# Patient Record
Sex: Female | Born: 1950 | Race: White | Hispanic: No | Smoking: Never smoker
Health system: Southern US, Community
[De-identification: ages and names within clinical notes are randomized; demographics above are authoritative.]

## PROBLEM LIST (undated history)

## (undated) DIAGNOSIS — R296 Repeated falls: Secondary | ICD-10-CM

## (undated) DIAGNOSIS — R413 Other amnesia: Secondary | ICD-10-CM

## (undated) DIAGNOSIS — F32A Depression, unspecified: Secondary | ICD-10-CM

## (undated) DIAGNOSIS — F329 Major depressive disorder, single episode, unspecified: Secondary | ICD-10-CM

## (undated) DIAGNOSIS — G4700 Insomnia, unspecified: Secondary | ICD-10-CM

## (undated) DIAGNOSIS — R404 Transient alteration of awareness: Secondary | ICD-10-CM

## (undated) DIAGNOSIS — K219 Gastro-esophageal reflux disease without esophagitis: Secondary | ICD-10-CM

## (undated) DIAGNOSIS — F419 Anxiety disorder, unspecified: Secondary | ICD-10-CM

## (undated) DIAGNOSIS — E7800 Pure hypercholesterolemia, unspecified: Secondary | ICD-10-CM

## (undated) DIAGNOSIS — M1990 Unspecified osteoarthritis, unspecified site: Secondary | ICD-10-CM

## (undated) HISTORY — DX: Major depressive disorder, single episode, unspecified: F32.9

## (undated) HISTORY — PX: COSMETIC SURGERY: SHX468

## (undated) HISTORY — DX: Other amnesia: R41.3

## (undated) HISTORY — DX: Unspecified osteoarthritis, unspecified site: M19.90

## (undated) HISTORY — DX: Transient alteration of awareness: R40.4

## (undated) HISTORY — PX: OTHER SURGICAL HISTORY: SHX169

## (undated) HISTORY — DX: Anxiety disorder, unspecified: F41.9

## (undated) HISTORY — DX: Gastro-esophageal reflux disease without esophagitis: K21.9

## (undated) HISTORY — DX: Repeated falls: R29.6

---

## 2001-02-11 ENCOUNTER — Other Ambulatory Visit: Admission: RE | Admit: 2001-02-11 | Discharge: 2001-02-11 | Payer: Self-pay | Admitting: Family Medicine

## 2001-08-01 ENCOUNTER — Encounter: Admission: RE | Admit: 2001-08-01 | Discharge: 2001-08-15 | Payer: Self-pay | Admitting: Orthopedic Surgery

## 2002-05-21 ENCOUNTER — Other Ambulatory Visit: Admission: RE | Admit: 2002-05-21 | Discharge: 2002-05-21 | Payer: Self-pay | Admitting: Family Medicine

## 2003-07-05 ENCOUNTER — Ambulatory Visit (HOSPITAL_BASED_OUTPATIENT_CLINIC_OR_DEPARTMENT_OTHER): Admission: RE | Admit: 2003-07-05 | Discharge: 2003-07-05 | Payer: Self-pay | Admitting: Family Medicine

## 2003-07-07 ENCOUNTER — Other Ambulatory Visit: Admission: RE | Admit: 2003-07-07 | Discharge: 2003-07-07 | Payer: Self-pay | Admitting: Family Medicine

## 2004-04-11 ENCOUNTER — Inpatient Hospital Stay (HOSPITAL_COMMUNITY): Admission: EM | Admit: 2004-04-11 | Discharge: 2004-04-14 | Payer: Self-pay | Admitting: Emergency Medicine

## 2006-12-13 ENCOUNTER — Inpatient Hospital Stay (HOSPITAL_COMMUNITY): Admission: EM | Admit: 2006-12-13 | Discharge: 2006-12-18 | Payer: Self-pay | Admitting: Emergency Medicine

## 2007-09-01 ENCOUNTER — Emergency Department (HOSPITAL_COMMUNITY): Admission: EM | Admit: 2007-09-01 | Discharge: 2007-09-01 | Payer: Self-pay | Admitting: Emergency Medicine

## 2007-11-25 ENCOUNTER — Emergency Department (HOSPITAL_COMMUNITY): Admission: EM | Admit: 2007-11-25 | Discharge: 2007-11-25 | Payer: Self-pay | Admitting: Emergency Medicine

## 2008-02-11 ENCOUNTER — Ambulatory Visit (HOSPITAL_COMMUNITY): Admission: RE | Admit: 2008-02-11 | Discharge: 2008-02-11 | Payer: Self-pay | Admitting: Family Medicine

## 2009-01-30 ENCOUNTER — Emergency Department (HOSPITAL_COMMUNITY): Admission: EM | Admit: 2009-01-30 | Discharge: 2009-01-30 | Payer: Self-pay | Admitting: Emergency Medicine

## 2010-01-12 ENCOUNTER — Encounter: Payer: Self-pay | Admitting: Internal Medicine

## 2010-01-18 ENCOUNTER — Encounter: Payer: Self-pay | Admitting: Internal Medicine

## 2010-02-08 ENCOUNTER — Emergency Department (HOSPITAL_COMMUNITY): Admission: EM | Admit: 2010-02-08 | Discharge: 2010-02-09 | Payer: Self-pay | Admitting: Emergency Medicine

## 2010-03-02 ENCOUNTER — Emergency Department (HOSPITAL_COMMUNITY): Admission: EM | Admit: 2010-03-02 | Discharge: 2010-03-02 | Payer: Self-pay | Admitting: Emergency Medicine

## 2010-03-13 DIAGNOSIS — R269 Unspecified abnormalities of gait and mobility: Secondary | ICD-10-CM

## 2010-03-13 DIAGNOSIS — G4733 Obstructive sleep apnea (adult) (pediatric): Secondary | ICD-10-CM

## 2010-03-13 DIAGNOSIS — IMO0001 Reserved for inherently not codable concepts without codable children: Secondary | ICD-10-CM

## 2010-03-13 DIAGNOSIS — E785 Hyperlipidemia, unspecified: Secondary | ICD-10-CM

## 2010-03-13 DIAGNOSIS — E7800 Pure hypercholesterolemia, unspecified: Secondary | ICD-10-CM

## 2010-03-13 DIAGNOSIS — Z8659 Personal history of other mental and behavioral disorders: Secondary | ICD-10-CM

## 2010-03-13 DIAGNOSIS — K219 Gastro-esophageal reflux disease without esophagitis: Secondary | ICD-10-CM

## 2010-03-13 DIAGNOSIS — F32A Depression, unspecified: Secondary | ICD-10-CM | POA: Insufficient documentation

## 2010-03-13 DIAGNOSIS — I959 Hypotension, unspecified: Secondary | ICD-10-CM | POA: Insufficient documentation

## 2010-03-13 DIAGNOSIS — R0789 Other chest pain: Secondary | ICD-10-CM | POA: Insufficient documentation

## 2010-03-13 DIAGNOSIS — R55 Syncope and collapse: Secondary | ICD-10-CM

## 2010-03-13 DIAGNOSIS — B1910 Unspecified viral hepatitis B without hepatic coma: Secondary | ICD-10-CM | POA: Insufficient documentation

## 2010-04-05 ENCOUNTER — Ambulatory Visit: Payer: Self-pay | Admitting: Internal Medicine

## 2010-05-25 ENCOUNTER — Encounter: Payer: Self-pay | Admitting: Internal Medicine

## 2010-06-25 ENCOUNTER — Emergency Department (HOSPITAL_COMMUNITY): Admission: EM | Admit: 2010-06-25 | Discharge: 2010-06-25 | Payer: Self-pay | Admitting: Emergency Medicine

## 2010-08-11 ENCOUNTER — Ambulatory Visit: Payer: Self-pay | Admitting: Psychology

## 2011-01-18 NOTE — Letter (Signed)
Summary: [facility] Office Note  [facility] Office Note   Imported By: Roderic Ovens 04/18/2010 15:12:14  _____________________________________________________________________  External Attachment:    Type:   Image     Comment:   External Document

## 2011-01-18 NOTE — Letter (Signed)
Summary: [facility] Office Note  [facility] Office Note   Imported By: Roderic Ovens 04/24/2010 12:55:11  _____________________________________________________________________  External Attachment:    Type:   Image     Comment:   External Document

## 2011-01-18 NOTE — Assessment & Plan Note (Signed)
Summary: nep/POTS disease/jml   Visit Type:  Initial Consult Primary Provider:  Kristopher Oppenheim, MD   History of Present Illness: Kendra Simmons is seen at the request of Dr. love because of recurrent falls.  She has a past medical history notable for panic attacks and depression. About 3 years ago she started having falls. Difficulty associated with injury. They're associated with a recognizable albeit very brief prodrome on most but not all occasions. She characterizes this as a wobble. Once she has fallen she is apparently if she has not injured herself able to quickly get herself up. Occasionally she has had difficulty standing after a fall. She denies at the phenomenon of diaphoresis nausea or flushing. She does have early a.m. orthostatic intolerance. She has dizziness after taking her shower which he does on the seat. She does not notice any change in her orthostatic tolerance or propensity to fall based on ambient temperature.  She has had no cardiac evaluation to date. She has seen Dr. love on multiple occasions; MRI reports are negative  Current Medications (verified): 1)  Fluoxetine Hcl 40 Mg Caps (Fluoxetine Hcl) .... Take One Tablet By Mouth Once Daily. 2)  Lorazepam 1 Mg Tabs (Lorazepam) .[name].. 1 Tab Three Times A Day 3)  Imipramine Hcl 25 Mg Tabs (Imipramine Hcl) .... 4 Tablets Once Daily 4)  Lipitor 80 Mg Tabs (Atorvastatin Calcium) .... Take One Tablet By Mouth Daily. 5)  Omeprazole 20 Mg Cpdr (Omeprazole) .[name].. 1 Tablet Two Times A Day 6)  Abilify 5 Mg Tabs (Aripiprazole) .[name].. 1 Tab At Bedtime  Allergies (verified): No Known Drug Allergies  Past History:  Past Medical History: Last updated: 03/13/2010 Current Problems:  CHEST DISCOMFORT (ICD-786.59) DYSLIPIDEMIA (ICD-272.4) HYPOTENSION (ICD-458.9) HEPATITIS B (ICD-070.30) GERD (ICD-530.81) SLEEP APNEA, OBSTRUCTIVE (ICD-327.23) HYPERCHOLESTEROLEMIA (ICD-272.0) GAIT DISTURBANCE (ICD-781.2) SYNCOPE  (ICD-780.2) DEPRESSION, HX OF (ICD-V11.8) MYALGIA (ICD-729.1)  Vital Signs:  Patient profile:   60 year old female Height:      64 inches Weight:      171 pounds BMI:     29.46 Pulse rate:   91 / minute Pulse (ortho):   100 / minute BP standing:   87 / 61  Vitals Entered By: Laurance Flatten CMA (April 05, 2010 2:57 PM)  Serial Vital Signs/Assessments:  Time      Position  BP       Pulse  Resp  Temp     By           Lying RA  96/71    86                    Jewel Hardy CMA           Sitting   104/73   96                    Jewel Hardy CMA           Standing  87/61    100                   Jewel Hardy CMA           Standing  100/76   108                   Jewel Hardy CMA           Standing  84/74    100  Jewel Hardy CMA  Comments: dizziness with reading #3 By: Laurance Flatten CMA    Physical Exam  General:  Well developed, well nourished, in no acute distress. Head:  normal HEENT Neck:  supple without thyromegaly carotid sinus massage was negative. Carotids were brisk and without bruits Chest Wall:  no deformities or breast masses noted Lungs:  clear Heart:  regular rate and rhythm without murmurs or gallops Abdomen:  soft nontender without hepatomegaly or midline pulsation Msk:  Back normal, normal gait. Muscle strength and tone normal. she is wearing a ankle splint on her right side and a bandage on her left knee Pulses:  distal pulses are intact Extremities:  there is no clubbing cyanosis or edema Neurologic:   alert and oriented x3 grossly norma   n o nystagmus Skin:  skin warm and dry with excoriations as noted Psych:  depressed affect, anxious, easily distracted, and poor concentration.     EKG  Procedure date:  04/05/2010  Findings:      Sinus rhtyhm  .15/10/48 ow normal  Impression & Recommendations:  Problem # 1:  SYNCOPE (ICD-780.2) The patient has recurrent falls. They occur with little warning. They're associated with some residual  orthostatic intolerance that is not predictable. The possibilities whether these are cardiovascular needs to be excluded. Her lipid cardiogram is normal making ventricular arrhythmias exceedingly unlikely. Supraventricular arrhythmias might be triggering; heart block is also unlikely based on normal intervals. An event recorder we'll also clarify this.  Hypotension maybe another trigger. He gets a dull Crowell uprights orthostasis is certainly a consideration. There is some evidence today of orthostatic intolerance based on her heart rate increases great 2 standing and her blood pressure falling about 10-15 mm. However, this is not clear enough to make a diagnosis. Tilt table testing may be useful. We will make sure however that she does not have structural disease prior to this as that would demand a different approach. Will obtain a 2-D ultrasound to look at that and then he was tilt table testing. Carotid sinus massage would be repeated at that time; I will note that was normal today lying flat. Orders: EKG w/ Interpretation (93000) Echocardiogram (Echo) Event (Event)  Problem # 2:  DEPRESSION, HX OF (ICD-V11.8) for depression history is important. There is clear data is just that there is interaction between falls there neurally mediated and depression/anxiety. Aggressive attention to this is necessary. I suggest that she follow up with Dr. Betti Cruz for further help in this.  Problem # 3:  CHEST DISCOMFORT (ICD-786.59) She has one episode of chest discomfort. We'll hold off at this point on further evaluation of coronary perfusion in the absence of objective data from her ultrasound.  Patient Instructions: 1)  Your physician has requested that you have an echocardiogram.  Echocardiography is a painless test that uses sound waves to create images of your heart. It provides your doctor with information about the size and shape of your heart and how well your heart's chambers and valves are working.   This procedure takes approximately one hour. There are no restrictions for this procedure. 2)  Your physician has recommended that you wear an event monitor.  Event monitors are medical devices that record the heart's electrical activity. Doctors most often use these monitors to diagnose arrhythmias. Arrhythmias are problems with the speed or rhythm of the heartbeat. The monitor is a small, portable device. You can wear one while you do your normal daily activities. This is usually used to diagnose  what is causing palpitations/syncope (passing out). 3)  Your physician recommends that you schedule a follow-up appointment in: 6 weeks with Dr Graciela Husbands  Appended Document: [facility]     Primary Provider:  Kristopher Oppenheim, MD   History of Present Illness: Dr Alena Bills accompanying records were reviewed  Allergies: No Known Drug Allergies  Past History:  Past Medical History: Current Problems:  recurrent fall/syncope DYSLIPIDEMIA (ICD-272.4)  HEPATITIS B (ICD-070.30) GERD (ICD-530.81) SLEEP APNEA, OBSTRUCTIVE (ICD-327.23)  GAIT DISTURBANCE (ICD-781.2) DEPRESSION, HX OF (ICD-V11.8) MYALGIA (ICD-729.1) B12 deficiency

## 2011-01-18 NOTE — Letter (Signed)
Summary: [facility]  [facility] Note   Imported By: Roderic Ovens 04/24/2010 12:56:40  _____________________________________________________________________  External Attachment:    Type:   Image     Comment:   External Document

## 2011-01-18 NOTE — Letter (Signed)
Summary: [facility] Office Visit Note  [facility] Office Visit Note   Imported By: Roderic Ovens 06/10/2010 11:05:50  _____________________________________________________________________  External Attachment:    Type:   Image     Comment:   External Document

## 2011-03-07 LAB — URINALYSIS, ROUTINE W REFLEX MICROSCOPIC
Bilirubin Urine: NEGATIVE
Ketones, ur: NEGATIVE mg/dL
Nitrite: NEGATIVE
Protein, ur: NEGATIVE mg/dL
Specific Gravity, Urine: 1.017 (ref 1.005–1.030)
Urobilinogen, UA: 0.2 mg/dL (ref 0.0–1.0)

## 2011-03-07 LAB — RAPID URINE DRUG SCREEN, HOSP PERFORMED: Barbiturates: NOT DETECTED

## 2011-03-07 LAB — DIFFERENTIAL
Basophils Absolute: 0 10*3/uL (ref 0.0–0.1)
Basophils Relative: 0 % (ref 0–1)
Eosinophils Absolute: 0 10*3/uL (ref 0.0–0.7)
Eosinophils Relative: 0 % (ref 0–5)
Neutrophils Relative %: 78 % — ABNORMAL HIGH (ref 43–77)

## 2011-03-07 LAB — URINE MICROSCOPIC-ADD ON

## 2011-03-07 LAB — COMPREHENSIVE METABOLIC PANEL
AST: 25 U/L (ref 0–37)
Albumin: 4.1 g/dL (ref 3.5–5.2)
BUN: 13 mg/dL (ref 6–23)
Calcium: 9.8 mg/dL (ref 8.4–10.5)
Creatinine, Ser: 0.83 mg/dL (ref 0.4–1.2)
GFR calc Af Amer: >60 mL/min (ref >60)
Glucose, Bld: 120 mg/dL — ABNORMAL HIGH (ref 70–99)
Potassium: 3.7 mEq/L (ref 3.5–5.1)
Sodium: 140 mEq/L (ref 135–145)

## 2011-03-07 LAB — CBC
Platelets: 261 10*3/uL (ref 150–400)
RDW: 13.7 % (ref 11.5–15.5)

## 2011-04-03 LAB — DIFFERENTIAL
Basophils Absolute: 0 10*3/uL (ref 0.0–0.1)
Eosinophils Absolute: 0.1 10*3/uL (ref 0.0–0.7)
Eosinophils Relative: 2 % (ref 0–5)
Lymphocytes Relative: 36 % (ref 12–46)
Lymphs Abs: 2.1 10*3/uL (ref 0.7–4.0)
Monocytes Absolute: 0.5 10*3/uL (ref 0.1–1.0)

## 2011-04-03 LAB — URINE MICROSCOPIC-ADD ON

## 2011-04-03 LAB — CBC
HCT: 39.8 % (ref 36.0–46.0)
Hemoglobin: 13.5 g/dL (ref 12.0–15.0)
MCV: 94.2 fL (ref 78.0–100.0)
Platelets: 162 10*3/uL (ref 150–400)
RDW: 13.7 % (ref 11.5–15.5)

## 2011-04-03 LAB — URINALYSIS, ROUTINE W REFLEX MICROSCOPIC
Ketones, ur: NEGATIVE mg/dL
Protein, ur: NEGATIVE mg/dL
Urobilinogen, UA: 0.2 mg/dL (ref 0.0–1.0)

## 2011-04-03 LAB — RAPID STREP SCREEN (MED CTR MEBANE ONLY): Streptococcus, Group A Screen (Direct): NEGATIVE

## 2011-04-03 LAB — POCT CARDIAC MARKERS
CKMB, poc: <1 ng/mL — ABNORMAL LOW (ref 1.0–8.0)
Troponin i, poc: <0.05 ng/mL (ref 0.00–0.09)

## 2011-04-03 LAB — MONONUCLEOSIS SCREEN: Mono Screen: NEGATIVE

## 2011-05-04 NOTE — H&P (Signed)
NAME:  Kendra Simmons, Kendra Simmons                      ACCOUNT NO.:  [id number]   MEDICAL RECORD NO.:  [id number]                   PATIENT TYPE:  EMS   LOCATION:  [facility]                                 FACILITY:  [facility]   PHYSICIAN:  Hettie Holstein, D.O.                 DATE OF BIRTH:  [birth year]   DATE OF ADMISSION:  04/11/2004  DATE OF DISCHARGE:                                HISTORY & PHYSICAL   PRIMARY CARE PHYSICIAN:  Dr. Renaye Rakers.   CHIEF COMPLAINT:  Chest pressure.   HISTORY OF PRESENT ILLNESS:  This is a 60 year old female without known  coronary artery disease who has hypercholesterolemia, is sedentary and  obese, who has a history of chronic dyspnea on exertion for quite some time,  who around 1 a.m. today felt some intermittent pressure in her chest, vague  in description.  She stated she had some pain in her neck as well as a  headache, accompanied by shortness of breath.  She denied any diaphoresis.  She was at rest when this occurred.  She stated that she was trying to take  a nap.  Around 5 p.m., she developed a sensation of someone sitting on her  chest.  This lasted approximately 5 minutes, again accompanied by shortness  of breath without diaphoresis or radiation to the back, neck or arm.  She  dispatched EMS and was given a baby aspirin.  He symptoms resolved, and she  presented to the emergency department where she was found to be  hemodynamically stable and chest pain free.  Her EKG did reveal some  nonspecific findings.  There were some small inferior Q waves and some  inferior T wave flattening.  Point of care's and chest x-ray are not  available for review as they have not been done at this time.   PAST MEDICAL HISTORY:  1. Hypercholesterolemia.  She has been on medications for several years and     reports having some difficulty with adequate control.  She has a history     of obstructive sleep apnea and has been on CPAP at home.  She also has a     history of  cervical disk herniation due to a horse accident in the     distant past.  She has a history of cesarean section and early menopause.     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  No specific therapies for this     were noted.  2. She has a history of depression and anxiety treated by psychiatrist, Dr.     Betti Cruz for the past three years.   MEDICATIONS:  Note that the medications are obtained from the patient.  She  states she feels the doses are correct; however, she uses [facility] on [address] alternatively.  They can be contacted as well as Dr.  Renaye Rakers to  confirm her dosages and frequencies.  1. She states that she takes Ativan p.r.n.  2. Seroquel 100 mg in the morning and 200 mg a night.  3. Celexa 30 mg daily.  4. Ambien 20 mg q.h.s.  5. Lipitor 40 mg p.o. daily.  6. Aspirin 81 mg daily.  7. Prevacid 30 mg daily.  8. Omeprazole 125 mg p.o. q.h.s.   ALLERGIES:  She denies any drug allergies.   SOCIAL HISTORY:  She is an Astronomer. on disability secondary to depression.  She  is a lifetime nonsmoker.  She only reports occasional alcohol.  She is  divorced and lives alone with her two cats.   FAMILY HISTORY:  Significant for father who died at age 20 of TB and  emphysema.  Mother had been diagnosed with breast cancer and cervical cancer  in the past; however, she is 63 and alive and well. As far as preventative  care, the patient states that she has not has a colonoscopy in the past.  She is up to date with regards to her mammograms.  She states that she has  these every two years.  She is not up to date with regard to her flu vaccine  or Pneumovax.   REVIEW OF SYSTEMS:  The patient denies nausea, vomiting, diarrhea, denies  hematemesis, hematochezia, melena, or coffee-ground emesis or dark, tarry  stool.  No anorexia.  She does complain of headache that is similar to the  one that she presents with today that  originates in her neck.  She denies  any abdominal pain, no dysuria.  She has no swelling or calf tenderness that  she reports.   PHYSICAL EXAMINATION:  VITAL SIGNS:  The patient's blood pressure is 137/85,  heart rate 90.  She was afebrile with a temperature of 97.7, heart rate  90's.  O2 saturation 95% on room air.  GENERAL:  Alert, mild-to-moderately obese 60 year old female, in no acute  distress.  NECK:  Supple, nontender, no palpable mass or thyromegaly.  CHEST:  Auscultation of her chest revealed her lungs to be clear  bilaterally.  CARDIOVASCULAR:  Exam revealed a normal S1 and S2.  No pronounced JVD.  No  S3 or S4.  ABDOMEN:  Soft, nontender, no palpable hepatosplenomegaly.  EXTREMITIES:  Revealed trace ankle edema with symmetrical peripheral pulses  bilaterally.  There is no calf tenderness.  NEUROLOGIC:  Exam noted her to be euthymic and affect was stable.   LABORATORY DATA:  Sodium 139, potassium 3.8, chloride 106, cO2 29, BUN 12,  creatinine 0.6.  This was obtained from the emergency room ISTAT.  Her pH  was 7.39, pCO2 was 37.   EKG revealed normal sinus rhythm with inferior small Q's with inferior T-  wave flattening.  Other laboratory data is pending at the time, including  chest x-ray and other lab studies.   IMPRESSION:  1. Chest pain, atypical.  We are going to rule out acute myocardial     infarction.  She does have some risk factors including age, sex,     sedentary lifestyle, and hyperlipidemia.  We are going to admit her, rule     out acute ischemic event.  Consider cardiac catheterization or stress for     further risk evaluation.  2. Hypercholesterolemia.  We are going to check a fasting lipid study and     continue her cholesterol medications as she was at home.  3. Obstructive sleep apnea.  We will attempt  to initiate her q.h.s. CPAP as     she was at home.  4. Obesity.  Lifestyle modifications. 5. Intrinsic platelet disorder.  Uncertain the details  regarding this     diagnosis.  However, we will check routine PT and PT-INR studies.  6. Early menopause.  7. Diagnosis of depression and anxiety.   PLAN:  1. We are going to admit [facility] to a telemetry floor as above and rule out     acute ischemic event.  2. She is acquainted with Dr. Antoine Poche and [facility], and has     requested consultation with Dr. Antoine Poche     for cardiology evaluation.  At the time, we will check routine labs and     plan to contact Dr. Antoine Poche and [facility] in the morning unless     she develops some acute symptomatology overnight, or any EKG changes.                                                Hettie Holstein, D.O.    ESS/MEDQ  D:  04/11/2004  T:  04/12/2004  Job:  [id number]   cc:   Renaye Rakers, M.D.  [phone] [address]  [place]  [place] [address]  Fax: [phone]

## 2011-05-04 NOTE — Consult Note (Signed)
NAME:  Kendra Simmons, Kendra Simmons                      ACCOUNT NO.:  [id number]   MEDICAL RECORD NO.:  [id number]                   PATIENT TYPE:  INP   LOCATION:  6529                                 FACILITY:  [facility]   PHYSICIAN:  Rollene Rotunda, M.D.                DATE OF BIRTH:  January 06, 1951   DATE OF CONSULTATION:  DATE OF DISCHARGE:                                   CONSULTATION   CONSULTING PHYSICIAN:  Rollene Rotunda, M.D.   PRIMARY CARE PHYSICIAN:  Renaye Rakers, M.D.   REFERRING PHYSICIAN:  Elliot Cousin, M.D.   REASON FOR CONSULTATION:  Evaluate patient with chest pain.   HISTORY OF PRESENT ILLNESS:  The patient is a pleasant 60 year old white  female with no prior cardiac history.  She was admitted with chest  discomfort.  This started yesterday.  It was proceeded by a sense of gloom  and anxiety.  It was left sided, 5/10 in intensity.  It was unlike her  previous reflux.  It would last for a few minutes and go away but would  return.  She had some mild nausea.  She had a little discomfort in her neck  and shoulders.  She could not bring this on with exertion, though she is  relatively sedentary.  She did not have nausea, vomiting, or diaphoresis.  She does not recall having symptoms exactly like this before.  She  intermittently has chest pain that she relates to anxiety or reflux.  She  has chronic shortness of breath climbing a flight of stairs.  She has  chronically decreased exercise tolerance.  She did have a sore throat  recently and a headache yesterday.  In the emergency room, she was not noted  to have acute EKG changes.  Enzymes have been negative.   PAST MEDICAL HISTORY:  1. Hyperlipidemia x 16 years.  2. Obstructive sleep apnea.  3. Unclear platelet disorder.  4. Depression/anxiety.  5. Hepatitis B.  6. Endometriosis.   PAST SURGICAL HISTORY:  1. C-section.  2. Status post rhinoplasty.  3. Removal of a pilonidal cyst.   ALLERGIES:  None.   MEDICATIONS:  1. Ativan.  2. Seroquel 100 mg q.a.m., 200 mg q.p.m.  3. Celexa 30 mg every day.  4. Ambien 20 mg q.h.s.  5. Lipitor 40 mg q.h.s.  6. Aspirin 81 mg every day.  7. Prevacid 30 mg every day.  8. __________  125 mg q.h.s.   SOCIAL HISTORY:  The patient is an Charity fundraiser.  She is on disability secondary to  depression.  She had three children.   FAMILY HISTORY:  Noncontributory for early coronary artery disease.   REVIEW OF SYSTEMS:  As stated in the HPI, and positive for nocturia,  lightheadedness, weight gain slowly recently.   PHYSICAL EXAMINATION:  GENERAL:  The patient is in no distress.  VITAL SIGNS:  Blood pressure 92/54, heart rate 75 and regular.  HEENT:  Eyes unremarkable.  Pupils equal, round, and reactive to light.  Fundi not visualized.  Oral mucosa unremarkable.  NECK:  No jugular venous distention.  Wave form within normal limits.  Carotid upstroke brisk and symmetric.  No bruits, no thyromegaly.  LYMPHATICS:  No cervical, axillary, inguinal adenopathy.  LUNGS:  Clear to auscultation bilaterally.  BACK:  No costovertebral angle tenderness.  CHEST:  Unremarkable.  HEART:  PMI not displaced or sustained.  S1 and S2 within normal limits.  No  S3, no S4, no murmurs.  ABDOMEN:  Obese.  Positive bowel sounds, normal in frequency and pitch.  No  bruits, no rebound, no guarding.  No midline pulsatile mass.  No  hepatomegaly, no splenomegaly.  SKIN:  No rashes, __________  .  EXTREMITIES:  Pulses 2+ throughout.  No edema.  No cyanosis.  No clubbing.  NEUROLOGIC:  Oriented to person, place and time.  Cranial nerves II-XII  grossly intact.  Motor grossly intact.   EKG:  Sinus rhythm, inferior Q waves of questionable significance, axis  within normal limits, intervals within normal limits, no acute ST-T wave  changes.   ASSESSMENT/PLAN:  1. Chest discomfort.  The patient has chest discomfort that does have some     features suggestive of unstable angina.  She has ruled out for  myocardial     infarction.  She does have cardiovascular risk factors.  I think her pre-     test probability for obstructive coronary disease is somewhat moderate.     I think she should be observed in the hospital until a stress test is     performed.  She will have an adenosine Cardiolite.  2. Risk reduction.  She did get a lipid profile which demonstrates excellent     response to statins, and she will continue this.   FOLLOW UP:  Will be based on the results of the stress test.                                               Rollene Rotunda, M.D.   JH/MEDQ  D:  04/12/2004  T:  04/13/2004  Job:  [id number]   cc:   Renaye Rakers, M.D.  [phone] [address]  [place]  [place] [address]  Fax: [phone]

## 2011-05-04 NOTE — H&P (Signed)
Kendra Simmons, Kendra Simmons NO.:  [id number]   MEDICAL RECORD NO.:  [id number]          PATIENT TYPE:  EMS   LOCATION:  ED                           FACILITY:  [facility]   PHYSICIAN:  Isidor Holts, M.D.  DATE OF BIRTH:  07/05/1951   DATE OF ADMISSION:  12/13/2006  DATE OF DISCHARGE:                              HISTORY & PHYSICAL   PRIMARY MEDICAL DOCTOR:  Dr. Wilford Sports, [place].   CHIEF COMPLAINT:  Recurrent falls, left elbow fracture.   HISTORY OF PRESENT ILLNESS:  This is a 60 year old female.  For past  medical history, see below.  According to the patient, she has over the  past 1 year had recurrent falls, which she states total about 10 in  number and over the period from December 10, 2006 has fallen at least  twice.  She states that she never blacked out during these episodes, and  they were not preceded either by chest pain, shortness of breath,  palpitations or dizziness.  Her family strongly feel that her  medications may be contributory and seemed quite concerned about this.  The patient was accompanied by two of her sisters to the emergency  department.  As she states, her daughter was visiting during the holiday  season and was staying with her mother who lives next door.  She had  gone over also, to stay with her mother, and had gone to bed quite late  on December 12, 2006.  She was still in bed at about 12:30 p.m. on  December 13, 2006 when she noticed that there was a pillow on the floor.  As she reached down to try to get hold of the pillow, she slid from the  bed and fell, banging her left arm on the night stand.  She had severe  pain, yelled out, her mother came by, saw her and called her sisters who  then called the EMS.  On arrival at [facility], she  had an x-ray which demonstrated a left distal humeral shaft fracture.  Apparently, ED MD discussed the patient's injury with Dr. Allie Bossier, orthopedic surgeon,  who recommended support sling and  subsequent followup in his office.   PAST MEDICAL HISTORY:  1. Dyslipidemia.  2. Anxiety/depression.  The patient has psychiatric follow-up with Dr.      Betti Cruz.  3. Obstructive sleep apnea syndrome, on nocturnal CPAP.  4. History of cervical disk herniation, secondary to fall from horse      in the past.  Since then, has had chronic neck/shoulder pains and      headaches.  5. Prior history of endometriosis, status post laparoscopies.  6. History of cesarean section.  7. Status post rhinoplasty.  8. Status post removal of pilonidal cyst.  9. History of intrinsic platelet disorder, evaluated by hematologist      at [facility], but is on no specific treatment.  10.GERD.  11.History of hepatitis B.   MEDICATION HISTORY:  1. Ativan 0.5 mg p.o. t.i.d. (the patient takes 1 pill daily).  2. Celexa 40 mg p.o. daily.  3. Darvocet-N 100 one  p.o. p.r.n. q.8h.  4. Imipramine (50 mg) 4 pills p.o. q.h.s.  5. Omeprazole 40 mg p.o. b.i.d.  6. Seroquel 200 mg p.o. q.a.m. and 400 mg p.o. q.p.m.  7. Ambien 10 mg p.o. q.h.s.   ALLERGIES:  No known drug allergies.   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  Nonsmoker, nondrinker.  Has no history  of drug abuse, is divorced.  She has three children, including one son  and two daughters, one of her daughters is retarded.   FAMILY HISTORY:  Significant for father who died at age 45 years of  tuberculosis/emphysema.  Mother is alive and well, but has a history of  previous breast cancer and cervical cancer.  Family history is otherwise  noncontributory.   PHYSICAL EXAMINATION:  VITALS:  Temperature 97.9, pulse 89 per minute  regular, respiratory rate 18, BP 115/62 mmHg, pulse oximeter 97% on room  air.  The patient does not appear to be in obvious acute discomfort  alert, communicative, not short of breath at rest, left arm is encased  in a sling.  HEENT:   No clinical pallor, no jaundice, no conjunctival injection.  Throat is clear.  Neck is supple.  JVP not seen.  No palpable  lymphadenopathy.  No palpable goiter.  CHEST:  Clear clinically to auscultation.  No wheezes or crackles.  HEART:  Sounds 1 and 2 heard, normal regular, no murmurs.  ABDOMEN:  Full, soft and nontender.  There is no palpable organomegaly.  No palpable masses.  Normal bowel sounds.  EXTREMITIES:  Lower extremity examination:  No pitting edema.  Palpable  peripheral pulses.  Fading bruises are noted on both shins anteriorly.  Upper extremity examination:  Right upper extremity is unremarkable.  Left upper extremity:  Extensive bruising is noted left upper arm which  also looks somewhat swollen.  Cast has been applied and the patient's  arm is in a triangular sling.  CENTRAL NERVOUS SYSTEM:  No focal neurologic deficit on gross  examination.  MUSCULOSKELETAL SYSTEM:  As above, otherwise not formally examined.   INVESTIGATIONS:  CBC:  WBC 6.2, hemoglobin 11.0, hematocrit 32.6,  platelets 169.  Electrolytes:  Sodium 137, potassium 3.9, chloride 109,  CO2 24, BUN 12, creatinine 0.7, glucose 116.  X-ray left are December 13, 2006 shows distal femoral shaft fracture with  displacement/overriding.  Head CT scan dated December 13, 2006 shows no  acute findings.  There was an increase degree of atrophy since April 14, 2004 also bilateral frontal subdural hygromas and atrophy without mass  effect.   ASSESSMENT AND PLAN:  1. Recurrent falls, unclear etiology:  The patient denies syncope,      dizziness or palpitations.  We shall admit the patient, check      orthostatics and commence intravenous fluids.  For completeness, we      will check serum cortisol levels.  It is likely that this may be      medication related, as some of patient's psychotropic medications,     i.e. Seroquel and Imipramine, are known to cause orthostatic      hypotension.  We will therefore decrease  Seroquel to 100 mg p.o.      q.a.m. and 200 mg p.o. q.p.m. and also decrease imipramine to 100      mg p.o. q.h.s.   1. Left arm fracture:  The patient already has a cast and an arm  sling.  We shall continue analgesics and reconsult Dr. Magnus Ivan,      orthopedic surgeon.   1. Obstructive sleep apnea on home CPAP which she uses intermittently:      Appears clinically stable at present.   1. Mood:  This appears stable.   1. History of hepatitis B:  We shall check liver function tests.   1. History of gastroesophageal reflux disease:  We shall continue      proton pump inhibitor.   Further management will depend on clinical course.      Isidor Holts, M.D.  Electronically Signed     CO/MEDQ  D:  12/13/2006  T:  12/14/2006  Job:  [id number]

## 2011-05-04 NOTE — H&P (Signed)
NAME:  Kendra Simmons, Kendra Simmons                      ACCOUNT NO.:  [id number]   MEDICAL RECORD NO.:  [id number]                   PATIENT TYPE:  EMS   LOCATION:  MAJO                                 FACILITY:  [facility]   PHYSICIAN:  Hettie Holstein, D.O.                 DATE OF BIRTH:  1951/07/18   DATE OF ADMISSION:  04/11/2004  DATE OF DISCHARGE:                                HISTORY & PHYSICAL   No dictation with this job.                                                Hettie Holstein, D.O.    ESS/MEDQ  D:  04/11/2004  T:  04/12/2004  Job:  [id number]

## 2011-05-04 NOTE — Discharge Summary (Signed)
NAME:  Kendra Simmons, Kendra Simmons                      ACCOUNT NO.:  [id number]   MEDICAL RECORD NO.:  [id number]                   PATIENT TYPE:  INP   LOCATION:  6529                                 FACILITY:  [facility]   PHYSICIAN:  Elliot Cousin, M.D.                 DATE OF BIRTH:  [birth year]   DATE OF ADMISSION:  04/11/2004  DATE OF DISCHARGE:  04/14/2004                                 DISCHARGE SUMMARY   INCOMPLETE:   DISCHARGE DIAGNOSES:  1. Chest pain/pressure.     a. Cardiac enzymes negative x3 sets.     b. Cardiolite stress test revealed a normal study.  Ejection fraction        77%.  2. Acute-on-chronic headache.  3. Hyperlipidemia.   SECONDARY DISCHARGE DIAGNOSES:  1. Obstructive sleep apnea on CPAP at home.  2. 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  No     specific therapies were administered.  3. History of depression and anxiety treated by a psychiatrist, Dr. Betti Cruz,     for the past 3 years.  4. History of cervical disk herniation secondary to a horse accident in the     distant past.  5. Chronic insomnia.  6. Obesity.  7. History of hepatitis B.  8. History of endometriosis.  9. Status post cesarean section in the past.  10.      Status post rhinoplasty in the past.  11.      Status post removal of a pilonidal cyst in the past.   DISCHARGE MEDICATIONS:  1. Aspirin 81 mg daily.  2. Prevacid 30 mg daily.  3. Seroquel 100 mg in the morning, and 200 mg at night.  4. Celexa 30 mg daily.  5. Lipitor 40 mg daily.  6. Ambien 20 mg q.h.s.  7. Ativan 1 mg p.r.n.   DISCHARGE DISPOSITION:  The patient was discharged to home on April 14, 2004  in improved and stable condition.  She has a follow up appointment scheduled  with her primary care physician, Dr. Parke Simmers, in approximately 2 weeks.   CONSULTATIONS:  Rollene Rotunda, M.D., cardiologist.   PROCEDURES PERFORMED:  1. CT scan of the head without contrast.  No acute abnormalities.  2.  Cardiolite stress test on April 13, 2004.  The results as above.   HISTORY OF PRESENT ILLNESS:  Ms. Apodaca is a 60 year old lady with no  known history of coronary artery disease, who presented to the emergency  department on April 11, 2004 with intermittent chest pressure, vague in  description.  The patient also stated that she had some pain in her neck, as  well as a headache, accompanied by shortness of breath.  She denied any  associated diaphoresis or nausea.  She called EMS subsequently.  She was  given a baby aspirin by the EMT.  Her symptoms resolved almost completely  when she presented to the ED.  The patient, however,  was admitted for  further evaluation and management, given her history of obesity and  hyperlipidemia.   HOSPITAL COURSE:  #1.  CHEST PAIN/CHEST PRESSURE.  The   Dictation ended the this point.                                                Elliot Cousin, M.D.    DF/MEDQ  D:  04/14/2004  T:  04/15/2004  Job:  [id number]   cc:   Renaye Rakers, M.D.  [phone] [address]  [place]  [place] [address]  Fax: [phone]

## 2011-05-04 NOTE — Discharge Summary (Signed)
NAME:  Kendra Simmons, Kendra Simmons                      ACCOUNT NO.:  [id number]   MEDICAL RECORD NO.:  [id number]                   PATIENT TYPE:  INP   LOCATION:  6529                                 FACILITY:  [facility]   PHYSICIAN:  Elliot Cousin, M.D.                 DATE OF BIRTH:  06/06/1951   DATE OF ADMISSION:  04/11/2004  DATE OF DISCHARGE:  04/14/2004                                 DISCHARGE SUMMARY   CONTINUATION:   HOSPITAL COURSE:  #1.  CHEST PAIN/CHEST PRESSURE.  The patient's EKG on  admission revealed nonspecific findings with some inferior T wave flattening  and some questionable Q waves in the inferior and septal leads.  The  patient's chest x-ray revealed no acute disease.  She was evaluated further  with cardiac enzyme markers every 8 hours x3.  The cardiac markers were  completely within normal limits.  She was started on treatment empirically  with Lopressor 25 mg b.i.d., and a baby aspirin per day.  Lipitor at 40 mg  q.h.s. was continued.  A fasting lipid panel was ordered, as well, during  the hospital course.  The fasting lipid panel revealed a total cholesterol  of 188, triglycerides of 191, HDL cholesterol of 63, and an LDL cholesterol  of 87.  A homocystine level was also assessed, and found to be within normal  limits at 12.94.  Her thyroid function was assessed with a TSH, which was  within normal limits at 3.255.  She was treated as needed with sublingual  nitroglycerin, and as-needed morphine and oxycodone.  For further  evaluation, a cardiology consult was requested by the cardiologist, Dr.  Antoine Poche.  He recommended that the patient undergo a Cardiolite stress test.  The Cardiolite stress test was performed on April 13, 2004.  The stress test  was complete within normal limits.  The ejection fraction was estimated at  77%.  The patient's chest discomfort eventually subsided.  The etiology of  the chest pain is uncertain; however, it appears to have been  noncardiac in  origin.  She also was continued on treatment with Protonix 40 mg daily;  however, it was increased briefly to 40 mg b.i.d.  The patient takes  Prevacid 30 mg daily in the outpatient setting.  The chest pain could be  secondary to gastroesophageal reflux disease, musculoskeletal pain, as well  as cervical disk herniation, which tends to radiate to the chest wall.  The  patient was advised to take as-needed Tylenol, and as-needed Ultram at 1  tablet q.6h. for pain.  The Lopressor was eventually discontinued.  The  patient has no history of hypertension.  #2.  HEADACHE.  The patient complained of a headache that radiated from the  posterior portion of her head to the frontal area.  As stated before, she  has a history of a herniated disk in her cervical spine.  The headache was  managed with as-needed  oxycodone and morphine.  The headache could have also  been secondary to the Lopressor therapy and as-needed nitroglycerin.  Her  headache did subside during the hospital course.  She was neurologically  intact during the entire hospital course.   DISCHARGE LABORATORIES:  Troponin I less than 0.01.  CK 64, CK-MB 1.5.  WBC  6.7, hemoglobin 13.0, hematocrit 38.2, platelets 182.  Sodium 139, potassium  3.7, chloride 106, CO2 of 26, glucose 117, BUN 11, creatinine 0.9.  Total  bilirubin 0.5.  Alkaline phosphatase 85, SGOT 23, SGPT 17, total protein  6.8.  Albumin 3.9, calcium 9.2.                                                Elliot Cousin, M.D.    DF/MEDQ  D:  04/14/2004  T:  04/15/2004  Job:  [id number]   cc:   Renaye Rakers, M.D.  [phone] [address], [address]  [place]  [place] [address]  Fax: [phone]

## 2011-05-04 NOTE — Discharge Summary (Signed)
NAMEHAIZEL, GATCHELL            ACCOUNT NO.:  [id number]   MEDICAL RECORD NO.:  [id number]          PATIENT TYPE:  INP   LOCATION:  1322                         [facility]ACILITY:  [facility]   PHYSICIAN:  Lonia Blood, M.D.      DATE OF BIRTH:  June 06, 1951   DATE OF ADMISSION:  12/13/2006  DATE OF DISCHARGE:  12/18/2006                               DISCHARGE SUMMARY   PRIMARY CARE PHYSICIAN:  Dr. Clarene Duke at [facility], [place].   DISCHARGE DIAGNOSES:  1. Left humeral fracture.  2. Recurrent falls of unknown etiology.  3. Depression/anxiety with some panic disorders.  4. Obstructive sleep apnea.  5. History of hepatitis B.  6. Recurrent spasms.  7. Urinary frequency transiently.  8. Chronic insomnia.   DISCHARGE MEDICATIONS:  Seroquel 100 mg q.a.m. and 200 mg q.h.s., Celexa  40 mg daily, Tofranil 200 mg q.h.s., omeprazole 40 mg b.i.d., Ambien 5  meters q.h.s., Ativan 0.5 mg t.i.d. p.r.n.,Robaxin 500 mg q.6h p.r.n.   DISPOSITION:  The patient is going home with Home Health PT/OT.  She has  had issues of falls, which appears to be more gait-related, probably  from her medications.  She has been advised to use a cane as he works  around so she can break a fall if she is falling. She is also to have a  followup with Dr. Magnus Ivan, orthopedic surgeon in about a week.   PROCEDURES PERFORMED THIS ADMISSION:  Head CT without contrast on  December28 that showed no acute intracranial abnormality, some bifrontal  subdural hygromas but no underlying mass effect.  Left elbow x-ray  showed comminuted displaced fracture of the distal left humerus shaft.   CONSULTATIONS:  Dr. Magnus Ivan, orthopedic surgery.   BRIEF HISTORY AND PHYSICAL:  In short, this is a 60 year old female that  presented with history of falls on December 13, 2006.  The patient fell  on her left elbow and came in with pain.  In the ED she was found to  have comminuted fracture of the distal radius and was subsequently  admitted.   She is on polypharmacy, a lot of sedatives as indicated  above.  Family believes it has something to do with her falls.  She has  had apparently about 10 falls within the past 1 year.  She was  subsequently admitted for further workup of her falls.   HOSPITAL COURSE:  1. Comminuted fracture.  The patient was admitted and orthopedic      surgery was consulted.  She was seen by Dr. Magnus Ivan.  She had a      sling placed with immobilization.  Further follow-up with Dr.      Magnus Ivan has been set up in about a week after discharge.      Initially, the patient was have some rehab in the hospital, due to      her worry of home setting.  The patient and family, however,      decided to go to have the patient at home with Home Health.  She      lives with her mother and her other family member live  next to her.      Pain control is the main issue at this point.  For pain management,      will continue to give her some Percocet that she gets currently in      the hospital.  2. Recurrent falls.  Again this is felt to be due to polypharmacy.      All her medications have been halved since admission, however, she      has had some elements of panic attack, so we are resuming  her      Tofranil in dosing, but the rest of the medications will continue      where they are, at a lower dose.  The patient has not fallen in the      hospital, but will get Home Health PT/OT. Hopefully, that will      help.  3. Gastroesophageal reflux disease .  Again the patient continued with      her omeprazole while in the hospital.  4. Depression/anxiety. The patient is anxious but also was having some      panic attacks apparently.  Will continue with her medications and,      hopefully, she will have outpatient follow-up, and if further      adjustments are required, she will get it at that point.  Otherwise      the rest of her medical problems have been stable during this      hospitalization.      Lonia Blood, M.D.  Electronically Signed     LG/MEDQ  D:  12/18/2006  T:  12/18/2006  Job:  [id number]

## 2011-07-20 ENCOUNTER — Other Ambulatory Visit: Payer: Self-pay | Admitting: Neurology

## 2011-07-20 DIAGNOSIS — W19XXXA Unspecified fall, initial encounter: Secondary | ICD-10-CM

## 2011-07-20 DIAGNOSIS — R443 Hallucinations, unspecified: Secondary | ICD-10-CM

## 2011-07-20 DIAGNOSIS — H8110 Benign paroxysmal vertigo, unspecified ear: Secondary | ICD-10-CM

## 2011-07-20 DIAGNOSIS — F3289 Other specified depressive episodes: Secondary | ICD-10-CM

## 2011-07-20 DIAGNOSIS — F068 Other specified mental disorders due to known physiological condition: Secondary | ICD-10-CM

## 2011-07-20 DIAGNOSIS — F329 Major depressive disorder, single episode, unspecified: Secondary | ICD-10-CM

## 2011-07-26 ENCOUNTER — Ambulatory Visit (HOSPITAL_COMMUNITY): Payer: Medicare Other | Admitting: Physician Assistant

## 2011-07-26 DIAGNOSIS — F339 Major depressive disorder, recurrent, unspecified: Secondary | ICD-10-CM

## 2011-07-27 ENCOUNTER — Other Ambulatory Visit: Payer: Self-pay

## 2011-07-28 ENCOUNTER — Other Ambulatory Visit: Payer: Self-pay

## 2011-08-15 ENCOUNTER — Ambulatory Visit
Admission: RE | Admit: 2011-08-15 | Discharge: 2011-08-15 | Disposition: A | Discharge status: Home / Self Care | Payer: Medicare Other | Source: Ambulatory Visit | Attending: Neurology | Admitting: Neurology

## 2011-08-15 DIAGNOSIS — W19XXXA Unspecified fall, initial encounter: Secondary | ICD-10-CM

## 2011-08-15 DIAGNOSIS — F329 Major depressive disorder, single episode, unspecified: Secondary | ICD-10-CM

## 2011-08-15 DIAGNOSIS — R443 Hallucinations, unspecified: Secondary | ICD-10-CM

## 2011-08-15 DIAGNOSIS — F068 Other specified mental disorders due to known physiological condition: Secondary | ICD-10-CM

## 2011-08-15 DIAGNOSIS — F3289 Other specified depressive episodes: Secondary | ICD-10-CM

## 2011-08-15 DIAGNOSIS — H8110 Benign paroxysmal vertigo, unspecified ear: Secondary | ICD-10-CM

## 2011-09-06 ENCOUNTER — Encounter (HOSPITAL_COMMUNITY): Payer: Medicare Other | Admitting: Physician Assistant

## 2011-09-20 ENCOUNTER — Encounter (INDEPENDENT_AMBULATORY_CARE_PROVIDER_SITE_OTHER): Payer: Medicare Other | Admitting: Physician Assistant

## 2011-09-20 DIAGNOSIS — F068 Other specified mental disorders due to known physiological condition: Secondary | ICD-10-CM

## 2011-09-27 LAB — CBC
HCT: 34.3 — ABNORMAL LOW
Hemoglobin: 12
MCV: 93.2
WBC: 5.2

## 2011-09-27 LAB — DIFFERENTIAL
Eosinophils Absolute: 0.1
Eosinophils Relative: 1
Lymphocytes Relative: 29
Lymphs Abs: 1.5
Monocytes Absolute: 0.3
Monocytes Relative: 6

## 2011-09-27 LAB — BASIC METABOLIC PANEL
Chloride: 103
GFR calc non Af Amer: >60
Potassium: 3.9
Sodium: 135

## 2011-10-18 ENCOUNTER — Encounter (HOSPITAL_COMMUNITY): Payer: Self-pay | Admitting: Physician Assistant

## 2011-10-18 ENCOUNTER — Ambulatory Visit (INDEPENDENT_AMBULATORY_CARE_PROVIDER_SITE_OTHER): Payer: Medicare Other | Admitting: Physician Assistant

## 2011-10-18 VITALS — BP 137/92 | HR 94 | Temp 96.9°F | Wt 205.4 lb

## 2011-10-18 DIAGNOSIS — F0390 Unspecified dementia without behavioral disturbance: Secondary | ICD-10-CM

## 2011-10-18 NOTE — Progress Notes (Signed)
The patient is in with her sister to follow up on her use of Risperdal for her symptoms of dementia.  Patient notes that she did better on Abilify, but could not afford it.  She had started the Patient Assistance program but felt that she made too much money.  However since speaking with her it is discovered that she has been paying rent each month for her daughter who is a full time college student, and she has a daughter who is in a group home as well.  These were not included in her taxes when she filed.  Upon refiguring her income for a 2 person family she does meet eligibility for patient assistance for Abilify.  Pt. Is alert and oriented x 3, casually dressed.  She is cooperative and pleasant. Mood is calm, affect is congruent. Denies SI/HI, but does report hearing music, and knows that it was not possible it was real.  This has only happened a couple of times. Thought process is normal and linear.  Insight is fair, judgement is good. No visual hallucinations.  Says her memory is pretty good.

## 2011-10-18 NOTE — Patient Instructions (Addendum)
Pt. And sister will complete the PA forms for her to restart the Abilify.  They will send them in again and forward all questions from the company to me.  She will continue all medication as written until we can get a response from the [employer]. Follow up in 6-8 weeks. Pt. Did not need any refills at this time.

## 2011-11-13 ENCOUNTER — Telehealth (HOSPITAL_COMMUNITY): Payer: Self-pay | Admitting: *Deleted

## 2011-11-13 DIAGNOSIS — F329 Major depressive disorder, single episode, unspecified: Secondary | ICD-10-CM

## 2011-11-13 MED ORDER — RISPERIDONE 1 MG PO TABS: ORAL_TABLET | ORAL | Status: DC

## 2011-11-13 NOTE — Telephone Encounter (Signed)
Called to state that she wakes during the night between 3 and 4 am, since  Risperdal  increased to 4mg  by PCP to help with sleep. Wakes in pain with raised and and painful red splotches on her legs, flushed and achy all over. Wonders if Risperdal is doing it? N. Mashburn PA reviewed pt concerns, chart and meds. Requested patient be called and told to stop Risperdal 4mg , and decrease to 3 mg at night, beginning tonight. Patient will need Risperdal 1mg  sent to pharmacy to create 3mg  dose. Instructed patient to call in one week to update progress. Call PRN for further problems.

## 2011-11-15 ENCOUNTER — Telehealth (HOSPITAL_COMMUNITY): Payer: Self-pay | Admitting: *Deleted

## 2011-11-15 NOTE — Telephone Encounter (Signed)
See note in Telephone Call comments regarding side effects experienced with Risperdal.

## 2011-11-19 ENCOUNTER — Telehealth (HOSPITAL_COMMUNITY): Payer: Self-pay | Admitting: *Deleted

## 2011-11-19 NOTE — Telephone Encounter (Signed)
See Telephone Call notes.

## 2011-12-03 ENCOUNTER — Ambulatory Visit (HOSPITAL_COMMUNITY): Payer: Medicare Other | Admitting: Physician Assistant

## 2011-12-06 ENCOUNTER — Encounter (HOSPITAL_COMMUNITY): Payer: Medicare Other | Admitting: Physician Assistant

## 2011-12-20 DIAGNOSIS — R5383 Other fatigue: Secondary | ICD-10-CM | POA: Diagnosis not present

## 2011-12-20 DIAGNOSIS — R5381 Other malaise: Secondary | ICD-10-CM | POA: Diagnosis not present

## 2011-12-20 DIAGNOSIS — F070 Personality change due to known physiological condition: Secondary | ICD-10-CM | POA: Diagnosis not present

## 2011-12-20 DIAGNOSIS — B0229 Other postherpetic nervous system involvement: Secondary | ICD-10-CM | POA: Diagnosis not present

## 2011-12-20 DIAGNOSIS — R55 Syncope and collapse: Secondary | ICD-10-CM | POA: Diagnosis not present

## 2012-01-14 ENCOUNTER — Ambulatory Visit (HOSPITAL_COMMUNITY): Payer: Medicare Other | Admitting: Physician Assistant

## 2012-01-22 ENCOUNTER — Ambulatory Visit (HOSPITAL_COMMUNITY): Payer: Medicare Other | Admitting: Physician Assistant

## 2012-02-11 ENCOUNTER — Ambulatory Visit (INDEPENDENT_AMBULATORY_CARE_PROVIDER_SITE_OTHER): Payer: Medicare Other | Admitting: Physician Assistant

## 2012-02-11 DIAGNOSIS — F0390 Unspecified dementia without behavioral disturbance: Secondary | ICD-10-CM

## 2012-02-11 DIAGNOSIS — F0630 Mood disorder due to known physiological condition, unspecified: Secondary | ICD-10-CM | POA: Diagnosis not present

## 2012-02-11 MED ORDER — ARIPIPRAZOLE 5 MG PO TABS: 5.0000 mg | ORAL_TABLET | Freq: Every day | ORAL | Status: DC

## 2012-02-11 NOTE — Progress Notes (Signed)
    Health Follow-up Outpatient Visit  ASHANNA HEINSOHN [birth year]  Date: 02/11/12   Subjective: Kendra Simmons presents today to followup on her medications prescribed for her mood disturbance associated with dementia. She reports that she was able to receive medications through the pharmaceutical company, but she needs to reapply in this new calendar year. Her neurologist, Dr. Sandria Manly, has her taking Namenda, which seems to be helping her memory significantly. She also reports that her primary care physician is prescribing Xanax, Prozac, and amitriptyline. She complains that since being on the Abilify and Namenda she has gained about 30 pounds. She endorses that her sleep is good. She denies any suicidal or homicidal ideation. She denies any auditory or visual hallucinations.  There were no vitals filed for this visit.  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 She is encouraged to begin an exercise routine with the goal of working up to 30 minutes daily 5 days per week. She is also encouraged to decrease her calorie intake, which can be addressed by stopping the [employer]'s, of which he drinks 220 ounce bottles daily, for a total of 500 calories. We will followup in one month.  Parth Mccormac, PA

## 2012-02-20 ENCOUNTER — Other Ambulatory Visit (HOSPITAL_COMMUNITY): Payer: Self-pay | Admitting: *Deleted

## 2012-02-20 DIAGNOSIS — F0630 Mood disorder due to known physiological condition, unspecified: Secondary | ICD-10-CM

## 2012-02-20 MED ORDER — ARIPIPRAZOLE 10 MG PO TABS: 5.0000 mg | ORAL_TABLET | Freq: Every day | ORAL | Status: DC

## 2012-03-10 ENCOUNTER — Ambulatory Visit (HOSPITAL_COMMUNITY): Payer: Medicare Other | Admitting: Physician Assistant

## 2012-03-20 DIAGNOSIS — F068 Other specified mental disorders due to known physiological condition: Secondary | ICD-10-CM | POA: Diagnosis not present

## 2012-03-25 ENCOUNTER — Telehealth (HOSPITAL_COMMUNITY): Payer: Self-pay | Admitting: *Deleted

## 2012-03-25 ENCOUNTER — Encounter (HOSPITAL_COMMUNITY): Payer: Self-pay | Admitting: *Deleted

## 2012-03-25 NOTE — Telephone Encounter (Signed)
Massie Maroon- requests calls regarding medication administration be directed to her as she assists patient  Home [phone], [place] [phone]

## 2012-03-25 NOTE — Progress Notes (Signed)
02/04/12 Patient applied for [employer] Patient Assistance Program for Abilify. 2/5 and 2/14 Contacted [employer] Patient Assistance Program  for update on application process on behalf of patient. Representative Lamonica informed this Clinical research associate that before medications can be dispensed, patient will need to spend               3% of annual income on prescriptions.She recommended patient get copy of prescription refill record from pharmacy to determine how much has been spent.  Left msg for patient to call the office to discuss. 03/18/12 Contacted patient and left message. Patient returned call, and explained process to patient.Patient asked that we talk to her sister Carney Bern, as she handles her medications.Patient states she has enough Abilify samples to last until the first of next week,  4 week supply of Abilify 5mg  requested from [facility]. 03/20/12 Contacted by patient's sister, Jean,(Has POA- left message on VM) to discuss PAP requirements. 03/24/12 Tilford Pillar to discuss details of program requirements. Carney Bern asked that all future calls regarding patient's medications be directed to her as she manages patient meds.  Contact information for sister verified in EPIC

## 2012-03-29 DIAGNOSIS — B029 Zoster without complications: Secondary | ICD-10-CM | POA: Diagnosis not present

## 2012-04-01 ENCOUNTER — Emergency Department (HOSPITAL_COMMUNITY): Payer: Medicare Other

## 2012-04-01 ENCOUNTER — Encounter (HOSPITAL_COMMUNITY): Payer: Self-pay

## 2012-04-01 ENCOUNTER — Emergency Department (HOSPITAL_COMMUNITY)
Admission: EM | Admit: 2012-04-01 | Discharge: 2012-04-02 | Disposition: A | Discharge status: Home / Self Care | Payer: Medicare Other | Attending: Emergency Medicine | Admitting: Emergency Medicine

## 2012-04-01 DIAGNOSIS — M7989 Other specified soft tissue disorders: Secondary | ICD-10-CM | POA: Diagnosis not present

## 2012-04-01 DIAGNOSIS — S6990XA Unspecified injury of unspecified wrist, hand and finger(s), initial encounter: Secondary | ICD-10-CM | POA: Insufficient documentation

## 2012-04-01 DIAGNOSIS — W19XXXA Unspecified fall, initial encounter: Secondary | ICD-10-CM | POA: Insufficient documentation

## 2012-04-01 DIAGNOSIS — M25539 Pain in unspecified wrist: Secondary | ICD-10-CM | POA: Insufficient documentation

## 2012-04-01 DIAGNOSIS — F3130 Bipolar disorder, current episode depressed, mild or moderate severity, unspecified: Secondary | ICD-10-CM | POA: Diagnosis not present

## 2012-04-01 DIAGNOSIS — S52599A Other fractures of lower end of unspecified radius, initial encounter for closed fracture: Secondary | ICD-10-CM | POA: Diagnosis not present

## 2012-04-01 DIAGNOSIS — E7800 Pure hypercholesterolemia, unspecified: Secondary | ICD-10-CM | POA: Diagnosis not present

## 2012-04-01 DIAGNOSIS — M25529 Pain in unspecified elbow: Secondary | ICD-10-CM | POA: Diagnosis not present

## 2012-04-01 DIAGNOSIS — S62109A Fracture of unspecified carpal bone, unspecified wrist, initial encounter for closed fracture: Secondary | ICD-10-CM | POA: Diagnosis not present

## 2012-04-01 DIAGNOSIS — S52209A Unspecified fracture of shaft of unspecified ulna, initial encounter for closed fracture: Secondary | ICD-10-CM | POA: Diagnosis not present

## 2012-04-01 DIAGNOSIS — Z79899 Other long term (current) drug therapy: Secondary | ICD-10-CM | POA: Diagnosis not present

## 2012-04-01 DIAGNOSIS — S5000XA Contusion of unspecified elbow, initial encounter: Secondary | ICD-10-CM | POA: Diagnosis not present

## 2012-04-01 DIAGNOSIS — S59909A Unspecified injury of unspecified elbow, initial encounter: Secondary | ICD-10-CM | POA: Diagnosis not present

## 2012-04-01 HISTORY — DX: Major depressive disorder, single episode, unspecified: F32.9

## 2012-04-01 HISTORY — DX: Depression, unspecified: F32.A

## 2012-04-01 HISTORY — DX: Pure hypercholesterolemia, unspecified: E78.00

## 2012-04-01 NOTE — ED Notes (Signed)
Pt has shingles on her face she is currently being treated for

## 2012-04-01 NOTE — ED Notes (Signed)
Pt fell and landed on her left side, she complains of left wrist pain that radiates to her elbow

## 2012-04-02 MED ORDER — OXYCODONE-ACETAMINOPHEN 5-325 MG PO TABS: 2.0000 | ORAL_TABLET | ORAL | Status: AC | PRN

## 2012-04-02 MED ORDER — OXYCODONE-ACETAMINOPHEN 5-325 MG PO TABS
1.0000 | ORAL_TABLET | Freq: Once | ORAL | Status: AC
  Administered 2012-04-02: 1 via ORAL
  Filled 2012-04-02: qty 1

## 2012-04-02 MED ORDER — IBUPROFEN 200 MG PO TABS
600.0000 mg | ORAL_TABLET | Freq: Once | ORAL | Status: AC
  Administered 2012-04-02: 600 mg via ORAL
  Filled 2012-04-02: qty 3

## 2012-04-02 NOTE — ED Provider Notes (Signed)
History     CSN: [id number]  Arrival date & time 04/01/12  2122   First MD Initiated Contact with Patient 04/02/12 0029      Chief Complaint  Patient presents with  . Wrist Injury  . Arm Injury    (Consider location/radiation/quality/duration/timing/severity/associated sxs/prior treatment) Patient is a 61 y.o. female presenting with wrist injury and arm injury. The history is provided by the patient. No language interpreter was used.  Wrist Injury  The incident occurred 1 to 2 hours ago. The pain is at a severity of 8/10. The pain is moderate. The pain has been constant since the incident. Pertinent negatives include no fever. She reports no foreign bodies present. The symptoms are aggravated by movement. She has tried ice for the symptoms. The treatment provided mild relief.  Arm Injury     Past Medical History  Diagnosis Date  . High cholesterol   . Depression   . Bipolar 1 disorder     History reviewed. No pertinent past surgical history.  History reviewed. No pertinent family history.  History  Substance Use Topics  . Smoking status: Never Smoker   . Smokeless tobacco: Not on file  . Alcohol Use: No    OB History    Grav Para Term Preterm Abortions TAB SAB Ect Mult Living                  Review of Systems  Constitutional: Negative.  Negative for fever.  HENT: Negative.   Eyes: Negative.   Respiratory: Negative.   Cardiovascular: Negative.   Gastrointestinal: Negative.   Musculoskeletal:       R wrist and elbow pain  Neurological: Negative.   Psychiatric/Behavioral: Negative.   All other systems reviewed and are negative.    Allergies  Review of patient's allergies indicates no known allergies.  Home Medications   Current Outpatient Rx  Name Route Sig Dispense Refill  . ALPRAZOLAM 1 MG PO TABS Oral Take 1 mg by mouth at bedtime as needed.    [name] AMITRIPTYLINE HCL 100 MG PO TABS Oral Take 100 mg by mouth at bedtime.    . ARIPIPRAZOLE 10 MG PO  TABS Oral Take 0.5 tablets (5 mg total) by mouth daily. 21 tablet 0  . CYANOCOBALAMIN 500 MCG/0.1ML NA SOLN Intramuscular Inject 3 mLs into the muscle once a week.      [name] FLUOXETINE HCL 40 MG PO CAPS Oral Take 40 mg by mouth daily.      . IMIPRAMINE HCL 25 MG PO TABS Oral Take 25 mg by mouth at bedtime. Pt. Takes 4 (25mg ) tablets at HS     . MEMANTINE HCL 10 MG PO TABS Oral Take 10 mg by mouth 2 (two) times daily.      [name] NIACIN ER 500 MG PO TBCR Oral Take 500 mg by mouth.      . OMEPRAZOLE 20 MG PO CPDR Oral Take 20 mg by mouth daily.      . OXYCODONE-ACETAMINOPHEN 5-325 MG PO TABS Oral Take 2 tablets by mouth every 4 (four) hours as needed for pain. 15 tablet 0  . PRAVASTATIN SODIUM 80 MG PO TABS Oral Take 80 mg by mouth daily.      [name] VITAMIN D (ERGOCALCIFEROL) 50000 UNITS PO CAPS Oral Take 50,000 Units by mouth every 7 (seven) days.        BP 127/86  Pulse 98  Temp(Src) 97.5 F (36.4 C) (Oral)  Resp 16  SpO2 97%  Physical Exam  Nursing note and vitals reviewed. Constitutional: She is oriented to person, place, and time. She appears well-developed and well-nourished.  HENT:  Head: Normocephalic and atraumatic.  Eyes: Conjunctivae and EOM are normal. Pupils are equal, round, and reactive to light.  Neck: Normal range of motion. Neck supple.  Cardiovascular: Normal rate, regular rhythm, normal heart sounds and intact distal pulses.  Exam reveals no gallop and no friction rub.   No murmur heard. Pulmonary/Chest: Effort normal and breath sounds normal.  Abdominal: Soft. Bowel sounds are normal.  Musculoskeletal: Normal range of motion. She exhibits edema and tenderness.       R wrist and elbow tenderness.  Good radial pulse edema with no deformity.  Moving fingers well.  Neurological: She is alert and oriented to person, place, and time. She has normal reflexes.  Skin: Skin is warm and dry.  Psychiatric: She has a normal mood and affect.    ED Course  Procedures (including critical  care time)  Labs Reviewed - No data to display Dg Elbow Complete Left  04/02/2012  *RADIOLOGY REPORT*  Clinical Data: Status post fall down steps; bruising at the radial aspect of the elbow.  LEFT ELBOW - COMPLETE 3+ VIEW  Comparison: Left elbow radiographs performed 12/13/2006  Findings: No new fractures are identified.  The radial head and olecranon appear grossly intact.  There is chronic deformity involving the distal humerus, reflecting remote injury.  No significant elbow joint effusion is identified.  No significant soft tissue abnormalities are characterized on radiograph.  IMPRESSION: No new fractures seen at the left elbow joint.  Original Report Authenticated By: Tonia Ghent, M.D.   Dg Wrist Complete Left  04/02/2012  *RADIOLOGY REPORT*  Clinical Data: Status post fall down steps; radial left wrist pain.  LEFT WRIST - COMPLETE 3+ VIEW  Comparison: None.  Findings: There is a mildly comminuted and mildly displaced fracture involving the ulnar and dorsal aspects of the distal radial metaphysis, with less prominent involvement of the radial aspect of the metaphysis.  There is extension to the radiocarpal joint; the majority of the distal radius demonstrates grossly normal alignment, though there appears to be some degree of step- off.  No additional fractures are seen.  Visualized joint spaces are otherwise preserved.  Mild soft tissue swelling is noted at the fracture site.  IMPRESSION: Mildly comminuted and mildly displaced fracture involving the ulnar and dorsal aspects of the distal radial metaphysis, with less prominent involvement of the radial aspect of the metaphysis.  The majority of the distal radius demonstrates grossly normal alignment, though there is some degree of step-off at the radiocarpal joint.  Original Report Authenticated By: Tonia Ghent, M.D.     1. Fracture of wrist       MDM  R wrist fx with sugar tong splint.  Call for appointment with Dr. Amanda Pea in am.  Mildly  displaced. Ice and elevate tonight.  Percocet for pain.         Remi Haggard, NP 04/02/12 1414

## 2012-04-02 NOTE — Discharge Instructions (Signed)
Ms Ortez to have a fractured left wrist tonight. We applied a splint in the ER for comfort. Use the sling as tolerated. He can follow Dr. Amanda Pea who is on-call for our hand surgeon. Or you can use the orthopedic of your choice. Take Percocet for pain do not drive with this medication. Can also take ibuprofen.  Percocet Tylenol in it so I would not take a bunch of Tylenol with Percocet. The ER for severe pain discolored fingers or any concerns.

## 2012-04-03 NOTE — ED Provider Notes (Signed)
Medical screening examination/treatment/procedure(s) were performed by non-physician practitioner and as supervising physician I was immediately available for consultation/collaboration.   Lyanne Co, MD 04/03/12 [phone]

## 2012-04-04 DIAGNOSIS — S52599A Other fractures of lower end of unspecified radius, initial encounter for closed fracture: Secondary | ICD-10-CM | POA: Diagnosis not present

## 2012-04-10 ENCOUNTER — Ambulatory Visit (HOSPITAL_COMMUNITY): Payer: Medicare Other | Admitting: Physician Assistant

## 2012-04-18 DIAGNOSIS — S52599A Other fractures of lower end of unspecified radius, initial encounter for closed fracture: Secondary | ICD-10-CM | POA: Diagnosis not present

## 2012-04-30 ENCOUNTER — Telehealth (HOSPITAL_COMMUNITY): Payer: Self-pay | Admitting: *Deleted

## 2012-04-30 DIAGNOSIS — F0630 Mood disorder due to known physiological condition, unspecified: Secondary | ICD-10-CM

## 2012-04-30 MED ORDER — ARIPIPRAZOLE 5 MG PO TABS: 5.0000 mg | ORAL_TABLET | Freq: Every day | ORAL | Status: DC

## 2012-04-30 MED ORDER — ARIPIPRAZOLE 10 MG PO TABS: 5.0000 mg | ORAL_TABLET | Freq: Every day | ORAL | Status: DC

## 2012-04-30 NOTE — Telephone Encounter (Signed)
Samples until meets required 3% spent for [facility] med program

## 2012-05-02 ENCOUNTER — Telehealth (HOSPITAL_COMMUNITY): Payer: Self-pay

## 2012-05-02 NOTE — Telephone Encounter (Signed)
11:24am 05/02/12 pt came by to pick up samples of abilify./sh

## 2012-05-07 DIAGNOSIS — S52599A Other fractures of lower end of unspecified radius, initial encounter for closed fracture: Secondary | ICD-10-CM | POA: Diagnosis not present

## 2012-05-22 ENCOUNTER — Ambulatory Visit (INDEPENDENT_AMBULATORY_CARE_PROVIDER_SITE_OTHER): Payer: Medicare Other | Admitting: Physician Assistant

## 2012-05-22 DIAGNOSIS — F0630 Mood disorder due to known physiological condition, unspecified: Secondary | ICD-10-CM | POA: Diagnosis not present

## 2012-05-22 MED ORDER — ARIPIPRAZOLE 10 MG PO TABS: 10.0000 mg | ORAL_TABLET | Freq: Every day | ORAL | Status: DC

## 2012-05-22 NOTE — Progress Notes (Signed)
   [facility] Follow-up Outpatient Visit  Kendra Simmons August 03, 1951  Date: 05/22/2012   Subjective: Kendra Simmons presents today to followup on medications prescribed for depression secondary to Kendra dementia. She has been taking the Abilify 5 mg daily and reports that she has some breakthrough depression. She has been dealing with a significant amount of stress recently do to Kendra Simmons illness, and a recent fractured wrist, as well as financial concerns. She is currently prescribed Elavil Prozac and Xanax by Kendra primary care provider, Kendra Simmons. She was recently approved for pharmaceutical assistance for Kendra Kendra Simmons which will help Kendra financial situation. She states that Kendra sleep is poor, although she takes 2 mg of Xanax at bedtime. She also states that Kendra appetite is good, and she expresses concerns about weight gain. She states that she is walking between Kendra Simmons's homes 2 times a week, which she describes as about a 20 minute walk one way. She has decreased the amount of Coca-Cola she is drinking, and is trying to drink less caffeine in an attempt to help Kendra sleep better. She reports that she has increased Kendra water intake. She denies any suicidal or homicidal ideation. She denies any auditory or visual hallucinations.  There were no vitals filed for this visit.  Mental Status Examination  Appearance: Well groomed and casually dressed Alert: Yes Attention: good  Cooperative: Yes Eye Contact: Good Speech: Clear, even, and coherent Psychomotor Activity: Normal Memory/Concentration: Intact Oriented: person, place, time/date and situation Mood: Euthymic Affect: Flat Thought Processes and Associations: Circumstantial Fund of Knowledge: Fair Thought Content: Normal Insight: Fair Judgement: Good  Diagnosis: Mood disorder associated with Kendra dementia  Treatment Plan: We'll increase Kendra Abilify to 10 mg daily. She will call for samples if needed. She will return for  followup in 6 weeks.  Kendra Roam, PA-C

## 2012-05-27 ENCOUNTER — Telehealth (HOSPITAL_COMMUNITY): Payer: Self-pay

## 2012-05-27 DIAGNOSIS — R55 Syncope and collapse: Secondary | ICD-10-CM | POA: Diagnosis not present

## 2012-05-27 DIAGNOSIS — R5381 Other malaise: Secondary | ICD-10-CM | POA: Diagnosis not present

## 2012-05-27 DIAGNOSIS — R5383 Other fatigue: Secondary | ICD-10-CM | POA: Diagnosis not present

## 2012-05-27 DIAGNOSIS — F17200 Nicotine dependence, unspecified, uncomplicated: Secondary | ICD-10-CM | POA: Diagnosis not present

## 2012-05-27 DIAGNOSIS — E782 Mixed hyperlipidemia: Secondary | ICD-10-CM | POA: Diagnosis not present

## 2012-06-05 DIAGNOSIS — S52599A Other fractures of lower end of unspecified radius, initial encounter for closed fracture: Secondary | ICD-10-CM | POA: Diagnosis not present

## 2012-06-05 DIAGNOSIS — M6530 Trigger finger, unspecified finger: Secondary | ICD-10-CM | POA: Diagnosis not present

## 2012-06-11 ENCOUNTER — Telehealth (HOSPITAL_COMMUNITY): Payer: Self-pay

## 2012-06-12 ENCOUNTER — Telehealth (HOSPITAL_COMMUNITY): Payer: Self-pay

## 2012-06-12 NOTE — Telephone Encounter (Signed)
11:02AM 06/12/12 PATIENT CAME AND PICK-UP MEDICATION.Kendra Simmons

## 2012-06-20 ENCOUNTER — Other Ambulatory Visit (HOSPITAL_COMMUNITY): Payer: Self-pay | Admitting: Psychology

## 2012-06-20 DIAGNOSIS — F0630 Mood disorder due to known physiological condition, unspecified: Secondary | ICD-10-CM

## 2012-06-20 MED ORDER — ARIPIPRAZOLE 10 MG PO TABS: 10.0000 mg | ORAL_TABLET | Freq: Every day | ORAL | Status: DC

## 2012-07-09 ENCOUNTER — Ambulatory Visit (INDEPENDENT_AMBULATORY_CARE_PROVIDER_SITE_OTHER): Payer: Medicare Other | Admitting: Physician Assistant

## 2012-07-09 DIAGNOSIS — F331 Major depressive disorder, recurrent, moderate: Secondary | ICD-10-CM | POA: Diagnosis not present

## 2012-07-09 NOTE — Progress Notes (Signed)
   [facility] Follow-up Outpatient Visit  Kendra Simmons Jan 15, 1951  Date: 07/09/2012   Subjective: Kendra Simmons presents today to followup on her treatment for anxiety and depression. She actually reports that she has stopped drinking Coca-Cola for approximately 2 weeks now. She had been drinking 3 or 4 Cokes daily. She reports that she is still eating too much. She feels that her mood isn't "not as good as she would like it to be." She reports that she has been depressed, but she feels that it is situational. Her mother has been in the ICU on 2 occasions recently, and Winnifred Friar fears that she may not live much longer. She feels that the increased dose of Abilify has helped some, but she is having difficulty getting the medication due to its cost. We have followed through with getting her pharmaceutical assistance but that has not yet been approved. She reports that her neurologist is pleased with her progress, and he may change her diagnosis from dementia to a lesser memory disorder. She continues to get some exercise, but tends to eat unhealthy snacks. She denies any suicidal or homicidal ideation. She denies any auditory or visual hallucinations.  There were no vitals filed for this visit.  Mental Status Examination  Appearance: Well groomed and casually dressed Alert: Yes Attention: good  Cooperative: Yes Eye Contact: Good Speech: Clear and coherent Psychomotor Activity: Normal Memory/Concentration: Intact Oriented: person, place, time/date and situation Mood: Euthymic Affect: Appropriate Thought Processes and Associations: Linear and Logical Fund of Knowledge: Good Thought Content: Normal Insight: Good Judgement: Good  Diagnosis: Maj. depressive disorder, recurrent, moderate  Treatment Plan: We will continue her Abilify 10 mg daily, and continue with our request for pharmaceutical assistance. She is encouraged to continue to increase her exercise routine, and eat  healthier snacks. She will return for followup in 6 weeks.  Randol Zumstein, PA-C

## 2012-07-21 ENCOUNTER — Other Ambulatory Visit (HOSPITAL_COMMUNITY): Payer: Self-pay | Admitting: Family Medicine

## 2012-07-21 DIAGNOSIS — Z1231 Encounter for screening mammogram for malignant neoplasm of breast: Secondary | ICD-10-CM

## 2012-08-07 ENCOUNTER — Ambulatory Visit (HOSPITAL_COMMUNITY): Payer: Medicare Other | Attending: Family Medicine

## 2012-08-21 DIAGNOSIS — R5381 Other malaise: Secondary | ICD-10-CM | POA: Diagnosis not present

## 2012-08-21 DIAGNOSIS — F070 Personality change due to known physiological condition: Secondary | ICD-10-CM | POA: Diagnosis not present

## 2012-08-21 DIAGNOSIS — R5383 Other fatigue: Secondary | ICD-10-CM | POA: Diagnosis not present

## 2012-08-21 DIAGNOSIS — E782 Mixed hyperlipidemia: Secondary | ICD-10-CM | POA: Diagnosis not present

## 2012-08-25 ENCOUNTER — Ambulatory Visit (HOSPITAL_COMMUNITY): Admission: RE | Admit: 2012-08-25 | Payer: Medicare Other | Source: Ambulatory Visit

## 2012-08-31 ENCOUNTER — Ambulatory Visit: Payer: Medicare Other

## 2012-08-31 ENCOUNTER — Ambulatory Visit (INDEPENDENT_AMBULATORY_CARE_PROVIDER_SITE_OTHER): Payer: Medicare Other | Admitting: Family Medicine

## 2012-08-31 VITALS — BP 118/82 | HR 72 | Temp 97.4°F | Resp 18 | Ht 63.5 in | Wt 208.0 lb

## 2012-08-31 DIAGNOSIS — M545 Low back pain, unspecified: Secondary | ICD-10-CM | POA: Diagnosis not present

## 2012-08-31 DIAGNOSIS — S335XXA Sprain of ligaments of lumbar spine, initial encounter: Secondary | ICD-10-CM

## 2012-08-31 DIAGNOSIS — S39012A Strain of muscle, fascia and tendon of lower back, initial encounter: Secondary | ICD-10-CM

## 2012-08-31 MED ORDER — IBUPROFEN 600 MG PO TABS: 600.0000 mg | ORAL_TABLET | Freq: Three times a day (TID) | ORAL | Status: AC | PRN

## 2012-08-31 NOTE — Patient Instructions (Addendum)
1. Low back pain  DG Lumbar Spine 2-3 Views  2. Lumbar strain       Low Back Strain with Rehab A strain is an injury in which a tendon or muscle is torn. The muscles and tendons of the lower back are vulnerable to strains. However, these muscles and tendons are very strong and require a great force to be injured. Strains are classified into three categories. Grade 1 strains cause pain, but the tendon is not lengthened. Grade 2 strains include a lengthened ligament, due to the ligament being stretched or partially ruptured. With grade 2 strains there is still function, although the function may be decreased. Grade 3 strains involve a complete tear of the tendon or muscle, and function is usually impaired. SYMPTOMS   Pain in the lower back.   Pain that affects one side more than the other.   Pain that gets worse with movement and may be felt in the hip, buttocks, or back of the thigh.   Muscle spasms of the muscles in the back.   Swelling along the muscles of the back.   Loss of strength of the back muscles.   Crackling sound (crepitation) when the muscles are touched.  CAUSES  Lower back strains occur when a force is placed on the muscles or tendons that is greater than they can handle. Common causes of injury include:  Prolonged overuse of the muscle-tendon units in the lower back, usually from incorrect posture.   A single violent injury or force applied to the back.  RISK INCREASES WITH:  Sports that involve twisting forces on the spine or a lot of bending at the waist (football, rugby, weightlifting, bowling, golf, tennis, speed skating, racquetball, swimming, running, gymnastics, diving).   Poor strength and flexibility.   Failure to warm up properly before activity.   Family history of lower back pain or disk disorders.   Previous back injury or surgery (especially fusion).   Poor posture with lifting, especially heavy objects.   Prolonged sitting, especially with poor  posture.  PREVENTION   Learn and use proper posture when sitting or lifting (maintain proper posture when sitting, lift using the knees and legs, not at the waist).   Warm up and stretch properly before activity.   Allow for adequate recovery between workouts.   Maintain physical fitness:   Strength, flexibility, and endurance.   Cardiovascular fitness.  PROGNOSIS  If treated properly, lower back strains usually heal within 6 weeks. RELATED COMPLICATIONS   Recurring symptoms, resulting in a chronic problem.   Chronic inflammation, scarring, and partial muscle-tendon tear.   Delayed healing or resolution of symptoms.   Prolonged disability.  TREATMENT  Treatment first involves the use of ice and medicine, to reduce pain and inflammation. The use of strengthening and stretching exercises may help reduce pain with activity. These exercises may be performed at home or with a therapist. Severe injuries may require referral to a therapist for further evaluation and treatment, such as ultrasound. Your caregiver may advise that you wear a back brace or corset, to help reduce pain and discomfort. Often, prolonged bed rest results in greater harm then benefit. Corticosteroid injections may be recommended. However, these should be reserved for the most serious cases. It is important to avoid using your back when lifting objects. At night, sleep on your back on a firm mattress with a pillow placed under your knees. If non-surgical treatment is unsuccessful, surgery may be needed.  MEDICATION   If pain  medicine is needed, nonsteroidal anti-inflammatory medicines (aspirin and ibuprofen), or other minor pain relievers (acetaminophen), are often advised.   Do not take pain medicine for 7 days before surgery.   Prescription pain relievers may be given, if your caregiver thinks they are needed. Use only as directed and only as much as you need.   Ointments applied to the skin may be helpful.    Corticosteroid injections may be given by your caregiver. These injections should be reserved for the most serious cases, because they may only be given a certain number of times.  HEAT AND COLD  Cold treatment (icing) should be applied for 10 to 15 minutes every 2 to 3 hours for inflammation and pain, and immediately after activity that aggravates your symptoms. Use ice packs or an ice massage.   Heat treatment may be used before performing stretching and strengthening activities prescribed by your caregiver, physical therapist, or athletic trainer. Use a heat pack or a warm water soak.  SEEK MEDICAL CARE IF:   Symptoms get worse or do not improve in 2 to 4 weeks, despite treatment.   You develop numbness, weakness, or loss of bowel or bladder function.   New, unexplained symptoms develop. (Drugs used in treatment may produce side effects.)  EXERCISES  RANGE OF MOTION (ROM) AND STRETCHING EXERCISES - Low Back Strain Most people with lower back pain will find that their symptoms get worse with excessive bending forward (flexion) or arching at the lower back (extension). The exercises which will help resolve your symptoms will focus on the opposite motion.  Your physician, physical therapist or athletic trainer will help you determine which exercises will be most helpful to resolve your lower back pain. Do not complete any exercises without first consulting with your caregiver. Discontinue any exercises which make your symptoms worse until you speak to your caregiver.  If you have pain, numbness or tingling which travels down into your buttocks, leg or foot, the goal of the therapy is for these symptoms to move closer to your back and eventually resolve. Sometimes, these leg symptoms will get better, but your lower back pain may worsen. This is typically an indication of progress in your rehabilitation. Be very alert to any changes in your symptoms and the activities in which you participated in  the 24 hours prior to the change. Sharing this information with your caregiver will allow him/her to most efficiently treat your condition.  These exercises may help you when beginning to rehabilitate your injury. Your symptoms may resolve with or without further involvement from your physician, physical therapist or athletic trainer. While completing these exercises, remember:   Restoring tissue flexibility helps normal motion to return to the joints. This allows healthier, less painful movement and activity.   An effective stretch should be held for at least 30 seconds.   A stretch should never be painful. You should only feel a gentle lengthening or release in the stretched tissue.  FLEXION RANGE OF MOTION AND STRETCHING EXERCISES: STRETCH - Flexion, Single Knee to Chest   Lie on a firm bed or floor with both legs extended in front of you.   Keeping one leg in contact with the floor, bring your opposite knee to your chest. Hold your leg in place by either grabbing behind your thigh or at your knee.   Pull until you feel a gentle stretch in your lower back. Hold __________ seconds.   Slowly release your grasp and repeat the exercise with the opposite  side.  Repeat __________ times. Complete this exercise __________ times per day.  STRETCH - Flexion, Double Knee to Chest   Lie on a firm bed or floor with both legs extended in front of you.   Keeping one leg in contact with the floor, bring your opposite knee to your chest.   Tense your stomach muscles to support your back and then lift your other knee to your chest. Hold your legs in place by either grabbing behind your thighs or at your knees.   Pull both knees toward your chest until you feel a gentle stretch in your lower back. Hold __________ seconds.   Tense your stomach muscles and slowly return one leg at a time to the floor.  Repeat __________ times. Complete this exercise __________ times per day.  STRETCH - Low Trunk  Rotation  Lie on a firm bed or floor. Keeping your legs in front of you, bend your knees so they are both pointed toward the ceiling and your feet are flat on the floor.   Extend your arms out to the side. This will stabilize your upper body by keeping your shoulders in contact with the floor.   Gently and slowly drop both knees together to one side until you feel a gentle stretch in your lower back. Hold for __________ seconds.   Tense your stomach muscles to support your lower back as you bring your knees back to the starting position. Repeat the exercise to the other side.  Repeat __________ times. Complete this exercise __________ times per day  EXTENSION RANGE OF MOTION AND FLEXIBILITY EXERCISES: STRETCH - Extension, Prone on Elbows   Lie on your stomach on the floor, a bed will be too soft. Place your palms about shoulder width apart and at the height of your head.   Place your elbows under your shoulders. If this is too painful, stack pillows under your chest.   Allow your body to relax so that your hips drop lower and make contact more completely with the floor.   Hold this position for __________ seconds.   Slowly return to lying flat on the floor.  Repeat __________ times. Complete this exercise __________ times per day.  RANGE OF MOTION - Extension, Prone Press Ups  Lie on your stomach on the floor, a bed will be too soft. Place your palms about shoulder width apart and at the height of your head.   Keeping your back as relaxed as possible, slowly straighten your elbows while keeping your hips on the floor. You may adjust the placement of your hands to maximize your comfort. As you gain motion, your hands will come more underneath your shoulders.   Hold this position __________ seconds.   Slowly return to lying flat on the floor.  Repeat __________ times. Complete this exercise __________ times per day.  RANGE OF MOTION- Quadruped, Neutral Spine   Assume a hands and  knees position on a firm surface. Keep your hands under your shoulders and your knees under your hips. You may place padding under your knees for comfort.   Drop your head and point your tail bone toward the ground below you. This will round out your lower back like an angry cat. Hold this position for __________ seconds.   Slowly lift your head and release your tail bone so that your back sags into a large arch, like an old horse.   Hold this position for __________ seconds.   Repeat this until you feel limber  in your lower back.   Now, find your "sweet spot." This will be the most comfortable position somewhere between the two previous positions. This is your neutral spine. Once you have found this position, tense your stomach muscles to support your lower back.   Hold this position for __________ seconds.  Repeat __________ times. Complete this exercise __________ times per day.  STRENGTHENING EXERCISES - Low Back Strain These exercises may help you when beginning to rehabilitate your injury. These exercises should be done near your "sweet spot." This is the neutral, low-back arch, somewhere between fully rounded and fully arched, that is your least painful position. When performed in this safe range of motion, these exercises can be used for people who have either a flexion or extension based injury. These exercises may resolve your symptoms with or without further involvement from your physician, physical therapist or athletic trainer. While completing these exercises, remember:   Muscles can gain both the endurance and the strength needed for everyday activities through controlled exercises.   Complete these exercises as instructed by your physician, physical therapist or athletic trainer. Increase the resistance and repetitions only as guided.   You may experience muscle soreness or fatigue, but the pain or discomfort you are trying to eliminate should never worsen during these exercises.  If this pain does worsen, stop and make certain you are following the directions exactly. If the pain is still present after adjustments, discontinue the exercise until you can discuss the trouble with your caregiver.  STRENGTHENING - Deep Abdominals, Pelvic Tilt  Lie on a firm bed or floor. Keeping your legs in front of you, bend your knees so they are both pointed toward the ceiling and your feet are flat on the floor.   Tense your lower abdominal muscles to press your lower back into the floor. This motion will rotate your pelvis so that your tail bone is scooping upwards rather than pointing at your feet or into the floor.   With a gentle tension and even breathing, hold this position for __________ seconds.  Repeat __________ times. Complete this exercise __________ times per day.  STRENGTHENING - Abdominals, Crunches   Lie on a firm bed or floor. Keeping your legs in front of you, bend your knees so they are both pointed toward the ceiling and your feet are flat on the floor. Cross your arms over your chest.   Slightly tip your chin down without bending your neck.   Tense your abdominals and slowly lift your trunk high enough to just clear your shoulder blades. Lifting higher can put excessive stress on the lower back and does not further strengthen your abdominal muscles.   Control your return to the starting position.  Repeat __________ times. Complete this exercise __________ times per day.  STRENGTHENING - Quadruped, Opposite UE/LE Lift   Assume a hands and knees position on a firm surface. Keep your hands under your shoulders and your knees under your hips. You may place padding under your knees for comfort.   Find your neutral spine and gently tense your abdominal muscles so that you can maintain this position. Your shoulders and hips should form a rectangle that is parallel with the floor and is not twisted.   Keeping your trunk steady, lift your right hand no higher than your  shoulder and then your left leg no higher than your hip. Make sure you are not holding your breath. Hold this position __________ seconds.   Continuing to keep your  abdominal muscles tense and your back steady, slowly return to your starting position. Repeat with the opposite arm and leg.  Repeat __________ times. Complete this exercise __________ times per day.  STRENGTHENING - Lower Abdominals, Double Knee Lift  Lie on a firm bed or floor. Keeping your legs in front of you, bend your knees so they are both pointed toward the ceiling and your feet are flat on the floor.   Tense your abdominal muscles to brace your lower back and slowly lift both of your knees until they come over your hips. Be certain not to hold your breath.   Hold __________ seconds. Using your abdominal muscles, return to the starting position in a slow and controlled manner.  Repeat __________ times. Complete this exercise __________ times per day.  POSTURE AND BODY MECHANICS CONSIDERATIONS - Low Back Strain Keeping correct posture when sitting, standing or completing your activities will reduce the stress put on different body tissues, allowing injured tissues a chance to heal and limiting painful experiences. The following are general guidelines for improved posture. Your physician or physical therapist will provide you with any instructions specific to your needs. While reading these guidelines, remember:  The exercises prescribed by your provider will help you have the flexibility and strength to maintain correct postures.   The correct posture provides the best environment for your joints to work. All of your joints have less wear and tear when properly supported by a spine with good posture. This means you will experience a healthier, less painful body.   Correct posture must be practiced with all of your activities, especially prolonged sitting and standing. Correct posture is as important when doing repetitive  low-stress activities (typing) as it is when doing a single heavy-load activity (lifting).  RESTING POSITIONS Consider which positions are most painful for you when choosing a resting position. If you have pain with flexion-based activities (sitting, bending, stooping, squatting), choose a position that allows you to rest in a less flexed posture. You would want to avoid curling into a fetal position on your side. If your pain worsens with extension-based activities (prolonged standing, working overhead), avoid resting in an extended position such as sleeping on your stomach. Most people will find more comfort when they rest with their spine in a more neutral position, neither too rounded nor too arched. Lying on a non-sagging bed on your side with a pillow between your knees, or on your back with a pillow under your knees will often provide some relief. Keep in mind, being in any one position for a prolonged period of time, no matter how correct your posture, can still lead to stiffness. PROPER SITTING POSTURE In order to minimize stress and discomfort on your spine, you must sit with correct posture. Sitting with good posture should be effortless for a healthy body. Returning to good posture is a gradual process. Many people can work toward this most comfortably by using various supports until they have the flexibility and strength to maintain this posture on their own. When sitting with proper posture, your ears will fall over your shoulders and your shoulders will fall over your hips. You should use the back of the chair to support your upper back. Your lower back will be in a neutral position, just slightly arched. You may place a small pillow or folded towel at the base of your lower back for support.  When working at a desk, create an environment that supports good, upright posture. Without extra support,  muscles tire, which leads to excessive strain on joints and other tissues. Keep these  recommendations in mind: CHAIR:  A chair should be able to slide under your desk when your back makes contact with the back of the chair. This allows you to work closely.   The chair's height should allow your eyes to be level with the upper part of your monitor and your hands to be slightly lower than your elbows.  BODY POSITION  Your feet should make contact with the floor. If this is not possible, use a foot rest.   Keep your ears over your shoulders. This will reduce stress on your neck and lower back.  INCORRECT SITTING POSTURES  If you are feeling tired and unable to assume a healthy sitting posture, do not slouch or slump. This puts excessive strain on your back tissues, causing more damage and pain. Healthier options include:  Using more support, like a lumbar pillow.   Switching tasks to something that requires you to be upright or walking.   Talking a brief walk.   Lying down to rest in a neutral-spine position.  PROLONGED STANDING WHILE SLIGHTLY LEANING FORWARD  When completing a task that requires you to lean forward while standing in one place for a long time, place either foot up on a stationary 2-4 inch high object to help maintain the best posture. When both feet are on the ground, the lower back tends to lose its slight inward curve. If this curve flattens (or becomes too large), then the back and your other joints will experience too much stress, tire more quickly, and can cause pain. CORRECT STANDING POSTURES Proper standing posture should be assumed with all daily activities, even if they only take a few moments, like when brushing your teeth. As in sitting, your ears should fall over your shoulders and your shoulders should fall over your hips. You should keep a slight tension in your abdominal muscles to brace your spine. Your tailbone should point down to the ground, not behind your body, resulting in an over-extended swayback posture.  INCORRECT STANDING POSTURES    Common incorrect standing postures include a forward head, locked knees and/or an excessive swayback. WALKING Walk with an upright posture. Your ears, shoulders and hips should all line-up. PROLONGED ACTIVITY IN A FLEXED POSITION When completing a task that requires you to bend forward at your waist or lean over a low surface, try to find a way to stabilize 3 out of 4 of your limbs. You can place a hand or elbow on your thigh or rest a knee on the surface you are reaching across. This will provide you more stability so that your muscles do not fatigue as quickly. By keeping your knees relaxed, or slightly bent, you will also reduce stress across your lower back. CORRECT LIFTING TECHNIQUES DO :   Assume a wide stance. This will provide you more stability and the opportunity to get as close as possible to the object which you are lifting.   Tense your abdominals to brace your spine. Bend at the knees and hips. Keeping your back locked in a neutral-spine position, lift using your leg muscles. Lift with your legs, keeping your back straight.   Test the weight of unknown objects before attempting to lift them.   Try to keep your elbows locked down at your sides in order get the best strength from your shoulders when carrying an object.   Always ask for help when lifting heavy or  awkward objects.  INCORRECT LIFTING TECHNIQUES DO NOT:   Lock your knees when lifting, even if it is a small object.   Bend and twist. Pivot at your feet or move your feet when needing to change directions.   Assume that you can safely pick up even a paper clip without proper posture.  Document Released: 12/03/2005 Document Revised: 11/22/2011 Document Reviewed: 03/17/2009 [facility] Patient Information 2012 [place], [place].

## 2012-08-31 NOTE — Progress Notes (Signed)
Subjective:    Patient ID: Kendra Simmons, female    DOB: [birth year], 61 y.o.   MRN: [id number]  HPIThis 61 y.o. female presents for evaluation of low back pain.  Onset last night at 8:30.  B pain with pain worse on R>L now.  Radiating into L leg once since onset; duration of L leg radiation a few minutes.  N/t/w.  Normal b/b function.  No saddle paresthesias.  History of lower back issues but mild; no major DDD lumbar.  Taken Tylenol two tablets last night and during the night.  Severity 5/10.  Slept moderately well.  Mother is elderly and stays with her last night; unable to help her last night due to pain.  No injury or overuse.  Went shopping yesterday with friend.  No urinary symptoms; no dysuria, urgency, frequency, hematuria.   Suffers with dementia and has suffered with frequent falls in past several months; followed by [facility] for falls and dementia.  History of multiple fractures; PCP has recommended bone density scan.   No history of PUD or renal insufficiency.   PCP:  [facility]   Review of Systems  Constitutional: Negative for fever, chills and diaphoresis.  Musculoskeletal: Positive for myalgias, back pain and arthralgias.  Neurological: Negative for weakness and numbness.        Past Medical History  Diagnosis Date  . High cholesterol   . Depression   . Bipolar 1 disorder     No past surgical history on file.  Prior to Admission medications   Medication Sig Start Date End Date Taking? Authorizing Provider  ALPRAZolam Prudy Feeler) 1 MG tablet Take 1 mg by mouth at bedtime as needed.   Yes Historical Provider, MD  amitriptyline (ELAVIL) 100 MG tablet Take 100 mg by mouth at bedtime.   Yes Historical Provider, MD  ARIPiprazole (ABILIFY) 10 MG tablet Take 10 mg by mouth daily.   Yes Historical Provider, MD  FLUoxetine (PROZAC) 40 MG capsule Take 20 mg by mouth 2 (two) times daily.    Yes Historical Provider, MD  memantine (NAMENDA) 10 MG tablet Take 10 mg by mouth 2 (two)  times daily.     Yes Historical Provider, MD  omeprazole (PRILOSEC) 20 MG capsule Take 20 mg by mouth daily.     Yes Historical Provider, MD  pravastatin (PRAVACHOL) 80 MG tablet Take 20 mg by mouth daily.    Yes Historical Provider, MD  Vitamin D, Ergocalciferol, (DRISDOL) 50000 UNITS CAPS Take 50,000 Units by mouth every 7 (seven) days.     Yes Historical Provider, MD  Cyanocobalamin 500 MCG/0.1ML SOLN Inject 3 mLs into the muscle once a week.      Historical Provider, MD  imipramine (TOFRANIL) 25 MG tablet Take 25 mg by mouth at bedtime. Pt. Takes 4 (25mg ) tablets at HS     Historical Provider, MD  niacin (SLO-NIACIN) 500 MG tablet Take 500 mg by mouth.      Historical Provider, MD    No Known Allergies  History   Social History  . Marital Status: Divorced    Spouse Name: N/A    Number of Children: N/A  . Years of Education: N/A   Occupational History  . Not on file.   Social History Main Topics  . Smoking status: Never Smoker   . Smokeless tobacco: Not on file  . Alcohol Use: No  . Drug Use: No  . Sexually Active: Not on file   Other Topics Concern  . Not on file  Social History Narrative  . No narrative on file    No family history on file.  Objective:   Physical Exam  Nursing note and vitals reviewed. Constitutional: She is oriented to person, place, and time. She appears well-developed and well-nourished. No distress.  HENT:  Head: Normocephalic and atraumatic.  Eyes: Conjunctivae normal are normal. Pupils are equal, round, and reactive to light.  Musculoskeletal: She exhibits tenderness.       Lumbar back: She exhibits decreased range of motion, tenderness and pain. She exhibits no bony tenderness, no swelling and no edema.       LUMBAR SPINE: DECREASED ROM WITH FLEXION, ROTATION SIDE TO SIDE; NORMAL EXTENSION AND LATERAL SIDE BENDING; MARCHING INTACT; TOE AND HEEL WALKING INTACT.  Neurological: She is alert and oriented to person, place, and time. No cranial  nerve deficit. She exhibits normal muscle tone. Coordination normal.  Skin: Skin is warm and dry. She is not diaphoretic.  Psychiatric: She has a normal mood and affect. Her behavior is normal. Judgment and thought content normal.   [facility] reading (PRIMARY) by  Dr. Katrinka Blazing.  LS Spine: NAD.      Assessment & Plan:   1. Low back pain  DG Lumbar Spine 2-3 Views  2. Lumbar strain    3.  Dementia 4.  Multiple Fall Hx 5. Multiple Fracture Hx   1.  Low back pain/Lumbar Strain:  New.  Xray negative for compression fracture.  Rx for Ibuprofen 600 mg every 6 hours PRN.  Recommend heat daily for next week.  Home exercises provided to perform daily. Will not prescribe muscle relaxer or narcotic due to history of frequent falls, dementia, multiple fracture hx.  Patient expressed understanding; if symptoms persist, may warrant PT formal.

## 2012-09-02 NOTE — Progress Notes (Signed)
Reviewed and agree.

## 2012-09-09 ENCOUNTER — Ambulatory Visit (HOSPITAL_COMMUNITY): Payer: Self-pay | Admitting: Physician Assistant

## 2012-09-10 ENCOUNTER — Ambulatory Visit (INDEPENDENT_AMBULATORY_CARE_PROVIDER_SITE_OTHER): Payer: Medicare Other | Admitting: Physician Assistant

## 2012-09-10 DIAGNOSIS — F331 Major depressive disorder, recurrent, moderate: Secondary | ICD-10-CM | POA: Diagnosis not present

## 2012-09-10 MED ORDER — BUPROPION HCL ER (SR) 150 MG PO TB12: 150.0000 mg | ORAL_TABLET | Freq: Every day | ORAL | Status: DC

## 2012-09-10 NOTE — Progress Notes (Signed)
   [facility] Follow-up Outpatient Visit  Kendra Simmons 28-Aug-1951  Date: 09/10/2012   Subjective: Kendra Simmons presents today to followup on her treatment for depression. She states "I'm not happy with my weight problems." She reports that she is no longer drinking any Cokes, only water. She walks some but has not increased her exercise level. She reports prior to taking Abilify she weighed 170 pounds, and she now weighs approximately 207. Her Abilify dose was increased from 5 mg to 10 mg because she was having breakthrough depression. She reports that she sleeps okay if she takes both of her Xanax at bedtime. She denies any suicidal or homicidal ideation. She denies any auditory or visual hallucinations. She denies that she has any anxiety.  There were no vitals filed for this visit.  Mental Status Examination  Appearance: Casual Alert: Yes Attention: good  Cooperative: Yes Eye Contact: Good Speech: Clear and coherent Psychomotor Activity: Normal Memory/Concentration: Intact Oriented: person, place, time/date and situation Mood: Anxious and Dysphoric Affect: Congruent Thought Processes and Associations: Linear Fund of Knowledge: Fair Thought Content: Normal Insight: Fair Judgement: Good  Diagnosis: Maj. depressive disorder, recurrent, moderate  Treatment Plan: We will start her on Wellbutrin SR 150 mg to be taken once in the morning, and after about one week she is to reduce her Abilify to 5 mg daily.  Cattleya Dobratz, PA-C

## 2012-10-11 DIAGNOSIS — Z23 Encounter for immunization: Secondary | ICD-10-CM | POA: Diagnosis not present

## 2012-10-13 DIAGNOSIS — M25569 Pain in unspecified knee: Secondary | ICD-10-CM | POA: Diagnosis not present

## 2012-10-20 ENCOUNTER — Emergency Department (HOSPITAL_COMMUNITY)
Admission: EM | Admit: 2012-10-20 | Discharge: 2012-10-20 | Disposition: A | Discharge status: Home / Self Care | Payer: Medicare Other | Attending: Emergency Medicine | Admitting: Emergency Medicine

## 2012-10-20 ENCOUNTER — Encounter (HOSPITAL_COMMUNITY): Payer: Self-pay

## 2012-10-20 ENCOUNTER — Emergency Department (HOSPITAL_COMMUNITY): Payer: Medicare Other

## 2012-10-20 DIAGNOSIS — F329 Major depressive disorder, single episode, unspecified: Secondary | ICD-10-CM | POA: Diagnosis not present

## 2012-10-20 DIAGNOSIS — J209 Acute bronchitis, unspecified: Secondary | ICD-10-CM | POA: Insufficient documentation

## 2012-10-20 DIAGNOSIS — E7800 Pure hypercholesterolemia, unspecified: Secondary | ICD-10-CM | POA: Insufficient documentation

## 2012-10-20 DIAGNOSIS — R51 Headache: Secondary | ICD-10-CM | POA: Insufficient documentation

## 2012-10-20 DIAGNOSIS — Z79899 Other long term (current) drug therapy: Secondary | ICD-10-CM | POA: Diagnosis not present

## 2012-10-20 DIAGNOSIS — R059 Cough, unspecified: Secondary | ICD-10-CM | POA: Diagnosis not present

## 2012-10-20 DIAGNOSIS — J069 Acute upper respiratory infection, unspecified: Secondary | ICD-10-CM | POA: Insufficient documentation

## 2012-10-20 DIAGNOSIS — F3289 Other specified depressive episodes: Secondary | ICD-10-CM | POA: Diagnosis not present

## 2012-10-20 DIAGNOSIS — F319 Bipolar disorder, unspecified: Secondary | ICD-10-CM | POA: Insufficient documentation

## 2012-10-20 DIAGNOSIS — R05 Cough: Secondary | ICD-10-CM | POA: Diagnosis not present

## 2012-10-20 MED ORDER — ALBUTEROL SULFATE HFA 108 (90 BASE) MCG/ACT IN AERS: 2.0000 | INHALATION_SPRAY | RESPIRATORY_TRACT | Status: DC | PRN

## 2012-10-20 MED ORDER — PREDNISONE 20 MG PO TABS: ORAL_TABLET | ORAL | Status: DC

## 2012-10-20 MED ORDER — ALBUTEROL SULFATE (5 MG/ML) 0.5% IN NEBU
2.5000 mg | INHALATION_SOLUTION | Freq: Once | RESPIRATORY_TRACT | Status: AC
  Administered 2012-10-20: 2.5 mg via RESPIRATORY_TRACT
  Filled 2012-10-20: qty 0.5

## 2012-10-20 NOTE — ED Provider Notes (Signed)
History     CSN: [id number]  Arrival date & time 10/20/12  0300   First MD Initiated Contact with Patient 10/20/12 [phone]      Chief Complaint  Patient presents with  . URI  . Cough  . Headache    (Consider location/radiation/quality/duration/timing/severity/associated sxs/prior treatment) HPI This 61 year old female has about a week and a half of a cough with some wheezing and sputum production with some mild headache at times but no severe headache or confusion, she has no treatment prior to arrival other than a nebulizer here in the ED which improved her breathing, she is not short of breath but has been wheezing, she is no chest pain abdominal pain vomiting diarrhea confusion or fever. Past Medical History  Diagnosis Date  . High cholesterol   . Depression   . Bipolar 1 disorder     History reviewed. No pertinent past surgical history.  No family history on file.  History  Substance Use Topics  . Smoking status: Never Smoker   . Smokeless tobacco: Not on file  . Alcohol Use: No    OB History    Grav Para Term Preterm Abortions TAB SAB Ect Mult Living                  Review of Systems 10 Systems reviewed and are negative for acute change except as noted in the HPI. Allergies  Review of patient's allergies indicates no known allergies.  Home Medications   Current Outpatient Rx  Name  Route  Sig  Dispense  Refill  . ALPRAZOLAM 1 MG PO TABS   Oral   Take 1 mg by mouth 3 (three) times daily as needed. anxiety         . AMITRIPTYLINE HCL 100 MG PO TABS   Oral   Take 100 mg by mouth at bedtime.         . ARIPIPRAZOLE 10 MG PO TABS   Oral   Take 10 mg by mouth daily.         . BUPROPION HCL ER (SR) 150 MG PO TB12   Oral   Take 1 tablet (150 mg total) by mouth daily.   30 tablet   1   . CYANOCOBALAMIN 500 MCG/0.1ML NA SOLN   Intramuscular   Inject 3 mLs into the muscle once a week.           [name] FLUOXETINE HCL 40 MG PO CAPS   Oral   Take 20  mg by mouth 2 (two) times daily.          . IMIPRAMINE HCL 25 MG PO TABS   Oral   Take 25 mg by mouth at bedtime. Pt. Takes 4 (25mg ) tablets at HS          . MEMANTINE HCL 10 MG PO TABS   Oral   Take 10 mg by mouth 2 (two) times daily.           [name] OMEPRAZOLE 20 MG PO CPDR   Oral   Take 20 mg by mouth daily.          [name] PRAVASTATIN SODIUM 80 MG PO TABS   Oral   Take 40 mg by mouth daily.          [name] VITAMIN D (ERGOCALCIFEROL) 50000 UNITS PO CAPS   Oral   Take 50,000 Units by mouth every 7 (seven) days.           . ALBUTEROL SULFATE HFA  108 (90 BASE) MCG/ACT IN AERS   Inhalation   Inhale 2 puffs into the lungs every 2 (two) hours as needed for wheezing or shortness of breath (cough).   1 Inhaler   0   . PREDNISONE 20 MG PO TABS      3 tabs po day one, then 2 po daily x 4 days   11 tablet   0     BP 125/81  Pulse 85  Temp 97.7 F (36.5 C) (Oral)  Resp 16  Ht 5\' 4"  (1.626 m)  Wt 200 lb (90.719 kg)  BMI 34.33 kg/m2  SpO2 97%  Physical Exam  Nursing note and vitals reviewed. Constitutional:       Awake, alert, nontoxic appearance.  HENT:  Head: Atraumatic.  Eyes: Right eye exhibits no discharge. Left eye exhibits no discharge.  Neck: Neck supple.  Cardiovascular: Normal rate and regular rhythm.   No murmur heard. Pulmonary/Chest: Effort normal. No respiratory distress. She has wheezes. She has no rales. She exhibits no tenderness.       Diffuse scattered rhonchi and wheezes with no retractions no accessory muscle usage and the patient is able to speak with full sentences with normal pulse oximetry in room air 97%.  Abdominal: Soft. There is no tenderness. There is no rebound.  Musculoskeletal: She exhibits no edema and no tenderness.       Baseline ROM, no obvious new focal weakness.  Neurological:       Mental status and motor strength appears baseline for patient and situation.  Skin: No rash noted.  Psychiatric: She has a normal mood and affect.      ED Course  Procedures (including critical care time)  Labs Reviewed - No data to display Dg Chest 2 View  10/20/2012  *RADIOLOGY REPORT*  Clinical Data: Cough and congestion for 7-10 days.  Headache.  CHEST - 2 VIEW  Comparison: Chest radiograph performed 11/25/2007  Findings: The lungs are well-aerated and clear.  There is no evidence of focal opacification, pleural effusion or pneumothorax.  The heart is normal in size; the mediastinal contour is within normal limits.  No acute osseous abnormalities are seen.  IMPRESSION: No acute cardiopulmonary process seen.   Original Report Authenticated By: Tonia Ghent, M.D.      1. Bronchitis, acute, with bronchospasm       MDM  Pt stable in ED with no significant deterioration in condition.  Patient / Family / Caregiver informed of clinical course, understand medical decision-making process, and agree with plan.  I doubt any other EMC precluding discharge at this time including, but not necessarily limited to the following:SBI.        Hurman Horn, MD 10/20/12 [phone]

## 2012-10-20 NOTE — ED Notes (Signed)
Pt presents with NAD- c/o of URI with greenish yellow productive cough.

## 2012-10-21 ENCOUNTER — Ambulatory Visit: Payer: Self-pay | Admitting: *Deleted

## 2012-10-23 ENCOUNTER — Ambulatory Visit (INDEPENDENT_AMBULATORY_CARE_PROVIDER_SITE_OTHER): Payer: Medicare Other | Admitting: Physician Assistant

## 2012-10-23 DIAGNOSIS — F411 Generalized anxiety disorder: Secondary | ICD-10-CM | POA: Diagnosis not present

## 2012-10-23 DIAGNOSIS — F331 Major depressive disorder, recurrent, moderate: Secondary | ICD-10-CM

## 2012-10-23 MED ORDER — BUPROPION HCL ER (XL) 150 MG PO TB24: 150.0000 mg | ORAL_TABLET | Freq: Every day | ORAL | Status: DC

## 2012-10-23 NOTE — Progress Notes (Signed)
   [facility] Follow-up Outpatient Visit  VALARI TAYLOR 07/06/1951  Date: 10/23/2012   Subjective: [place] presents today to followup on her treatment for depression and anxiety. She has decreased her Abilify to 5 mg do to the weight gain associated with a higher dose. She feels that she cannot tell a difference on the Wellbutrin SR 150 mg, but she denies any side effects either. She continues to report that she has breakthrough depression, with trouble sleeping. She also states that she is having breakthrough anxiety. She has been sick with bronchitis recently, and has been put on steroid medication and an albuterol inhaler after having to go to the hospital for breathing treatments. She denies any suicidal or homicidal ideation. She denies any auditory or visual hallucinations. She declines a referral for therapy.  There were no vitals filed for this visit.  Mental Status Examination  Appearance: Fairly groomed and casually dressed Alert: Yes Attention: good  Cooperative: Yes Eye Contact: Good Speech: Clear and coherent Psychomotor Activity: Normal Memory/Concentration: Intact Oriented: person, place, time/date and situation Mood: Dysphoric Affect: Blunt Thought Processes and Associations: Linear Fund of Knowledge: Good Thought Content: Normal Insight: Fair Judgement: Good  Diagnosis: Maj. depressive disorder, recurrent, moderate; generalized anxiety disorder  Treatment Plan: We will change her Wellbutrin to XL 150 mg daily, and continue the Abilify at 5 mg daily. She is also prescribed Prozac 20 mg twice daily, Xanax 1 mg 3 times a day, Elavil 100 mg at bedtime and Namenda 10 mg daily.  Roswell Ndiaye, PA-C

## 2012-10-27 ENCOUNTER — Ambulatory Visit (INDEPENDENT_AMBULATORY_CARE_PROVIDER_SITE_OTHER): Payer: Medicare Other | Admitting: Family Medicine

## 2012-10-27 VITALS — BP 128/83 | HR 97 | Temp 98.0°F | Resp 16 | Ht 64.0 in | Wt 207.2 lb

## 2012-10-27 DIAGNOSIS — R0989 Other specified symptoms and signs involving the circulatory and respiratory systems: Secondary | ICD-10-CM | POA: Diagnosis not present

## 2012-10-27 DIAGNOSIS — H9209 Otalgia, unspecified ear: Secondary | ICD-10-CM

## 2012-10-27 DIAGNOSIS — J029 Acute pharyngitis, unspecified: Secondary | ICD-10-CM

## 2012-10-27 LAB — POCT RAPID STREP A (OFFICE): Rapid Strep A Screen: NEGATIVE

## 2012-10-27 MED ORDER — CEFDINIR 300 MG PO CAPS: 300.0000 mg | ORAL_CAPSULE | Freq: Two times a day (BID) | ORAL | Status: DC

## 2012-10-27 NOTE — Patient Instructions (Addendum)
Please let me know if you are not better soon!  Sooner if you get worse.

## 2012-10-27 NOTE — Progress Notes (Signed)
[facility] [address], [place] [address] [phone]  Date:  10/27/2012   Name:  Kendra Simmons   DOB:  1951-08-03   MRN:  [id number]  PCP:  No primary provider on file.    Chief Complaint: Sore Throat and Cough   History of Present Illness:  Kendra Simmons is a 62 y.o. very pleasant female patient who presents with the following:  Here today with illness.  She was at the ED on 10/20/12 with complaint of URI, cough and HA.  She was given prednisone and albuterol. She was diagnosed with bronchospasm. (She also does have Bipolar I disorder and is treated with several medications.) However, she continues to have cough and wheezing, and her right ear/ right sided glands and the right side of her throat are quite sore.   She is coughing up some mucus.  This will wax and wane. Talking for a some time causes more coughing and loss of voice No fever.  She has had some occasional diarrhea but no vomiting.   She has been eating ok.    Patient Active Problem List  Diagnosis  . HEPATITIS B  . HYPERCHOLESTEROLEMIA  . DYSLIPIDEMIA  . SLEEP APNEA, OBSTRUCTIVE  . HYPOTENSION  . GERD  . MYALGIA  . SYNCOPE  . GAIT DISTURBANCE  . CHEST DISCOMFORT  . DEPRESSION, HX OF  . Mood disorder in conditions classified elsewhere  . Major depressive disorder, recurrent episode, moderate  . Generalized anxiety disorder    Past Medical History  Diagnosis Date  . High cholesterol   . Depression   . Bipolar 1 disorder   . Arthritis   . Anxiety     Past Surgical History  Procedure Date  . Cosmetic surgery     History  Substance Use Topics  . Smoking status: Never Smoker   . Smokeless tobacco: Not on file  . Alcohol Use: No    No family history on file.  No Known Allergies  Medication list has been reviewed and updated.  Current Outpatient Prescriptions on File Prior to Visit  Medication Sig Dispense Refill  . albuterol (PROVENTIL HFA;VENTOLIN  HFA) 108 (90 BASE) MCG/ACT inhaler Inhale 2 puffs into the lungs every 2 (two) hours as needed for wheezing or shortness of breath (cough).  1 Inhaler  0  . ALPRAZolam (XANAX) 1 MG tablet Take 1 mg by mouth 3 (three) times daily as needed. anxiety      . amitriptyline (ELAVIL) 100 MG tablet Take 100 mg by mouth at bedtime.      . ARIPiprazole (ABILIFY) 10 MG tablet Take 10 mg by mouth daily.      [name] buPROPion (WELLBUTRIN XL) 150 MG 24 hr tablet Take 1 tablet (150 mg total) by mouth daily.  30 tablet  1  . Cyanocobalamin 500 MCG/0.1ML SOLN Inject 3 mLs into the muscle once a week.        [name] FLUoxetine (PROZAC) 40 MG capsule Take 20 mg by mouth 2 (two) times daily.       [name] imipramine (TOFRANIL) 25 MG tablet Take 25 mg by mouth at bedtime. Pt. Takes 4 (25mg ) tablets at HS       . memantine (NAMENDA) 10 MG tablet Take 10 mg by mouth 2 (two) times daily.        [name] omeprazole (PRILOSEC) 20 MG capsule Take 20 mg by mouth daily.       . pravastatin (PRAVACHOL) 80 MG tablet  Take 40 mg by mouth daily.       . Vitamin D, Ergocalciferol, (DRISDOL) 50000 UNITS CAPS Take 50,000 Units by mouth every 7 (seven) days.        . predniSONE (DELTASONE) 20 MG tablet 3 tabs po day one, then 2 po daily x 4 days  11 tablet  0    Review of Systems:  As per HPI- otherwise negative.   Physical Examination: Filed Vitals:   10/27/12 0941  BP: 128/83  Pulse: 97  Temp: 98 F (36.7 C)  Resp: 16   Filed Vitals:   10/27/12 0941  Height: 5\' 4"  (1.626 m)  Weight: 207 lb 3.2 oz (93.985 kg)   Body mass index is 35.57 kg/(m^2). Ideal Body Weight: Weight in (lb) to have BMI = 25: 145.3   GEN: WDWN, NAD, Non-toxic, A & O x 3, overweight HEENT: Atraumatic, Normocephalic. Neck supple. No masses, No LAD but she does have some tenderness over her submandibular nodes.  Bilateral TM wnl, oropharynx normal.  PEERL,EOMI.   Ears and Nose: No external deformity. CV: RRR, No M/G/R. No JVD. No thrill. No extra heart sounds. PULM:  CTA B, no wheezes, crackles, rhonchi. No retractions. No resp. distress. No accessory muscle use. ABD: S, NT, ND, +BS. No rebound. No HSM. EXTR: No c/c/e NEURO Normal gait.  PSYCH: Normally interactive. Conversant. Not depressed or anxious appearing.  Calm demeanor.   Results for orders placed in visit on 10/27/12  POCT RAPID STREP A (OFFICE)      Component Value Range   Rapid Strep A Screen Negative  Negative    Assessment and Plan: 1. Sore throat  POCT rapid strep A, cefdinir (OMNICEF) 300 MG capsule  2. Chest congestion  cefdinir (OMNICEF) 300 MG capsule  3. Ear pain     Persistent URI symptoms that are getting better.  Advised her that she does not appear to have strep throat or AOM.  She may start the omnicef now or wait a coupe of days to see if she gets better.    Abbe Amsterdam, MD

## 2012-11-25 ENCOUNTER — Other Ambulatory Visit (HOSPITAL_COMMUNITY): Payer: Self-pay | Admitting: Physician Assistant

## 2012-11-25 DIAGNOSIS — F0390 Unspecified dementia without behavioral disturbance: Secondary | ICD-10-CM | POA: Diagnosis not present

## 2012-11-25 DIAGNOSIS — E782 Mixed hyperlipidemia: Secondary | ICD-10-CM | POA: Diagnosis not present

## 2012-12-14 ENCOUNTER — Emergency Department (HOSPITAL_COMMUNITY): Payer: Medicare Other

## 2012-12-14 ENCOUNTER — Encounter (HOSPITAL_COMMUNITY): Payer: Self-pay | Admitting: Emergency Medicine

## 2012-12-14 ENCOUNTER — Emergency Department (HOSPITAL_COMMUNITY)
Admission: EM | Admit: 2012-12-14 | Discharge: 2012-12-14 | Disposition: A | Discharge status: Home / Self Care | Payer: Medicare Other | Source: Home / Self Care | Attending: Emergency Medicine | Admitting: Emergency Medicine

## 2012-12-14 DIAGNOSIS — S0993XA Unspecified injury of face, initial encounter: Secondary | ICD-10-CM | POA: Insufficient documentation

## 2012-12-14 DIAGNOSIS — J189 Pneumonia, unspecified organism: Secondary | ICD-10-CM | POA: Diagnosis not present

## 2012-12-14 DIAGNOSIS — S0990XA Unspecified injury of head, initial encounter: Secondary | ICD-10-CM | POA: Diagnosis not present

## 2012-12-14 DIAGNOSIS — R7309 Other abnormal glucose: Secondary | ICD-10-CM | POA: Diagnosis not present

## 2012-12-14 DIAGNOSIS — R51 Headache: Secondary | ICD-10-CM | POA: Insufficient documentation

## 2012-12-14 DIAGNOSIS — D696 Thrombocytopenia, unspecified: Secondary | ICD-10-CM | POA: Diagnosis present

## 2012-12-14 DIAGNOSIS — R630 Anorexia: Secondary | ICD-10-CM | POA: Insufficient documentation

## 2012-12-14 DIAGNOSIS — F331 Major depressive disorder, recurrent, moderate: Secondary | ICD-10-CM | POA: Diagnosis not present

## 2012-12-14 DIAGNOSIS — S199XXA Unspecified injury of neck, initial encounter: Secondary | ICD-10-CM | POA: Insufficient documentation

## 2012-12-14 DIAGNOSIS — M129 Arthropathy, unspecified: Secondary | ICD-10-CM | POA: Insufficient documentation

## 2012-12-14 DIAGNOSIS — Z6837 Body mass index (BMI) 37.0-37.9, adult: Secondary | ICD-10-CM | POA: Diagnosis not present

## 2012-12-14 DIAGNOSIS — IMO0002 Reserved for concepts with insufficient information to code with codable children: Secondary | ICD-10-CM | POA: Insufficient documentation

## 2012-12-14 DIAGNOSIS — E119 Type 2 diabetes mellitus without complications: Secondary | ICD-10-CM | POA: Diagnosis present

## 2012-12-14 DIAGNOSIS — F3130 Bipolar disorder, current episode depressed, mild or moderate severity, unspecified: Secondary | ICD-10-CM | POA: Diagnosis not present

## 2012-12-14 DIAGNOSIS — Z9119 Patient's noncompliance with other medical treatment and regimen: Secondary | ICD-10-CM | POA: Diagnosis not present

## 2012-12-14 DIAGNOSIS — J3489 Other specified disorders of nose and nasal sinuses: Secondary | ICD-10-CM | POA: Insufficient documentation

## 2012-12-14 DIAGNOSIS — G9340 Encephalopathy, unspecified: Secondary | ICD-10-CM | POA: Diagnosis not present

## 2012-12-14 DIAGNOSIS — Y9389 Activity, other specified: Secondary | ICD-10-CM | POA: Insufficient documentation

## 2012-12-14 DIAGNOSIS — Z8659 Personal history of other mental and behavioral disorders: Secondary | ICD-10-CM | POA: Diagnosis not present

## 2012-12-14 DIAGNOSIS — F3289 Other specified depressive episodes: Secondary | ICD-10-CM | POA: Insufficient documentation

## 2012-12-14 DIAGNOSIS — E860 Dehydration: Secondary | ICD-10-CM | POA: Diagnosis not present

## 2012-12-14 DIAGNOSIS — E785 Hyperlipidemia, unspecified: Secondary | ICD-10-CM | POA: Diagnosis present

## 2012-12-14 DIAGNOSIS — M542 Cervicalgia: Secondary | ICD-10-CM | POA: Diagnosis not present

## 2012-12-14 DIAGNOSIS — Z91199 Patient's noncompliance with other medical treatment and regimen due to unspecified reason: Secondary | ICD-10-CM | POA: Diagnosis not present

## 2012-12-14 DIAGNOSIS — E7800 Pure hypercholesterolemia, unspecified: Secondary | ICD-10-CM | POA: Insufficient documentation

## 2012-12-14 DIAGNOSIS — E669 Obesity, unspecified: Secondary | ICD-10-CM | POA: Diagnosis present

## 2012-12-14 DIAGNOSIS — J13 Pneumonia due to Streptococcus pneumoniae: Secondary | ICD-10-CM | POA: Diagnosis not present

## 2012-12-14 DIAGNOSIS — K219 Gastro-esophageal reflux disease without esophagitis: Secondary | ICD-10-CM | POA: Diagnosis not present

## 2012-12-14 DIAGNOSIS — R0902 Hypoxemia: Secondary | ICD-10-CM | POA: Diagnosis not present

## 2012-12-14 DIAGNOSIS — F411 Generalized anxiety disorder: Secondary | ICD-10-CM | POA: Insufficient documentation

## 2012-12-14 DIAGNOSIS — Z79899 Other long term (current) drug therapy: Secondary | ICD-10-CM | POA: Insufficient documentation

## 2012-12-14 DIAGNOSIS — F060 Psychotic disorder with hallucinations due to known physiological condition: Secondary | ICD-10-CM | POA: Diagnosis not present

## 2012-12-14 DIAGNOSIS — F329 Major depressive disorder, single episode, unspecified: Secondary | ICD-10-CM | POA: Diagnosis not present

## 2012-12-14 DIAGNOSIS — F319 Bipolar disorder, unspecified: Secondary | ICD-10-CM | POA: Insufficient documentation

## 2012-12-14 DIAGNOSIS — S298XXA Other specified injuries of thorax, initial encounter: Secondary | ICD-10-CM | POA: Diagnosis not present

## 2012-12-14 DIAGNOSIS — G9341 Metabolic encephalopathy: Secondary | ICD-10-CM | POA: Diagnosis not present

## 2012-12-14 LAB — CBC WITH DIFFERENTIAL/PLATELET
Basophils Relative: 0 % (ref 0–1)
Eosinophils Relative: 0 % (ref 0–5)
HCT: 39.4 % (ref 36.0–46.0)
Lymphs Abs: 0.6 10*3/uL — ABNORMAL LOW (ref 0.7–4.0)
MCH: 31.2 pg (ref 26.0–34.0)
MCV: 94.5 fL (ref 78.0–100.0)
Monocytes Absolute: 1 10*3/uL (ref 0.1–1.0)
Monocytes Relative: 8 % (ref 3–12)
Neutro Abs: 11.3 10*3/uL — ABNORMAL HIGH (ref 1.7–7.7)
RBC: 4.17 MIL/uL (ref 3.87–5.11)
WBC Morphology: INCREASED
WBC: 12.9 10*3/uL — ABNORMAL HIGH (ref 4.0–10.5)

## 2012-12-14 LAB — POCT I-STAT, CHEM 8
BUN: 10 mg/dL (ref 6–23)
Chloride: 105 mEq/L (ref 96–112)
Creatinine, Ser: 0.8 mg/dL (ref 0.50–1.10)
Sodium: 140 mEq/L (ref 135–145)
TCO2: 26 mmol/L (ref 0–100)

## 2012-12-14 MED ORDER — MORPHINE SULFATE 4 MG/ML IJ SOLN
4.0000 mg | Freq: Once | INTRAMUSCULAR | Status: AC
  Administered 2012-12-14: 4 mg via INTRAVENOUS
  Filled 2012-12-14: qty 1

## 2012-12-14 MED ORDER — OXYCODONE HCL 5 MG PO TABS
5.0000 mg | ORAL_TABLET | Freq: Four times a day (QID) | ORAL | Status: DC | PRN
  Administered 2012-12-14: 5 mg via ORAL
  Filled 2012-12-14: qty 1

## 2012-12-14 MED ORDER — LEVOFLOXACIN 500 MG PO TABS: 500.0000 mg | ORAL_TABLET | Freq: Every day | ORAL | Status: DC

## 2012-12-14 MED ORDER — ACETAMINOPHEN 325 MG PO TABS
650.0000 mg | ORAL_TABLET | Freq: Once | ORAL | Status: AC
  Administered 2012-12-14: 650 mg via ORAL
  Filled 2012-12-14: qty 2

## 2012-12-14 MED ORDER — SODIUM CHLORIDE 0.9 % IV BOLUS (SEPSIS)
1000.0000 mL | Freq: Once | INTRAVENOUS | Status: AC
  Administered 2012-12-14: 1000 mL via INTRAVENOUS

## 2012-12-14 MED ORDER — OXYCODONE-ACETAMINOPHEN 5-325 MG PO TABS
1.0000 | ORAL_TABLET | Freq: Four times a day (QID) | ORAL | Status: DC | PRN
Start: 2012-12-14 — End: 2012-12-17

## 2012-12-14 MED ORDER — ONDANSETRON HCL 4 MG/2ML IJ SOLN
4.0000 mg | Freq: Once | INTRAMUSCULAR | Status: AC
  Administered 2012-12-14: 4 mg via INTRAVENOUS
  Filled 2012-12-14: qty 2

## 2012-12-14 MED ORDER — IOHEXOL 300 MG/ML  SOLN
100.0000 mL | Freq: Once | INTRAMUSCULAR | Status: AC | PRN
  Administered 2012-12-14: 100 mL via INTRAVENOUS

## 2012-12-14 MED ORDER — LEVOFLOXACIN IN D5W 750 MG/150ML IV SOLN
750.0000 mg | Freq: Once | INTRAVENOUS | Status: AC
  Administered 2012-12-14: 750 mg via INTRAVENOUS
  Filled 2012-12-14: qty 150

## 2012-12-14 NOTE — ED Notes (Signed)
Pt ambulated to restroom with O2 sats of 94% on room air. Pt's sats during ambulation fluctuated from 94% to 96% on room air.

## 2012-12-14 NOTE — ED Notes (Signed)
Pt desat to 85% on RA. Placed pt on 2L . Pt sats back up to 94%
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
## 2012-12-14 NOTE — ED Notes (Addendum)
Pt ambulated in hallway with pulse oximeter. O2 saturation began at 96% on room air during ambulation O2 sats dropped to 91% on room air. With rest after ambulation O2 saturation came up to 97% on room air.

## 2012-12-14 NOTE — ED Notes (Signed)
Pt ambulated on RA to BR. O2 sats 91%. At rest on RA, pt 98%

## 2012-12-15 ENCOUNTER — Encounter (HOSPITAL_COMMUNITY): Payer: Self-pay | Admitting: *Deleted

## 2012-12-15 ENCOUNTER — Inpatient Hospital Stay (HOSPITAL_COMMUNITY)
Admission: EM | Admit: 2012-12-15 | Discharge: 2012-12-19 | DRG: 193 | Disposition: A | Discharge status: Home / Self Care | Payer: Medicare Other | Attending: Internal Medicine | Admitting: Internal Medicine

## 2012-12-15 DIAGNOSIS — F331 Major depressive disorder, recurrent, moderate: Secondary | ICD-10-CM

## 2012-12-15 DIAGNOSIS — F060 Psychotic disorder with hallucinations due to known physiological condition: Secondary | ICD-10-CM | POA: Diagnosis not present

## 2012-12-15 DIAGNOSIS — J189 Pneumonia, unspecified organism: Principal | ICD-10-CM | POA: Diagnosis present

## 2012-12-15 DIAGNOSIS — J13 Pneumonia due to Streptococcus pneumoniae: Secondary | ICD-10-CM | POA: Diagnosis not present

## 2012-12-15 DIAGNOSIS — Z8659 Personal history of other mental and behavioral disorders: Secondary | ICD-10-CM

## 2012-12-15 DIAGNOSIS — E785 Hyperlipidemia, unspecified: Secondary | ICD-10-CM | POA: Diagnosis present

## 2012-12-15 DIAGNOSIS — G92 Toxic encephalopathy: Secondary | ICD-10-CM | POA: Diagnosis present

## 2012-12-15 DIAGNOSIS — E860 Dehydration: Secondary | ICD-10-CM | POA: Diagnosis not present

## 2012-12-15 DIAGNOSIS — R0902 Hypoxemia: Secondary | ICD-10-CM

## 2012-12-15 DIAGNOSIS — Z91199 Patient's noncompliance with other medical treatment and regimen due to unspecified reason: Secondary | ICD-10-CM

## 2012-12-15 DIAGNOSIS — D696 Thrombocytopenia, unspecified: Secondary | ICD-10-CM | POA: Diagnosis present

## 2012-12-15 DIAGNOSIS — K219 Gastro-esophageal reflux disease without esophagitis: Secondary | ICD-10-CM

## 2012-12-15 DIAGNOSIS — F32A Depression, unspecified: Secondary | ICD-10-CM

## 2012-12-15 DIAGNOSIS — R739 Hyperglycemia, unspecified: Secondary | ICD-10-CM | POA: Diagnosis present

## 2012-12-15 DIAGNOSIS — Z9119 Patient's noncompliance with other medical treatment and regimen: Secondary | ICD-10-CM

## 2012-12-15 DIAGNOSIS — F319 Bipolar disorder, unspecified: Secondary | ICD-10-CM | POA: Diagnosis present

## 2012-12-15 DIAGNOSIS — E119 Type 2 diabetes mellitus without complications: Secondary | ICD-10-CM | POA: Diagnosis present

## 2012-12-15 DIAGNOSIS — Z79899 Other long term (current) drug therapy: Secondary | ICD-10-CM

## 2012-12-15 DIAGNOSIS — G9341 Metabolic encephalopathy: Secondary | ICD-10-CM | POA: Diagnosis present

## 2012-12-15 DIAGNOSIS — Z6837 Body mass index (BMI) 37.0-37.9, adult: Secondary | ICD-10-CM

## 2012-12-15 DIAGNOSIS — F411 Generalized anxiety disorder: Secondary | ICD-10-CM | POA: Diagnosis present

## 2012-12-15 DIAGNOSIS — E7800 Pure hypercholesterolemia, unspecified: Secondary | ICD-10-CM | POA: Diagnosis present

## 2012-12-15 DIAGNOSIS — E669 Obesity, unspecified: Secondary | ICD-10-CM | POA: Diagnosis present

## 2012-12-15 LAB — BLOOD GAS, ARTERIAL
Bicarbonate: 21.4 mEq/L (ref 20.0–24.0)
Patient temperature: 98.6
TCO2: 19.6 mmol/L (ref 0–100)
pH, Arterial: 7.381 (ref 7.350–7.450)

## 2012-12-15 NOTE — ED Notes (Signed)
Pt treated and diagnosed yesterday in the ER with pneumonia; given iv fluids and antibiotics; was supposed to have home oxygen setup today but the company did not get the order; tonight family states pt had increased confusion; concerned that patient could not go all night without oxygen; pt sat 94% in triage; oriented without confusion

## 2012-12-16 ENCOUNTER — Observation Stay (HOSPITAL_COMMUNITY): Payer: Medicare Other

## 2012-12-16 ENCOUNTER — Encounter (HOSPITAL_COMMUNITY): Payer: Self-pay | Admitting: Internal Medicine

## 2012-12-16 DIAGNOSIS — R0902 Hypoxemia: Secondary | ICD-10-CM

## 2012-12-16 DIAGNOSIS — R7309 Other abnormal glucose: Secondary | ICD-10-CM

## 2012-12-16 DIAGNOSIS — S0990XA Unspecified injury of head, initial encounter: Secondary | ICD-10-CM | POA: Diagnosis not present

## 2012-12-16 DIAGNOSIS — R739 Hyperglycemia, unspecified: Secondary | ICD-10-CM | POA: Diagnosis present

## 2012-12-16 DIAGNOSIS — J189 Pneumonia, unspecified organism: Secondary | ICD-10-CM | POA: Diagnosis present

## 2012-12-16 DIAGNOSIS — F331 Major depressive disorder, recurrent, moderate: Secondary | ICD-10-CM

## 2012-12-16 DIAGNOSIS — G92 Toxic encephalopathy: Secondary | ICD-10-CM | POA: Diagnosis present

## 2012-12-16 DIAGNOSIS — G9341 Metabolic encephalopathy: Secondary | ICD-10-CM

## 2012-12-16 LAB — COMPREHENSIVE METABOLIC PANEL
ALT: 14 U/L (ref 0–35)
CO2: 24 mEq/L (ref 19–32)
Calcium: 8.7 mg/dL (ref 8.4–10.5)
Creatinine, Ser: 0.76 mg/dL (ref 0.50–1.10)
GFR calc Af Amer: >90 mL/min (ref >90)
GFR calc non Af Amer: 89 mL/min — ABNORMAL LOW (ref >90)
Glucose, Bld: 158 mg/dL — ABNORMAL HIGH (ref 70–99)
Sodium: 137 mEq/L (ref 135–145)
Total Protein: 6.3 g/dL (ref 6.0–8.3)

## 2012-12-16 LAB — BASIC METABOLIC PANEL
BUN: 16 mg/dL (ref 6–23)
Chloride: 102 mEq/L (ref 96–112)
GFR calc Af Amer: >90 mL/min (ref >90)
Glucose, Bld: 232 mg/dL — ABNORMAL HIGH (ref 70–99)
Potassium: 5.1 mEq/L (ref 3.5–5.1)
Sodium: 134 mEq/L — ABNORMAL LOW (ref 135–145)

## 2012-12-16 LAB — CBC WITH DIFFERENTIAL/PLATELET
HCT: 30.7 % — ABNORMAL LOW (ref 36.0–46.0)
Hemoglobin: 10.6 g/dL — ABNORMAL LOW (ref 12.0–15.0)
Hemoglobin: 10.1 g/dL — ABNORMAL LOW (ref 12.0–15.0)
Lymphocytes Relative: 5 % — ABNORMAL LOW (ref 12–46)
Lymphocytes Relative: 7 % — ABNORMAL LOW (ref 12–46)
Lymphs Abs: 0.5 10*3/uL — ABNORMAL LOW (ref 0.7–4.0)
Lymphs Abs: 0.8 10*3/uL (ref 0.7–4.0)
MCHC: 32.9 g/dL (ref 30.0–36.0)
Monocytes Absolute: 0.4 10*3/uL (ref 0.1–1.0)
Monocytes Relative: 3 % (ref 3–12)
Monocytes Relative: 4 % (ref 3–12)
Neutro Abs: 10 10*3/uL — ABNORMAL HIGH (ref 1.7–7.7)
Neutro Abs: 9.4 10*3/uL — ABNORMAL HIGH (ref 1.7–7.7)
Neutrophils Relative %: 92 % — ABNORMAL HIGH (ref 43–77)
Neutrophils Relative %: 89 % — ABNORMAL HIGH (ref 43–77)
RBC: 3.36 MIL/uL — ABNORMAL LOW (ref 3.87–5.11)
RBC: 3.25 MIL/uL — ABNORMAL LOW (ref 3.87–5.11)
WBC: 10.9 10*3/uL — ABNORMAL HIGH (ref 4.0–10.5)

## 2012-12-16 LAB — URINE MICROSCOPIC-ADD ON

## 2012-12-16 LAB — URINALYSIS, ROUTINE W REFLEX MICROSCOPIC
Bilirubin Urine: NEGATIVE
Leukocytes, UA: NEGATIVE
Nitrite: NEGATIVE
Specific Gravity, Urine: 1.028 (ref 1.005–1.030)
pH: 7 (ref 5.0–8.0)

## 2012-12-16 LAB — LEGIONELLA ANTIGEN, URINE: Legionella Antigen, Urine: NEGATIVE

## 2012-12-16 LAB — GLUCOSE, CAPILLARY
Glucose-Capillary: 142 mg/dL — ABNORMAL HIGH (ref 70–99)
Glucose-Capillary: 216 mg/dL — ABNORMAL HIGH (ref 70–99)
Glucose-Capillary: 153 mg/dL — ABNORMAL HIGH (ref 70–99)
Glucose-Capillary: 170 mg/dL — ABNORMAL HIGH (ref 70–99)

## 2012-12-16 LAB — HEMOGLOBIN A1C: Mean Plasma Glucose: 140 mg/dL — ABNORMAL HIGH (ref <117)

## 2012-12-16 LAB — STREP PNEUMONIAE URINARY ANTIGEN: Strep Pneumo Urinary Antigen: NEGATIVE

## 2012-12-16 MED ORDER — SODIUM CHLORIDE 0.9 % IV SOLN
Freq: Once | INTRAVENOUS | Status: AC
  Administered 2012-12-16: 20 mL/h via INTRAVENOUS

## 2012-12-16 MED ORDER — OXYCODONE-ACETAMINOPHEN 5-325 MG PO TABS
1.0000 | ORAL_TABLET | Freq: Four times a day (QID) | ORAL | Status: DC | PRN
  Administered 2012-12-16 – 2012-12-17 (×3): 1 via ORAL
  Administered 2012-12-18: 2 via ORAL
  Administered 2012-12-18 – 2012-12-19 (×2): 1 via ORAL
  Filled 2012-12-16 (×4): qty 1
  Filled 2012-12-16: qty 2
  Filled 2012-12-16 (×2): qty 1

## 2012-12-16 MED ORDER — LIVING WELL WITH DIABETES BOOK
Freq: Once | Status: DC
  Filled 2012-12-16: qty 1

## 2012-12-16 MED ORDER — SODIUM CHLORIDE 0.9 % IV SOLN
INTRAVENOUS | Status: DC
  Administered 2012-12-16 – 2012-12-18 (×5): via INTRAVENOUS

## 2012-12-16 MED ORDER — PANTOPRAZOLE SODIUM 40 MG PO TBEC
40.0000 mg | DELAYED_RELEASE_TABLET | Freq: Every day | ORAL | Status: DC
  Administered 2012-12-16 – 2012-12-19 (×4): 40 mg via ORAL
  Filled 2012-12-16 (×4): qty 1

## 2012-12-16 MED ORDER — SIMVASTATIN 40 MG PO TABS
40.0000 mg | ORAL_TABLET | Freq: Every day | ORAL | Status: DC
  Administered 2012-12-16 – 2012-12-18 (×3): 40 mg via ORAL
  Filled 2012-12-16 (×4): qty 1

## 2012-12-16 MED ORDER — FLUOXETINE HCL 20 MG PO CAPS
40.0000 mg | ORAL_CAPSULE | Freq: Every morning | ORAL | Status: DC
  Administered 2012-12-16 – 2012-12-19 (×4): 40 mg via ORAL
  Filled 2012-12-16 (×4): qty 2

## 2012-12-16 MED ORDER — LEVOFLOXACIN 750 MG PO TABS
750.0000 mg | ORAL_TABLET | Freq: Every day | ORAL | Status: DC
  Administered 2012-12-16 – 2012-12-18 (×3): 750 mg via ORAL
  Filled 2012-12-16 (×4): qty 1

## 2012-12-16 MED ORDER — IMIPRAMINE HCL 50 MG PO TABS
50.0000 mg | ORAL_TABLET | Freq: Every day | ORAL | Status: DC
  Administered 2012-12-16 – 2012-12-17 (×2): 50 mg via ORAL
  Filled 2012-12-16 (×3): qty 1

## 2012-12-16 MED ORDER — MEMANTINE HCL 10 MG PO TABS
10.0000 mg | ORAL_TABLET | Freq: Two times a day (BID) | ORAL | Status: DC
  Administered 2012-12-16 – 2012-12-17 (×3): 10 mg via ORAL
  Filled 2012-12-16 (×4): qty 1

## 2012-12-16 MED ORDER — PNEUMOCOCCAL VAC POLYVALENT 25 MCG/0.5ML IJ INJ
0.5000 mL | INJECTION | INTRAMUSCULAR | Status: AC
  Administered 2012-12-17: 0.5 mL via INTRAMUSCULAR
  Filled 2012-12-16 (×2): qty 0.5

## 2012-12-16 MED ORDER — ENOXAPARIN SODIUM 40 MG/0.4ML ~~LOC~~ SOLN
40.0000 mg | SUBCUTANEOUS | Status: DC
  Filled 2012-12-16: qty 0.4

## 2012-12-16 MED ORDER — ALBUTEROL SULFATE HFA 108 (90 BASE) MCG/ACT IN AERS
2.0000 | INHALATION_SPRAY | RESPIRATORY_TRACT | Status: DC | PRN
  Filled 2012-12-16: qty 6.7

## 2012-12-16 MED ORDER — INSULIN ASPART 100 UNIT/ML ~~LOC~~ SOLN: 0.0000 [IU] | Freq: Every day | SUBCUTANEOUS | Status: DC

## 2012-12-16 MED ORDER — AMITRIPTYLINE HCL 100 MG PO TABS
100.0000 mg | ORAL_TABLET | Freq: Every day | ORAL | Status: DC
  Administered 2012-12-16 – 2012-12-18 (×3): 100 mg via ORAL
  Filled 2012-12-16 (×4): qty 1

## 2012-12-16 MED ORDER — BUPROPION HCL ER (XL) 150 MG PO TB24
150.0000 mg | ORAL_TABLET | Freq: Every day | ORAL | Status: DC
  Administered 2012-12-16 – 2012-12-19 (×4): 150 mg via ORAL
  Filled 2012-12-16 (×4): qty 1

## 2012-12-16 MED ORDER — ALPRAZOLAM 1 MG PO TABS
1.0000 mg | ORAL_TABLET | Freq: Three times a day (TID) | ORAL | Status: DC | PRN
  Administered 2012-12-16 – 2012-12-18 (×5): 1 mg via ORAL
  Filled 2012-12-16 (×5): qty 1

## 2012-12-16 MED ORDER — LEVOFLOXACIN IN D5W 750 MG/150ML IV SOLN
750.0000 mg | Freq: Once | INTRAVENOUS | Status: AC
  Administered 2012-12-16: 750 mg via INTRAVENOUS
  Filled 2012-12-16: qty 150

## 2012-12-16 MED ORDER — ACETAMINOPHEN 325 MG PO TABS
650.0000 mg | ORAL_TABLET | Freq: Four times a day (QID) | ORAL | Status: DC | PRN
  Administered 2012-12-16: 650 mg via ORAL
  Filled 2012-12-16: qty 2

## 2012-12-16 MED ORDER — INSULIN ASPART 100 UNIT/ML ~~LOC~~ SOLN
0.0000 [IU] | Freq: Three times a day (TID) | SUBCUTANEOUS | Status: DC
  Administered 2012-12-16: 1 [IU] via SUBCUTANEOUS
  Administered 2012-12-16: 3 [IU] via SUBCUTANEOUS
  Administered 2012-12-16 – 2012-12-17 (×2): 2 [IU] via SUBCUTANEOUS
  Administered 2012-12-17 – 2012-12-18 (×2): 1 [IU] via SUBCUTANEOUS

## 2012-12-16 NOTE — Progress Notes (Signed)
ANTIBIOTIC CONSULT NOTE - INITIAL  Pharmacy Consult for antibiotic renal monitoring Indication: pneumonia  No Known Allergies  Patient Measurements: Height: 5' 4.5" (163.8 cm) Weight: 220 lb 10.9 oz (100.1 kg) IBW/kg (Calculated) : 55.85  Adjusted Body Weight:   Vital Signs: Temp: 97.7 F (36.5 C) (12/31 0235) Temp src: Oral (12/31 0235) BP: 133/67 mmHg (12/31 0235) Pulse Rate: 96  (12/31 0235) Intake/Output from previous day:   Intake/Output from this shift:    Labs:  Basename 12/16/12 0103 12/14/12 0818 12/14/12 0800  WBC 10.9* -- 12.9*  HGB 10.6* 13.6 13.0  PLT 163 -- 115*  LABCREA -- -- --  CREATININE 0.76 0.80 --   Estimated Creatinine Clearance: 85.8 ml/min (by C-G formula based on Cr of 0.76). No results found for this basename: VANCOTROUGH:2,VANCOPEAK:2,VANCORANDOM:2,GENTTROUGH:2,GENTPEAK:2,GENTRANDOM:2,TOBRATROUGH:2,TOBRAPEAK:2,TOBRARND:2,AMIKACINPEAK:2,AMIKACINTROU:2,AMIKACIN:2, in the last 72 hours   Microbiology: No results found for this or any previous visit (from the past 720 hour(s)).  Medical History: Past Medical History  Diagnosis Date  . High cholesterol   . Depression   . Bipolar 1 disorder   . Arthritis   . Anxiety     Medications:  Anti-infectives     Start     Dose/Rate Route Frequency Ordered Stop   12/16/12 2200   levofloxacin (LEVAQUIN) tablet 750 mg        750 mg Oral Daily at bedtime 12/16/12 0243     12/16/12 0015   levofloxacin (LEVAQUIN) IVPB 750 mg        750 mg 100 mL/hr over 90 Minutes Intravenous  Once 12/16/12 0008 12/16/12 0238         Assessment: Patient with PNA.  Levofloxacin ordered.  Patient has good renal function.  Goal of Therapy:  Levofloxacin dosed based on patient weight and renal function   Plan:  No changes to current dose at this time.  Darlina Guys, Jacquenette Shone Crowford 12/16/2012,3:16 AM
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
## 2012-12-16 NOTE — Progress Notes (Signed)
Patient ID: Kendra Simmons, female   DOB: 1951-09-24, 61 y.o.   MRN: [id number] Patient was admitted for confusion. Work up in ED revealed pneumonia and questionable findings of lung nodule, LUL. Please refer to admission done by Dr. Adela Glimpse today.  Assessment and Plan:  Confusion: unclear etiology; follow up CT head Pneumonia: continue Levaquin  Manson Passey [facility] [phone]
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
## 2012-12-16 NOTE — Care Management Note (Unsigned)
    Page 1 of 1   12/16/2012     1:52:53 PM   CARE MANAGEMENT NOTE 12/16/2012  Patient:  Kendra Simmons, Kendra Simmons   Account Number:  [id number]  Date Initiated:  12/16/2012  Documentation initiated by:  Lanier Clam  Subjective/Objective Assessment:   ADMITTED W/SOB.COMM ACQUIRED PNA.     Action/Plan:   FROM HOME   Anticipated DC Date:  12/17/2012   Anticipated DC Plan:  HOME/SELF CARE      DC Planning Services  CM consult      Choice offered to / List presented to:             Status of service:  In process, will continue to follow Medicare Important Message given?  NA - LOS <3 / Initial given by admissions (If response is "NO", the following Medicare IM given date fields will be blank) Date Medicare IM given:   Date Additional Medicare IM given:    Discharge Disposition:    Per UR Regulation:  Reviewed for med. necessity/level of care/duration of stay  If discussed at Long Length of Stay Meetings, dates discussed:    Comments:  12/16/12 [facility] RN,BSN NCM [phone]

## 2012-12-17 DIAGNOSIS — D696 Thrombocytopenia, unspecified: Secondary | ICD-10-CM | POA: Diagnosis present

## 2012-12-17 DIAGNOSIS — K219 Gastro-esophageal reflux disease without esophagitis: Secondary | ICD-10-CM

## 2012-12-17 DIAGNOSIS — Z8659 Personal history of other mental and behavioral disorders: Secondary | ICD-10-CM

## 2012-12-17 LAB — BASIC METABOLIC PANEL
CO2: 24 mEq/L (ref 19–32)
Chloride: 106 mEq/L (ref 96–112)
Creatinine, Ser: 0.76 mg/dL (ref 0.50–1.10)
GFR calc Af Amer: >90 mL/min (ref >90)
Potassium: 4 mEq/L (ref 3.5–5.1)
Sodium: 137 mEq/L (ref 135–145)

## 2012-12-17 LAB — CBC
MCV: 96.7 fL (ref 78.0–100.0)
Platelets: 139 10*3/uL — ABNORMAL LOW (ref 150–400)
RBC: 3.04 MIL/uL — ABNORMAL LOW (ref 3.87–5.11)
RDW: 14 % (ref 11.5–15.5)
WBC: 8.4 10*3/uL (ref 4.0–10.5)

## 2012-12-17 LAB — GLUCOSE, CAPILLARY
Glucose-Capillary: 178 mg/dL — ABNORMAL HIGH (ref 70–99)
Glucose-Capillary: 136 mg/dL — ABNORMAL HIGH (ref 70–99)
Glucose-Capillary: 140 mg/dL — ABNORMAL HIGH (ref 70–99)

## 2012-12-17 MED ORDER — LEVOFLOXACIN 500 MG PO TABS: 500.0000 mg | ORAL_TABLET | Freq: Every day | ORAL | Status: DC

## 2012-12-17 MED ORDER — OXYCODONE-ACETAMINOPHEN 5-325 MG PO TABS: 1.0000 | ORAL_TABLET | Freq: Four times a day (QID) | ORAL | Status: DC | PRN

## 2012-12-17 MED ORDER — IMIPRAMINE HCL 25 MG PO TABS: 50.0000 mg | ORAL_TABLET | Freq: Every day | ORAL | Status: DC

## 2012-12-17 MED ORDER — ALPRAZOLAM 1 MG PO TABS: 1.0000 mg | ORAL_TABLET | Freq: Three times a day (TID) | ORAL | Status: DC | PRN

## 2012-12-17 MED ORDER — ARIPIPRAZOLE 5 MG PO TABS: 5.0000 mg | ORAL_TABLET | Freq: Every morning | ORAL | Status: DC

## 2012-12-17 MED ORDER — FLUOXETINE HCL 20 MG PO CAPS: 40.0000 mg | ORAL_CAPSULE | Freq: Every morning | ORAL | Status: DC

## 2012-12-17 MED ORDER — AMITRIPTYLINE HCL 100 MG PO TABS: 100.0000 mg | ORAL_TABLET | Freq: Every day | ORAL | Status: DC

## 2012-12-17 MED ORDER — METFORMIN HCL 500 MG PO TABS
500.0000 mg | ORAL_TABLET | Freq: Two times a day (BID) | ORAL | Status: DC
  Administered 2012-12-18 – 2012-12-19 (×3): 500 mg via ORAL
  Filled 2012-12-17 (×5): qty 1

## 2012-12-17 NOTE — Progress Notes (Signed)
Inpatient Diabetes Program Recommendations  AACE/ADA: New Consensus Statement on Inpatient Glycemic Control (2013)  Target Ranges:  Prepandial:   less than 140 mg/dL      Peak postprandial:   less than 180 mg/dL (1-2 hours)      Critically ill patients:  140 - 180 mg/dL   Reason for Visit: [id number] of 6.5% (early diagnosis of DM) No known history of diabetes.  Most probably steroid induced. Pt will need some basic education on carb counting, meal planning. Pt would benefit from addition of Metformin 500 mg bid Will order basic education per RN via video network, exit care notes, etc. Will order RD consult as well.    Note: Thank you, Lenor Coffin, RN, CNS, Diabetes Coordinator [phone])

## 2012-12-17 NOTE — Progress Notes (Signed)
[facility] PROGRESS NOTE  Kendra Simmons:[id number] DOB: March 06, 1951 DOA: 12/15/2012 PCP: No primary provider on file.  Brief narrative: 62 year old female with past medical history of anxiety and depression, dyslipidemia and GERD who presented to ED with altered mental status. CT head was done and it showed no acute intracranial findings. In addition, patient was found to have pneumonia and was started on Levaquin. I have requested psychiatric consult for evaluation of her multiple psych medications. Patient reported not taking medications consistently which may be contributing to her altered mental status.  Assessment/Plan:  Principal Problem:  *Encephalopathy, metabolic  Perhaps due to multiple psych medications and/or non compliance  Now back at her baseline, oriented to time, place and person  Will need PT evaluation - evaluation pending  Active Problems: Diabetes  A1c 6.5  Per diabetic coordinator we started metformin 500 mg BID  DYSLIPIDEMIA  Continue simvastatin  GERD  Continue protonix Anxiety and depression  Now on amitriptyline, imipramine, fluoxetine and wellbutrin  CAP (community acquired pneumonia)  Continue Levaquin Thrombocytopenia  Unclear etiology  No signs of bleed  Mild, platelets 139  Code Status: full code Family Communication: no family at bedside Disposition Plan: home when stable  Manson Passey, MD  [facility] Pager [phone]  If 7PM-7AM, please contact night-coverage [email or web address] Password [email or web address] 12/17/2012, 6:09 PM   LOS: 2 days   Consultants:  Psychiatry  Other consultants:  Diabetic coordinator  Procedures:  None   Antibiotics:  Levaquin 12/15/2012 -->  HPI/Subjective: No acute overnight events.  Objective: Filed Vitals:   12/17/12 0520 12/17/12 1019 12/17/12 1352 12/17/12 1802  BP: 122/63 115/57 115/67 118/68  Pulse: 76 102 94 92  Temp: 97.8 F (36.6 C) 97.4 F (36.3 C) 97.6 F (36.4 C) 98.1 F (36.7  C)  TempSrc: Oral Oral Oral Oral  Resp: 16 20 20 20   Height:      Weight:      SpO2: 100% 100% 99% 98%    Intake/Output Summary (Last 24 hours) at 12/17/12 1809 Last data filed at 12/17/12 1600  Gross per 24 hour  Intake 1411.25 ml  Output   1450 ml  Net -38.75 ml    Exam:   General:  Pt is alert, follows commands appropriately, not in acute distress  Cardiovascular: Regular rate and rhythm, S1/S2, no murmurs, no rubs, no gallops  Respiratory: Clear to auscultation bilaterally, no wheezing, no crackles, no rhonchi  Abdomen: Soft, non tender, non distended, bowel sounds present, no guarding  Extremities: No edema, pulses DP and PT palpable bilaterally  Neuro: Grossly nonfocal  Data Reviewed: Basic Metabolic Panel:  Lab 12/17/12 3244 12/16/12 0630 12/16/12 0103 12/14/12 0818  NA 137 137 134* 140  K 4.0 4.1 5.1 4.0  CL 106 104 102 105  CO2 24 24 23  --  GLUCOSE 105* 158* 232* 184*  BUN 14 14 16 10   CREATININE 0.76 0.76 0.76 0.80  CALCIUM 8.6 8.7 8.7 --  MG -- -- -- --  PHOS -- -- -- --   Liver Function Tests:  Lab 12/16/12 0630  AST 14  ALT 14  ALKPHOS 85  BILITOT 0.2*  PROT 6.3  ALBUMIN 2.7*   No results found for this basename: LIPASE:5,AMYLASE:5 in the last 168 hours No results found for this basename: AMMONIA:5 in the last 168 hours CBC:  Lab 12/17/12 0402 12/16/12 0630 12/16/12 0103 12/14/12 0818 12/14/12 0800  WBC 8.4 10.6* 10.9* -- 12.9*  NEUTROABS -- 9.4* 10.0* -- 11.3*  HGB  9.3* 10.1* 10.6* 13.6 13.0  HCT 29.4* 30.7* 31.2* 40.0 39.4  MCV 96.7 94.5 92.9 -- 94.5  PLT 139* 131* 163 -- 115*   CBG:  Lab 12/17/12 1632 12/17/12 1136 12/17/12 0730 12/16/12 2240 12/16/12 1737  GLUCAP 136* 178* 96 170* 153*    No results found for this or any previous visit (from the past 240 hour(s)).   Studies: Ct Head Wo Contrast 12/16/2012   IMPRESSION: Stable exam.  Mild atrophy.  No post traumatic features.   Original Report Authenticated By: Davonna Belling, M.D.     Scheduled Meds:   . amitriptyline  100 mg Oral QHS  . buPROPion  150 mg Oral Daily  . FLUoxetine  40 mg Oral q morning - 10a  . imipramine  50 mg Oral QHS  . insulin aspart  0-5 Units Subcutaneous QHS  . insulin aspart  0-9 Units Subcutaneous TID WC  . levofloxacin  750 mg Oral QHS  . pantoprazole  40 mg Oral Daily  . simvastatin  40 mg Oral q1800   Continuous Infusions:   . sodium chloride 75 mL/hr at 12/17/12 0537

## 2012-12-18 DIAGNOSIS — F329 Major depressive disorder, single episode, unspecified: Secondary | ICD-10-CM

## 2012-12-18 DIAGNOSIS — F3130 Bipolar disorder, current episode depressed, mild or moderate severity, unspecified: Secondary | ICD-10-CM

## 2012-12-18 LAB — INFLUENZA PANEL BY PCR (TYPE A & B): H1N1 flu by pcr: NOT DETECTED

## 2012-12-18 LAB — GLUCOSE, CAPILLARY
Glucose-Capillary: 144 mg/dL — ABNORMAL HIGH (ref 70–99)
Glucose-Capillary: 117 mg/dL — ABNORMAL HIGH (ref 70–99)

## 2012-12-18 MED ORDER — IMIPRAMINE HCL 25 MG PO TABS
25.0000 mg | ORAL_TABLET | Freq: Every day | ORAL | Status: DC
  Administered 2012-12-18: 25 mg via ORAL
  Filled 2012-12-18 (×2): qty 1

## 2012-12-18 MED ORDER — LIVING WELL WITH DIABETES BOOK
Freq: Once | Status: AC
  Administered 2012-12-18: 17:00:00
  Filled 2012-12-18: qty 1

## 2012-12-18 NOTE — Progress Notes (Signed)
12/18/12  Spoke with patient about the diagnosis of diabetes.  HgbA1c is 6.5% which is a standard diagnosis of diabetes by the American Diabetes Association.  Suggest that patient see her PCP, Dr. Aida Puffer, as soon as discharged for follow up on diabetes diagnosis.  Dietician has seen patient and will follow up in hospital.  DM outpatient education ordered. Prescription for a home blood glucose meter, strips, lancets, etc. Is on shadow chart.  Staff nurse aware of prescription to be signed by physician. Staff RNs to have patient watch DM videos#[id number], give patient DM [place] notes, teach patient how to check own CBG.  Will continue to follow while in hospital.   Smith Mince RN BSN CDE

## 2012-12-18 NOTE — Progress Notes (Signed)
Patient educated about diabetes, provided with information about high and low blood sugars, importance of checking blood sugars, diabetic diet, and foot care. Informational hand outs provided to patient.  Patient also viewed diabetes educational videos, and practiced checking blood sugar with RN present and observing.  Patient encouraged to look over all information provided and ask questions as they arise.  Currently patient expresses concern about the the possibility of a diabetes diagnosis and wishes to know if the diabetes will go away after she gets better and leaves the hospital. RN explained that diabetes can be controlled with diet and medication regimen as dictated by the physician. Patient agrees to continue to practice checking blood sugars, and agrees to do her own blood sugar check this evening with observation.

## 2012-12-18 NOTE — Evaluation (Signed)
Physical Therapy Evaluation Patient Details Name: Kendra Simmons MRN: [id number] DOB: [birth year] Today's Date: 12/18/2012 Time: 4098-1191 PT Time Calculation (min): 16 min  PT Assessment / Plan / Recommendation Clinical Impression  Pt. was admitted 12/30 with confusion and hypoxia, pneumonia. for r/o flu. Pt was independent and plans to return home. has family support. Continue PT whie in acute care.    PT Assessment  Patient needs continued PT services    Follow Up Recommendations  Home health PT (may not be necessary)    Does the patient have the potential to tolerate intense rehabilitation      Barriers to Discharge Decreased caregiver support      Equipment Recommendations  None recommended by PT    Recommendations for Other Services     Frequency Min 3X/week    Precautions / Restrictions Precautions Precaution Comments: droplet   Pertinent Vitals/Pain sats after ambulation 98% ra     Mobility  Bed Mobility Bed Mobility: Supine to Sit;Sit to Supine Supine to Sit: 7: Independent Sit to Supine: 7: Independent Transfers Transfers: Sit to Stand;Stand to Sit Sit to Stand: 5: Supervision Stand to Sit: 7: Independent Ambulation/Gait Ambulation/Gait Assistance: 4: Min guard Ambulation Distance (Feet): 150 Feet Assistive device: 1 person hand held assist Ambulation/Gait Assistance Details: gait slow due to dyspnea, unsteady initially without UE support. Gait Pattern: Step-through pattern;Decreased stride length Gait velocity: decreased    Shoulder Instructions     Exercises     PT Diagnosis: Difficulty walking;Generalized weakness  PT Problem List: Decreased strength;Decreased activity tolerance;Decreased mobility;Decreased balance;Cardiopulmonary status limiting activity;Decreased safety awareness PT Treatment Interventions: DME instruction;Gait training;Functional mobility training;Therapeutic activities;Patient/family education;Balance training   PT  Goals Acute Rehab PT Goals PT Goal Formulation: With patient Time For Goal Achievement: 01/01/13 Potential to Achieve Goals: Good Pt will Ambulate: >150 feet;with modified independence PT Goal: Ambulate - Progress: Goal set today  Visit Information  Last PT Received On: 12/18/12 Assistance Needed: +1    Subjective Data  Subjective: I hope i don't have the flu Patient Stated Goal: to go home   Prior Functioning  Home Living Lives With: Alone Available Help at Discharge: Family;Available PRN/intermittently Type of Home: House Home Access: Level entry Home Layout: One level Bathroom Toilet: Standard Home Adaptive Equipment: None Prior Function Level of Independence: Independent Driving: Yes Vocation: Part time employment    Cognition  Overall Cognitive Status: Appears within functional limits for tasks assessed/performed Arousal/Alertness: Awake/alert Orientation Level: Appears intact for tasks assessed Behavior During Session: [facility] for tasks performed    Extremity/Trunk Assessment Right Upper Extremity Assessment RUE ROM/Strength/Tone: [facility] for tasks assessed Left Upper Extremity Assessment LUE ROM/Strength/Tone: WFL for tasks assessed Right Lower Extremity Assessment RLE ROM/Strength/Tone: [facility] for tasks assessed Left Lower Extremity Assessment LLE ROM/Strength/Tone: WFL for tasks assessed Trunk Assessment Trunk Assessment: Normal   Balance Dynamic Sitting Balance Dynamic Sitting - Level of Assistance: 7: Independent  End of Session PT - End of Session Activity Tolerance: Patient limited by fatigue (dyspnea.) Patient left: in bed;with call bell/phone within reach Nurse Communication: Mobility status  GP     Rada Hay 12/18/2012, 3:57 PM  [phone]

## 2012-12-18 NOTE — Plan of Care (Signed)
Problem: Food- and Nutrition-Related Knowledge Deficit (NB-1.1) Goal: Nutrition education Formal process to instruct or train a patient/client in a skill or to impart knowledge to help patients/clients voluntarily manage or modify food choices and eating behavior to maintain or improve health.  Outcome: Completed/Met Date Met:  12/18/12 Met with pt to discuss prediabetic diet per MD consult. Pt lives alone. Pt reports eating small portions of carbohydrate containing foods at home and eats 3 meals/day, primarily frozen meals. Discussed amount of sodium in frozen meals and the relationship between sodium and water weight retention. Pt reports 17 pound unintended weight gain in the past 3 weeks from being on Abilify. Pt reports she has been drinking more soda recently. Pt reports she does not exercise. Discussed sources of carbohydrates in the diet with recommended portion sizes. Teach back method used. Provided handouts of prediabetic diet. Pt interested in seeing an RD in an outpatient setting - will order outpatient DM consult. Pt expressed understanding. Expect good compliance.

## 2012-12-18 NOTE — ED Provider Notes (Signed)
Medical screening examination/treatment/procedure(s) were performed by non-physician practitioner and as supervising physician I was immediately available for consultation/collaboration.   Hanley Seamen, MD 12/18/12 2240
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
## 2012-12-19 LAB — GLUCOSE, CAPILLARY
Glucose-Capillary: 90 mg/dL (ref 70–99)
Glucose-Capillary: 91 mg/dL (ref 70–99)

## 2012-12-19 MED ORDER — METFORMIN HCL 500 MG PO TABS: 500.0000 mg | ORAL_TABLET | Freq: Two times a day (BID) | ORAL | Status: DC
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
## 2012-12-22 NOTE — Progress Notes (Signed)
Discharge summary sent to payer through [facility]  

## 2012-12-30 ENCOUNTER — Ambulatory Visit (INDEPENDENT_AMBULATORY_CARE_PROVIDER_SITE_OTHER): Payer: Medicare Other | Admitting: Physician Assistant

## 2012-12-30 DIAGNOSIS — F411 Generalized anxiety disorder: Secondary | ICD-10-CM

## 2012-12-30 DIAGNOSIS — M1710 Unilateral primary osteoarthritis, unspecified knee: Secondary | ICD-10-CM | POA: Diagnosis not present

## 2012-12-30 DIAGNOSIS — M25569 Pain in unspecified knee: Secondary | ICD-10-CM | POA: Diagnosis not present

## 2012-12-30 DIAGNOSIS — F331 Major depressive disorder, recurrent, moderate: Secondary | ICD-10-CM

## 2012-12-30 NOTE — Progress Notes (Signed)
Patient ID: Kendra Simmons, female   DOB: [birth year], 62 y.o.   MRN: [id number] Patient did not attend appointment.  Erroneous encounter.

## 2013-01-01 DIAGNOSIS — J13 Pneumonia due to Streptococcus pneumoniae: Secondary | ICD-10-CM | POA: Diagnosis not present

## 2013-01-01 DIAGNOSIS — R5383 Other fatigue: Secondary | ICD-10-CM | POA: Diagnosis not present

## 2013-01-01 DIAGNOSIS — F060 Psychotic disorder with hallucinations due to known physiological condition: Secondary | ICD-10-CM | POA: Diagnosis not present

## 2013-01-01 DIAGNOSIS — R5381 Other malaise: Secondary | ICD-10-CM | POA: Diagnosis not present

## 2013-01-05 ENCOUNTER — Telehealth (HOSPITAL_COMMUNITY): Payer: Self-pay

## 2013-01-05 NOTE — Telephone Encounter (Signed)
Called patient as she had left 5 messages over the weekend. Patient's speech was very slurred, and when asked about this she stated she had just woken up. She explains she had missed her appointment last week do to being hospitalized for pneumonia. Stated she had stopped taking the Abilify. Asked if it was okay if she had her primary care physician continue her other medications. Patient has no followup appointment with this provider, and was told to followup with her primary care provider. If patient needs prescriptions until able to get into see her primary care provider, this provider will provide 30 days.

## 2013-01-06 ENCOUNTER — Telehealth (HOSPITAL_COMMUNITY): Payer: Self-pay | Admitting: *Deleted

## 2013-01-06 NOTE — Telephone Encounter (Signed)
Patient left VM 01/05/13 to notify office that she would be seeing PCP for medication management and to request provider in this office contact her PCP with her current Wellbutrin dosage. Contacted pt and requested she have PCP request records. Gave fax number for this office.

## 2013-01-13 ENCOUNTER — Other Ambulatory Visit: Payer: Self-pay | Admitting: Family Medicine

## 2013-01-13 DIAGNOSIS — M25562 Pain in left knee: Secondary | ICD-10-CM

## 2013-01-14 ENCOUNTER — Other Ambulatory Visit (HOSPITAL_COMMUNITY): Payer: Self-pay | Admitting: Physician Assistant

## 2013-01-20 ENCOUNTER — Other Ambulatory Visit: Payer: Self-pay

## 2013-01-29 ENCOUNTER — Ambulatory Visit: Payer: Self-pay | Admitting: *Deleted

## 2013-02-06 ENCOUNTER — Ambulatory Visit
Admission: RE | Admit: 2013-02-06 | Discharge: 2013-02-06 | Disposition: A | Discharge status: Home / Self Care | Payer: Medicare Other | Source: Ambulatory Visit | Attending: Family Medicine | Admitting: Family Medicine

## 2013-02-06 DIAGNOSIS — M1710 Unilateral primary osteoarthritis, unspecified knee: Secondary | ICD-10-CM | POA: Diagnosis not present

## 2013-02-06 DIAGNOSIS — IMO0002 Reserved for concepts with insufficient information to code with codable children: Secondary | ICD-10-CM | POA: Diagnosis not present

## 2013-02-06 DIAGNOSIS — M25562 Pain in left knee: Secondary | ICD-10-CM

## 2013-02-12 ENCOUNTER — Encounter: Payer: Self-pay | Admitting: Dietician

## 2013-02-12 ENCOUNTER — Encounter: Payer: Medicare Other | Attending: Family Medicine | Admitting: Dietician

## 2013-02-12 VITALS — Ht 64.5 in | Wt 206.4 lb

## 2013-02-12 DIAGNOSIS — Z713 Dietary counseling and surveillance: Secondary | ICD-10-CM | POA: Diagnosis not present

## 2013-02-12 DIAGNOSIS — E119 Type 2 diabetes mellitus without complications: Secondary | ICD-10-CM | POA: Insufficient documentation

## 2013-02-12 NOTE — Patient Instructions (Addendum)
   With your knees, exercise in the water will be less stressful on your knees.  Consider going to a water aerobics class.  [employer] [address] is suppose to have warmer water. Think about applying for a scholarship.  Call and inquire about a scholarship and have them send you an application.  MD prescription???  Can consider walking in the water.   Consider getting an appointment with Dr. Clarene Duke to have him follow your diabetes.  Check fasting glucose levels and record. Aim for 80-120 mg levels. Take to your MD appointments.  Aim for a bedtime glucose at 100-140.  If less than 100 have a snack.  Add sometimes checking after breakfast.  (After meals, check 2 hours after the first bite of the meal.  Breakfast starts at 9:00, then check at 11:00 AM.0 Check after a large meal.)  Try to eat no longer than 5 hours between meals.  Read your food labels.  Sim for fiber in foods.  Bread= 2 gm fiber per slice  Cereals/grains- 3+ gm per serving.  Sugar: try to keep the level at (0-9 gm) per serving.  Try the diet juices.  Compare the sugar content of the [place] Lite with the Diet V8 Splash and the Diet Ocean Spray juices. You want to keep the sugar at less than 9 gms and the total carb at 3 gm or less.  If you want to have a sweet (pie, cake, candy) you need to monitor the portion size, then calculate it in with your meal carb and do it at meal time, not at a separate time,  Plan to practice measuring the starches, fruits, and dairy.  Aim for 30-45 gm of carb at breakfast and dinner.  Aim for 30 gm at lunch.  Plan to have 15 gm of carb at between meal snacks.  Plan to have a protein food at all meals and snacks.  Plan to follow-up with me in 4-6 weeks.

## 2013-02-12 NOTE — Progress Notes (Signed)
Medical Nutrition Therapy:  Appt start time: 1600 end time:  1730.   Assessment:  Primary concerns today: Comes today to learn more about taking care of her diabetes and to get some help with losing some weight.. Review of records is confusing.  She was to be seen at [facility] on 10/21/2012 and was a no show.  At that time her A1C was reported at 12.6%.  Today, having been in the hospital with pneumonia, her A1C is reported at 6.5%.  Reports that Dr. Lesly Rubenstein at [facility] is her primary physician.  She has lost about 13-14 lb since her hospitalization.  She lives alone, but her sister "lives on the property and helps her with making sure she gets the right medications each day."  Currently is taking a number of medications to assist with stabilizing her mood and one to help with facilitating her memory.  Comes  Today with a history of anxiety, depression, GERD, dyslipidemia.  BLOOD GLUCOSE MONITORING:  Checking 2 times per day.  Does not have meter or glucose records with her today.  Reports the following.  After breakfast: 100-135 mg (about 2 hours after breakfast)  Bedtime: About 135 mg  PRN:  200 mg following having a slice of lemon pie  HYPOGLYCEMIA:  Reports some incidents of feeling shaky and dizzy.  HYPERGLYCEMIA: Has had a UTI and with that, increased thirst, increased urination, dry skin, hungry, sleepy and double vision.  FOOT SELF-EXAM: Completing daily.  DILATED EYE-EXAM:  Last exam was 1.5 years ago.     MEDICATIONS: Completed med review.  Currently on Metformin.  Prescribed 500 in the AM and 500 in the PM.  Currently taking 500 mg once daily.   DIETARY INTAKE:  Usual eating pattern includes 1-2 meals and 2-3 snacks per day.  24-hr recall:  B ( AM): 8:00 bowl of raisin bran (1.5 cups) with milk (2%, 4 oz), Tropicana (40% less sugar) 8 oz.  Snk ( AM): Rare, sugar wafer cookies, 2.  L ( PM): Sometimes skips, Eats mid-day about 4 out of 7 days.  Salad (green with chicken and cheese,  tomato, and low fat diet ranch), Or [place] Gum Nat Lowenthal be used.   Water to drink.  Snk ( PM): mid Steffani Dionisio have an apple, Oria Klimas have the sugar free cookies. D ( PM): 6:00 Often have soft tortilla with cheese, salad mix and tomatoes roll.  (1-2) water OR Smart Ones dinner with the noodles and spaghetti in it.  TV dinners with the the noodles and sauce. Snk ( PM): Jeramie Scogin have 1-2 packs of packs of the Nab crackers with water and maybe an apple. Beverages: water, lite juice.   Usual physical activity: Currently no planned exercise.  Estimated energy needs:  HT: 64.5 in  WT: 206.4 lb  BMI: 35 kg/m2  Adj WT: 149 lb (68 kg) 1300-1400 calories 150-155 g carbohydrates 100-105 g protein 36-38 g fat  Progress Towards Goal(s):  In progress.   Nutritional Diagnosis:  [place]-2.1 Inpaired nutrition utilization As related to glucose.  As evidenced by diagnosis of type 2 diabetes, A1c at 6.5%, and medications for glucose control..    Interventions:  Nutrition/Diabetes Education:  With your knees, exercise in the water will be less stressful on your knees.  Consider going to a water aerobics class.  [employer] [facility] [address] is suppose to have warmer water. Think about applying for a scholarship.  Call and inquire about a scholarship and have them send you an application.  MD prescription???  Can consider walking in the water.   Consider getting an appointment with Dr. Clarene Duke to have him follow your diabetes.  Check fasting glucose levels and record. Aim for 80-120 mg levels. Take to your MD appointments.  Aim for a bedtime glucose at 100-140.  If less than 100 have a snack.  Add sometimes checking after breakfast.  (After meals, check 2 hours after the first bite of the meal.  Breakfast starts at 9:00, then check at 11:00 AM.0 Check after a large meal.)  Try to eat no longer than 5 hours between meals.  Read your food labels.  Sim for fiber in foods.  Bread= 2 gm fiber per slice  Cereals/grains- 3+ gm per serving.  Sugar: try to  keep the level at (0-9 gm) per serving.  Try the diet juices.  Compare the sugar content of the Tropicana Lite with the Diet V8 Splash and the Diet Ocean Spray juices. You want to keep the sugar at less than 9 gms and the total carb at 3 gm or less.  If you want to have a sweet (pie, cake, candy) you need to monitor the portion size, then calculate it in with your meal carb and do it at meal time, not at a separate time,  Plan to practice measuring the starches, fruits, and dairy.  Aim for 30-45 gm of carb at breakfast and dinner.  Aim for 30 gm at lunch.  Plan to have 15 gm of carb at between meal snacks.  Plan to have a protein food at all meals and snacks.  Plan to follow-up with me in 4-6 weeks.   Handouts given during visit include:  Living Well with Diabetes  Carb Counting Guide  Controlling Blood Glucose  Yellow Card with Diet prescription and exchange list.  Menu examples  Snack list   Monitoring/Evaluation:  Dietary intake, exercise, blood glucose levels, and body weight in 4-6 weeks.

## 2013-02-18 DIAGNOSIS — M1710 Unilateral primary osteoarthritis, unspecified knee: Secondary | ICD-10-CM | POA: Diagnosis not present

## 2013-02-18 DIAGNOSIS — M25579 Pain in unspecified ankle and joints of unspecified foot: Secondary | ICD-10-CM | POA: Diagnosis not present

## 2013-03-20 ENCOUNTER — Telehealth: Payer: Self-pay | Admitting: Neurology

## 2013-03-20 DIAGNOSIS — I714 Abdominal aortic aneurysm, without rupture: Secondary | ICD-10-CM

## 2013-03-20 DIAGNOSIS — F068 Other specified mental disorders due to known physiological condition: Secondary | ICD-10-CM

## 2013-03-20 MED ORDER — MEMANTINE HCL 10 MG PO TABS: 10.0000 mg | ORAL_TABLET | Freq: Two times a day (BID) | ORAL | Status: DC

## 2013-03-20 NOTE — Telephone Encounter (Signed)
Medication Namenda refill E scribed to [facility] [address]. Pt to be called by Brett Canales, CMA for appt.

## 2013-03-30 ENCOUNTER — Encounter: Payer: Medicare Other | Admitting: Neurology

## 2013-04-29 DIAGNOSIS — E119 Type 2 diabetes mellitus without complications: Secondary | ICD-10-CM | POA: Diagnosis not present

## 2013-04-29 DIAGNOSIS — F411 Generalized anxiety disorder: Secondary | ICD-10-CM | POA: Diagnosis not present

## 2013-04-29 DIAGNOSIS — E782 Mixed hyperlipidemia: Secondary | ICD-10-CM | POA: Diagnosis not present

## 2013-05-01 DIAGNOSIS — E119 Type 2 diabetes mellitus without complications: Secondary | ICD-10-CM | POA: Diagnosis not present

## 2013-05-01 DIAGNOSIS — F0390 Unspecified dementia without behavioral disturbance: Secondary | ICD-10-CM | POA: Diagnosis not present

## 2013-05-12 ENCOUNTER — Telehealth: Payer: Self-pay | Admitting: Neurology

## 2013-05-26 ENCOUNTER — Ambulatory Visit: Payer: Self-pay | Admitting: Neurology

## 2013-05-27 DIAGNOSIS — R55 Syncope and collapse: Secondary | ICD-10-CM | POA: Diagnosis not present

## 2013-05-27 DIAGNOSIS — E119 Type 2 diabetes mellitus without complications: Secondary | ICD-10-CM | POA: Diagnosis not present

## 2013-05-27 DIAGNOSIS — E782 Mixed hyperlipidemia: Secondary | ICD-10-CM | POA: Diagnosis not present

## 2013-06-11 DIAGNOSIS — M25579 Pain in unspecified ankle and joints of unspecified foot: Secondary | ICD-10-CM | POA: Diagnosis not present

## 2013-06-15 ENCOUNTER — Ambulatory Visit: Payer: Medicare Other | Admitting: Neurology

## 2013-06-17 ENCOUNTER — Other Ambulatory Visit: Payer: Self-pay

## 2013-06-17 DIAGNOSIS — F068 Other specified mental disorders due to known physiological condition: Secondary | ICD-10-CM

## 2013-06-17 MED ORDER — MEMANTINE HCL 10 MG PO TABS: 10.0000 mg | ORAL_TABLET | Freq: Two times a day (BID) | ORAL | Status: DC

## 2013-07-14 ENCOUNTER — Ambulatory Visit (INDEPENDENT_AMBULATORY_CARE_PROVIDER_SITE_OTHER): Payer: Medicare Other | Admitting: Radiology

## 2013-07-14 ENCOUNTER — Ambulatory Visit (INDEPENDENT_AMBULATORY_CARE_PROVIDER_SITE_OTHER): Payer: Medicare Other | Admitting: Neurology

## 2013-07-14 DIAGNOSIS — R200 Anesthesia of skin: Secondary | ICD-10-CM

## 2013-07-14 DIAGNOSIS — R269 Unspecified abnormalities of gait and mobility: Secondary | ICD-10-CM | POA: Diagnosis not present

## 2013-07-14 DIAGNOSIS — Z0289 Encounter for other administrative examinations: Secondary | ICD-10-CM

## 2013-07-14 DIAGNOSIS — R209 Unspecified disturbances of skin sensation: Secondary | ICD-10-CM

## 2013-07-14 DIAGNOSIS — M79609 Pain in unspecified limb: Secondary | ICD-10-CM

## 2013-07-14 NOTE — Procedures (Signed)
    [facility]  NCS (NERVE CONDUCTION STUDY) WITH EMG (ELECTROMYOGRAPHY) REPORT   STUDY DATE: 07/14/2013 PATIENT NAME: Kendra Simmons DOB: 1951-06-30 MRN: [id number]    TECHNOLOGIST: Kaylyn Lim ELECTROMYOGRAPHER: Levert Feinstein M.D.  CLINICAL INFORMATION:   62 years old Caucasian female, with bilateral plantar feet paresthesiafor 2 months, she has history of diabetes   On examination: Bilateral lower extremity motor strength was normal, including bilateral total extension, flexion  FINDINGS: NERVE CONDUCTION STUDY: Bilateral medial, and and lateral plantar sensory responses were absent.  Bilateral peroneal to EDB, tibial motor responses were normal. Bilateral sural sensory response was normal.  Left median, ulnar sensory and motor responses were normal.  NEEDLE ELECTROMYOGRAPHY: Selected needle examination was performed at left lower extremity muscles, and left lumbosacral paraspinal muscles.  Needle examination of left tibialis anterior, tibialis posterior, medial gastrocnemius, vastus lateralis, biceps femoris long head was normal.  There was no spontaneous activity at left lumbosacral paraspinal muscles, left L4, L5, S1.  IMPRESSION:  This is a slight abnormal study, absent bilateral plantar sensory responses indicate a possibility of length dependent small fiber neuropathy, there is no evidence of large fiber neuropathy, no evidence of left lumbosacral radiculopathy.   INTERPRETING PHYSICIAN:   Levert Feinstein M.D. Ph.D. [facility] [address], [address] [place], [place] [address] [phone]

## 2013-07-21 ENCOUNTER — Telehealth: Payer: Self-pay | Admitting: Neurology

## 2013-07-21 NOTE — Telephone Encounter (Signed)
Transfer of care Dr. Love needs to be reassigned per Dr. Athar °

## 2013-07-22 NOTE — Telephone Encounter (Signed)
Assigned to Dr. Yan. 

## 2013-07-23 ENCOUNTER — Telehealth: Payer: Self-pay | Admitting: Neurology

## 2013-07-29 DIAGNOSIS — M25569 Pain in unspecified knee: Secondary | ICD-10-CM | POA: Diagnosis not present

## 2013-07-29 DIAGNOSIS — Z79899 Other long term (current) drug therapy: Secondary | ICD-10-CM | POA: Diagnosis not present

## 2013-07-29 DIAGNOSIS — G609 Hereditary and idiopathic neuropathy, unspecified: Secondary | ICD-10-CM | POA: Diagnosis not present

## 2013-07-29 DIAGNOSIS — E119 Type 2 diabetes mellitus without complications: Secondary | ICD-10-CM | POA: Diagnosis not present

## 2013-07-29 DIAGNOSIS — G608 Other hereditary and idiopathic neuropathies: Secondary | ICD-10-CM | POA: Diagnosis not present

## 2013-07-29 DIAGNOSIS — M25579 Pain in unspecified ankle and joints of unspecified foot: Secondary | ICD-10-CM | POA: Diagnosis not present

## 2013-07-29 DIAGNOSIS — M1710 Unilateral primary osteoarthritis, unspecified knee: Secondary | ICD-10-CM | POA: Diagnosis not present

## 2013-08-27 ENCOUNTER — Other Ambulatory Visit (HOSPITAL_COMMUNITY): Payer: Self-pay | Admitting: Family Medicine

## 2013-08-27 ENCOUNTER — Ambulatory Visit: Payer: Medicare Other | Admitting: Neurology

## 2013-08-27 ENCOUNTER — Ambulatory Visit (HOSPITAL_COMMUNITY)
Admission: RE | Admit: 2013-08-27 | Discharge: 2013-08-27 | Disposition: A | Discharge status: Home / Self Care | Payer: Medicare Other | Source: Ambulatory Visit | Attending: Family Medicine | Admitting: Family Medicine

## 2013-08-27 DIAGNOSIS — Z0000 Encounter for general adult medical examination without abnormal findings: Secondary | ICD-10-CM

## 2013-08-27 DIAGNOSIS — Z1231 Encounter for screening mammogram for malignant neoplasm of breast: Secondary | ICD-10-CM

## 2013-08-31 DIAGNOSIS — F070 Personality change due to known physiological condition: Secondary | ICD-10-CM | POA: Diagnosis not present

## 2013-08-31 DIAGNOSIS — E119 Type 2 diabetes mellitus without complications: Secondary | ICD-10-CM | POA: Diagnosis not present

## 2013-08-31 DIAGNOSIS — F060 Psychotic disorder with hallucinations due to known physiological condition: Secondary | ICD-10-CM | POA: Diagnosis not present

## 2013-08-31 DIAGNOSIS — G909 Disorder of the autonomic nervous system, unspecified: Secondary | ICD-10-CM | POA: Diagnosis not present

## 2013-09-15 DIAGNOSIS — R5383 Other fatigue: Secondary | ICD-10-CM | POA: Diagnosis not present

## 2013-09-15 DIAGNOSIS — F060 Psychotic disorder with hallucinations due to known physiological condition: Secondary | ICD-10-CM | POA: Diagnosis not present

## 2013-09-15 DIAGNOSIS — F0390 Unspecified dementia without behavioral disturbance: Secondary | ICD-10-CM | POA: Diagnosis not present

## 2013-09-15 DIAGNOSIS — R5381 Other malaise: Secondary | ICD-10-CM | POA: Diagnosis not present

## 2013-10-07 DIAGNOSIS — F060 Psychotic disorder with hallucinations due to known physiological condition: Secondary | ICD-10-CM | POA: Diagnosis not present

## 2013-10-07 DIAGNOSIS — E782 Mixed hyperlipidemia: Secondary | ICD-10-CM | POA: Diagnosis not present

## 2013-10-07 DIAGNOSIS — D518 Other vitamin B12 deficiency anemias: Secondary | ICD-10-CM | POA: Diagnosis not present

## 2013-10-07 DIAGNOSIS — F0390 Unspecified dementia without behavioral disturbance: Secondary | ICD-10-CM | POA: Diagnosis not present

## 2013-10-07 DIAGNOSIS — R5381 Other malaise: Secondary | ICD-10-CM | POA: Diagnosis not present

## 2013-10-07 DIAGNOSIS — R5383 Other fatigue: Secondary | ICD-10-CM | POA: Diagnosis not present

## 2013-10-14 DIAGNOSIS — R5381 Other malaise: Secondary | ICD-10-CM | POA: Diagnosis not present

## 2013-10-14 DIAGNOSIS — F060 Psychotic disorder with hallucinations due to known physiological condition: Secondary | ICD-10-CM | POA: Diagnosis not present

## 2013-10-14 DIAGNOSIS — E782 Mixed hyperlipidemia: Secondary | ICD-10-CM | POA: Diagnosis not present

## 2013-10-14 DIAGNOSIS — R5383 Other fatigue: Secondary | ICD-10-CM | POA: Diagnosis not present

## 2013-11-09 ENCOUNTER — Encounter: Payer: Self-pay | Admitting: Neurology

## 2013-11-10 ENCOUNTER — Ambulatory Visit: Payer: Medicare Other | Admitting: Neurology

## 2013-11-16 ENCOUNTER — Telehealth: Payer: Self-pay | Admitting: Neurology

## 2013-11-18 DIAGNOSIS — E782 Mixed hyperlipidemia: Secondary | ICD-10-CM | POA: Diagnosis not present

## 2013-11-18 DIAGNOSIS — F411 Generalized anxiety disorder: Secondary | ICD-10-CM | POA: Diagnosis not present

## 2013-11-18 DIAGNOSIS — R5381 Other malaise: Secondary | ICD-10-CM | POA: Diagnosis not present

## 2013-11-18 DIAGNOSIS — Z23 Encounter for immunization: Secondary | ICD-10-CM | POA: Diagnosis not present

## 2013-11-18 DIAGNOSIS — M722 Plantar fascial fibromatosis: Secondary | ICD-10-CM | POA: Diagnosis not present

## 2013-11-18 DIAGNOSIS — R5383 Other fatigue: Secondary | ICD-10-CM | POA: Diagnosis not present

## 2013-11-20 ENCOUNTER — Other Ambulatory Visit: Payer: Self-pay | Admitting: Neurology

## 2013-11-20 NOTE — Telephone Encounter (Signed)
Former Love patient who was assigned to Dr Frances Furbish, and per notes was reassigned to Dr Terrace Arabia.  Last phone note says patient would like to be reassigned to another MD.  Sending refill under Dr Terrace Arabia (current assigned provider) until patient gets reassigned again.  Patient will need an appt once a new MD is assigned.

## 2013-11-23 ENCOUNTER — Encounter: Payer: Self-pay | Admitting: Diagnostic Neuroimaging

## 2013-12-02 ENCOUNTER — Encounter (INDEPENDENT_AMBULATORY_CARE_PROVIDER_SITE_OTHER): Payer: Self-pay

## 2013-12-02 ENCOUNTER — Encounter: Payer: Self-pay | Admitting: Diagnostic Neuroimaging

## 2013-12-02 ENCOUNTER — Encounter: Payer: Medicare Other | Admitting: Diagnostic Neuroimaging

## 2013-12-08 DIAGNOSIS — F070 Personality change due to known physiological condition: Secondary | ICD-10-CM | POA: Diagnosis not present

## 2013-12-08 DIAGNOSIS — E782 Mixed hyperlipidemia: Secondary | ICD-10-CM | POA: Diagnosis not present

## 2013-12-08 DIAGNOSIS — R55 Syncope and collapse: Secondary | ICD-10-CM | POA: Diagnosis not present

## 2013-12-08 DIAGNOSIS — F060 Psychotic disorder with hallucinations due to known physiological condition: Secondary | ICD-10-CM | POA: Diagnosis not present

## 2013-12-08 DIAGNOSIS — R5381 Other malaise: Secondary | ICD-10-CM | POA: Diagnosis not present

## 2013-12-08 DIAGNOSIS — R5383 Other fatigue: Secondary | ICD-10-CM | POA: Diagnosis not present

## 2013-12-21 NOTE — Progress Notes (Signed)
This encounter was created in error - please disregard.

## 2013-12-23 DIAGNOSIS — E782 Mixed hyperlipidemia: Secondary | ICD-10-CM | POA: Diagnosis not present

## 2013-12-23 DIAGNOSIS — E669 Obesity, unspecified: Secondary | ICD-10-CM | POA: Diagnosis not present

## 2013-12-23 DIAGNOSIS — Z6833 Body mass index (BMI) 33.0-33.9, adult: Secondary | ICD-10-CM | POA: Diagnosis not present

## 2013-12-23 DIAGNOSIS — F070 Personality change due to known physiological condition: Secondary | ICD-10-CM | POA: Diagnosis not present

## 2013-12-23 DIAGNOSIS — R443 Hallucinations, unspecified: Secondary | ICD-10-CM | POA: Diagnosis not present

## 2013-12-23 DIAGNOSIS — R5383 Other fatigue: Secondary | ICD-10-CM | POA: Diagnosis not present

## 2013-12-23 DIAGNOSIS — R5381 Other malaise: Secondary | ICD-10-CM | POA: Diagnosis not present

## 2013-12-23 DIAGNOSIS — F060 Psychotic disorder with hallucinations due to known physiological condition: Secondary | ICD-10-CM | POA: Diagnosis not present

## 2013-12-30 IMAGING — CR DG ELBOW COMPLETE 3+V*L*
4 series · 4 of 4 positions shown · non-contrast
Comparison: Left elbow radiographs performed 12/13/2006

CLINICAL DATA: Status post fall down steps; bruising at the radial
aspect of the elbow.

LEFT ELBOW - COMPLETE 3+ VIEW

[x elbow ap left]
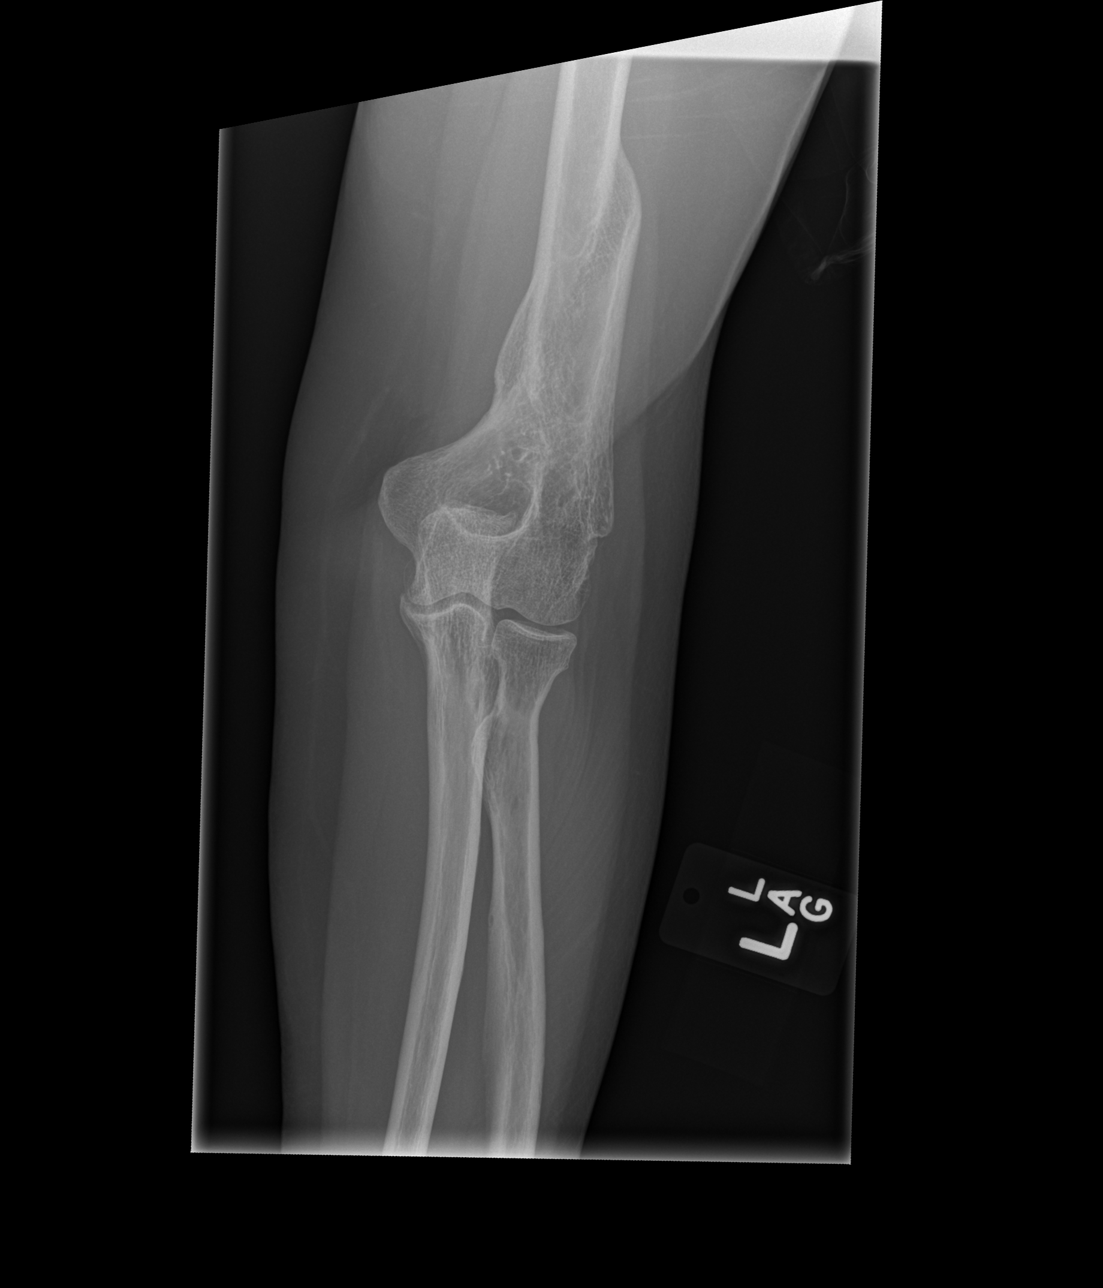

[x elbow obl left (1 of 2)]
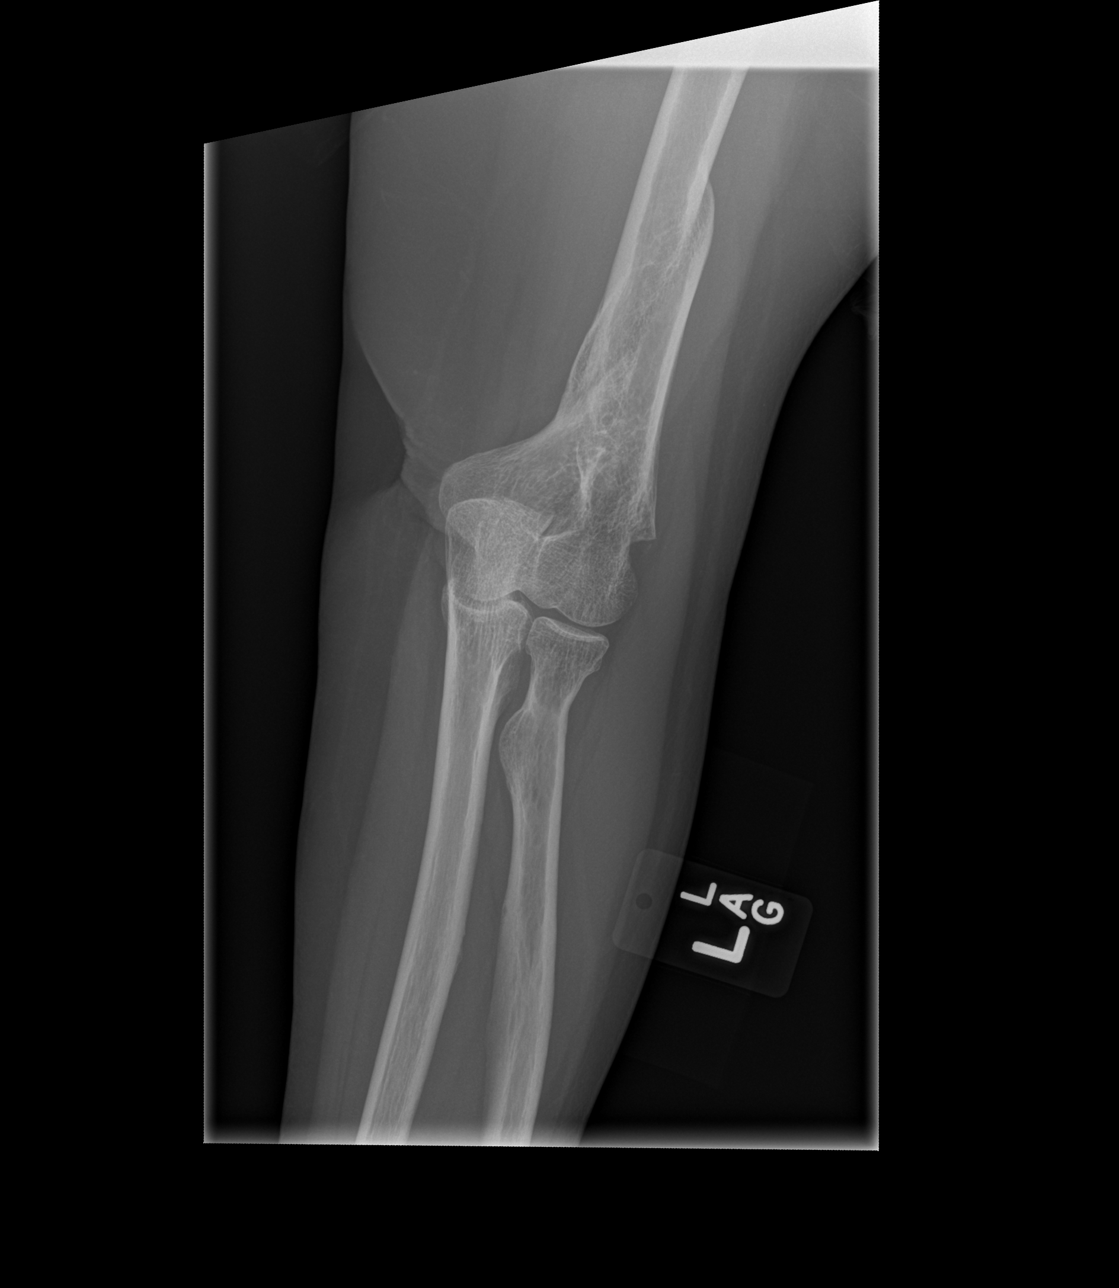

[x elbow obl left (2 of 2)]
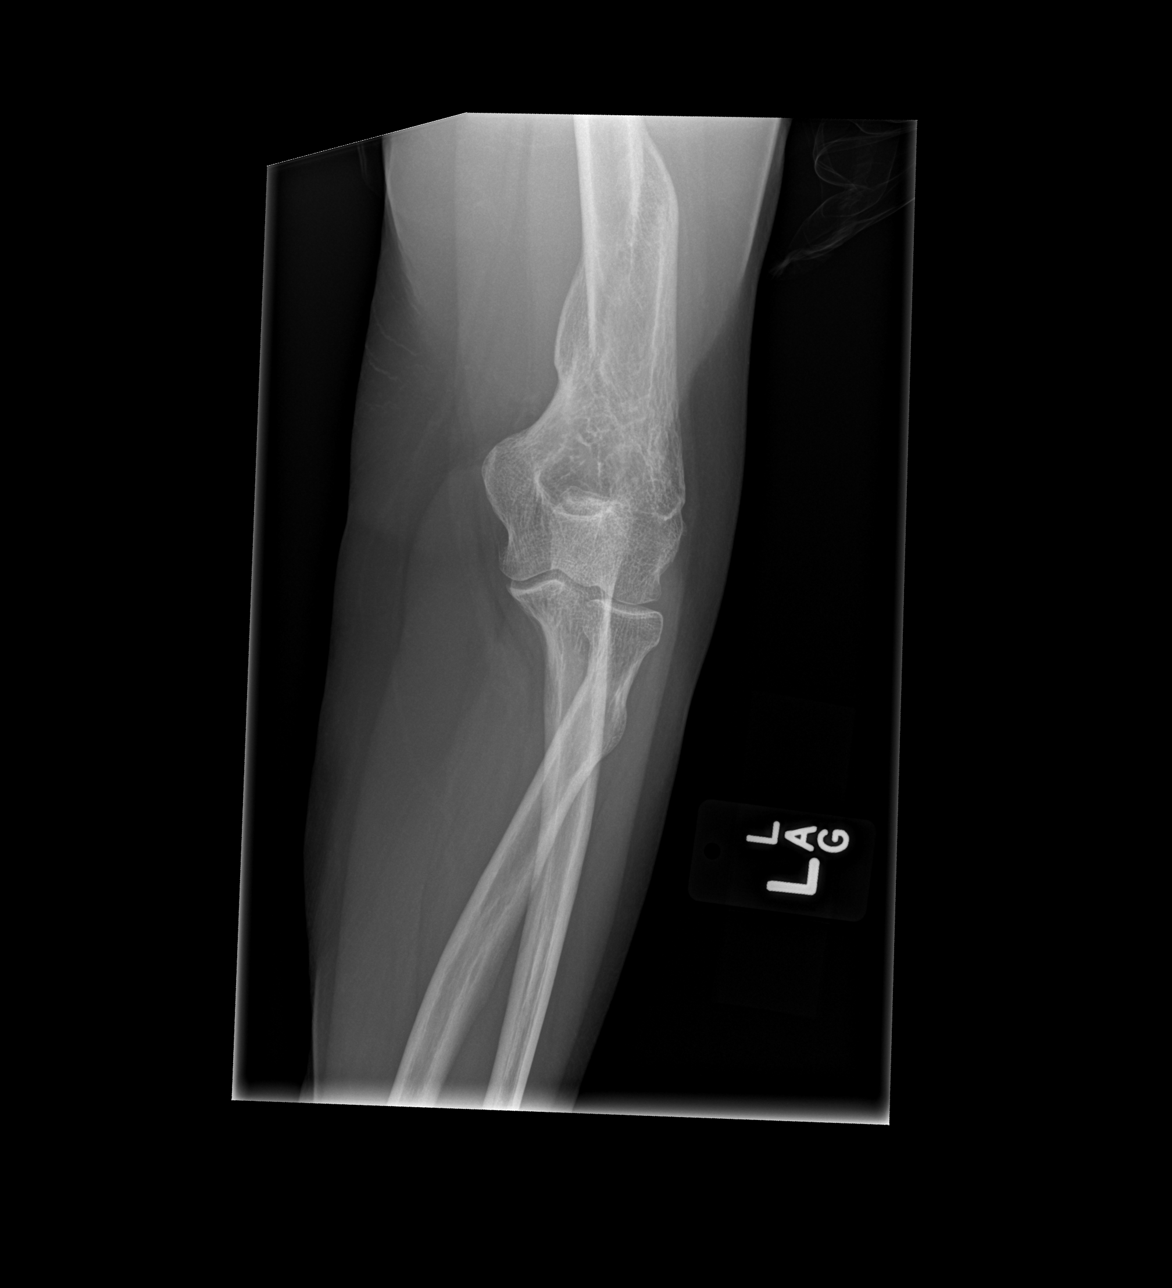

[x elbow lat left]
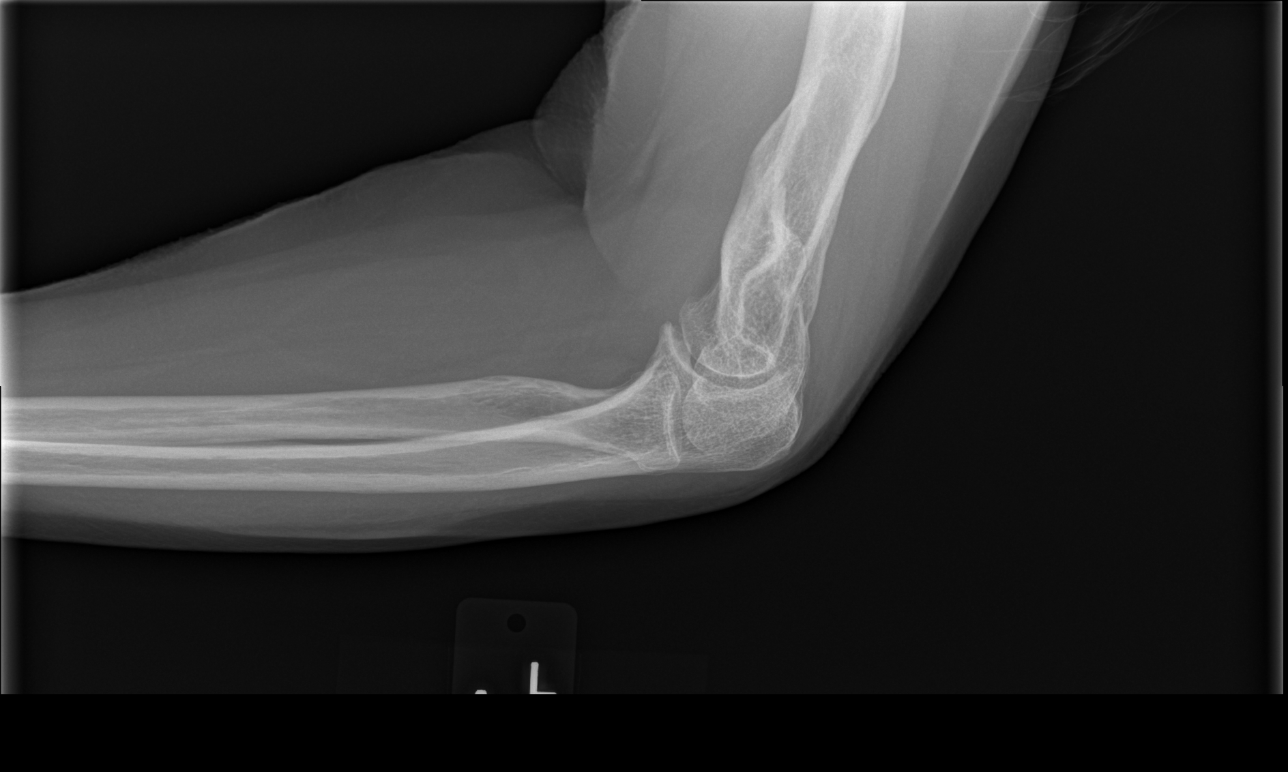

[4 of 4 positions shown; findings below may reference images not displayed]

FINDINGS: No new fractures are identified.  The radial head and
olecranon appear grossly intact.  There is chronic deformity
involving the distal humerus, reflecting remote injury.  No
significant elbow joint effusion is identified.

No significant soft tissue abnormalities are characterized on
radiograph.
IMPRESSION: No new fractures seen at the left elbow joint.

## 2014-01-04 ENCOUNTER — Encounter: Payer: Self-pay | Admitting: Diagnostic Neuroimaging

## 2014-01-04 ENCOUNTER — Ambulatory Visit (INDEPENDENT_AMBULATORY_CARE_PROVIDER_SITE_OTHER): Payer: Medicare Other | Admitting: Diagnostic Neuroimaging

## 2014-01-04 VITALS — BP 112/74 | HR 95 | Temp 97.7°F | Ht 64.0 in | Wt 200.0 lb

## 2014-01-04 DIAGNOSIS — R413 Other amnesia: Secondary | ICD-10-CM

## 2014-01-04 NOTE — Progress Notes (Signed)
Kendra Simmons  PATIENT: Kendra Simmons DOB: [birth year]  REFERRING CLINICIAN:  HISTORY FROM: patient  REASON FOR VISIT: follow up (transfer from Dr. Erling Simmons)   HISTORICAL  CHIEF COMPLAINT:  Chief Complaint  Patient presents with  . Gait Problem    HISTORY OF PRESENT ILLNESS:   UPDATE 01/04/14: Doing well. No new issues. Memory and mood issues are stable. She is concerned about cost of namenda (> $300 per month) and she cannot afford it. Also, she tells me that her dementia diagnosis is not clear, and that she was told that her memory issues are related to mood and pain issues.   PRIOR HPI (03/20/12, Dr. Erling Simmons): 63 year old right-handed white single female with a 4-1/2 year history of falls causing left elbow fracture 12/13/06. She is being followed by Dr. Caryl Simmons and has been evaluated with a tilt table. She fell 05/23/10. She does not have syncope with falls. There is no warning. At times she feels sluggish before falling. She denies bowel or bladder incontinence. Workup in the hospital for falls has included CT scans of the brain 12/13/06 and 09/01/07 showing bifrontal atrophy. MRI study of the brain 01/12/10 shows increased hyperintensity on T2 study of the  bilateral petrous apices representing pneumatized bone. The MRI also shows focal atrophy. She has vitamin B12 deficiency with methylmalonic acid of 207 homocystine 14.7 on 01/18/10. Other studies have shown vitamin D deficiency. RPR nonreactive, B12 254, cortisol 12.2, TSH 1.93, Cholesterol 267,HDL 51, LDL 90, CBC normal, and CMP normal. She denies a family history of dementia. She has a history of depression previously followed by Dr. Reece Simmons but is now going to the  [facility] .She is sleeping much better, feels much better, and has increased appetite. She is not having hallucinations. She is not having headaches, visual loss, double vision, or swallowing problems. She is able to live by herself and  drives a car which  her sister says has been safe. The patient states "my head is clear" though I still have problems sleeping at night.08/29/2012= MMSE 25/30. Clock drawing task 4/4. Animal  fluency task 13. Ron Parker Index of independence in activities of daily living 6.  Instrumental  activities of daily living scale 7.  Neuropsychiatric inventory one for hallucinations, 5 for agitation, 6 for depression, 6 for anxiety, 5 for disinhibition,  5 for irritability, 8 for night time behaviors, and 6 for appetite change. Geriatric depression scale 7/15.  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 CBC, CMP, TSH  were normal except for slightly elevated liver function tests and  TSH 8.27 on 07/17/2011. She is doing well on Namenda. Her only complaint is lower jaw pain from a dry socket at tooth #18. She is concerned about weight gain on Abilify.  REVIEW OF SYSTEMS: Full 14 system review of systems performed and notable only for fatigue anemia memory loss depression anxiety insomnia.  ALLERGIES: No Known Allergies  HOME MEDICATIONS: Outpatient Prescriptions Prior to Visit  Medication Sig Dispense Refill  . acetaminophen-codeine (TYLENOL #3) 300-30 MG per tablet Take 1 tablet by mouth as needed.      . ALPRAZolam (XANAX) 1 MG tablet Take 1 tablet (1 mg total) by mouth 3 (three) times daily as needed for sleep or anxiety. anxiety  30 tablet  0  . amitriptyline (ELAVIL) 100 MG tablet Take 1 tablet (100 mg total) by mouth at bedtime.  30 tablet  0  . amoxicillin (  AMOXIL) 250 MG capsule Take 250 mg by mouth 2 (two) times daily.      [name] buPROPion (WELLBUTRIN SR) 100 MG 12 hr tablet Take 100 mg by mouth daily.      . Cyanocobalamin 500 MCG/0.1ML SOLN Inject 3 mLs into the muscle once a week. Wednesdays.      . metFORMIN (GLUCOPHAGE) 500 MG tablet Take 500 mg by mouth 2 (two) times daily with a meal. Currently taking 1 tablet per day.      [name] omeprazole (PRILOSEC) 20 MG capsule  Take 20 mg by mouth daily.       . pravastatin (PRAVACHOL) 20 MG tablet Take 20 mg by mouth daily.      . QUEtiapine (SEROQUEL) 100 MG tablet Take 1 tablet by mouth 2 (two) times daily.      . Vitamin D, Ergocalciferol, (DRISDOL) 50000 UNITS CAPS Take 50,000 Units by mouth every 7 (seven) days. On Wednesday      . NAMENDA 10 MG tablet TAKE 1 TABLET TWICE A DAY  60 tablet  1   No facility-administered medications prior to visit.    PAST MEDICAL HISTORY: Past Medical History  Diagnosis Date  . High cholesterol   . Depression   . Bipolar 1 disorder   . Arthritis   . Anxiety   . GERD (gastroesophageal reflux disease)     PAST SURGICAL HISTORY: Past Surgical History  Procedure Laterality Date  . Cosmetic surgery    . Cesarean section    . Laproscopy      FAMILY HISTORY: Family History  Problem Relation Age of Onset  . Sleep apnea Father   . Diabetes Mother   . Diabetes Sister   . Diabetes Maternal Uncle   . Diabetes Cousin     SOCIAL HISTORY:  History   Social History  . Marital Status: Divorced    Spouse Name: N/A    Number of Children: 3  . Years of Education: 14   Occupational History  . Retired    Social History Main Topics  . Smoking status: Never Smoker   . Smokeless tobacco: Never Used  . Alcohol Use: No  . Drug Use: No  . Sexual Activity: Not on file   Other Topics Concern  . Not on file   Social History Narrative   Patient lives at home alone.   Caffeine Use: none     PHYSICAL EXAM  Filed Vitals:   01/04/14 1010  BP: 112/74  Pulse: 95  Temp: 97.7 F (36.5 C)  TempSrc: Oral  Height: 5\' 4"  (1.626 m)  Weight: 200 lb (90.719 kg)    Not recorded    Body mass index is 34.31 kg/(m^2).  GENERAL EXAM: Patient is in no distress; well developed, nourished and groomed; neck is supple  CARDIOVASCULAR: Regular rate and rhythm, no murmurs, no carotid bruits  NEUROLOGIC: MENTAL STATUS: awake, alert, oriented to person, place and time,  recent and remote memory intact, normal attention and concentration, language fluent, comprehension intact, naming intact, fund of knowledge appropriate; MMSE 24/30. NO FRONTAL RELEASE SIGNS. CRANIAL NERVE: no papilledema on fundoscopic exam, pupils equal and reactive to light, visual fields full to confrontation, extraocular muscles intact, no nystagmus, facial sensation and strength symmetric, hearing intact, palate elevates symmetrically, uvula midline, shoulder shrug symmetric, tongue midline. MOTOR: MILD POSTURAL TREMOR IN BUE. Normal bulk and tone, full strength in the BUE, BLE SENSORY: normal and symmetric to light touch, temperature, vibration COORDINATION: finger-nose-finger, fine finger movements, heel-shin normal  REFLEXES: deep tendon reflexes TRACE and symmetric GAIT/STATION: narrow based gait   DIAGNOSTIC DATA (LABS, IMAGING, TESTING) - I reviewed patient records, labs, notes, testing and imaging myself where available.  Lab Results  Component Value Date   WBC 8.4 12/17/2012   HGB 9.3* 12/17/2012   HCT 29.4* 12/17/2012   MCV 96.7 12/17/2012   PLT 139* 12/17/2012      Component Value Date/Time   NA 137 12/17/2012 0402   K 4.0 12/17/2012 0402   CL 106 12/17/2012 0402   CO2 24 12/17/2012 0402   GLUCOSE 105* 12/17/2012 0402   BUN 14 12/17/2012 0402   CREATININE 0.76 12/17/2012 0402   CALCIUM 8.6 12/17/2012 0402   PROT 6.3 12/16/2012 0630   ALBUMIN 2.7* 12/16/2012 0630   AST 14 12/16/2012 0630   ALT 14 12/16/2012 0630   ALKPHOS 85 12/16/2012 0630   BILITOT 0.2* 12/16/2012 0630   GFRNONAA 89* 12/17/2012 0402   GFRAA >90 12/17/2012 0402   No results found for this basename: CHOL, HDL, LDLCALC, LDLDIRECT, TRIG, CHOLHDL   Lab Results  Component Value Date   HGBA1C 6.5* 12/16/2012   No results found for this basename: VITAMINB12   No results found for this basename: TSH      ASSESSMENT AND PLAN  63 y.o. year old female here with memory loss in setting of depression, insomnia, chronic  pain.  Dx: pseudo dementia of depression  PLAN: - taper namenda off (patient requests this due to high cost of medication, and unclear dementia diagnosis)  Return if symptoms worsen or fail to improve, for return to PCP.    Penni Bombard, [place] [id number], [id number] AM Certified in Neurology, Neurophysiology and Neuroimaging  [facility] [address], [place], [place] [address] [phone]

## 2014-01-14 DIAGNOSIS — F070 Personality change due to known physiological condition: Secondary | ICD-10-CM | POA: Diagnosis not present

## 2014-01-14 DIAGNOSIS — Z5181 Encounter for therapeutic drug level monitoring: Secondary | ICD-10-CM | POA: Diagnosis not present

## 2014-01-14 DIAGNOSIS — M67919 Unspecified disorder of synovium and tendon, unspecified shoulder: Secondary | ICD-10-CM | POA: Diagnosis not present

## 2014-01-14 DIAGNOSIS — D518 Other vitamin B12 deficiency anemias: Secondary | ICD-10-CM | POA: Diagnosis not present

## 2014-01-14 DIAGNOSIS — R5381 Other malaise: Secondary | ICD-10-CM | POA: Diagnosis not present

## 2014-01-14 DIAGNOSIS — R5383 Other fatigue: Secondary | ICD-10-CM | POA: Diagnosis not present

## 2014-01-14 DIAGNOSIS — Z79899 Other long term (current) drug therapy: Secondary | ICD-10-CM | POA: Diagnosis not present

## 2014-01-14 DIAGNOSIS — M719 Bursopathy, unspecified: Secondary | ICD-10-CM | POA: Diagnosis not present

## 2014-01-19 DIAGNOSIS — M67919 Unspecified disorder of synovium and tendon, unspecified shoulder: Secondary | ICD-10-CM | POA: Diagnosis not present

## 2014-01-19 DIAGNOSIS — D518 Other vitamin B12 deficiency anemias: Secondary | ICD-10-CM | POA: Diagnosis not present

## 2014-01-19 DIAGNOSIS — F070 Personality change due to known physiological condition: Secondary | ICD-10-CM | POA: Diagnosis not present

## 2014-01-19 DIAGNOSIS — R5381 Other malaise: Secondary | ICD-10-CM | POA: Diagnosis not present

## 2014-01-19 DIAGNOSIS — R5383 Other fatigue: Secondary | ICD-10-CM | POA: Diagnosis not present

## 2014-02-06 ENCOUNTER — Encounter (HOSPITAL_COMMUNITY): Payer: Self-pay | Admitting: Emergency Medicine

## 2014-02-06 ENCOUNTER — Emergency Department (HOSPITAL_COMMUNITY)
Admission: EM | Admit: 2014-02-06 | Discharge: 2014-02-06 | Disposition: A | Discharge status: Home / Self Care | Payer: Medicare Other | Attending: Emergency Medicine | Admitting: Emergency Medicine

## 2014-02-06 ENCOUNTER — Emergency Department (HOSPITAL_COMMUNITY): Payer: Medicare Other

## 2014-02-06 DIAGNOSIS — S161XXA Strain of muscle, fascia and tendon at neck level, initial encounter: Secondary | ICD-10-CM

## 2014-02-06 DIAGNOSIS — M25559 Pain in unspecified hip: Secondary | ICD-10-CM | POA: Diagnosis not present

## 2014-02-06 DIAGNOSIS — F411 Generalized anxiety disorder: Secondary | ICD-10-CM | POA: Insufficient documentation

## 2014-02-06 DIAGNOSIS — T148XXA Other injury of unspecified body region, initial encounter: Secondary | ICD-10-CM | POA: Diagnosis not present

## 2014-02-06 DIAGNOSIS — M129 Arthropathy, unspecified: Secondary | ICD-10-CM | POA: Diagnosis not present

## 2014-02-06 DIAGNOSIS — Z79899 Other long term (current) drug therapy: Secondary | ICD-10-CM | POA: Diagnosis not present

## 2014-02-06 DIAGNOSIS — M545 Low back pain, unspecified: Secondary | ICD-10-CM | POA: Diagnosis not present

## 2014-02-06 DIAGNOSIS — Y939 Activity, unspecified: Secondary | ICD-10-CM | POA: Insufficient documentation

## 2014-02-06 DIAGNOSIS — K219 Gastro-esophageal reflux disease without esophagitis: Secondary | ICD-10-CM | POA: Diagnosis not present

## 2014-02-06 DIAGNOSIS — M542 Cervicalgia: Secondary | ICD-10-CM | POA: Diagnosis not present

## 2014-02-06 DIAGNOSIS — E7800 Pure hypercholesterolemia, unspecified: Secondary | ICD-10-CM | POA: Diagnosis not present

## 2014-02-06 DIAGNOSIS — S139XXA Sprain of joints and ligaments of unspecified parts of neck, initial encounter: Secondary | ICD-10-CM | POA: Diagnosis not present

## 2014-02-06 DIAGNOSIS — S5000XA Contusion of unspecified elbow, initial encounter: Secondary | ICD-10-CM | POA: Diagnosis not present

## 2014-02-06 DIAGNOSIS — Y9289 Other specified places as the place of occurrence of the external cause: Secondary | ICD-10-CM | POA: Insufficient documentation

## 2014-02-06 DIAGNOSIS — S0990XA Unspecified injury of head, initial encounter: Secondary | ICD-10-CM | POA: Diagnosis not present

## 2014-02-06 DIAGNOSIS — R51 Headache: Secondary | ICD-10-CM | POA: Diagnosis not present

## 2014-02-06 DIAGNOSIS — IMO0002 Reserved for concepts with insufficient information to code with codable children: Secondary | ICD-10-CM | POA: Diagnosis not present

## 2014-02-06 DIAGNOSIS — S43402A Unspecified sprain of left shoulder joint, initial encounter: Secondary | ICD-10-CM

## 2014-02-06 DIAGNOSIS — W19XXXA Unspecified fall, initial encounter: Secondary | ICD-10-CM

## 2014-02-06 DIAGNOSIS — S8002XA Contusion of left knee, initial encounter: Secondary | ICD-10-CM

## 2014-02-06 DIAGNOSIS — F319 Bipolar disorder, unspecified: Secondary | ICD-10-CM | POA: Diagnosis not present

## 2014-02-06 DIAGNOSIS — S199XXA Unspecified injury of neck, initial encounter: Secondary | ICD-10-CM | POA: Diagnosis not present

## 2014-02-06 DIAGNOSIS — S8000XA Contusion of unspecified knee, initial encounter: Secondary | ICD-10-CM | POA: Insufficient documentation

## 2014-02-06 DIAGNOSIS — S99919A Unspecified injury of unspecified ankle, initial encounter: Secondary | ICD-10-CM | POA: Diagnosis not present

## 2014-02-06 DIAGNOSIS — S8990XA Unspecified injury of unspecified lower leg, initial encounter: Secondary | ICD-10-CM | POA: Diagnosis not present

## 2014-02-06 DIAGNOSIS — S0993XA Unspecified injury of face, initial encounter: Secondary | ICD-10-CM | POA: Diagnosis not present

## 2014-02-06 DIAGNOSIS — W1809XA Striking against other object with subsequent fall, initial encounter: Secondary | ICD-10-CM | POA: Insufficient documentation

## 2014-02-06 MED ORDER — OXYCODONE-ACETAMINOPHEN 5-325 MG PO TABS
2.0000 | ORAL_TABLET | Freq: Once | ORAL | Status: AC
  Administered 2014-02-06: 2 via ORAL
  Filled 2014-02-06: qty 2

## 2014-02-06 MED ORDER — OXYCODONE-ACETAMINOPHEN 5-325 MG PO TABS: 1.0000 | ORAL_TABLET | ORAL | Status: DC | PRN

## 2014-02-06 NOTE — Discharge Instructions (Signed)

## 2014-02-06 NOTE — ED Provider Notes (Signed)
CSN: [id number]     Arrival date & time 02/06/14  95 History   First MD Initiated Contact with Patient 02/06/14 1630     Chief Complaint  Patient presents with  . Fall     (Consider location/radiation/quality/duration/timing/severity/associated sxs/prior Treatment) HPI 63 year old female had a mechanical fall at [facility]. She states that she slipped on the 4 and bowel striking her left knee and also striking the back of her head. She denies loss of consciousness. She did not get up was transported by EMS. She denies any neck pain, numbness, tingling, or weakness. She complains of some pain in her left shoulder and left knee. Denies any loss of consciousness before or after the fall. Past Medical History  Diagnosis Date  . High cholesterol   . Depression   . Bipolar 1 disorder   . Arthritis   . Anxiety   . GERD (gastroesophageal reflux disease)    Past Surgical History  Procedure Laterality Date  . Cosmetic surgery    . Cesarean section    . Laproscopy     Family History  Problem Relation Age of Onset  . Sleep apnea Father   . Diabetes Mother   . Diabetes Sister   . Diabetes Maternal Uncle   . Diabetes Cousin    History  Substance Use Topics  . Smoking status: Never Smoker   . Smokeless tobacco: Never Used  . Alcohol Use: No   OB History   Grav Para Term Preterm Abortions TAB SAB Ect Mult Living                 Review of Systems  All other systems reviewed and are negative.      Allergies  Review of patient's allergies indicates no known allergies.  Home Medications   Current Outpatient Rx  Name  Route  Sig  Dispense  Refill  . ALPRAZolam (XANAX) 1 MG tablet   Oral   Take 1 tablet (1 mg total) by mouth 3 (three) times daily as needed for sleep or anxiety. anxiety   30 tablet   0   . amitriptyline (ELAVIL) 50 MG tablet   Oral   Take 100 mg by mouth at bedtime.         . ARIPiprazole (ABILIFY) 10 MG tablet   Oral   Take 10 mg by mouth daily.          [name] buPROPion (WELLBUTRIN SR) 100 MG 12 hr tablet   Oral   Take 100 mg by mouth daily.         . memantine (NAMENDA) 10 MG tablet   Oral   Take 10 mg by mouth every other day.          [name] omeprazole (PRILOSEC) 20 MG capsule   Oral   Take 20 mg by mouth daily.          . pravastatin (PRAVACHOL) 20 MG tablet   Oral   Take 20 mg by mouth at bedtime.          [name] QUEtiapine (SEROQUEL) 100 MG tablet   Oral   Take 1 tablet by mouth 2 (two) times daily.         . Vitamin D, Ergocalciferol, (DRISDOL) 50000 UNITS CAPS   Oral   Take 50,000 Units by mouth every 7 (seven) days. On Wednesday          BP 128/68  Pulse 78  Temp(Src) 97.5 F (36.4 C) (Oral)  Resp 14  SpO2  100% Physical Exam  Nursing note and vitals reviewed. Constitutional: She is oriented to person, place, and time. She appears well-developed and well-nourished.  HENT:  Head: Normocephalic and atraumatic.  Nose: Nose normal.  Mouth/Throat: Oropharynx is clear and moist.  Eyes: Conjunctivae and EOM are normal. Pupils are equal, round, and reactive to light.  Neck: Normal range of motion. Neck supple.  Cardiovascular: Normal rate, regular rhythm, normal heart sounds and intact distal pulses.   Pulmonary/Chest: Effort normal and breath sounds normal.  Abdominal: Soft. Bowel sounds are normal.  Musculoskeletal: Normal range of motion. She exhibits tenderness.  Contusion left anterior knee with some mild diffuse tenderness but full range of motion. Mild diffuse bilateral hip tenderness but full active range of motion. Tenderness palpated over the thoracic or lumbar spine but some mild diffuse tenderness palpated over the right.  Neurological: She is alert and oriented to person, place, and time. She displays normal reflexes. No cranial nerve deficit. She exhibits normal muscle tone. Coordination normal.    ED Course  Procedures (including critical care time) Labs Review Labs Reviewed - No data to  display Imaging Review Dg Pelvis 1-2 Views  02/06/2014   CLINICAL DATA:  [place] earlier today.  Pelvic pain.  EXAM: PELVIS - 1-2 VIEW  COMPARISON:  None.  FINDINGS: No evidence of acute fracture. Hip joints intact with symmetric well preserved joint spaces. Sacroiliac joints and symphysis pubis intact without evidence of diastasis or significant degenerative change. Visualized lower lumbar spine intact. Well preserved bone mineral density. No intrinsic osseous abnormality.  IMPRESSION: Normal examination.   Electronically Signed   By: Evangeline Dakin M.D.   On: 02/06/2014 18:14   Ct Head Wo Contrast  02/06/2014   CLINICAL DATA:  Fall, headache, posterior neck pain  EXAM: CT HEAD WITHOUT CONTRAST  CT CERVICAL SPINE WITHOUT CONTRAST  TECHNIQUE: Multidetector CT imaging of the head and cervical spine was performed following the standard protocol without intravenous contrast. Multiplanar CT image reconstructions of the cervical spine were also generated.  COMPARISON:  CT head 12/16/2012  FINDINGS: CT HEAD FINDINGS  Generalized atrophy.  Normal ventricular morphology.  No midline shift or mass effect.  Otherwise normal appearance of brain parenchyma, unchanged.  No intracranial hemorrhage, mass lesion or evidence acute infarction.  No extra-axial fluid collections.  Bones demineralized.  Coastal thickening frontal sinus.  CT CERVICAL SPINE FINDINGS  Beam hardening artifacts secondary to the shoulders.  Prevertebral soft tissues normal thickness.  Disc space narrowing with minimal endplate spur formation C4-C5 through C6-C7.  Mild scattered facet degenerative changes bilaterally.  Vertebral body heights maintained without fracture or subluxation.  No bone destruction.  Lung apices clear.  Visualized skullbase intact.  IMPRESSION: Generalized atrophy.  No acute intracranial abnormalities.  Mild scattered degenerative disc and facet disease changes of the cervical spine.  No acute cervical spine abnormalities.    Electronically Signed   By: Lavonia Dana M.D.   On: 02/06/2014 18:27   Ct Cervical Spine Wo Contrast  02/06/2014   CLINICAL DATA:  Fall, headache, posterior neck pain  EXAM: CT HEAD WITHOUT CONTRAST  CT CERVICAL SPINE WITHOUT CONTRAST  TECHNIQUE: Multidetector CT imaging of the head and cervical spine was performed following the standard protocol without intravenous contrast. Multiplanar CT image reconstructions of the cervical spine were also generated.  COMPARISON:  CT head 12/16/2012  FINDINGS: CT HEAD FINDINGS  Generalized atrophy.  Normal ventricular morphology.  No midline shift or mass effect.  Otherwise normal appearance of brain  parenchyma, unchanged.  No intracranial hemorrhage, mass lesion or evidence acute infarction.  No extra-axial fluid collections.  Bones demineralized.  Coastal thickening frontal sinus.  CT CERVICAL SPINE FINDINGS  Beam hardening artifacts secondary to the shoulders.  Prevertebral soft tissues normal thickness.  Disc space narrowing with minimal endplate spur formation C4-C5 through C6-C7.  Mild scattered facet degenerative changes bilaterally.  Vertebral body heights maintained without fracture or subluxation.  No bone destruction.  Lung apices clear.  Visualized skullbase intact.  IMPRESSION: Generalized atrophy.  No acute intracranial abnormalities.  Mild scattered degenerative disc and facet disease changes of the cervical spine.  No acute cervical spine abnormalities.   Electronically Signed   By: Lavonia Dana M.D.   On: 02/06/2014 18:27   Dg Knee Complete 4 Views Left  02/06/2014   CLINICAL DATA:  [place] and injured left knee.  EXAM: LEFT KNEE - COMPLETE 4+ VIEW  COMPARISON:  Left knee MRI 02/06/2013.  FINDINGS: No evidence of acute fracture or dislocation. Bipartite patella. Mild to moderate joint space narrowing in the medial and patellofemoral compartments with associated spurring along the undersurface of the patella. Mild osseous demineralization. No evidence of a  significant joint effusion.  IMPRESSION: No acute osseous abnormality. Mild degenerative changes as described.   Electronically Signed   By: Evangeline Dakin M.D.   On: 02/06/2014 18:10   Dg Humerus Left  02/06/2014   CLINICAL DATA:  Fall today, elbow bruising  EXAM: LEFT HUMERUS - 2+ VIEW  COMPARISON:  04/01/2012 level radiographs  FINDINGS: Osseous demineralization.  Minimal degenerative changes at [facility] joint.  Deformity of distal left humerus post healed fracture, unchanged.  No acute fracture, dislocation or bone destruction.  IMPRESSION: Old healed fracture distal left humerus.  Osseous demineralization.  No acute abnormalities.   Electronically Signed   By: Lavonia Dana M.D.   On: 02/06/2014 18:12    EKG Interpretation   None       MDM   Final diagnoses:  None    Patient's record mood. She was given Percocet for pain. She is discharged home in good condition.    Shaune Pollack, MD 02/06/14 [phone]

## 2014-02-06 NOTE — ED Notes (Signed)
Bed: CL27 Expected date: 02/06/14 Expected time: 4:24 PM Means of arrival:  Comments: fall

## 2014-02-06 NOTE — ED Notes (Signed)
Pt at [facility] c/o of fall. No LOC denies dizziness. L knee, cheek, back, elbow pain.

## 2014-02-08 ENCOUNTER — Other Ambulatory Visit: Payer: Self-pay | Admitting: Neurology

## 2014-02-08 DIAGNOSIS — R55 Syncope and collapse: Secondary | ICD-10-CM | POA: Diagnosis not present

## 2014-02-08 DIAGNOSIS — E119 Type 2 diabetes mellitus without complications: Secondary | ICD-10-CM | POA: Diagnosis not present

## 2014-02-08 DIAGNOSIS — E782 Mixed hyperlipidemia: Secondary | ICD-10-CM | POA: Diagnosis not present

## 2014-02-08 NOTE — Telephone Encounter (Signed)
OV from Jan says: PLAN:  - taper namenda off (patient requests this due to high cost of medication, and unclear dementia diagnosis)

## 2014-02-10 DIAGNOSIS — M545 Low back pain, unspecified: Secondary | ICD-10-CM | POA: Diagnosis not present

## 2014-02-10 DIAGNOSIS — Z79899 Other long term (current) drug therapy: Secondary | ICD-10-CM | POA: Diagnosis not present

## 2014-02-10 DIAGNOSIS — F060 Psychotic disorder with hallucinations due to known physiological condition: Secondary | ICD-10-CM | POA: Diagnosis not present

## 2014-02-18 DIAGNOSIS — E782 Mixed hyperlipidemia: Secondary | ICD-10-CM | POA: Diagnosis not present

## 2014-02-18 DIAGNOSIS — R5383 Other fatigue: Secondary | ICD-10-CM | POA: Diagnosis not present

## 2014-02-18 DIAGNOSIS — R5381 Other malaise: Secondary | ICD-10-CM | POA: Diagnosis not present

## 2014-02-18 DIAGNOSIS — F411 Generalized anxiety disorder: Secondary | ICD-10-CM | POA: Diagnosis not present

## 2014-02-18 DIAGNOSIS — IMO0002 Reserved for concepts with insufficient information to code with codable children: Secondary | ICD-10-CM | POA: Diagnosis not present

## 2014-02-22 ENCOUNTER — Telehealth: Payer: Self-pay | Admitting: *Deleted

## 2014-02-25 DIAGNOSIS — L97409 Non-pressure chronic ulcer of unspecified heel and midfoot with unspecified severity: Secondary | ICD-10-CM | POA: Diagnosis not present

## 2014-03-01 NOTE — Telephone Encounter (Signed)
I spoke to patient and she had started to wean off the Namenda to one 10mg  tab a day, but started to have hallucinations again.  So she has now gone back to [place] 10mg  2 times a day.  She is about to run out and wanted to know if she needed to be seen or if the doctor would refill the medication.

## 2014-03-02 NOTE — Telephone Encounter (Signed)
I spoke with patient. I recommend she stop namenda. If hallucinations return, then needs eval and treatment with PCP or psychiatry. Already on abilify and seroquel. Patient agrees with plan.  Penni Bombard, MD 9/38/1017, 5:10 PM Certified in Neurology, Neurophysiology and Neuroimaging  [facility] [address], [place], [place] [address] [phone]

## 2014-03-11 DIAGNOSIS — Z79899 Other long term (current) drug therapy: Secondary | ICD-10-CM | POA: Diagnosis not present

## 2014-03-11 DIAGNOSIS — I10 Essential (primary) hypertension: Secondary | ICD-10-CM | POA: Diagnosis not present

## 2014-03-11 DIAGNOSIS — E559 Vitamin D deficiency, unspecified: Secondary | ICD-10-CM | POA: Diagnosis not present

## 2014-03-11 DIAGNOSIS — D518 Other vitamin B12 deficiency anemias: Secondary | ICD-10-CM | POA: Diagnosis not present

## 2014-03-11 DIAGNOSIS — R5383 Other fatigue: Secondary | ICD-10-CM | POA: Diagnosis not present

## 2014-03-11 DIAGNOSIS — F070 Personality change due to known physiological condition: Secondary | ICD-10-CM | POA: Diagnosis not present

## 2014-03-11 DIAGNOSIS — M5137 Other intervertebral disc degeneration, lumbosacral region: Secondary | ICD-10-CM | POA: Diagnosis not present

## 2014-03-11 DIAGNOSIS — L97409 Non-pressure chronic ulcer of unspecified heel and midfoot with unspecified severity: Secondary | ICD-10-CM | POA: Diagnosis not present

## 2014-03-11 DIAGNOSIS — E119 Type 2 diabetes mellitus without complications: Secondary | ICD-10-CM | POA: Diagnosis not present

## 2014-03-11 DIAGNOSIS — E538 Deficiency of other specified B group vitamins: Secondary | ICD-10-CM | POA: Diagnosis not present

## 2014-03-11 DIAGNOSIS — R5381 Other malaise: Secondary | ICD-10-CM | POA: Diagnosis not present

## 2014-03-16 ENCOUNTER — Telehealth: Payer: Self-pay | Admitting: Diagnostic Neuroimaging

## 2014-03-16 NOTE — Telephone Encounter (Signed)
Patient has been returned to PCP. She may follow up as needed. Send her last office note. I did not restrict or release her driving. She should discuss with her PCP. -VRP

## 2014-03-16 NOTE — Telephone Encounter (Signed)
Pt called.  She stated that she has been dismissed.  She has asked for a dismissal letter stating that she can drive again, that there are no signs of dementia and she does not require neurological services anymore.  She also asked if the last couple of office notes could be included with the letter. Please call to discuss as necessary.  Thank you.

## 2014-03-16 NOTE — Telephone Encounter (Signed)
Pt calling stating that she has been dismissed and would like a dismissal letter stating that she can drive again and that there are no signs of dementia and that she does not require neurological services anymore. Please advise

## 2014-03-17 NOTE — Telephone Encounter (Signed)
Called pt to inform her per Dr. Leta Baptist, that the pt has been returned to PCP. Pt may f/u as needed and can get last office note. Dr. Leta Baptist stated that he did not restrict or release her driving and that she should discuss with her PCP. Pt stated that she did not need the office note at this time. I advised the pt that if she has any other problems, questions or concerns to call the office. Pt verbalized understanding.

## 2014-03-19 NOTE — Telephone Encounter (Signed)
Closing encounter

## 2014-03-25 DIAGNOSIS — L97409 Non-pressure chronic ulcer of unspecified heel and midfoot with unspecified severity: Secondary | ICD-10-CM | POA: Diagnosis not present

## 2014-03-29 DIAGNOSIS — R300 Dysuria: Secondary | ICD-10-CM | POA: Diagnosis not present

## 2014-03-29 DIAGNOSIS — E785 Hyperlipidemia, unspecified: Secondary | ICD-10-CM | POA: Diagnosis not present

## 2014-03-29 DIAGNOSIS — K219 Gastro-esophageal reflux disease without esophagitis: Secondary | ICD-10-CM | POA: Diagnosis not present

## 2014-03-29 DIAGNOSIS — M79609 Pain in unspecified limb: Secondary | ICD-10-CM | POA: Diagnosis not present

## 2014-03-29 DIAGNOSIS — F411 Generalized anxiety disorder: Secondary | ICD-10-CM | POA: Diagnosis not present

## 2014-03-29 DIAGNOSIS — F329 Major depressive disorder, single episode, unspecified: Secondary | ICD-10-CM | POA: Diagnosis not present

## 2014-04-15 DIAGNOSIS — L97409 Non-pressure chronic ulcer of unspecified heel and midfoot with unspecified severity: Secondary | ICD-10-CM | POA: Diagnosis not present

## 2014-04-21 DIAGNOSIS — F060 Psychotic disorder with hallucinations due to known physiological condition: Secondary | ICD-10-CM | POA: Diagnosis not present

## 2014-04-21 DIAGNOSIS — E782 Mixed hyperlipidemia: Secondary | ICD-10-CM | POA: Diagnosis not present

## 2014-04-21 DIAGNOSIS — F411 Generalized anxiety disorder: Secondary | ICD-10-CM | POA: Diagnosis not present

## 2014-04-28 DIAGNOSIS — H40039 Anatomical narrow angle, unspecified eye: Secondary | ICD-10-CM | POA: Diagnosis not present

## 2014-04-28 DIAGNOSIS — H2510 Age-related nuclear cataract, unspecified eye: Secondary | ICD-10-CM | POA: Diagnosis not present

## 2014-05-13 DIAGNOSIS — F332 Major depressive disorder, recurrent severe without psychotic features: Secondary | ICD-10-CM | POA: Diagnosis not present

## 2014-06-03 DIAGNOSIS — N3000 Acute cystitis without hematuria: Secondary | ICD-10-CM | POA: Diagnosis not present

## 2014-06-04 DIAGNOSIS — M25579 Pain in unspecified ankle and joints of unspecified foot: Secondary | ICD-10-CM | POA: Diagnosis not present

## 2014-06-04 DIAGNOSIS — M719 Bursopathy, unspecified: Secondary | ICD-10-CM | POA: Diagnosis not present

## 2014-06-04 DIAGNOSIS — M67919 Unspecified disorder of synovium and tendon, unspecified shoulder: Secondary | ICD-10-CM | POA: Diagnosis not present

## 2014-06-19 DIAGNOSIS — N390 Urinary tract infection, site not specified: Secondary | ICD-10-CM | POA: Diagnosis not present

## 2014-06-24 DIAGNOSIS — N390 Urinary tract infection, site not specified: Secondary | ICD-10-CM | POA: Diagnosis not present

## 2014-06-24 DIAGNOSIS — R350 Frequency of micturition: Secondary | ICD-10-CM | POA: Diagnosis not present

## 2014-06-25 DIAGNOSIS — R350 Frequency of micturition: Secondary | ICD-10-CM | POA: Diagnosis not present

## 2014-06-29 DIAGNOSIS — R3915 Urgency of urination: Secondary | ICD-10-CM | POA: Diagnosis not present

## 2014-06-29 DIAGNOSIS — R339 Retention of urine, unspecified: Secondary | ICD-10-CM | POA: Diagnosis not present

## 2014-07-01 DIAGNOSIS — F332 Major depressive disorder, recurrent severe without psychotic features: Secondary | ICD-10-CM | POA: Diagnosis not present

## 2014-07-10 ENCOUNTER — Ambulatory Visit (HOSPITAL_COMMUNITY)
Admission: RE | Admit: 2014-07-10 | Discharge: 2014-07-10 | Disposition: A | Discharge status: Home / Self Care | Payer: Medicare Other | Source: Home / Self Care | Attending: Psychiatry | Admitting: Psychiatry

## 2014-07-10 ENCOUNTER — Encounter (HOSPITAL_COMMUNITY): Payer: Self-pay | Admitting: Emergency Medicine

## 2014-07-10 ENCOUNTER — Emergency Department (EMERGENCY_DEPARTMENT_HOSPITAL)
Admission: EM | Admit: 2014-07-10 | Discharge: 2014-07-11 | Disposition: A | Discharge status: Other Acute Inpatient Hospital | Payer: Medicare Other | Source: Home / Self Care | Attending: Emergency Medicine | Admitting: Emergency Medicine

## 2014-07-10 DIAGNOSIS — E7800 Pure hypercholesterolemia, unspecified: Secondary | ICD-10-CM

## 2014-07-10 DIAGNOSIS — K219 Gastro-esophageal reflux disease without esophagitis: Secondary | ICD-10-CM | POA: Insufficient documentation

## 2014-07-10 DIAGNOSIS — F322 Major depressive disorder, single episode, severe without psychotic features: Principal | ICD-10-CM | POA: Diagnosis present

## 2014-07-10 DIAGNOSIS — F319 Bipolar disorder, unspecified: Secondary | ICD-10-CM | POA: Diagnosis not present

## 2014-07-10 DIAGNOSIS — F411 Generalized anxiety disorder: Secondary | ICD-10-CM | POA: Diagnosis present

## 2014-07-10 DIAGNOSIS — Z8739 Personal history of other diseases of the musculoskeletal system and connective tissue: Secondary | ICD-10-CM

## 2014-07-10 DIAGNOSIS — F1320 Sedative, hypnotic or anxiolytic dependence, uncomplicated: Secondary | ICD-10-CM | POA: Insufficient documentation

## 2014-07-10 DIAGNOSIS — F410 Panic disorder [episodic paroxysmal anxiety] without agoraphobia: Secondary | ICD-10-CM | POA: Diagnosis present

## 2014-07-10 DIAGNOSIS — M129 Arthropathy, unspecified: Secondary | ICD-10-CM | POA: Diagnosis present

## 2014-07-10 DIAGNOSIS — R45851 Suicidal ideations: Secondary | ICD-10-CM | POA: Diagnosis not present

## 2014-07-10 DIAGNOSIS — F4323 Adjustment disorder with mixed anxiety and depressed mood: Secondary | ICD-10-CM | POA: Diagnosis not present

## 2014-07-10 DIAGNOSIS — Z79899 Other long term (current) drug therapy: Secondary | ICD-10-CM

## 2014-07-10 DIAGNOSIS — G4700 Insomnia, unspecified: Secondary | ICD-10-CM | POA: Diagnosis present

## 2014-07-10 DIAGNOSIS — F329 Major depressive disorder, single episode, unspecified: Secondary | ICD-10-CM

## 2014-07-10 DIAGNOSIS — F3289 Other specified depressive episodes: Secondary | ICD-10-CM | POA: Insufficient documentation

## 2014-07-10 DIAGNOSIS — F339 Major depressive disorder, recurrent, unspecified: Secondary | ICD-10-CM

## 2014-07-10 DIAGNOSIS — F332 Major depressive disorder, recurrent severe without psychotic features: Secondary | ICD-10-CM | POA: Diagnosis not present

## 2014-07-10 LAB — URINALYSIS, ROUTINE W REFLEX MICROSCOPIC
BILIRUBIN URINE: NEGATIVE
Glucose, UA: NEGATIVE mg/dL
Hgb urine dipstick: NEGATIVE
Ketones, ur: NEGATIVE mg/dL
Nitrite: NEGATIVE
PROTEIN: NEGATIVE mg/dL
Specific Gravity, Urine: 1.021 (ref 1.005–1.030)
UROBILINOGEN UA: 0.2 mg/dL (ref 0.0–1.0)
pH: 6 (ref 5.0–8.0)

## 2014-07-10 LAB — COMPREHENSIVE METABOLIC PANEL
ALT: 10 U/L (ref 0–35)
ANION GAP: 12 (ref 5–15)
AST: 21 U/L (ref 0–37)
Albumin: 4.2 g/dL (ref 3.5–5.2)
Alkaline Phosphatase: 65 U/L (ref 39–117)
BUN: 13 mg/dL (ref 6–23)
CALCIUM: 10 mg/dL (ref 8.4–10.5)
CO2: 26 mEq/L (ref 19–32)
Chloride: 102 mEq/L (ref 96–112)
Creatinine, Ser: 0.8 mg/dL (ref 0.50–1.10)
GFR calc Af Amer: 89 mL/min — ABNORMAL LOW (ref >90)
GFR calc non Af Amer: 77 mL/min — ABNORMAL LOW (ref >90)
GLUCOSE: 145 mg/dL — AB (ref 70–99)
Potassium: 3.8 mEq/L (ref 3.7–5.3)
SODIUM: 140 meq/L (ref 137–147)
Total Bilirubin: 0.4 mg/dL (ref 0.3–1.2)
Total Protein: 7.3 g/dL (ref 6.0–8.3)

## 2014-07-10 LAB — CBC
HCT: 38.3 % (ref 36.0–46.0)
HEMOGLOBIN: 12.8 g/dL (ref 12.0–15.0)
MCH: 30.1 pg (ref 26.0–34.0)
MCHC: 33.4 g/dL (ref 30.0–36.0)
MCV: 90.1 fL (ref 78.0–100.0)
Platelets: 174 10*3/uL (ref 150–400)
RBC: 4.25 MIL/uL (ref 3.87–5.11)
RDW: 13.2 % (ref 11.5–15.5)
WBC: 5.9 10*3/uL (ref 4.0–10.5)

## 2014-07-10 LAB — RAPID URINE DRUG SCREEN, HOSP PERFORMED
Amphetamines: NOT DETECTED
BARBITURATES: NOT DETECTED
BENZODIAZEPINES: POSITIVE — AB
Cocaine: NOT DETECTED
Opiates: NOT DETECTED
Tetrahydrocannabinol: NOT DETECTED

## 2014-07-10 LAB — URINE MICROSCOPIC-ADD ON

## 2014-07-10 LAB — SALICYLATE LEVEL: Salicylate Lvl: <2 mg/dL — ABNORMAL LOW (ref 2.8–20.0)

## 2014-07-10 LAB — ETHANOL: Alcohol, Ethyl (B): <11 mg/dL (ref 0–11)

## 2014-07-10 LAB — ACETAMINOPHEN LEVEL

## 2014-07-10 MED ORDER — PANTOPRAZOLE SODIUM 40 MG PO TBEC
40.0000 mg | DELAYED_RELEASE_TABLET | Freq: Every day | ORAL | Status: DC
  Administered 2014-07-10 – 2014-07-11 (×2): 40 mg via ORAL
  Filled 2014-07-10 (×2): qty 1

## 2014-07-10 MED ORDER — CEPHALEXIN 500 MG PO CAPS
500.0000 mg | ORAL_CAPSULE | Freq: Three times a day (TID) | ORAL | Status: DC
  Administered 2014-07-10 – 2014-07-11 (×4): 500 mg via ORAL
  Filled 2014-07-10 (×4): qty 1

## 2014-07-10 MED ORDER — TAMSULOSIN HCL 0.4 MG PO CAPS
0.4000 mg | ORAL_CAPSULE | Freq: Every day | ORAL | Status: DC
  Administered 2014-07-10 – 2014-07-11 (×2): 0.4 mg via ORAL
  Filled 2014-07-10 (×2): qty 1

## 2014-07-10 MED ORDER — ALPRAZOLAM 1 MG PO TABS
1.0000 mg | ORAL_TABLET | Freq: Three times a day (TID) | ORAL | Status: DC | PRN
  Administered 2014-07-10 – 2014-07-11 (×2): 1 mg via ORAL
  Filled 2014-07-10 (×3): qty 1

## 2014-07-10 MED ORDER — VITAMIN D (ERGOCALCIFEROL) 1.25 MG (50000 UNIT) PO CAPS: 50000.0000 [IU] | ORAL_CAPSULE | ORAL | Status: DC

## 2014-07-10 MED ORDER — SERTRALINE HCL 50 MG PO TABS: 50.0000 mg | ORAL_TABLET | Freq: Every day | ORAL | Status: DC

## 2014-07-10 MED ORDER — DOXEPIN HCL 25 MG PO CAPS
25.0000 mg | ORAL_CAPSULE | Freq: Every evening | ORAL | Status: DC | PRN
  Filled 2014-07-10: qty 1

## 2014-07-10 MED ORDER — QUETIAPINE FUMARATE 100 MG PO TABS
100.0000 mg | ORAL_TABLET | Freq: Two times a day (BID) | ORAL | Status: DC
  Administered 2014-07-10 – 2014-07-11 (×3): 100 mg via ORAL
  Filled 2014-07-10 (×3): qty 1

## 2014-07-10 MED ORDER — SERTRALINE HCL 50 MG PO TABS
100.0000 mg | ORAL_TABLET | Freq: Every day | ORAL | Status: DC
  Administered 2014-07-11: 100 mg via ORAL
  Filled 2014-07-10: qty 2

## 2014-07-10 MED ORDER — SIMVASTATIN 10 MG PO TABS
10.0000 mg | ORAL_TABLET | Freq: Every day | ORAL | Status: DC
  Administered 2014-07-10: 10 mg via ORAL
  Filled 2014-07-10 (×3): qty 1

## 2014-07-10 NOTE — ED Notes (Addendum)
Pt calmer, watching tv drinking juice and eat crackers, declined xanax and verified that she is taking flomax for urinary urgency and is seeing a urologist for 2 recent bladder infections.

## 2014-07-10 NOTE — BH Assessment (Addendum)
Assessment Note  Kendra Simmons is an 63 y.o. female that presented as a walk-in at [facility] reporting SI and increasing depression.  Pt stated she doesn't want to live anymore and kept repeating, "Help me!  Help me!  [place]."  Pt tearful throughout assessment and reported she felt weak because she hadn't been eating or drinking over the past week due other than "water and a little ice cream."  Pt stated she recently changed psychiatrists.  She was seeing [name] and then began seeing [name] at [place] 7 weeks ago.  Pt stated all of her medications were changed and she cannot recall what they all are because her sister has POA - [name] [phone].  Called pt's sister @ 73, Kendra Simmons, who reported from the best of her knowledge that she takes Xanax, Seroquel, Zoloft, a new sleep medication and cholesterol medication.  Pt denies HI or psychosis.  Pt endorses sx of depression and anxiety, including neurovegetative sx of depression such as not grooming and staying in bed.  Pt reports that she has a longstanding hx of depression with both inpatient and outpatient treatment.  Pt stated she has been to [facility] "years ago."  Pt stated that she is having conflict with her family, because they want her to help take care of her mother, who is an invalid.  Pt stated she can barely take care of herself.  She stated her sister dispenses her medications to her but they are not working.  Pt tearful throughout session, oriented x 4, had depressed mood, and appeared anxious and bizarre, despite denying having AVH.  Pt drove herself to [facility], "and I am not supposed to be driving."  Pt lives alone, but has her sister here and one of her children.  Completed assessment, consulted with [name], NP @ [phone], who recommended med clearance at [facility] and inpatient treatment.  Pt to be transported via [place], as she is voluntary.  TTS and [facility] charge nurse made aware.     Axis I: 296.33 Major Depressive  Disorder, Recurrent Episode, Severe Axis II: Deferred Axis III:  Past Medical History  Diagnosis Date  . High cholesterol   . Depression   . Bipolar 1 disorder   . Arthritis   . Anxiety   . GERD (gastroesophageal reflux disease)    Axis IV: other psychosocial or environmental problems and problems with primary support group Axis V: 21-30 behavior considerably influenced by delusions or hallucinations OR serious impairment in judgment, communication OR inability to function in almost all areas  Past Medical History:  Past Medical History  Diagnosis Date  . High cholesterol   . Depression   . Bipolar 1 disorder   . Arthritis   . Anxiety   . GERD (gastroesophageal reflux disease)     Past Surgical History  Procedure Laterality Date  . Cosmetic surgery    . Cesarean section    . Laproscopy      Family History:  Family History  Problem Relation Age of Onset  . Sleep apnea Father   . Diabetes Mother   . Diabetes Sister   . Diabetes Maternal Uncle   . Diabetes Cousin     Social History:  reports that she has never smoked. She has never used smokeless tobacco. She reports that she does not drink alcohol or use illicit drugs.  Additional Social History:  Alcohol / Drug Use Pain Medications: see med list Prescriptions: see med list Over  the Counter: see med list History of alcohol / drug use?: No history of alcohol / drug abuse Longest period of sobriety (when/how long):  (na) Negative Consequences of Use:  (na) Withdrawal Symptoms:  (na)  CIWA:   COWS:    Allergies: No Known Allergies  Home Medications:  (Not in a hospital admission)  OB/GYN Status:  No LMP recorded. Patient is postmenopausal.  General Assessment Data Location of Assessment: [facility] Is this a Tele or Face-to-Face Assessment?: Face-to-Face Is this an Initial Assessment or a Re-assessment for this encounter?: Initial Assessment Living Arrangements: Alone Can pt return to  current living arrangement?: Yes Admission Status: Voluntary Is patient capable of signing voluntary admission?: Yes Transfer from: Home Referral Source: Self/Family/Friend  Medical Screening Exam ([place]) Medical Exam completed: No Reason for MSE not completed: Other: (pt sent to [facility] for med clearance)  [place] Living Arrangements: Alone Name of Psychiatrist: Dr. [name] - [facility] Name of Therapist: none  Education Status Is patient currently in school?: No Highest grade of school patient has completed: some college  Risk to self Suicidal Ideation: Yes-Currently Present Suicidal Intent: Yes-Currently Present Is patient at risk for suicide?: Yes Suicidal Plan?: No Access to Means: No What has been your use of drugs/alcohol within the last 12 months?: na - pt denies Previous Attempts/Gestures: No How many times?: 0 Other Self Harm Risks: na - pt denies Triggers for Past Attempts: None known Intentional Self Injurious Behavior: None Family Suicide History: No Recent stressful life event(s): Conflict (Comment);Other (Comment) (SI, Depression, conflict w/ family, recent med change) Persecutory voices/beliefs?: No Depression: Yes Depression Symptoms: Despondent;Insomnia;Tearfulness;Fatigue;Loss of interest in usual pleasures;Feeling worthless/self pity;Feeling angry/irritable Substance abuse history and/or treatment for substance abuse?: No Suicide prevention information given to non-admitted patients: Not applicable  Risk to Others Homicidal Ideation: No Thoughts of Harm to Others: No Current Homicidal Intent: No Current Homicidal Plan: No Access to Homicidal Means: No Identified Victim: na - pt denies History of harm to others?: No Assessment of Violence: None Noted Violent Behavior Description: na - pt calm, cooperative Does patient have access to weapons?: No Criminal Charges Pending?: No Does patient have a court date:  No  Psychosis Hallucinations: None noted Delusions: None noted  Mental Status Report Appear/Hygiene: Disheveled Eye Contact: Good Motor Activity: Unsteady Speech: Logical/coherent;Pressured Level of Consciousness: Alert;Crying Mood: Depressed;Despair;Helpless Affect: Depressed;Sad Anxiety Level: Panic Attacks Panic attack frequency: none currently, as pt takes Xanax  Most recent panic attack: pt cannot recall Thought Processes: Coherent;Relevant Judgement: Impaired Orientation: Person;Place;Time;Situation Obsessive Compulsive Thoughts/Behaviors: None  Cognitive Functioning Concentration: Decreased Memory: Recent Impaired;Remote Impaired IQ: Average Insight: Poor Impulse Control: Fair Appetite: Poor Weight Loss:  (pt is unsure) Weight Gain: 0 Sleep: Decreased Total Hours of Sleep:  (Reports has not slept in 4 days) Vegetative Symptoms: Staying in bed;Decreased grooming  ADLScreening [facility]) Patient's cognitive ability adequate to safely complete daily activities?: Yes Patient able to express need for assistance with ADLs?: Yes Independently performs ADLs?: Yes (appropriate for developmental age)  Prior Inpatient Therapy Prior Inpatient Therapy: Yes Prior Therapy Dates: pt cannot recall dates - "years ago" Prior Therapy Facilty/Provider(s): [facility] and other unknown facilities Reason for Treatment: Depression  Prior Outpatient Therapy Prior Outpatient Therapy: Yes Prior Therapy Dates: multiple dates in past, current Prior Therapy Facilty/Provider(s): [facility] Reason for Treatment: Med mgnt  ADL Screening (condition at time of admission) Patient's cognitive ability adequate to safely complete daily activities?: Yes Is the patient deaf  or have difficulty hearing?: No Does the patient have difficulty seeing, even when wearing glasses/contacts?: No Does the patient have difficulty concentrating, remembering, or making decisions?:  Yes Patient able to express need for assistance with ADLs?: Yes Does the patient have difficulty dressing or bathing?: No Independently performs ADLs?: Yes (appropriate for developmental age) Does the patient have difficulty walking or climbing stairs?: No  Home Assistive Devices/Equipment Home Assistive Devices/Equipment: None    Abuse/Neglect Assessment (Assessment to be complete while patient is alone) Physical Abuse: Denies Verbal Abuse: Denies Sexual Abuse: Denies Exploitation of patient/patient's resources: Denies Self-Neglect: Denies Values / Beliefs Cultural Requests During Hospitalization: None Spiritual Requests During Hospitalization: None Consults Spiritual Care Consult Needed: No Social Work Consult Needed: No Regulatory affairs officer (For Healthcare) Advance Directive: Patient does not have advance directive    Additional Information 1:1 In Past 12 Months?: No CIRT Risk: No Elopement Risk: No Does patient have medical clearance?: No     Disposition:  Disposition Initial Assessment Completed for this Encounter: Yes Disposition of Patient: Other dispositions Other disposition(s): Other (Comment) (Pt sent to [facility] for med clearance)  On Site Evaluation by:   Reviewed with Physician:    Shaune Pascal, [place], [facility] Licensed Professional Counselor Triage Specialist   07/10/2014 2:01 PM

## 2014-07-10 NOTE — Progress Notes (Signed)
Pt has been declined by Dr. Konrad Penta at [facility] per [place].  States they are not accepting anymore pt's at this time.   Rick Duff Disposition MHT

## 2014-07-10 NOTE — ED Notes (Signed)
Per pt, "cant get out of this [place] I am in"-states increased depression for about a week-cant eat, sleep-no SI

## 2014-07-10 NOTE — ED Notes (Signed)
Lab in to draw

## 2014-07-10 NOTE — Progress Notes (Signed)
The following facilities have been contacted on pt's behalf for inptx:  REFERRAL FAXED [place]- per Shirlee Limerick can fax [facility]- per Maudie Mercury can fax Sharlene Motts- per [place] can fax for review [facility]- per Elwyn Lade- per [facility]- per Bethena Roys can fax for possible bed [facility]- per [facility]- per Marcie Bal at [place]- per Joycelyn Schmid no d/c's until [place] [address] [place] 55+ unit- per Gloris Ham- per [facility]- per Candis Shine- per Cornelia Copa- per [facility]- per Rolly Salter- per [facility]- per [place] child beds only [facility]- per Salina April- per [facility]- per Stanford Breed- per Drucie Ip- per Magda Paganini [facility]- per [facility]- per [facility] Disposition MHT

## 2014-07-10 NOTE — ED Notes (Signed)
Pt has been changed into scrubs. Pt and belongings have been wanded.

## 2014-07-10 NOTE — ED Provider Notes (Signed)
CSN: [id number]     Arrival date & time 07/10/14  1349 History   First MD Initiated Contact with Patient 07/10/14 1422     Chief Complaint  Patient presents with  . Depression     (Consider location/radiation/quality/duration/timing/severity/associated sxs/prior Treatment) HPI  Kendra Simmons with depression. "I'm in a valley and I get get out of it." Reports hx of depression medications recently changed and she feels as if this may be contributing. Increased stress with her family. No SI but some feelings of hopelessness. No hallucinations. Denies any drug use.  Past Medical History  Diagnosis Date  . High cholesterol   . Depression   . Bipolar 1 disorder   . Arthritis   . Anxiety   . GERD (gastroesophageal reflux disease)    Past Surgical History  Procedure Laterality Date  . Cosmetic surgery    . Cesarean section    . Laproscopy     Family History  Problem Relation Age of Onset  . Sleep apnea Father   . Diabetes Mother   . Diabetes Sister   . Diabetes Maternal Uncle   . Diabetes Cousin    History  Substance Use Topics  . Smoking status: Never Smoker   . Smokeless tobacco: Never Used  . Alcohol Use: No   OB History   Grav Para Term Preterm Abortions TAB SAB Ect Mult Living                 Review of Systems  All systems reviewed and negative, other than as noted in HPI.    Allergies  Review of patient's allergies indicates no known allergies.  Home Medications   Prior to Admission medications   Medication Sig Start Date End Date Taking? Authorizing Provider  ALPRAZolam Duanne Moron) 1 MG tablet Take 1 tablet (1 mg total) by mouth 3 (three) times daily as needed for sleep or anxiety. anxiety 12/17/12  Yes Amire Lis, MD  doxepin (SINEQUAN) 25 MG capsule Take 25 mg by mouth at bedtime as needed (sleep).   Yes Historical Provider, MD  omeprazole (PRILOSEC) 20 MG capsule Take 20 mg by mouth daily.    Yes Historical Provider, MD  pravastatin (PRAVACHOL) 20 MG tablet Take  20 mg by mouth at bedtime.    Yes Historical Provider, MD  QUEtiapine (SEROQUEL) 100 MG tablet Take 1 tablet by mouth 2 (two) times daily. 11/04/13  Yes Historical Provider, MD  sertraline (ZOLOFT) 100 MG tablet Take 50-100 mg by mouth daily. 50 mg for 7 days then increase to 100 mg daily   Yes Historical Provider, MD  tamsulosin (FLOMAX) 0.4 MG CAPS capsule Take 0.4 mg by mouth daily.   Yes Historical Provider, MD  Vitamin D, Ergocalciferol, (DRISDOL) 50000 UNITS CAPS Take 50,000 Units by mouth every 7 (seven) days. On Wednesday   Yes Historical Provider, MD   BP 153/81  Pulse 90  Temp(Src) 98.1 F (36.7 C) (Oral)  Resp 16  SpO2 97% Physical Exam  Nursing note and vitals reviewed. Constitutional: She appears well-developed and well-nourished. No distress.  HENT:  Head: Normocephalic and atraumatic.  Eyes: Conjunctivae are normal. Right eye exhibits no discharge. Left eye exhibits no discharge.  Neck: Neck supple.  Cardiovascular: Normal rate, regular rhythm and normal heart sounds.  Exam reveals no gallop and no friction rub.   No murmur heard. Pulmonary/Chest: Effort normal and breath sounds normal. No respiratory distress.  Abdominal: Soft. She exhibits no distension. There is no tenderness.  Musculoskeletal: She exhibits no  edema and no tenderness.  Neurological: She is alert.  Skin: Skin is warm and dry.  Psychiatric: Her behavior is normal. Thought content normal.  Speech is clear. Content is appropriate. Does not appear to be responding to internal stimuli.    ED Course  Procedures (including critical care time) Labs Review Labs Reviewed - No data to display  Imaging Review No results found.   EKG Interpretation None      MDM   Final diagnoses:  Major depressive disorder, recurrent, severe without psychotic features    63 year old female with depression. No active suicidal ideation. She does not appear to be psychotic. Do not feel that she meets involuntary  commitment criteria, but she may potentially benefit from evaluation for stabilization. We'll medically clear to have patient evaluated by TTS.    Virgel Manifold, MD 07/14/14 [phone]

## 2014-07-11 ENCOUNTER — Encounter (HOSPITAL_COMMUNITY): Payer: Self-pay | Admitting: Psychiatry

## 2014-07-11 ENCOUNTER — Encounter (HOSPITAL_COMMUNITY): Payer: Self-pay | Admitting: *Deleted

## 2014-07-11 ENCOUNTER — Inpatient Hospital Stay (HOSPITAL_COMMUNITY)
Admission: AD | Admit: 2014-07-11 | Discharge: 2014-07-26 | DRG: 885 | Disposition: A | Discharge status: Home / Self Care | Payer: Medicare Other | Source: Intra-hospital | Attending: Psychiatry | Admitting: Psychiatry

## 2014-07-11 DIAGNOSIS — F410 Panic disorder [episodic paroxysmal anxiety] without agoraphobia: Secondary | ICD-10-CM | POA: Diagnosis present

## 2014-07-11 DIAGNOSIS — F322 Major depressive disorder, single episode, severe without psychotic features: Secondary | ICD-10-CM | POA: Diagnosis present

## 2014-07-11 DIAGNOSIS — R45851 Suicidal ideations: Secondary | ICD-10-CM

## 2014-07-11 DIAGNOSIS — M129 Arthropathy, unspecified: Secondary | ICD-10-CM | POA: Diagnosis present

## 2014-07-11 DIAGNOSIS — F332 Major depressive disorder, recurrent severe without psychotic features: Secondary | ICD-10-CM

## 2014-07-11 DIAGNOSIS — G4700 Insomnia, unspecified: Secondary | ICD-10-CM | POA: Diagnosis present

## 2014-07-11 DIAGNOSIS — F319 Bipolar disorder, unspecified: Secondary | ICD-10-CM | POA: Diagnosis not present

## 2014-07-11 DIAGNOSIS — E7800 Pure hypercholesterolemia, unspecified: Secondary | ICD-10-CM | POA: Diagnosis not present

## 2014-07-11 DIAGNOSIS — F411 Generalized anxiety disorder: Secondary | ICD-10-CM | POA: Diagnosis not present

## 2014-07-11 DIAGNOSIS — F4323 Adjustment disorder with mixed anxiety and depressed mood: Secondary | ICD-10-CM | POA: Diagnosis not present

## 2014-07-11 DIAGNOSIS — K219 Gastro-esophageal reflux disease without esophagitis: Secondary | ICD-10-CM | POA: Diagnosis present

## 2014-07-11 MED ORDER — LOPERAMIDE HCL 2 MG PO CAPS
2.0000 mg | ORAL_CAPSULE | ORAL | Status: DC | PRN
  Administered 2014-07-11 (×3): 2 mg via ORAL
  Filled 2014-07-11 (×3): qty 1

## 2014-07-11 MED ORDER — TAMSULOSIN HCL 0.4 MG PO CAPS
0.4000 mg | ORAL_CAPSULE | Freq: Every day | ORAL | Status: DC
  Administered 2014-07-12 – 2014-07-24 (×14): 0.4 mg via ORAL
  Filled 2014-07-11 (×15): qty 1

## 2014-07-11 MED ORDER — MAGNESIUM HYDROXIDE 400 MG/5ML PO SUSP: 30.0000 mL | Freq: Every day | ORAL | Status: DC | PRN

## 2014-07-11 MED ORDER — QUETIAPINE FUMARATE 200 MG PO TABS
200.0000 mg | ORAL_TABLET | Freq: Every day | ORAL | Status: DC
  Administered 2014-07-11: 200 mg via ORAL
  Filled 2014-07-11 (×3): qty 1

## 2014-07-11 MED ORDER — ACETAMINOPHEN 325 MG PO TABS: 650.0000 mg | ORAL_TABLET | Freq: Four times a day (QID) | ORAL | Status: DC | PRN

## 2014-07-11 MED ORDER — QUETIAPINE FUMARATE 100 MG PO TABS: 200.0000 mg | ORAL_TABLET | Freq: Every day | ORAL | Status: DC

## 2014-07-11 MED ORDER — ACETAMINOPHEN 325 MG PO TABS
650.0000 mg | ORAL_TABLET | Freq: Four times a day (QID) | ORAL | Status: DC | PRN
  Administered 2014-07-11 – 2014-07-26 (×21): 650 mg via ORAL
  Filled 2014-07-11 (×22): qty 2

## 2014-07-11 MED ORDER — SERTRALINE HCL 100 MG PO TABS
100.0000 mg | ORAL_TABLET | Freq: Every day | ORAL | Status: DC
  Administered 2014-07-12: 100 mg via ORAL
  Filled 2014-07-11: qty 2
  Filled 2014-07-11 (×2): qty 1

## 2014-07-11 MED ORDER — ALPRAZOLAM 0.5 MG PO TABS
0.5000 mg | ORAL_TABLET | Freq: Three times a day (TID) | ORAL | Status: DC | PRN
  Administered 2014-07-11 – 2014-07-12 (×3): 0.5 mg via ORAL
  Filled 2014-07-11 (×3): qty 1

## 2014-07-11 MED ORDER — VITAMIN D (ERGOCALCIFEROL) 1.25 MG (50000 UNIT) PO CAPS
50000.0000 [IU] | ORAL_CAPSULE | ORAL | Status: DC
Start: 2014-07-14 — End: 2014-07-26
  Administered 2014-07-14 – 2014-07-21 (×2): 50000 [IU] via ORAL
  Filled 2014-07-11 (×3): qty 1

## 2014-07-11 MED ORDER — ACETAMINOPHEN 325 MG PO TABS: 650.0000 mg | ORAL_TABLET | Freq: Once | ORAL | Status: DC

## 2014-07-11 MED ORDER — CEPHALEXIN 500 MG PO CAPS
500.0000 mg | ORAL_CAPSULE | Freq: Three times a day (TID) | ORAL | Status: AC
  Administered 2014-07-11 – 2014-07-18 (×21): 500 mg via ORAL
  Filled 2014-07-11 (×10): qty 1
  Filled 2014-07-11: qty 2
  Filled 2014-07-11: qty 1
  Filled 2014-07-11: qty 2
  Filled 2014-07-11 (×12): qty 1

## 2014-07-11 MED ORDER — ALUM & MAG HYDROXIDE-SIMETH 200-200-20 MG/5ML PO SUSP
30.0000 mL | ORAL | Status: DC | PRN
  Administered 2014-07-18 – 2014-07-19 (×2): 30 mL via ORAL

## 2014-07-11 MED ORDER — SIMVASTATIN 10 MG PO TABS
10.0000 mg | ORAL_TABLET | Freq: Every day | ORAL | Status: DC
  Administered 2014-07-12 – 2014-07-25 (×13): 10 mg via ORAL
  Filled 2014-07-11 (×16): qty 1

## 2014-07-11 MED ORDER — ALPRAZOLAM 0.5 MG PO TABS: 0.5000 mg | ORAL_TABLET | Freq: Three times a day (TID) | ORAL | Status: DC | PRN

## 2014-07-11 MED ORDER — LOPERAMIDE HCL 2 MG PO CAPS
2.0000 mg | ORAL_CAPSULE | ORAL | Status: DC | PRN
  Administered 2014-07-15 – 2014-07-24 (×3): 2 mg via ORAL
  Filled 2014-07-11: qty 1
  Filled 2014-07-11: qty 2
  Filled 2014-07-11: qty 1

## 2014-07-11 MED ORDER — ALUM & MAG HYDROXIDE-SIMETH 200-200-20 MG/5ML PO SUSP
30.0000 mL | Freq: Once | ORAL | Status: AC
  Administered 2014-07-11: 30 mL via ORAL
  Filled 2014-07-11: qty 30

## 2014-07-11 MED ORDER — QUETIAPINE FUMARATE 200 MG PO TABS: 200.0000 mg | ORAL_TABLET | Freq: Every day | ORAL | Status: DC

## 2014-07-11 MED ORDER — PANTOPRAZOLE SODIUM 40 MG PO TBEC
40.0000 mg | DELAYED_RELEASE_TABLET | Freq: Every day | ORAL | Status: DC
  Administered 2014-07-12 – 2014-07-26 (×15): 40 mg via ORAL
  Filled 2014-07-11 (×19): qty 1

## 2014-07-11 NOTE — Consult Note (Signed)
[facility] Face-to-Face Psychiatry Consult   Reason for Consult:  Depression Referring Physician:  EDP  Kendra Simmons is an 63 y.o. female. Total Time spent with patient: 20 minutes  Assessment: AXIS I:  Major Depression, Recurrent severe AXIS II:  Deferred AXIS III:   Past Medical History  Diagnosis Date  . High cholesterol   . Depression   . Bipolar 1 disorder   . Arthritis   . Anxiety   . GERD (gastroesophageal reflux disease)    AXIS IV:  other psychosocial or environmental problems, problems related to social environment and problems with primary support group AXIS V:  21-30 behavior considerably influenced by delusions or hallucinations OR serious impairment in judgment, communication OR inability to function in almost all areas  Plan:  Recommend psychiatric Inpatient admission when medically cleared.  Dr. Adele Schilder assessed the patient and concurs with the plan.  Subjective:   Kendra Simmons is a 63 y.o. female patient admitted with depression.  HPI:  On admission:  63 y.o. female that presented as a walk-in at [facility] reporting SI and increasing depression. Pt stated she doesn't want to live anymore and kept repeating, "Help me! Help me! I am in a valley." Pt tearful throughout assessment and reported she felt weak because she hadn't been eating or drinking over the past week due other than "water and a little ice cream." Pt stated she recently changed psychiatrists. She was seeing [name] and then began seeing [name] at [facility] 7 weeks ago. Pt stated all of her medications were changed and she cannot recall what they all are because her sister has POA - [name] [phone]. Called pt's sister @ 42, [name], who reported from the best of her knowledge that she takes Xanax, Seroquel, Zoloft, a new sleep medication and cholesterol medication. Pt denies HI or psychosis. Pt endorses sx of depression and anxiety, including neurovegetative sx of depression such as not  grooming and staying in bed. Pt reports that she has a longstanding hx of depression with both inpatient and outpatient treatment. Pt stated she has been to [facility] "years ago." Pt stated that she is having conflict with her family, because they want her to help take care of her mother, who is an invalid. Pt stated she can barely take care of herself. She stated her sister dispenses her medications to her but they are not working. Pt tearful throughout session, oriented x 4, had depressed mood, and appeared anxious and bizarre, despite denying having AVH. Pt drove herself to [facility], "and I am not supposed to be driving." Pt lives alone, but has her sister here and one of her children. Today:  Patient remains depressed today, hopeless, suicidal ideations (plan to overdose, tired of living with the mental pain), sleep poor, appetite poor with minimal intake.  Admission needed.  HPI Elements:   Location:  generalized. Quality:  acute. Severity:  severe. Timing:  constant. Duration:  couple of weeks. Context:  stressors.  Past Psychiatric History: Past Medical History  Diagnosis Date  . High cholesterol   . Depression   . Bipolar 1 disorder   . Arthritis   . Anxiety   . GERD (gastroesophageal reflux disease)     reports that she has never smoked. She has never used smokeless tobacco. She reports that she does not drink alcohol or use illicit drugs. Family History  Problem Relation Age of Onset  . Sleep apnea Father   . Diabetes Mother   . Diabetes  Sister   . Diabetes Maternal Uncle   . Diabetes Cousin            Allergies:  No Known Allergies  ACT Assessment Complete:  Yes:    Educational Status    Risk to Self: Risk to self Is patient at risk for suicide?: No, but patient needs Medical Clearance Substance abuse history and/or treatment for substance abuse?: No  Risk to Others:    Abuse:    Prior Inpatient Therapy:    Prior Outpatient Therapy:    Additional Information:                     Objective: Blood pressure 133/77, pulse 85, temperature 97.9 F (36.6 C), temperature source Oral, resp. rate 20, SpO2 100.00%.There is no weight on file to calculate BMI. Results for orders placed during the hospital encounter of 07/10/14 (from the past 72 hour(s))  URINALYSIS, ROUTINE W REFLEX MICROSCOPIC     Status: Abnormal   Collection Time    07/10/14  3:52 PM      Result Value Ref Range   Color, Urine YELLOW  YELLOW   APPearance CLOUDY (*) CLEAR   Specific Gravity, Urine 1.021  1.005 - 1.030   pH 6.0  5.0 - 8.0   Glucose, UA NEGATIVE  NEGATIVE mg/dL   Hgb urine dipstick NEGATIVE  NEGATIVE   Bilirubin Urine NEGATIVE  NEGATIVE   Ketones, ur NEGATIVE  NEGATIVE mg/dL   Protein, ur NEGATIVE  NEGATIVE mg/dL   Urobilinogen, UA 0.2  0.0 - 1.0 mg/dL   Nitrite NEGATIVE  NEGATIVE   Leukocytes, UA SMALL (*) NEGATIVE  URINE MICROSCOPIC-ADD ON     Status: Abnormal   Collection Time    07/10/14  3:52 PM      Result Value Ref Range   Squamous Epithelial / LPF FEW (*) RARE   WBC, UA 11-20  <3 WBC/hpf   RBC / HPF 0-2  <3 RBC/hpf   Bacteria, UA RARE  RARE   Urine-Other MUCOUS PRESENT    URINE RAPID DRUG SCREEN (HOSP PERFORMED)     Status: Abnormal   Collection Time    07/10/14  3:53 PM      Result Value Ref Range   Opiates NONE DETECTED  NONE DETECTED   Cocaine NONE DETECTED  NONE DETECTED   Benzodiazepines POSITIVE (*) NONE DETECTED   Amphetamines NONE DETECTED  NONE DETECTED   Tetrahydrocannabinol NONE DETECTED  NONE DETECTED   Barbiturates NONE DETECTED  NONE DETECTED   Comment:            DRUG SCREEN FOR MEDICAL PURPOSES     ONLY.  IF CONFIRMATION IS NEEDED     FOR ANY PURPOSE, NOTIFY LAB     WITHIN 5 DAYS.                LOWEST DETECTABLE LIMITS     FOR URINE DRUG SCREEN     Drug Class       Cutoff (ng/mL)     Amphetamine      1000     Barbiturate      200     Benzodiazepine   893     Tricyclics       734     Opiates          300      Cocaine          300     THC  50  CBC     Status: None   Collection Time    07/10/14  6:00 PM      Result Value Ref Range   WBC 5.9  4.0 - 10.5 K/uL   RBC 4.25  3.87 - 5.11 MIL/uL   Hemoglobin 12.8  12.0 - 15.0 g/dL   HCT 38.3  36.0 - 46.0 %   MCV 90.1  78.0 - 100.0 fL   MCH 30.1  26.0 - 34.0 pg   MCHC 33.4  30.0 - 36.0 g/dL   RDW 13.2  11.5 - 15.5 %   Platelets 174  150 - 400 K/uL  COMPREHENSIVE METABOLIC PANEL     Status: Abnormal   Collection Time    07/10/14  6:00 PM      Result Value Ref Range   Sodium 140  137 - 147 mEq/L   Potassium 3.8  3.7 - 5.3 mEq/L   Chloride 102  96 - 112 mEq/L   CO2 26  19 - 32 mEq/L   Glucose, Bld 145 (*) 70 - 99 mg/dL   BUN 13  6 - 23 mg/dL   Creatinine, Ser 0.80  0.50 - 1.10 mg/dL   Calcium 10.0  8.4 - 10.5 mg/dL   Total Protein 7.3  6.0 - 8.3 g/dL   Albumin 4.2  3.5 - 5.2 g/dL   AST 21  0 - 37 U/L   ALT 10  0 - 35 U/L   Alkaline Phosphatase 65  39 - 117 U/L   Total Bilirubin 0.4  0.3 - 1.2 mg/dL   GFR calc non Af Amer 77 (*) >90 mL/min   GFR calc Af Amer 89 (*) >90 mL/min   Comment: (NOTE)     The eGFR has been calculated using the CKD EPI equation.     This calculation has not been validated in all clinical situations.     eGFR's persistently <90 mL/min signify possible Chronic Kidney     Disease.   Anion gap 12  5 - 15  SALICYLATE LEVEL     Status: Abnormal   Collection Time    07/10/14  6:00 PM      Result Value Ref Range   Salicylate Lvl <1.6 (*) 2.8 - 20.0 mg/dL  ETHANOL     Status: None   Collection Time    07/10/14  6:00 PM      Result Value Ref Range   Alcohol, Ethyl (B) <11  0 - 11 mg/dL   Comment:            LOWEST DETECTABLE LIMIT FOR     SERUM ALCOHOL IS 11 mg/dL     FOR MEDICAL PURPOSES ONLY  ACETAMINOPHEN LEVEL     Status: None   Collection Time    07/10/14  6:00 PM      Result Value Ref Range   Acetaminophen (Tylenol), Serum <15.0  10 - 30 ug/mL   Comment:            THERAPEUTIC CONCENTRATIONS  VARY     SIGNIFICANTLY. A RANGE OF 10-30     ug/mL MAY BE AN EFFECTIVE     CONCENTRATION FOR MANY PATIENTS.     HOWEVER, SOME ARE BEST TREATED     AT CONCENTRATIONS OUTSIDE THIS     RANGE.     ACETAMINOPHEN CONCENTRATIONS     >150 ug/mL AT 4 HOURS AFTER     INGESTION AND >50 ug/mL AT 12     HOURS  AFTER INGESTION ARE     OFTEN ASSOCIATED WITH TOXIC     REACTIONS.   Labs are reviewed and are pertinent for UTI, Keflex ordered.  Current Facility-Administered Medications  Medication Dose Route Frequency Provider Last Rate Last Dose  . ALPRAZolam Duanne Moron) tablet 1 mg  1 mg Oral TID PRN Virgel Manifold, MD   1 mg at 07/11/14 0525  . cephALEXin (KEFLEX) capsule 500 mg  500 mg Oral 3 times per day Waylan Boga, NP   500 mg at 07/11/14 0525  . doxepin (SINEQUAN) capsule 25 mg  25 mg Oral QHS PRN Virgel Manifold, MD      . loperamide (IMODIUM) capsule 2 mg  2 mg Oral PRN Lurena Nida, NP   2 mg at 07/11/14 0759  . pantoprazole (PROTONIX) EC tablet 40 mg  40 mg Oral Daily Virgel Manifold, MD   40 mg at 07/10/14 1627  . QUEtiapine (SEROQUEL) tablet 100 mg  100 mg Oral BID Virgel Manifold, MD   100 mg at 07/10/14 2053  . sertraline (ZOLOFT) tablet 100 mg  100 mg Oral Daily Waylan Boga, NP      . simvastatin (ZOCOR) tablet 10 mg  10 mg Oral q1800 Virgel Manifold, MD   10 mg at 07/10/14 1831  . tamsulosin (FLOMAX) capsule 0.4 mg  0.4 mg Oral Daily Virgel Manifold, MD   0.4 mg at 07/10/14 1707  . [START ON 07/14/2014] Vitamin D (Ergocalciferol) (DRISDOL) capsule 50,000 Units  50,000 Units Oral Q Wed Virgel Manifold, MD       Current Outpatient Prescriptions  Medication Sig Dispense Refill  . ALPRAZolam (XANAX) 1 MG tablet Take 1 tablet (1 mg total) by mouth 3 (three) times daily as needed for sleep or anxiety. anxiety  30 tablet  0  . doxepin (SINEQUAN) 25 MG capsule Take 25 mg by mouth at bedtime as needed (sleep).      [name] omeprazole (PRILOSEC) 20 MG capsule Take 20 mg by mouth daily.       . pravastatin  (PRAVACHOL) 20 MG tablet Take 20 mg by mouth at bedtime.       [name] QUEtiapine (SEROQUEL) 100 MG tablet Take 1 tablet by mouth 2 (two) times daily.      . sertraline (ZOLOFT) 100 MG tablet Take 50-100 mg by mouth daily. 50 mg for 7 days then increase to 100 mg daily      . tamsulosin (FLOMAX) 0.4 MG CAPS capsule Take 0.4 mg by mouth daily.      . Vitamin D, Ergocalciferol, (DRISDOL) 50000 UNITS CAPS Take 50,000 Units by mouth every 7 (seven) days. On Wednesday        Psychiatric Specialty Exam:     Blood pressure 133/77, pulse 85, temperature 97.9 F (36.6 C), temperature source Oral, resp. rate 20, SpO2 100.00%.There is no weight on file to calculate BMI.  General Appearance: Casual  Eye Contact::  Fair  Speech:  Normal Rate  Volume:  Normal  Mood:  Anxious and Depressed  Affect:  Congruent  Thought Process:  Coherent  Orientation:  Full (Time, Place, and Person)  Thought Content:  Rumination  Suicidal Thoughts:  Yes.  with intent/plan  Homicidal Thoughts:  No  Memory:  Immediate;   Fair Recent;   Fair Remote;   Fair  Judgement:  Fair  Insight:  Fair  Psychomotor Activity:  Decreased  Concentration:  Fair  Recall:  [employer] of Knowledge:Fair  Language: Fair  Akathisia:  No  Handed:  Right  AIMS (if indicated):     Assets:  Financial Resources/Insurance Housing Leisure Time Resilience Social Support  Sleep:      Musculoskeletal: Strength & Muscle Tone: within normal limits Gait & Station: normal Patient leans: N/A  Treatment Plan Summary: Daily contact with patient to assess and evaluate symptoms and progress in treatment Medication management  Waylan Boga, PMH-NP 07/11/2014 9:05 AM  I have personally seen the patient and agreed with the findings and involved in the treatment plan. Berniece Andreas, MD

## 2014-07-11 NOTE — ED Notes (Signed)
Dr Adele Schilder and Theodoro Clock in w/ pt

## 2014-07-11 NOTE — Progress Notes (Addendum)
Follow-up calls placed to the following facilities: [facility]- per Roselie Awkward pt has been declined d/t not meeting criteria [facility]- per Baxter Hire, at [facility]- per Maudie Mercury no beds on the unit needed for that pt right now [facility] per Angus Palms MD not admitting any pt's today [facility]- placed calls several times with no answer [facility]- per Juliann Pulse, referrals have not been reviewed yet [facility]- no answer [facility]- per Arville Go at capacity and no d/c's until Monday 7.27.15  In addition the following facilities contacted are at capacity: [facility]- per Jacelyn Pi 55+unit- per Legrand Como- per Marchelle Folks- per [facility]- per Madlyn Frankel- per Cornelia Copa- per Earvin Hansen- per [facility]- per Synetta Shadow- per Can[facility] Br[facility]- per Pam[facility] Hurry [facility]- per Devota Pace- per [facility]- per [facility]- per [facility] Disposition MHT

## 2014-07-11 NOTE — ED Notes (Addendum)
Pt reported diarrhea- medicated per orders

## 2014-07-11 NOTE — ED Notes (Signed)
Dr Adele Schilder and Theodoro Clock into see

## 2014-07-11 NOTE — ED Notes (Signed)
tts into see 

## 2014-07-11 NOTE — ED Notes (Addendum)
Pt ambulatory w/o difficulty to [facility] w/ [place], belongings given to driver. 

## 2014-07-11 NOTE — Tx Team (Signed)
Initial Interdisciplinary Treatment Plan  PATIENT STRENGTHS: (choose at least two) Ability for insight Active sense of humor Average or above average intelligence Capable of independent living Communication skills Financial means  PATIENT STRESSORS: Educational concerns Health problems   PROBLEM LIST: Problem List/Patient Goals Date to be addressed Date deferred Reason deferred Estimated date of resolution  Depression 07/11/14     Anxiety 2' Depression 07/11/14     GERD 07/11/14     UTI 07/11/14                                    DISCHARGE CRITERIA:  Ability to meet basic life and health needs Adequate post-discharge living arrangements Improved stabilization in mood, thinking, and/or behavior Medical problems require only outpatient monitoring Motivation to continue treatment in a less acute level of care  PRELIMINARY DISCHARGE PLAN: Return to previous living arrangement  PATIENT/FAMIILY INVOLVEMENT: This treatment plan has been presented to and reviewed with the patient, Kendra Simmons, and/or family member, .  The patient and family have been given the opportunity to ask questions and make suggestions.  Lauralyn Primes 07/11/2014, 6:29 PM

## 2014-07-11 NOTE — Plan of Care (Signed)
Problem: Diagnosis: Increased Risk For Suicide Attempt Goal: STG-Patient Will Attend All Groups On The Unit Outcome: Progressing Pt attended evening group.   Goal: STG-Patient Will Report Suicidal Feelings to Staff Outcome: Progressing Pt denies SI and said she will notify staff of any thoughts of self harm.   Goal: STG-Patient Will Comply With Medication Regime Outcome: Progressing Pt was compliant with medications as ordered this evening.    Problem: Ineffective individual coping Goal: STG: Patient will remain free from self harm Outcome: Progressing Pt has not harmed self this evening.  Denies thoughts of harming self.    Problem: Alteration in mood Goal: STG-Patient is able to discuss feelings and issues (Patient is able to discuss feelings and issues leading to depression)  Outcome: Progressing Pt reported she has issues with her mother's caregiver and she has been stressed out because her 73 year old mother is in hospice care and she and her sister have not been getting along as well as they usually do.   Goal: STG-Patient reports thoughts of self-harm to staff Outcome: Progressing Pt denies thoughts of self-harm and said she would notify staff if thinking of harming self.   Problem: Alteration in mood; excessive anxiety as evidenced by: Goal: STG-Pt will report an absence of self-harm thoughts/actions (Patient will report an absence of self-harm thoughts or actions)  Outcome: Progressing Denies thoughts of self-harm and pt has not harmed self this evening.

## 2014-07-11 NOTE — ED Notes (Signed)
Nad, watching tv, PO fluids encouraged

## 2014-07-11 NOTE — ED Notes (Signed)
Patient given Developing a Wellness Toolbox, Stress Interview, My Stress Management Ideas, Warning Signs for Relapse and Unhealthy Thought Patterns sheets as part of provided psychoeducational materials.

## 2014-07-11 NOTE — ED Notes (Signed)
Up to the bathroom and reports diarrhea

## 2014-07-11 NOTE — ED Notes (Signed)
Pt to transfer to [facility] later today when bed is available per [employer]

## 2014-07-11 NOTE — BH Assessment (Signed)
[facility] Assessment Progress Note  CSW completed and reviewed supporting paperwork with pt, voluntary consent and release of info consent, and faxed to TTS.  Originals given to RN.  Pt to admit to 503/1 at [facility] today.    Regan Lemming, LCSW 07/11/2014  2:50 PM

## 2014-07-11 NOTE — Progress Notes (Signed)
[facility] Notes:  (Nursing/MHT/Case Management/Adjunct)  Date:  07/11/2014  Time:  10:01 PM  Type of Therapy:  Psychoeducational Skills  Participation Level:  Active  Participation Quality:  Appropriate  Affect:  Depressed  Cognitive:  Appropriate  Insight:  Good  Engagement in Group:  Engaged  Modes of Intervention:  Education  Summary of Progress/Problems: The patient spoke at great length about her symptoms with the group. She states that she has not slept for the past five days and that she is a chronic insomniac. She also states that she has not eaten well for weeks and that she has not done well in seven weeks since her medication was changed. Her goal for tomorrow is to get her medication straightened out.   Shravya Wickwire S 07/11/2014, 10:01 PM

## 2014-07-11 NOTE — ED Notes (Signed)
Kendra Simmons  Here to transport

## 2014-07-11 NOTE — Progress Notes (Signed)
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 She says she has " always" been depressed and anxious and that it has come to " a head"  When  She recently changed doctors and her meds were changed and she has decompensated since then. She says her appetite  Has dropped off  And that she is in a " valley and can't get out of it". No eating and no sleeping " for about 8 weeks now". She demonstrates disorganized thinking. Flight of ideas with pressured speech and  Diff completing her  Thoughts and ideas as well as diff paying attention. [facility] contracts for safety, although she says she still has suicidal thoughts but knows she's not going to act on them.After pt is admitted, she is oriented to the unit and admission completed.

## 2014-07-11 NOTE — ED Notes (Signed)
Up to the desk on the phone 

## 2014-07-11 NOTE — Progress Notes (Signed)
D: Pt presents with anxious affect and mood.  Pt reported "I haven't slept in days.  I went to a different doctor and they took me off all of my medications.  I was taking Seroquel and Abilify and that worked for me."  Pt stated "I'm here because I've just been so depressed and stressed out.  I have a 63 year old mother and her caregiver isn't doing what she's supposed to.  I told my family and they all said that I was lying."  Pt denies SI/HI.  Denies AH/VH.   A: Medications administered per order.  Encouragement and support provided.  Safety maintained. R: Pt attended evening group.  Interacts appropriately with staff and peers.  Reports she will notify staff of any needs/concerns.  Pt is in no acute distress at this time.  Will continue to monitor for safety.

## 2014-07-12 ENCOUNTER — Encounter (HOSPITAL_COMMUNITY): Payer: Self-pay | Admitting: General Practice

## 2014-07-12 MED ORDER — ARIPIPRAZOLE 5 MG PO TABS
5.0000 mg | ORAL_TABLET | Freq: Every day | ORAL | Status: DC
  Administered 2014-07-12 – 2014-07-13 (×2): 5 mg via ORAL
  Filled 2014-07-12 (×5): qty 1

## 2014-07-12 MED ORDER — TRAZODONE HCL 50 MG PO TABS
50.0000 mg | ORAL_TABLET | Freq: Every evening | ORAL | Status: DC | PRN
  Administered 2014-07-12: 50 mg via ORAL

## 2014-07-12 MED ORDER — SERTRALINE HCL 50 MG PO TABS
150.0000 mg | ORAL_TABLET | Freq: Every day | ORAL | Status: DC
  Administered 2014-07-13 – 2014-07-14 (×2): 150 mg via ORAL
  Filled 2014-07-12 (×3): qty 3

## 2014-07-12 MED ORDER — ALPRAZOLAM 0.5 MG PO TABS
0.5000 mg | ORAL_TABLET | Freq: Three times a day (TID) | ORAL | Status: DC
  Administered 2014-07-12 – 2014-07-13 (×4): 0.5 mg via ORAL
  Filled 2014-07-12 (×5): qty 1

## 2014-07-12 NOTE — [facility] Suicide Risk Assessment (Signed)
   Nursing information obtained from:  Patient Demographic factors:  Divorced or widowed;Caucasian;Living alone;Unemployed Current Mental Status:  NA Loss Factors:  Decrease in vocational status;Decline in physical health Historical Factors:  NA Risk Reduction Factors:  Sense of responsibility to family;Positive therapeutic relationship;Positive coping skills or problem solving skills Total Time spent with patient: 45 minutes  CLINICAL FACTORS:   Depression:   Anhedonia  Psychiatric Specialty Exam: Physical Exam  ROS  SEE ADMIT NOTE MSE   COGNITIVE FEATURES THAT CONTRIBUTE TO RISK:  Closed-mindedness    SUICIDE RISK:   Moderate:  Frequent suicidal ideation with limited intensity, and duration, some specificity in terms of plans, no associated intent, good self-control, limited dysphoria/symptomatology, some risk factors present, and identifiable protective factors, including available and accessible social support.  PLAN OF CARE: Patient will be admitted to inpatient psychiatric unit for stabilization and safety. Will provide and encourage milieu participation. Provide medication management and maked adjustments as needed.  Will follow daily.    I certify that inpatient services furnished can reasonably be expected to improve the patient's condition.  Merek Niu, [place] 07/12/2014, 2:02 PM

## 2014-07-12 NOTE — Progress Notes (Signed)
Pt woke up and reported that she had a nightmare.  Pt confused, disorganized initially. Pt said "I didn't know where I was and everybody was distorted.  I saw a bunch of kids coming off of an elevator.  I don't ever have nightmares like that."  Pt oriented to person, place, date.  Support provided.  Pt allowed in day room.  Will continue to monitor for safety.
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
## 2014-07-12 NOTE — Progress Notes (Signed)
Patient ID: Kendra Simmons, female   DOB: March 19, 1951, 63 y.o.   MRN: [id number] PER STATE REGULATIONS 482.30  THIS CHART WAS REVIEWED FOR MEDICAL NECESSITY WITH RESPECT TO THE PATIENT'S ADMISSION/ DURATION OF STAY.  NEXT REVIEW DATE: 07/15/2014  Chauncy Lean, RN, BSN CASE MANAGER

## 2014-07-12 NOTE — Progress Notes (Signed)
NUTRITION ASSESSMENT  Pt meets criteria for severe MALNUTRITION in the context of chronic illness as evidenced by <50% estimated energy intake in the past month with 10.5% weight loss in the past 6 months.  Pt identified as at risk on the Malnutrition Screen Tool  INTERVENTION: Educated patient on the importance of nutrition and encouraged intake of food and beverages.   NUTRITION DIAGNOSIS: Unintentional weight loss related to sub-optimal intake as evidenced by pt report.   Goal: Pt to meet >/= 90% of their estimated nutrition needs.  Monitor:  PO intake  Assessment:  Pt with a long hx of depression and anxiety, presented to ED with worsening depression and insomnia.   - Met with pt who reports poor appetite for the past 2-3 weeks with pt consuming only small amounts of chicken noodle soup, ice cream, and coke  - States her weight has been stable however weight trend shows pt's weight down 21 pounds since January 2015 - Her appetite is getting better and pt is not interested in nutritional supplements    63 y.o. female  Height: Ht Readings from Last 1 Encounters:  07/11/14 '5\' 4"'  (1.626 m)    Weight: Wt Readings from Last 1 Encounters:  07/11/14 179 lb (81.194 kg)    Weight Hx: Wt Readings from Last 10 Encounters:  07/11/14 179 lb (81.194 kg)  01/04/14 200 lb (90.719 kg)  12/02/13 202 lb (91.627 kg)  02/12/13 206 lb 6.4 oz (93.622 kg)  12/16/12 220 lb 10.9 oz (100.1 kg)  10/27/12 207 lb 3.2 oz (93.985 kg)  10/20/12 200 lb (90.719 kg)  08/31/12 208 lb (94.348 kg)  10/18/11 205 lb 6.4 oz (93.169 kg)  04/05/10 171 lb (77.565 kg)    BMI:  Body mass index is 30.71 kg/(m^2). Pt meets criteria for class I obesity based on current BMI.  Estimated Nutritional Needs: Kcal: 15-20 kcal/kg Protein: > 1 gram protein/kg Fluid: 1 ml/kcal  Diet Order: Cardiac Pt is also offered choice of unit snacks mid-morning and mid-afternoon.  Pt is eating as desired.   Lab results  and medications reviewed. Getting 50,000 units of vitamin D weekly.   Carlis Stable MS, [place], LDN [phone] Pager [phone] Weekend/After Hours Pager

## 2014-07-12 NOTE — Progress Notes (Signed)
Patient ID: Kendra Simmons, female   DOB: 1951/11/21, 63 y.o.   MRN: [id number]  D: Pt. Denies SI/HI and A/V Hallucinations. Patient rates her depression, hopelessness, and anxiety at 8/10 for the day. Patient reports that she slept poorly due to a nightmare that woke her up. She reports that even when sleeping it is restless and of poor quality. Patient does not report any pain at this time.   A: Support and encouragement provided to the patient to come to writer with any questions or concerns. Scheduled medications administered to patient per physician's orders.  R: Patient is receptive and cooperative but minimal with this Probation officer. Patient is seen in the milieu and is attending groups. Q15 minute checks are maintained for safety.

## 2014-07-12 NOTE — [facility] Group Notes (Signed)
[place] LCSW Group Therapy  07/12/2014 1:15pm   Type of Therapy: Group Therapy- Overcoming Obstacles  Participation Level: Active   Participation Quality:  Appropriate  Affect:  Flat   Cognitive: Alert and Oriented   Insight:  Limited   Engagement in Therapy: Developing/Improving and Engaged   Modes of Intervention: Clarification, Confrontation, Discussion, Education, Exploration, Limit-setting, Orientation, Problem-solving, Rapport Building, Art therapist, Socialization and Support   Summary of Progress/Problems: Pt identified obstacles faced currently and processed barriers involved in overcoming these obstacles. Pt identified steps necessary for overcoming these obstacles and explored motivation (internal and external) for facing these difficulties head on. Pt further identified one area of concern in their lives and chose a goal to focus on for today. Pt seems to be somewhat disorganized, as she answered questions with slightly related comments but had difficulty identifying an obstacle she was facing.  Pt's identified obstacle was family conflict over the care of her mother, describing siblings "telling me not to confront the caretaker and it's making me sick."  Pt was not able to identify changes she could make in order to overcome this obstacle.  Pt at times would interrupt other peers' comments with minimally related content.      Therapeutic Modalities:   Cognitive Behavioral Therapy Solution Focused Therapy Motivational Interviewing Relapse Prevention Therapy  Peri Maris, LCSWA 06/07/2014 2:40 PM

## 2014-07-12 NOTE — [facility] Group Notes (Signed)
[facility] LCSW Aftercare Discharge Planning Group Note  07/12/2014 8:45 AM  Participation Quality: Alert, Appropriate and Oriented  Mood/Affect: "okay" mood; flat affect  Depression Rating: 8  Anxiety Rating: 8  Thoughts of Suicide: Pt denies SI/HI  Will you contract for safety? Yes  Current AVH: Pt denies  Plan for Discharge/Comments: Pt attended discharge planning group and actively participated in group. CSW provided pt with today's workbook. Pt reports that she follows up with [facility] but is unhappy with their care and would like another follow-up plan.  [place] but reports that she is anxious to return there.  Pt described having an intense nightmare last night and is anxious to go to sleep.   Transportation Means: Pt reports access to transportation  Supports: G. Nichols and Tori Milks, LCSWA 07/12/2014 9:50 AM

## 2014-07-13 LAB — HEMOGLOBIN A1C
Hgb A1c MFr Bld: 6.3 % — ABNORMAL HIGH (ref <5.7)
Mean Plasma Glucose: 134 mg/dL — ABNORMAL HIGH (ref <117)

## 2014-07-13 MED ORDER — ARIPIPRAZOLE 5 MG PO TABS
5.0000 mg | ORAL_TABLET | Freq: Two times a day (BID) | ORAL | Status: DC
  Administered 2014-07-13 – 2014-07-14 (×2): 5 mg via ORAL
  Filled 2014-07-13 (×5): qty 1

## 2014-07-13 MED ORDER — HYDROXYZINE HCL 50 MG PO TABS
50.0000 mg | ORAL_TABLET | Freq: Once | ORAL | Status: AC
  Administered 2014-07-13: 50 mg via ORAL

## 2014-07-13 MED ORDER — DOXEPIN HCL 25 MG PO CAPS
25.0000 mg | ORAL_CAPSULE | Freq: Every evening | ORAL | Status: DC | PRN
  Administered 2014-07-13 (×2): 25 mg via ORAL
  Filled 2014-07-13 (×6): qty 1

## 2014-07-13 MED ORDER — HYDROXYZINE HCL 50 MG PO TABS
ORAL_TABLET | ORAL | Status: AC
Start: 2014-07-13 — End: 2014-07-13
  Filled 2014-07-13: qty 1

## 2014-07-13 MED ORDER — CLONAZEPAM 0.5 MG PO TABS
0.5000 mg | ORAL_TABLET | Freq: Three times a day (TID) | ORAL | Status: DC
  Administered 2014-07-13 – 2014-07-14 (×2): 0.5 mg via ORAL
  Filled 2014-07-13 (×2): qty 1

## 2014-07-13 MED ORDER — ALPRAZOLAM 0.5 MG PO TABS
0.5000 mg | ORAL_TABLET | Freq: Once | ORAL | Status: AC
  Administered 2014-07-13: 0.5 mg via ORAL

## 2014-07-13 NOTE — Progress Notes (Addendum)
D:  Patient's self inventory sheet, patient had poor sleep last night, took sleep medication which did not help her.  Poor appetite, low energy level, poor concentration.  Rated depression, hopeless, anxiety #10.  Has experienced tremors, diarrhea, chilling, agitation, irritability in past 24 hours.  Denied SI.  Physical problems, lightheaded, exhausted cannot go on racing thoughts especially at bedtime.  Worst pain #2 in past 24 hours, neck pain.  "I have bottomed out physically, no sleep 5-6 nights, racing thoughts thinking.  Give list to phy of meds I was stable on until I visited [facility] took me off all my stable meds that family practice doctor started for 13 yrs no problems."  No discharge plans.  Will not return to [facility].  Need to see social worker on upmost needs, to call previous doctor and get meds stable again.   A:  Medications administered per MD orders.  Emotional support and encouragement.  R:  Denied SI/HI, contracts for safety.  Denied A/V hallucinations.   Safety maintained with 15 minute checks.   Patient has taken several medications this morning, then turns around and asks if she has taken her medications.   Patient stated that her last night meds and morning meds are not the same color.  Nurse has given patient the medication information from which the pill was enclosed to prove that correct med was given to patient.  Patient stated she knows she is confused.  Safety maintained with 15 minute checks.

## 2014-07-13 NOTE — Progress Notes (Signed)
Patient stated her thoughts have become racing, manic, can't turn anything off, constant thinking, thinking I am going to die, diseases I am going to die of, I can't go on physically/mentally, bottomed out, can't sleep, medication did not touch me last night.  Ativan, vistaril did not touch me.

## 2014-07-13 NOTE — Progress Notes (Signed)
Didn't attend group 

## 2014-07-13 NOTE — Tx Team (Signed)
Interdisciplinary Treatment Plan Update   Date Reviewed:  07/13/2014  Time Reviewed:  8:57 AM  Progress in Treatment:   Attending groups: Yes Participating in groups: Yes Taking medication as prescribed: Yes  Tolerating medication: Yes Family/Significant other contact made:  No, but will ask patient for consent for collateral contact Patient understands diagnosis: Yes  Discussing patient identified problems/goals with staff: Yes Medical problems stabilized or resolved: Yes Denies suicidal/homicidal ideation: Yes Patient has not harmed self or others: Yes  For review of initial/current patient goals, please see plan of care.  Estimated Length of Stay:  3-5 days  Reasons for Continued Hospitalization:  Anxiety Depression Medication stabilization  New Problems/Goals identified:    Discharge Plan or Barriers:   Home with outpatient follow up to be determined  Additional Comments:  Attendees:  Patient:  07/13/2014 8:57 AM   Signature:  Gabriel Earing, MD 07/13/2014 8:57 AM  Signature:  07/13/2014 8:57 AM  Signature:   07/13/2014 8:57 AM  Signature:Beverly Danelle Earthly, RN 07/13/2014 8:57 AM  Signature:  Thurnell Garbe RN 07/13/2014 8:57 AM  Signature:  Joette Catching, LCSW 07/13/2014 8:57 AM  Signature:  Peri Maris, LCSW-A 07/13/2014 8:57 AM  Signature:  Lucinda Dell, Care Coordinator [facility] 07/13/2014 8:57 AM  [place] 07/13/2014 8:57 AM  Signature: 07/13/2014  8:57 AM  Signature:   Lars Pinks, RN [facility] 07/13/2014  8:57 AM  Signature: 07/13/2014  8:57 AM    Scribe for Treatment Team:   Joette Catching,  07/13/2014 8:57 AM

## 2014-07-13 NOTE — Progress Notes (Signed)
Pt.is awake and complaining of insomnia. She reports having only 2 hours of sleep each night for the past 5 nights. She states she has to "stay on top of this anxiety" and reports she has only had two doses instead of three of her xanax today. Order received to give a now dose. She reports she is use to taking 2 blue xanax every night and one blue xanax every morning.She expresses unhappiness about not being on the same dose she was on at home. Reports she has discussed insomnia with her M.D. Trazadone ineffective tonight.

## 2014-07-13 NOTE — Progress Notes (Signed)
Remains awake. Complains of insomnia.

## 2014-07-13 NOTE — Progress Notes (Signed)
D: Patient denies SI/HI/AVH. Patient rates hopelessness as 8,  depression as 8, and anxiety as 6.  Patient affect is depressed. Mood is depressed.  Patient states, "I feel like my mother is being abused by a demon.  My mom is putting a guilt trip on [place] about going to a nursing home.  That's why we hired to lady to take care of her at home.  This lady has everyone fooled.  She's really a bully.  She pulls on my mom and treats her badly."  Patient did attend evening group. Patient visible on the milieu. No distress noted. A: Support and encouragement offered. Scheduled medications given to pt. Q 15 min checks continued for patient safety. R: Patient receptive. Patient remains safe on the unit.

## 2014-07-13 NOTE — Progress Notes (Signed)
The focus of this group is to educate the patient on the purpose and policies of crisis stabilization and provide a format to answer questions about their admission.  The group details unit policies and expectations of patients while admitted.  Patient did not attend morning nurse education orientation group.  Patient stayed in bed sleeping.

## 2014-07-13 NOTE — B[facility] Group Notes (Signed)
[place] LCSW Group Therapy      Feelings About Diagnosis 1:15 - 2:30 PM         07/13/2014    Type of Therapy:  Group Therapy  Participation Level:  Active  Participation Quality:  Appropriate  Affect:  Appropriate  Cognitive:  Alert and Appropriate  Insight:  Developing/Improving and Engaged  Engagement in Therapy:  Developing/Improving and Engaged  Modes of Intervention:  Discussion, Education, Exploration, Problem-Solving, Rapport Building, Support  Summary of Progress/Problems:  Patient actively participated in group. Patient discussed past and present diagnosis and the effects it has had on  life.  Patient talked about family and society being judgmental and the stigma associated with having a mental health diagnosis. Patient shared she has always been ashamed of having a diagnosis of depression.  She stated she sees herself different from family members.  [name] 07/13/2014

## 2014-07-13 NOTE — Progress Notes (Signed)
Recreation Therapy Notes  Animal-Assisted Activity/Therapy (AAA/T) Program Checklist/Progress Notes Patient Eligibility Criteria Checklist & Daily Group note for Rec Tx Intervention  Date: 07.28.2015 Time: 2:45pm Location: [address]    AAA/T Program Assumption of Risk Form signed by Patient/ or Parent Legal Guardian yes  Patient is free of allergies or sever asthma yes  Patient reports no fear of animals yes  Patient reports no history of cruelty to animals yes   Patient understands his/her participation is voluntary yes  Patient washes hands before animal contact yes  Patient washes hands after animal contact yes  Behavioral Response: Appropriate   Education: Hand Washing, Appropriate Animal Interaction   Education Outcome: Acknowledges understanding   Clinical Observations/Feedback: Patient interacted appropriately with therapeutic dog team and peers during session.   Laureen Ochs Valaria Kohut, LRT/CTRS  Lane Hacker 07/13/2014 4:36 PM

## 2014-07-13 NOTE — Progress Notes (Signed)
Pt observed lying in her bed awake.  She complains of racing thoughts and says she is afraid of not being able to sleep tonight.  She says she has not been able to sleep for several days and asked if the MD had ordered any med changes for the night.  Writer discussed the medications that were scheduled for her this evening.  Pt was agreeable to try the doxepin and hoped she would be able to sleep.  She denies SI/HI/AV at this time.  Pt plans to return home at discharge. She did not attend evening group tonight, but said she had attended some earlier today.  Pt was pleasant/cooperative.  Pt was encouraged to make her needs known to staff.  Support and encouragement offered.  Safety maintained with q15 minute checks.

## 2014-07-13 NOTE — Progress Notes (Signed)
[facility] MD Progress Note  07/13/2014 5:56 PM Kendra Simmons  MRN:  [id number] Subjective:  Pt seen and chart reviewed. Pt denies SI, HI, and AVH, contracts for safety. Pt reports having been on a "very effective medication regimen from a primary care doctor" and states that she decided to change providers for other reasons. Pt reports that she is very upset she made this decision and wants to be on a similar regimen to what she was on before. This NP discussed medications with pt while setting up current regimen. Pt expressed a desire to get on a longer acting benzo such as Klonopin and to come off the Xanax due to feeling unstable in between doses.   HPI: 63 year old female, Who reports a long history of depresison and anxiety, who presented to ED reporting worsening depression, worsening insomnia, describing her mood as being " in a valley I cannot get out of".. She states she was not having any suicidal ideations. She reported decreased PO intake. Had recently changed psychiatrists and her psychiatric medications were being changed. She attributes her decompensation to her psychiatric medications being changed.   Elements: Acute , severe exacerbation of depression  Associated Signs/Synptoms:  Depression Symptoms: depressed mood,  anhedonia,  insomnia,  recurrent thoughts of death,  decreased appetite,  (Hypo) Manic Symptoms: denies history of mania /hypomania, but recently has not been sleeping well and states she has been feeling " hyper" which she attributes to anxiety  Anxiety Symptoms: Excessive Worry,  Psychotic Symptoms: denies any psychotic symptoms  PTSD Symptoms:  Denies history of PTSD  Total Time spent with patient: 45 minutes   AXIS I: Major Depression, Recurrent severe, Anxiety Disorder NOS, consider Adjustment Disorder with Depressed Mood and Anxiety.  AXIS II: Deferred  AXIS III:  Past Medical History   Diagnosis  Date   .  High cholesterol    .  Depression    .   Bipolar 1 disorder    .  Arthritis    .  Anxiety    .  GERD (gastroesophageal reflux disease)     AXIS IV: family strain  AXIS V: 41-50 serious symptoms   ADL's:  Intact  Sleep: Fair  Appetite:  Good  Suicidal Ideation:  Denies Homicidal Ideation:  Denies AEB (as evidenced by):  Psychiatric Specialty Exam: Physical Exam  Review of Systems  Constitutional: Negative.   HENT: Negative.   Eyes: Negative.   Respiratory: Negative.   Cardiovascular: Negative.   Gastrointestinal: Negative.   Genitourinary: Negative.   Musculoskeletal: Negative.   Skin: Negative.   Neurological: Negative.   Endo/Heme/Allergies: Negative.   Psychiatric/Behavioral: Positive for depression. The patient is nervous/anxious.     Blood pressure 133/72, pulse 98, temperature 97.8 F (36.6 C), temperature source Oral, resp. rate 17, height 5\' 4"  (1.626 m), weight 81.194 kg (179 lb), SpO2 99.00%.Body mass index is 30.71 kg/(m^2).   General Appearance: Fairly Groomed   Engineer, water:: Good   Speech: Normal Rate   Volume: Normal   Mood: Anxious and Depressed   Affect: Depressed   Thought Process: Goal Directed and Linear   Orientation: NA- fully alert And attentive   Thought Content: denies hallucinations, no delusions   Suicidal Thoughts: No denies any suicidal or homicidal ideations, and contracts for safety on the unit   Homicidal Thoughts: No   Memory: NA   Judgement: Fair   Insight: Fair   Psychomotor Activity: Normal   Concentration: Good   Recall: Good  Fund of Knowledge:Good   Language: Good   Akathisia: Negative   Handed: Right   AIMS (if indicated):   Assets: Communication Skills  Desire for Improvement  Resilience   Sleep: Number of Hours: 4.25    Musculoskeletal:  Strength & Muscle Tone: within normal limits  Gait & Station: normal  Patient leans: N/A  Current Medications: Current Facility-Administered Medications  Medication Dose Route Frequency Provider Last Rate Last  Dose  . acetaminophen (TYLENOL) tablet 650 mg  650 mg Oral Q6H PRN Waylan Boga, NP   650 mg at 07/11/14 2139  . alum & mag hydroxide-simeth (MAALOX/MYLANTA) 200-200-20 MG/5ML suspension 30 mL  30 mL Oral Q4H PRN Waylan Boga, NP      . ARIPiprazole (ABILIFY) tablet 5 mg  5 mg Oral BID Benjamine Mola, FNP      . cephALEXin (KEFLEX) capsule 500 mg  500 mg Oral 3 times per day Waylan Boga, NP   500 mg at 07/13/14 1447  . clonazePAM (KLONOPIN) tablet 0.5 mg  0.5 mg Oral TID Benjamine Mola, FNP      . doxepin (SINEQUAN) capsule 25 mg  25 mg Oral QHS,MR X 1 Ranyia Witting C Devani Odonnel, FNP      . loperamide (IMODIUM) capsule 2 mg  2 mg Oral PRN Waylan Boga, NP      . magnesium hydroxide (MILK OF MAGNESIA) suspension 30 mL  30 mL Oral Daily PRN Waylan Boga, NP      . pantoprazole (PROTONIX) EC tablet 40 mg  40 mg Oral Daily Waylan Boga, NP   40 mg at 07/13/14 0836  . sertraline (ZOLOFT) tablet 150 mg  150 mg Oral Daily Neita Garnet, MD   150 mg at 07/13/14 0837  . simvastatin (ZOCOR) tablet 10 mg  10 mg Oral q1800 Waylan Boga, NP   10 mg at 07/12/14 1820  . tamsulosin (FLOMAX) capsule 0.4 mg  0.4 mg Oral Daily Waylan Boga, NP   0.4 mg at 07/13/14 0938  . [START ON 07/14/2014] Vitamin D (Ergocalciferol) (DRISDOL) capsule 50,000 Units  50,000 Units Oral Q Wed Waylan Boga, NP        Lab Results:  Results for orders placed during the hospital encounter of 07/11/14 (from the past 48 hour(s))  HEMOGLOBIN A1C     Status: Abnormal   Collection Time    07/13/14  6:21 AM      Result Value Ref Range   Hemoglobin A1C 6.3 (*) <5.7 %   Comment: (NOTE)                                                                               According to the ADA Clinical Practice Recommendations for 2011, when     HbA1c is used as a screening test:      >=6.5%   Diagnostic of Diabetes Mellitus               (if abnormal result is confirmed)     5.7-6.4%   Increased risk of developing Diabetes Mellitus      References:Diagnosis and Classification of Diabetes Mellitus,Diabetes     HWEX,9371,69(CVELF 1):S62-S69 and Standards of Medical Care in  Diabetes - 2011,Diabetes Care,2011,34 (Suppl 1):S11-S61.   Mean Plasma Glucose 134 (*) <117 mg/dL   Comment: Performed at [facility]    Physical Findings: AIMS: Facial and Oral Movements Muscles of Facial Expression: None, normal Lips and Perioral Area: None, normal Jaw: None, normal Tongue: None, normal,Extremity Movements Upper (arms, wrists, hands, fingers): None, normal Lower (legs, knees, ankles, toes): None, normal, Trunk Movements Neck, shoulders, hips: None, normal, Overall Severity Severity of abnormal movements (highest score from questions above): None, normal Incapacitation due to abnormal movements: None, normal Patient's awareness of abnormal movements (rate only patient's report): No Awareness, Dental Status Current problems with teeth and/or dentures?: No Does patient usually wear dentures?: No  CIWA:  CIWA-Ar Total: 3 COWS:  COWS Total Score: 3  Treatment Plan Summary: Daily contact with patient to assess and evaluate symptoms and progress in treatment Medication management  Plan:  Review of chart, vital signs, medications, and notes.  1-Individual and group therapy  2-Medication management for depression and anxiety: Medications reviewed with the patient and she stated no untoward effects.  -Change Xanax 0.5mg  tid to Klonopin 0.5mg  tid -Increase Abilify from 5mg  daily to 5mg  bid -Discontinue Trazodone (pt cites horrible nightmares) -Doxepin 25mg  qhs PRN insomnia  3-Coping skills for depression, anxiety  4-Continue crisis stabilization and management  5-Address health issues--monitoring vital signs, stable  6-Treatment plan in progress to prevent relapse of depression and anxiety  Medical Decision Making Problem Points:  Established problem, stable/improving (1), Review of last therapy session (1)  and Review of psycho-social stressors (1) Data Points:  Review or order clinical lab tests (1) Review or order medicine tests (1) Review of medication regiment & side effects (2) Review of new medications or change in dosage (2)  I certify that inpatient services furnished can reasonably be expected to improve the patient's condition.   Benjamine Mola, FNP-BC 07/13/2014, 5:56 PM

## 2014-07-13 NOTE — Progress Notes (Signed)
Pt. reports she feels "shakey" "I think I am going through withdraw." Reports her medications were changed 6 weeks ago. Pt. reports medications we have given her tonight,"Made me worse."  She has a list of the medications she was on before her medications were changed to share with M.D. In the a.m. "I am going to die.I am going to end up in a mental institution." Support and reassurance given with some decrease in her anxiety. Poor results from Vistaril,Trazadone,and xanax .5mg .Pt. Seems confused about dose of xanax she has been taking at home. She also reports her medical doctor had her on medications for years and when she went to the psychiatry they said it was too much,changed her medication and it has messed her up.

## 2014-07-14 MED ORDER — QUETIAPINE FUMARATE 50 MG PO TABS
150.0000 mg | ORAL_TABLET | Freq: Every day | ORAL | Status: DC
  Administered 2014-07-14: 150 mg via ORAL
  Filled 2014-07-14 (×3): qty 3

## 2014-07-14 MED ORDER — CLONAZEPAM 1 MG PO TABS
1.0000 mg | ORAL_TABLET | Freq: Every day | ORAL | Status: DC
  Administered 2014-07-14 – 2014-07-15 (×2): 1 mg via ORAL
  Filled 2014-07-14 (×2): qty 1

## 2014-07-14 MED ORDER — CLONAZEPAM 0.5 MG PO TABS
0.5000 mg | ORAL_TABLET | Freq: Every day | ORAL | Status: DC
  Administered 2014-07-15 – 2014-07-16 (×2): 0.5 mg via ORAL
  Filled 2014-07-14 (×2): qty 1

## 2014-07-14 MED ORDER — QUETIAPINE FUMARATE 100 MG PO TABS
100.0000 mg | ORAL_TABLET | Freq: Every day | ORAL | Status: DC
  Filled 2014-07-14: qty 1

## 2014-07-14 MED ORDER — SERTRALINE HCL 100 MG PO TABS
100.0000 mg | ORAL_TABLET | Freq: Every day | ORAL | Status: DC
  Administered 2014-07-15 – 2014-07-16 (×2): 100 mg via ORAL
  Filled 2014-07-14 (×4): qty 1

## 2014-07-14 NOTE — Progress Notes (Addendum)
Patient ID: KEYETTA HOLLINGWORTH, female   DOB: 10-16-1951, 63 y.o.   MRN: [id number] [facility] MD Progress Note  07/14/2014 12:00 PM ARWILDA GEORGIA  MRN:  [id number] Subjective:   Patient complains of insomnia, and racing thoughts at night time.   Objective  Patient states her mood is depressed, but she reports she is less anxious. She is quite focused on insomnia, and states she has been unable to sleep well in several days. States " if I  Don't sleep I'm going to get sicker and die". We reviewed her psychiatric history and she is not endorsing any clear history  of mania or hypomania, but does describe racing thoughts and insomnia/restlessness at this time. She states it is hard for her to " disconnect  Her brain" at night time.  She is not presenting with any pressured speech, restlessness, or agitation at this time. With her consent I spoke with her pharmacist, and reviewed her recent psychiatric medication regimen, which included Seroquel 100 mgrs BID, Zoloft 100 mgrs a day, Xanax 0.5 mgrs TID,  and Doxepin. Patient states that Doxepin had been stopped prior, and she states she was also on Abilify, although it is not clear if she was taking Seroquel and Abilify simultaneously. Patient states that regimen was helping .  Total Time spent with patient: 25 minutes   AXIS I: Major Depression, Recurrent severe, Anxiety Disorder NOS, consider Adjustment Disorder with Depressed Mood and Anxiety.     ADL's:  Intact  Sleep: Fair  Appetite:  Good  Suicidal Ideation:  Denies Homicidal Ideation:  Denies AEB (as evidenced by):  Psychiatric Specialty Exam: Physical Exam  Review of Systems  Constitutional: Negative.   HENT: Negative.   Eyes: Negative.   Respiratory: Negative.   Cardiovascular: Negative.   Gastrointestinal: Negative.   Genitourinary: Negative.   Musculoskeletal: Negative.   Skin: Negative.   Neurological: Negative.   Endo/Heme/Allergies: Negative.    Psychiatric/Behavioral: Positive for depression. The patient is nervous/anxious.     Blood pressure 128/80, pulse 93, temperature 98 F (36.7 C), temperature source Oral, resp. rate 20, height 5\' 4"  (1.626 m), weight 81.194 kg (179 lb), SpO2 99.00%.Body mass index is 30.71 kg/(m^2).   General Appearance: Fairly Groomed   Engineer, water:: Good   Speech: Normal Rate   Volume: Normal   Mood: Depressed   Affect: anxious, and subtly irritable  Thought Process: Goal Directed and Linear - no flight of ideations  Orientation: NA- fully alert And attentive   Thought Content: denies hallucinations, no delusions   Suicidal Thoughts: No denies any suicidal or homicidal ideations, and contracts for safety on the unit   Homicidal Thoughts: No   Memory: NA   Judgement: Fair   Insight: Fair   Psychomotor Activity: Normal - no psychomotor restlessness noted   Concentration: Good   Recall: Good   Fund of Knowledge:Good   Language: Good   Akathisia: Negative   Handed: Right   AIMS (if indicated):   Assets: Communication Skills  Desire for Improvement  Resilience   Sleep: Number of Hours: 4.25    Musculoskeletal:  Strength & Muscle Tone: within normal limits  Gait & Station: normal  Patient leans: N/A  Current Medications: Current Facility-Administered Medications  Medication Dose Route Frequency Provider Last Rate Last Dose  . acetaminophen (TYLENOL) tablet 650 mg  650 mg Oral Q6H PRN Waylan Boga, NP   650 mg at 07/14/14 0641  . alum & mag hydroxide-simeth (MAALOX/MYLANTA) 200-200-20 MG/5ML suspension 30 mL  30 mL Oral Q4H PRN Waylan Boga, NP      . cephALEXin (KEFLEX) capsule 500 mg  500 mg Oral 3 times per day Waylan Boga, NP   500 mg at 07/14/14 4098  . [START ON 07/15/2014] clonazePAM (KLONOPIN) tablet 0.5 mg  0.5 mg Oral q morning - 10a Neita Garnet, MD      . clonazePAM [facility]) tablet 1 mg  1 mg Oral QHS Neita Garnet, MD      . loperamide (IMODIUM) capsule 2 mg  2 mg Oral PRN  Waylan Boga, NP      . magnesium hydroxide (MILK OF MAGNESIA) suspension 30 mL  30 mL Oral Daily PRN Waylan Boga, NP      . pantoprazole (PROTONIX) EC tablet 40 mg  40 mg Oral Daily Waylan Boga, NP   40 mg at 07/14/14 0820  . QUEtiapine (SEROQUEL) tablet 100 mg  100 mg Oral QHS Neita Garnet, MD      . Derrill Memo ON 07/15/2014] sertraline (ZOLOFT) tablet 100 mg  100 mg Oral Daily Neita Garnet, MD      . simvastatin (ZOCOR) tablet 10 mg  10 mg Oral q1800 Waylan Boga, NP   10 mg at 07/13/14 1830  . tamsulosin (FLOMAX) capsule 0.4 mg  0.4 mg Oral Daily Waylan Boga, NP   0.4 mg at 07/14/14 0819  . Vitamin D (Ergocalciferol) (DRISDOL) capsule 50,000 Units  50,000 Units Oral Q Wed Waylan Boga, NP   50,000 Units at 07/14/14 1007    Lab Results:  Results for orders placed during the hospital encounter of 07/11/14 (from the past 48 hour(s))  HEMOGLOBIN A1C     Status: Abnormal   Collection Time    07/13/14  6:21 AM      Result Value Ref Range   Hemoglobin A1C 6.3 (*) <5.7 %   Comment: (NOTE)                                                                               According to the ADA Clinical Practice Recommendations for 2011, when     HbA1c is used as a screening test:      >=6.5%   Diagnostic of Diabetes Mellitus               (if abnormal result is confirmed)     5.7-6.4%   Increased risk of developing Diabetes Mellitus     References:Diagnosis and Classification of Diabetes Mellitus,Diabetes     [id number](AOZHY 1):S62-S69 and Standards of Medical Care in             Diabetes - 2011,Diabetes Care,2011,34 (Suppl 1):S11-S61.   Mean Plasma Glucose 134 (*) <117 mg/dL   Comment: Performed at [facility]    Physical Findings: AIMS: Facial and Oral Movements Muscles of Facial Expression: None, normal Lips and Perioral Area: None, normal Jaw: None, normal Tongue: None, normal,Extremity Movements Upper (arms, wrists, hands, fingers): None, normal Lower (legs, knees,  ankles, toes): None, normal, Trunk Movements Neck, shoulders, hips: None, normal, Overall Severity Severity of abnormal movements (highest score from questions above): None, normal Incapacitation due to abnormal movements: None, normal Patient's awareness of abnormal movements (rate only patient's report): No  Awareness, Dental Status Current problems with teeth and/or dentures?: No Does patient usually wear dentures?: No  CIWA:  CIWA-Ar Total: 3 COWS:  COWS Total Score: 3  Assessment: At this time patient is describing insomnia, racing thoughts , particularly at night time , and is focused on insomnia, worrying it will result in her physical health to deteriorate. She states her prior psychiatric medication regimen was working and that she became ill when the medications were stopped, changed. As per pharmacy, medication regimen consisted of Seroquel, Zoloft, Xanax.  Treatment Plan Summary: Daily contact with patient to assess and evaluate symptoms and progress in treatment Medication management  Plan:  Continue inpatient care and support, milieu Change Klonopin to 0.5 mgr QAM and 1 mgr QHS . D/C Abilify/ Start Seroquel 150 mgrs QHS, change Zoloft to 100 mgrs QAM    Medical Decision Making Problem Points:  Established problem, worsening (2) and Review of last therapy session (1) Data Points:  Review or order clinical lab tests (1) Review of new medications or change in dosage (2)  I certify that inpatient services furnished can reasonably be expected to improve the patient's condition.   Neita Garnet, MD   07/14/2014, 12:00 PM

## 2014-07-14 NOTE — BHH Suicide Risk Assessment (Signed)
[place] INPATIENT:  Family/Significant Other Suicide Prevention Education  Suicide Prevention Education:  Education Completed; [name], Sister, [phone];  has been identified by the patient as the family member/significant other with whom the patient will be residing, and identified as the person(s) who will aid the patient in the event of a mental health crisis (suicidal ideations/suicide attempt).  With written consent from the patient, the family member/significant other has been provided the following suicide prevention education, prior to the and/or following the discharge of the patient.  The suicide prevention education provided includes the following:  Suicide risk factors  Suicide prevention and interventions  National Suicide Hotline telephone number  [facility] assessment telephone number  [facility] Emergency Assistance [place] and/or Residential Mobile Crisis Unit telephone number  Request made of family/significant other to:  Remove weapons (e.g., guns, rifles, knives), all items previously/currently identified as safety concern.  Sister advised patient does not have access to weapons.      Remove drugs/medications (over-the-counter, prescriptions, illicit drugs), all items previously/currently identified as a safety concern.  The family member/significant other verbalizes understanding of the suicide prevention education information provided.  The family member/significant other agrees to remove the items of safety concern listed above.  [name] 07/14/2014, 3:08 PM

## 2014-07-14 NOTE — BHH Group Notes (Signed)
Adult Psychoeducational Group Note  Date:  07/14/2014 Time:  11:39 PM  Group Topic/Focus:  Wrap-Up Group:   The focus of this group is to help patients review their daily goal of treatment and discuss progress on daily workbooks.  Participation Level:  Active  Participation Quality:  Appropriate  Affect:  Flat  Cognitive:  Alert, Appropriate and Oriented  Insight: Improving  Engagement in Group:  Improving  Modes of Intervention:  Discussion and Support  Additional Comments:  Pt stated that her day was a "blow out day." staff asked pt if she could explain what she meant by that and pt shared that she has not slept in 7 days and has not been able to concentrate because of it. Pt did report that she ate better today but that she just can not sleep. One thing that makes the pt happy is her daughter.   [name] P 07/14/2014, 11:39 PM

## 2014-07-14 NOTE — Progress Notes (Signed)
D: Patient appropriate and cooperative with staff. Patient's affect and mood depressed/anxious. She's visible in the milieu. Compliant with medication regimen.  A: Support and encouragement provided to patient. Scheduled medications administered per MD orders. Maintain Q15 minute checks for safety.  R: Patient receptive. Denies SI/HI/AVH. Patient remains safe.

## 2014-07-14 NOTE — Progress Notes (Signed)
Pt reports that she still has not had any sleep, although pt was observed dozing off/on last night.  Pt says she is still having racing thoughts and that she cannot continue to function like this .  Discussed the med changes that were made today and encouraged pt to stop thinking about not sleeping and try to think positively that the medications will work tonight.  Encouraged pt to begin relaxing now, even before she takes her hs meds.  Pt has been observed sitting in the dayroom watching TV most of the time this evening.  Pt denies SI/HI/AV.  Pt plans to return home at discharge and is hopeful that the medications will help her.  Support and encouragement offered.  Pt is cooperative with staff.  Safety maintained with q15 minute checks.

## 2014-07-14 NOTE — BHH Group Notes (Signed)
[facility] LCSW Aftercare Discharge Planning Group Note   07/14/2014 11:49 AM    Participation Quality: did not attend group.    Jule Schlabach, Eulas Post
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
## 2014-07-14 NOTE — BHH Group Notes (Signed)
[place] LCSW Group Therapy  Emotional Regulation 1:15 - 2: 30 PM        07/14/2014     Type of Therapy:  Group Therapy  Participation Level:  Appropriate  Participation Quality:  Appropriate  Affect:  Appropriate  Cognitive:  Attentive Appropriate  Insight:  Developing/Improving Engaged  Engagement in Therapy:  Developing/Improving Engaged  Modes of Intervention:  Discussion Exploration Problem-Solving Supportive  Summary of Progress/Problems:  Group topic was emotional regulations.  Patient participated in the discussion and was able to identify an emotion that needed to regulated. She shared she has a lot of anger towards her family but often does not express her feelings.   Patient was able to identify approprite coping skills.  Kendra Simmons 07/14/2014

## 2014-07-15 LAB — GLUCOSE, RANDOM: Glucose, Bld: 130 mg/dL — ABNORMAL HIGH (ref 70–99)

## 2014-07-15 MED ORDER — QUETIAPINE FUMARATE 200 MG PO TABS
200.0000 mg | ORAL_TABLET | Freq: Every day | ORAL | Status: DC
  Administered 2014-07-15 – 2014-07-16 (×2): 200 mg via ORAL
  Filled 2014-07-15 (×5): qty 1

## 2014-07-15 NOTE — Progress Notes (Signed)
Patient ID: Kendra Simmons, female   DOB: Sep 19, 1951, 63 y.o.   MRN: [id number] D: Patient reports having racing thoughts about her medical issues is now reporting panic attacks. Pt reports increase anxiety about other patients.  Pt mood and affect appeared depressed and anxious. Pt denies SI/HI/AVH and pain. Pt attended evening wrap up group and engage in discussion.   Cooperative with assessment. No acute distressed noted at this time.   A: Met with pt 1:1. Pt encouraged to use breathing technique to help relieve panic attack. Medications administered as prescribed. Writer encouraged pt to discuss feelings. Pt encouraged to come to staff with any question or concerns.   R: Patient remains safe. She is complaint with medications and denies any adverse reaction. Continue current POC.

## 2014-07-15 NOTE — Progress Notes (Signed)
D: Patient has anxious affect and mood. She reported on the self inventory sheet that sleep and ability to concentrate are both poor, appetite is fair and energy level is low. Patient rated depression, feelings of hopelessness and anxiety "5". She's actively participating in groups and interactive with peers in the dayroom. Patient adheres to the current medication regimen.  A: Support and encouragement provided to patient. Administered scheduled medications per ordering MD. Monitor Q15 minute checks for safety.  R: Patient receptive. Denies SI/HI and AVH. Patient remains safe on the unit.

## 2014-07-15 NOTE — BHH Group Notes (Signed)
[facility] Mental Health Association Group Therapy 07/15/2014 1:15pm  Type of Therapy: Mental Health Association Presentation  Participation Level: Active  Participation Quality: Attentive  Affect: Appropriate  Cognitive: Oriented  Insight: Developing/Improving  Engagement in Therapy: Engaged  Modes of Intervention: Discussion, Education and Socialization  Summary of Progress/Problems: Mental Health Association ([place]) Speaker came to talk about his personal journey with substance abuse and addiction. The pt processed ways by which to relate to the speaker. [place] speaker provided handouts and educational information pertaining to groups and services offered by the [facility]. Pt was attentive to group speaker and took materials that he provided about the [facility] and their courses/support groups.  Pt expressed interest in attending groups after discharge.   Peri Maris, [place] 07/15/2014 3:06 PM

## 2014-07-15 NOTE — Progress Notes (Signed)
Adult Psychoeducational Group Note  Date:  07/15/2014 Time: 10:00am Group Topic/Focus:  Making Healthy Choices:   The focus of this group is to help patients identify negative/unhealthy choices they were using prior to admission and identify positive/healthier coping strategies to replace them upon discharge.  Participation Level:  Active  Participation Quality:  Appropriate and Attentive  Affect:  Appropriate  Cognitive:  Alert and Appropriate  Insight: Appropriate  Engagement in Group:  Engaged  Modes of Intervention:  Discussion and Education  Additional Comments: Pt attended and participated in group. Discussion was on lifestyle changes and what change could you make to improve your life. Pt stated talk to the doctor about my medications and start being more active.   Marlowe Shores D 07/15/2014, 1:04 PM
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
## 2014-07-15 NOTE — Progress Notes (Signed)
Patient ID: Kendra Simmons, female   DOB: 17-Sep-1951, 63 y.o.   MRN: [id number] PER STATE REGULATIONS 482.30  THIS CHART WAS REVIEWED FOR MEDICAL NECESSITY WITH RESPECT TO THE PATIENT'S ADMISSION/ DURATION OF STAY.  NEXT REVIEW DATE: 07/19/2014  Chauncy Lean, RN, BSN CASE MANAGER

## 2014-07-15 NOTE — Progress Notes (Signed)
Adult Psychoeducational Group Note  Date:  07/15/2014 Time:  10:14 PM  Group Topic/Focus:  Wrap-Up Group:   The focus of this group is to help patients review their daily goal of treatment and discuss progress on daily workbooks.  Participation Level:  Active  Participation Quality:  Appropriate  Affect:  Appropriate  Cognitive:  Alert and Oriented  Insight: Appropriate  Engagement in Group:  Developing/Improving  Modes of Intervention:  Exploration and Support  Additional Comments:  Patient stated that her day started off positive because she was able to get three hours of sleep. Patient stated that her family came.  Deannie Resetar, [place] 07/15/2014, 10:14 PM

## 2014-07-15 NOTE — Progress Notes (Signed)
Adult Psychoeducational Group Note  Date:  07/15/2014 Time:  8:45 AM  Group Topic/Focus:  Morning Wellness Group  Participation Level:  Active  Participation Quality:  Appropriate and Attentive  Affect:  Appropriate  Cognitive:  Alert and Oriented  Insight: Good  Engagement in Group:  Engaged  Modes of Intervention:  Discussion  Additional Comments: Patient voiced that she enjoys going to the movies in her leisure, as a way to relieve stress.  Eduard Roux E 07/15/2014, 10:33 AM

## 2014-07-16 LAB — GLUCOSE, CAPILLARY: GLUCOSE-CAPILLARY: 118 mg/dL — AB (ref 70–99)

## 2014-07-16 MED ORDER — SERTRALINE HCL 50 MG PO TABS
150.0000 mg | ORAL_TABLET | Freq: Every day | ORAL | Status: DC
  Administered 2014-07-17 – 2014-07-19 (×3): 150 mg via ORAL
  Filled 2014-07-16 (×4): qty 3

## 2014-07-16 MED ORDER — CLONAZEPAM 0.5 MG PO TABS
0.5000 mg | ORAL_TABLET | Freq: Three times a day (TID) | ORAL | Status: DC
  Administered 2014-07-16 – 2014-07-18 (×5): 0.5 mg via ORAL
  Filled 2014-07-16 (×5): qty 1

## 2014-07-16 NOTE — Progress Notes (Signed)
Patient ID: Kendra Simmons, female   DOB: September 06, 1951, 63 y.o.   MRN: [id number] D: Patient  reports having racing thoughts but her day was much better. Pt reports interaction with peers is helping with her anxiety. Pt mood and affect appeared depressed and anxious. Pt denies SI/HI/AVH and pain. Pt attended evening wrap up group and engage in discussion.  Cooperative with assessment. No acute distressed noted at this time.   A: Met with pt 1:1. Medications administered as prescribed. Writer encouraged pt to discuss feelings. Pt encouraged to come to staff with any question or concerns.   R: Patient remains safe. She is complaint with medications and denies any adverse reaction. Continue current POC.

## 2014-07-16 NOTE — Progress Notes (Signed)
Adult Psychoeducational Group Note  Date:  07/16/2014 Time:  11:08 PM  Group Topic/Focus:  Wrap-Up Group:   The focus of this group is to help patients review their daily goal of treatment and discuss progress on daily workbooks.  Participation Level:  Active  Participation Quality:  Appropriate  Affect:  Appropriate  Cognitive:  Appropriate  Insight: Appropriate  Engagement in Group:  Engaged  Modes of Intervention:  Discussion  Additional Comments:  Pt was present for wrap up group. She reported that she is improving her sleep and eating schedules. She said her goal is to get better. This Probation officer prompted her to be more specific. She was unable to clarify more than stating that she wants to be well enough to go see her daughter. I asked her to brainstorm what wellness means to her.   [name] A 07/16/2014, 11:08 PM

## 2014-07-16 NOTE — Progress Notes (Signed)
D: Patient continues to present with anxious affect and mood; c/o racing thoughts; Probation officer informed the MD regarding the patient's concerns and that she doesn't feel safe to care for self if she was to discharge home. She reported on the self inventory sheet that sleep and ability to concentrate are both poor, appetite is fair and energy level is low. Patient rates depression "5", feelings of hopelessness "8" and anxiety "10". She's attending scheduled group sessions throughout the day. Patient in compliance with all medications.  A: Support and encouragement provided to patient. Scheduled medications administered per MD orders. Maintain Q15 minute checks for safety.  R: Patient receptive. Denies SI/HI. Patient remains safe.

## 2014-07-16 NOTE — Progress Notes (Signed)
Patient ID: Kendra Simmons, female   DOB: 1951/01/16, 63 y.o.   MRN: [id number] [facility] MD Progress Note  07/16/2014 3:18 PM Kendra Simmons  MRN:  [id number] Subjective:  Remains " anxious" , but does agree she is feeling better than upon admission Objective  I have discussed case with treatment team and have met with patient. Patient presents partially improved compared to her initial presentation. She presents with less depression, and with a fuller range of affect. She does continue to feel subjectively anxious and " afraid", and states she is " definitely not feeling ready to leave yet". She is tolerating medications well, except for feeling slightly groggy in the AM. Gait steady. Sleep has improved partially compared to admission, about " 5 hours". On the unit has been going to groups, although tends to " keep my distance from other patients because I feel kind of afraid".  Family members came to visit, and she states it went better than what she thought .    Total Time spent with patient: 20 minutes   AXIS I: Major Depression, Recurrent severe, Anxiety Disorder NOS, consider Adjustment Disorder with Depressed Mood and Anxiety.     ADL's:  Intact  Sleep: improving   Appetite:  Good  Suicidal Ideation:  Denies Homicidal Ideation:  Denies AEB (as evidenced by):  Psychiatric Specialty Exam: Physical Exam  Review of Systems  Constitutional: Negative.   HENT: Negative.   Eyes: Negative.   Respiratory: Negative.   Cardiovascular: Negative.   Gastrointestinal: Negative.   Genitourinary: Negative.   Musculoskeletal: Negative.   Skin: Negative.   Neurological: Negative.   Endo/Heme/Allergies: Negative.   Psychiatric/Behavioral: Positive for depression. The patient is nervous/anxious.     Blood pressure 132/71, pulse 89, temperature 98.5 F (36.9 C), temperature source Oral, resp. rate 16, height '5\' 4"'  (1.626 m), weight 81.194 kg (179 lb), SpO2 99.00%.Body mass index is  30.71 kg/(m^2).   General Appearance: Fairly Groomed   Engineer, water:: Good   Speech: Normal Rate   Volume: Normal   Mood: Less depressed  Affect:  Improved mood and affect, still vaguely anxious.   Thought Process: Goal Directed and Linear, remains somewhat focused on stressors, primarily mother's  Failing health and siblings' decision to hire someone she dislikes to take care of mother.  Orientation: NA- fully alert And attentive   Thought Content: denies hallucinations, no delusions   Suicidal Thoughts: No   Homicidal Thoughts: No   Memory: NA   Judgement: Fair   Insight: Fair   Psychomotor Activity: feels anxious, and subjectively agitated but clearly improved compared to admission  Concentration: Good   Recall: Good   Fund of Knowledge:Good   Language: Good   Akathisia: Negative   Handed: Right   AIMS (if indicated):   Assets: Communication Skills  Desire for Improvement  Resilience   Sleep: Number of Hours: 4.25    Musculoskeletal:  Strength & Muscle Tone: within normal limits  Gait & Station: normal  Patient leans: N/A  Current Medications: Current Facility-Administered Medications  Medication Dose Route Frequency Provider Last Rate Last Dose  . acetaminophen (TYLENOL) tablet 650 mg  650 mg Oral Q6H PRN Waylan Boga, NP   650 mg at 07/15/14 5427  . alum & mag hydroxide-simeth (MAALOX/MYLANTA) 200-200-20 MG/5ML suspension 30 mL  30 mL Oral Q4H PRN Waylan Boga, NP      . cephALEXin (KEFLEX) capsule 500 mg  500 mg Oral 3 times per day Waylan Boga, NP   500  mg at 07/16/14 1457  . clonazePAM (KLONOPIN) tablet 0.5 mg  0.5 mg Oral Daily Neita Garnet, MD   0.5 mg at 07/16/14 0759  . clonazePAM (KLONOPIN) tablet 1 mg  1 mg Oral QHS Neita Garnet, MD   1 mg at 07/15/14 2238  . loperamide (IMODIUM) capsule 2 mg  2 mg Oral PRN Waylan Boga, NP   2 mg at 07/15/14 0955  . magnesium hydroxide (MILK OF MAGNESIA) suspension 30 mL  30 mL Oral Daily PRN Waylan Boga, NP      .  pantoprazole (PROTONIX) EC tablet 40 mg  40 mg Oral Daily Waylan Boga, NP   40 mg at 07/16/14 0759  . QUEtiapine (SEROQUEL) tablet 200 mg  200 mg Oral QHS Neita Garnet, MD   200 mg at 07/15/14 2238  . sertraline (ZOLOFT) tablet 100 mg  100 mg Oral Daily Neita Garnet, MD   100 mg at 07/16/14 0759  . simvastatin (ZOCOR) tablet 10 mg  10 mg Oral q1800 Waylan Boga, NP   10 mg at 07/15/14 1813  . tamsulosin (FLOMAX) capsule 0.4 mg  0.4 mg Oral Daily Waylan Boga, NP   0.4 mg at 07/16/14 0759  . Vitamin D (Ergocalciferol) (DRISDOL) capsule 50,000 Units  50,000 Units Oral Q Wed Waylan Boga, NP   50,000 Units at 07/14/14 1007    Lab Results:  Results for orders placed during the hospital encounter of 07/11/14 (from the past 48 hour(s))  GLUCOSE, RANDOM     Status: Abnormal   Collection Time    07/15/14  6:23 AM      Result Value Ref Range   Glucose, Bld 130 (*) 70 - 99 mg/dL   Comment: Performed at [place], [facility]     Status: Abnormal   Collection Time    07/15/14 10:45 PM      Result Value Ref Range   Glucose-Capillary 118 (*) 70 - 99 mg/dL    Physical Findings: AIMS: Facial and Oral Movements Muscles of Facial Expression: None, normal Lips and Perioral Area: None, normal Jaw: None, normal Tongue: None, normal,Extremity Movements Upper (arms, wrists, hands, fingers): None, normal Lower (legs, knees, ankles, toes): None, normal, Trunk Movements Neck, shoulders, hips: None, normal, Overall Severity Severity of abnormal movements (highest score from questions above): None, normal Incapacitation due to abnormal movements: None, normal Patient's awareness of abnormal movements (rate only patient's report): No Awareness, Dental Status Current problems with teeth and/or dentures?: No Does patient usually wear dentures?: No  CIWA:  CIWA-Ar Total: 3 COWS:  COWS Total Score: 3  Assessment: Some improvement, less depressed. Still anxious, ruminative,  but range of affect improved compared to admission. Tolerating medications well.   Treatment Plan Summary: Daily contact with patient to assess and evaluate symptoms and progress in treatment Medication management  Plan:  Continue inpatient care and support, milieu Change Klonopin to 0.5 mgr TID, as she feels this would better manage her anxiety, which tends to worsen in the afternoons, continue   Seroquel   200 mgrs QHS, increase   Zoloft to 150 mgrs QAM Will follow    Medical Decision Making Problem Points:  Established problem, stable/improving (1) and Review of last therapy session (1) Data Points:  Review of new medications or change in dosage (2)  I certify that inpatient services furnished can reasonably be expected to improve the patient's condition.   Neita Garnet, MD   07/16/2014, 3:18 PM

## 2014-07-16 NOTE — Progress Notes (Signed)
Adult Psychoeducational Group Note  Date:  07/16/2014 Time:  11:37 AM  Group Topic/Focus:  Relapse Prevention Planning:   The focus of this group is to define relapse and discuss the need for planning to combat relapse.  Participation Level:  Active  Participation Quality:  Appropriate  Affect:  Appropriate  Cognitive:  Appropriate  Insight: Appropriate  Engagement in Group:  Engaged  Modes of Intervention:  Discussion  Additional Comments:  Purpose of group was to discuss relapse prevention. Pt stated that admitting self to hospital during early signs of relapse has been the most helpful.  Wyonia Hough 07/16/2014, 11:37 AM

## 2014-07-16 NOTE — [facility] Group Notes (Signed)
[facility] LCSW Group Therapy  Feelings Around Relapse 1:15 -2:30        07/16/2014   Type of Therapy:  Group Therapy  Participation Level:  Appropriate  Participation Quality:  Appropriate  Affect:  Appropriate  Cognitive:  Attentive Appropriate  Insight:  Developing/Improving  Engagement in Therapy: Developing/Improving  Modes of Intervention:  Discussion Exploration Problem-Solving Supportive  Summary of Progress/Problems:  The topic for today was feelings around relapse.    Patient processed feelings toward relapse and was able to relate to peers. Patient shared she would be thinking negative thoughts about herself.  Patient identified coping skills that can be used to prevent a relapse.   Kendra Simmons 07/16/2014

## 2014-07-17 MED ORDER — QUETIAPINE FUMARATE 50 MG PO TABS
250.0000 mg | ORAL_TABLET | Freq: Every day | ORAL | Status: DC
  Administered 2014-07-17: 250 mg via ORAL
  Filled 2014-07-17 (×4): qty 1
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
## 2014-07-17 NOTE — Progress Notes (Signed)
Psychoeducational Group Note  Date: 07/17/2014 Time:  1015  Group Topic/Focus:  Identifying Needs:   The focus of this group is to help patients identify their personal needs that have been historically problematic and identify healthy behaviors to address their needs.  Participation Level:  Active  Participation Quality:  Attentive  Affect:  Appropriate  Cognitive:  Oriented  Insight:  Improving  Engagement in Group:  Engaged  Additional Comments:  Attended the group and partisapated  Paulino Rily

## 2014-07-17 NOTE — Progress Notes (Signed)
D.  Pt pleasant on approach, complaint of anxiety and headache.  Pt is also continuing to be concerned about her BP.  Pt was positive for evening wrap up group, and was given homework assignment by [facility] on coping skills.  Pt continues to endorse passive SI but contracts for safety.  Pt denies HI/hallucinations at this time.  A.  Support and encouragement offered  R. Pt remains safe on unit, will continue to monitor.

## 2014-07-17 NOTE — Progress Notes (Signed)
Adult Psychoeducational Group Note  Date:  07/17/2014 Time:  11:06 PM  Group Topic/Focus:  Wrap-Up Group:   The focus of this group is to help patients review their daily goal of treatment and discuss progress on daily workbooks.  Participation Level:  Active  Participation Quality:  Appropriate  Affect:  Appropriate  Cognitive:  Appropriate  Insight: Appropriate  Engagement in Group:  Engaged  Modes of Intervention:  Discussion  Additional Comments:  Pt was present for wrap up group. She reported that this stay has opened lines of communication with her family and enabled her to tell them she needs more support. She said that she needs more assistance identifying coping skills. This Probation officer followed up with her and helped her to identify some. She identified that speaking positively to herself out loud helps slow down her thinking and helps to calm her. She said that telling herself "You are okay," is helpful. She was very pleasant this evening.   [name] A 07/17/2014, 11:06 PM

## 2014-07-17 NOTE — [facility] Group Notes (Signed)
[facility] Group Notes:  (Clinical Social Work)  07/17/2014   1:15-2:15PM  Summary of Progress/Problems:   The main focus of today's process group was for the patient to identify ways in which they have sabotaged their own mental health wellness/recovery.  Motivational interviewing and a handout were used to explore the benefits and costs of their self-sabotaging behavior as well as the benefits and costs of changing this behavior.  The Stages of Change were explained to the group using a handout, and patients identified where they are with regard to changing self-defeating behaviors.  The patient expressed she self-sabotages with people pleasing.  When the group talked about isolation, she stated she craves being with other people all the time.  She makes a lot of negative statements to herself, and she compares herself to her sisters constantly, saying they have nice houses and things which she herself used to have and will never have again.  We talked about comparing ourselves to others and the harm done.  The other group members gave her feedback about her kindness and helpfulness to them, and she reported that she hears this a lot in her life.  She was encouraged to think about this instead of about her sisters, especially because what she thinks goes on perfectly in their lives may very well not be true.  Type of Therapy:  Process Group  Participation Level:  Active  Participation Quality:  Appropriate, Sharing and Supportive  Affect:  Blunted  Cognitive:  Alert and Oriented  Insight:  Engaged  Engagement in Therapy:  Engaged  Modes of Intervention:  Education, Motivational Interviewing   [employer], LCSW 07/17/2014, 4:00pm

## 2014-07-17 NOTE — Progress Notes (Signed)
Patient ID: Kendra Simmons, female   DOB: [birth year], 63 y.o.   MRN: [id number] [facility] MD Progress Note  07/17/2014 6:04 PM DALLY OSHEL  MRN:  [id number] Subjective:  Pt seen and chart reviewed. Pt denies SI, HI, and VH, contracts for safety. However, pt continues to present as very anxious and ruminates about medication dosage concerns. Pt in agreement to continue to increase Seroquel daily to determine effective dose for auditory hallucinations. Pt denies during this assessment but does report that it happened earlier today and is intermittent and unpredictable.    Total Time spent with patient: 25 minutes   AXIS I: Major Depression, Recurrent severe, Anxiety Disorder NOS, consider Adjustment Disorder with Depressed Mood and Anxiety.     ADL's:  Intact  Sleep: improving   Appetite:  Good  Suicidal Ideation:  Denies Homicidal Ideation:  Denies AEB (as evidenced by):  Psychiatric Specialty Exam: Physical Exam  Review of Systems  Constitutional: Negative.   HENT: Negative.   Eyes: Negative.   Respiratory: Negative.   Cardiovascular: Negative.   Gastrointestinal: Negative.   Genitourinary: Negative.   Musculoskeletal: Negative.   Skin: Negative.   Neurological: Negative.   Endo/Heme/Allergies: Negative.   Psychiatric/Behavioral: Positive for depression. The patient is nervous/anxious.     Blood pressure 142/88, pulse 124, temperature 98.6 F (37 C), temperature source Oral, resp. rate 16, height 5\' 4"  (1.626 m), weight 81.194 kg (179 lb), SpO2 99.00%.Body mass index is 30.71 kg/(m^2).   General Appearance: Fairly Groomed   Engineer, water:: Good   Speech: Normal Rate   Volume: Normal   Mood: Less depressed  Affect:  Improved mood and affect, still vaguely anxious.   Thought Process: Goal Directed and Linear, remains somewhat focused on stressors, primarily mother's  Failing health and siblings' decision to hire someone she dislikes to take care of mother.   Orientation: NA- fully alert And attentive   Thought Content: denies hallucinations, no delusions   Suicidal Thoughts: No   Homicidal Thoughts: No   Memory: NA   Judgement: Fair   Insight: Fair   Psychomotor Activity: feels anxious, and subjectively agitated but clearly improved compared to admission  Concentration: Good   Recall: Good   Fund of Knowledge:Good   Language: Good   Akathisia: Negative   Handed: Right   AIMS (if indicated):   Assets: Communication Skills  Desire for Improvement  Resilience   Sleep: Number of Hours: 4.25    Musculoskeletal:  Strength & Muscle Tone: within normal limits  Gait & Station: normal  Patient leans: N/A  Current Medications: Current Facility-Administered Medications  Medication Dose Route Frequency Provider Last Rate Last Dose  . acetaminophen (TYLENOL) tablet 650 mg  650 mg Oral Q6H PRN Waylan Boga, NP   650 mg at 07/17/14 1025  . alum & mag hydroxide-simeth (MAALOX/MYLANTA) 200-200-20 MG/5ML suspension 30 mL  30 mL Oral Q4H PRN Waylan Boga, NP      . cephALEXin (KEFLEX) capsule 500 mg  500 mg Oral 3 times per day Waylan Boga, NP   500 mg at 07/17/14 1619  . clonazePAM (KLONOPIN) tablet 0.5 mg  0.5 mg Oral TID Neita Garnet, MD   0.5 mg at 07/17/14 1710  . loperamide (IMODIUM) capsule 2 mg  2 mg Oral PRN Waylan Boga, NP   2 mg at 07/15/14 0955  . magnesium hydroxide (MILK OF MAGNESIA) suspension 30 mL  30 mL Oral Daily PRN Waylan Boga, NP      . pantoprazole (PROTONIX) EC  tablet 40 mg  40 mg Oral Daily Waylan Boga, NP   40 mg at 07/17/14 6063  . QUEtiapine (SEROQUEL) tablet 250 mg  250 mg Oral QHS Benjamine Mola, FNP      . sertraline (ZOLOFT) tablet 150 mg  150 mg Oral Daily Neita Garnet, MD   150 mg at 07/17/14 0831  . simvastatin (ZOCOR) tablet 10 mg  10 mg Oral q1800 Waylan Boga, NP   10 mg at 07/17/14 1711  . tamsulosin (FLOMAX) capsule 0.4 mg  0.4 mg Oral Daily Waylan Boga, NP   0.4 mg at 07/17/14 0160  . Vitamin D  (Ergocalciferol) (DRISDOL) capsule 50,000 Units  50,000 Units Oral Q Wed Waylan Boga, NP   50,000 Units at 07/14/14 1007    Lab Results:  Results for orders placed during the hospital encounter of 07/11/14 (from the past 48 hour(s))  GLUCOSE, CAPILLARY     Status: Abnormal   Collection Time    07/15/14 10:45 PM      Result Value Ref Range   Glucose-Capillary 118 (*) 70 - 99 mg/dL    Physical Findings: AIMS: Facial and Oral Movements Muscles of Facial Expression: None, normal Lips and Perioral Area: None, normal Jaw: None, normal Tongue: None, normal,Extremity Movements Upper (arms, wrists, hands, fingers): None, normal Lower (legs, knees, ankles, toes): None, normal, Trunk Movements Neck, shoulders, hips: None, normal, Overall Severity Severity of abnormal movements (highest score from questions above): None, normal Incapacitation due to abnormal movements: None, normal Patient's awareness of abnormal movements (rate only patient's report): No Awareness, Dental Status Current problems with teeth and/or dentures?: No Does patient usually wear dentures?: No  CIWA:  CIWA-Ar Total: 3 COWS:  COWS Total Score: 3  Assessment: Some improvement, less depressed. Still anxious, ruminative, but range of affect improved compared to admission. Tolerating medications well.   Treatment Plan Summary: Daily contact with patient to assess and evaluate symptoms and progress in treatment Medication management  Plan:  Continue inpatient care and support, milieu Change Klonopin to 0.5 mgr TID, as she feels this would better manage her anxiety, which tends to worsen in the afternoons, continue   Seroquel   250 mgrs QHS, increase   Zoloft to 150 mgrs QAM Will follow     Medical Decision Making Problem Points:  Established problem, stable/improving (1) and Review of last therapy session (1) Data Points:  Review of new medications or change in dosage (2)  I certify that inpatient services  furnished can reasonably be expected to improve the patient's condition.   Benjamine Mola, FNP-BC 07/17/2014, 6:04 PM  Patient seen, evaluated and I agree with notes by Nurse Practitioner. Corena Pilgrim, MD

## 2014-07-17 NOTE — Progress Notes (Signed)
.  Psychoeducational Group Note    Date: 07/17/2014 Time: 0930  Goal Setting Purpose of Group: To be able to set a goal that is measurable and that can be accomplished in one day Participation Level:  Active  Participation Quality:  Appropriate  Affect:  Appropriate  Cognitive:  Oriented  Insight:  Improving  Engagement in Group:  Engaged  Additional Comments:  Pt participated and was responsive in the group. Was able to set a goal.  Paulino Rily

## 2014-07-18 MED ORDER — QUETIAPINE FUMARATE 50 MG PO TABS: 350.0000 mg | ORAL_TABLET | Freq: Every day | ORAL | Status: DC

## 2014-07-18 MED ORDER — AMLODIPINE BESYLATE 5 MG PO TABS
5.0000 mg | ORAL_TABLET | Freq: Every day | ORAL | Status: DC
  Administered 2014-07-18 – 2014-07-20 (×3): 5 mg via ORAL
  Filled 2014-07-18 (×7): qty 1

## 2014-07-18 MED ORDER — CLONAZEPAM 0.5 MG PO TABS
0.5000 mg | ORAL_TABLET | Freq: Three times a day (TID) | ORAL | Status: DC
  Administered 2014-07-18 – 2014-07-19 (×4): 0.5 mg via ORAL
  Filled 2014-07-18 (×4): qty 1

## 2014-07-18 MED ORDER — DOXEPIN HCL 25 MG PO CAPS
25.0000 mg | ORAL_CAPSULE | Freq: Every evening | ORAL | Status: DC | PRN
  Administered 2014-07-18: 25 mg via ORAL
  Filled 2014-07-18 (×4): qty 1

## 2014-07-18 MED ORDER — QUETIAPINE FUMARATE 400 MG PO TABS
400.0000 mg | ORAL_TABLET | Freq: Every day | ORAL | Status: DC
  Administered 2014-07-18 – 2014-07-19 (×2): 400 mg via ORAL
  Filled 2014-07-18 (×4): qty 1

## 2014-07-18 NOTE — Progress Notes (Signed)
Patient ID: Kendra Simmons, female   DOB: April 05, 1951, 63 y.o.   MRN: [id number] [facility] MD Progress Note  07/18/2014 1:38 PM Kendra Simmons  MRN:  [id number] Subjective:  Pt seen and chart reviewed. Pt denies SI, HI, and VH, contracts for safety. Pt continues to ruminate about medications and fears of weight gain and side effects. Pt's seroquel increased to 400mg  qhs; Doxepin added for insomnia; Trazodone removed due to hungover feeling. Pt is very anxious about her medication regimen, asking for changes often.     Total Time spent with patient: 25 minutes   AXIS I: Major Depression, Recurrent severe, Anxiety Disorder NOS, consider Adjustment Disorder with Depressed Mood and Anxiety.     ADL's:  Intact  Sleep: improving   Appetite:  Good  Suicidal Ideation:  Denies Homicidal Ideation:  Denies AEB (as evidenced by):  Psychiatric Specialty Exam: Physical Exam  Review of Systems  Constitutional: Negative.   HENT: Negative.   Eyes: Negative.   Respiratory: Negative.   Cardiovascular: Negative.   Gastrointestinal: Negative.   Genitourinary: Negative.   Musculoskeletal: Negative.   Skin: Negative.   Neurological: Negative.   Endo/Heme/Allergies: Negative.   Psychiatric/Behavioral: Positive for depression. The patient is nervous/anxious.     Blood pressure 128/85, pulse 110, temperature 97.5 F (36.4 C), temperature source Oral, resp. rate 20, height 5\' 4"  (1.626 m), weight 81.194 kg (179 lb), SpO2 98.00%.Body mass index is 30.71 kg/(m^2).   General Appearance: Fairly Groomed   Engineer, water:: Good   Speech: Normal Rate   Volume: Normal   Mood: Less depressed  Affect:  Improved mood and affect, still vaguely anxious.   Thought Process: Goal Directed and Linear, remains somewhat focused on stressors, primarily mother's  Failing health and siblings' decision to hire someone she dislikes to take care of mother.  Orientation: NA- fully alert And attentive   Thought Content:  denies hallucinations, no delusions   Suicidal Thoughts: No   Homicidal Thoughts: No   Memory: NA   Judgement: Fair   Insight: Fair   Psychomotor Activity: feels anxious, and subjectively agitated but clearly improved compared to admission  Concentration: Good   Recall: Good   Fund of Knowledge:Good   Language: Good   Akathisia: Negative   Handed: Right   AIMS (if indicated):   Assets: Communication Skills  Desire for Improvement  Resilience   Sleep: Number of Hours: 4.25    Musculoskeletal:  Strength & Muscle Tone: within normal limits  Gait & Station: normal  Patient leans: N/A  Current Medications: Current Facility-Administered Medications  Medication Dose Route Frequency Provider Last Rate Last Dose  . acetaminophen (TYLENOL) tablet 650 mg  650 mg Oral Q6H PRN Waylan Boga, NP   650 mg at 07/18/14 1212  . alum & mag hydroxide-simeth (MAALOX/MYLANTA) 200-200-20 MG/5ML suspension 30 mL  30 mL Oral Q4H PRN Waylan Boga, NP   30 mL at 07/18/14 0236  . cephALEXin (KEFLEX) capsule 500 mg  500 mg Oral 3 times per day Waylan Boga, NP   500 mg at 07/18/14 0603  . clonazePAM (KLONOPIN) tablet 0.5 mg  0.5 mg Oral TID Benjamine Mola, FNP      . doxepin (SINEQUAN) capsule 25 mg  25 mg Oral QHS,MR X 1 John C Withrow, FNP      . loperamide (IMODIUM) capsule 2 mg  2 mg Oral PRN Waylan Boga, NP   2 mg at 07/15/14 0955  . magnesium hydroxide (MILK OF MAGNESIA) suspension 30 mL  30 mL Oral Daily PRN Waylan Boga, NP      . pantoprazole (PROTONIX) EC tablet 40 mg  40 mg Oral Daily Waylan Boga, NP   40 mg at 07/18/14 0826  . QUEtiapine (SEROQUEL) tablet 400 mg  400 mg Oral QHS Benjamine Mola, FNP      . sertraline (ZOLOFT) tablet 150 mg  150 mg Oral Daily Neita Garnet, MD   150 mg at 07/18/14 8110  . simvastatin (ZOCOR) tablet 10 mg  10 mg Oral q1800 Waylan Boga, NP   10 mg at 07/17/14 1711  . tamsulosin (FLOMAX) capsule 0.4 mg  0.4 mg Oral Daily Waylan Boga, NP   0.4 mg at 07/18/14 0826   . Vitamin D (Ergocalciferol) (DRISDOL) capsule 50,000 Units  50,000 Units Oral Q Wed Waylan Boga, NP   50,000 Units at 07/14/14 1007    Lab Results:  No results found for this or any previous visit (from the past 48 hour(s)).  Physical Findings: AIMS: Facial and Oral Movements Muscles of Facial Expression: None, normal Lips and Perioral Area: None, normal Jaw: None, normal Tongue: None, normal,Extremity Movements Upper (arms, wrists, hands, fingers): None, normal Lower (legs, knees, ankles, toes): None, normal, Trunk Movements Neck, shoulders, hips: None, normal, Overall Severity Severity of abnormal movements (highest score from questions above): None, normal Incapacitation due to abnormal movements: None, normal Patient's awareness of abnormal movements (rate only patient's report): No Awareness, Dental Status Current problems with teeth and/or dentures?: No Does patient usually wear dentures?: No  CIWA:  CIWA-Ar Total: 3 COWS:  COWS Total Score: 3  Assessment: Some improvement, less depressed. Still anxious, ruminative, but range of affect improved compared to admission. Tolerating medications well.   Treatment Plan Summary: Daily contact with patient to assess and evaluate symptoms and progress in treatment Medication management  Plan:  Continue inpatient care and support, milieu Change Klonopin to 0.5 mgr TID, as she feels this would better manage her anxiety, which tends to worsen in the afternoons. Change Seroquel  To  400 mgrs QHS, -Continue  Zoloft 150 mgrs QAM -Add Doxepin 25mg  qhs for insomnia -Remove Trazodone qhs due to hungover feeling     Medical Decision Making Problem Points:  Established problem, stable/improving (1) and Review of last therapy session (1) Data Points:  Review of new medications or change in dosage (2)  I certify that inpatient services furnished can reasonably be expected to improve the patient's condition.   Benjamine Mola,  FNP-BC 07/18/2014, 1:38 PM  Patient seen, evaluated and I agree with notes by Nurse Practitioner. Corena Pilgrim, MD

## 2014-07-18 NOTE — B[facility] Group Notes (Signed)
[facility] Group Notes:  (Clinical Social Work)  07/18/2014   1:15-2:15PM  Summary of Progress/Problems:  The main focus of today's process group was to   identify the patient's current support system and decide on other supports that can be put in place.  The picture on workbook was used to discuss why additional supports are needed.  An emphasis was placed on using counselor, doctor, therapy groups, 12-step groups, and problem-specific support groups to expand supports.   There was also an extensive discussion about what constitutes a healthy support versus an unhealthy support.  The patient expressed full comprehension of the concepts presented.  Current unhealthy supports are her family members who ignored her illness.  She turned to a friend from 6th grade.  She is open to adding additional supports, and took notes throughout group.  Type of Therapy:  Process Group  Participation Level:  Active  Participation Quality:  Attentive and Sharing  Affect:  Blunted and Depressed  Cognitive:  Appropriate and Oriented  Insight:  Engaged  Engagement in Therapy:  Engaged  Modes of Intervention:  Education,  Support and [employer], LCSW 07/18/2014, 4:00pm

## 2014-07-18 NOTE — Progress Notes (Signed)
Psychoeducational Group Note  Date:  07/18/2014 Time:  1015  Group Topic/Focus:  Making Healthy Choices:   The focus of this group is to help patients identify negative/unhealthy choices they were using prior to admission and identify positive/healthier coping strategies to replace them upon discharge.  Participation Level:  Active  Participation Quality:  Appropriate  Affect:  Flat  Cognitive:  Oriented  Insight:  Improving  Engagement in Group:  Engaged  Additional Comments:  Pt participated and was engaged with the discussion  Paulino Rily 07/18/2014

## 2014-07-18 NOTE — Progress Notes (Signed)
D) Pt has been focused on the fact she did not sleep last night. Feels it was because her klonopin was given at 5pm as scheduled and not held until the bedtime. Pt's affect is flat and mood depressed. Focused on her medications and verbalizes increased anxiety. Rates her depression at a 6 her hopelessness at a 7 and her anxiety at a 10. Denies SI and HI.  A) Given support, reassurance and praise. Encouragement given Provided with a 1:1.  R) Pt denies SI and HI.

## 2014-07-18 NOTE — Progress Notes (Signed)
[facility] Group Notes:  (Nursing/MHT/Case Management/Adjunct)  Date:  07/18/2014  Time:  9:57 PM  Type of Therapy:  Group Therapy  Participation Level:  Minimal  Participation Quality:  Attentive  Affect:  Flat  Cognitive:  Appropriate  Insight:  Good  Engagement in Group:  Engaged  Modes of Intervention:  Discussion  Summary of Progress/Problems: Patient stated that she had a good day today. Pt says that she had no panic attacks today. She had a visit from her sister and that went well. Pt also states that the doc is supposed to be making arrangements to get her C-PAP machine here.  [facility] 07/18/2014, 9:57 PM

## 2014-07-18 NOTE — BHH Group Notes (Signed)
Adult Psychoeducational Group Note  Date:  07/18/2014 Time:  5:10 PM  Group Topic/Focus:  Self Care:   The focus of this group is to help patients understand the importance of self-care in order to improve or restore emotional, physical, spiritual, interpersonal, and financial health.  Participation Level:  Active  Participation Quality:  Appropriate  Affect:  Appropriate  Cognitive:  Alert  Insight: Appropriate  Engagement in Group:  Engaged  Modes of Intervention:  Discussion  Additional Comments:  Pt attended group. Pt stated she enjoyed learning about history and was a daughter of the conferercy. Pt stated her support group consisted of her siamese cat and her friend that told her she needed to come to [facility].  Jill Side, Dazia Lippold G 07/18/2014, 5:10 PM

## 2014-07-18 NOTE — Progress Notes (Signed)
D.  Pt pleasant but anxious on approach.  Concerned about BP that has been running high and also that she has had a past diagnosis of sleep apnea.  Pt states she never wore a C-Pap because she could not tolerate the mask.  She states now she would like to have one if it could be with a nasal cannula.  She would like the doctor to discuss how to go about that with her tomorrow.  Pt was positive for evening wrap up group, and has been interacting appropriately on the unit.  Pt was initially concerned about having a roommate but has gotten on very well with her roommate that arrived today.  Pt said she had not had any panic attacks today, but that her anxiety is worse at night.  She denies SI/HI/hallucinations at this time.  A.  Support and encouragement offered.  Spoke with Heloise Purpura, NP about Pt's BP and Norvasc 5 mg was ordered to start tonight.  Will leave sticky note regarding C-Pap for doctor to see in AM.  R.  PT pleased with new order, anxious to see how her BP is in AM.  Pt sleeping when last checked on with snoring respirations.  Will continue to monitor.

## 2014-07-18 NOTE — Progress Notes (Signed)
Psychoeducational Group Note  Date: 07/18/2014 Time:  0930  Group Topic/Focus:  Gratefulness:  The focus of this group is to help patients identify what two things they are most grateful for in their lives. What helps ground them and to center them on their work to their recovery.  Participation Level:  Active  Participation Quality:  Appropriate  Affect:  Appropriate  Cognitive:  Oriented  Insight:  Improving  Engagement in Group:  Engaged  Additional Comments:  Pt participated in the group  Bryson Dames A

## 2014-07-19 MED ORDER — CLONAZEPAM 0.5 MG PO TABS
0.5000 mg | ORAL_TABLET | Freq: Every morning | ORAL | Status: DC
  Administered 2014-07-20 – 2014-07-26 (×7): 0.5 mg via ORAL
  Filled 2014-07-19 (×5): qty 1
  Filled 2014-07-19: qty 3
  Filled 2014-07-19 (×2): qty 1

## 2014-07-19 MED ORDER — SERTRALINE HCL 100 MG PO TABS
100.0000 mg | ORAL_TABLET | Freq: Every day | ORAL | Status: DC
  Administered 2014-07-20 – 2014-07-21 (×2): 100 mg via ORAL
  Filled 2014-07-19 (×4): qty 1

## 2014-07-19 MED ORDER — CLONAZEPAM 1 MG PO TABS
1.5000 mg | ORAL_TABLET | Freq: Every day | ORAL | Status: DC
  Administered 2014-07-19 – 2014-07-25 (×7): 1.5 mg via ORAL
  Filled 2014-07-19 (×12): qty 1

## 2014-07-19 MED ORDER — MIRTAZAPINE 7.5 MG PO TABS
7.5000 mg | ORAL_TABLET | Freq: Every day | ORAL | Status: DC
  Administered 2014-07-19: 7.5 mg via ORAL
  Filled 2014-07-19 (×4): qty 1
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
## 2014-07-19 NOTE — Progress Notes (Addendum)
Patient ID: Kendra Simmons, female   DOB: 06-Apr-1951, 63 y.o.   MRN: [id number] [facility] MD Progress Note  07/19/2014 4:39 PM Kendra Simmons  MRN:  [id number] Subjective:  Patient states " I'm not doing that well" and continues to report insomnia, only partially improved on Seroquel, which was titrated to 400 mgrs QHS, and continues to report a sense of  " racing thoughts" particularly at night time as well.  Objective : As above, remains ruminative about insomnia and about her perception of racing thoughts. Of note, she is not presenting with any hypomanic symptoms- there is no flight of ideations or thought disorder, no expansiveness or irritability, no grandiosity, no increased goal directed behavior, no psychomotor agitation, no pressured speech.  She is going to groups and behavior on unit is in good control. She tends to be worried and in particular ruminates about insomnia. States she has Sleep Apnea, but did not tolerate the mask well and therefore has not been using it ,  and is hoping that she can adapt to a nasal cannulae /nasal CPAP.     Total Time spent with patient: 25 minutes   AXIS I: Major Depression, Recurrent severe, Anxiety Disorder NOS, consider Adjustment Disorder with Depressed Mood and Anxiety.     ADL's:  Intact  Sleep: improving   Appetite:  Good  Suicidal Ideation:  Denies Homicidal Ideation:  Denies AEB (as evidenced by):  Psychiatric Specialty Exam: Physical Exam  Review of Systems  Constitutional: Negative.   HENT: Negative.   Eyes: Negative.   Respiratory: Negative.   Cardiovascular: Negative.   Gastrointestinal: Negative.   Genitourinary: Negative.   Musculoskeletal: Negative.   Skin: Negative.   Neurological: Negative.   Endo/Heme/Allergies: Negative.   Psychiatric/Behavioral: Positive for depression. The patient is nervous/anxious.     Blood pressure 132/71, pulse 90, temperature 98.1 F (36.7 C), temperature source Oral, resp. rate  18, height 5\' 4"  (1.626 m), weight 81.194 kg (179 lb), SpO2 98.00%.Body mass index is 30.71 kg/(m^2).   General Appearance: Fairly Groomed   Engineer, water:: Good   Speech: Normal Rate   Volume: Normal   Mood: States she still feels depressed   Affect:  Improved mood and affect, still vaguely anxious.   Thought Process:  Ruminative,   Orientation: NA- fully alert And attentive   Thought Content: denies hallucinations, no delusions   Suicidal Thoughts: denies    Homicidal Thoughts: No   Memory: NA   Judgement: Fair   Insight: Fair   Psychomotor Activity: normal  Concentration: Good   Recall: Good   Fund of Knowledge:Good   Language: Good   Akathisia: Negative   Handed: Right   AIMS (if indicated):   Assets: Communication Skills  Desire for Improvement  Resilience   Sleep: Number of Hours: 4.25    Musculoskeletal:  Strength & Muscle Tone: within normal limits  Gait & Station: normal  Patient leans: N/A  Current Medications: Current Facility-Administered Medications  Medication Dose Route Frequency Provider Last Rate Last Dose  . acetaminophen (TYLENOL) tablet 650 mg  650 mg Oral Q6H PRN Waylan Boga, NP   650 mg at 07/19/14 1117  . alum & mag hydroxide-simeth (MAALOX/MYLANTA) 200-200-20 MG/5ML suspension 30 mL  30 mL Oral Q4H PRN Waylan Boga, NP   30 mL at 07/18/14 0236  . amLODipine (NORVASC) tablet 5 mg  5 mg Oral Daily Benjamine Mola, FNP   5 mg at 07/19/14 0802  . clonazePAM (KLONOPIN) tablet 0.5 mg  0.5 mg Oral TID Benjamine Mola, FNP   0.5 mg at 07/19/14 1537  . doxepin (SINEQUAN) capsule 25 mg  25 mg Oral QHS,MR X 1 Benjamine Mola, FNP   25 mg at 07/18/14 2211  . loperamide (IMODIUM) capsule 2 mg  2 mg Oral PRN Waylan Boga, NP   2 mg at 07/15/14 0955  . magnesium hydroxide (MILK OF MAGNESIA) suspension 30 mL  30 mL Oral Daily PRN Waylan Boga, NP      . pantoprazole (PROTONIX) EC tablet 40 mg  40 mg Oral Daily Waylan Boga, NP   40 mg at 07/19/14 0803  . QUEtiapine  (SEROQUEL) tablet 400 mg  400 mg Oral QHS Benjamine Mola, FNP   400 mg at 07/18/14 2211  . sertraline (ZOLOFT) tablet 150 mg  150 mg Oral Daily Neita Garnet, MD   150 mg at 07/19/14 0803  . simvastatin (ZOCOR) tablet 10 mg  10 mg Oral q1800 Waylan Boga, NP   10 mg at 07/18/14 1725  . tamsulosin (FLOMAX) capsule 0.4 mg  0.4 mg Oral Daily Waylan Boga, NP   0.4 mg at 07/19/14 0804  . Vitamin D (Ergocalciferol) (DRISDOL) capsule 50,000 Units  50,000 Units Oral Q Wed Waylan Boga, NP   50,000 Units at 07/14/14 1007    Lab Results:  No results found for this or any previous visit (from the past 48 hour(s)).  Physical Findings: AIMS: Facial and Oral Movements Muscles of Facial Expression: None, normal Lips and Perioral Area: None, normal Jaw: None, normal Tongue: None, normal,Extremity Movements Upper (arms, wrists, hands, fingers): None, normal Lower (legs, knees, ankles, toes): None, normal, Trunk Movements Neck, shoulders, hips: None, normal, Overall Severity Severity of abnormal movements (highest score from questions above): None, normal Incapacitation due to abnormal movements: None, normal Patient's awareness of abnormal movements (rate only patient's report): No Awareness, Dental Status Current problems with teeth and/or dentures?: No Does patient usually wear dentures?: No  CIWA:  CIWA-Ar Total: 1 COWS:  COWS Total Score: 2  Assessment: Although improved compared to admission, she remains ruminative, anxious, and is concerned about ongoing insomnia. Tolerating medications well. Seroquel now at 400 mgr QHS. We discussed options- she does not want to take Doxepin due to concerns about side effects. Agrees to Remeron and Klonopin dose adjustment ( higher dose at night) to help her sleep better.   Treatment Plan Summary: Daily contact with patient to assess and evaluate symptoms and progress in treatment Medication management  Plan:  Continue inpatient care and support,  milieu Change Klonopin to 0.5 mgr QAM  and 1.5  mgr QHS . Continue Seroquel  To  400 mgrs QHS Taper Zoloft to 100  mgrs QAM Start Remeron 7.5 mgrs QHS  D/C Sinequan Will look into respiratory evaluation or referral for CPAP adjustment- patient aware this might need to happen after discharge.       Medical Decision Making Problem Points:  Established problem, stable/improving (1) and Review of last therapy session (1) Data Points:  Review of medication regiment & side effects (2) Review of new medications or change in dosage (2)  I certify that inpatient services furnished can reasonably be expected to improve the patient's condition.   Neita Garnet, MD  07/19/2014, 4:39 PM

## 2014-07-19 NOTE — BHH Group Notes (Signed)
[facility] LCSW Aftercare Discharge Planning Group Note   07/19/2014 10:22 AM    Participation Quality:  Appropraite  Mood/Affect:  Appropriate  Depression Rating:  5  Anxiety Rating:  8  Thoughts of Suicide:  No  Will you contract for safety?   NA  Current AVH:  No  Plan for Discharge/Comments:  Patient attended discharge planning group and actively participated in group.  She advised she was being seen by [facility] prior to admission but wants to be scheduled with [facility] prior to discharge.  CSW provided all participants with daily workbook.   Transportation Means: Patient has transportation.   Supports:  Patient has a support system.   Eliza Green, Eulas Post

## 2014-07-19 NOTE — BHH Group Notes (Signed)
[facility] LCSW Group Therapy          Overcoming Obstacles       1:15 -2:30        07/19/2014       Type of Therapy:  Group Therapy  Participation Level:  Appropriate  Participation Quality:  Appropriate  Affect:  Appropriate, Alert  Cognitive:  Attentive Appropriate  Insight: Developing/Improving Engaged  Engagement in Therapy: Developing/Imprvoing Engaged  Modes of Intervention:  Discussion Exploration  Education Rapport BuildingProblem-Solving Support  Summary of Progress/Problems:  The main focus of today's group was overcoming obstacles.  She advise the obstacle she has to overcome is sleep and other health problems.         Patient able to identify appropriate coping skills.   [name] 07/19/2014

## 2014-07-19 NOTE — Progress Notes (Signed)
D:  Patient's self inventory sheet, patient has poor sleep, took sleep medication which helped her sleep 3 hours which was good for her.  Fair appetitek low energy level, good concentration.  Rated depression and hopeless5, anxiety #8.  Denied withdrawals.  Denied SI.  Physical problems sometimes, alittle dizzy.   Has experienced pain in past 24 hours, worst pain #3, neck shoulders, lower back.  Pain medications are helpful.  Goals, getting MD to get respiratory in here, test for sleep apnea.  Was tested 9 yrs ago, will need to get that on board.  Speak with MD about sleep issues, anxiety, panic, racing thoughts.  Respiratiory needs to see her today.  No discharge plans.  No problems taking medications after discharge. A:  Medications administered per MD orders.  Emotional support and encouragement given patient. R:  Deneid SI and HI.  Denied A/V hallucinations.  Safety maintained with 15 minute checks.  Order for spiritual consult placed per patient's request to have Rodman Key talk and pray with her.

## 2014-07-19 NOTE — Progress Notes (Signed)
Patient ID: Kendra Simmons, female   DOB: 16-Dec-1951, 63 y.o.   MRN: [id number] PER STATE REGULATIONS 482.30  THIS CHART WAS REVIEWED FOR MEDICAL NECESSITY WITH RESPECT TO THE PATIENT'S ADMISSION/ DURATION OF STAY.  NEXT REVIEW DATE: 07/23/2014  Chauncy Lean, RN, BSN CASE MANAGER

## 2014-07-20 LAB — LIPID PANEL
Cholesterol: 162 mg/dL (ref 0–200)
HDL: 65 mg/dL (ref >39)
LDL CALC: 71 mg/dL (ref 0–99)
Total CHOL/HDL Ratio: 2.5 RATIO
Triglycerides: 132 mg/dL (ref <150)
VLDL: 26 mg/dL (ref 0–40)

## 2014-07-20 MED ORDER — AMLODIPINE BESYLATE 2.5 MG PO TABS
2.5000 mg | ORAL_TABLET | Freq: Every day | ORAL | Status: DC
  Administered 2014-07-21 – 2014-07-26 (×6): 2.5 mg via ORAL
  Filled 2014-07-20 (×2): qty 1
  Filled 2014-07-20: qty 4
  Filled 2014-07-20 (×6): qty 1

## 2014-07-20 MED ORDER — ZINC OXIDE 40 % EX OINT
TOPICAL_OINTMENT | Freq: Every day | CUTANEOUS | Status: DC
  Administered 2014-07-21 – 2014-07-26 (×4): via TOPICAL
  Filled 2014-07-20: qty 114

## 2014-07-20 MED ORDER — MIRTAZAPINE 15 MG PO TABS
15.0000 mg | ORAL_TABLET | Freq: Every day | ORAL | Status: DC
  Administered 2014-07-20: 15 mg via ORAL
  Filled 2014-07-20 (×4): qty 1

## 2014-07-20 MED ORDER — QUETIAPINE FUMARATE 200 MG PO TABS
200.0000 mg | ORAL_TABLET | Freq: Every day | ORAL | Status: DC
  Administered 2014-07-20 – 2014-07-21 (×2): 200 mg via ORAL
  Filled 2014-07-20 (×5): qty 1

## 2014-07-20 NOTE — Tx Team (Signed)
Interdisciplinary Treatment Plan Update   Date Reviewed:  07/20/2014  Time Reviewed:  9:54 AM  Progress in Treatment:   Attending groups: Yes Participating in groups: Yes Taking medication as prescribed: Yes  Tolerating medication: Yes Family/Significant other contact made:  Collateral contact made with family Patient understands diagnosis: Yes  Discussing patient identified problems/goals with staff: Yes Medical problems stabilized or resolved: Yes Denies suicidal/homicidal ideation: Yes Patient has not harmed self or others: Yes  For review of initial/current patient goals, please see plan of care.  Estimated Length of Stay:  2-4 days  Reasons for Continued Hospitalization:  Anxiety Depression Medication stabilization  New Problems/Goals identified:    Discharge Plan or Barriers:   Home with outpatient follow up to be determined  Additional Comments:   Continue medication stabilization  Attendees:  Patient:  07/20/2014 9:54 AM   Signature:  Gabriel Earing, MD 07/20/2014 9:54 AM  Signature:  07/20/2014 9:54 AM  Signature:   07/20/2014 9:54 AM  Signature:Beverly Danelle Earthly, RN 07/20/2014 9:54 AM  Signature:  Thurnell Garbe RN 07/20/2014 9:54 AM  Signature:  Joette Catching, LCSW 07/20/2014 9:54 AM  Signature:   07/20/2014 9:54 AM  Signature:  Lucinda Dell, Care Coordinator [facility] 07/20/2014 9:54 AM  [place] 07/20/2014 9:54 AM  Signature: 07/20/2014  9:54 AM  Signature:   Lars Pinks, RN [facility] 07/20/2014  9:54 AM  Signature: 07/20/2014  9:54 AM    Scribe for Treatment Team:   Joette Catching,  07/20/2014 9:54 AM

## 2014-07-20 NOTE — Progress Notes (Signed)
D:  Patient's self inventory sheet, patient sleeps fair, did not take sleep medication.   Fair appetite, low energy level, improved concentration.  Rated depression 5, hopeless 3, anxiety 10.  Denied withdrawals.  Denied SI.  Has sweaty feet, increased pulse.  Worst pain #2, pain in neck, lower back, takes tylenol which is helpful.  Goal today is work on Radiographer, therapeutic.  Will try to get the anxiety down so pulse will go down.  Slept 5 hours last night.  No discharge plans.  Plans to get under control, BP good this morning, thank you God. A:  Medications administered per MD orders.  Emotional support and encouragement given patient. R:  Denied SI and HI, contracts for safety.   Denied A/V hallucinations.  Safety maintained with 15 minute checks.  Patient feels remeron is helpful for her sleep.

## 2014-07-20 NOTE — Progress Notes (Signed)
D: Patient denies SI/HI/AVH. Patient rates hopelessness as 8,  depression as 5, and anxiety as 10.  Patient affect is anxious. Mood is anxious.  Pt states, "My day has been bad because I haven't been able to get any sleep.  I spoke with the Doctor and he said that he's almost given me all that he can.  I'm worried that nothing will help and I won't be able to get any sleep."  Patient did attend evening group. Patient visible on the milieu. No distress noted. A: Support and encouragement offered. Scheduled medications given to pt. Q 15 min checks continued for patient safety. R: Patient receptive. Patient remains safe on the unit.

## 2014-07-20 NOTE — Progress Notes (Signed)
Patient ID: Kendra Simmons, female   DOB: [birth year], 63 y.o.   MRN: [id number] [facility] MD Progress Note  07/20/2014 5:40 PM RONITA HARGREAVES  MRN:  [id number] Subjective:  She reports that she is sleeping a little better. She still feels she is thinking " too much", particularly regarding her stressors.  Objective : I have discussed case with treatment team. Patient is going to groups and is participating in them. Visible in milieu.  She continues to describe anxiety, and pulse has been  elevated, which she attributes to anxiety, at least in part, but may also be related to Seroquel and (  recently started)  Norvasc. She does have some orthostasis, but denies any dizziness , gait is steady.  Reviewed lipid profile-within normal limits. Remains anxious, with a tendency to ruminate.    Total Time spent with patient: 25 minutes   AXIS I: Major Depression, Recurrent severe, Anxiety Disorder NOS, consider Adjustment Disorder with Depressed Mood and Anxiety.     ADL's:  Intact  Sleep: improving   Appetite:  Good  Suicidal Ideation:  Denies Homicidal Ideation:  Denies AEB (as evidenced by):  Psychiatric Specialty Exam: Physical Exam  Review of Systems  Constitutional: Negative.   HENT: Negative.   Eyes: Negative.   Respiratory: Negative.   Cardiovascular: Negative.   Gastrointestinal: Negative.   Genitourinary: Negative.   Musculoskeletal: Negative.   Skin: Negative.   Neurological: Negative.   Endo/Heme/Allergies: Negative.   Psychiatric/Behavioral: Positive for depression. The patient is nervous/anxious.     Blood pressure 125/79, pulse 108, temperature 98 F (36.7 C), temperature source Oral, resp. rate 16, height 5\' 4"  (1.626 m), weight 81.194 kg (179 lb), SpO2 98.00%.Body mass index is 30.71 kg/(m^2).   General Appearance: Fairly Groomed   Engineer, water:: Good   Speech: Normal Rate   Volume: Normal   Mood: Mood slowly improving   Affect: Still anxious  Thought  Process:  Ruminative, and tends to be somatically focused   Orientation: NA- fully alert And attentive   Thought Content: denies hallucinations, no delusions   Suicidal Thoughts: No - contracts for safety on the unit.    Homicidal Thoughts: No   Memory: NA   Judgement: Fair   Insight: Fair   Psychomotor Activity: normal  Concentration: Good   Recall: Good   Fund of Knowledge:Good   Language: Good   Akathisia: Negative   Handed: Right   AIMS (if indicated):   Assets: Communication Skills  Desire for Improvement  Resilience   Sleep: Number of Hours: 4.25    Musculoskeletal:  Strength & Muscle Tone: within normal limits  Gait & Station: normal  Patient leans: N/A  Current Medications: Current Facility-Administered Medications  Medication Dose Route Frequency Provider Last Rate Last Dose  . acetaminophen (TYLENOL) tablet 650 mg  650 mg Oral Q6H PRN Waylan Boga, NP   650 mg at 07/19/14 1117  . alum & mag hydroxide-simeth (MAALOX/MYLANTA) 200-200-20 MG/5ML suspension 30 mL  30 mL Oral Q4H PRN Waylan Boga, NP   30 mL at 07/19/14 2253  . amLODipine (NORVASC) tablet 5 mg  5 mg Oral Daily Benjamine Mola, FNP   5 mg at 07/20/14 7915  . clonazePAM (KLONOPIN) tablet 0.5 mg  0.5 mg Oral q morning - 10a Neita Garnet, MD   0.5 mg at 07/20/14 1128  . clonazePAM (KLONOPIN) tablet 1.5 mg  1.5 mg Oral QHS Neita Garnet, MD   1.5 mg at 07/19/14 2239  . loperamide (IMODIUM) capsule  2 mg  2 mg Oral PRN Waylan Boga, NP   2 mg at 07/19/14 2336  . magnesium hydroxide (MILK OF MAGNESIA) suspension 30 mL  30 mL Oral Daily PRN Waylan Boga, NP      . mirtazapine (REMERON) tablet 7.5 mg  7.5 mg Oral QHS Neita Garnet, MD   7.5 mg at 07/19/14 2240  . pantoprazole (PROTONIX) EC tablet 40 mg  40 mg Oral Daily Waylan Boga, NP   40 mg at 07/20/14 0813  . QUEtiapine (SEROQUEL) tablet 400 mg  400 mg Oral QHS Benjamine Mola, FNP   400 mg at 07/19/14 2239  . sertraline (ZOLOFT) tablet 100 mg  100 mg Oral  Daily Neita Garnet, MD   100 mg at 07/20/14 0813  . simvastatin (ZOCOR) tablet 10 mg  10 mg Oral q1800 Waylan Boga, NP   10 mg at 07/20/14 1731  . tamsulosin (FLOMAX) capsule 0.4 mg  0.4 mg Oral Daily Waylan Boga, NP   0.4 mg at 07/20/14 0814  . Vitamin D (Ergocalciferol) (DRISDOL) capsule 50,000 Units  50,000 Units Oral Q Wed Waylan Boga, NP   50,000 Units at 07/14/14 1007    Lab Results:  Results for orders placed during the hospital encounter of 07/11/14 (from the past 48 hour(s))  LIPID PANEL     Status: None   Collection Time    07/20/14  6:30 AM      Result Value Ref Range   Cholesterol 162  0 - 200 mg/dL   Triglycerides 132  <150 mg/dL   HDL 65  >39 mg/dL   Total CHOL/HDL Ratio 2.5     VLDL 26  0 - 40 mg/dL   LDL Cholesterol 71  0 - 99 mg/dL   Comment:            Total Cholesterol/HDL:CHD Risk     Coronary Heart Disease Risk Table                         Men   Women      1/2 Average Risk   3.4   3.3      Average Risk       5.0   4.4      2 X Average Risk   9.6   7.1      3 X Average Risk  23.4   11.0                Use the calculated Patient Ratio     above and the CHD Risk Table     to determine the patient's CHD Risk.                ATP III CLASSIFICATION (LDL):      <100     mg/dL   Optimal      100-129  mg/dL   Near or Above                        Optimal      130-159  mg/dL   Borderline      160-189  mg/dL   High      >190     mg/dL   Very High     Performed at [facility]    Physical Findings: AIMS: Facial and Oral Movements Muscles of Facial Expression: None, normal Lips and Perioral Area: None, normal Jaw: None, normal Tongue: None, normal,Extremity Movements Upper (  arms, wrists, hands, fingers): None, normal Lower (legs, knees, ankles, toes): None, normal, Trunk Movements Neck, shoulders, hips: None, normal, Overall Severity Severity of abnormal movements (highest score from questions above): None, normal Incapacitation due to abnormal  movements: None, normal Patient's awareness of abnormal movements (rate only patient's report): No Awareness, Dental Status Current problems with teeth and/or dentures?: No Does patient usually wear dentures?: No  CIWA:  CIWA-Ar Total: 2 COWS:  COWS Total Score: 4  Assessment: Patient is improving slowly. Her depression is better. Her sleep is slowly improving, but she is still anxious, ruminative, and today is more somatic. She is having some palpitations/tachycardia and orthostasis.     Treatment Plan Summary: Daily contact with patient to assess and evaluate symptoms and progress in treatment Medication management  Plan:  Continue inpatient care and support, milieu Change Klonopin to 0.5 mgr QAM  and 1.5  mgr QHS . Decrease Norvasc to 2.5 mgrs QDAY to reduce orthostasis  Decrease  Seroquel  To  200 mgrs QHS for same reason.  Zoloft to 100  mgrs QAM Remeron 7.5 mgrs QHS   Will look into respiratory evaluation or referral for CPAP adjustment- patient aware this might need to happen after discharge.       Medical Decision Making Problem Points:  Established problem, stable/improving (1) and Review of last therapy session (1) Data Points:  Review or order clinical lab tests (1) Review of medication regiment & side effects (2) Review of new medications or change in dosage (2)  I certify that inpatient services furnished can reasonably be expected to improve the patient's condition.   Neita Garnet, MD  07/20/2014, 5:40 PM

## 2014-07-20 NOTE — Progress Notes (Signed)
Adult Psychoeducational Group Note  Date:  07/20/2014 Time:  11:40 PM  Group Topic/Focus:  Wrap-Up Group:   The focus of this group is to help patients review their daily goal of treatment and discuss progress on daily workbooks.  Participation Level:  Active  Participation Quality:  Appropriate  Affect:  Appropriate  Cognitive:  Appropriate  Insight: Appropriate  Engagement in Group:  Engaged  Modes of Intervention:  Discussion  Additional Comments:  Did not meet goal today felt like she couldn't get what she needed out of group due to something but refused to talk to the staff about what prevented her to ensure that it doesn't continue to happen.  Mady Gemma Latrice 07/20/2014, 11:40 PM

## 2014-07-20 NOTE — Progress Notes (Signed)
Recreation Therapy Notes  Animal-Assisted Activity/Therapy (AAA/T) Program Checklist/Progress Notes Patient Eligibility Criteria Checklist & Daily Group note for Rec Tx Intervention  Date: 08.04.2015 Time: 3:15pm Location: [address]   AAA/T Program Assumption of Risk Form signed by Patient/ or Parent Legal Guardian yes  Patient is free of allergies or sever asthma yes  Patient reports no fear of animals yes  Patient reports no history of cruelty to animals yes   Patient understands his/her participation is voluntary yes  Patient washes hands before animal contact yes  Patient washes hands after animal contact yes  Behavioral Response: Engaged, Appropriate   Education: Hand Washing, Appropriate Animal Interaction   Education Outcome: Acknowledges understanding  Clinical Observations/Feedback: Patient interacted appropriately with therapy dog team and peers in session. Patient asked appropriate questions about therapy dog and his training, as well as shared stories about pets she has had in the past.   Lane Hacker, LRT/CTRS        Lane Hacker 07/20/2014 4:16 PM

## 2014-07-20 NOTE — BHH Group Notes (Signed)
[facility] LCSW Group Therapy      Feelings About Diagnosis 1:15 - 2:30 PM         07/20/2014    Type of Therapy:  Group Therapy  Participation Level:  Active  Participation Quality:  Appropriate  Affect:  Appropriate  Cognitive:  Alert and Appropriate  Insight:  Developing/Improving and Engaged  Engagement in Therapy:  Developing/Improving and Engaged  Modes of Intervention:  Discussion, Education, Exploration, Problem-Solving, Rapport Building, Support  Summary of Progress/Problems:  Patient actively participated in group. Patient discussed past and present diagnosis and the effects it has had on  life.  Patient talked about family and society being judgmental and the stigma associated with having a mental health diagnosis.  Patient shared being depression is like being in  [place] she is unable to get out of.  She shared she if working hard to get better and wants to follow up with [facility].  [name] 07/20/2014

## 2014-07-20 NOTE — Progress Notes (Addendum)
The focus of this group is to educate the patient on the purpose and policies of crisis stabilization and provide a format to answer questions about their admission.  The group details unit policies and expectations of patients while admitted.  Patient attended 0900 nurse education orientation group this morning.  Patient actively participated, appropriate affect, alert, appropriate insight and engagement.  Today patient will work on 3 goals for discharge.  

## 2014-07-21 MED ORDER — SERTRALINE HCL 100 MG PO TABS
100.0000 mg | ORAL_TABLET | Freq: Every day | ORAL | Status: DC
  Administered 2014-07-22 – 2014-07-26 (×5): 100 mg via ORAL
  Filled 2014-07-21 (×6): qty 1
  Filled 2014-07-21: qty 4
  Filled 2014-07-21: qty 1

## 2014-07-21 MED ORDER — SERTRALINE HCL 50 MG PO TABS: 50.0000 mg | ORAL_TABLET | Freq: Every day | ORAL | Status: DC

## 2014-07-21 MED ORDER — MIRTAZAPINE 30 MG PO TABS
30.0000 mg | ORAL_TABLET | Freq: Every day | ORAL | Status: DC
  Administered 2014-07-21 – 2014-07-22 (×2): 30 mg via ORAL
  Filled 2014-07-21: qty 2
  Filled 2014-07-21 (×5): qty 1

## 2014-07-21 NOTE — Progress Notes (Signed)
Adult Psychoeducational Group Note  Date:  07/21/2014 Time:  10:00am Group Topic/Focus:  Personal Choices and Values:   The focus of this group is to help patients assess and explore the importance of values in their lives, how their values affect their decisions, how they express their values and what opposes their expression.  Participation Level:  Active  Participation Quality:  Appropriate and Attentive  Affect:  Appropriate  Cognitive:  Alert and Appropriate  Insight: Appropriate  Engagement in Group:  Engaged  Modes of Intervention:  Discussion and Education  Additional Comments:  Pt attended and participated in group. Discussion was on personal development and the question was asked What does personal development mean to you? Pt stated it means trying to move on.  Marlowe Shores D 07/21/2014, 6:53 PM

## 2014-07-21 NOTE — Progress Notes (Signed)
D: Patient appropriate and cooperative with staff. Patient's has flat, depressed affect and mood. She reported on the self inventory sheet that sleep, appetite and ability to concentrate are fair and energy level is low. Patient rates depression "5", feelings of hopelessness "7" and anxiety "10". She's attending group sessions and visible in the milieu. Patient adheres to medication regimen.  A: Support and encouragement provided to patient. Administered scheduled medications per ordering MD. Monitor Q15 minute checks for safety.  R: Patient receptive. Denies SI/HI and AVH. Patient remains safe on the unit.

## 2014-07-21 NOTE — Progress Notes (Signed)
D: Patient in the hallway on approach.  Patient appears anxious and nervous all the time.  Patient fixated on anxiety medications and states she is unsure if klonopin is helping her.  Patient denies SI/HI and denies AVH.  Patient still states she is depressed and anxious. A: Staff to monitor Q 15 mins for safety.  Encouragement and support offered.  Scheduled medications administered per orders. R: Patient remains safe on the unit.  Patient attended group tonight.  Patient taking administered medications.  Patient visible on the unit and interacting with peers.

## 2014-07-21 NOTE — Progress Notes (Signed)
Follow up support and prayers with pt.

## 2014-07-21 NOTE — Progress Notes (Signed)
D: Pt denies SI/HI/AVH. Pt is pleasant and cooperative. Pt complained of sweating on her feet and abdomen area.   A: Pt was offered support and encouragement. Pt was given scheduled medications. Pt was encourage to attend groups. Q 15 minute checks were done for safety.   R:Pt attends groups and interacts well with peers and staff. Pt is taking medication. Pt has no complaints at this time.Pt receptive to treatment and safety maintained on unit.

## 2014-07-21 NOTE — BHH Group Notes (Signed)
[facility] LCSW Aftercare Discharge Planning Group Note   07/21/2014 1:22 PM    Participation Quality:  Appropraite  Mood/Affect:  Appropriate  Depression Rating:  5  Anxiety Rating:  8  Thoughts of Suicide:  No  Will you contract for safety?   NA  Current AVH:  No  Plan for Discharge/Comments:  Patient attended discharge planning group and actively participated in group.  She will follow up with [facility].  CSW provided all participants with daily workbook.   Transportation Means: Patient has transportation.   Supports:  Patient has a support system.   Dallie Patton, Eulas Post

## 2014-07-21 NOTE — Progress Notes (Signed)
Patient ID: Kendra Simmons, female   DOB: 22-Aug-1951, 63 y.o.   MRN: [id number] [facility] MD Progress Note  07/21/2014 4:27 PM Kendra Simmons  MRN:  [id number] Subjective:  Although still reporting anxiety, she is reporting improvement compared to initial presentation and she states she is sleeping better. Objective : Patient has been ruminative about issues with her family, elderly mother, and has tended to be somatically focused. Although these are still present , she does seem improved today, and appears less focused on these ruminations at this time. She states she slept better last night- " about 5 hours" and last night did not have nightmares. At this time is not endorsing medication side effects- she does have some persistent tachycardia, but less than yesterday. She has a pulse of 110. She has no associated symptoms. She is starting to be more future oriented, and we were able  to discuss disposition/treatment options for after discharge. She expresses interest in IOP level of care, if possible. We reviewed medication side effects.    Total Time spent with patient: 25 minutes   AXIS I: Major Depression, Recurrent severe, Anxiety Disorder NOS, consider Adjustment Disorder with Depressed Mood and Anxiety.     ADL's:  Intact  Sleep: improving   Appetite:  Good  Suicidal Ideation:  Denies Homicidal Ideation:  Denies AEB (as evidenced by):  Psychiatric Specialty Exam: Physical Exam  Review of Systems  Constitutional: Negative.   HENT: Negative.   Eyes: Negative.   Respiratory: Negative.   Cardiovascular: Negative.   Gastrointestinal: Negative.   Genitourinary: Negative.   Musculoskeletal: Negative.   Skin: Negative.   Neurological: Negative.   Endo/Heme/Allergies: Negative.   Psychiatric/Behavioral: Positive for depression. The patient is nervous/anxious.     Blood pressure 121/75, pulse 113, temperature 97.5 F (36.4 C), temperature source Oral, resp. rate 18,  height 5\' 4"  (1.626 m), weight 81.194 kg (179 lb), SpO2 98.00%.Body mass index is 30.71 kg/(m^2).   General Appearance: improved grooming   Eye Contact:: Good   Speech: Normal Rate   Volume: Normal   Mood: Mood slowly improving   Affect: Anxiety slowly improving, and more reactive affect  Thought Process:  Ruminations have decreased.  Orientation: NA- fully alert And attentive   Thought Content: denies hallucinations, no delusions  Expressed   Suicidal Thoughts: No , denies any current suicidal plan or intent - contracts for safety on the unit.    Homicidal Thoughts: No   Memory: NA   Judgement: Fair   Insight: Fair   Psychomotor Activity: normal  Concentration: Good   Recall: Good   Fund of Knowledge:Good   Language: Good   Akathisia: Negative   Handed: Right   AIMS (if indicated):   Assets: Communication Skills  Desire for Improvement  Resilience   Sleep: Number of Hours: 4.25    Musculoskeletal:  Strength & Muscle Tone: within normal limits  Gait & Station: normal  Patient leans: N/A  Current Medications: Current Facility-Administered Medications  Medication Dose Route Frequency Provider Last Rate Last Dose  . acetaminophen (TYLENOL) tablet 650 mg  650 mg Oral Q6H PRN Waylan Boga, NP   650 mg at 07/21/14 0817  . alum & mag hydroxide-simeth (MAALOX/MYLANTA) 200-200-20 MG/5ML suspension 30 mL  30 mL Oral Q4H PRN Waylan Boga, NP   30 mL at 07/19/14 2253  . amLODipine (NORVASC) tablet 2.5 mg  2.5 mg Oral Daily Neita Garnet, MD   2.5 mg at 07/21/14 0804  . clonazePAM (KLONOPIN) tablet 0.5  mg  0.5 mg Oral q morning - 10a Neita Garnet, MD   0.5 mg at 07/21/14 1011  . clonazePAM (KLONOPIN) tablet 1.5 mg  1.5 mg Oral QHS Neita Garnet, MD   1.5 mg at 07/20/14 2215  . liver oil-zinc oxide (DESITIN) 40 % ointment   Topical Daily Laverle Hobby, PA-C      . loperamide (IMODIUM) capsule 2 mg  2 mg Oral PRN Waylan Boga, NP   2 mg at 07/19/14 2336  . magnesium hydroxide (MILK  OF MAGNESIA) suspension 30 mL  30 mL Oral Daily PRN Waylan Boga, NP      . mirtazapine (REMERON) tablet 15 mg  15 mg Oral QHS Neita Garnet, MD   15 mg at 07/20/14 2215  . pantoprazole (PROTONIX) EC tablet 40 mg  40 mg Oral Daily Waylan Boga, NP   40 mg at 07/21/14 0804  . QUEtiapine (SEROQUEL) tablet 200 mg  200 mg Oral QHS Neita Garnet, MD   200 mg at 07/20/14 2216  . sertraline (ZOLOFT) tablet 100 mg  100 mg Oral Daily Neita Garnet, MD   100 mg at 07/21/14 0804  . simvastatin (ZOCOR) tablet 10 mg  10 mg Oral q1800 Waylan Boga, NP   10 mg at 07/20/14 1731  . tamsulosin (FLOMAX) capsule 0.4 mg  0.4 mg Oral Daily Waylan Boga, NP   0.4 mg at 07/21/14 0804  . Vitamin D (Ergocalciferol) (DRISDOL) capsule 50,000 Units  50,000 Units Oral Q Wed Waylan Boga, NP   50,000 Units at 07/21/14 1011    Lab Results:  Results for orders placed during the hospital encounter of 07/11/14 (from the past 48 hour(s))  LIPID PANEL     Status: None   Collection Time    07/20/14  6:30 AM      Result Value Ref Range   Cholesterol 162  0 - 200 mg/dL   Triglycerides 132  <150 mg/dL   HDL 65  >39 mg/dL   Total CHOL/HDL Ratio 2.5     VLDL 26  0 - 40 mg/dL   LDL Cholesterol 71  0 - 99 mg/dL   Comment:            Total Cholesterol/HDL:CHD Risk     Coronary Heart Disease Risk Table                         Men   Women      1/2 Average Risk   3.4   3.3      Average Risk       5.0   4.4      2 X Average Risk   9.6   7.1      3 X Average Risk  23.4   11.0                Use the calculated Patient Ratio     above and the CHD Risk Table     to determine the patient's CHD Risk.                ATP III CLASSIFICATION (LDL):      <100     mg/dL   Optimal      100-129  mg/dL   Near or Above                        Optimal      130-159  mg/dL  Borderline      160-189  mg/dL   High      >190     mg/dL   Very High     Performed at [facility]    Physical Findings: AIMS: Facial and Oral  Movements Muscles of Facial Expression: None, normal Lips and Perioral Area: None, normal Jaw: None, normal Tongue: None, normal,Extremity Movements Upper (arms, wrists, hands, fingers): None, normal Lower (legs, knees, ankles, toes): None, normal, Trunk Movements Neck, shoulders, hips: None, normal, Overall Severity Severity of abnormal movements (highest score from questions above): None, normal Incapacitation due to abnormal movements: None, normal Patient's awareness of abnormal movements (rate only patient's report): No Awareness, Dental Status Current problems with teeth and/or dentures?: No Does patient usually wear dentures?: No  CIWA:  CIWA-Ar Total: 2 COWS:  COWS Total Score: 4  Assessment: At this time patient is improving gradually- she is less ruminative, less anxious, less somatic, and starting to be able to be more future oriented. Tolerating medications well, although tachycardia persists. We discussed, and patient is aware this could be related to Seroquel, but  states that she wants to continue this medication at this time as she feels it is helping.  Thus far she is tolerating Remeron trial well.      Treatment Plan Summary: Daily contact with patient to assess and evaluate symptoms and progress in treatment Medication management  Plan:  Continue inpatient care and support, milieu  Klonopin to 0.5 mgr QAM  and 1.5  mgr QHS . Norvasc to 2.5 mgrs QDAY    Seroquel  To  200 mgrs QHS  Zoloft to 100  mgrs QAM Remeron now at 30 mgrs QHS Treatment Team/SW to look into disposition options. Will order EKG . ( Routine)        Medical Decision Making Problem Points:  Established problem, stable/improving (1) and Review of last therapy session (1) Data Points:  Review or order clinical lab tests (1) Review of medication regiment & side effects (2) Review of new medications or change in dosage (2)  I certify that inpatient services furnished can reasonably be  expected to improve the patient's condition.   Neita Garnet, MD  07/21/2014, 4:27 PM

## 2014-07-21 NOTE — Progress Notes (Signed)
Adult Psychoeducational Group Note  Date:  07/21/2014 Time:  11:08 PM  Group Topic/Focus:  Wrap-Up Group:   The focus of this group is to help patients review their daily goal of treatment and discuss progress on daily workbooks.  Participation Level:  Active  Participation Quality:  Appropriate  Affect:  Appropriate  Cognitive:  Appropriate  Insight: Appropriate  Engagement in Group:  Engaged  Modes of Intervention:  Support  Additional Comments: pt seemed to be in better spirits today and not as worrisome about her ailments. Pt states that positive thing that happened is that she was able to meet with the minister and he prayed with her for a long time. Her goal is to tlk to her doctor tomorrow about her complications and tonight to get some sleep   Kendra Simmons 07/21/2014, 11:08 PM

## 2014-07-22 MED ORDER — ARIPIPRAZOLE 5 MG PO TABS
5.0000 mg | ORAL_TABLET | Freq: Every day | ORAL | Status: DC
  Administered 2014-07-22 – 2014-07-23 (×2): 5 mg via ORAL
  Filled 2014-07-22 (×5): qty 1

## 2014-07-22 NOTE — Progress Notes (Signed)
Patient ID: Kendra Simmons, female   DOB: 11-Mar-1951, 63 y.o.   MRN: [id number] [facility] MD Progress Note  07/22/2014 2:38 PM SULAY BRYMER  MRN:  [id number] Subjective:  Concerned about ongoing tachycardia Objective : Overall she has been doing better compared to admission and she reports improved sleep- yestreday slept almost 7 hours- and her mood/affect are clearly improved. She does remain anxious , and tends to worry, particularly about her health at this time. She has been presenting with sustained tachycardia - today up to 120, and has had orthostatic BP changes. Because of this we have tapered the Seroquel down, but symptoms remain. I suspect that Seroquel may be contributing to orhtostatic hypotension and palpitations. We discussed this, and agreed to stop Seroquel at this time. It should be noted that Seroquel did seem to be helping and that she has been on this medication in the past. We reviewed other options and she has been on Abilify( with Seroquel) in the past, and remembers this combination as helpful. She is going to groups, and visible in milieu, pleasant with staff and peers.    Total Time spent with patient: 25 minutes   AXIS I: Major Depression, Recurrent severe, Anxiety Disorder NOS, consider Adjustment Disorder with Depressed Mood and Anxiety.     ADL's:  Intact  Sleep: improving   Appetite:  Good  Suicidal Ideation:  Denies Homicidal Ideation:  Denies AEB (as evidenced by):  Psychiatric Specialty Exam: Physical Exam  Review of Systems  Constitutional: Negative.   HENT: Negative.   Eyes: Negative.   Respiratory: Negative.   Cardiovascular: Negative.   Gastrointestinal: Negative.   Genitourinary: Negative.   Musculoskeletal: Negative.   Skin: Negative.   Neurological: Negative.   Endo/Heme/Allergies: Negative.   Psychiatric/Behavioral: Positive for depression. The patient is nervous/anxious.     Blood pressure 133/97, pulse 100, temperature  98.1 F (36.7 C), temperature source Oral, resp. rate 20, height 5\' 4"  (1.626 m), weight 81.194 kg (179 lb), SpO2 98.00%.Body mass index is 30.71 kg/(m^2).   General Appearance: improved grooming   Eye Contact:: Good   Speech: Normal Rate   Volume: Normal   Mood: Mood improved   Affect: Fuller in range, but still anxious  Thought Process: Linear, still ruminates  Orientation: NA- fully alert And attentive   Thought Content: denies hallucinations, no delusions    Suicidal Thoughts: No , denies any current suicidal plan or intent - contracts for safety on the unit.    Homicidal Thoughts: No   Memory: NA   Judgement: Fair   Insight: Fair   Psychomotor Activity: normal  Concentration: Good   Recall: Good   Fund of Knowledge:Good   Language: Good   Akathisia: Negative   Handed: Right   AIMS (if indicated):   Assets: Communication Skills  Desire for Improvement  Resilience   Sleep: Number of Hours: 4.25    Musculoskeletal:  Strength & Muscle Tone: within normal limits  Gait & Station: normal  Patient leans: N/A  Current Medications: Current Facility-Administered Medications  Medication Dose Route Frequency Provider Last Rate Last Dose  . acetaminophen (TYLENOL) tablet 650 mg  650 mg Oral Q6H PRN Waylan Boga, NP   650 mg at 07/22/14 0826  . alum & mag hydroxide-simeth (MAALOX/MYLANTA) 200-200-20 MG/5ML suspension 30 mL  30 mL Oral Q4H PRN Waylan Boga, NP   30 mL at 07/19/14 2253  . amLODipine (NORVASC) tablet 2.5 mg  2.5 mg Oral Daily Neita Garnet, MD   2.5  mg at 07/22/14 0817  . ARIPiprazole (ABILIFY) tablet 5 mg  5 mg Oral Daily Neita Garnet, MD      . clonazePAM [facility]) tablet 0.5 mg  0.5 mg Oral q morning - 10a Neita Garnet, MD   0.5 mg at 07/22/14 1032  . clonazePAM (KLONOPIN) tablet 1.5 mg  1.5 mg Oral QHS Neita Garnet, MD   1.5 mg at 07/21/14 2222  . liver oil-zinc oxide (DESITIN) 40 % ointment   Topical Daily Laverle Hobby, PA-C      . loperamide (IMODIUM)  capsule 2 mg  2 mg Oral PRN Waylan Boga, NP   2 mg at 07/19/14 2336  . magnesium hydroxide (MILK OF MAGNESIA) suspension 30 mL  30 mL Oral Daily PRN Waylan Boga, NP      . mirtazapine (REMERON) tablet 30 mg  30 mg Oral QHS Neita Garnet, MD   30 mg at 07/21/14 2223  . pantoprazole (PROTONIX) EC tablet 40 mg  40 mg Oral Daily Waylan Boga, NP   40 mg at 07/22/14 4562  . sertraline (ZOLOFT) tablet 100 mg  100 mg Oral Daily Neita Garnet, MD   100 mg at 07/22/14 0820  . simvastatin (ZOCOR) tablet 10 mg  10 mg Oral q1800 Waylan Boga, NP   10 mg at 07/21/14 1706  . tamsulosin (FLOMAX) capsule 0.4 mg  0.4 mg Oral Daily Waylan Boga, NP   0.4 mg at 07/22/14 0819  . Vitamin D (Ergocalciferol) (DRISDOL) capsule 50,000 Units  50,000 Units Oral Q Wed Waylan Boga, NP   50,000 Units at 07/21/14 1011    Lab Results:  No results found for this or any previous visit (from the past 48 hour(s)).  Physical Findings: AIMS: Facial and Oral Movements Muscles of Facial Expression: None, normal Lips and Perioral Area: None, normal Jaw: None, normal Tongue: None, normal,Extremity Movements Upper (arms, wrists, hands, fingers): None, normal Lower (legs, knees, ankles, toes): None, normal, Trunk Movements Neck, shoulders, hips: None, normal, Overall Severity Severity of abnormal movements (highest score from questions above): None, normal Incapacitation due to abnormal movements: None, normal Patient's awareness of abnormal movements (rate only patient's report): No Awareness, Dental Status Current problems with teeth and/or dentures?: No Does patient usually wear dentures?: No  CIWA:  CIWA-Ar Total: 2 COWS:  COWS Total Score: 3  Assessment: As above, patient improving, especially insofar as her mood and her appetite/sleep. Anxiety does persist, and she worries about persistent tachycardia, which seems to have coincided with Seroquel trial.      Treatment Plan Summary: Daily contact with patient to  assess and evaluate symptoms and progress in treatment Medication management  Plan:  Continue inpatient care and support, milieu Klonopin to 0.5 mgr QAM  and 1.5  mgr QHS . Norvasc to 2.5 mgrs QDAY   D/C Seroquel. Zoloft to 100  mgrs QAM Remeron  30 mgrs QHS Start Abilify at 5 mgrs QDAY.         Medical Decision Making Problem Points:  Established problem, stable/improving (1) and Review of last therapy session (1) Data Points:  Review of medication regiment & side effects (2) Review of new medications or change in dosage (2)  I certify that inpatient services furnished can reasonably be expected to improve the patient's condition.   Neita Garnet, MD  07/22/2014, 2:38 PM

## 2014-07-22 NOTE — Progress Notes (Addendum)
D:  Patient's self inventory sheet, patient has fair sleep, no medication for sleep last night.  Fair appetite, low energy level, fair concentration.  Rated depression 5, hopeless 3, anxiety 10.  Denied withdrawals.  Denied SI.  Does have physical problems, pain, tachycardia, high BP, constant thinking, worry.  Pain occasionally, worst pain #3, lower back, neck, shoulders, muscular pain.  Does take pain medication which is helpful.  Wants to get her health problems under control.  Plans to practice coping skills, breathing skills.  Feels she is gripped in fear-self absorbing thoughts.  No discharge plans.  Need to have vitals under control with medication, et.   A:  Medications administered per MD orders.  Emotional support and encouragement given patient. R:  Denied SI and HI, contracts for safety.  Denied A/V hallucinations.  Safety maintained with 15 minute checks.

## 2014-07-22 NOTE — Progress Notes (Signed)
The focus of this group is to educate the patient on the purpose and policies of crisis stabilization and provide a format to answer questions about their admission.  The group details unit policies and expectations of patients while admitted.  Patient attended 0900 nurse education orientation group this morning.  Patient actively participated, appropriate affect, alert, appropriate insight and engagement.  Today patient will work on her goals.

## 2014-07-22 NOTE — Progress Notes (Signed)
Pt attended group 

## 2014-07-22 NOTE — Progress Notes (Signed)
Patient stated she feels very anxious about her health issues, feels she needs prn anxiety medication.

## 2014-07-22 NOTE — Progress Notes (Signed)
D: Patient in her room on approach.  Patient states she had a better day.  Patient states she spoke with the doctor today and she feels better.  Patient states she ahs been spending time reading her bible.  Patient denies SI/HI and denies AVH. A: Staff to monitor Q 15 mins for safety.  Encouragement and support offered.  Scheduled medications administered per orders. R: Patient remains safe on the unit.  Patient attended group tonight.  Patient not visible on the unit tonight.  Patient taking adminstered medications.

## 2014-07-23 MED ORDER — ARIPIPRAZOLE 10 MG PO TABS
10.0000 mg | ORAL_TABLET | Freq: Every day | ORAL | Status: DC
  Administered 2014-07-24 – 2014-07-26 (×3): 10 mg via ORAL
  Filled 2014-07-23 (×4): qty 1
  Filled 2014-07-23: qty 4
  Filled 2014-07-23: qty 1

## 2014-07-23 MED ORDER — MIRTAZAPINE 15 MG PO TABS
45.0000 mg | ORAL_TABLET | Freq: Every day | ORAL | Status: DC
  Administered 2014-07-23 – 2014-07-25 (×3): 45 mg via ORAL
  Filled 2014-07-23 (×4): qty 1
  Filled 2014-07-23: qty 3
  Filled 2014-07-23: qty 1
  Filled 2014-07-23: qty 12

## 2014-07-23 NOTE — BHH Group Notes (Signed)
[facility] LCSW Aftercare Discharge Planning Group Note   07/23/2014 10:37 AM    Participation Quality:  Appropraite  Mood/Affect:  Appropriate  Depression Rating:  10  Anxiety Rating:  10  Thoughts of Suicide:  No  Will you contract for safety?   NA  Current AVH:  No  Plan for Discharge/Comments:  Patient attended discharge planning group and actively participated in group.  She will follow up with [facility].   CSW provided all participants with daily workbook.   Transportation Means: Patient has transportation.   Supports:  Patient has a support system.   Kendra Simmons, Eulas Post

## 2014-07-23 NOTE — Progress Notes (Signed)
Patient ID: Kendra Simmons, female   DOB: 1951/01/15, 63 y.o.   MRN: [id number] PER STATE REGULATIONS 482.30  THIS CHART WAS REVIEWED FOR MEDICAL NECESSITY WITH RESPECT TO THE PATIENT'S ADMISSION/ DURATION OF STAY.  NEXT REVIEW DATE: 07/27/2014  Chauncy Lean, RN, BSN CASE MANAGER

## 2014-07-23 NOTE — Progress Notes (Signed)
Kendra Simmons has had a difficult day today...saying focused on her feelings . She repeats over and over and over again " this is not good  I  am thinking about those hallucinations. That I had 7 years ago. I have been bad..ibuprofen feel manic, I think, I lied to the doctor".   A She takes her medications as ordered and tries to pay attention in the groups she attends,.She demonstrates little to no insight into her -problems and also she has a difficult time processing due to emotional lability.   R Safety is in place and poc cpnt.

## 2014-07-23 NOTE — Progress Notes (Signed)
D.  Pt pleasant on approach, continues to be anxious.  Pt hopeful that medication increase will help her to sleep tonight.  Positive for evening wrap up group, interacting appropriately with peers on the unit.  Denies SI/HI/hallucinations at this time.  A.  Support and encouragement offered  R.  Pt remains safe on unit, will continue to monitor.

## 2014-07-23 NOTE — Tx Team (Addendum)
Interdisciplinary Treatment Plan Update   Date Reviewed:  07/23/2014  Time Reviewed:  8:37 AM  Progress in Treatment:   Attending groups: Yes Participating in groups: Yes Taking medication as prescribed: Yes  Tolerating medication: Yes Family/Significant other contact made:  Collateral contact made with family Patient understands diagnosis: Yes  Discussing patient identified problems/goals with staff: Yes Medical problems stabilized or resolved: Yes Denies suicidal/homicidal ideation: Yes Patient has not harmed self or others: Yes  For review of initial/current patient goals, please see plan of care.  Estimated Length of Stay:  3-4 days  Reasons for Continued Hospitalization:  Anxiety Depression Medication stabilization  New Problems/Goals identified:    Discharge Plan or Barriers:   Home with outpatient follow up with [facility]  Additional Comments:   Continue medication stabilization   Attendees:  Patient:  07/23/2014 8:37 AM   Signature:  Gabriel Earing, MD 07/23/2014 8:37 AM  Signature:  07/23/2014 8:37 AM  Signature:   07/23/2014 8:37 AM  Signature: Talbert Cage,  RN 07/23/2014 8:37 AM  Signature:   Luz Brazen,  RN 07/23/2014 8:37 AM  Signature:  Joette Catching, LCSW 07/23/2014 8:37 AM  Signature:   07/23/2014 8:37 AM  Signature:  07/23/2014 8:37 AM  [place] 07/23/2014 8:37 AM  Signature: 07/23/2014  8:37 AM  Signature:   Lars Pinks, RN [facility] 07/23/2014  8:37 AM  Signature: 07/23/2014  8:37 AM    Scribe for Treatment Team:   Joette Catching,  07/23/2014 8:37 AM

## 2014-07-23 NOTE — BHH Group Notes (Signed)
[place] LCSW Group Therapy  Feelings Around Relapse 1:15 -2:30        07/23/2014   Type of Therapy:  Group Therapy  Participation Level:  Appropriate  Participation Quality:  Appropriate  Affect:  Appropriate  Cognitive:  Attentive Appropriate  Insight:  Developing/Improving  Engagement in Therapy: Developing/Improving  Modes of Intervention:  Discussion Exploration Problem-Solving Supportive  Summary of Progress/Problems:  The topic for today was feelings around relapse.   Patient processed feelings toward relapse and was able to relate to peers.  She shared relapse for her would be focusing on fears of what could happen in the future. Patient identified coping skills that can be used to prevent a relapse.   [name] 07/23/2014

## 2014-07-23 NOTE — Progress Notes (Signed)
Patient ID: Kendra Simmons, female   DOB: 19-Aug-1951, 63 y.o.   MRN: [id number] [facility] MD Progress Note  07/23/2014 5:53 PM Kendra Simmons  MRN:  [id number] Subjective:  Describes ongoing anxiety and some depression. Objective : I have discussed case with treatment team, and also met with patient. She reports that in the past she has had hallucinations, such as hearing music coming out of heater at home, and other auditory hallucinations, but has not had these recently and currently is not having active symptoms of psychosis. She worries about having " mania" , based on her perception of ruminating and thinking excessively. However, there is no pressured speech, no psychomotor restlessness, no grandiosity, no expansive or irritable affect, no flight of ideations. Rather she appears anxious, and depressed, although better than upon admission. She has had intermittent tachycardia, which led [place] to stop Seroquel. Today pulse was 117 this morning, but at this time it is 90x , and she states " it is because I feel less anxious right now". She had been relatively stable on a combination of Abilify , Seroquel, Xanax, but states that she decompensated after these medications were by outpatient provider prior to her admission. As noted, she also has significant psychosocial stressors related to her mother's illness and strain with her siblings.     Total Time spent with patient: 25 minutes   AXIS I: Major Depression, Recurrent severe, Anxiety Disorder NOS, consider Adjustment Disorder with Depressed Mood and Anxiety.     ADL's:  Intact  Sleep: fair   Appetite:  Good  Suicidal Ideation:  Denies Homicidal Ideation:  Denies AEB (as evidenced by):  Psychiatric Specialty Exam: Physical Exam  Review of Systems  Constitutional: Negative.   HENT: Negative.   Eyes: Negative.   Respiratory: Negative.   Cardiovascular: Negative.   Gastrointestinal: Negative.   Genitourinary: Negative.    Musculoskeletal: Negative.   Skin: Negative.   Neurological: Negative.   Endo/Heme/Allergies: Negative.   Psychiatric/Behavioral: Positive for depression. The patient is nervous/anxious.     Blood pressure 120/79, pulse 119, temperature 98.3 F (36.8 C), temperature source Oral, resp. rate 20, height _0  (1.626 m), weight 81.194 kg (179 lb), SpO2 98.00%.Body mass index is 30.71 kg/(m^2).   General Appearance: improved grooming   Eye Contact:: Good   Speech: Normal Rate   Volume: Normal   Mood: States she is more depressed today  Affect: Anxious, somatically focused   Thought Process: Linear, still ruminates  Orientation: NA- fully alert And attentive   Thought Content: denies hallucinations, no delusions. As noted , does state today she has a history of intermittent hallucinations in the past.    Suicidal Thoughts: No , denies any current suicidal plan or intent - contracts for safety on the unit.    Homicidal Thoughts: No   Memory: NA   Judgement: Fair   Insight: Fair   Psychomotor Activity: normal  Concentration: Good   Recall: Good   Fund of Knowledge:Good   Language: Good   Akathisia: Negative   Handed: Right   AIMS (if indicated):   Assets: Communication Skills  Desire for Improvement  Resilience   Sleep: Number of Hours: 4.25    Musculoskeletal:  Strength & Muscle Tone: within normal limits  Gait & Station: normal  Patient leans: N/A  Current Medications: Current Facility-Administered Medications  Medication Dose Route Frequency Provider Last Rate Last Dose  . acetaminophen (TYLENOL) tablet 650 mg  650 mg Oral Q6H PRN Waylan Boga, NP  650 mg at 07/23/14 1208  . alum & mag hydroxide-simeth (MAALOX/MYLANTA) 200-200-20 MG/5ML suspension 30 mL  30 mL Oral Q4H PRN Waylan Boga, NP   30 mL at 07/19/14 2253  . amLODipine (NORVASC) tablet 2.5 mg  2.5 mg Oral Daily Neita Garnet, MD   2.5 mg at 07/23/14 0811  . ARIPiprazole (ABILIFY) tablet 5 mg  5 mg Oral Daily  Neita Garnet, MD   5 mg at 07/23/14 3557  . clonazePAM (KLONOPIN) tablet 0.5 mg  0.5 mg Oral q morning - 10a Neita Garnet, MD   0.5 mg at 07/23/14 0813  . clonazePAM (KLONOPIN) tablet 1.5 mg  1.5 mg Oral QHS Neita Garnet, MD   1.5 mg at 07/22/14 2219  . liver oil-zinc oxide (DESITIN) 40 % ointment   Topical Daily Laverle Hobby, PA-C      . loperamide (IMODIUM) capsule 2 mg  2 mg Oral PRN Waylan Boga, NP   2 mg at 07/19/14 2336  . magnesium hydroxide (MILK OF MAGNESIA) suspension 30 mL  30 mL Oral Daily PRN Waylan Boga, NP      . mirtazapine (REMERON) tablet 30 mg  30 mg Oral QHS Neita Garnet, MD   30 mg at 07/22/14 2219  . pantoprazole (PROTONIX) EC tablet 40 mg  40 mg Oral Daily Waylan Boga, NP   40 mg at 07/23/14 0811  . sertraline (ZOLOFT) tablet 100 mg  100 mg Oral Daily Neita Garnet, MD   100 mg at 07/23/14 3220  . simvastatin (ZOCOR) tablet 10 mg  10 mg Oral q1800 Waylan Boga, NP   10 mg at 07/23/14 1637  . tamsulosin (FLOMAX) capsule 0.4 mg  0.4 mg Oral Daily Waylan Boga, NP   0.4 mg at 07/23/14 0811  . Vitamin D (Ergocalciferol) (DRISDOL) capsule 50,000 Units  50,000 Units Oral Q Wed Waylan Boga, NP   50,000 Units at 07/21/14 1011    Lab Results:  No results found for this or any previous visit (from the past 48 hour(s)).  Physical Findings: AIMS: Facial and Oral Movements Muscles of Facial Expression: None, normal Lips and Perioral Area: None, normal Jaw: None, normal Tongue: None, normal,Extremity Movements Upper (arms, wrists, hands, fingers): None, normal Lower (legs, knees, ankles, toes): None, normal, Trunk Movements Neck, shoulders, hips: None, normal, Overall Severity Severity of abnormal movements (highest score from questions above): None, normal Incapacitation due to abnormal movements: None, normal Patient's awareness of abnormal movements (rate only patient's report): No Awareness, Dental Status Current problems with teeth and/or dentures?: No Does  patient usually wear dentures?: No  CIWA:  CIWA-Ar Total: 2 COWS:  COWS Total Score: 3  Assessment: Clinical presentation has varied from day to day, but overall she states she is still quite anxious and ( to a lesser degree than anxiety) depressed. She has a history of good tolerance and response to Abilify. As Seroquel seems to have been contributing to orthostatic hypotension and tachycardia, it has been stopped.       Treatment Plan Summary: Daily contact with patient to assess and evaluate symptoms and progress in treatment Medication management  Plan:  Continue inpatient care and support, milieu. We are working on disposition planning - patient expresses interest in IOP, which may be an optimal disposition plan for patient if possible.  Klonopin to 0.5 mgr QAM  and 1.5  mgr QHS . Norvasc to 2.5 mgrs QDAY   Zoloft to 100  mgrs QAM Increase Remeron  45 mgrs QHS Increase Abilify at  10 mgrs QDAY.         Medical Decision Making Problem Points:  Established problem, stable/improving (1), Review of last therapy session (1) and Review of psycho-social stressors (1) Data Points:  Review of medication regiment & side effects (2) Review of new medications or change in dosage (2)  I certify that inpatient services furnished can reasonably be expected to improve the patient's condition.   Neita Garnet, MD  07/23/2014, 5:53 PM

## 2014-07-24 DIAGNOSIS — F4323 Adjustment disorder with mixed anxiety and depressed mood: Secondary | ICD-10-CM

## 2014-07-24 DIAGNOSIS — F411 Generalized anxiety disorder: Secondary | ICD-10-CM

## 2014-07-24 DIAGNOSIS — F332 Major depressive disorder, recurrent severe without psychotic features: Secondary | ICD-10-CM

## 2014-07-24 MED ORDER — IBUPROFEN 600 MG PO TABS
600.0000 mg | ORAL_TABLET | Freq: Four times a day (QID) | ORAL | Status: DC | PRN
  Administered 2014-07-24 – 2014-07-26 (×6): 600 mg via ORAL
  Filled 2014-07-24 (×6): qty 1

## 2014-07-24 NOTE — Progress Notes (Signed)
D: Pt denies SI/HI/AVH. Pt is pleasant and cooperative. Pt said she was going to be more positive about things going on in her life.   A: Pt was offered support and encouragement. Pt was given scheduled medications. Pt was encourage to attend groups. Q 15 minute checks were done for safety.  R:Pt attends groups and interacts well with peers and staff. Pt is taking medication. Pt has no complaints at this time .Pt receptive to treatment and safety maintained on unit.

## 2014-07-24 NOTE — BHH Group Notes (Signed)
[place] Group Notes:  (Clinical Social Work)  07/24/2014   1:15-2:15PM  Summary of Progress/Problems:   The main focus of today's process group was for the patient to identify ways in which they have sabotaged their own mental health wellness/recovery.  Motivational interviewing and a handout were used to explore the benefits and costs of their self-sabotaging behavior as well as the benefits and costs of changing this behavior.  The Stages of Change were explained to the group using a handout, and patients identified where they are with regard to changing self-defeating behaviors.  The patient expressed she self-sabotages with rumination, and while she was present throughout group and seemed to be paying attention and participating, later said she was too tired to really feel that she was involved.  Type of Therapy:  Process Group  Participation Level:  Active  Participation Quality:  Attentive and Sharing  Affect:  Depressed and Flat  Cognitive:  Oriented  Insight:  Improving  Engagement in Therapy:  Engaged  Modes of Intervention:  Education, Motivational Interviewing   Selmer Dominion, LCSW 07/24/2014, 4:00pm

## 2014-07-24 NOTE — Progress Notes (Signed)
Psychoeducational Group Note Time: 0930 Date:  07/24/2014  Group Topic/Focus:  Identifying Needs:   The focus of this group is to help patients identify their personal needs that have been historically problematic and identify healthy behaviors to address their needs.  Participation Level:  Active  Participation Quality: good   Affect: flat  Cognitive:    Insight:  good  Engagement in Group: engaged  Additional Comments:

## 2014-07-24 NOTE — Progress Notes (Signed)
Patient ID: Kendra Simmons, female   DOB: 1951-12-13, 63 y.o.   MRN: [id number] [facility] MD Progress Note  07/24/2014 11:37 AM SHALIN LINDERS  MRN:  [id number]  Subjective:  "Im not doing good today, I am having a lot of ruminating thoughts. It is like a tape recorder went off in my head. The depression would be better if I didn't have the ruminating thoughts." Currently rates depression at 5/10, and anxiety 8/10 at this time. Denies SI/HI/AVH.  She is actively participating in group, although this may be contributing to her anxiety. Groups have been beneficial and she is learning how to cope and keep trying" I dont want to give up". Upon discharge she plans to go back home to see her cats, and stocking up on supplies for nourishment. I plan to attend IOP, and step back from familial issues that are effecting me.    Objective : Patient seen and chart reviewed.  She reports that she can sleep well due to ruminating thoughts.  She worries about having " mania" , based on her perception of ruminating and thinking excessively. However, there is no pressured speech, no psychomotor restlessness, no grandiosity, no expansive or irritable affect, no flight of ideations. She has had intermittent tachycardia, which led [place] to stop Seroquel. Today pulse was 95 this morning. She had been relatively stable on a combination of Abilify and Xanax, but has not noticed a difference in her moods or thoughts at this time. As noted, she also has significant psychosocial stressors related to her mother's illness and strain with her siblings.   Total Time spent with patient: 25 minutes   AXIS I: Major Depression, Recurrent severe, Anxiety Disorder NOS, consider Adjustment Disorder with Depressed Mood and Anxiety.     ADL's:  Intact  Sleep: poor   Appetite:  Good  Suicidal Ideation:  Denies Homicidal Ideation:  Denies AEB (as evidenced by):  Psychiatric Specialty Exam: Physical Exam  Constitutional: She is  oriented to person, place, and time.  Neck: Normal range of motion.  Musculoskeletal: Normal range of motion.  Neurological: She is alert and oriented to person, place, and time.  Skin: Skin is warm and dry.  Psychiatric: Her behavior is normal. Judgment and thought content normal.    Review of Systems  Constitutional: Negative.   HENT: Negative.   Eyes: Negative.   Respiratory: Negative.   Cardiovascular: Negative.   Gastrointestinal: Negative.   Genitourinary: Negative.   Musculoskeletal: Negative.   Skin: Negative.   Neurological: Negative.   Endo/Heme/Allergies: Negative.   Psychiatric/Behavioral: Positive for depression. Negative for suicidal ideas, hallucinations and substance abuse. The patient has insomnia. The patient is not nervous/anxious.        Ruminating thoughts    Blood pressure 127/85, pulse 95, temperature 98.8 F (37.1 C), temperature source Oral, resp. rate 16, height 5\' 4"  (1.626 m), weight 81.194 kg (179 lb), SpO2 98.00%.Body mass index is 30.71 kg/(m^2).   General Appearance: improved grooming   Eye Contact:: Good   Speech: Normal Rate   Volume: Normal   Mood: depressive and flat  Affect: Anxious, somatically focused   Thought Process: Linear, still ruminates  Orientation: NA- fully alert And attentive   Thought Content: denies hallucinations, no delusions. As noted , does state today she has a history of intermittent hallucinations in the past.    Suicidal Thoughts: No , denies any current suicidal plan or intent - contracts for safety on the unit.    Homicidal Thoughts: No  Memory: NA   Judgement: Fair   Insight: Fair   Psychomotor Activity: normal  Concentration: Good   Recall: Good   Fund of Knowledge:Good   Language: Good   Akathisia: Negative   Handed: Right   AIMS (if indicated):   Assets: Communication Skills  Desire for Improvement  Resilience   Sleep: Number of Hours: 4.25    Musculoskeletal:  Strength & Muscle Tone: within normal  limits  Gait & Station: normal  Patient leans: N/A  Current Medications: Current Facility-Administered Medications  Medication Dose Route Frequency Provider Last Rate Last Dose  . acetaminophen (TYLENOL) tablet 650 mg  650 mg Oral Q6H PRN Waylan Boga, NP   650 mg at 07/24/14 0523  . alum & mag hydroxide-simeth (MAALOX/MYLANTA) 200-200-20 MG/5ML suspension 30 mL  30 mL Oral Q4H PRN Waylan Boga, NP   30 mL at 07/19/14 2253  . amLODipine (NORVASC) tablet 2.5 mg  2.5 mg Oral Daily Neita Garnet, MD   2.5 mg at 07/24/14 9381  . ARIPiprazole (ABILIFY) tablet 10 mg  10 mg Oral Daily Neita Garnet, MD   10 mg at 07/24/14 0175  . clonazePAM (KLONOPIN) tablet 0.5 mg  0.5 mg Oral q morning - 10a Neita Garnet, MD   0.5 mg at 07/24/14 1025  . clonazePAM (KLONOPIN) tablet 1.5 mg  1.5 mg Oral QHS Neita Garnet, MD   1.5 mg at 07/23/14 2214  . liver oil-zinc oxide (DESITIN) 40 % ointment   Topical Daily Laverle Hobby, PA-C      . loperamide (IMODIUM) capsule 2 mg  2 mg Oral PRN Waylan Boga, NP   2 mg at 07/24/14 0013  . magnesium hydroxide (MILK OF MAGNESIA) suspension 30 mL  30 mL Oral Daily PRN Waylan Boga, NP      . mirtazapine (REMERON) tablet 45 mg  45 mg Oral QHS Neita Garnet, MD   45 mg at 07/23/14 2214  . pantoprazole (PROTONIX) EC tablet 40 mg  40 mg Oral Daily Waylan Boga, NP   40 mg at 07/23/14 0811  . sertraline (ZOLOFT) tablet 100 mg  100 mg Oral Daily Neita Garnet, MD   100 mg at 07/24/14 8527  . simvastatin (ZOCOR) tablet 10 mg  10 mg Oral q1800 Waylan Boga, NP   10 mg at 07/23/14 1637  . tamsulosin (FLOMAX) capsule 0.4 mg  0.4 mg Oral Daily Waylan Boga, NP   0.4 mg at 07/23/14 2215  . Vitamin D (Ergocalciferol) (DRISDOL) capsule 50,000 Units  50,000 Units Oral Q Wed Waylan Boga, NP   50,000 Units at 07/21/14 1011    Lab Results:  No results found for this or any previous visit (from the past 48 hour(s)).  Physical Findings: AIMS: Facial and Oral Movements Muscles of  Facial Expression: None, normal Lips and Perioral Area: None, normal Jaw: None, normal Tongue: None, normal,Extremity Movements Upper (arms, wrists, hands, fingers): None, normal Lower (legs, knees, ankles, toes): None, normal, Trunk Movements Neck, shoulders, hips: None, normal, Overall Severity Severity of abnormal movements (highest score from questions above): None, normal Incapacitation due to abnormal movements: None, normal Patient's awareness of abnormal movements (rate only patient's report): No Awareness, Dental Status Current problems with teeth and/or dentures?: No Does patient usually wear dentures?: No  CIWA:  CIWA-Ar Total: 2 COWS:  COWS Total Score: 3  Assessment: Clinical presentation has varied from day to day, but overall she states she is still quite anxious and ( to a lesser degree than anxiety) depressed. She  has a history of good tolerance and response to Abilify.   Treatment Plan Summary: Daily contact with patient to assess and evaluate symptoms and progress in treatment Medication management  Plan:  Continue inpatient care and support, milieu. We are working on disposition planning - patient expresses interest in IOP, which may be an optimal disposition plan for patient if possible.  Klonopin to 0.5 mgr QAM  and 1.5  mgr QHS . Norvasc to 2.5 mgrs QDAY   Zoloft to 100  mgrs QAM Increase Remeron  45 mgrs QHS Increase Abilify at 10 mgrs QDAY.    Medical Decision Making Problem Points:  Established problem, stable/improving (1), Review of last therapy session (1) and Review of psycho-social stressors (1) Data Points:  Review of medication regiment & side effects (2) Review of new medications or change in dosage (2)  I certify that inpatient services furnished can reasonably be expected to improve the patient's condition.   Nanci Pina, FNP-BC 07/24/2014, 11:37 AM  I agreed with the findings, treatment and disposition plan of this patient. Berniece Andreas, MD

## 2014-07-24 NOTE — Progress Notes (Addendum)
D Yosselyn cont to endorse great feelings ( intense) of anxiety, fear of the future and worry about being DC"d on Monday.   A SHe is timid, shy and focuses on the negative..No matter what the situation entails. She attends her groups, is engaged in the group discussion and is actively trying to learn healthier coping skills. She completed her morning assessment and on it she writes she cont to struggle with her racing thoughts and she  she rates her depression, hopelessness and anxiety " 5/5/8"   R She requested and is given ibuprofen for c/o discomfort and states " a little" improvement after taking it. Safety is in place

## 2014-07-24 NOTE — Progress Notes (Signed)
Psychoeducational Group Note  Date: 07/24/2014 Time:  1015  Group Topic/Focus:  Identifying Needs:   The focus of this group is to help patients identify their personal needs that have been historically problematic and identify healthy behaviors to address their needs.  Participation Level:  Active  Participation Quality:  Appropriate  Affect:  Appropriate  Cognitive:  Oriented  Insight:  Improving  Engagement in Group:  Engaged  Additional Comments:  Pt was attentive and participated. Listened attentively to others in the group.  Kendra Simmons

## 2014-07-25 MED ORDER — TAMSULOSIN HCL 0.4 MG PO CAPS
0.4000 mg | ORAL_CAPSULE | Freq: Every day | ORAL | Status: DC
  Administered 2014-07-25: 0.4 mg via ORAL
  Filled 2014-07-25 (×3): qty 1

## 2014-07-25 NOTE — Progress Notes (Signed)
Adult Psychoeducational Group Note  Date:  07/25/2014 Time:  12:28 AM  Group Topic/Focus:  Wrap-Up Group:   The focus of this group is to help patients review their daily goal of treatment and discuss progress on daily workbooks.  Participation Level:  Active  Participation Quality:  Appropriate  Affect:  Appropriate  Cognitive:  Appropriate  Insight: Appropriate  Engagement in Group:  Engaged  Modes of Intervention:  Discussion  Additional Comments:  The patient expressed that she learned from group about a higher power.The patient also expressed that she learned more about having racing thoughts.  Nash Shearer 07/25/2014, 12:28 AM

## 2014-07-25 NOTE — Progress Notes (Signed)
Psychoeducational Group Note  Date: 07/25/2014 Time:  0930  Group Topic/Focus:  Gratefulness:  The focus of this group is to help patients identify what two things they are most grateful for in their lives. What helps ground them and to center them on their work to their recovery.  Participation Level:  Active  Participation Quality:  Appropriate  Affect:  appropriate  Cognitive:  Oriented  Insight:  Improving  Engagement in Group:  Engaged  Additional Comments:  Attended the group and participated    Kendra Simmons

## 2014-07-25 NOTE — [facility] Group Notes (Signed)
[place] Group Notes:  (Clinical Social Work)  07/25/2014   1:15-2:15PM  Summary of Progress/Problems:  The main focus of today's process group was to   identify the patient's current support system and decide on other supports that can be put in place.  The picture on workbook was used to discuss why additional supports are needed.  An emphasis was placed on using counselor, doctor, therapy groups, 12-step groups, and problem-specific support groups to expand supports.   There was also an extensive discussion about what constitutes a healthy support versus an unhealthy support.  The patient expressed full comprehension of the concepts presented.  One current health support is her family, including her sister and mother.  She also has 2 friends that are helpful.  She feels good about her current support.  When the group discussed calling someone while in the hospital, and asking them to hold the patient accountable to the discharge plan, she stated she will call her sister tonight about this.  She appeared less depressed than group yesterday, same time.  Type of Therapy:  Process Group  Participation Level:  Active  Participation Quality:  Attentive and Sharing  Affect:  Blunted  Cognitive:  Appropriate and Oriented  Insight:  Engaged  Engagement in Therapy:  Engaged  Modes of Intervention:  Education,  Support and [employer], LCSW 07/25/2014, 4:00pm

## 2014-07-25 NOTE — Progress Notes (Signed)
Psychoeducational Group Note  Date:  07/25/2014 Time:  1015  Group Topic/Focus:  Making Healthy Choices:   The focus of this group is to help patients identify negative/unhealthy choices they were using prior to admission and identify positive/healthier coping strategies to replace them upon discharge.  Participation Level:  Active  Participation Quality:  Attentive  Affect:  Appropriate  Cognitive:  Oriented  Insight:  Improving  Engagement in Group:  Engaged  Additional Comments:  Pt attended group and participated in the discussion  Paulino Rily 07/25/2014

## 2014-07-25 NOTE — Progress Notes (Signed)
D: Pt presents with appropriate affect, pleasant mood.  Pt denies SI/HI.  Denies AH/VH.  Pt reports "It was a long day.  I'm going home tomorrow and hopefully I can sleep better when I'm there.  My sister is getting the house cleaned up and changing all the linens.  I learned a lot in group today and took notes and I'm going to apply what I learned when I leave."   A: Support and encouragement offered.  Medications administered per order.  Safety maintained.   R: Pt interacts with peers and staff appropriately.  Pt attended evening group and was compliant with medications as ordered.  Pt is in no acute distress.  Will continue to monitor for safety.

## 2014-07-25 NOTE — Progress Notes (Signed)
Kendra Simmons remains anxious, worried about discharge from the hospital tomorrow  and generally  Worried about " life". She is soft spoken. She is compliant with her meds and attends her groups as planned, complaining this AM about " not getting enough sleep here". She is seen resting comfortably in her bed  After lunch today.   A She does fill out her morning assessment and on it she writes her depression, hopelessness and anxiety  Are " 4/4/7" and she says she wants to cont to " work on coping skills".    R Safety is in place and poc cont.

## 2014-07-25 NOTE — Progress Notes (Signed)
Patient ID: Kendra Simmons, female   DOB: 09-Mar-1951, 63 y.o.   MRN: [id number] [facility] MD Progress Note  07/25/2014 11:48 AM Kendra Simmons  MRN:  [id number]  Subjective:  "Im really tired today. I slept fairly well. I had a nightmare last night, and I woke up screaming. Im still having ruminating thoughts. I cant remember a time that I have slept and not dreamed. I don't think I am getting into a deep sleep. Is this ever going to go away. " Currently rates depression at 4/10, and anxiety 7/10 at this time. Denies SI/HI/AVH.  She is actively participating in group. Groups have been beneficial but are becoming repetitive " I have been here for two weeks." Upon discharge she plans to go back home to see her cats, and stocking up on supplies for nourishment "since I been here I have learned what foods I am comfortable with eating and ones that upset my stomach." I am going to have a cousin"John" stay with me a few nights to see how I rest when i am at home, and I will make sure me sister turns off her phone for a couple of nights".  Objective : Patient seen and chart reviewed.  She reports that she didn't rest well last night and this is why her depression is worse.  She has had intermittent tachycardia, which has decreased with cessation of the Seroquel.  Today pulse was 100 this morning. She had been relatively stable on a combination of Abilify and Xanax, but has not noticed a difference in her moods or thoughts at this time. As noted, she also has significant psychosocial stressors related to her mother's illness and strain with her siblings.   Total Time spent with patient: 25 minutes   AXIS I: Major Depression, Recurrent severe, Anxiety Disorder NOS, consider Adjustment Disorder with Depressed Mood and Anxiety.     ADL's:  Intact  Sleep: poor   Appetite:  Good  Suicidal Ideation:  Denies Homicidal Ideation:  Denies AEB (as evidenced by):  Psychiatric Specialty Exam: Physical Exam   Constitutional: She is oriented to person, place, and time.  Neck: Normal range of motion.  Musculoskeletal: Normal range of motion.  Neurological: She is alert and oriented to person, place, and time.  Skin: Skin is warm and dry.  Psychiatric: Her behavior is normal. Judgment and thought content normal.    Review of Systems  Constitutional: Negative.   HENT: Negative.   Eyes: Negative.   Respiratory: Negative.   Cardiovascular: Negative.   Gastrointestinal: Negative.   Genitourinary: Negative.   Musculoskeletal: Negative.   Skin: Negative.   Neurological: Negative.   Endo/Heme/Allergies: Negative.   Psychiatric/Behavioral: Positive for depression. Negative for suicidal ideas, hallucinations and substance abuse. The patient has insomnia. The patient is not nervous/anxious.        Ruminating thoughts    Blood pressure 137/91, pulse 100, temperature 98 F (36.7 C), temperature source Oral, resp. rate 18, height 5\' 4"  (1.626 m), weight 81.194 kg (179 lb), SpO2 98.00%.Body mass index is 30.71 kg/(m^2).   General Appearance: improved grooming   Eye Contact:: Good   Speech: Normal Rate   Volume: Normal   Mood: depressive and flat  Affect: Anxious, somatically focused   Thought Process: Linear, still ruminates  Orientation: NA- fully alert And attentive   Thought Content: denies hallucinations, no delusions. As noted , does state today she has a history of intermittent hallucinations in the past.    Suicidal Thoughts: No ,  denies any current suicidal plan or intent - contracts for safety on the unit.    Homicidal Thoughts: No   Memory: NA   Judgement: Fair   Insight: Fair   Psychomotor Activity: normal  Concentration: Good   Recall: Good   Fund of Knowledge:Good   Language: Good   Akathisia: Negative   Handed: Right   AIMS (if indicated):   Assets: Communication Skills  Desire for Improvement  Resilience   Sleep: Number of Hours: 4.25    Musculoskeletal:  Strength &  Muscle Tone: within normal limits  Gait & Station: normal  Patient leans: N/A  Current Medications: Current Facility-Administered Medications  Medication Dose Route Frequency Provider Last Rate Last Dose  . acetaminophen (TYLENOL) tablet 650 mg  650 mg Oral Q6H PRN Waylan Boga, NP   650 mg at 07/25/14 0244  . alum & mag hydroxide-simeth (MAALOX/MYLANTA) 200-200-20 MG/5ML suspension 30 mL  30 mL Oral Q4H PRN Waylan Boga, NP   30 mL at 07/19/14 2253  . amLODipine (NORVASC) tablet 2.5 mg  2.5 mg Oral Daily Neita Garnet, MD   2.5 mg at 07/25/14 0800  . ARIPiprazole (ABILIFY) tablet 10 mg  10 mg Oral Daily Neita Garnet, MD   10 mg at 07/25/14 0800  . clonazePAM (KLONOPIN) tablet 0.5 mg  0.5 mg Oral q morning - 10a Neita Garnet, MD   0.5 mg at 07/25/14 0800  . clonazePAM (KLONOPIN) tablet 1.5 mg  1.5 mg Oral QHS Neita Garnet, MD   1.5 mg at 07/24/14 2214  . ibuprofen (ADVIL,MOTRIN) tablet 600 mg  600 mg Oral Q6H PRN Neita Garnet, MD   600 mg at 07/25/14 1026  . liver oil-zinc oxide (DESITIN) 40 % ointment   Topical Daily Laverle Hobby, PA-C      . loperamide (IMODIUM) capsule 2 mg  2 mg Oral PRN Waylan Boga, NP   2 mg at 07/24/14 0013  . magnesium hydroxide (MILK OF MAGNESIA) suspension 30 mL  30 mL Oral Daily PRN Waylan Boga, NP      . mirtazapine (REMERON) tablet 45 mg  45 mg Oral QHS Neita Garnet, MD   45 mg at 07/24/14 2214  . pantoprazole (PROTONIX) EC tablet 40 mg  40 mg Oral Daily Waylan Boga, NP   40 mg at 07/25/14 0800  . sertraline (ZOLOFT) tablet 100 mg  100 mg Oral Daily Neita Garnet, MD   100 mg at 07/24/14 1610  . simvastatin (ZOCOR) tablet 10 mg  10 mg Oral q1800 Waylan Boga, NP   10 mg at 07/23/14 1637  . tamsulosin (FLOMAX) capsule 0.4 mg  0.4 mg Oral QHS Neita Garnet, MD      . Vitamin D (Ergocalciferol) (DRISDOL) capsule 50,000 Units  50,000 Units Oral Q Wed Waylan Boga, NP   50,000 Units at 07/21/14 1011    Lab Results:  No results found for this or  any previous visit (from the past 48 hour(s)).  Physical Findings: AIMS: Facial and Oral Movements Muscles of Facial Expression: None, normal Lips and Perioral Area: None, normal Jaw: None, normal Tongue: None, normal,Extremity Movements Upper (arms, wrists, hands, fingers): None, normal Lower (legs, knees, ankles, toes): None, normal, Trunk Movements Neck, shoulders, hips: None, normal, Overall Severity Severity of abnormal movements (highest score from questions above): None, normal Incapacitation due to abnormal movements: None, normal Patient's awareness of abnormal movements (rate only patient's report): No Awareness, Dental Status Current problems with teeth and/or dentures?: No Does patient usually wear dentures?:  No  CIWA:  CIWA-Ar Total: 2 COWS:  COWS Total Score: 3  Assessment: Clinical presentation has varied from day to day, but overall she states she is still quite anxious and ( to a lesser degree than anxiety) depressed. She has a history of good tolerance and response to Abilify. She has hx of sleep apnea, some of the fatigue, headaches, and daytime sleepiness may be contributed to this condition.   Treatment Plan Summary: Daily contact with patient to assess and evaluate symptoms and progress in treatment Medication management  Plan:  Continue inpatient care and support, milieu. We are working on disposition planning - patient expresses interest in IOP, which may be an optimal disposition plan for patient if possible.  Klonopin to 0.5 mgr QAM  and 1.5  mgr QHS . Norvasc to 2.5 mgrs QDAY   Zoloft to 100  mgrs QAM Continue Remeron  45 mgrs QHS Continue Abilify at 10 mgrs QDAY.  Follow up with PCP Dr. Carla Drape for sleep study and possible oral appliance device. Unable to tolerate CPAP due to claustrophobia.    Medical Decision Making Problem Points:  Established problem, stable/improving (1), Review of last therapy session (1) and Review of psycho-social stressors  (1) Data Points:  Review of medication regiment & side effects (2) Review of new medications or change in dosage (2)  I certify that inpatient services furnished can reasonably be expected to improve the patient's condition.   Nanci Pina, FNP-BC 07/25/2014, 11:48 AM  I agreed with the findings, treatment and disposition plan of this patient. Berniece Andreas, MD

## 2014-07-25 NOTE — Progress Notes (Signed)
Adult Psychoeducational Group Note  Date:  07/25/2014 Time:  10:08 PM  Group Topic/Focus:  Wrap-Up Group:   The focus of this group is to help patients review their daily goal of treatment and discuss progress on daily workbooks.  Participation Level:  Minimal  Participation Quality:  Appropriate  Affect:  Appropriate  Cognitive:  Appropriate  Insight: Appropriate  Engagement in Group:  Engaged  Modes of Intervention:  Discussion  Additional Comments:    Gaege Sangalang A 07/25/2014, 10:08 PM

## 2014-07-26 DIAGNOSIS — F410 Panic disorder [episodic paroxysmal anxiety] without agoraphobia: Secondary | ICD-10-CM | POA: Diagnosis present

## 2014-07-26 DIAGNOSIS — F322 Major depressive disorder, single episode, severe without psychotic features: Principal | ICD-10-CM

## 2014-07-26 DIAGNOSIS — F411 Generalized anxiety disorder: Secondary | ICD-10-CM | POA: Diagnosis present

## 2014-07-26 MED ORDER — VITAMIN D (ERGOCALCIFEROL) 1.25 MG (50000 UNIT) PO CAPS: 50000.0000 [IU] | ORAL_CAPSULE | ORAL | Status: DC

## 2014-07-26 MED ORDER — AMLODIPINE BESYLATE 2.5 MG PO TABS
2.5000 mg | ORAL_TABLET | Freq: Every day | ORAL | Status: DC
Start: 2014-07-26 — End: 2014-08-09

## 2014-07-26 MED ORDER — ZINC OXIDE 40 % EX OINT: TOPICAL_OINTMENT | Freq: Every day | CUTANEOUS | Status: DC

## 2014-07-26 MED ORDER — ARIPIPRAZOLE 10 MG PO TABS: 10.0000 mg | ORAL_TABLET | Freq: Every day | ORAL | Status: DC

## 2014-07-26 MED ORDER — SERTRALINE HCL 100 MG PO TABS: 100.0000 mg | ORAL_TABLET | Freq: Every day | ORAL | Status: DC

## 2014-07-26 MED ORDER — PRAVASTATIN SODIUM 20 MG PO TABS: 20.0000 mg | ORAL_TABLET | Freq: Every day | ORAL | Status: DC

## 2014-07-26 MED ORDER — OMEPRAZOLE 20 MG PO CPDR: 20.0000 mg | DELAYED_RELEASE_CAPSULE | Freq: Every day | ORAL | Status: DC

## 2014-07-26 MED ORDER — MIRTAZAPINE 45 MG PO TABS: 45.0000 mg | ORAL_TABLET | Freq: Every day | ORAL | Status: DC

## 2014-07-26 MED ORDER — TAMSULOSIN HCL 0.4 MG PO CAPS: 0.4000 mg | ORAL_CAPSULE | Freq: Every day | ORAL | Status: DC

## 2014-07-26 MED ORDER — CLONAZEPAM 0.5 MG PO TABS: ORAL_TABLET | ORAL | Status: DC

## 2014-07-26 NOTE — Discharge Summary (Signed)
Physician Discharge Summary Note  Patient:  Kendra Simmons is an 63 y.o., female MRN:  [id number] DOB:  [birth year] Patient phone:  [phone] (home)  Patient address:   [place] [address],  Total Time spent with patient: Greater than 30 minutes  Date of Admission:  07/11/2014 Date of Discharge: 07/26/14  Reason for Admission: Mood stabilization  Discharge Diagnoses: Active Problems:   Severe major depression without psychotic features   GAD (generalized anxiety disorder)   Panic attacks   Psychiatric Specialty Exam: Physical Exam  Psychiatric: Her speech is normal and behavior is normal. Judgment and thought content normal. Her mood appears not anxious. Her affect is not angry, not blunt, not labile and not inappropriate. Cognition and memory are normal. She does not exhibit a depressed mood.    Review of Systems  Constitutional: Negative.   HENT: Negative.   Eyes: Negative.   Respiratory: Negative.   Cardiovascular: Negative.   Gastrointestinal: Negative.   Genitourinary: Negative.   Musculoskeletal: Negative.   Skin: Negative.   Neurological: Negative.   Endo/Heme/Allergies: Negative.   Psychiatric/Behavioral: Positive for depression (Stable). Negative for suicidal ideas, hallucinations, memory loss and substance abuse. The patient has insomnia (Stable). The patient is not nervous/anxious.     Blood pressure 149/74, pulse 91, temperature 97.5 F (36.4 C), temperature source Oral, resp. rate 20, height 5\' 4"  (1.626 m), weight 81.194 kg (179 lb), SpO2 98.00%.Body mass index is 30.71 kg/(m^2).   General Appearance: Fairly Groomed   Engineer, water:: Fair   Speech: Clear and Coherent and Slow   Volume: Decreased   Mood: Anxious and Depressed   Affect: sad, anxious tearful   Thought Process: Coherent and Goal Directed   Orientation: Full (Time, Place, and Person)   Thought Content: symptoms, worries, concerns   Suicidal Thoughts: No   Homicidal  Thoughts: No   Memory: Immediate; Fair  Recent; Fair  Remote; Fair   Judgement: Fair   Insight: Present   Psychomotor Activity: Normal   Concentration: Fair   Recall: [employer] of Knowledge:NA   Language: Fair   Akathisia: No   Handed:   AIMS (if indicated):   Assets: Desire for Improvement   Sleep: Number of Hours: 5    Past Psychiatric History: Diagnosis: Severe major depression without psychotic features  Hospitalizations: [facility] adult unit  Outpatient Care: [facility] with Dr. Adele Schilder  Substance Abuse Care: NA  Self-Mutilation: NA  Suicidal Attempts: NA  Violent Behaviors: NA   Musculoskeletal: Strength & Muscle Tone: within normal limits Gait & Station: normal Patient leans: N/A  DSM5: Schizophrenia Disorders:  NA Obsessive-Compulsive Disorders:  NA Trauma-Stressor Disorders:  NA Substance/Addictive Disorders:  NA Depressive Disorders:  Severe major depression without psychotic features  Axis Diagnosis:  AXIS I:  Severe major depression without psychotic features AXIS II:  Deferred AXIS III:   Past Medical History  Diagnosis Date  . High cholesterol   . Depression   . Bipolar 1 disorder   . Arthritis   . Anxiety   . GERD (gastroesophageal reflux disease)    AXIS IV:  other psychosocial or environmental problems and mental illness, chronic AXIS V:  63  Level of Care:  OP  Hospital Course:  63 year old female, Who reports a long history of depresison and anxiety, who presented to ED reporting worsening depression, worsening insomnia, describing her mood as being " in a valley I cannot get out of".  While a patient in this  hospital, Kendra Simmons received medication management for mood stabilization. She was medicated and discharged on Abilify 10 mg for mood control,  Mirtazapine 45 mg Q bedtime for depression/insomnia, Sertraline 100 mg daily for depression and clonazepam 0.5 mg for anxiety. She also was enrolled and participated in the group  counseling sessions being offered and held on this unit. She learned coping skills that should help her cope better after discharge. She also was resumed on her pertinent home medications for her other medical issues that she presented. She tolerated her treatment regimen without any significant adverse effects and or reactions.   Kendra Simmons's symptoms responded well to her treatment regimen. This is evidenced by her reports of improved mood and presentation of good affects/eye contact. She is currently being discharged to continue psychiatric care on an outpatient basis at the [facility] with Dr. Adele Schilder for medication management and mental health IOP. She is provided with all the necessary information required to make this appointment without problems. Upon discharge, she adamantly denies any SIHI, AVH, delusional thoughts and or paranoia. She received from the [facility], a 4 days worth supply samples of her [facility] discharge medications. She left [facility] with all belongings in no distress. Transportation per sister.  Consults:  psychiatry  Significant Diagnostic Studies:  labs: CBC with diff, CMP, UDS, toxicology tests, U/A  Discharge Vitals:   Blood pressure 149/74, pulse 91, temperature 97.5 F (36.4 C), temperature source Oral, resp. rate 20, height 5\' 4"  (1.626 m), weight 81.194 kg (179 lb), SpO2 98.00%. Body mass index is 30.71 kg/(m^2). Lab Results:   No results found for this or any previous visit (from the past 72 hour(s)).  Physical Findings: AIMS: Facial and Oral Movements Muscles of Facial Expression: None, normal Lips and Perioral Area: None, normal Jaw: None, normal Tongue: None, normal,Extremity Movements Upper (arms, wrists, hands, fingers): None, normal Lower (legs, knees, ankles, toes): None, normal, Trunk Movements Neck, shoulders, hips: None, normal, Overall Severity Severity of abnormal movements (highest score from questions above): None, normal Incapacitation  due to abnormal movements: None, normal Patient's awareness of abnormal movements (rate only patient's report): No Awareness, Dental Status Current problems with teeth and/or dentures?: No Does patient usually wear dentures?: No  CIWA:  CIWA-Ar Total: 2 COWS:  COWS Total Score: 3  Psychiatric Specialty Exam: See Psychiatric Specialty Exam and Suicide Risk Assessment completed by Attending Physician prior to discharge.  Discharge destination:  Home  Is patient on multiple antipsychotic therapies at discharge:  No   Has Patient had three or more failed trials of antipsychotic monotherapy by history:  No  Recommended Plan for Multiple Antipsychotic Therapies: NA    Medication List    STOP taking these medications       ALPRAZolam 1 MG tablet  Commonly known as:  XANAX     doxepin 25 MG capsule  Commonly known as:  SINEQUAN     QUEtiapine 100 MG tablet  Commonly known as:  SEROQUEL      TAKE these medications     Indication   amLODipine 2.5 MG tablet  Commonly known as:  NORVASC  Take 1 tablet (2.5 mg total) by mouth daily. For high blood pressure   Indication:  High Blood Pressure     ARIPiprazole 10 MG tablet  Commonly known as:  ABILIFY  Take 1 tablet (10 mg total) by mouth daily. For mood control   Indication:  Mood control     clonazePAM 0.5 MG tablet  Commonly known  as:  KLONOPIN  Take 1 tablet (0.5 mg) tablet in am & 3 tablets (1.5 mg): For anxiety   Indication:  Anxiety     liver oil-zinc oxide 40 % ointment  Commonly known as:  DESITIN  Apply topically daily. For wound care   Indication:  Wound care     mirtazapine 45 MG tablet  Commonly known as:  REMERON  Take 1 tablet (45 mg total) by mouth at bedtime. For depression/sleep   Indication:  Trouble Sleeping, Major Depressive Disorder     omeprazole 20 MG capsule  Commonly known as:  PRILOSEC  Take 1 capsule (20 mg total) by mouth daily. For acid reflux   Indication:  Gastroesophageal Reflux Disease      pravastatin 20 MG tablet  Commonly known as:  PRAVACHOL  Take 1 tablet (20 mg total) by mouth at bedtime. For high cholesterol   Indication:  Inherited Heterozygous Hypercholesterolemia     sertraline 100 MG tablet  Commonly known as:  ZOLOFT  Take 1 tablet (100 mg total) by mouth daily. For depression   Indication:  Major Depressive Disorder     tamsulosin 0.4 MG Caps capsule  Commonly known as:  FLOMAX  Take 1 capsule (0.4 mg total) by mouth daily. For urinary retension   Indication:  Urinary retension     Vitamin D (Ergocalciferol) 50000 UNITS Caps capsule  Commonly known as:  DRISDOL  Take 1 capsule (50,000 Units total) by mouth every 7 (seven) days. On Wednesday: For bone health   Indication:  Bone health       Follow-up Information   Follow up with Dr. Adele Schilder - [facility] On 08/11/2014. (Wednesday, August 26 at 1:15 PM)    Contact information:   [place], [place]   [address]  [phone]      Follow up with [facility] On 07/28/2014. (Wednesday, July 28, 2014 at 8:45 AM)    Contact information:   [address] [place], [place] [address]  [phone]     Follow-up recommendations:  Activity:  As tolerated Diet: As recommended by your primary care doctor. Keep all scheduled follow-up appointments as recommended.  Comments: Take all your medications as prescribed by your mental healthcare provider. Report any adverse effects and or reactions from your medicines to your outpatient provider promptly. Patient is instructed and cautioned to not engage in alcohol and or illegal drug use while on prescription medicines. In the event of worsening symptoms, patient is instructed to call the crisis hotline, 911 and or go to the nearest ED for appropriate evaluation and treatment of symptoms. Follow-up with your primary care provider for your other medical issues, concerns and or  health care needs.  Total Discharge Time:  Greater than 30 minutes.  Signed: Encarnacion Slates, PMHNP-BC 07/26/2014, 3:43 PM I personally assessed the patient and formulated the plan Geralyn Flash A. Sabra Heck, M.D.

## 2014-07-26 NOTE — Clinical Social Work Note (Signed)
CSW spoke with patient's to advise patient is discharging home today.  Sister aware of discharge and stated she will transport patient home.  She shared plans to have RN review medications with her as she administers medications to patient.  She was also informed patient will start [facility] on Wednesday 07/28/14 at 8:30 AM.

## 2014-07-26 NOTE — Plan of Care (Signed)
Problem: Diagnosis: Increased Risk For Suicide Attempt Goal: LTG-Patient Will Report Improved Mood and Deny Suicidal LTG (by discharge) Patient will report improved mood and deny suicidal ideation.  Outcome: Progressing Pt denies SI. Goal: STG-Patient Will Attend All Groups On The Unit Outcome: Progressing Pt attended evening group this shift.   Goal: STG-Patient Will Comply With Medication Regime Outcome: Progressing Pt was compliant with all scheduled medications so far this shift.    Problem: Ineffective individual coping Goal: STG: Patient will remain free from self harm Outcome: Progressing Pt has not harmed self this shift.    Problem: Alteration in mood Goal: LTG-Patient reports reduction in suicidal thoughts (Patient reports reduction in suicidal thoughts and is able to verbalize a safety plan for whenever patient is feeling suicidal)  Outcome: Progressing Pt denies SI this shift.   Goal: STG-Patient reports thoughts of self-harm to staff Outcome: Progressing Pt agreed to notify staff if she thinks of harming self.

## 2014-07-26 NOTE — BHH Group Notes (Signed)
[facility] LCSW Group Therapy          Overcoming Obstacles       1:15 -2:30        07/26/2014       Type of Therapy:  Group Therapy  Participation Level:  Appropriate  Participation Quality:  Appropriate  Affect:  Appropriate, Alert  Cognitive:  Attentive Appropriate  Insight: Developing/Improving Engaged  Engagement in Therapy: Developing/Imprvoing Engaged  Modes of Intervention:  Discussion Exploration  Education Rapport BuildingProblem-Solving Support  Summary of Progress/Problems:  The main focus of today's group was overcoming obstacles. She shared the obstacle she has to overcome is lack of sleep.  Patient stated she is hopeful that getting back into her home and bed will help.   [name] 07/26/2014

## 2014-07-26 NOTE — BHH Group Notes (Signed)
[facility] LCSW Aftercare Discharge Planning Group Note   07/26/2014 10:05 AM    Participation Quality:  Appropraite  Mood/Affect:  Appropriate  Depression Rating:  5  Anxiety Rating:  10 ( nervous about discharging home)  Thoughts of Suicide:  No  Will you contract for safety?   NA  Current AVH:  No  Plan for Discharge/Comments:  Patient attended discharge planning group and actively participated in group.  Patient reports having anxiety around discharging home today.  She will follow up with [facility].  Patient advised her sister will pick her up for discharge and have the RN to review discharge medications. CSW provided all participants with daily workbook.   Transportation Means: Patient has transportation.   Supports:  Patient has a support system.   Kendra Simmons, Eulas Post

## 2014-07-26 NOTE — Progress Notes (Signed)
[facility] Adult Case Management Discharge Plan :  Will you be returning to the same living situation after discharge: Yes,  Patient is returning to her home. At discharge, do you have transportation home?:Yes,  Patient will arrange transportation. Do you have the ability to pay for your medications:Yes,  Patient is able to obtain medications.  Release of information consent forms completed and in the chart;  Patient's signature needed at discharge.  Patient to Follow up at: Follow-up Information   Follow up with Dr. Adele Schilder - [facility] On 08/11/2014. (Wednesday, August 26 at 1:15 PM)    Contact information:   [place], [place]   [address]  [phone]      Follow up with [facility] On 07/28/2014. (Wednesday, July 28, 2014 at 8:45 AM)    Contact information:   [address] [place],  [address]  [phone]      Patient denies SI/HI:   Patient no longer endorsing SI/HI or other thoughts of self harm.     Safety Planning and Suicide Prevention discussed: .Reviewed with all patients during discharge planning group   Eimi Viney, Eulas Post 07/26/2014, 11:32 AM

## 2014-07-26 NOTE — Progress Notes (Signed)
Patient ID: Kendra Simmons, female   DOB: 07/09/1951, 63 y.o.   MRN: [id number] Patient denies SI and HI. Discharge instructions, medications, and follow-up care reviewed with patient and patient's sister, [name]. Discharge instructions, prescriptions, and medications given to patient. Patient and patient's sister verbalized understanding of discharge instructions and follow-up care. Patient's sister to drive her home upon discharge. Patient stated that she also plans to follow-up with her family practitioner tomorrow.

## 2014-07-26 NOTE — BHH Suicide Risk Assessment (Signed)
Suicide Risk Assessment  Discharge Assessment     Demographic Factors:  Caucasian  Total Time spent with patient: 45 minutes  Psychiatric Specialty Exam:     Blood pressure 149/74, pulse 91, temperature 97.5 F (36.4 C), temperature source Oral, resp. rate 20, height 5\' 4"  (1.626 m), weight 81.194 kg (179 lb), SpO2 98.00%.Body mass index is 30.71 kg/(m^2).  General Appearance: Fairly Groomed  Engineer, water::  Fair  Speech:  Clear and Coherent and Slow  Volume:  Decreased  Mood:  Anxious and Depressed  Affect:  sad, anxious tearful  Thought Process:  Coherent and Goal Directed  Orientation:  Full (Time, Place, and Person)  Thought Content:  symptoms, worries, concerns  Suicidal Thoughts:  No  Homicidal Thoughts:  No  Memory:  Immediate;   Fair Recent;   Fair Remote;   Fair  Judgement:  Fair  Insight:  Present  Psychomotor Activity:  Normal  Concentration:  Fair  Recall:  [employer] of Knowledge:NA  Language: Fair  Akathisia:  No  Handed:    AIMS (if indicated):     Assets:  Desire for Improvement  Sleep:  Number of Hours: 5    Musculoskeletal: Strength & Muscle Tone: within normal limits Gait & Station: normal Patient leans: N/A   Mental Status Per Nursing Assessment::   On Admission:  NA  Current Mental Status by Physician: In full contact with reality. Denies SI. States she is still not feeling well. She states she felt better with the Abilify, Seroquel combination. Realizes she could not stay on both. Still states she is not sleeping well even with the increase in Remeron. Does not want to stay any longer in the inpatient unit. States that she will probably be able to sleep better at her home. She will see her PCP tomorrow for better BP, HR control. She will ask about a beta blocker. She will be back at the [facility] IOP Wednesday for further stabilization of her mood and anxiety   Loss Factors: Loss of significant relationship and Decline in  physical health  Historical Factors: NA  Risk Reduction Factors:   Positive social support  Continued Clinical Symptoms:  Depression:   Insomnia Severe  Cognitive Features That Contribute To Risk:  Closed-mindedness Polarized thinking Thought constriction (tunnel vision)    Suicide Risk:  Minimal: No identifiable suicidal ideation.  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 High cholesterol   . Depression   . Bipolar 1 disorder   . Arthritis   . Anxiety   . GERD (gastroesophageal reflux disease)    AXIS IV:  other psychosocial or environmental problems AXIS V:  51-60 moderate symptoms  Plan Of Care/Follow-up recommendations:  Activity:  as tolerated Diet:  regular Follow up [facility] IOP/PCP Is patient on multiple antipsychotic therapies at discharge:  No   Has Patient had three or more failed trials of antipsychotic monotherapy by history:  No  Recommended Plan for Multiple Antipsychotic Therapies: NA    Elizardo Chilson A 07/26/2014, 12:56 PM

## 2014-07-27 DIAGNOSIS — R000 Tachycardia, unspecified: Secondary | ICD-10-CM | POA: Diagnosis not present

## 2014-07-27 DIAGNOSIS — R030 Elevated blood-pressure reading, without diagnosis of hypertension: Secondary | ICD-10-CM | POA: Diagnosis not present

## 2014-07-27 DIAGNOSIS — F411 Generalized anxiety disorder: Secondary | ICD-10-CM | POA: Diagnosis not present

## 2014-07-27 DIAGNOSIS — N390 Urinary tract infection, site not specified: Secondary | ICD-10-CM | POA: Diagnosis not present

## 2014-07-27 DIAGNOSIS — F329 Major depressive disorder, single episode, unspecified: Secondary | ICD-10-CM | POA: Diagnosis not present

## 2014-07-28 ENCOUNTER — Other Ambulatory Visit (HOSPITAL_COMMUNITY): Payer: Medicare Other | Attending: Psychiatry | Admitting: Psychiatry

## 2014-07-28 ENCOUNTER — Encounter (HOSPITAL_COMMUNITY): Payer: Self-pay

## 2014-07-28 VITALS — BP 150/92 | HR 64 | Resp 12

## 2014-07-28 DIAGNOSIS — F410 Panic disorder [episodic paroxysmal anxiety] without agoraphobia: Secondary | ICD-10-CM | POA: Insufficient documentation

## 2014-07-28 DIAGNOSIS — Z598 Other problems related to housing and economic circumstances: Secondary | ICD-10-CM | POA: Insufficient documentation

## 2014-07-28 DIAGNOSIS — Z609 Problem related to social environment, unspecified: Secondary | ICD-10-CM | POA: Insufficient documentation

## 2014-07-28 DIAGNOSIS — E7800 Pure hypercholesterolemia, unspecified: Secondary | ICD-10-CM | POA: Diagnosis not present

## 2014-07-28 DIAGNOSIS — F319 Bipolar disorder, unspecified: Secondary | ICD-10-CM | POA: Insufficient documentation

## 2014-07-28 DIAGNOSIS — Z5987 Material hardship due to limited financial resources, not elsewhere classified: Secondary | ICD-10-CM | POA: Insufficient documentation

## 2014-07-28 DIAGNOSIS — F332 Major depressive disorder, recurrent severe without psychotic features: Secondary | ICD-10-CM | POA: Insufficient documentation

## 2014-07-28 DIAGNOSIS — F411 Generalized anxiety disorder: Secondary | ICD-10-CM | POA: Diagnosis not present

## 2014-07-28 DIAGNOSIS — K219 Gastro-esophageal reflux disease without esophagitis: Secondary | ICD-10-CM | POA: Diagnosis not present

## 2014-07-28 DIAGNOSIS — F331 Major depressive disorder, recurrent, moderate: Secondary | ICD-10-CM

## 2014-07-28 MED ORDER — CLONAZEPAM 0.5 MG PO TABS: ORAL_TABLET | ORAL | Status: DC

## 2014-07-28 NOTE — Progress Notes (Signed)
Kendra Simmons is a 63 y.o., divorced, Caucasian, female who was transitioned from the inpatient unit; where she was admitted due to [place] and increasing depression. Pt had stated upon admission she felt weak because she hadn't been eating or drinking over the past week, other than "water and a little ice cream." Pt stated she recently changed psychiatrists. She was seeing [name] and then began seeing [name] at [facility] 7 weeks ago. Pt denies any current SI.  Also, denies HI or psychosis. Pt endorses continued symptoms of anxiety, sadness, fair appetite (had lost ten pounds before admission), feelings of hopelessness, helplessness, and poor sleep.  Pt reports that she has a longstanding hx of depression with both inpatient and outpatient treatment. Pt stated she has been to [facility] "years ago."   Denies prior suicide attempts.  Family Hx:  Father (ETOH), all three sisters (Depression and Anxiety).  Stressors:  1)  Family Conflict:  Pt stated that she is having conflict with her family, because they want her to help take care of her elderly mother. Reports conflict with current caretaker.  Pt stated she can barely take care of herself. She stated her sister dispenses her medications to her but they are not working.  Childhood:  Born in [place], [place].  Father was an alcoholic.  Reports that mother was an Firefighter. Siblings:  Three older sisters. Pt resides alone.  Has been on disability due to depression and anxiety since 1995. Denies any drugs/ETOH.  Pt completed all forms.  Scored 28 on the burns.  Pt will attend [facility]-I[facility] for ten days.  A:  Oriented pt.  Provided pt with an orientation folder.  Will refer pt to a therapist.  Pt already has an upcoming new patient appointment with Dr. Adele Schilder.  Encouraged support groups.  R:  Pt receptive.

## 2014-07-28 NOTE — Progress Notes (Signed)
    Daily Group Progress Note  Program: IOP  Group Time: 9:00-10:30  Participation Level: Active  Behavioral Response: Appropriate  Type of Therapy:  Group Therapy  Summary of Progress: Pt. Met with case manager and psychiatrist.     Group Time: 10:30-12:00  Participation Level:  Active  Behavioral Response: Appropriate  Type of Therapy: Psycho-education Group  Summary of Progress: Pt. Met with case manager and psychiatrist.  Kendra Simmons B, COUNS 

## 2014-07-28 NOTE — Progress Notes (Signed)
Patient Discharge Instructions:  Next Level Care Provider Has Access to the EMR, 07/28/14 Records provided to [facility] via [facility]/Epic access.  Patsey Berthold, 07/28/2014, 1:37 PM

## 2014-07-29 ENCOUNTER — Encounter (HOSPITAL_COMMUNITY): Payer: Self-pay | Admitting: Psychiatry

## 2014-07-29 ENCOUNTER — Other Ambulatory Visit (HOSPITAL_COMMUNITY): Payer: Medicare Other | Admitting: Psychiatry

## 2014-07-29 DIAGNOSIS — F329 Major depressive disorder, single episode, unspecified: Secondary | ICD-10-CM | POA: Diagnosis not present

## 2014-07-29 DIAGNOSIS — F332 Major depressive disorder, recurrent severe without psychotic features: Secondary | ICD-10-CM | POA: Diagnosis not present

## 2014-07-29 NOTE — Progress Notes (Signed)
Psychiatric Assessment Adult  Patient Identification:  Kendra Simmons Date of Evaluation:  07/28/14 Chief Complaint: Depression and anxiety History of Chief Complaint:   63 y.o., divorced, Caucasian, female who was transitioned from the inpatient unit; where she was admitted due to [place] and increasing depression. Pt had stated upon admission she felt weak because she hadn't been eating or drinking over the past week, other than "water and a little ice cream." Pt stated she recently changed psychiatrists. She was seeing [name] and then began seeing [name] at [facility] 7 weeks ago. States at this time her medications were changed which Tennis Must-. stabilized her.  Patient denies any current SI. Also, denies HI or psychosis. Pt endorses continued symptoms of anxiety, with panic attacks, sadness, fair appetite (had lost ten pounds before admission), feelings of hopelessness, helplessness, and poor sleep. Patient states that she has 4 cats that crying all night which disturbs her sleep further. Pt reports that she has a longstanding hx of depression with both inpatient and outpatient treatment. Pt stated she has been to [facility] "years ago." Denies prior suicide attempts.   Family Hx: Father (ETOH), all three sisters (Depression and Anxiety). Stressors: 1) Family Conflict: Pt stated that she is having conflict with her family, because they want her to help take care of her elderly mother. Reports conflict with current caretaker. Pt stated she can barely take care of herself. She stated her sister dispenses her medications to her but they are not working.  Childhood: Born in [place], [place]. Father was an alcoholic. Reports that mother was an Firefighter.  Siblings: Three older sisters.  Pt resides alone. Has been on disability due to depression and anxiety since 1995.  Denies any drugs/ETOH. Pt completed all forms. Scored 28 on the burns. Pt will attend MH-IOP for ten days. A: Oriented pt. Provided pt with  an orientation folder. Will refer pt to a therapist. Pt already has an upcoming new patient appointment with Dr. Adele Schilder. Encouraged support groups. R: Pt receptive.    because of elevated blood pressure her PCP discontinued the amlodipine and started her on a beta blocker  HPI Review of Systems Physical Exam  Depressive Symptoms: depressed mood, anhedonia, psychomotor retardation, fatigue, feelings of worthlessness/guilt, difficulty concentrating, hopelessness, anxiety, panic attacks, disturbed sleep,  (Hypo) Manic Symptoms:  None   Anxiety Symptoms: Excessive Worry:  Yes Panic Symptoms:  Yes Agoraphobia:  No Obsessive Compulsive: No  Symptoms: None, Specific Phobias:  No Social Anxiety:  Yes  Psychotic Symptoms: None   PTSD Symptoms: None Traumatic Brain Injury: No   Past Psychiatric History: Diagnosis: Depression and anxiety   Hospitalizations: Hospitalized 20 years ago for depression   Outpatient Care: Has seen [name] and then [name]   Substance Abuse Care:   Self-Mutilation:   Suicidal Attempts:   Violent Behaviors:    Past Medical History:   Past Medical History  Diagnosis Date  . High cholesterol   . Depression   . Bipolar 1 disorder   . Arthritis   . Anxiety   . GERD (gastroesophageal reflux disease)    History of Loss of Consciousness:  No Seizure History:  No Cardiac History:  No Allergies:  No Known Allergies Current Medications:  Current Outpatient Prescriptions  Medication Sig Dispense Refill  . amLODipine (NORVASC) 2.5 MG tablet Take 1 tablet (2.5 mg total) by mouth daily. For high blood pressure  30 tablet  0  . ARIPiprazole (ABILIFY) 10 MG tablet Take 1 tablet (10  mg total) by mouth daily. For mood control  30 tablet  0  . clonazePAM (KLONOPIN) 0.5 MG tablet Take 1 tablet (0.5 mg) tablet in am & 3 tablets (1.5 mg): For anxiety  90 tablet  1  . liver oil-zinc oxide (DESITIN) 40 % ointment Apply topically daily. For wound care   56.7 g  0  . mirtazapine (REMERON) 45 MG tablet Take 1 tablet (45 mg total) by mouth at bedtime. For depression/sleep  30 tablet  0  . omeprazole (PRILOSEC) 20 MG capsule Take 1 capsule (20 mg total) by mouth daily. For acid reflux      . pravastatin (PRAVACHOL) 20 MG tablet Take 1 tablet (20 mg total) by mouth at bedtime. For high cholesterol      . sertraline (ZOLOFT) 100 MG tablet Take 1 tablet (100 mg total) by mouth daily. For depression  30 tablet  0  . tamsulosin (FLOMAX) 0.4 MG CAPS capsule Take 1 capsule (0.4 mg total) by mouth daily. For urinary retension  30 capsule    . Vitamin D, Ergocalciferol, (DRISDOL) 50000 UNITS CAPS capsule Take 1 capsule (50,000 Units total) by mouth every 7 (seven) days. On Wednesday: For bone health  30 capsule     No current facility-administered medications for this visit.    Previous Psychotropic Medications:  Medication Dose   Xanax, doxepin, Seroquel, Zoloft, Abilify, Remeron, Klonopin                        Substance Abuse History in the last 12 months: Not applicable Substance Age of 1st Use Last Use Amount Specific Type  Nicotine      Alcohol      Cannabis      Opiates      Cocaine      Methamphetamines      LSD      Ecstasy      Benzodiazepines      Caffeine      Inhalants      Others:                          Medical Consequences of Substance Abuse: NA  Legal Consequences of Substance Abuse: None  Family Consequences of Substance Abuse:  none  Blackouts:  No DT's:  No Withdrawal Symptoms:  No None  Social History: Current Place of Residence: [place] of Birth:  Family Members:  Marital Status:  Divorced Children: 3  Sons:   Daughters:  Relationships:  Education:  HS Soil scientist Problems/Performance:   Religious Beliefs/Practices:  History of Abuse: none Ship broker History:  None. Legal History: None Hobbies/Interests: None  Family History:   Family History   Problem Relation Age of Onset  . Sleep apnea Father   . Alcohol abuse Father   . Diabetes Mother   . Diabetes Sister   . Diabetes Maternal Uncle   . Diabetes Cousin     Mental Status Examination/Evaluation: Objective:  Appearance: Casual  Eye Contact::  Fair  Speech:  Clear and Coherent and Normal Rate  Volume:  Normal  Mood:  Anxious, depressed  Affect:  Appropriate and Constricted  Thought Process:  Goal Directed, Linear and Logical  Orientation:  Full (Time, Place, and Person)  Thought Content:  Rumination  Suicidal Thoughts:  No  Homicidal Thoughts:  No  Judgement:  Good  Insight:  Good  Psychomotor Activity:  Normal  Akathisia:  No  Handed:  Right  AIMS (if indicated):  0 is getting cooler   Assets:  Communication Skills Desire for Improvement Physical Health Resilience Social Support    Laboratory/X-Ray Psychological Evaluation(s)         AXIS I Generalized Anxiety Disorder and Major Depression, Recurrent severe  AXIS II Cluster C Traits  AXIS III Past Medical History  Diagnosis Date  . High cholesterol   . Depression   . Bipolar 1 disorder   . Arthritis   . Anxiety   . GERD (gastroesophageal reflux disease)      AXIS IV other psychosocial or environmental problems, problems related to social environment and problems with primary support group  AXIS V 51-60 moderate symptoms   Treatment Plan/Recommendations:  Plan of Care: Start IOP   Laboratory:  Done on the inpatient unit  Psychotherapy: Group and individual therapy. Sleep hygiene was discussed with the patient   Medications:  patient will continue all her current medications at the present doses.   Routine PRN Medications:  Yes  Consultations: None   Safety Concerns:  None   Other:  Estimated length of stay 2 weeks    Erin Sons, MD 8/13/20152:03 PM

## 2014-07-29 NOTE — Progress Notes (Signed)
    Daily Group Progress Note  Program: IOP  Group Time: 9:00-10:30  Participation Level: Active  Behavioral Response: Appropriate  Type of Therapy:  Group Therapy  Summary of Progress: Pt. Shared fears related to her mother's caregiver. Pt. Was talked openly with the group and provided feedback to other group members.      Group Time: 10:30-12:00  Participation Level:  Active  Behavioral Response: Appropriate  Type of Therapy: Psycho-education Group  Summary of Progress: Pt. Participated in discussion about shame and developing resilience to shame by developing vulnerability. Pt. Watched and participated in discussion about [employer] video.  Nancie Neas, COUNS

## 2014-07-30 ENCOUNTER — Other Ambulatory Visit (HOSPITAL_COMMUNITY): Payer: Medicare Other | Admitting: Psychiatry

## 2014-07-30 ENCOUNTER — Encounter (HOSPITAL_COMMUNITY): Payer: Self-pay | Admitting: Psychiatry

## 2014-07-30 DIAGNOSIS — F331 Major depressive disorder, recurrent, moderate: Secondary | ICD-10-CM

## 2014-07-30 DIAGNOSIS — F332 Major depressive disorder, recurrent severe without psychotic features: Secondary | ICD-10-CM | POA: Diagnosis not present

## 2014-07-30 NOTE — Progress Notes (Signed)
    Daily Group Progress Note  Program: IOP  Group Time: 9:00-10:30  Participation Level: Active  Behavioral Response: Appropriate  Type of Therapy:  Group Therapy  Summary of Progress: Pt. Reported that she felt anxious yesterday and was able to focus on her breath to calm herself down. Pt. Reported that she feels anxious about taking care of her mother over the weekend.     Group Time: 10:30-12:00  Participation Level:  Active  Behavioral Response: Appropriate  Type of Therapy: Psycho-education Group  Summary of Progress: Pt. Was alert and attentive during breathing instruction. Pt. Participated in guided meditation exercise. Pt. Watched [employer] Blame video.  Nancie Neas, COUNS

## 2014-07-30 NOTE — Progress Notes (Signed)
Patient ID: Kendra Simmons, female   DOB: 03-Sep-1951, 63 y.o.   MRN: [id number] Pt seen , states that she put her cats on the porch and was able to sleep  for 6 hours, patient reports mild anxiety about being separated from her cats was reassured and was able to accept reassurance. Her blood pressure has been mildly elevated and so her doctor put her on metoprolol. Discussed this will also help decrease her anxiety and patient stated understanding. She reports that her appetite is fair mood is still anxious, denies suicidal or homicidal ideation and has no hallucinations or delusions. Patient is tolerating her medications well and will be continued on them at the present doses.

## 2014-08-02 ENCOUNTER — Encounter (HOSPITAL_COMMUNITY): Payer: Self-pay

## 2014-08-02 ENCOUNTER — Other Ambulatory Visit (HOSPITAL_COMMUNITY): Payer: Medicare Other

## 2014-08-02 ENCOUNTER — Other Ambulatory Visit (HOSPITAL_COMMUNITY): Payer: Medicare Other | Admitting: Psychiatry

## 2014-08-02 DIAGNOSIS — F332 Major depressive disorder, recurrent severe without psychotic features: Secondary | ICD-10-CM | POA: Diagnosis not present

## 2014-08-02 DIAGNOSIS — F331 Major depressive disorder, recurrent, moderate: Secondary | ICD-10-CM

## 2014-08-02 DIAGNOSIS — F411 Generalized anxiety disorder: Secondary | ICD-10-CM

## 2014-08-02 DIAGNOSIS — I498 Other specified cardiac arrhythmias: Secondary | ICD-10-CM | POA: Diagnosis not present

## 2014-08-02 DIAGNOSIS — R197 Diarrhea, unspecified: Secondary | ICD-10-CM | POA: Diagnosis not present

## 2014-08-03 ENCOUNTER — Encounter (HOSPITAL_COMMUNITY): Payer: Self-pay

## 2014-08-03 ENCOUNTER — Other Ambulatory Visit (HOSPITAL_COMMUNITY): Payer: Medicare Other | Admitting: Psychiatry

## 2014-08-03 VITALS — BP 122/80 | HR 84

## 2014-08-03 DIAGNOSIS — F331 Major depressive disorder, recurrent, moderate: Secondary | ICD-10-CM

## 2014-08-03 DIAGNOSIS — F411 Generalized anxiety disorder: Secondary | ICD-10-CM

## 2014-08-03 NOTE — Progress Notes (Signed)
Patient ID: Kendra Simmons, female   DOB: April 09, 1951, 63 y.o.   MRN: [id number] Patient was seen face-to-face today, states that her sleep is better although at times feels daily putting her cats on [place]. States that had castigating used it although sometimes it becomes to struggle especially with one cat. Patient feels more rested reports that she ruminates constantly about various things went away. Discussed relaxation techniques and thought substitution patient stated understanding and is willing to practice exercises that was discussed. Appetite is improving, mood is calmer less anxious with normal suicidal or homicidal ideation no hallucinations or delusions. Continue medications

## 2014-08-04 ENCOUNTER — Encounter (HOSPITAL_COMMUNITY): Payer: Self-pay | Admitting: Psychiatry

## 2014-08-04 ENCOUNTER — Other Ambulatory Visit (HOSPITAL_COMMUNITY): Payer: Medicare Other | Admitting: Psychiatry

## 2014-08-04 ENCOUNTER — Encounter (HOSPITAL_COMMUNITY): Payer: Self-pay

## 2014-08-04 VITALS — BP 112/72 | HR 60 | Resp 12

## 2014-08-04 DIAGNOSIS — F411 Generalized anxiety disorder: Secondary | ICD-10-CM

## 2014-08-04 DIAGNOSIS — F332 Major depressive disorder, recurrent severe without psychotic features: Secondary | ICD-10-CM | POA: Diagnosis not present

## 2014-08-04 DIAGNOSIS — F331 Major depressive disorder, recurrent, moderate: Secondary | ICD-10-CM

## 2014-08-04 NOTE — Progress Notes (Signed)
    Daily Group Progress Note  Program: IOP  Group Time: 9:00-10:30  Participation Level: Active  Behavioral Response: Appropriate  Type of Therapy:  Group Therapy  Summary of Progress: Pt. Was alert and attentive. Pt. Reported that she continues to be challenged by developing good sleeping habits because of her cats.     Group Time: 10:30-12:00  Participation Level:  Active  Behavioral Response: Appropriate  Type of Therapy: Psycho-education Group  Summary of Progress: Pt. Participated in a discussion about the development of self-care behaviors. Pt. Reported that she has developed daily practice of evening prayer.  [facility]

## 2014-08-05 ENCOUNTER — Encounter (HOSPITAL_COMMUNITY): Payer: Self-pay | Admitting: Psychiatry

## 2014-08-05 ENCOUNTER — Other Ambulatory Visit (HOSPITAL_COMMUNITY): Payer: Medicare Other | Admitting: Psychiatry

## 2014-08-05 DIAGNOSIS — F411 Generalized anxiety disorder: Secondary | ICD-10-CM

## 2014-08-05 DIAGNOSIS — F331 Major depressive disorder, recurrent, moderate: Secondary | ICD-10-CM

## 2014-08-05 DIAGNOSIS — F332 Major depressive disorder, recurrent severe without psychotic features: Secondary | ICD-10-CM | POA: Diagnosis not present

## 2014-08-05 NOTE — Progress Notes (Signed)
    Daily Group Progress Note  Program: IOP  Group Time: 9:00-10:30  Participation Level: Active  Behavioral Response: Appropriate  Type of Therapy:  Group Therapy  Summary of Progress: Pt. Reported that she was a little tired because of disturbance from her cats. Pt. Reported that she has learned to limit her stress level by being mindful of highly stimulating television shows and violent behavior. Pt. Reported that she had energy to go home and do laundry.     Group Time: 10:30-12:00  Participation Level:  Active  Behavioral Response: Appropriate  Type of Therapy: Psycho-education Group  Summary of Progress: Pt. Participated in discussion facilitated by Orpah Cobb from the mental health association.  Nancie Neas, COUNS

## 2014-08-05 NOTE — Progress Notes (Signed)
    Daily Group Progress Note  Program: IOP  Group Time: 9:00-10:30  Participation Level: Active  Behavioral Response: Appropriate  Type of Therapy:  Group Therapy  Summary of Progress: Pt. Reported that she was doing "ok". Pt. Continues to complain of sleep disturbance due to her cats. Pt. Participated in discussion about minimizing stressors in the bedroom. Pt. Considering giving one of her cats away or finding a crate for him at night.     Group Time: 10:30-12:00  Participation Level:  Active  Behavioral Response: Appropriate  Type of Therapy: Psycho-education Group  Summary of Progress: Pt. Participated in discussion about identifying cognitive distortions.  Nancie Neas, COUNS

## 2014-08-06 ENCOUNTER — Other Ambulatory Visit (HOSPITAL_COMMUNITY): Payer: Medicare Other | Admitting: Psychiatry

## 2014-08-06 ENCOUNTER — Encounter (HOSPITAL_COMMUNITY): Payer: Self-pay | Admitting: Psychiatry

## 2014-08-06 DIAGNOSIS — F332 Major depressive disorder, recurrent severe without psychotic features: Secondary | ICD-10-CM | POA: Diagnosis not present

## 2014-08-06 MED ORDER — RISPERIDONE 1 MG PO TABS: 1.0000 mg | ORAL_TABLET | Freq: Every day | ORAL | Status: DC

## 2014-08-06 NOTE — Progress Notes (Signed)
Patient ID: Kendra Simmons, female   DOB: 1951-01-16, 62 y.o.   MRN: [id number] Patient was seen face-to-face today, complains of constant rumination and anxiety and struggling to put her cats out which are resisting being put out. When she puts them out she is able to sleep well through the night although she states that she has been ruminating a lot. Wants to stop the Abilify as she believes it's not working. I discussed the rationale risks benefits options of Risperdal and patient gave me her informed consent. She'll start Risperdal 0.5 mg at bedtime and we'll decrease her Abilify to 5 mg at bedtime. Patient denies suicidal or homicidal ideation denies hallucinations or delusions. Continue other medications.

## 2014-08-06 NOTE — Progress Notes (Signed)
    Daily Group Progress Note  Program: IOP  Group Time: 9:00-10:30  Participation Level: Active  Behavioral Response: Appropriate  Type of Therapy:  Group Therapy  Summary of Progress: Pt. Reported that she continues to not sleep well because of her cats. Pt. Reported that she also is challenged by rumination. Pt. Participated in discussion about how to stop cycle of rumination.     Group Time: 10:30-12:00  Participation Level:  Active  Behavioral Response: Appropriate  Type of Therapy: Psycho-education Group  Summary of Progress: Pt. Participated in discussion about development of self-compassion 1- self-kindness, 2- shared humanity, and 3- mindfulness. Pt. Watched and discussed Marilynn Latino video.  Nancie Neas, COUNS

## 2014-08-09 ENCOUNTER — Encounter (HOSPITAL_COMMUNITY): Payer: Self-pay | Admitting: Psychiatry

## 2014-08-09 ENCOUNTER — Other Ambulatory Visit (HOSPITAL_COMMUNITY): Payer: Medicare Other | Admitting: Psychiatry

## 2014-08-09 DIAGNOSIS — F332 Major depressive disorder, recurrent severe without psychotic features: Secondary | ICD-10-CM | POA: Diagnosis not present

## 2014-08-09 DIAGNOSIS — F331 Major depressive disorder, recurrent, moderate: Secondary | ICD-10-CM

## 2014-08-09 DIAGNOSIS — F411 Generalized anxiety disorder: Secondary | ICD-10-CM

## 2014-08-09 NOTE — Progress Notes (Signed)
    Daily Group Progress Note  Program: IOP  Group Time: 9:00-10:30  Participation Level: Minimal  Behavioral Response: Appropriate  Type of Therapy:  Group Therapy  Summary of Progress: Pt. Appeared more alert. Pt. Reported that she got her hair done over the weekend and she was looking forward to getting a pedicure after group today. Pt. Reported that she continues to be challenged by heart palpitations and concerns about high blood pressure.     Group Time: 10:30-12:00  Participation Level:  Active  Behavioral Response: Appropriate  Type of Therapy: Psycho-education Group  Summary of Progress: Pt. Participated in discussion about processing guilt and understanding the purpose of our distressing emotions.  Nancie Neas, COUNS

## 2014-08-10 ENCOUNTER — Other Ambulatory Visit (HOSPITAL_COMMUNITY): Payer: Medicare Other | Admitting: Psychiatry

## 2014-08-10 ENCOUNTER — Encounter (HOSPITAL_COMMUNITY): Payer: Self-pay | Admitting: Psychiatry

## 2014-08-10 DIAGNOSIS — F331 Major depressive disorder, recurrent, moderate: Secondary | ICD-10-CM

## 2014-08-10 DIAGNOSIS — F332 Major depressive disorder, recurrent severe without psychotic features: Secondary | ICD-10-CM | POA: Diagnosis not present

## 2014-08-10 DIAGNOSIS — F411 Generalized anxiety disorder: Secondary | ICD-10-CM

## 2014-08-10 MED ORDER — RISPERIDONE 0.5 MG PO TABS: 0.5000 mg | ORAL_TABLET | Freq: Every day | ORAL | Status: DC

## 2014-08-10 MED ORDER — ARIPIPRAZOLE 5 MG PO TABS: 5.0000 mg | ORAL_TABLET | Freq: Every day | ORAL | Status: DC

## 2014-08-10 NOTE — Progress Notes (Signed)
    Daily Group Progress Note  Program: IOP  Group Time: 9:00-10:30  Participation Level: Minimal  Behavioral Response: Appropriate  Type of Therapy:  Group Therapy  Summary of Progress: Pt. Prepared for discharge today. Pt. Reports that she continues to be concerned about heart palpitations and fears about being able to manage anxiety. Pt. Reported that she is sleeping some better but continues to be interrupted by her cats and difficulty transitioning them to living outside. Pt. Processed awareness of boredom and the need to add structure and activity to her life. Pt. Discussed joining the [employer] and connecting with her friends and making improvement to her home.     Group Time: 10:30-12:00  Participation Level:  Active  Behavioral Response: Appropriate  Type of Therapy: Psycho-education Group  Summary of Progress: Pt. Participated in grief and loss group facilitated by Jeanella Craze.  Nancie Neas, COUNS

## 2014-08-10 NOTE — Progress Notes (Signed)
Discharge Note  Patient:  Kendra Simmons is an 63 y.o., female DOB:  05-18-1951  Date of Admission:  8./12/15  Date of Discharge:  08/10/14  Reason for Admission:63 y.o., divorced, Caucasian, female who was transitioned from the inpatient unit; where she was admitted due to [place] and increasing depression. Pt had stated upon admission she felt weak because she hadn't been eating or drinking over the past week, other than "water and a little ice cream." Pt stated she recently changed psychiatrists. She was seeing [name] and then began seeing [name] at [facility] 7 weeks ago. Pt denies any current SI. Also, denies HI or psychosis. Pt endorses continued symptoms of anxiety, sadness, fair appetite (had lost ten pounds before admission), feelings of hopelessness, helplessness, and poor sleep. Pt reports that she has a longstanding hx of depression with both inpatient and outpatient treatment. Pt stated she has been to [facility] "years ago." Denies prior suicide attempts. Family Hx: Father (ETOH), all three sisters (Depression and Anxiety). Stressors: 1) Family Conflict: Pt stated that she is having conflict with her family, because they want her to help take care of her elderly mother. Reports conflict with current caretaker. Pt stated she can barely take care of herself. She stated her sister dispenses her medications to her but they are not working.  Childhood: Born in [place], [place]. Father was an alcoholic. Reports that mother was an Firefighter.  Siblings: Three older sisters.  Pt resides alone. Has been on disability due to depression and anxiety since 1995.  Denies any drugs/ETOH. Pt completed all forms. Scored 28 on the burns. Pt will attend MH-IOP for ten days. A: Oriented pt. Provided pt with an orientation folder. Will refer pt to a therapist. Pt already has an upcoming new patient appointment with Dr. Adele Schilder. Encouraged support groups. R: Pt receptive.      Hospital Course: Patient  started IOP and was continued on her medications. because of elevated blood pressure her PCP started her on metoprolol. Patient had significant insomnia because her cats would keep her up. It was discussed with her that she should put them out on the porch which she began doing and was able to get 7 hours of sleep every night. Patient then stated that her anxiety was severe and she would constantly ruminates and that the Abilify was not helpful. So the Abilify was decreased to 5 mg at bedtime and Risperdal 0.5 mg was added. Patient continued to ruminate and was encouraged to give herself some time. Her sleep was good appetite was good mood was mildly anxious but she had no suicidal or homicidal ideation and no hallucinations or delusions were noted.  Mental Status at Discharge: Alert, oriented x3, affect was appropriate mood was stable mildly anxious speech and language were normal, musculoskeletal was normal. No suicidal or homicidal ideation no hallucinations or delusions.  Recent and remote memory was good, judgment and insight was good, concentration and recall was good. She was coping well and tolerating her medications  Lab Results: No results found for this or any previous visit (from the past 48 hour(s)).  Current outpatient prescriptions:ARIPiprazole (ABILIFY) 5 MG tablet, Take 1 tablet (5 mg total) by mouth daily. For mood control, Disp: 30 tablet, Rfl: 0;  clonazePAM (KLONOPIN) 0.5 MG tablet, Take 1 tablet (0.5 mg) tablet in am & 3 tablets (1.5 mg): For anxiety, Disp: 90 tablet, Rfl: 1;  liver oil-zinc oxide (DESITIN) 40 % ointment, Apply topically daily. For wound care, Disp: 56.7  g, Rfl: 0 mirtazapine (REMERON) 45 MG tablet, Take 1 tablet (45 mg total) by mouth at bedtime. For depression/sleep, Disp: 30 tablet, Rfl: 0;  omeprazole (PRILOSEC) 20 MG capsule, Take 1 capsule (20 mg total) by mouth daily. For acid reflux, Disp: , Rfl: ;  pravastatin (PRAVACHOL) 20 MG tablet, Take 1 tablet (20 mg  total) by mouth at bedtime. For high cholesterol, Disp: , Rfl:  risperiDONE (RISPERDAL) 0.5 MG tablet, Take 1 tablet (0.5 mg total) by mouth at bedtime., Disp: 30 tablet, Rfl: 0;  sertraline (ZOLOFT) 100 MG tablet, Take 1 tablet (100 mg total) by mouth daily. For depression, Disp: 30 tablet, Rfl: 0;  tamsulosin (FLOMAX) 0.4 MG CAPS capsule, Take 1 capsule (0.4 mg total) by mouth daily. For urinary retension, Disp: 30 capsule, Rfl:  Vitamin D, Ergocalciferol, (DRISDOL) 50000 UNITS CAPS capsule, Take 1 capsule (50,000 Units total) by mouth every 7 (seven) days. On Wednesday: For bone health, Disp: 30 capsule, Rfl:   Axis Diagnosis:   Axis I: Generalized Anxiety Disorder and Major Depression, Recurrent severe Axis II: Cluster C Traits Axis III:  Past Medical History  Diagnosis Date  . High cholesterol   . Depression   . Bipolar 1 disorder   . Arthritis   . Anxiety   . GERD (gastroesophageal reflux disease)    Axis IV: economic problems, problems related to social environment and problems with primary support group Axis V: 61-70 mild symptoms   Level of Care:  OP  Discharge destination:  Home  Is patient on multiple antipsychotic therapies at discharge:  Yes   Has Patient had three or more failed trials of antipsychotic monotherapy by history:  No  Patient phone:  [phone] (home)  Patient address:   [place] [address],   Follow-up recommendations:  Activity:  As tolerated Diet:  Regular Other:  Followup for medications withDr Arfeen   Comments:  None  The patient received suicide prevention pamphlet:  Yes   Erin Sons 08/10/2014, 1:02 PM

## 2014-08-10 NOTE — Patient Instructions (Signed)
Patient completed MH-IOP today.  Will follow up with Dr. Adele Schilder on 08-11-14 @ 1:15pm and [name], LCSW on 08-18-14 @ 10 a.m..  Encouraged support groups.

## 2014-08-10 NOTE — Progress Notes (Signed)
Kendra Simmons is a 63 y.o. divorced, Caucasian, female who was transitioned from the inpatient unit; where she was admitted due to [place] and increasing depression. Pt had stated upon admission she felt weak because she hadn't been eating or drinking over the past week, other than "water and a little ice cream." Pt stated she recently changed psychiatrists. She was seeing [name] and then began seeing [name] at [facility] 7 weeks ago. Pt denied any current SI. Also, denies HI or psychosis. Pt endorsed continued symptoms of anxiety, sadness, fair appetite (had lost ten pounds before admission), feelings of hopelessness, helplessness, and poor sleep. Pt reported that she has a longstanding hx of depression with both inpatient and outpatient treatment. Pt stated she has been to [facility] "years ago." Denied prior suicide attempts. Family Hx: Father (ETOH), all three sisters (Depression and Anxiety). Stressors: 1) Family Conflict: Pt stated that she is having conflict with her family, because they want her to help take care of her elderly mother. Reports conflict with current caretaker. Pt stated she can barely take care of herself. She stated her sister dispenses her medications to her but they are not working.  Pt completed MH-IOP today.  Reports not feeling any better.  "If anything I feel worse in some areas (heart palpitations and poor sleep).  Denies SI/HI or A/V hallucinations.  States that the groups were somewhat helpful. A:  D/C pt today.  F/U with Dr. Adele Schilder on 08-11-14 @ 1:15 pm and [name], LCSW on 08-18-14 @ 10 a.m.  Encouraged support groups.  R:  Pt receptive.

## 2014-08-10 NOTE — Addendum Note (Signed)
Addended by: Erin Sons D on: 08/10/2014 12:28 PM   Modules accepted: Orders

## 2014-08-11 ENCOUNTER — Encounter (HOSPITAL_COMMUNITY): Payer: Self-pay | Admitting: Psychiatry

## 2014-08-11 ENCOUNTER — Other Ambulatory Visit (HOSPITAL_COMMUNITY): Payer: Medicare Other

## 2014-08-11 ENCOUNTER — Ambulatory Visit (INDEPENDENT_AMBULATORY_CARE_PROVIDER_SITE_OTHER): Payer: Medicare Other | Admitting: Psychiatry

## 2014-08-11 VITALS — BP 108/70 | HR 55 | Ht 64.5 in | Wt 187.0 lb

## 2014-08-11 DIAGNOSIS — F339 Major depressive disorder, recurrent, unspecified: Secondary | ICD-10-CM

## 2014-08-11 DIAGNOSIS — F4310 Post-traumatic stress disorder, unspecified: Secondary | ICD-10-CM

## 2014-08-11 DIAGNOSIS — F331 Major depressive disorder, recurrent, moderate: Secondary | ICD-10-CM

## 2014-08-11 DIAGNOSIS — F09 Unspecified mental disorder due to known physiological condition: Secondary | ICD-10-CM

## 2014-08-11 MED ORDER — MIRTAZAPINE 45 MG PO TABS: 45.0000 mg | ORAL_TABLET | Freq: Every day | ORAL | Status: DC

## 2014-08-11 MED ORDER — SERTRALINE HCL 100 MG PO TABS: 100.0000 mg | ORAL_TABLET | Freq: Every day | ORAL | Status: DC

## 2014-08-11 MED ORDER — ARIPIPRAZOLE 10 MG PO TABS: 10.0000 mg | ORAL_TABLET | Freq: Every day | ORAL | Status: DC
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
## 2014-08-12 ENCOUNTER — Other Ambulatory Visit (HOSPITAL_COMMUNITY): Payer: Medicare Other

## 2014-08-13 ENCOUNTER — Other Ambulatory Visit (HOSPITAL_COMMUNITY): Payer: Medicare Other

## 2014-08-16 ENCOUNTER — Other Ambulatory Visit (HOSPITAL_COMMUNITY): Payer: Medicare Other

## 2014-08-16 DIAGNOSIS — R350 Frequency of micturition: Secondary | ICD-10-CM | POA: Diagnosis not present

## 2014-08-16 DIAGNOSIS — R209 Unspecified disturbances of skin sensation: Secondary | ICD-10-CM | POA: Diagnosis not present

## 2014-08-17 ENCOUNTER — Other Ambulatory Visit (HOSPITAL_COMMUNITY): Payer: Medicare Other

## 2014-08-18 ENCOUNTER — Ambulatory Visit (INDEPENDENT_AMBULATORY_CARE_PROVIDER_SITE_OTHER): Payer: Medicare Other | Admitting: Licensed Clinical Social Worker

## 2014-08-18 ENCOUNTER — Other Ambulatory Visit (HOSPITAL_COMMUNITY): Payer: Medicare Other

## 2014-08-18 DIAGNOSIS — F4310 Post-traumatic stress disorder, unspecified: Secondary | ICD-10-CM | POA: Diagnosis not present
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
## 2014-08-19 ENCOUNTER — Other Ambulatory Visit (HOSPITAL_COMMUNITY): Payer: Medicare Other

## 2014-08-20 ENCOUNTER — Other Ambulatory Visit (HOSPITAL_COMMUNITY): Payer: Medicare Other

## 2014-08-24 ENCOUNTER — Other Ambulatory Visit (HOSPITAL_COMMUNITY): Payer: Medicare Other

## 2014-08-25 ENCOUNTER — Other Ambulatory Visit (HOSPITAL_COMMUNITY): Payer: Medicare Other

## 2014-08-26 ENCOUNTER — Other Ambulatory Visit (HOSPITAL_COMMUNITY): Payer: Medicare Other

## 2014-08-27 ENCOUNTER — Encounter (INDEPENDENT_AMBULATORY_CARE_PROVIDER_SITE_OTHER): Payer: Self-pay

## 2014-08-27 ENCOUNTER — Other Ambulatory Visit (HOSPITAL_COMMUNITY): Payer: Medicare Other

## 2014-08-27 ENCOUNTER — Encounter: Payer: Self-pay | Admitting: Diagnostic Neuroimaging

## 2014-08-27 ENCOUNTER — Ambulatory Visit (INDEPENDENT_AMBULATORY_CARE_PROVIDER_SITE_OTHER): Payer: Medicare Other | Admitting: Diagnostic Neuroimaging

## 2014-08-27 VITALS — BP 125/79 | HR 56 | Ht 64.0 in | Wt 178.0 lb

## 2014-08-27 DIAGNOSIS — R209 Unspecified disturbances of skin sensation: Secondary | ICD-10-CM

## 2014-08-27 DIAGNOSIS — G4730 Sleep apnea, unspecified: Secondary | ICD-10-CM | POA: Diagnosis not present

## 2014-08-27 DIAGNOSIS — G609 Hereditary and idiopathic neuropathy, unspecified: Secondary | ICD-10-CM | POA: Diagnosis not present

## 2014-08-27 DIAGNOSIS — R200 Anesthesia of skin: Secondary | ICD-10-CM

## 2014-08-27 NOTE — Progress Notes (Signed)
[facility]  PATIENT: Kendra Simmons DOB: [birth year]  REFERRING CLINICIAN:  HISTORY FROM: patient  REASON FOR VISIT: new consult   HISTORICAL  CHIEF COMPLAINT:  Chief Complaint  Patient presents with  . Numbness    feet, bilateral    HISTORY OF PRESENT ILLNESS:   NEW HPI 08/27/14: 63 year old female here for evaluation of numbness in feet. Previous evaluated for memory and mood issues. Past 1-2 months patient has had gradual onset, progressive numbness and tingling in the toes and feet. Symptoms are symmetric. No pain. No burning or pins and needles. She feels a mild annoying numbness sensation. No low back pain. No proximal symptoms. No symptoms in her hands.  UPDATE 01/04/14: Doing well. No new issues. Memory and mood issues are stable. She is concerned about cost of namenda (> $300 per month) and she cannot afford it. Also, she tells me that her dementia diagnosis is not clear, and that she was told that her memory issues are related to mood and pain issues.   PRIOR HPI (03/20/12, Dr. Erling Cruz): 63 year old right-handed white single female with a 4-1/2 year history of falls causing left elbow fracture 12/13/06. She is being followed by Dr. Caryl Comes and has been evaluated with a tilt table. She fell 05/23/10. She does not have syncope with falls. There is no warning. At times she feels sluggish before falling. She denies bowel or bladder incontinence. Workup in the hospital for falls has included CT scans of the brain 12/13/06 and 09/01/07 showing bifrontal atrophy. MRI study of the brain 01/12/10 shows increased hyperintensity on T2 study of the  bilateral petrous apices representing pneumatized bone. The MRI also shows focal atrophy. She has vitamin B12 deficiency with methylmalonic acid of 207 homocystine 14.7 on 01/18/10. Other studies have shown vitamin D deficiency. RPR nonreactive, B12 254, cortisol 12.2, TSH 1.93, Cholesterol 267,HDL 51, LDL 90, CBC normal, and CMP normal.  She denies a family history of dementia. She has a history of depression previously followed by [name] but is now going to the  [facility] .She is sleeping much better, feels much better, and has increased appetite. She is not having hallucinations. She is not having headaches, visual loss, double vision, or swallowing problems. She is able to live by herself and  drives a car which her sister says has been safe. The patient states "my head is clear" though I still have problems sleeping at night.08/29/2012= MMSE 25/30. Clock drawing task 4/4. Animal  fluency task 13. Ron Parker Index of independence in activities of daily living 6.  Instrumental  activities of daily living scale 7.  Neuropsychiatric inventory one for hallucinations, 5 for agitation, 6 for depression, 6 for anxiety, 5 for disinhibition,  5 for irritability, 8 for night time behaviors, and 6 for appetite change. Geriatric depression scale 7/15.  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 CBC, CMP, TSH  were normal except for slightly elevated liver function tests and  TSH 8.27 on 07/17/2011. She is doing well on Namenda. Her only complaint is lower jaw pain from a dry socket at tooth #18. She is concerned about weight gain on Abilify.  REVIEW OF SYSTEMS: Full 14 system review of systems performed and notable only for numbness depression anxiety not enough sleep racing thoughts.    ALLERGIES: No Known Allergies  HOME MEDICATIONS: Outpatient Prescriptions Prior to Visit  Medication Sig Dispense Refill  . ARIPiprazole (ABILIFY) 10 MG  tablet Take 1 tablet (10 mg total) by mouth daily. For mood control  30 tablet  0  . clonazePAM (KLONOPIN) 0.5 MG tablet Take 1 tablet (0.5 mg) tablet in am & 3 tablets (1.5 mg): For anxiety  90 tablet  1  . liver oil-zinc oxide (DESITIN) 40 % ointment Apply topically daily. For wound care  56.7 g  0  . metoprolol tartrate (LOPRESSOR) 25 MG  tablet Take 25 mg by mouth 2 (two) times daily.      . mirtazapine (REMERON) 45 MG tablet Take 1 tablet (45 mg total) by mouth at bedtime. For depression/sleep  30 tablet  0  . omeprazole (PRILOSEC) 20 MG capsule Take 1 capsule (20 mg total) by mouth daily. For acid reflux      . pravastatin (PRAVACHOL) 20 MG tablet Take 1 tablet (20 mg total) by mouth at bedtime. For high cholesterol      . sertraline (ZOLOFT) 100 MG tablet Take 1 tablet (100 mg total) by mouth daily. For depression  30 tablet  0  . tamsulosin (FLOMAX) 0.4 MG CAPS capsule Take 1 capsule (0.4 mg total) by mouth daily. For urinary retension  30 capsule    . Vitamin D, Ergocalciferol, (DRISDOL) 50000 UNITS CAPS capsule Take 1 capsule (50,000 Units total) by mouth every 7 (seven) days. On Wednesday: For bone health  30 capsule     No facility-administered medications prior to visit.    PAST MEDICAL HISTORY: Past Medical History  Diagnosis Date  . High cholesterol   . Depression   . Bipolar 1 disorder   . Arthritis   . Anxiety   . GERD (gastroesophageal reflux disease)     PAST SURGICAL HISTORY: Past Surgical History  Procedure Laterality Date  . Cosmetic surgery    . Cesarean section    . Laproscopy      FAMILY HISTORY: Family History  Problem Relation Age of Onset  . Sleep apnea Father   . Alcohol abuse Father   . Diabetes Mother   . Diabetes Sister   . Diabetes Maternal Uncle   . Diabetes Cousin     SOCIAL HISTORY:  History   Social History  . Marital Status: Divorced    Spouse Name: N/A    Number of Children: 3  . Years of Education: 14   Occupational History  . Retired    Social History Main Topics  . Smoking status: Never Smoker   . Smokeless tobacco: Never Used  . Alcohol Use: No  . Drug Use: No  . Sexual Activity: No   Other Topics Concern  . Not on file   Social History Narrative   Patient lives at home alone.   Caffeine Use: none     PHYSICAL EXAM  Filed Vitals:    08/27/14 1121  BP: 125/79  Pulse: 56  Height: 5\' 4"  (1.626 m)  Weight: 178 lb (80.74 kg)    Not recorded    Body mass index is 30.54 kg/(m^2).  GENERAL EXAM: Patient is in no distress; well developed, nourished and groomed; neck is supple  CARDIOVASCULAR: Regular rate and rhythm, no murmurs, no carotid bruits  NEUROLOGIC: MENTAL STATUS: awake, alert, oriented to person, place and time, recent and remote memory intact, normal attention and concentration, language fluent, comprehension intact, naming intact, fund of knowledge appropriate CRANIAL NERVE: no papilledema on fundoscopic exam, pupils equal and reactive to light, visual fields full to confrontation, extraocular muscles intact, no nystagmus, facial sensation and strength symmetric,  hearing intact, palate elevates symmetrically, uvula midline, shoulder shrug symmetric, tongue midline. MOTOR:  Normal bulk and tone, full strength in the BUE, BLE SENSORY: SLIGHTLY DECR TO PP IN TOES; RIGHT TOE VIB 7 SEC; LEFT TOE VIB 10 SEC. COORDINATION: finger-nose-finger, fine finger movements, heel-shin normal REFLEXES: BUE 2, KNEES 1, RIGHT ANKLE 1, LEFT ANKLE 0. DOWN GOING TOES GAIT/STATION: narrow based gait; ROMBERG NEG.    DIAGNOSTIC DATA (LABS, IMAGING, TESTING) - I reviewed patient records, labs, notes, testing and imaging myself where available.  Lab Results  Component Value Date   WBC 5.9 07/10/2014   HGB 12.8 07/10/2014   HCT 38.3 07/10/2014   MCV 90.1 07/10/2014   PLT 174 07/10/2014      Component Value Date/Time   NA 140 07/10/2014 1800   K 3.8 07/10/2014 1800   CL 102 07/10/2014 1800   CO2 26 07/10/2014 1800   GLUCOSE 130* 07/15/2014 0623   BUN 13 07/10/2014 1800   CREATININE 0.80 07/10/2014 1800   CALCIUM 10.0 07/10/2014 1800   PROT 7.3 07/10/2014 1800   ALBUMIN 4.2 07/10/2014 1800   AST 21 07/10/2014 1800   ALT 10 07/10/2014 1800   ALKPHOS 65 07/10/2014 1800   BILITOT 0.4 07/10/2014 1800   GFRNONAA 77* 07/10/2014 1800   GFRAA  89* 07/10/2014 1800   Lab Results  Component Value Date   CHOL 162 07/20/2014   Lab Results  Component Value Date   HGBA1C 6.3* 07/13/2014   No results found for this basename: VITAMINB12   No results found for this basename: TSH    I reviewed images myself and agree with interpretation. -VRP  08/15/11 MRI brain - Non-specific enlargement of the subarachnoid spaces over the bifrontal region, without definite underlying cortical atrophy.  02/06/14 CT head / cervical spine - Generalized atrophy. No acute intracranial abnormalities. Mild scattered degenerative disc and facet disease changes of the cervical spine. No acute cervical spine abnormalities.    ASSESSMENT AND PLAN  63 y.o. year old female here with mild numbness in feet. Last A1c 6.3. Could be small fiber neuropathy from borderline diabetes.   PLAN: - Monitor blood sugar. Focus on health nutrition and exercise.  - EMG to rule out demyelinating neuropathy.  Return for for NCV/EMG.    Penni Bombard, MD 1/79/1505, 69:79 PM Certified in Neurology, Neurophysiology and Neuroimaging  [facility] [address], [place], [place] [address] [phone]

## 2014-08-27 NOTE — Patient Instructions (Signed)
Monitor blood sugar. Focus on health nutrition and exercise.

## 2014-08-30 ENCOUNTER — Telehealth: Payer: Self-pay | Admitting: Neurology

## 2014-08-30 ENCOUNTER — Other Ambulatory Visit (HOSPITAL_COMMUNITY): Payer: Medicare Other

## 2014-08-30 NOTE — Telephone Encounter (Signed)
Dr. Andrey Spearman ,refers patient for attended sleep study.  Height: 5'4"  Weight: 178lbs  BMI: 30.54   Past Medical History:  High cholesterol  .  Depression  .  Bipolar 1 disorder  .  Arthritis  .  Anxiety  .  GERD (gastroesophageal reflux disease)    Sleep Symptoms: I still have problems sleeping at night.08/29/2012, depression, anxiety not enough sleep, racing thoughts.    Epworth Score: NO ESS listed   Medication:  ARIPiprazole (ABILIFY) 10 MG tablet  Take 1 tablet (10 mg total) by mouth daily. For mood control  30 tablet  0  .  clonazePAM (KLONOPIN) 0.5 MG tablet  Take 1 tablet (0.5 mg) tablet in am & 3 tablets (1.5 mg): For anxiety  90 tablet  1  .  liver oil-zinc oxide (DESITIN) 40 % ointment  Apply topically daily. For wound care  56.7 g  0  .  metoprolol tartrate (LOPRESSOR) 25 MG tablet  Take 25 mg by mouth 2 (two) times daily.  .  mirtazapine (REMERON) 45 MG tablet  Take 1 tablet (45 mg total) by mouth at bedtime. For depression/sleep  30 tablet  0  .  omeprazole (PRILOSEC) 20 MG capsule  Take 1 capsule (20 mg total) by mouth daily. For acid reflux  .  pravastatin (PRAVACHOL) 20 MG tablet  Take 1 tablet (20 mg total) by mouth at bedtime. For high cholesterol  .  sertraline (ZOLOFT) 100 MG tablet  Take 1 tablet (100 mg total) by mouth daily. For depression  30 tablet  0  .  tamsulosin (FLOMAX) 0.4 MG CAPS capsule  Take 1 capsule (0.4 mg total) by mouth daily. For urinary retension  30 capsule  .  Vitamin D, Ergocalciferol, (DRISDOL) 50000 UNITS CAPS capsule  Take 1 capsule (50,000 Units total) by mouth every 7 (seven) days. On Wednesday: For bone health  30 capsule     Ins: MEDICARE   Assessment & Plan: Sleep Study for unspecified sleep apnea  Please review patient information and submit instructions for scheduling and orders for sleep technologist. Thank you.

## 2014-08-30 NOTE — Telephone Encounter (Signed)
,  refers patient for attended sleep study.  Height:  Weight:  BMI:  Past Medical History:  Sleep Symptoms:   Epworth Score:   Medication:   Ins:   Assessment & Plan:  Please review patient information and submit instructions for scheduling and orders for sleep technologist. Thank you.

## 2014-08-31 ENCOUNTER — Ambulatory Visit (HOSPITAL_COMMUNITY): Payer: Self-pay | Admitting: Licensed Clinical Social Worker

## 2014-08-31 ENCOUNTER — Other Ambulatory Visit (HOSPITAL_COMMUNITY): Payer: Medicare Other

## 2014-08-31 NOTE — Telephone Encounter (Signed)
Please get an EPWORTH score and ask about snoring, choking , waking up SOB.  There is not enough here to justify a sleep study that indicates organic sleep disorder.

## 2014-09-01 ENCOUNTER — Other Ambulatory Visit (HOSPITAL_COMMUNITY): Payer: Medicare Other

## 2014-09-02 ENCOUNTER — Other Ambulatory Visit (HOSPITAL_COMMUNITY): Payer: Medicare Other

## 2014-09-03 ENCOUNTER — Other Ambulatory Visit (HOSPITAL_COMMUNITY): Payer: Medicare Other

## 2014-09-06 ENCOUNTER — Other Ambulatory Visit (HOSPITAL_COMMUNITY): Payer: Medicare Other

## 2014-09-06 NOTE — Telephone Encounter (Signed)
Called and spoke to patient regarding the additional questions you had she stated that she does snore, but she doesn't have choking or waking up SOB. I went over the ESS and she scored a (0). Patient stated she is unable to nap during the day due to anti-depressant she is taking. Patient had a sleep study almost 9 years ago and was told at that time she had sleep apnea.

## 2014-09-07 ENCOUNTER — Encounter (HOSPITAL_COMMUNITY): Payer: Self-pay | Admitting: Psychiatry

## 2014-09-07 ENCOUNTER — Other Ambulatory Visit (HOSPITAL_COMMUNITY): Payer: Medicare Other

## 2014-09-07 ENCOUNTER — Ambulatory Visit (INDEPENDENT_AMBULATORY_CARE_PROVIDER_SITE_OTHER): Payer: Medicare Other | Admitting: Psychiatry

## 2014-09-07 VITALS — BP 118/75 | HR 73 | Ht 64.0 in | Wt 172.0 lb

## 2014-09-07 DIAGNOSIS — F339 Major depressive disorder, recurrent, unspecified: Secondary | ICD-10-CM

## 2014-09-07 DIAGNOSIS — F09 Unspecified mental disorder due to known physiological condition: Secondary | ICD-10-CM | POA: Diagnosis not present

## 2014-09-07 DIAGNOSIS — F4310 Post-traumatic stress disorder, unspecified: Secondary | ICD-10-CM

## 2014-09-07 DIAGNOSIS — F331 Major depressive disorder, recurrent, moderate: Secondary | ICD-10-CM

## 2014-09-07 MED ORDER — SERTRALINE HCL 100 MG PO TABS: ORAL_TABLET | ORAL | Status: DC

## 2014-09-07 MED ORDER — ARIPIPRAZOLE 10 MG PO TABS: 10.0000 mg | ORAL_TABLET | Freq: Every day | ORAL | Status: DC

## 2014-09-07 MED ORDER — MIRTAZAPINE 30 MG PO TABS
30.0000 mg | ORAL_TABLET | Freq: Every day | ORAL | Status: DC
Start: 2014-09-07 — End: 2014-10-05

## 2014-09-07 NOTE — Telephone Encounter (Signed)
This patient has a diagnosis of obstructive sleep apnea from a sleep study performed at [facility] in 2004.  Has a CPAP machine but discontinued use.  She sleeps alone, but her snoring has progressed to the point that she can hear herself and it is causing her to awaken (likely experiencing apneic episodes), and complaints of significant fatigue.  This will qualify her for an attended sleep study.  She will however need a face to face visit.  Although Dr. Leta Baptist is referring the patient, he did not address her sleep related complaints during the recent office visit, only submitted a referral.  Medicare will base their coverage on medical necessity which should be addressed during the clinic visit - a phone note is not valid.  Please advise to bring her in for a face to face visit.   Latricia, please schedule a consultation when you have received Dr. Edwena Felty approval to do so.

## 2014-09-07 NOTE — Progress Notes (Signed)
[place] [phone] Progress Note   Kendra Simmons [id number] 63 y.o.  09/07/2014 2:59 PM  Chief Complaint:  I am not sleeping well.  I wanted to try me any medication for sleep.    History of Present Illness:  Kendra Simmons came for her followup appointment.  She was seen as initial evaluation on August 26.  She was referred from intensive outpatient program.  She was taking 2 antipsychotic medication.  She was recommended to stop Risperdal and increase Abilify which had helped her in the past.  Patient mentioned some improvement in her depression but she is very concerned about lack of sleep.  She is taking Remeron and Zoloft.  She continues to have nightmares and flashback.  She still feels that her energy , chronic feeling of hopelessness , rumination and fatigue.  She is reporting 3-4 hours sleep.  She has lost weight from the past but she believes because of lack of appetite.  However she denies any active or passive suicidal thoughts or homicidal thoughts.  She supposed to see therapist in this office however due to a conflict with another provider she was unable to come for the appointment.  Patient denies any tremors or shakes.  The patient was seen neurologist this month because of neuropathy and numbness.  She is scheduled to have EMG.  Patient also had cognitive impairment and sometimes she do not remember things very well.  She is feeling sad today because her mother has diagnosed with cancer.  She is taking Klonopin 0.5 mg in the morning and 1.5 mg at bedtime.  She wants to add more benzodiazepine and sleeping aid (Belsorma).  Patient denies any hallucination or any paranoia.  Patient lives by herself.  She's been married for 25 years.  She has 3 children.  Patient has limited contact with her children.  Suicidal Ideation: No Plan Formed: No Patient has means to carry out plan: No  Homicidal Ideation: No Plan Formed: No Patient has means to carry out plan: No   Past  Psychiatric History/Hospitalization(s) Patient has at least 3 psychiatric hospitalizations.  She was admitted at [facility] and recently to [facility].  She denies any history of suicidal attempt.  Patient has history of hallucinations, paranoia and severe depression.  In the past she has seen Kendra Simmons, Dr Kendra Simmons and Dr Kendra Simmons.  She has done at least once intensive outpatient program.  As per chart she has taken in the past doxepin, Xanax, Seroquel, amitriptyline, imipramine, Wellbutrin, Prozac however she do not remember the details. Anxiety: Yes Bipolar Disorder: No Depression: Yes Mania: No Psychosis: History of hallucinations Schizophrenia: No Personality Disorder: No Hospitalization for psychiatric illness: Yes History of Electroconvulsive Shock Therapy: No Prior Suicide Attempts: No  Medical History; Patient has high cholesterol, memory impairment, GERD and palpitation.  She has history of frequent falls and seen by a neurologist in the past.  Her primary care physician is Dr. Rex Simmons in [place].  Review of Systems: Psychiatric: Agitation: No Hallucination: No Depressed Mood: Yes Insomnia: No Hypersomnia: No Altered Concentration: No Feels Worthless: Yes Grandiose Ideas: No Belief In Special Powers: No New/Increased Substance Abuse: No Compulsions: No  Neurologic: Headache: No Seizure: No Paresthesias: No   Musculoskeletal: Strength & Muscle Tone: within normal limits Gait & Station: normal Patient leans: N/A   Outpatient Encounter Prescriptions as of 09/07/2014  Medication Sig  . ARIPiprazole (ABILIFY) 10 MG tablet Take 1 tablet (10 mg total) by mouth daily. For mood  control  . clonazePAM (KLONOPIN) 0.5 MG tablet Take 1 tablet (0.5 mg) tablet in am & 3 tablets (1.5 mg): For anxiety  . liver oil-zinc oxide (DESITIN) 40 % ointment Apply topically daily. For wound care  . metoprolol tartrate (LOPRESSOR) 25 MG tablet Take 25 mg by mouth  2 (two) times daily.  . mirtazapine (REMERON) 30 MG tablet Take 1 tablet (30 mg total) by mouth at bedtime. For depression/sleep  . omeprazole (PRILOSEC) 20 MG capsule Take 1 capsule (20 mg total) by mouth daily. For acid reflux  . pravastatin (PRAVACHOL) 20 MG tablet Take 1 tablet (20 mg total) by mouth at bedtime. For high cholesterol  . sertraline (ZOLOFT) 100 MG tablet Take 1 and 1/2 tab daily  . tamsulosin (FLOMAX) 0.4 MG CAPS capsule Take 1 capsule (0.4 mg total) by mouth daily. For urinary retension  . Vitamin D, Ergocalciferol, (DRISDOL) 50000 UNITS CAPS capsule Take 1 capsule (50,000 Units total) by mouth every 7 (seven) days. On Wednesday: For bone health  . [DISCONTINUED] ARIPiprazole (ABILIFY) 10 MG tablet Take 1 tablet (10 mg total) by mouth daily. For mood control  . [DISCONTINUED] mirtazapine (REMERON) 45 MG tablet Take 1 tablet (45 mg total) by mouth at bedtime. For depression/sleep  . [DISCONTINUED] sertraline (ZOLOFT) 100 MG tablet Take 1 tablet (100 mg total) by mouth daily. For depression    Recent Results (from the past 2160 hour(s))  URINALYSIS, ROUTINE W REFLEX MICROSCOPIC     Status: Abnormal   Collection Time    07/10/14  3:52 PM      Result Value Ref Range   Color, Urine YELLOW  YELLOW   APPearance CLOUDY (*) CLEAR   Specific Gravity, Urine 1.021  1.005 - 1.030   pH 6.0  5.0 - 8.0   Glucose, UA NEGATIVE  NEGATIVE mg/dL   Hgb urine dipstick NEGATIVE  NEGATIVE   Bilirubin Urine NEGATIVE  NEGATIVE   Ketones, ur NEGATIVE  NEGATIVE mg/dL   Protein, ur NEGATIVE  NEGATIVE mg/dL   Urobilinogen, UA 0.2  0.0 - 1.0 mg/dL   Nitrite NEGATIVE  NEGATIVE   Leukocytes, UA SMALL (*) NEGATIVE  URINE MICROSCOPIC-ADD ON     Status: Abnormal   Collection Time    07/10/14  3:52 PM      Result Value Ref Range   Squamous Epithelial / LPF FEW (*) RARE   WBC, UA 11-20  <3 WBC/hpf   RBC / HPF 0-2  <3 RBC/hpf   Bacteria, UA RARE  RARE   Urine-Other MUCOUS PRESENT    URINE RAPID  DRUG SCREEN (HOSP PERFORMED)     Status: Abnormal   Collection Time    07/10/14  3:53 PM      Result Value Ref Range   Opiates NONE DETECTED  NONE DETECTED   Cocaine NONE DETECTED  NONE DETECTED   Benzodiazepines POSITIVE (*) NONE DETECTED   Amphetamines NONE DETECTED  NONE DETECTED   Tetrahydrocannabinol NONE DETECTED  NONE DETECTED   Barbiturates NONE DETECTED  NONE DETECTED   Comment:            DRUG SCREEN FOR MEDICAL PURPOSES     ONLY.  IF CONFIRMATION IS NEEDED     FOR ANY PURPOSE, NOTIFY LAB     WITHIN 5 DAYS.                LOWEST DETECTABLE LIMITS     FOR URINE DRUG SCREEN     Drug Class  Cutoff (ng/mL)     Amphetamine      1000     Barbiturate      200     Benzodiazepine   505     Tricyclics       697     Opiates          300     Cocaine          300     THC              50  CBC     Status: None   Collection Time    07/10/14  6:00 PM      Result Value Ref Range   WBC 5.9  4.0 - 10.5 K/uL   RBC 4.25  3.87 - 5.11 MIL/uL   Hemoglobin 12.8  12.0 - 15.0 g/dL   HCT 38.3  36.0 - 46.0 %   MCV 90.1  78.0 - 100.0 fL   MCH 30.1  26.0 - 34.0 pg   MCHC 33.4  30.0 - 36.0 g/dL   RDW 13.2  11.5 - 15.5 %   Platelets 174  150 - 400 K/uL  COMPREHENSIVE METABOLIC PANEL     Status: Abnormal   Collection Time    07/10/14  6:00 PM      Result Value Ref Range   Sodium 140  137 - 147 mEq/L   Potassium 3.8  3.7 - 5.3 mEq/L   Chloride 102  96 - 112 mEq/L   CO2 26  19 - 32 mEq/L   Glucose, Bld 145 (*) 70 - 99 mg/dL   BUN 13  6 - 23 mg/dL   Creatinine, Ser 0.80  0.50 - 1.10 mg/dL   Calcium 10.0  8.4 - 10.5 mg/dL   Total Protein 7.3  6.0 - 8.3 g/dL   Albumin 4.2  3.5 - 5.2 g/dL   AST 21  0 - 37 U/L   ALT 10  0 - 35 U/L   Alkaline Phosphatase 65  39 - 117 U/L   Total Bilirubin 0.4  0.3 - 1.2 mg/dL   GFR calc non Af Amer 77 (*) >90 mL/min   GFR calc Af Amer 89 (*) >90 mL/min   Comment: (NOTE)     The eGFR has been calculated using the CKD EPI equation.     This calculation  has not been validated in all clinical situations.     eGFR's persistently <90 mL/min signify possible Chronic Kidney     Disease.   Anion gap 12  5 - 15  SALICYLATE LEVEL     Status: Abnormal   Collection Time    07/10/14  6:00 PM      Result Value Ref Range   Salicylate Lvl <9.4 (*) 2.8 - 20.0 mg/dL  ETHANOL     Status: None   Collection Time    07/10/14  6:00 PM      Result Value Ref Range   Alcohol, Ethyl (B) <11  0 - 11 mg/dL   Comment:            LOWEST DETECTABLE LIMIT FOR     SERUM ALCOHOL IS 11 mg/dL     FOR MEDICAL PURPOSES ONLY  ACETAMINOPHEN LEVEL     Status: None   Collection Time    07/10/14  6:00 PM      Result Value Ref Range   Acetaminophen (Tylenol), Serum <15.0  10 - 30 ug/mL   Comment:  THERAPEUTIC CONCENTRATIONS VARY     SIGNIFICANTLY. A RANGE OF 10-30     ug/mL MAY BE AN EFFECTIVE     CONCENTRATION FOR MANY PATIENTS.     HOWEVER, SOME ARE BEST TREATED     AT CONCENTRATIONS OUTSIDE THIS     RANGE.     ACETAMINOPHEN CONCENTRATIONS     >150 ug/mL AT 4 HOURS AFTER     INGESTION AND >50 ug/mL AT 12     HOURS AFTER INGESTION ARE     OFTEN ASSOCIATED WITH TOXIC     REACTIONS.  HEMOGLOBIN A1C     Status: Abnormal   Collection Time    07/13/14  6:21 AM      Result Value Ref Range   Hemoglobin A1C 6.3 (*) <5.7 %   Comment: (NOTE)                                                                               According to the ADA Clinical Practice Recommendations for 2011, when     HbA1c is used as a screening test:      >=6.5%   Diagnostic of Diabetes Mellitus               (if abnormal result is confirmed)     5.7-6.4%   Increased risk of developing Diabetes Mellitus     References:Diagnosis and Classification of Diabetes Mellitus,Diabetes     KJZP,9150,56(PVXYI 1):S62-S69 and Standards of Medical Care in             Diabetes - 2011,Diabetes Care,2011,34 (Suppl 1):S11-S61.   Mean Plasma Glucose 134 (*) <117 mg/dL   Comment: Performed at  [facility], [facility]     Status: Abnormal   Collection Time    07/15/14  6:23 AM      Result Value Ref Range   Glucose, Bld 130 (*) 70 - 99 mg/dL   Comment: Performed at [facility], [facility]     Status: Abnormal   Collection Time    07/15/14 10:45 PM      Result Value Ref Range   Glucose-Capillary 118 (*) 70 - 99 mg/dL  LIPID PANEL     Status: None   Collection Time    07/20/14  6:30 AM      Result Value Ref Range   Cholesterol 162  0 - 200 mg/dL   Triglycerides 132  <150 mg/dL   HDL 65  >39 mg/dL   Total CHOL/HDL Ratio 2.5     VLDL 26  0 - 40 mg/dL   LDL Cholesterol 71  0 - 99 mg/dL   Comment:            Total Cholesterol/HDL:CHD Risk     Coronary Heart Disease Risk Table                         Men   Women      1/2 Average Risk   3.4   3.3      Average Risk       5.0   4.4      2 X Average Risk  9.6   7.1      3 X Average Risk  23.4   11.0                Use the calculated Patient Ratio     above and the CHD Risk Table     to determine the patient's CHD Risk.                ATP III CLASSIFICATION (LDL):      <100     mg/dL   Optimal      100-129  mg/dL   Near or Above                        Optimal      130-159  mg/dL   Borderline      160-189  mg/dL   High      >190     mg/dL   Very High     Performed at [facility]      Constitutional:  BP 118/75  Pulse 73  Ht '5\' 4"'  (1.626 m)  Wt 172 lb (78.019 kg)  BMI 29.51 kg/m2   Mental Status Examination;  Patient is casually dressed and fairly groomed.  Her speech is slow, clear and coherent.  Her thought process slow and logical.  She described her mood depressed and her affect is constricted.  She endorsed rumination and preoccupied to her physical symptoms.  There were no tremors, shakes.  She denies any active or passive suicidal thoughts or homicidal thoughts.  She denies any auditory or visual hallucination.  Her fund of knowledge is average.  She has  difficulty organizing her thoughts.  She has difficulty remembering things.  There were no delusions, paranoia or any obsessive thoughts.  Her psychomotor activity is slow.  There were no flight of ideas or any loose association.  She is alert and oriented x3.  Her insight judgment and impulse control is okay.   Review of Psycho-Social Stressors (1), Decision to obtain old records (1), Review and summation of old records (2), Established Problem, Worsening (2), Review of Last Therapy Session (1), Review of Medication Regimen & Side Effects (2) and Review of New Medication or Change in Dosage (2)  Assessment: Axis I: Maj. depressive disorder, recurrent, posttraumatic stress disorder, cognitive disorder NOS  Axis II: Deferred  Axis III:  Past Medical History  Diagnosis Date  . High cholesterol   . Depression   . Bipolar 1 disorder   . Arthritis   . Anxiety   . GERD (gastroesophageal reflux disease)     Axis IV: Moderate   Plan:  Reassurance given.  I also reviewed records from his neurologist.  Patient is going to have EMG studies for numbness.  I will defer any new medication at this time.  Recommended increase Zoloft 150 mg and reduced Remeron 30 mg to help anxiety and depression and insomnia.  Patient had a good response with Abilify.  Continue Abilify 10 mg daily.  Patient do need counseling and therapy and I encouraged her to keep appointment with a therapist in this office.  We will defer any increase in benzodiazepine.  She'll continue Klonopin 0.5 mg in the morning and 1.5 mg at bedtime.  Encouraged appetite and bought her calorie intake.  She is losing weight from the past.  I will see her again in 4 weeks however if she does not improve we will consider adding Wellbutrin.  Recommended to  call us back if she has any question or any concern. I will see her again in 4 weeks. Time spent 25 minutes.  More than 50% of the time spent in psychoeducation, counseling and coordination of care.   Discuss safety plan that anytime having active suicidal thoughts or homicidal thoughts then patient need to call 911 or go to the local emergency room.    Jadelyn Elks T., MD 09/07/2014

## 2014-09-08 DIAGNOSIS — K219 Gastro-esophageal reflux disease without esophagitis: Secondary | ICD-10-CM | POA: Diagnosis not present

## 2014-09-08 DIAGNOSIS — Z23 Encounter for immunization: Secondary | ICD-10-CM | POA: Diagnosis not present

## 2014-09-08 DIAGNOSIS — E785 Hyperlipidemia, unspecified: Secondary | ICD-10-CM | POA: Diagnosis not present

## 2014-09-08 DIAGNOSIS — I10 Essential (primary) hypertension: Secondary | ICD-10-CM | POA: Diagnosis not present

## 2014-09-08 DIAGNOSIS — E538 Deficiency of other specified B group vitamins: Secondary | ICD-10-CM | POA: Diagnosis not present

## 2014-09-08 NOTE — Telephone Encounter (Signed)
Please schedule a sleep consult ASAP, Thank You. CD

## 2014-09-13 ENCOUNTER — Institutional Professional Consult (permissible substitution): Payer: Medicare Other | Admitting: Neurology

## 2014-09-13 ENCOUNTER — Telehealth (HOSPITAL_COMMUNITY): Payer: Self-pay

## 2014-09-15 ENCOUNTER — Telehealth (HOSPITAL_COMMUNITY): Payer: Self-pay

## 2014-09-15 ENCOUNTER — Encounter: Payer: Medicare Other | Admitting: Diagnostic Neuroimaging

## 2014-09-15 NOTE — Telephone Encounter (Signed)
I returned patient's phone call and also I spoke to her sister who has power of attorney.  Patient is complaining of diarrhea and weight loss.  She has decreased appetite.  She also complaining of poor sleep.  I suggested to contact primary care physician to rule out any medical cause and if she is medically cleared we can try Seroquel and stop the Abilify.  Patient will contact her primary care physician.

## 2014-09-16 ENCOUNTER — Telehealth (HOSPITAL_COMMUNITY): Payer: Self-pay

## 2014-09-16 ENCOUNTER — Institutional Professional Consult (permissible substitution): Payer: Self-pay | Admitting: Neurology

## 2014-09-16 DIAGNOSIS — R197 Diarrhea, unspecified: Secondary | ICD-10-CM | POA: Diagnosis not present

## 2014-09-16 DIAGNOSIS — I959 Hypotension, unspecified: Secondary | ICD-10-CM | POA: Diagnosis not present

## 2014-09-20 NOTE — Telephone Encounter (Signed)
D:  Pt phoned and left vm requesting a medication change.  "I don't think the Abilify is working.  I would like to try the Seroquel again."  Pt c/o feeling hyper.  Denies SI/HI or A/V hallucinations.  A:  Writer offered MH-IOP, but pt declined.  Discussed possibility of referral to another provider (possibly a female psychiatrist.)  Pt receptive to idea.  Will explore female psychiatrists who accept Medicare insurance.  Placed call to [facility] Adolph Pollack, NP, Eino Farber, PA) accept [facility] pt's.  Dr. Albertine Patricia also, but referral is needed.  Pt would need to pay $150.00 deposit before being seen though.  Also, called [facility] [phone]) left vm inquiring if Lajuana Ripple, NP, etc accepts [facility] patients.  Will inform Dr. Adele Schilder.  R:  Pt receptive.

## 2014-09-23 ENCOUNTER — Telehealth (HOSPITAL_COMMUNITY): Payer: Self-pay | Admitting: *Deleted

## 2014-09-23 ENCOUNTER — Other Ambulatory Visit (HOSPITAL_COMMUNITY): Payer: Self-pay | Admitting: Psychiatry

## 2014-09-23 MED ORDER — QUETIAPINE FUMARATE 100 MG PO TABS: ORAL_TABLET | ORAL | Status: DC

## 2014-09-23 NOTE — Telephone Encounter (Signed)
I returned patient's phone call.  She wants to try Seroquel because her primary care physician that there is nothing wrong and she does not feel Abilify working very well for her sleep.  She wants to try Seroquel.  She had taken in the past up to 400 mg at bedtime.  She does not want to see any other provider I like to keep appointment with this Probation officer.  I recommended to discontinue the Abilify and try Seroquel 100 mg half to one tablet.  Discussed medication side effects.  Recommended to call us back if the symptoms get worse.

## 2014-09-23 NOTE — Telephone Encounter (Signed)
Pt called asking that Seroquel be called in to her pharmacy instead of Abilify. Does not want to be on Abilify.

## 2014-09-29 ENCOUNTER — Other Ambulatory Visit (HOSPITAL_COMMUNITY): Payer: Self-pay | Admitting: Psychiatry

## 2014-09-29 DIAGNOSIS — F331 Major depressive disorder, recurrent, moderate: Secondary | ICD-10-CM

## 2014-09-29 NOTE — Telephone Encounter (Signed)
Per Dr. Adele Schilder to fill pt medication. Per Dr. Adele Schilder to only do 45 tablets.

## 2014-09-30 ENCOUNTER — Ambulatory Visit (HOSPITAL_COMMUNITY): Payer: Self-pay | Admitting: Psychology

## 2014-10-01 ENCOUNTER — Telehealth (HOSPITAL_COMMUNITY): Payer: Self-pay | Admitting: *Deleted

## 2014-10-01 NOTE — Telephone Encounter (Signed)
Chart reviewed, refill not appropriate. Medication was discontinued August 2015. Pharmacy notified.

## 2014-10-04 ENCOUNTER — Other Ambulatory Visit (HOSPITAL_COMMUNITY): Payer: Self-pay | Admitting: Psychiatry

## 2014-10-05 ENCOUNTER — Ambulatory Visit (INDEPENDENT_AMBULATORY_CARE_PROVIDER_SITE_OTHER): Payer: Medicare Other | Admitting: Psychiatry

## 2014-10-05 ENCOUNTER — Encounter (HOSPITAL_COMMUNITY): Payer: Self-pay | Admitting: Psychology

## 2014-10-05 ENCOUNTER — Encounter (HOSPITAL_COMMUNITY): Payer: Self-pay | Admitting: Psychiatry

## 2014-10-05 VITALS — BP 117/79 | HR 76 | Ht 64.0 in | Wt 172.0 lb

## 2014-10-05 DIAGNOSIS — F4310 Post-traumatic stress disorder, unspecified: Secondary | ICD-10-CM

## 2014-10-05 DIAGNOSIS — F331 Major depressive disorder, recurrent, moderate: Secondary | ICD-10-CM

## 2014-10-05 MED ORDER — QUETIAPINE FUMARATE 100 MG PO TABS: ORAL_TABLET | ORAL | Status: DC

## 2014-10-05 MED ORDER — CLONAZEPAM 0.5 MG PO TABS: ORAL_TABLET | ORAL | Status: DC

## 2014-10-05 MED ORDER — SERTRALINE HCL 100 MG PO TABS: ORAL_TABLET | ORAL | Status: DC

## 2014-10-05 MED ORDER — MIRTAZAPINE 30 MG PO TABS: 30.0000 mg | ORAL_TABLET | Freq: Every day | ORAL | Status: DC

## 2014-10-05 NOTE — Progress Notes (Signed)
[name]   Kendra Simmons [id number] 63 y.o.  10/05/2014 3:11 PM  Chief Complaint:  I like Seroquel.  I'm sleeping 4-5 hours.    History of Present Illness:  Kendra Simmons came for her followup appointment.  She has called a few times requesting to change the medication on the phone.  She was taking Abilify but felt that it was not helping her.  She was also complaining of GI issues, memory impairment, lack of energy and feeling tired.  Her Abilify was discontinued on the phone and she was recommended to take Seroquel which she had taken in the past.  She also saw her physician and her diarrhea is improved.  She is sleeping at least 4-5 hours.  She still have anxiety and chronic depression but symptoms are improving slowly.  She has no tremors or shakes.  She has sometimes nightmares, flashback however she denies any active or passive suicidal thoughts or homicidal thoughts.  Her attention and concentration is also improved from the past.  She still sometimes does not remember very well but overall affect is improved from the past.  She was unable to go for EMG and sleep studies.  Patient is waiting for her to schedule appointment for sleep studies.  She admitted taking Klonopin 0.5 mg in the morning and sometimes 3 tablets at bedtime.  She denies any paranoia or any hallucination.  Patient lives by herself.  She has 3 children but patient has limited contact from her children.  Patient denies drinking or using any illegal substances.  Patient reported her appetite is better and her vitals are stable today.  Suicidal Ideation: No Plan Formed: No Patient has means to carry out plan: No  Homicidal Ideation: No Plan Formed: No Patient has means to carry out plan: No   Past Psychiatric History/Hospitalization(s) Patient has at least 3 psychiatric hospitalizations.  She was admitted at [facility] and recently to [facility].  She denies any history of  suicidal attempt.  Patient has history of hallucinations, paranoia and severe depression.  In the past she has seen [name], [name] and [name].  She has done at least once intensive outpatient program.  As per chart she has taken in the past doxepin, Xanax, Seroquel, amitriptyline, imipramine, Wellbutrin, Prozac however she do not remember the details. Anxiety: Yes Bipolar Disorder: No Depression: Yes Mania: No Psychosis: History of hallucinations Schizophrenia: No Personality Disorder: No Hospitalization for psychiatric illness: Yes History of Electroconvulsive Shock Therapy: No Prior Suicide Attempts: No  Medical History; Patient has high cholesterol, memory impairment, GERD and palpitation.  She has history of frequent falls and seen by a neurologist in the past.  Her primary care physician is Dr. Rex Simmons in [place].  Review of Systems  Constitutional: Positive for malaise/fatigue.  Skin: Negative.  Negative for itching and rash.  Neurological:       Neuropathy in her feet  Psychiatric/Behavioral: The patient is nervous/anxious and has insomnia.        Memory impairment   Psychiatric: Agitation: No Hallucination: No Depressed Mood: Yes Insomnia: No Hypersomnia: No Altered Concentration: No Feels Worthless: No Grandiose Ideas: No Belief In Special Powers: No New/Increased Substance Abuse: No Compulsions: No  Neurologic: Headache: No Seizure: No Paresthesias: Yes   Musculoskeletal: Strength & Muscle Tone: within normal limits Gait & Station: normal Patient leans: N/A   Outpatient Encounter Prescriptions as of 10/05/2014  Medication Sig  . clonazePAM (KLONOPIN) 0.5 MG  tablet Take 1 tablet (0.5 mg) tablet in am & 2 tablets at bed time  . liver oil-zinc oxide (DESITIN) 40 % ointment Apply topically daily. For wound care  . metoprolol tartrate (LOPRESSOR) 25 MG tablet Take 25 mg by mouth 2 (two) times daily.  . mirtazapine (REMERON) 30 MG tablet  Take 1 tablet (30 mg total) by mouth at bedtime. For depression/sleep  . omeprazole (PRILOSEC) 20 MG capsule Take 1 capsule (20 mg total) by mouth daily. For acid reflux  . pravastatin (PRAVACHOL) 20 MG tablet Take 1 tablet (20 mg total) by mouth at bedtime. For high cholesterol  . QUEtiapine (SEROQUEL) 100 MG tablet Take 1-2 tab at bed time  . sertraline (ZOLOFT) 100 MG tablet TAKE 1 TABLET (100 MG TOTAL) BY MOUTH DAILY. FOR DEPRESSION  . tamsulosin (FLOMAX) 0.4 MG CAPS capsule Take 1 capsule (0.4 mg total) by mouth daily. For urinary retension  . Vitamin D, Ergocalciferol, (DRISDOL) 50000 UNITS CAPS capsule Take 1 capsule (50,000 Units total) by mouth every 7 (seven) days. On Wednesday: For bone health  . [DISCONTINUED] ARIPiprazole (ABILIFY) 10 MG tablet Take 1 tablet (10 mg total) by mouth daily. For mood control  . [DISCONTINUED] clonazePAM (KLONOPIN) 0.5 MG tablet Take 1 tablet (0.5 mg) tablet in am & 3 tablets (1.5 mg): For anxiety  . [DISCONTINUED] mirtazapine (REMERON) 30 MG tablet Take 1 tablet (30 mg total) by mouth at bedtime. For depression/sleep  . [DISCONTINUED] QUEtiapine (SEROQUEL) 100 MG tablet Take 1/2 to 1 tab at bed time. Discontinue Abilify.  . [DISCONTINUED] sertraline (ZOLOFT) 100 MG tablet TAKE 1 TABLET (100 MG TOTAL) BY MOUTH DAILY. FOR DEPRESSION    Recent Results (from the past 2160 hour(s))  URINALYSIS, ROUTINE W REFLEX MICROSCOPIC     Status: Abnormal   Collection Time    07/10/14  3:52 PM      Result Value Ref Range   Color, Urine YELLOW  YELLOW   APPearance CLOUDY (*) CLEAR   Specific Gravity, Urine 1.021  1.005 - 1.030   pH 6.0  5.0 - 8.0   Glucose, UA NEGATIVE  NEGATIVE mg/dL   Hgb urine dipstick NEGATIVE  NEGATIVE   Bilirubin Urine NEGATIVE  NEGATIVE   Ketones, ur NEGATIVE  NEGATIVE mg/dL   Protein, ur NEGATIVE  NEGATIVE mg/dL   Urobilinogen, UA 0.2  0.0 - 1.0 mg/dL   Nitrite NEGATIVE  NEGATIVE   Leukocytes, UA SMALL (*) NEGATIVE  URINE  MICROSCOPIC-ADD ON     Status: Abnormal   Collection Time    07/10/14  3:52 PM      Result Value Ref Range   Squamous Epithelial / LPF FEW (*) RARE   WBC, UA 11-20  <3 WBC/hpf   RBC / HPF 0-2  <3 RBC/hpf   Bacteria, UA RARE  RARE   Urine-Other MUCOUS PRESENT    URINE RAPID DRUG SCREEN (HOSP PERFORMED)     Status: Abnormal   Collection Time    07/10/14  3:53 PM      Result Value Ref Range   Opiates NONE DETECTED  NONE DETECTED   Cocaine NONE DETECTED  NONE DETECTED   Benzodiazepines POSITIVE (*) NONE DETECTED   Amphetamines NONE DETECTED  NONE DETECTED   Tetrahydrocannabinol NONE DETECTED  NONE DETECTED   Barbiturates NONE DETECTED  NONE DETECTED   Comment:            DRUG SCREEN FOR MEDICAL PURPOSES     ONLY.  IF CONFIRMATION IS NEEDED  FOR ANY PURPOSE, NOTIFY LAB     WITHIN 5 DAYS.                LOWEST DETECTABLE LIMITS     FOR URINE DRUG SCREEN     Drug Class       Cutoff (ng/mL)     Amphetamine      1000     Barbiturate      200     Benzodiazepine   176     Tricyclics       160     Opiates          300     Cocaine          300     THC              50  CBC     Status: None   Collection Time    07/10/14  6:00 PM      Result Value Ref Range   WBC 5.9  4.0 - 10.5 K/uL   RBC 4.25  3.87 - 5.11 MIL/uL   Hemoglobin 12.8  12.0 - 15.0 g/dL   HCT 38.3  36.0 - 46.0 %   MCV 90.1  78.0 - 100.0 fL   MCH 30.1  26.0 - 34.0 pg   MCHC 33.4  30.0 - 36.0 g/dL   RDW 13.2  11.5 - 15.5 %   Platelets 174  150 - 400 K/uL  COMPREHENSIVE METABOLIC PANEL     Status: Abnormal   Collection Time    07/10/14  6:00 PM      Result Value Ref Range   Sodium 140  137 - 147 mEq/L   Potassium 3.8  3.7 - 5.3 mEq/L   Chloride 102  96 - 112 mEq/L   CO2 26  19 - 32 mEq/L   Glucose, Bld 145 (*) 70 - 99 mg/dL   BUN 13  6 - 23 mg/dL   Creatinine, Ser 0.80  0.50 - 1.10 mg/dL   Calcium 10.0  8.4 - 10.5 mg/dL   Total Protein 7.3  6.0 - 8.3 g/dL   Albumin 4.2  3.5 - 5.2 g/dL   AST 21  0 - 37 U/L    ALT 10  0 - 35 U/L   Alkaline Phosphatase 65  39 - 117 U/L   Total Bilirubin 0.4  0.3 - 1.2 mg/dL   GFR calc non Af Amer 77 (*) >90 mL/min   GFR calc Af Amer 89 (*) >90 mL/min   Comment: (NOTE)     The eGFR has been calculated using the CKD EPI equation.     This calculation has not been validated in all clinical situations.     eGFR's persistently <90 mL/min signify possible Chronic Kidney     Disease.   Anion gap 12  5 - 15  SALICYLATE LEVEL     Status: Abnormal   Collection Time    07/10/14  6:00 PM      Result Value Ref Range   Salicylate Lvl <7.3 (*) 2.8 - 20.0 mg/dL  ETHANOL     Status: None   Collection Time    07/10/14  6:00 PM      Result Value Ref Range   Alcohol, Ethyl (B) <11  0 - 11 mg/dL   Comment:            LOWEST DETECTABLE LIMIT FOR     SERUM ALCOHOL IS 11 mg/dL  FOR MEDICAL PURPOSES ONLY  ACETAMINOPHEN LEVEL     Status: None   Collection Time    07/10/14  6:00 PM      Result Value Ref Range   Acetaminophen (Tylenol), Serum <15.0  10 - 30 ug/mL   Comment:            THERAPEUTIC CONCENTRATIONS VARY     SIGNIFICANTLY. A RANGE OF 10-30     ug/mL MAY BE AN EFFECTIVE     CONCENTRATION FOR MANY PATIENTS.     HOWEVER, SOME ARE BEST TREATED     AT CONCENTRATIONS OUTSIDE THIS     RANGE.     ACETAMINOPHEN CONCENTRATIONS     >150 ug/mL AT 4 HOURS AFTER     INGESTION AND >50 ug/mL AT 12     HOURS AFTER INGESTION ARE     OFTEN ASSOCIATED WITH TOXIC     REACTIONS.  HEMOGLOBIN A1C     Status: Abnormal   Collection Time    07/13/14  6:21 AM      Result Value Ref Range   Hemoglobin A1C 6.3 (*) <5.7 %   Comment: (NOTE)                                                                               According to the ADA Clinical Practice Recommendations for 2011, when     HbA1c is used as a screening test:      >=6.5%   Diagnostic of Diabetes Mellitus               (if abnormal result is confirmed)     5.7-6.4%   Increased risk of developing Diabetes Mellitus      References:Diagnosis and Classification of Diabetes Mellitus,Diabetes     JKKX,3818,29(HBZJI 1):S62-S69 and Standards of Medical Care in             Diabetes - 2011,Diabetes Care,2011,34 (Suppl 1):S11-S61.   Mean Plasma Glucose 134 (*) <117 mg/dL   Comment: Performed at [facility], [facility]     Status: Abnormal   Collection Time    07/15/14  6:23 AM      Result Value Ref Range   Glucose, Bld 130 (*) 70 - 99 mg/dL   Comment: Performed at [facility], [facility]     Status: Abnormal   Collection Time    07/15/14 10:45 PM      Result Value Ref Range   Glucose-Capillary 118 (*) 70 - 99 mg/dL  LIPID PANEL     Status: None   Collection Time    07/20/14  6:30 AM      Result Value Ref Range   Cholesterol 162  0 - 200 mg/dL   Triglycerides 132  <150 mg/dL   HDL 65  >39 mg/dL   Total CHOL/HDL Ratio 2.5     VLDL 26  0 - 40 mg/dL   LDL Cholesterol 71  0 - 99 mg/dL   Comment:            Total Cholesterol/HDL:CHD Risk     Coronary Heart Disease Risk Table  Men   Women      1/2 Average Risk   3.4   3.3      Average Risk       5.0   4.4      2 X Average Risk   9.6   7.1      3 X Average Risk  23.4   11.0                Use the calculated Patient Ratio     above and the CHD Risk Table     to determine the patient's CHD Risk.                ATP III CLASSIFICATION (LDL):      <100     mg/dL   Optimal      100-129  mg/dL   Near or Above                        Optimal      130-159  mg/dL   Borderline      160-189  mg/dL   High      >190     mg/dL   Very High     Performed at [facility]      Constitutional:  BP 117/79  Pulse 76  Ht _0  (1.626 m)  Wt 172 lb (78.019 kg)  BMI 29.51 kg/m2   Mental Status Examination;  Patient is casually dressed and fairly groomed.  Her speech is slow, clear and coherent.  Her thought process slow and logical.  She described her mood anxious and her affect is improved  from the past.  There were no tremors, shakes.  She denies any active or passive suicidal thoughts or homicidal thoughts.  She denies any auditory or visual hallucination.  Her fund of knowledge is average.  She has difficulty organizing her thoughts.  She has difficulty remembering things.  There were no delusions, paranoia or any obsessive thoughts.  Her psychomotor activity is slow.  There were no flight of ideas or any loose association.  She is alert and oriented x3.  Her insight judgment and impulse control is okay.   Established Problem, Stable/Improving (1), Review of Psycho-Social Stressors (1), Review and summation of old records (2), Review of Last Therapy Session (1), Review of Medication Regimen & Side Effects (2) and Review of New Medication or Change in Dosage (2)  Assessment: Axis I: Maj. depressive disorder, recurrent, posttraumatic stress disorder, cognitive disorder NOS  Axis II: Deferred  Axis III:  Past Medical History  Diagnosis Date  . High cholesterol   . Depression   . Bipolar 1 disorder   . Arthritis   . Anxiety   . GERD (gastroesophageal reflux disease)     Axis IV: Moderate   Plan:  Reassurance given.  Recommended to try Seroquel 150 -200 mg at bedtime to help her sleep and depressive symptoms.  I will discontinue Abilify since patient is taking Seroquel.  Recommended to continue Klonopin 0.5 mg in the morning and 1 mg at bedtime.  She will require a new prescription .  At this time I will continue Zoloft 150 mg and Remeron 30 mg daily.  Discuss medication side effects, benefits in detail.  I also encouraged to keep appointment for EMG and sleep studies.  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  Patient is not interested in counseling at this time.   I will  see her again in 4 weeks.   Time spent 25 minutes.  More than 50% of the time spent in psychoeducation, counseling and coordination of care.  Discuss safety plan that anytime having active suicidal  thoughts or homicidal thoughts then patient need to call 911 or go to the local emergency room.    Ritchard Paragas T., MD 10/05/2014

## 2014-10-13 ENCOUNTER — Emergency Department (HOSPITAL_COMMUNITY)
Admission: EM | Admit: 2014-10-13 | Discharge: 2014-10-13 | Disposition: A | Discharge status: Home / Self Care | Payer: Medicare Other | Attending: Emergency Medicine | Admitting: Emergency Medicine

## 2014-10-13 ENCOUNTER — Encounter (HOSPITAL_COMMUNITY): Payer: Self-pay | Admitting: Emergency Medicine

## 2014-10-13 ENCOUNTER — Emergency Department (HOSPITAL_COMMUNITY): Payer: Medicare Other

## 2014-10-13 DIAGNOSIS — Z79899 Other long term (current) drug therapy: Secondary | ICD-10-CM | POA: Insufficient documentation

## 2014-10-13 DIAGNOSIS — E780 Pure hypercholesterolemia: Secondary | ICD-10-CM | POA: Insufficient documentation

## 2014-10-13 DIAGNOSIS — F319 Bipolar disorder, unspecified: Secondary | ICD-10-CM | POA: Insufficient documentation

## 2014-10-13 DIAGNOSIS — F419 Anxiety disorder, unspecified: Secondary | ICD-10-CM | POA: Insufficient documentation

## 2014-10-13 DIAGNOSIS — K219 Gastro-esophageal reflux disease without esophagitis: Secondary | ICD-10-CM | POA: Insufficient documentation

## 2014-10-13 DIAGNOSIS — R531 Weakness: Secondary | ICD-10-CM | POA: Diagnosis not present

## 2014-10-13 DIAGNOSIS — M1990 Unspecified osteoarthritis, unspecified site: Secondary | ICD-10-CM | POA: Insufficient documentation

## 2014-10-13 DIAGNOSIS — R079 Chest pain, unspecified: Secondary | ICD-10-CM | POA: Diagnosis not present

## 2014-10-13 DIAGNOSIS — R0602 Shortness of breath: Secondary | ICD-10-CM | POA: Diagnosis not present

## 2014-10-13 DIAGNOSIS — R0789 Other chest pain: Secondary | ICD-10-CM | POA: Diagnosis not present

## 2014-10-13 DIAGNOSIS — R072 Precordial pain: Secondary | ICD-10-CM | POA: Diagnosis not present

## 2014-10-13 LAB — URINALYSIS, ROUTINE W REFLEX MICROSCOPIC
Bilirubin Urine: NEGATIVE
GLUCOSE, UA: NEGATIVE mg/dL
HGB URINE DIPSTICK: NEGATIVE
Ketones, ur: NEGATIVE mg/dL
Leukocytes, UA: NEGATIVE
Nitrite: NEGATIVE
Protein, ur: NEGATIVE mg/dL
Specific Gravity, Urine: 1.007 (ref 1.005–1.030)
Urobilinogen, UA: 0.2 mg/dL (ref 0.0–1.0)
pH: 7.5 (ref 5.0–8.0)

## 2014-10-13 LAB — BASIC METABOLIC PANEL
Anion gap: 17 — ABNORMAL HIGH (ref 5–15)
BUN: 4 mg/dL — AB (ref 6–23)
CHLORIDE: 104 meq/L (ref 96–112)
CO2: 24 mEq/L (ref 19–32)
CREATININE: 0.61 mg/dL (ref 0.50–1.10)
Calcium: 9.5 mg/dL (ref 8.4–10.5)
GFR calc Af Amer: >90 mL/min (ref >90)
Glucose, Bld: 137 mg/dL — ABNORMAL HIGH (ref 70–99)
Potassium: 3.5 mEq/L — ABNORMAL LOW (ref 3.7–5.3)
Sodium: 145 mEq/L (ref 137–147)

## 2014-10-13 LAB — CBC
HEMATOCRIT: 43.5 % (ref 36.0–46.0)
Hemoglobin: 14.3 g/dL (ref 12.0–15.0)
MCH: 30 pg (ref 26.0–34.0)
MCHC: 32.9 g/dL (ref 30.0–36.0)
MCV: 91.2 fL (ref 78.0–100.0)
Platelets: 136 10*3/uL — ABNORMAL LOW (ref 150–400)
RBC: 4.77 MIL/uL (ref 3.87–5.11)
RDW: 13.8 % (ref 11.5–15.5)
WBC: 6.2 10*3/uL (ref 4.0–10.5)

## 2014-10-13 LAB — TROPONIN I: Troponin I: <0.3 ng/mL (ref <0.30)

## 2014-10-13 LAB — I-STAT TROPONIN, ED: Troponin i, poc: 0 ng/mL (ref 0.00–0.08)

## 2014-10-13 MED ORDER — POTASSIUM CHLORIDE CRYS ER 20 MEQ PO TBCR
40.0000 meq | EXTENDED_RELEASE_TABLET | Freq: Once | ORAL | Status: AC
  Administered 2014-10-13: 40 meq via ORAL
  Filled 2014-10-13: qty 2

## 2014-10-13 MED ORDER — LORAZEPAM 2 MG/ML IJ SOLN
INTRAMUSCULAR | Status: AC
  Administered 2014-10-13: 1 mg via INTRAVENOUS
  Filled 2014-10-13: qty 1

## 2014-10-13 MED ORDER — FAMOTIDINE IN NACL 20-0.9 MG/50ML-% IV SOLN
20.0000 mg | Freq: Once | INTRAVENOUS | Status: AC
  Administered 2014-10-13: 20 mg via INTRAVENOUS
  Filled 2014-10-13: qty 50

## 2014-10-13 MED ORDER — NITROGLYCERIN 0.4 MG SL SUBL
0.4000 mg | SUBLINGUAL_TABLET | SUBLINGUAL | Status: DC | PRN
  Administered 2014-10-13 (×2): 0.4 mg via SUBLINGUAL
  Filled 2014-10-13: qty 1

## 2014-10-13 MED ORDER — PANTOPRAZOLE SODIUM 20 MG PO TBEC: 20.0000 mg | DELAYED_RELEASE_TABLET | Freq: Every day | ORAL | Status: DC

## 2014-10-13 MED ORDER — MORPHINE SULFATE 4 MG/ML IJ SOLN
4.0000 mg | Freq: Once | INTRAMUSCULAR | Status: AC
  Administered 2014-10-13: 4 mg via INTRAVENOUS
  Filled 2014-10-13: qty 1

## 2014-10-13 MED ORDER — LORAZEPAM 2 MG/ML IJ SOLN
1.0000 mg | INTRAMUSCULAR | Status: DC | PRN
  Administered 2014-10-13: 1 mg via INTRAVENOUS

## 2014-10-13 MED ORDER — PANTOPRAZOLE SODIUM 40 MG PO TBEC
40.0000 mg | DELAYED_RELEASE_TABLET | Freq: Once | ORAL | Status: AC
  Administered 2014-10-13: 40 mg via ORAL
  Filled 2014-10-13: qty 1

## 2014-10-13 MED ORDER — GI COCKTAIL ~~LOC~~
30.0000 mL | Freq: Once | ORAL | Status: AC
  Administered 2014-10-13: 30 mL via ORAL
  Filled 2014-10-13: qty 30

## 2014-10-13 MED ORDER — OXYCODONE-ACETAMINOPHEN 5-325 MG PO TABS: 1.0000 | ORAL_TABLET | ORAL | Status: DC | PRN

## 2014-10-13 NOTE — ED Notes (Signed)
Per EMS: from [facility] with lower substernal cp non radiating described as "just there".  Hx of insomnia with 2 cups of coffee last night last one at 0500, thinks it might be related to that as coffee is not something she normally drinks.  No sleep last night, reports sob with cp.  Has had 324 of asa, 1 0.4 sl ntg with no relief, describes pain 5/10.  20 gauge right AC, vs 128/93, 72 bpm, 18 rr, 97% on 2 liters O2 for subjective shortness of breath.  Patient ambulatory to BR with no apparent difficulty and no acute distress noted.  Alert and oriented.

## 2014-10-13 NOTE — ED Provider Notes (Signed)
Pt seen and evaluated.  C/o "uncomfortableness" in her Ss Chest. Also c/o severe anxiety since breakfast and coffee this morning. Prior anxiety, no prior chest pain, or cardiac history.  EKG without ST changes. Exam:  Clear lungs, sinus rhythm, very anxious. Not diaphoretic.  Tanna Furry, MD 10/13/14 1053

## 2014-10-13 NOTE — ED Provider Notes (Signed)
CSN: [id number]     Arrival date & time 10/13/14  5284 History   First MD Initiated Contact with Patient 10/13/14 [phone]     Chief Complaint  Patient presents with  . Chest Pain  . Anxiety     (Consider location/radiation/quality/duration/timing/severity/associated sxs/prior Treatment) HPI Comments: Patient is a 63 yo F PMHx significant for HLD, Anxiety, GERD presenting to the ED for acute onset substernal chest pressure without radiation with associated shortness of breath that began 90 minutes prior to arrival. Alleviating factors: none. Aggravating factors: none. Medications tried prior to arrival: 324mg  ASA, 1 SL nitroglycerin. Patient has had an echocardiogram, states "it was a very long time ago." No history of stress test or cardiac catheterizations.     Patient is a 63 y.o. female presenting with chest pain and anxiety.  Chest Pain Associated symptoms: anxiety and shortness of breath   Anxiety Associated symptoms include chest pain.    Past Medical History  Diagnosis Date  . High cholesterol   . Depression   . Bipolar 1 disorder   . Arthritis   . Anxiety   . GERD (gastroesophageal reflux disease)    Past Surgical History  Procedure Laterality Date  . Cosmetic surgery    . Cesarean section    . Laproscopy     Family History  Problem Relation Age of Onset  . Sleep apnea Father   . Alcohol abuse Father   . Diabetes Mother   . Diabetes Sister   . Diabetes Maternal Uncle   . Diabetes Cousin    History  Substance Use Topics  . Smoking status: Never Smoker   . Smokeless tobacco: Never Used  . Alcohol Use: No   OB History   Grav Para Term Preterm Abortions TAB SAB Ect Mult Living                 Review of Systems  Respiratory: Positive for shortness of breath.   Cardiovascular: Positive for chest pain.  All other systems reviewed and are negative.     Allergies  Review of patient's allergies indicates no known allergies.  Home Medications   Prior  to Admission medications   Medication Sig Start Date End Date Taking? Authorizing Provider  clonazePAM (KLONOPIN) 0.5 MG tablet Take 0.5-1 mg by mouth 2 (two) times daily. Take 1 tablet in the morning and 2 tablets at bedtime   Yes Historical Provider, MD  Cyanocobalamin (VITAMIN B-12 IJ) Inject as directed once a week. On Saturday   Yes Historical Provider, MD  mirtazapine (REMERON) 30 MG tablet Take 1 tablet (30 mg total) by mouth at bedtime. For depression/sleep 10/05/14  Yes [name], MD  omeprazole (PRILOSEC) 20 MG capsule Take 1 capsule (20 mg total) by mouth daily. For acid reflux 07/26/14  Yes Encarnacion Slates, NP  POTASSIUM PO Take 1 tablet by mouth daily.   Yes Historical Provider, MD  pravastatin (PRAVACHOL) 20 MG tablet Take 1 tablet (20 mg total) by mouth at bedtime. For high cholesterol 07/26/14  Yes Encarnacion Slates, NP  QUEtiapine (SEROQUEL) 100 MG tablet Take 100-200 mg by mouth at bedtime.   Yes Historical Provider, MD  sertraline (ZOLOFT) 100 MG tablet Take 100 mg by mouth daily.   Yes Historical Provider, MD  tamsulosin (FLOMAX) 0.4 MG CAPS capsule Take 1 capsule (0.4 mg total) by mouth daily. For urinary retension 07/26/14  Yes Encarnacion Slates, NP  Vitamin D, Ergocalciferol, (DRISDOL) 50000 UNITS CAPS capsule Take 1 capsule (  50,000 Units total) by mouth every 7 (seven) days. On Wednesday: For bone health 07/26/14  Yes Encarnacion Slates, NP  oxyCODONE-acetaminophen (PERCOCET/ROXICET) 5-325 MG per tablet Take 1 tablet by mouth every 4 (four) hours as needed for severe pain. May take 2 tablets PO q 6 hours for severe pain - Do not take with Tylenol as this tablet already contains tylenol 10/13/14   Shacoria Latif L Logon [place], PA-C  pantoprazole (PROTONIX) 20 MG tablet Take 1 tablet (20 mg total) by mouth daily. 10/13/14   Jaymari Cromie L Mylen Mangan, PA-C   BP 139/69  Pulse 79  Temp(Src) 98.1 F (36.7 C) (Oral)  Resp 13  Ht 5' 3.5" (1.613 m)  Wt 170 lb (77.111 kg)  BMI 29.64 kg/m2  SpO2  96% Physical Exam  Constitutional: She is oriented to person, place, and time. She appears well-developed and well-nourished. No distress.  HENT:  Head: Normocephalic and atraumatic.  Right Ear: External ear normal.  Left Ear: External ear normal.  Nose: Nose normal.  Mouth/Throat: Oropharynx is clear and moist. No oropharyngeal exudate.  Eyes: Conjunctivae are normal.  Neck: Neck supple.  Cardiovascular: Normal rate, regular rhythm and normal heart sounds.   Pulmonary/Chest: Effort normal and breath sounds normal.  Abdominal: Soft. There is no tenderness.  Musculoskeletal: She exhibits no edema.  Neurological: She is alert and oriented to person, place, and time.  Skin: Skin is warm and dry. She is not diaphoretic.  Psychiatric: Her mood appears anxious.  Flat affect     ED Course  Procedures (including critical care time) Medications  nitroGLYCERIN (NITROSTAT) SL tablet 0.4 mg (0.4 mg Sublingual Given 10/13/14 1116)  LORazepam (ATIVAN) injection 1 mg (1 mg Intravenous Given 10/13/14 1011)  pantoprazole (PROTONIX) EC tablet 40 mg (not administered)  morphine 4 MG/ML injection 4 mg (4 mg Intravenous Given 10/13/14 1151)  potassium chloride SA (K-DUR,KLOR-CON) CR tablet 40 mEq (40 mEq Oral Given 10/13/14 1156)  gi cocktail (Maalox,Lidocaine,Donnatal) (30 mLs Oral Given 10/13/14 1339)  famotidine (PEPCID) IVPB 20 mg (0 mg Intravenous Stopped 10/13/14 1444)    Labs Review Labs Reviewed  CBC - Abnormal; Notable for the following:    Platelets 136 (*)    All other components within normal limits  BASIC METABOLIC PANEL - Abnormal; Notable for the following:    Potassium 3.5 (*)    Glucose, Bld 137 (*)    BUN 4 (*)    Anion gap 17 (*)    All other components within normal limits  URINE CULTURE  TROPONIN I  TROPONIN I  URINALYSIS, ROUTINE W REFLEX MICROSCOPIC  I-STAT TROPOININ, ED    Imaging Review Dg Chest 2 View  10/13/2014   CLINICAL DATA:  Chest pain and shortness of  breath.  Weakness.  EXAM: CHEST  2 VIEW  COMPARISON:  Chest x-ray dated 10/20/2012  FINDINGS: The heart size and mediastinal contours are within normal limits. Both lungs are clear. The visualized skeletal structures are unremarkable.  IMPRESSION: Normal exam.   Electronically Signed   By: Rozetta Nunnery M.D.   On: 10/13/2014 12:08     EKG Interpretation   Date/Time:  Wednesday October 13 2014 09:48:19 EDT Ventricular Rate:  67 PR Interval:  131 QRS Duration: 82 QT Interval:  440 QTC Calculation: 464 R Axis:   19 Text Interpretation:  Sinus rhythm Confirmed by Jeneen Rinks  MD, [place] ([id number]) on  10/13/2014 10:04:46 AM      Heart Score 3  MDM   Final diagnoses:  Chest pain  Anxiety    Filed Vitals:   10/13/14 1530  BP: 139/69  Pulse: 79  Temp:   Resp: 13   Afebrile, NAD, non-toxic appearing, AAOx4.  Patient is to be discharged with recommendation to follow up with PCP in regards to today's hospital visit. Chest pain is not likely of cardiac or pulmonary etiology d/t presentation, low clinical suspicion for PE, VSS, no tracheal deviation, no JVD or new murmur, RRR, breath sounds equal bilaterally, EKG without acute abnormalities, negative delta troponin, and negative CXR. Pt has been advised start a PPI and return to the ED is CP becomes exertional, associated with diaphoresis or nausea, radiates to left jaw/arm, worsens or becomes concerning in any way. Pt appears reliable for follow up and is agreeable to discharge.   Case has been discussed with and seen by Dr. Jeneen Rinks who agrees with the above plan to discharge.      Harlow Mares, PA-C 10/13/14 1644  Tanna Furry, MD 10/22/14 [phone]

## 2014-10-13 NOTE — ED Notes (Signed)
Asked Dr. Jeneen Rinks for anxiolytics for patient because she is acting very anxious.  See new orders.

## 2014-10-13 NOTE — ED Notes (Signed)
Dr. James at bedside  

## 2014-10-13 NOTE — Discharge Instructions (Signed)
Please follow up with your primary care physician in 1-2 days. If you do not have one please call the [place] number listed above. Please follow up with [facility] to schedule a follow up appointment.  Please take pain medication and/or muscle relaxants as prescribed and as needed for pain. Please do not drive on narcotic pain medication or on muscle relaxants. Please take Protonix as prescribed. Please read all discharge instructions and return precautions.    Chest Pain (Nonspecific) It is often hard to give a specific diagnosis for the cause of chest pain. There is always a chance that your pain could be related to something serious, such as a heart attack or a blood clot in the lungs. You need to follow up with your health care provider for further evaluation. CAUSES   Heartburn.  Pneumonia or bronchitis.  Anxiety or stress.  Inflammation around your heart (pericarditis) or lung (pleuritis or pleurisy).  A blood clot in the lung.  A collapsed lung (pneumothorax). It can develop suddenly on its own (spontaneous pneumothorax) or from trauma to the chest.  Shingles infection (herpes zoster virus). The chest wall is composed of bones, muscles, and cartilage. Any of these can be the source of the pain.  The bones can be bruised by injury.  The muscles or cartilage can be strained by coughing or overwork.  The cartilage can be affected by inflammation and become sore (costochondritis). DIAGNOSIS  Lab tests or other studies may be needed to find the cause of your pain. Your health care provider may have you take a test called an ambulatory electrocardiogram (ECG). An ECG records your heartbeat patterns over a 24-hour period. You may also have other tests, such as:  Transthoracic echocardiogram (TTE). During echocardiography, sound waves are used to evaluate how blood flows through your heart.  Transesophageal echocardiogram (TEE).  Cardiac  monitoring. This allows your health care provider to monitor your heart rate and rhythm in real time.  Holter monitor. This is a portable device that records your heartbeat and can help diagnose heart arrhythmias. It allows your health care provider to track your heart activity for several days, if needed.  Stress tests by exercise or by giving medicine that makes the heart beat faster. TREATMENT   Treatment depends on what may be causing your chest pain. Treatment may include:  Acid blockers for heartburn.  Anti-inflammatory medicine.  Pain medicine for inflammatory conditions.  Antibiotics if an infection is present.  You may be advised to change lifestyle habits. This includes stopping smoking and avoiding alcohol, caffeine, and chocolate.  You may be advised to keep your head raised (elevated) when sleeping. This reduces the chance of acid going backward from your stomach into your esophagus. Most of the time, nonspecific chest pain will improve within 2-3 days with rest and mild pain medicine.  HOME CARE INSTRUCTIONS   If antibiotics were prescribed, take them as directed. Finish them even if you start to feel better.  For the next few days, avoid physical activities that bring on chest pain. Continue physical activities as directed.  Do not use any tobacco products, including cigarettes, chewing tobacco, or electronic cigarettes.  Avoid drinking alcohol.  Only take medicine as directed by your health care provider.  Follow your health care provider's suggestions for further testing if your chest pain does not go away.  Keep any follow-up appointments you made. If you do not go to an appointment, you could develop lasting (chronic) problems  with pain. If there is any problem keeping an appointment, call to reschedule. SEEK MEDICAL CARE IF:   Your chest pain does not go away, even after treatment.  You have a rash with blisters on your chest.  You have a fever. SEEK  IMMEDIATE MEDICAL CARE IF:   You have increased chest pain or pain that spreads to your arm, neck, jaw, back, or abdomen.  You have shortness of breath.  You have an increasing cough, or you cough up blood.  You have severe back or abdominal pain.  You feel nauseous or vomit.  You have severe weakness.  You faint.  You have chills. This is an emergency. Do not wait to see if the pain will go away. Get medical help at once. Call your local emergency services (911 in [place] Do not drive yourself to the hospital. MAKE SURE YOU:   Understand these instructions.  Will watch your condition.  Will get help right away if you are not doing well or get worse. Document Released: 09/12/2005 Document Revised: 12/08/2013 Document Reviewed: 07/08/2008 [facility] Patient Information 2015 [place], [place]. This information is not intended to replace advice given to you by your health care provider. Make sure you discuss any questions you have with your health care provider.

## 2014-10-13 NOTE — ED Notes (Signed)
Jennifer,PA at bedside

## 2014-10-14 LAB — URINE CULTURE: Colony Count: 75000

## 2014-10-15 ENCOUNTER — Encounter (HOSPITAL_COMMUNITY): Payer: Self-pay | Admitting: Emergency Medicine

## 2014-10-15 ENCOUNTER — Emergency Department (HOSPITAL_COMMUNITY)
Admission: EM | Admit: 2014-10-15 | Discharge: 2014-10-16 | Disposition: A | Discharge status: Home / Self Care | Payer: Medicare Other | Attending: Emergency Medicine | Admitting: Emergency Medicine

## 2014-10-15 ENCOUNTER — Emergency Department (HOSPITAL_COMMUNITY)
Admission: EM | Admit: 2014-10-15 | Discharge: 2014-10-15 | Disposition: A | Discharge status: Home / Self Care | Payer: Medicare Other | Source: Home / Self Care | Attending: Emergency Medicine | Admitting: Emergency Medicine

## 2014-10-15 DIAGNOSIS — F1310 Sedative, hypnotic or anxiolytic abuse, uncomplicated: Secondary | ICD-10-CM | POA: Diagnosis not present

## 2014-10-15 DIAGNOSIS — Z79899 Other long term (current) drug therapy: Secondary | ICD-10-CM

## 2014-10-15 DIAGNOSIS — R000 Tachycardia, unspecified: Secondary | ICD-10-CM | POA: Diagnosis not present

## 2014-10-15 DIAGNOSIS — E780 Pure hypercholesterolemia: Secondary | ICD-10-CM | POA: Insufficient documentation

## 2014-10-15 DIAGNOSIS — F419 Anxiety disorder, unspecified: Secondary | ICD-10-CM | POA: Insufficient documentation

## 2014-10-15 DIAGNOSIS — K219 Gastro-esophageal reflux disease without esophagitis: Secondary | ICD-10-CM | POA: Insufficient documentation

## 2014-10-15 DIAGNOSIS — M1990 Unspecified osteoarthritis, unspecified site: Secondary | ICD-10-CM | POA: Insufficient documentation

## 2014-10-15 DIAGNOSIS — F331 Major depressive disorder, recurrent, moderate: Secondary | ICD-10-CM

## 2014-10-15 DIAGNOSIS — G4700 Insomnia, unspecified: Secondary | ICD-10-CM | POA: Insufficient documentation

## 2014-10-15 DIAGNOSIS — F319 Bipolar disorder, unspecified: Secondary | ICD-10-CM

## 2014-10-15 DIAGNOSIS — Z760 Encounter for issue of repeat prescription: Secondary | ICD-10-CM | POA: Diagnosis not present

## 2014-10-15 HISTORY — DX: Insomnia, unspecified: G47.00

## 2014-10-15 LAB — RAPID URINE DRUG SCREEN, HOSP PERFORMED
Amphetamines: NOT DETECTED
BENZODIAZEPINES: POSITIVE — AB
Barbiturates: POSITIVE — AB
COCAINE: NOT DETECTED
Opiates: NOT DETECTED
TETRAHYDROCANNABINOL: NOT DETECTED

## 2014-10-15 LAB — CBC
HCT: 41.4 % (ref 36.0–46.0)
Hemoglobin: 13.5 g/dL (ref 12.0–15.0)
MCH: 29.3 pg (ref 26.0–34.0)
MCHC: 32.6 g/dL (ref 30.0–36.0)
MCV: 89.8 fL (ref 78.0–100.0)
PLATELETS: 140 10*3/uL — AB (ref 150–400)
RBC: 4.61 MIL/uL (ref 3.87–5.11)
RDW: 13.9 % (ref 11.5–15.5)
WBC: 5.9 10*3/uL (ref 4.0–10.5)

## 2014-10-15 MED ORDER — CLONAZEPAM 0.5 MG PO TABS: 1.0000 mg | ORAL_TABLET | Freq: Every day | ORAL | Status: DC

## 2014-10-15 MED ORDER — QUETIAPINE FUMARATE 100 MG PO TABS: 200.0000 mg | ORAL_TABLET | Freq: Every day | ORAL | Status: DC

## 2014-10-15 MED ORDER — MIRTAZAPINE 30 MG PO TABS: 30.0000 mg | ORAL_TABLET | Freq: Every day | ORAL | Status: DC

## 2014-10-15 NOTE — ED Provider Notes (Signed)
CSN: [id number]     Arrival date & time 10/15/14  0019 History   First MD Initiated Contact with Patient 10/15/14 0133     Chief Complaint  Patient presents with  . Medication Refill     (Consider location/radiation/quality/duration/timing/severity/associated sxs/prior Treatment) HPI 63 year old female presents to the emergency department requesting her nightly sleep medications in prescription form so that she can take them to an all-night pharmacy.  She reports that her sister who is her healthcare power of attorney has her pills, and is asleep and is not answering the door.  Patient reports that she received her medications from her sister around 3 PM today.  She took them, and slept from 3 until 8:30.  She reports that her sleep-wake schedule has been irregular recently due to drinking coffee at the wrong time.  Patient wants to be able to sleep tonight to get back on schedule.  She denies SI or HI.  She will have access to her medications tomorrow when her sister is awake.   Past Medical History  Diagnosis Date  . High cholesterol   . Depression   . Bipolar 1 disorder   . Arthritis   . Anxiety   . GERD (gastroesophageal reflux disease)    Past Surgical History  Procedure Laterality Date  . Cosmetic surgery    . Cesarean section    . Laproscopy     Family History  Problem Relation Age of Onset  . Sleep apnea Father   . Alcohol abuse Father   . Diabetes Mother   . Diabetes Sister   . Diabetes Maternal Uncle   . Diabetes Cousin    History  Substance Use Topics  . Smoking status: Never Smoker   . Smokeless tobacco: Never Used  . Alcohol Use: No   OB History   Grav Para Term Preterm Abortions TAB SAB Ect Mult Living                 Review of Systems  See History of Present Illness; otherwise all other systems are reviewed and negative   Allergies  Review of patient's allergies indicates no known allergies.  Home Medications   Prior to Admission medications    Medication Sig Start Date End Date Taking? Authorizing Provider  Cyanocobalamin (VITAMIN B-12 IJ) Inject as directed once a week. On Saturday   Yes Historical Provider, MD  omeprazole (PRILOSEC) 20 MG capsule Take 1 capsule (20 mg total) by mouth daily. For acid reflux 07/26/14  Yes Encarnacion Slates, NP  oxyCODONE-acetaminophen (PERCOCET/ROXICET) 5-325 MG per tablet Take 1 tablet by mouth every 4 (four) hours as needed for severe pain. May take 2 tablets PO q 6 hours for severe pain - Do not take with Tylenol as this tablet already contains tylenol 10/13/14  Yes Jennifer L Piepenbrink, PA-C  pantoprazole (PROTONIX) 20 MG tablet Take 1 tablet (20 mg total) by mouth daily. 10/13/14  Yes Jennifer L Piepenbrink, PA-C  POTASSIUM PO Take 1 tablet by mouth daily.   Yes Historical Provider, MD  pravastatin (PRAVACHOL) 20 MG tablet Take 1 tablet (20 mg total) by mouth at bedtime. For high cholesterol 07/26/14  Yes Encarnacion Slates, NP  sertraline (ZOLOFT) 100 MG tablet Take 100 mg by mouth daily.   Yes Historical Provider, MD  tamsulosin (FLOMAX) 0.4 MG CAPS capsule Take 1 capsule (0.4 mg total) by mouth daily. For urinary retension 07/26/14  Yes Encarnacion Slates, NP  Vitamin D, Ergocalciferol, (DRISDOL) 50000 UNITS CAPS capsule  Take 1 capsule (50,000 Units total) by mouth every 7 (seven) days. On Wednesday: For bone health 07/26/14  Yes Encarnacion Slates, NP   BP 149/67  Pulse 71  Resp 16  SpO2 98% Physical Exam  Nursing note and vitals reviewed. Constitutional: She is oriented to person, place, and time. She appears well-developed and well-nourished. No distress.  HENT:  Head: Normocephalic and atraumatic.  Nose: Nose normal.  Mouth/Throat: Oropharynx is clear and moist.  Eyes: Conjunctivae and EOM are normal. Pupils are equal, round, and reactive to light.  Neck: Normal range of motion. Neck supple. No JVD present. No tracheal deviation present. No thyromegaly present.  Cardiovascular: Normal rate, regular  rhythm, normal heart sounds and intact distal pulses.  Exam reveals no gallop and no friction rub.   No murmur heard. Pulmonary/Chest: Effort normal and breath sounds normal. No stridor. No respiratory distress. She has no wheezes. She has no rales. She exhibits no tenderness.  Abdominal: Soft. Bowel sounds are normal. She exhibits no distension and no mass. There is no tenderness. There is no rebound and no guarding.  Musculoskeletal: Normal range of motion. She exhibits no edema and no tenderness.  Lymphadenopathy:    She has no cervical adenopathy.  Neurological: She is alert and oriented to person, place, and time. She displays normal reflexes. She exhibits normal muscle tone. Coordination normal.  Skin: Skin is warm and dry. No rash noted. No erythema. No pallor.  Psychiatric: Her behavior is normal.  Patient has been on affect.  She has poor insight and judgment.    ED Course  Procedures (including critical care time) Labs Review Labs Reviewed - No data to display  Imaging Review Dg Chest 2 View  10/13/2014   CLINICAL DATA:  Chest pain and shortness of breath.  Weakness.  EXAM: CHEST  2 VIEW  COMPARISON:  Chest x-ray dated 10/20/2012  FINDINGS: The heart size and mediastinal contours are within normal limits. Both lungs are clear. The visualized skeletal structures are unremarkable.  IMPRESSION: Normal exam.   Electronically Signed   By: Rozetta Nunnery M.D.   On: 10/13/2014 12:08     EKG Interpretation None      MDM   Final diagnoses:  Insomnia    63 year old female requesting sleep medications.  I have advised the patient that I do not think it is in her best interest to give her a 1 time prescription for her nighttime medicines as it is already 2:30 in the morning, by the time she receives her medications and get some from the pharmacy and gets into bed ill be close to 4 AM.  At this point her sleep-wake cycle will again be disrupted.  I've advised her to go home and rest in  a dark room.  She is to see her sister tomorrow and take her medications at the appropriate time.    Kalman Drape, MD 10/15/14 [phone]

## 2014-10-15 NOTE — ED Notes (Signed)
Pt states she needs a prescription for her night time medications to take to a all night pharmacy, pt states she usually takes her night time medication around 10 pm, states they are at her sisters house which she cannot get into her sisters house at this time, pt states she cannot wait till morning to take her medications.

## 2014-10-15 NOTE — ED Notes (Signed)
Patient is requesting psychiatric evaluation and admission for her insomnia.  Pt states she took her Seroquel, Remeron and clonazepam at 1830 this evening and dozed for a while and states has not been able to go back to sleep.

## 2014-10-15 NOTE — Discharge Instructions (Signed)
It is important for you to get back on your regular schedule.  It is not advised for you to take your nighttime medicines so late in the night.  It is recommended you go home and rest in a dark room.  Gait your medications from her sister at her regular time tomorrow evening.  Do not take them early.   Insomnia Insomnia is frequent trouble falling and/or staying asleep. Insomnia can be a long term problem or a short term problem. Both are common. Insomnia can be a short term problem when the wakefulness is related to a certain stress or worry. Long term insomnia is often related to ongoing stress during waking hours and/or poor sleeping habits. Overtime, sleep deprivation itself can make the problem worse. Every little thing feels more severe because you are overtired and your ability to cope is decreased. CAUSES   Stress, anxiety, and depression.  Poor sleeping habits.  Distractions such as TV in the bedroom.  Naps close to bedtime.  Engaging in emotionally charged conversations before bed.  Technical reading before sleep.  Alcohol and other sedatives. They may make the problem worse. They can hurt normal sleep patterns and normal dream activity.  Stimulants such as caffeine for several hours prior to bedtime.  Pain syndromes and shortness of breath can cause insomnia.  Exercise late at night.  Changing time zones may cause sleeping problems (jet lag). It is sometimes helpful to have someone observe your sleeping patterns. They should look for periods of not breathing during the night (sleep apnea). They should also look to see how long those periods last. If you live alone or observers are uncertain, you can also be observed at a sleep clinic where your sleep patterns will be professionally monitored. Sleep apnea requires a checkup and treatment. Give your caregivers your medical history. Give your caregivers observations your family has made about your sleep.  SYMPTOMS   Not feeling  rested in the morning.  Anxiety and restlessness at bedtime.  Difficulty falling and staying asleep. TREATMENT   Your caregiver may prescribe treatment for an underlying medical disorders. Your caregiver can give advice or help if you are using alcohol or other drugs for self-medication. Treatment of underlying problems will usually eliminate insomnia problems.  Medications can be prescribed for short time use. They are generally not recommended for lengthy use.  Over-the-counter sleep medicines are not recommended for lengthy use. They can be habit forming.  You can promote easier sleeping by making lifestyle changes such as:  Using relaxation techniques that help with breathing and reduce muscle tension.  Exercising earlier in the day.  Changing your diet and the time of your last meal. No night time snacks.  Establish a regular time to go to bed.  Counseling can help with stressful problems and worry.  Soothing music and white noise may be helpful if there are background noises you cannot remove.  Stop tedious detailed work at least one hour before bedtime. HOME CARE INSTRUCTIONS   Keep a diary. Inform your caregiver about your progress. This includes any medication side effects. See your caregiver regularly. Take note of:  Times when you are asleep.  Times when you are awake during the night.  The quality of your sleep.  How you feel the next day. This information will help your caregiver care for you.  Get out of bed if you are still awake after 15 minutes. Read or do some quiet activity. Keep the lights down. Wait until you  feel sleepy and go back to bed.  Keep regular sleeping and waking hours. Avoid naps.  Exercise regularly.  Avoid distractions at bedtime. Distractions include watching television or engaging in any intense or detailed activity like attempting to balance the household checkbook.  Develop a bedtime ritual. Keep a familiar routine of bathing,  brushing your teeth, climbing into bed at the same time each night, listening to soothing music. Routines increase the success of falling to sleep faster.  Use relaxation techniques. This can be using breathing and muscle tension release routines. It can also include visualizing peaceful scenes. You can also help control troubling or intruding thoughts by keeping your mind occupied with boring or repetitive thoughts like the old concept of counting sheep. You can make it more creative like imagining planting one beautiful flower after another in your backyard garden.  During your day, work to eliminate stress. When this is not possible use some of the previous suggestions to help reduce the anxiety that accompanies stressful situations. MAKE SURE YOU:   Understand these instructions.  Will watch your condition.  Will get help right away if you are not doing well or get worse. Document Released: 11/30/2000 Document Revised: 02/25/2012 Document Reviewed: 12/31/2007 [facility] Patient Information 2015 [place], [place]. This information is not intended to replace advice given to you by your health care provider. Make sure you discuss any questions you have with your health care provider.

## 2014-10-15 NOTE — ED Notes (Signed)
Patient states she needs a prescription for her night time medications. Patient states she needs Seroquel, Remeron, and clonazepam. Patient states her medications are at her sisters home and she can not get to them. Patient states she will have access to her medications in the morning.

## 2014-10-16 LAB — COMPREHENSIVE METABOLIC PANEL
ALBUMIN: 4 g/dL (ref 3.5–5.2)
ALT: 23 U/L (ref 0–35)
AST: 55 U/L — ABNORMAL HIGH (ref 0–37)
Alkaline Phosphatase: 74 U/L (ref 39–117)
Anion gap: 14 (ref 5–15)
BUN: 8 mg/dL (ref 6–23)
CO2: 24 meq/L (ref 19–32)
Calcium: 9.3 mg/dL (ref 8.4–10.5)
Chloride: 104 mEq/L (ref 96–112)
Creatinine, Ser: 0.68 mg/dL (ref 0.50–1.10)
GFR calc Af Amer: >90 mL/min (ref >90)
GFR calc non Af Amer: >90 mL/min (ref >90)
Glucose, Bld: 128 mg/dL — ABNORMAL HIGH (ref 70–99)
Potassium: 3.2 mEq/L — ABNORMAL LOW (ref 3.7–5.3)
SODIUM: 142 meq/L (ref 137–147)
Total Bilirubin: 0.4 mg/dL (ref 0.3–1.2)
Total Protein: 6.9 g/dL (ref 6.0–8.3)

## 2014-10-16 LAB — ETHANOL

## 2014-10-16 LAB — TSH: TSH: 2.87 u[IU]/mL (ref 0.350–4.500)

## 2014-10-16 NOTE — [facility] Counselor (Signed)
This Probation officer talked with Dr. Lita Mains about consult.  Advised that H&P states that pt wants to talk with [facility] about her insomnia and medication to correct it.  This Probation officer informed EDP that no consult is necessary for this pt because she meets no criteria for inpt admission.  Dr. Lita Mains is completing an addt'l work up for pt --checking pt.'s TSH and will remove TTS consult.

## 2014-10-16 NOTE — ED Notes (Signed)
Awaiting TTS consult. This Probation officer has spoken with Kendra Simmons from [facility] and will come and evaluate pt

## 2014-10-16 NOTE — ED Provider Notes (Signed)
Pt became frustrated and would like to go home. She will need to follow up with her pcp in regards to her [place], MD 10/16/14 [phone]

## 2014-10-16 NOTE — ED Provider Notes (Signed)
CSN: [id number]     Arrival date & time 10/15/14  2143 History   First MD Initiated Contact with Patient 10/16/14 0501     Chief Complaint  Patient presents with  . Insomnia     (Consider location/radiation/quality/duration/timing/severity/associated sxs/prior Treatment) HPI Patient presents with several days of insomnia. States she is able to sleep sporadically. Patient states she took her Seroquel, Remeron and clonazepam at 1830 this evening. She denies anxiety, increased depression, fever or chills, nausea or vomiting. She denies any hallucinations. No SI/HI Past Medical History  Diagnosis Date  . High cholesterol   . Depression   . Bipolar 1 disorder   . Anxiety   . GERD (gastroesophageal reflux disease)   . Insomnia    Past Surgical History  Procedure Laterality Date  . Cosmetic surgery    . Cesarean section    . Laproscopy     Family History  Problem Relation Age of Onset  . Sleep apnea Father   . Alcohol abuse Father   . Diabetes Mother   . Diabetes Sister   . Diabetes Maternal Uncle   . Diabetes Cousin    History  Substance Use Topics  . Smoking status: Never Smoker   . Smokeless tobacco: Never Used  . Alcohol Use: No   OB History   Grav Para Term Preterm Abortions TAB SAB Ect Mult Living                 Review of Systems  Constitutional: Negative for fever and chills.  Cardiovascular: Negative for chest pain.  Gastrointestinal: Negative for nausea, vomiting and abdominal pain.  Skin: Negative for rash.  Psychiatric/Behavioral: Positive for sleep disturbance. Negative for suicidal ideas, hallucinations, self-injury and agitation. The patient is not nervous/anxious and is not hyperactive.   All other systems reviewed and are negative.     Allergies  Review of patient's allergies indicates no known allergies.  Home Medications   Prior to Admission medications   Medication Sig Start Date End Date Taking? Authorizing Provider  Cyanocobalamin  (VITAMIN B-12 IJ) Inject as directed once a week. On Saturday   Yes Historical Provider, MD  omeprazole (PRILOSEC) 20 MG capsule Take 1 capsule (20 mg total) by mouth daily. For acid reflux 07/26/14  Yes Encarnacion Slates, NP  pantoprazole (PROTONIX) 20 MG tablet Take 1 tablet (20 mg total) by mouth daily. 10/13/14  Yes Jennifer L Piepenbrink, PA-C  POTASSIUM PO Take 1 tablet by mouth daily.   Yes Historical Provider, MD  pravastatin (PRAVACHOL) 20 MG tablet Take 1 tablet (20 mg total) by mouth at bedtime. For high cholesterol 07/26/14  Yes Encarnacion Slates, NP  sertraline (ZOLOFT) 100 MG tablet Take 100 mg by mouth daily.   Yes Historical Provider, MD  tamsulosin (FLOMAX) 0.4 MG CAPS capsule Take 1 capsule (0.4 mg total) by mouth daily. For urinary retension 07/26/14  Yes Encarnacion Slates, NP  Vitamin D, Ergocalciferol, (DRISDOL) 50000 UNITS CAPS capsule Take 1 capsule (50,000 Units total) by mouth every 7 (seven) days. On Wednesday: For bone health 07/26/14  Yes Encarnacion Slates, NP   BP 97/67  Pulse 115  Temp(Src) 97.8 F (36.6 C) (Oral)  Resp 20  Ht 5' 3.5" (1.613 m)  Wt 170 lb (77.111 kg)  BMI 29.64 kg/m2  SpO2 98% Physical Exam  Nursing note and vitals reviewed. Constitutional: She is oriented to person, place, and time. She appears well-developed and well-nourished. No distress.  HENT:  Head: Normocephalic and atraumatic.  Mouth/Throat: Oropharynx is clear and moist.  Eyes: EOM are normal. Pupils are equal, round, and reactive to light.  Neck: Normal range of motion. Neck supple.  Mild thyroid fullness.  Cardiovascular: Normal rate and regular rhythm.   Pulmonary/Chest: Effort normal and breath sounds normal. No respiratory distress. She has no wheezes. She has no rales. She exhibits no tenderness.  Abdominal: Soft. Bowel sounds are normal. She exhibits no distension and no mass. There is no tenderness. There is no rebound and no guarding.  Musculoskeletal: Normal range of motion. She exhibits no  edema and no tenderness.  Neurological: She is alert and oriented to person, place, and time.  Moves all extremities without deficit. Sensation is intact. No tremor  Skin: Skin is warm and dry. No rash noted. No erythema.  Psychiatric: She has a normal mood and affect. Her behavior is normal.    ED Course  Procedures (including critical care time) Labs Review Labs Reviewed  CBC - Abnormal; Notable for the following:    Platelets 140 (*)    All other components within normal limits  COMPREHENSIVE METABOLIC PANEL - Abnormal; Notable for the following:    Potassium 3.2 (*)    Glucose, Bld 128 (*)    AST 55 (*)    All other components within normal limits  URINE RAPID DRUG SCREEN (HOSP PERFORMED) - Abnormal; Notable for the following:    Benzodiazepines POSITIVE (*)    Barbiturates POSITIVE (*)    All other components within normal limits  ETHANOL    Imaging Review No results found.   EKG Interpretation None      MDM   Final diagnoses:  None   Signed out to oncoming MD pending TSH. If normal, patient can follow-up with her primary doctor. Continues to deny suicidal ideation, homicidal ideation, racing thoughts, depression or any new psychiatric complaints.     Julianne Rice, MD 10/16/14 [phone]

## 2014-10-17 ENCOUNTER — Ambulatory Visit (HOSPITAL_COMMUNITY)
Admission: RE | Admit: 2014-10-17 | Discharge: 2014-10-17 | Disposition: A | Discharge status: Home / Self Care | Payer: Medicare Other | Attending: Psychiatry | Admitting: Psychiatry

## 2014-10-17 DIAGNOSIS — F319 Bipolar disorder, unspecified: Secondary | ICD-10-CM | POA: Insufficient documentation

## 2014-10-17 DIAGNOSIS — K219 Gastro-esophageal reflux disease without esophagitis: Secondary | ICD-10-CM | POA: Diagnosis not present

## 2014-10-17 DIAGNOSIS — G4700 Insomnia, unspecified: Secondary | ICD-10-CM | POA: Insufficient documentation

## 2014-10-17 DIAGNOSIS — F419 Anxiety disorder, unspecified: Secondary | ICD-10-CM | POA: Diagnosis not present

## 2014-10-17 DIAGNOSIS — F329 Major depressive disorder, single episode, unspecified: Secondary | ICD-10-CM | POA: Diagnosis not present

## 2014-10-17 DIAGNOSIS — G479 Sleep disorder, unspecified: Secondary | ICD-10-CM | POA: Diagnosis present

## 2014-10-17 DIAGNOSIS — E780 Pure hypercholesterolemia: Secondary | ICD-10-CM | POA: Insufficient documentation

## 2014-10-17 NOTE — BH Assessment (Signed)
Tele Assessment Note   Kendra Simmons is an 63 y.o. female who is a patient of Dr. Adele Schilder, and came to [facility] requesting admission to help her sleep disturbance.  She was seen in the ED yesterday and discharged due to lack of IP criteria.  Pt says she was at patient at [facility] in July for 10 days for depression, but denies depressive symptoms at this time. Pt says she saw Dr. Adele Schilder last Monday and he adjusted her medications, which helped, but she is still having problems sleeping.  She says she is sleeping a few hours here and there, but cannot be specific about how many hours she is getting.  Pt denies SI, HI, A/V hallucinations, SA, hx of violence or depression.  Pt is dressed casually, with good eye contact, normal movement, and does not appear to be responding to internal stimuli. Pt says she lives alone, and that when it rains it is particularly difficulty to sleep.  She says she can sometimes go to her sister's house, but that her kids are loud.  Writer consulted Josephine, NP, who agrees that pt does not meet IP criteria at this time, and recommended that she call Dr. Adele Schilder in the am.  Writer discussed different options including getting a white noise machine or a fan to see if that would help.  Pt verbalized understanding of instructions and will be d/c home and call Dr. Adele Schilder in am.  Axis I: Insomnia Axis II: Deferred Axis III:  Past Medical History  Diagnosis Date  . High cholesterol   . Depression   . Bipolar 1 disorder   . Anxiety   . GERD (gastroesophageal reflux disease)   . Insomnia    Axis IV: none known Axis V: 51-60 moderate symptoms  Past Medical History:  Past Medical History  Diagnosis Date  . High cholesterol   . Depression   . Bipolar 1 disorder   . Anxiety   . GERD (gastroesophageal reflux disease)   . Insomnia     Past Surgical History  Procedure Laterality Date  . Cosmetic surgery    . Cesarean section    . Laproscopy      Family History:  Family  History  Problem Relation Age of Onset  . Sleep apnea Father   . Alcohol abuse Father   . Diabetes Mother   . Diabetes Sister   . Diabetes Maternal Uncle   . Diabetes Cousin     Social History:  reports that she has never smoked. She has never used smokeless tobacco. She reports that she does not drink alcohol or use illicit drugs.  Additional Social History:  Alcohol / Drug Use Pain Medications: denies Prescriptions: denies Over the Counter: denies History of alcohol / drug use?: No history of alcohol / drug abuse Longest period of sobriety (when/how long): denies Negative Consequences of Use:  (denies) Withdrawal Symptoms:  (denies)  CIWA:   COWS:    PATIENT STRENGTHS: (choose at least two) Average or above average intelligence Capable of independent living Financial means Supportive family/friends Work skills  Allergies: No Known Allergies  Home Medications:  (Not in a hospital admission)  OB/GYN Status:  No LMP recorded. Patient is postmenopausal.  General Assessment Data Location of Assessment: [facility] Is this a Tele or Face-to-Face Assessment?: Face-to-Face Is this an Initial Assessment or a Re-assessment for this encounter?: Initial Assessment Living Arrangements: Alone Can pt return to current living arrangement?: Yes Admission Status: Voluntary Is patient capable of signing  voluntary admission?: Yes Transfer from: Home Referral Source: Self/Family/Friend     [place] Living Arrangements: Alone Name of Psychiatrist: [facility] Name of Therapist: none  Education Status Is patient currently in school?: No  Risk to self with the past 6 months Suicidal Ideation: No Suicidal Intent: No Is patient at risk for suicide?: No Suicidal Plan?: No Access to Means: No What has been your use of drugs/alcohol within the last 12 months?:  (none) Previous Attempts/Gestures: No Other Self Harm Risks:  (none known) Intentional Self  Injurious Behavior: None Family Suicide History: No Recent stressful life event(s):  (none) Persecutory voices/beliefs?: No Depression: No Depression Symptoms: Insomnia Substance abuse history and/or treatment for substance abuse?: No Suicide prevention information given to non-admitted patients: Not applicable  Risk to Others within the past 6 months Homicidal Ideation: No Thoughts of Harm to Others: No Current Homicidal Intent: No Current Homicidal Plan: No Access to Homicidal Means: No History of harm to others?: No Assessment of Violence: None Noted Does patient have access to weapons?: No Criminal Charges Pending?: No Does patient have a court date: No  Psychosis Hallucinations: None noted Delusions: None noted  Mental Status Report Appear/Hygiene:  (casual) Eye Contact: Good Motor Activity: Unremarkable Speech: Logical/coherent Level of Consciousness: Alert Mood: Sad Affect: Flat Anxiety Level: None Thought Processes: Coherent, Relevant Judgement: Unimpaired Orientation: Person, Place, Time, Situation Obsessive Compulsive Thoughts/Behaviors: None  Cognitive Functioning Concentration: Normal Memory: Recent Intact, Remote Intact IQ: Average Insight: Fair Impulse Control: Good Appetite: Good Weight Loss: 0 Weight Gain: 0 Sleep: Decreased Total Hours of Sleep:  (a few hours off and on throughout the day) Vegetative Symptoms: None  ADLScreening [facility]) Patient's cognitive ability adequate to safely complete daily activities?: Yes Patient able to express need for assistance with ADLs?: Yes Independently performs ADLs?: Yes (appropriate for developmental age)  Prior Inpatient Therapy Prior Inpatient Therapy: Yes Prior Therapy Dates:  (7/15) Prior Therapy Facilty/Provider(s):  [facility]) Reason for Treatment:  (depression, sleep)  Prior Outpatient Therapy Prior Outpatient Therapy: Yes Prior Therapy Dates:  (7/15 to present) Prior Therapy  Facilty/Provider(s):  (Dr. Adele Schilder) Reason for Treatment:  (depression, anxiety, sleep)  ADL Screening (condition at time of admission) Patient's cognitive ability adequate to safely complete daily activities?: Yes Is the patient deaf or have difficulty hearing?: No Does the patient have difficulty seeing, even when wearing glasses/contacts?: No Does the patient have difficulty concentrating, remembering, or making decisions?: No Patient able to express need for assistance with ADLs?: Yes Does the patient have difficulty dressing or bathing?: No Independently performs ADLs?: Yes (appropriate for developmental age) Does the patient have difficulty walking or climbing stairs?: No  Home Assistive Devices/Equipment Home Assistive Devices/Equipment: None    Abuse/Neglect Assessment (Assessment to be complete while patient is alone) Physical Abuse: Denies Verbal Abuse: Denies Sexual Abuse: Denies Exploitation of patient/patient's resources: Denies Self-Neglect: Denies Values / Beliefs Cultural Requests During Hospitalization: None Spiritual Requests During Hospitalization: None        Additional Information 1:1 In Past 12 Months?: No CIRT Risk: No Elopement Risk: No Does patient have medical clearance?: No     Disposition:  Disposition Initial Assessment Completed for this Encounter: Yes Disposition of Patient: Outpatient treatment  [facility] 10/17/2014 12:41 PM

## 2014-10-19 ENCOUNTER — Encounter (HOSPITAL_COMMUNITY): Payer: Self-pay | Admitting: Emergency Medicine

## 2014-10-19 ENCOUNTER — Emergency Department (HOSPITAL_COMMUNITY)
Admission: EM | Admit: 2014-10-19 | Discharge: 2014-10-19 | Disposition: A | Discharge status: Home / Self Care | Payer: Medicare Other | Attending: Emergency Medicine | Admitting: Emergency Medicine

## 2014-10-19 DIAGNOSIS — F319 Bipolar disorder, unspecified: Secondary | ICD-10-CM | POA: Diagnosis not present

## 2014-10-19 DIAGNOSIS — F410 Panic disorder [episodic paroxysmal anxiety] without agoraphobia: Secondary | ICD-10-CM | POA: Insufficient documentation

## 2014-10-19 DIAGNOSIS — Z79899 Other long term (current) drug therapy: Secondary | ICD-10-CM | POA: Diagnosis not present

## 2014-10-19 DIAGNOSIS — F411 Generalized anxiety disorder: Secondary | ICD-10-CM | POA: Diagnosis not present

## 2014-10-19 DIAGNOSIS — G4700 Insomnia, unspecified: Secondary | ICD-10-CM | POA: Insufficient documentation

## 2014-10-19 DIAGNOSIS — E780 Pure hypercholesterolemia: Secondary | ICD-10-CM | POA: Diagnosis not present

## 2014-10-19 DIAGNOSIS — K219 Gastro-esophageal reflux disease without esophagitis: Secondary | ICD-10-CM | POA: Diagnosis not present

## 2014-10-19 DIAGNOSIS — F23 Brief psychotic disorder: Secondary | ICD-10-CM | POA: Diagnosis not present

## 2014-10-19 DIAGNOSIS — F419 Anxiety disorder, unspecified: Secondary | ICD-10-CM | POA: Diagnosis not present

## 2014-10-19 NOTE — ED Provider Notes (Signed)
CSN: [id number]     Arrival date & time 10/19/14  1754 History   First MD Initiated Contact with Patient 10/19/14 1823     Chief Complaint  Patient presents with  . Anxiety     (Consider location/radiation/quality/duration/timing/severity/associated sxs/prior Treatment) HPI Comments: Kendra Simmons is a 63 y.o. female with a PMHx of HLD, depression, bipolar 1, anxiety, GERD, and insomnia, who presents to the ED via EMS with complaints of anxiety attack after drinking 2 sips of decaf coffee this afternoon. She states she felt "jacked up" just like her prior anxiety attacks, and that she got scared so she called EMS. States her heart felt like it was racing, but this resolved on the ride here. Reports that at this time she feels improved and without any ongoing anxiety, without any intervention. Takes klonopin at home for anxiety, and follows up with Dr. Adele Schilder for anxiety. Denies any complaints at this time and would like to go home. Denies SI/HI/AVH.   Patient is a 63 y.o. female presenting with anxiety. The history is provided by the patient. No language interpreter was used.  Anxiety This is a chronic problem. The current episode started today. The problem occurs intermittently. The problem has been resolved. Pertinent negatives include no abdominal pain, arthralgias, change in bowel habit, chest pain, chills, diaphoresis, fever, headaches, myalgias, nausea, numbness, vertigo, visual change, vomiting or weakness. Exacerbated by: coffee. She has tried nothing for the symptoms.    Past Medical History  Diagnosis Date  . High cholesterol   . Depression   . Bipolar 1 disorder   . Anxiety   . GERD (gastroesophageal reflux disease)   . Insomnia    Past Surgical History  Procedure Laterality Date  . Cosmetic surgery    . Cesarean section    . Laproscopy     Family History  Problem Relation Age of Onset  . Sleep apnea Father   . Alcohol abuse Father   . Diabetes Mother   . Diabetes  Sister   . Diabetes Maternal Uncle   . Diabetes Cousin    History  Substance Use Topics  . Smoking status: Never Smoker   . Smokeless tobacco: Never Used  . Alcohol Use: No   OB History    No data available     Review of Systems  Constitutional: Negative for fever, chills and diaphoresis.  Respiratory: Negative for chest tightness and shortness of breath.   Cardiovascular: Negative for chest pain.  Gastrointestinal: Negative for nausea, vomiting, abdominal pain and change in bowel habit.  Musculoskeletal: Negative for myalgias and arthralgias.  Skin: Negative for color change.  Neurological: Negative for dizziness, vertigo, tremors, syncope, weakness, light-headedness, numbness and headaches.  Psychiatric/Behavioral: Negative for suicidal ideas, hallucinations, confusion, self-injury and agitation.   10 Systems reviewed and are negative for acute change except as noted in the HPI.    Allergies  Review of patient's allergies indicates no known allergies.  Home Medications   Prior to Admission medications   Medication Sig Start Date End Date Taking? Authorizing Provider  clonazePAM (KLONOPIN) 0.5 MG tablet Take 0.5-1 mg by mouth 2 (two) times daily. Take 1 tablet in the morning= 0.5mg , and take 2 tablets at bedtime= 1mg    Yes Historical Provider, MD  Cyanocobalamin (VITAMIN B-12 IJ) Inject as directed once a week. On Saturday   Yes Historical Provider, MD  mirtazapine (REMERON) 30 MG tablet Take 30 mg by mouth at bedtime.   Yes Historical Provider, MD  omeprazole ([place])  20 MG capsule Take 1 capsule (20 mg total) by mouth daily. For acid reflux 07/26/14  Yes Encarnacion Slates, NP  pantoprazole (PROTONIX) 20 MG tablet Take 1 tablet (20 mg total) by mouth daily. Patient taking differently: Take 20 mg by mouth daily as needed for heartburn or indigestion.  10/13/14  Yes Jennifer L Piepenbrink, PA-C  POTASSIUM PO Take 1 tablet by mouth once a week.    Yes Historical Provider, MD   pravastatin (PRAVACHOL) 20 MG tablet Take 1 tablet (20 mg total) by mouth at bedtime. For high cholesterol 07/26/14  Yes Encarnacion Slates, NP  QUEtiapine (SEROQUEL) 100 MG tablet Take 100-200 mg by mouth at bedtime. Take 1 to 2 tablets at bedtime.   Yes Historical Provider, MD  sertraline (ZOLOFT) 100 MG tablet Take 100 mg by mouth daily.   Yes Historical Provider, MD  tamsulosin (FLOMAX) 0.4 MG CAPS capsule Take 1 capsule (0.4 mg total) by mouth daily. For urinary retension 07/26/14  Yes Encarnacion Slates, NP  Vitamin D, Ergocalciferol, (DRISDOL) 50000 UNITS CAPS capsule Take 1 capsule (50,000 Units total) by mouth every 7 (seven) days. On Wednesday: For bone health 07/26/14  Yes Encarnacion Slates, NP   BP 128/66 mmHg  Pulse 97  Temp(Src) 97.8 F (36.6 C) (Oral)  SpO2 98% Physical Exam  Constitutional: She is oriented to person, place, and time. Vital signs are normal. She appears well-developed and well-nourished.  Non-toxic appearance. No distress.  Afebrile, nontoxic, NAD  HENT:  Head: Normocephalic and atraumatic.  Mouth/Throat: Oropharynx is clear and moist and mucous membranes are normal.  Eyes: Conjunctivae and EOM are normal. Right eye exhibits no discharge. Left eye exhibits no discharge.  Neck: Normal range of motion. Neck supple.  Cardiovascular: Normal rate, regular rhythm, normal heart sounds and intact distal pulses.  Exam reveals no gallop and no friction rub.   No murmur heard. Pulmonary/Chest: Effort normal and breath sounds normal. No respiratory distress. She has no decreased breath sounds. She has no wheezes. She has no rhonchi. She has no rales.  Abdominal: Soft. Normal appearance and bowel sounds are normal. She exhibits no distension. There is no tenderness. There is no rigidity, no rebound, no guarding, no CVA tenderness, no tenderness at McBurney's point and negative Murphy's sign.  Musculoskeletal: Normal range of motion.  Neurological: She is alert and oriented to person,  place, and time. She has normal strength. No sensory deficit.  Skin: Skin is warm, dry and intact. No rash noted.  Psychiatric: She has a normal mood and affect. Her speech is normal and behavior is normal. Thought content normal.  Calm, cooperative and pleasant. Denies SI/HI/AVH  Nursing note and vitals reviewed.   ED Course  Procedures (including critical care time) Labs Review Labs Reviewed - No data to display  Imaging Review No results found.   EKG Interpretation None      MDM   Final diagnoses:  Panic attacks  GAD (generalized anxiety disorder)    63y/o female with anxiety, had "anxiety attack" after decaf coffee. Has been drinking quite a bit of coffee lately. Discussed that this will aggravate her symptoms. No acute psych issues at this time, doubt need for TTS consult. Has outpt psych dr. Priscille Loveless at this time. No need for further work up. Will have her see psych dr this week for follow up. Discussed good hydration and avoidance of coffee. I explained the diagnosis and have given explicit precautions to return to the ER including for  any other new or worsening symptoms. The patient understands and accepts the medical plan as it's been dictated and I have answered their questions. Discharge instructions concerning home care and prescriptions have been given. The patient is STABLE and is discharged to home in good condition.  BP 128/66 mmHg  Pulse 97  Temp(Src) 97.8 F (36.6 C) (Oral)  SpO2 98%   Patty Sermons Linganore, PA-C 10/19/14 1914  Debby Freiberg, MD 10/22/14 [phone]

## 2014-10-19 NOTE — Discharge Instructions (Signed)
Your symptoms were likely related to drinking coffee. Avoid coffee, and stay well hydrated with water. Take your home medications as directed by your doctor. Call your psychiatrist tomorrow for a follow up appointment this week. Return to the ER for changes or worsening in your symptoms, including any suicidal or homicidal thoughts.    Panic Attacks Panic attacks are sudden, short feelings of great fear or discomfort. You may have them for no reason when you are relaxed, when you are uneasy (anxious), or when you are sleeping.  HOME CARE  Take all your medicines as told.  Check with your doctor before starting new medicines.  Keep all doctor visits. GET HELP IF:  You are not able to take your medicines as told.  Your symptoms do not get better.  Your symptoms get worse. GET HELP RIGHT AWAY IF:  Your attacks seem different than your normal attacks.  You have thoughts about hurting yourself or others.  You take panic attack medicine and you have a side effect. MAKE SURE YOU:  Understand these instructions.  Will watch your condition.  Will get help right away if you are not doing well or get worse. Document Released: 01/05/2011 Document Revised: 09/23/2013 Document Reviewed: 07/17/2013 [facility] Patient Information 2015 [place], [place]. This information is not intended to replace advice given to you by your health care provider. Make sure you discuss any questions you have with your health care provider.  Generalized Anxiety Disorder Generalized anxiety disorder (GAD) is a mental disorder. It interferes with life functions, including relationships, work, and school. GAD is different from normal anxiety, which everyone experiences at some point in their lives in response to specific life events and activities. Normal anxiety actually helps [place] prepare for and get through these life events and activities. Normal anxiety goes away after the event or activity is over.  GAD causes  anxiety that is not necessarily related to specific events or activities. It also causes excess anxiety in proportion to specific events or activities. The anxiety associated with GAD is also difficult to control. GAD can vary from mild to severe. People with severe GAD can have intense waves of anxiety with physical symptoms (panic attacks).  SYMPTOMS The anxiety and worry associated with GAD are difficult to control. This anxiety and worry are related to many life events and activities and also occur more days than not for 6 months or longer. People with GAD also have three or more of the following symptoms (one or more in children):  Restlessness.   Fatigue.  Difficulty concentrating.   Irritability.  Muscle tension.  Difficulty sleeping or unsatisfying sleep. DIAGNOSIS GAD is diagnosed through an assessment by your health care provider. Your health care provider will ask you questions aboutyour mood,physical symptoms, and events in your life. Your health care provider may ask you about your medical history and use of alcohol or drugs, including prescription medicines. Your health care provider may also do a physical exam and blood tests. Certain medical conditions and the use of certain substances can cause symptoms similar to those associated with GAD. Your health care provider may refer you to a mental health specialist for further evaluation. TREATMENT The following therapies are usually used to treat GAD:   Medication. Antidepressant medication usually is prescribed for long-term daily control. Antianxiety medicines may be added in severe cases, especially when panic attacks occur.   Talk therapy (psychotherapy). Certain types of talk therapy can be helpful in treating GAD by providing support, education, and  guidance. A form of talk therapy called cognitive behavioral therapy can teach you healthy ways to think about and react to daily life events and activities.  Stress  managementtechniques. These include yoga, meditation, and exercise and can be very helpful when they are practiced regularly. A mental health specialist can help determine which treatment is best for you. Some people see improvement with one therapy. However, other people require a combination of therapies. Document Released: 03/30/2013 Document Revised: 04/19/2014 Document Reviewed: 03/30/2013 [facility] Patient Information 2015 [place], [place]. This information is not intended to replace advice given to you by your health care provider. Make sure you discuss any questions you have with your health care provider.

## 2014-10-19 NOTE — ED Notes (Signed)
Per EMS: pt from home, c/o anxiety, hx of same. Pt states she took a sip of decaf coffee and states sx started afterwards. Pt very paranoid with EMS, denies SI/HI.

## 2014-10-21 ENCOUNTER — Emergency Department (HOSPITAL_COMMUNITY)
Admission: EM | Admit: 2014-10-21 | Discharge: 2014-10-22 | Disposition: A | Discharge status: Home / Self Care | Payer: Medicare Other | Attending: Emergency Medicine | Admitting: Emergency Medicine

## 2014-10-21 ENCOUNTER — Encounter (HOSPITAL_COMMUNITY): Payer: Self-pay | Admitting: Nurse Practitioner

## 2014-10-21 DIAGNOSIS — K219 Gastro-esophageal reflux disease without esophagitis: Secondary | ICD-10-CM | POA: Diagnosis not present

## 2014-10-21 DIAGNOSIS — Z79899 Other long term (current) drug therapy: Secondary | ICD-10-CM | POA: Insufficient documentation

## 2014-10-21 DIAGNOSIS — R55 Syncope and collapse: Secondary | ICD-10-CM | POA: Diagnosis not present

## 2014-10-21 DIAGNOSIS — E780 Pure hypercholesterolemia: Secondary | ICD-10-CM | POA: Diagnosis not present

## 2014-10-21 DIAGNOSIS — F411 Generalized anxiety disorder: Secondary | ICD-10-CM | POA: Insufficient documentation

## 2014-10-21 DIAGNOSIS — F319 Bipolar disorder, unspecified: Secondary | ICD-10-CM | POA: Diagnosis not present

## 2014-10-21 DIAGNOSIS — G4700 Insomnia, unspecified: Secondary | ICD-10-CM

## 2014-10-21 DIAGNOSIS — R51 Headache: Secondary | ICD-10-CM | POA: Diagnosis not present

## 2014-10-21 DIAGNOSIS — R404 Transient alteration of awareness: Secondary | ICD-10-CM | POA: Diagnosis not present

## 2014-10-21 NOTE — ED Notes (Signed)
Bed: WA21 Expected date:  Expected time:  Means of arrival:  Comments: EMS/insomnia and depression

## 2014-10-21 NOTE — Discharge Instructions (Signed)
1. Medications: usual home medications 2. Treatment: rest, drink plenty of fluids, take medications as prescribed 3. Follow Up: Please followup with your primary doctor and Dr. Adele Schilder in 3 days for discussion of your diagnoses and further evaluation after today's visit; if you do not have a primary care doctor use the resource guide provided to find one;    Insomnia Insomnia is frequent trouble falling and/or staying asleep. Insomnia can be a long term problem or a short term problem. Both are common. Insomnia can be a short term problem when the wakefulness is related to a certain stress or worry. Long term insomnia is often related to ongoing stress during waking hours and/or poor sleeping habits. Overtime, sleep deprivation itself can make the problem worse. Every little thing feels more severe because you are overtired and your ability to cope is decreased. CAUSES   Stress, anxiety, and depression.  Poor sleeping habits.  Distractions such as TV in the bedroom.  Naps close to bedtime.  Engaging in emotionally charged conversations before bed.  Technical reading before sleep.  Alcohol and other sedatives. They may make the problem worse. They can hurt normal sleep patterns and normal dream activity.  Stimulants such as caffeine for several hours prior to bedtime.  Pain syndromes and shortness of breath can cause insomnia.  Exercise late at night.  Changing time zones may cause sleeping problems (jet lag). It is sometimes helpful to have someone observe your sleeping patterns. They should look for periods of not breathing during the night (sleep apnea). They should also look to see how long those periods last. If you live alone or observers are uncertain, you can also be observed at a sleep clinic where your sleep patterns will be professionally monitored. Sleep apnea requires a checkup and treatment. Give your caregivers your medical history. Give your caregivers observations your  family has made about your sleep.  SYMPTOMS   Not feeling rested in the morning.  Anxiety and restlessness at bedtime.  Difficulty falling and staying asleep. TREATMENT   Your caregiver may prescribe treatment for an underlying medical disorders. Your caregiver can give advice or help if you are using alcohol or other drugs for self-medication. Treatment of underlying problems will usually eliminate insomnia problems.  Medications can be prescribed for short time use. They are generally not recommended for lengthy use.  Over-the-counter sleep medicines are not recommended for lengthy use. They can be habit forming.  You can promote easier sleeping by making lifestyle changes such as:  Using relaxation techniques that help with breathing and reduce muscle tension.  Exercising earlier in the day.  Changing your diet and the time of your last meal. No night time snacks.  Establish a regular time to go to bed.  Counseling can help with stressful problems and worry.  Soothing music and white noise may be helpful if there are background noises you cannot remove.  Stop tedious detailed work at least one hour before bedtime. HOME CARE INSTRUCTIONS   Keep a diary. Inform your caregiver about your progress. This includes any medication side effects. See your caregiver regularly. Take note of:  Times when you are asleep.  Times when you are awake during the night.  The quality of your sleep.  How you feel the next day. This information will help your caregiver care for you.  Get out of bed if you are still awake after 15 minutes. Read or do some quiet activity. Keep the lights down. Wait until you feel  sleepy and go back to bed.  Keep regular sleeping and waking hours. Avoid naps.  Exercise regularly.  Avoid distractions at bedtime. Distractions include watching television or engaging in any intense or detailed activity like attempting to balance the household  checkbook.  Develop a bedtime ritual. Keep a familiar routine of bathing, brushing your teeth, climbing into bed at the same time each night, listening to soothing music. Routines increase the success of falling to sleep faster.  Use relaxation techniques. This can be using breathing and muscle tension release routines. It can also include visualizing peaceful scenes. You can also help control troubling or intruding thoughts by keeping your mind occupied with boring or repetitive thoughts like the old concept of counting sheep. You can make it more creative like imagining planting one beautiful flower after another in your backyard garden.  During your day, work to eliminate stress. When this is not possible use some of the previous suggestions to help reduce the anxiety that accompanies stressful situations. MAKE SURE YOU:   Understand these instructions.  Will watch your condition.  Will get help right away if you are not doing well or get worse. Document Released: 11/30/2000 Document Revised: 02/25/2012 Document Reviewed: 12/31/2007 [facility] Patient Information 2015 [place], [place]. This information is not intended to replace advice given to you by your health care provider. Make sure you discuss any questions you have with your health care provider.

## 2014-10-21 NOTE — ED Provider Notes (Signed)
CSN: [id number]     Arrival date & time 10/21/14  2155 History   First MD Initiated Contact with Patient 10/21/14 2207     Chief Complaint  Patient presents with  . Can't Sleep      (Consider location/radiation/quality/duration/timing/severity/associated sxs/prior Treatment) The history is provided by the patient and medical records. No language interpreter was used.     Kendra Simmons is a 63 y.o. female  with a hx of HLD, depression, bipolar 1, anxiety, GERD, and insomnia presents to the Emergency Department complaining of gradual, persistent, progressively worsening insomnia. Pt reports this is a longstanding problem.  Pt reports she drank coffee last night and did not sleep all night.  She reports she took her sleeping medicine around 8am this morning and has felt sleepy all day.  (She reports to the nurse that she took her sleeping medicine at 2:30 PM today.)  She reports she feels sleepy now, but can't go to sleep.  Pt reports she does not feel anxious at this time.  She reports she has a generalized headache the same as previous headaches, but does not want any medication for her headache.  She reports drinking caffeine makes her head hurt.  No aggravating or alleviating factors.  Pt denies fever, chills, neck pain chest pain, SOB, abd pain, N/V/D, weakness, dizziness, syncope.  Pt reports she feels depressed but denies SI, HI, auditory or visual hallucinations.     Past Medical History  Diagnosis Date  . High cholesterol   . Depression   . Bipolar 1 disorder   . Anxiety   . GERD (gastroesophageal reflux disease)   . Insomnia    Past Surgical History  Procedure Laterality Date  . Cosmetic surgery    . Cesarean section    . Laproscopy     Family History  Problem Relation Age of Onset  . Sleep apnea Father   . Alcohol abuse Father   . Diabetes Mother   . Diabetes Sister   . Diabetes Maternal Uncle   . Diabetes Cousin    History  Substance Use Topics  . Smoking  status: Never Smoker   . Smokeless tobacco: Never Used  . Alcohol Use: No   OB History    No data available     Review of Systems  Constitutional: Negative for fever, diaphoresis, appetite change, fatigue and unexpected weight change.  HENT: Negative for mouth sores.   Eyes: Negative for visual disturbance.  Respiratory: Negative for cough, chest tightness, shortness of breath and wheezing.   Cardiovascular: Negative for chest pain.  Gastrointestinal: Negative for nausea, vomiting, abdominal pain, diarrhea and constipation.  Endocrine: Negative for polydipsia, polyphagia and polyuria.  Genitourinary: Negative for dysuria, urgency, frequency and hematuria.  Musculoskeletal: Negative for back pain and neck stiffness.  Skin: Negative for rash.  Allergic/Immunologic: Negative for immunocompromised state.  Neurological: Positive for headaches. Negative for syncope and light-headedness.  Hematological: Does not bruise/bleed easily.  Psychiatric/Behavioral: Negative for sleep disturbance. The patient is not nervous/anxious.       Allergies  Review of patient's allergies indicates no known allergies.  Home Medications   Prior to Admission medications   Medication Sig Start Date End Date Taking? Authorizing Provider  clonazePAM (KLONOPIN) 0.5 MG tablet Take 0.5-1 mg by mouth 2 (two) times daily. Take 1 tablet in the morning= 0.5mg , and take 2 tablets at bedtime= 1mg    Yes Historical Provider, MD  Cyanocobalamin (VITAMIN B-12 IJ) Inject as directed once a week. On Saturday  Yes Historical Provider, MD  mirtazapine (REMERON) 30 MG tablet Take 30 mg by mouth at bedtime.   Yes Historical Provider, MD  omeprazole (PRILOSEC) 20 MG capsule Take 1 capsule (20 mg total) by mouth daily. For acid reflux 07/26/14  Yes Encarnacion Slates, NP  pantoprazole (PROTONIX) 20 MG tablet Take 1 tablet (20 mg total) by mouth daily. Patient taking differently: Take 20 mg by mouth daily as needed for heartburn or  indigestion.  10/13/14  Yes Jennifer L Piepenbrink, PA-C  POTASSIUM PO Take 1 tablet by mouth once a week.    Yes Historical Provider, MD  pravastatin (PRAVACHOL) 20 MG tablet Take 1 tablet (20 mg total) by mouth at bedtime. For high cholesterol 07/26/14  Yes Encarnacion Slates, NP  QUEtiapine (SEROQUEL) 100 MG tablet Take 100-200 mg by mouth at bedtime. Take 1 to 2 tablets at bedtime.   Yes Historical Provider, MD  sertraline (ZOLOFT) 100 MG tablet Take 100 mg by mouth daily.   Yes Historical Provider, MD  tamsulosin (FLOMAX) 0.4 MG CAPS capsule Take 1 capsule (0.4 mg total) by mouth daily. For urinary retension 07/26/14  Yes Encarnacion Slates, NP  Vitamin D, Ergocalciferol, (DRISDOL) 50000 UNITS CAPS capsule Take 1 capsule (50,000 Units total) by mouth every 7 (seven) days. On Wednesday: For bone health 07/26/14  Yes Encarnacion Slates, NP   BP 152/96 mmHg  Pulse 112  Temp(Src) 97.5 F (36.4 C) (Oral)  Resp 18  SpO2 100% Physical Exam  Constitutional: She is oriented to person, place, and time. She appears well-developed and well-nourished. No distress.  Awake, alert, nontoxic appearance  HENT:  Head: Normocephalic and atraumatic.  Mouth/Throat: Oropharynx is clear and moist. No oropharyngeal exudate.  Eyes: Conjunctivae and EOM are normal. Pupils are equal, round, and reactive to light. No scleral icterus.  No horizontal, vertical or rotational nystagmus  Neck: Normal range of motion. Neck supple.  Full active and passive ROM without pain No midline or paraspinal tenderness No nuchal rigidity or meningeal signs  Cardiovascular: Normal rate, regular rhythm, normal heart sounds and intact distal pulses.   No tachycardia  Pulmonary/Chest: Effort normal and breath sounds normal. No respiratory distress. She has no wheezes. She has no rales.  Equal chest expansion  Abdominal: Soft. Bowel sounds are normal. She exhibits no mass. There is no tenderness. There is no rebound and no guarding.   Musculoskeletal: Normal range of motion. She exhibits no edema.  Lymphadenopathy:    She has no cervical adenopathy.  Neurological: She is alert and oriented to person, place, and time. She has normal reflexes. No cranial nerve deficit. She exhibits normal muscle tone. Coordination normal.  Mental Status:  Alert, oriented, thought content appropriate. Speech fluent without evidence of aphasia. Able to follow 2 step commands without difficulty.  Cranial Nerves:  II:  Peripheral visual fields grossly normal, pupils equal, round, reactive to light III,IV, VI: ptosis not present, extra-ocular motions intact bilaterally  V,VII: smile symmetric, facial light touch sensation equal VIII: hearing grossly normal bilaterally  IX,X: gag reflex present  XI: bilateral shoulder shrug equal and strong XII: midline tongue extension  Motor:  5/5 in upper and lower extremities bilaterally including strong and equal grip strength and dorsiflexion/plantar flexion Sensory: Pinprick and light touch normal in all extremities.  Deep Tendon Reflexes: 2+ and symmetric  Cerebellar: normal finger-to-nose with bilateral upper extremities Gait: normal gait and balance CV: distal pulses palpable throughout   Skin: Skin is warm and dry. No  rash noted. She is not diaphoretic.  Psychiatric: She has a normal mood and affect. Her behavior is normal. Judgment and thought content normal.  Nursing note and vitals reviewed.   ED Course  Procedures (including critical care time) Labs Review Labs Reviewed - No data to display  Imaging Review No results found.   EKG Interpretation None      MDM   Final diagnoses:  Insomnia  GAD (generalized anxiety disorder)   Burman Riis Hilgers presents for complaints of insomnia. She's been here numerous times for the same. Patient does report that she has a headache today. Normal neurologic exam without focal neuro deficits, no nuchal rigidity or meningeal signs suggest  meningitis. No rash, no systemic symptoms and benign exam.  Patient request admission to behavioral health Hospital for treatment of her insomnia however I discussed with her that this does not meet inpatient criteria. She does regularly see Dr. Adele Schilder who has been treating her insomnia. Patient again denies suicidal ideation, homicidal ideation, auditory or visual hallucinations. Patient will be discharged home and encouraged to lie quietly in a dark room and attempt to sleep.  I have personally reviewed patient's vitals, nursing note and any pertinent labs or imaging.  I performed an undressed physical exam.    It has been determined that no acute conditions requiring further emergency intervention are present at this time. The patient/guardian have been advised of the diagnosis and plan. I reviewed all labs and imaging including any potential incidental findings. We have discussed signs and symptoms that warrant return to the ED and they are listed in the discharge instructions.       Jarrett Soho Verle Wheeling, PA-C 10/22/14 [id number]  Orpah Greek, MD 10/22/14 1515

## 2014-10-21 NOTE — ED Notes (Signed)
Pt presented from home, c/o of not being able to sleep, states he took coffee then his sleep medication, states now he feels "too sleepy" like she will fall. Denies pain.

## 2014-10-21 NOTE — Progress Notes (Signed)
  CARE MANAGEMENT ED NOTE 10/21/2014  Patient:  Kendra Simmons, Kendra Simmons   Account Number:  [id number]  Date Initiated:  10/21/2014  Documentation initiated by:  Livia Snellen  Subjective/Objective Assessment:   Patient presents to ED with insomnia.     Subjective/Objective Assessment Detail:   Patient with pmhx of anxiety, insomnia, depression, Bipolar     Action/Plan:   Action/Plan Detail:   Anticipated DC Date:       Status Recommendation to Physician:   Result of Recommendation:    Other ED Services  Consult Working [place]  Other    Choice offered to / List presented to:            Status of service:  Completed, signed off  ED Comments:   ED Comments Detail:  EDCM spoek to patient at bedside.  Patient reports, "I've been feeling sleepy, so I drank a lot of caffiene.  I haven't slept in a couple of days."  Patient confirms this is a chronic issue.  Patient reports she is seen by Dr. Adele Schilder for her insomnia.  "He prescribed Seroquel, Rmeron, and klonipin to take at night time.  But they have been a little mixed up lately because I haven't beemn sleeping. Sometimes I take them at two o'clock."  Patient denies SI HI.  Patient confirms she has an appointment to see Dr. Adele Schilder on Nov 17th.  [facility] encouraged patient to call Dr. Adele Schilder to schedule an appointment sooner.  Patient agreed to this.  EDCM explained to patient that the emergency room is for treatment of emergency situations.  EDCM explained that the ED would not be able to cure her insomnia and strongly encouraged patient to take her medicine correctly aand to avoid caffiene.  Patient verbalized understanding. EDCM discussed patient with EDPA and EDRN.  No further EDCM needs at this time.

## 2014-10-22 ENCOUNTER — Encounter (HOSPITAL_COMMUNITY): Payer: Self-pay | Admitting: Licensed Clinical Social Worker

## 2014-10-22 NOTE — Progress Notes (Signed)
10/22/2014 1855pm A. Brandan Robicheaux [facility] sent email to Dr. Adele Schilder to notify of patient status.

## 2014-10-22 NOTE — ED Notes (Signed)
Called sisters pt's sister [phone]) per pt request to come pick her up for discharge, pt's sister answered, stating that her sister "has been coming to the hospital too often and she is not going to come get her" more so she advised her to use a cab "like she has previously done." Pt agreed to this and signed for discharge. Charge Nurse notified of these.

## 2014-10-27 ENCOUNTER — Telehealth (HOSPITAL_COMMUNITY): Payer: Self-pay

## 2014-10-28 NOTE — Telephone Encounter (Signed)
I returned patient's sister full to call.  She is concerned that patient is not sleeping and requesting more medication.  She has visited emergency room multiple times because of poor sleep.  She is taking Seroquel up to 200 mg along with Klonopin.  Patient used to see Dr. Erling Cruz at [facility] neurology.  I recommended to have sleep study done before changes any of her medication.  She can go up to Seroquel 300 mg however she must do is sleep study before changing or adding new medication.  Patient's sister was called [place] neurology to set up an appointment.

## 2014-11-02 ENCOUNTER — Inpatient Hospital Stay (HOSPITAL_COMMUNITY)
Admission: EM | Admit: 2014-11-02 | Discharge: 2014-11-15 | DRG: 885 | Disposition: A | Discharge status: Home / Self Care | Payer: Medicare Other | Source: Intra-hospital | Attending: Psychiatry | Admitting: Psychiatry

## 2014-11-02 ENCOUNTER — Encounter (HOSPITAL_COMMUNITY): Payer: Self-pay | Admitting: Psychiatry

## 2014-11-02 ENCOUNTER — Encounter (HOSPITAL_COMMUNITY): Payer: Self-pay | Admitting: *Deleted

## 2014-11-02 ENCOUNTER — Emergency Department (HOSPITAL_COMMUNITY)
Admission: EM | Admit: 2014-11-02 | Discharge: 2014-11-02 | Disposition: A | Discharge status: Other Acute Inpatient Hospital | Payer: Medicare Other | Source: Home / Self Care | Attending: Emergency Medicine | Admitting: Emergency Medicine

## 2014-11-02 ENCOUNTER — Ambulatory Visit (INDEPENDENT_AMBULATORY_CARE_PROVIDER_SITE_OTHER): Payer: Medicare Other | Admitting: Psychiatry

## 2014-11-02 VITALS — BP 130/97 | HR 109 | Ht 64.0 in | Wt 156.0 lb

## 2014-11-02 DIAGNOSIS — K219 Gastro-esophageal reflux disease without esophagitis: Secondary | ICD-10-CM | POA: Insufficient documentation

## 2014-11-02 DIAGNOSIS — E780 Pure hypercholesterolemia: Secondary | ICD-10-CM | POA: Diagnosis present

## 2014-11-02 DIAGNOSIS — F329 Major depressive disorder, single episode, unspecified: Secondary | ICD-10-CM | POA: Diagnosis not present

## 2014-11-02 DIAGNOSIS — Z79899 Other long term (current) drug therapy: Secondary | ICD-10-CM

## 2014-11-02 DIAGNOSIS — Z8739 Personal history of other diseases of the musculoskeletal system and connective tissue: Secondary | ICD-10-CM | POA: Insufficient documentation

## 2014-11-02 DIAGNOSIS — F331 Major depressive disorder, recurrent, moderate: Secondary | ICD-10-CM | POA: Diagnosis not present

## 2014-11-02 DIAGNOSIS — G4700 Insomnia, unspecified: Secondary | ICD-10-CM | POA: Diagnosis present

## 2014-11-02 DIAGNOSIS — R45851 Suicidal ideations: Secondary | ICD-10-CM | POA: Diagnosis not present

## 2014-11-02 DIAGNOSIS — F4310 Post-traumatic stress disorder, unspecified: Secondary | ICD-10-CM

## 2014-11-02 DIAGNOSIS — F332 Major depressive disorder, recurrent severe without psychotic features: Principal | ICD-10-CM | POA: Diagnosis present

## 2014-11-02 DIAGNOSIS — R4189 Other symptoms and signs involving cognitive functions and awareness: Secondary | ICD-10-CM

## 2014-11-02 LAB — COMPREHENSIVE METABOLIC PANEL
ALBUMIN: 4.6 g/dL (ref 3.5–5.2)
ALK PHOS: 68 U/L (ref 39–117)
ALT: 17 U/L (ref 0–35)
AST: 27 U/L (ref 0–37)
Anion gap: 23 — ABNORMAL HIGH (ref 5–15)
BILIRUBIN TOTAL: 0.5 mg/dL (ref 0.3–1.2)
BUN: 19 mg/dL (ref 6–23)
CO2: 22 mEq/L (ref 19–32)
Calcium: 10.6 mg/dL — ABNORMAL HIGH (ref 8.4–10.5)
Chloride: 103 mEq/L (ref 96–112)
Creatinine, Ser: 0.81 mg/dL (ref 0.50–1.10)
GFR calc Af Amer: 88 mL/min — ABNORMAL LOW (ref >90)
GFR calc non Af Amer: 76 mL/min — ABNORMAL LOW (ref >90)
Glucose, Bld: 163 mg/dL — ABNORMAL HIGH (ref 70–99)
POTASSIUM: 3.4 meq/L — AB (ref 3.7–5.3)
Sodium: 148 mEq/L — ABNORMAL HIGH (ref 137–147)
Total Protein: 7.8 g/dL (ref 6.0–8.3)

## 2014-11-02 LAB — RAPID URINE DRUG SCREEN, HOSP PERFORMED
AMPHETAMINES: NOT DETECTED
BARBITURATES: POSITIVE — AB
BENZODIAZEPINES: POSITIVE — AB
COCAINE: NOT DETECTED
Opiates: NOT DETECTED
TETRAHYDROCANNABINOL: NOT DETECTED

## 2014-11-02 LAB — CBC
HCT: 46.5 % — ABNORMAL HIGH (ref 36.0–46.0)
Hemoglobin: 15.7 g/dL — ABNORMAL HIGH (ref 12.0–15.0)
MCH: 29.9 pg (ref 26.0–34.0)
MCHC: 33.8 g/dL (ref 30.0–36.0)
MCV: 88.6 fL (ref 78.0–100.0)
Platelets: 168 10*3/uL (ref 150–400)
RBC: 5.25 MIL/uL — ABNORMAL HIGH (ref 3.87–5.11)
RDW: 13.6 % (ref 11.5–15.5)
WBC: 7.4 10*3/uL (ref 4.0–10.5)

## 2014-11-02 LAB — ACETAMINOPHEN LEVEL: Acetaminophen (Tylenol), Serum: <15 ug/mL (ref 10–30)

## 2014-11-02 LAB — SALICYLATE LEVEL: Salicylate Lvl: <2 mg/dL — ABNORMAL LOW (ref 2.8–20.0)

## 2014-11-02 LAB — ETHANOL: Alcohol, Ethyl (B): <11 mg/dL (ref 0–11)

## 2014-11-02 MED ORDER — SERTRALINE HCL 100 MG PO TABS
100.0000 mg | ORAL_TABLET | Freq: Every day | ORAL | Status: DC
  Administered 2014-11-03: 100 mg via ORAL
  Filled 2014-11-02 (×3): qty 1

## 2014-11-02 MED ORDER — ACETAMINOPHEN 325 MG PO TABS
650.0000 mg | ORAL_TABLET | Freq: Four times a day (QID) | ORAL | Status: DC | PRN
  Administered 2014-11-03 – 2014-11-04 (×2): 650 mg via ORAL
  Filled 2014-11-02 (×2): qty 2

## 2014-11-02 MED ORDER — PANTOPRAZOLE SODIUM 40 MG PO TBEC
80.0000 mg | DELAYED_RELEASE_TABLET | Freq: Every day | ORAL | Status: DC
  Administered 2014-11-03 – 2014-11-15 (×13): 80 mg via ORAL
  Filled 2014-11-02 (×15): qty 2

## 2014-11-02 MED ORDER — CLONAZEPAM 0.5 MG PO TABS
0.5000 mg | ORAL_TABLET | Freq: Two times a day (BID) | ORAL | Status: DC
  Administered 2014-11-02 – 2014-11-03 (×2): 0.5 mg via ORAL
  Filled 2014-11-02 (×2): qty 1

## 2014-11-02 MED ORDER — MAGNESIUM HYDROXIDE 400 MG/5ML PO SUSP: 30.0000 mL | Freq: Every day | ORAL | Status: DC | PRN

## 2014-11-02 MED ORDER — PRAVASTATIN SODIUM 20 MG PO TABS
20.0000 mg | ORAL_TABLET | Freq: Every day | ORAL | Status: DC
  Administered 2014-11-03 – 2014-11-14 (×12): 20 mg via ORAL
  Filled 2014-11-02 (×14): qty 1

## 2014-11-02 MED ORDER — ALUM & MAG HYDROXIDE-SIMETH 200-200-20 MG/5ML PO SUSP: 30.0000 mL | ORAL | Status: DC | PRN

## 2014-11-02 MED ORDER — OLANZAPINE 10 MG PO TBDP
10.0000 mg | ORAL_TABLET | Freq: Every day | ORAL | Status: DC
  Administered 2014-11-02 – 2014-11-03 (×2): 10 mg via ORAL
  Filled 2014-11-02 (×5): qty 1

## 2014-11-02 MED ORDER — MIRTAZAPINE 30 MG PO TBDP
30.0000 mg | ORAL_TABLET | Freq: Every day | ORAL | Status: DC
  Administered 2014-11-02: 30 mg via ORAL
  Filled 2014-11-02 (×2): qty 1
  Filled 2014-11-02: qty 2
  Filled 2014-11-02: qty 1

## 2014-11-02 MED ORDER — TAMSULOSIN HCL 0.4 MG PO CAPS
0.4000 mg | ORAL_CAPSULE | Freq: Every day | ORAL | Status: DC
  Administered 2014-11-03 – 2014-11-15 (×13): 0.4 mg via ORAL
  Filled 2014-11-02 (×15): qty 1

## 2014-11-02 NOTE — Tx Team (Signed)
Initial Interdisciplinary Treatment Plan   PATIENT STRESSORS: Financial difficulties Substance abuse   PATIENT STRENGTHS: Average or above average intelligence Capable of independent living General fund of knowledge Motivation for treatment/growth Physical Health Religious Affiliation Special hobby/interest Supportive family/friends Work skills   PROBLEM LIST: Problem List/Patient Goals Date to be addressed Date deferred Reason deferred Estimated date of resolution  "My sleep issues" 11/02/14           Depression 11/02/14     Increased risk for suicide 11/02/14     Concurrent med conditions 11/02/14                              DISCHARGE CRITERIA:  Ability to meet basic life and health needs Adequate post-discharge living arrangements Improved stabilization in mood, thinking, and/or behavior Medical problems require only outpatient monitoring Motivation to continue treatment in a less acute level of care Need for constant or close observation no longer present Reduction of life-threatening or endangering symptoms to within safe limits Safe-care adequate arrangements made Verbal commitment to aftercare and medication compliance  PRELIMINARY DISCHARGE PLAN: Outpatient therapy Participate in family therapy Placement in alternative living arrangements  PATIENT/FAMIILY INVOLVEMENT: This treatment plan has been presented to and reviewed with the patient, Kendra Simmons, and/or family member.  The patient and family have been given the opportunity to ask questions and make suggestions.  [name] 11/02/2014, 10:36 PM
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
## 2014-11-02 NOTE — Progress Notes (Signed)
[place] Progress Note   Kendra Simmons [id number] 63 y.o.  11/02/2014 4:06 PM  Chief Complaint:  I cannot sleep.  I have a lot of depression.  I am having suicidal thoughts.   History of Present Illness:  Kendra Simmons came with Kendra Simmons for her appointment.  Her Simmons is concerned because she is not sleeping for past few days.  She is complaining of increased depression.  Patient has multiple emergency room visits for insomnia and lately she was recommended to increase Seroquel 300 mg at bedtime.  Her Simmons is managing her medication and despite taking 300 mg Seroquel and Klonopin along with Remeron she is only sleeping 1-2 hours.  Patient appears very tired, exhausted and endorse suicidal thoughts and plan to kill herself.  She was recommended sleep study however it is not scheduled yet.  She is waiting from neurologist to set up an appointment.  Patient feels hopeless, worthless and cannot contract for safety.  She is not eating and she has lost a lot of weight.  Patient's sisters are concerned about her patient safety.  She want to try a different medication.  Patient does not feel safe going back to home.  For past 4 days patient is staying with her Simmons.  Patient denies any drinking or using any illegal substances.  She denies any hallucination or any paranoia but feels extreme depressed and tired.  Suicidal Ideation: Yes Plan Formed: patient does not disclose her plan Patient has means to carry out plan: No  Homicidal Ideation: No Plan Formed: No Patient has means to carry out plan: No   Past Psychiatric History/Hospitalization(s) Patient has at least 3 psychiatric hospitalizations.  She was admitted at [place] and recently to [facility].  She denies any history of suicidal attempt.  Patient has history of hallucinations, paranoia and severe depression.  In the past she has seen [name], [name] and [name].  She has done at least once  intensive outpatient program.  As per chart she has taken in the past doxepin, Xanax, Seroquel, amitriptyline, imipramine, Wellbutrin, Prozac however she do not remember the details. Anxiety: Yes Bipolar Disorder: No Depression: Yes Mania: No Psychosis: History of hallucinations Schizophrenia: No Personality Disorder: No Hospitalization for psychiatric illness: Yes History of Electroconvulsive Shock Therapy: No Prior Suicide Attempts: No  Medical History; Patient has high cholesterol, memory impairment, GERD and palpitation.  She has history of frequent falls and seen by a neurologist in the past.  Her primary care physician is [name] in [place], [place].  Review of Systems  Constitutional: Positive for weight loss and malaise/fatigue.  Skin: Negative.  Negative for itching and rash.  Neurological:       Neuropathy in her feet  Psychiatric/Behavioral: Positive for suicidal ideas. The patient is nervous/anxious and has insomnia.        Memory impairment   Psychiatric: Agitation: No Hallucination: No Depressed Mood: Yes Insomnia: Yes Hypersomnia: No Altered Concentration: No Feels Worthless: Yes Grandiose Ideas: No Belief In Special Powers: No New/Increased Substance Abuse: No Compulsions: No  Neurologic: Headache: No Seizure: No Paresthesias: Yes   Musculoskeletal: Strength & Muscle Tone: within normal limits Gait & Station: normal Patient leans: N/A   Outpatient Encounter Prescriptions as of 11/02/2014  Medication Sig  . clonazePAM (KLONOPIN) 0.5 MG tablet Take 0.5-1 mg by mouth 2 (two) times daily. Take 1 tablet in the morning= 0.80m, and take 2 tablets at bedtime= 154m . Cyanocobalamin (VITAMIN  B-12 IJ) Inject as directed once a week. On Saturday  . mirtazapine (REMERON) 30 MG tablet Take 30 mg by mouth at bedtime.  [name] omeprazole (PRILOSEC) 20 MG capsule Take 1 capsule (20 mg total) by mouth daily. For acid reflux  . pantoprazole (PROTONIX) 20 MG  tablet Take 1 tablet (20 mg total) by mouth daily. (Patient taking differently: Take 20 mg by mouth daily as needed for heartburn or indigestion. )  . POTASSIUM PO Take 1 tablet by mouth once a week.   . pravastatin (PRAVACHOL) 20 MG tablet Take 1 tablet (20 mg total) by mouth at bedtime. For high cholesterol  . QUEtiapine (SEROQUEL) 100 MG tablet Take 100-200 mg by mouth at bedtime. Take 1 to 2 tablets at bedtime.  . sertraline (ZOLOFT) 100 MG tablet Take 100 mg by mouth daily.  . tamsulosin (FLOMAX) 0.4 MG CAPS capsule Take 1 capsule (0.4 mg total) by mouth daily. For urinary retension  . Vitamin D, Ergocalciferol, (DRISDOL) 50000 UNITS CAPS capsule Take 1 capsule (50,000 Units total) by mouth every 7 (seven) days. On Wednesday: For bone health    Recent Results (from the past 2160 hour(s))  Troponin I     Status: None   Collection Time: 10/13/14  9:53 AM  Result Value Ref Range   Troponin I <0.30 <0.30 ng/mL    Comment:        Due to the release kinetics of cTnI, a negative result within the first hours of the onset of symptoms does not rule out myocardial infarction with certainty. If myocardial infarction is still suspected, repeat the test at appropriate intervals.  CBC     Status: Abnormal   Collection Time: 10/13/14  9:53 AM  Result Value Ref Range   WBC 6.2 4.0 - 10.5 K/uL   RBC 4.77 3.87 - 5.11 MIL/uL   Hemoglobin 14.3 12.0 - 15.0 g/dL   HCT 43.5 36.0 - 46.0 %   MCV 91.2 78.0 - 100.0 fL   MCH 30.0 26.0 - 34.0 pg   MCHC 32.9 30.0 - 36.0 g/dL   RDW 13.8 11.5 - 15.5 %   Platelets 136 (L) 150 - 400 K/uL  Basic metabolic panel     Status: Abnormal   Collection Time: 10/13/14  9:53 AM  Result Value Ref Range   Sodium 145 137 - 147 mEq/L   Potassium 3.5 (L) 3.7 - 5.3 mEq/L   Chloride 104 96 - 112 mEq/L   CO2 24 19 - 32 mEq/L   Glucose, Bld 137 (H) 70 - 99 mg/dL   BUN 4 (L) 6 - 23 mg/dL   Creatinine, Ser 0.61 0.50 - 1.10 mg/dL   Calcium 9.5 8.4 - 10.5 mg/dL   GFR calc  non Af Amer >90 >90 mL/min   GFR calc Af Amer >90 >90 mL/min    Comment: (NOTE) The eGFR has been calculated using the CKD EPI equation. This calculation has not been validated in all clinical situations. eGFR's persistently <90 mL/min signify possible Chronic Kidney Disease.   Anion gap 17 (H) 5 - 15  I-stat troponin, ED     Status: None   Collection Time: 10/13/14 10:05 AM  Result Value Ref Range   Troponin i, poc 0.00 0.00 - 0.08 ng/mL   Comment 3            Comment: Due to the release kinetics of cTnI, a negative result within the first hours of the onset of symptoms does not rule out myocardial  infarction with certainty. If myocardial infarction is still suspected, repeat the test at appropriate intervals.  Urinalysis, Routine w reflex microscopic     Status: None   Collection Time: 10/13/14 12:58 PM  Result Value Ref Range   Color, Urine YELLOW YELLOW   APPearance CLEAR CLEAR   Specific Gravity, Urine 1.007 1.005 - 1.030   pH 7.5 5.0 - 8.0   Glucose, UA NEGATIVE NEGATIVE mg/dL   Hgb urine dipstick NEGATIVE NEGATIVE   Bilirubin Urine NEGATIVE NEGATIVE   Ketones, ur NEGATIVE NEGATIVE mg/dL   Protein, ur NEGATIVE NEGATIVE mg/dL   Urobilinogen, UA 0.2 0.0 - 1.0 mg/dL   Nitrite NEGATIVE NEGATIVE   Leukocytes, UA NEGATIVE NEGATIVE    Comment: MICROSCOPIC NOT DONE ON URINES WITH NEGATIVE PROTEIN, BLOOD, LEUKOCYTES, NITRITE, OR GLUCOSE <1000 mg/dL.  Urine culture     Status: None   Collection Time: 10/13/14 12:58 PM  Result Value Ref Range   Specimen Description URINE, CATHETERIZED    Special Requests NONE    Culture  Setup Time      10/13/2014 17:54 Performed at [place]      75,000 COLONIES/ML Performed at [facility]   Culture      Multiple bacterial morphotypes present, none predominant. Suggest appropriate recollection if clinically indicated. Performed at [facility]   Report Status 10/14/2014 FINAL   Troponin I      Status: None   Collection Time: 10/13/14  1:54 PM  Result Value Ref Range   Troponin I <0.30 <0.30 ng/mL    Comment:        Due to the release kinetics of cTnI, a negative result within the first hours of the onset of symptoms does not rule out myocardial infarction with certainty. If myocardial infarction is still suspected, repeat the test at appropriate intervals.  CBC     Status: Abnormal   Collection Time: 10/15/14 11:25 PM  Result Value Ref Range   WBC 5.9 4.0 - 10.5 K/uL   RBC 4.61 3.87 - 5.11 MIL/uL   Hemoglobin 13.5 12.0 - 15.0 g/dL   HCT 41.4 36.0 - 46.0 %   MCV 89.8 78.0 - 100.0 fL   MCH 29.3 26.0 - 34.0 pg   MCHC 32.6 30.0 - 36.0 g/dL   RDW 13.9 11.5 - 15.5 %   Platelets 140 (L) 150 - 400 K/uL  Comprehensive metabolic panel     Status: Abnormal   Collection Time: 10/15/14 11:25 PM  Result Value Ref Range   Sodium 142 137 - 147 mEq/L   Potassium 3.2 (L) 3.7 - 5.3 mEq/L   Chloride 104 96 - 112 mEq/L   CO2 24 19 - 32 mEq/L   Glucose, Bld 128 (H) 70 - 99 mg/dL   BUN 8 6 - 23 mg/dL   Creatinine, Ser 0.68 0.50 - 1.10 mg/dL   Calcium 9.3 8.4 - 10.5 mg/dL   Total Protein 6.9 6.0 - 8.3 g/dL   Albumin 4.0 3.5 - 5.2 g/dL   AST 55 (H) 0 - 37 U/L   ALT 23 0 - 35 U/L   Alkaline Phosphatase 74 39 - 117 U/L   Total Bilirubin 0.4 0.3 - 1.2 mg/dL   GFR calc non Af Amer >90 >90 mL/min   GFR calc Af Amer >90 >90 mL/min    Comment: (NOTE) The eGFR has been calculated using the CKD EPI equation. This calculation has not been validated in all clinical situations. eGFR's persistently <  90 mL/min signify possible Chronic Kidney Disease.   Anion gap 14 5 - 15  Ethanol (ETOH)     Status: None   Collection Time: 10/15/14 11:25 PM  Result Value Ref Range   Alcohol, Ethyl (B) <11 0 - 11 mg/dL    Comment:        LOWEST DETECTABLE LIMIT FOR SERUM ALCOHOL IS 11 mg/dL FOR MEDICAL PURPOSES ONLY  Urine Drug Screen     Status: Abnormal   Collection Time: 10/15/14 11:31 PM  Result  Value Ref Range   Opiates NONE DETECTED NONE DETECTED   Cocaine NONE DETECTED NONE DETECTED   Benzodiazepines POSITIVE (A) NONE DETECTED   Amphetamines NONE DETECTED NONE DETECTED   Tetrahydrocannabinol NONE DETECTED NONE DETECTED   Barbiturates POSITIVE (A) NONE DETECTED    Comment:        DRUG SCREEN FOR MEDICAL PURPOSES ONLY.  IF CONFIRMATION IS NEEDED FOR ANY PURPOSE, NOTIFY LAB WITHIN 5 DAYS.        LOWEST DETECTABLE LIMITS FOR URINE DRUG SCREEN Drug Class       Cutoff (ng/mL) Amphetamine      1000 Barbiturate      200 Benzodiazepine   660 Tricyclics       630 Opiates          300 Cocaine          300 THC              50  TSH     Status: None   Collection Time: 10/16/14  5:28 AM  Result Value Ref Range   TSH 2.870 0.350 - 4.500 uIU/mL    Comment: Performed at [facility]      Constitutional:  BP 130/97 mmHg  Pulse 109  Ht 5' 4" (1.626 m)  Wt 156 lb (70.761 kg)  BMI 26.76 kg/m2   Mental Status Examination;  Patient is casually dressed and fairly groomed.  Her speech is slow, clear and coherent.  Her thought process slow and logical.  She described her mood depressed and her affect is constricted.  She endorse suicidal thoughts and cannot contract for safety.  She denies any hallucination or any paranoia.  Her psychomotor activity is slow.  Her fund of knowledge is below average.  She has difficulty remembering things.  She has no paranoia or any delusions.  She has no tremors or shakes.  She is alert and oriented 3.  Her insight judgment is poor.  Her impulse control is okay.  Established Problem, Stable/Improving (1), Review of Psycho-Social Stressors (1), Review and summation of old records (2), New Problem, with no additional work-up planned (3), Review of Last Therapy Session (1), Review of Medication Regimen & Side Effects (2) and Review of New Medication or Change in Dosage (2)  Assessment: Axis I: Maj. depressive disorder, recurrent severe,  posttraumatic stress disorder, cognitive disorder NOS  Axis II: Deferred  Axis III:  Past Medical History  Diagnosis Date  . High cholesterol   . Depression   . Bipolar 1 disorder   . Anxiety   . GERD (gastroesophageal reflux disease)   . Insomnia     Axis IV: Moderate   Plan:  Patient meets criteria for inpatient psychiatric services.  She has severe depression with anhedonia, feeling hopelessness and worthlessness and she cannot contract for safety.  She has suicidal thoughts.  Patient requires inpatient psychiatric services and I discussed the plan with her and her Simmons which agreed.  Patient will escort  to the emergency room by her Simmons.  I have informed emergency room staff and inpatient unit.  There is a bed availability however patient requires medical clearance. Patient will start Zyprexa 10 mg at bedtime.  Discontinue Seroquel since it is not working. Continue all other medication. Patient will be follow-up with inpatient psychiatric services.    , T., MD 11/02/2014

## 2014-11-02 NOTE — ED Notes (Signed)
Unable to sign EMTALA  equipt failure

## 2014-11-02 NOTE — ED Provider Notes (Signed)
CSN: [id number]     Arrival date & time 11/02/14  1616 History   First MD Initiated Contact with Patient 11/02/14 1817     Chief Complaint  Patient presents with  . med clearance   . depression      (Consider location/radiation/quality/duration/timing/severity/associated sxs/prior Treatment) Patient is a 63 y.o. female presenting with mental health disorder.  Mental Health Problem Presenting symptoms: depression and suicidal thoughts   Presenting symptoms: no agitation   Presenting symptoms comment:  Insomia Degree of incapacity (severity):  Severe Onset quality:  Gradual Duration:  1 week Timing:  Constant Progression:  Worsening Chronicity:  Recurrent Treatment compliance:  All of the time Relieved by:  Nothing Worsened by:  Nothing tried Ineffective treatments:  Anti-anxiety medications and antidepressants Associated symptoms: anhedonia, fatigue and insomnia   Associated symptoms: no abdominal pain, no appetite change, no chest pain and no headaches   Risk factors: hx of mental illness     Past Medical History  Diagnosis Date  . High cholesterol   . Depression   . Anxiety   . GERD (gastroesophageal reflux disease)   . Insomnia   . Arthritis    Past Surgical History  Procedure Laterality Date  . Cosmetic surgery    . Cesarean section    . Laproscopy     Family History  Problem Relation Age of Onset  . Sleep apnea Father   . Alcohol abuse Father   . Diabetes Mother   . Diabetes Sister   . Diabetes Maternal Uncle   . Diabetes Cousin    History  Substance Use Topics  . Smoking status: Never Smoker   . Smokeless tobacco: Never Used  . Alcohol Use: No   OB History    No data available     Review of Systems  Constitutional: Positive for fatigue. Negative for fever, chills, diaphoresis, activity change and appetite change.  HENT: Negative for congestion, facial swelling, rhinorrhea and sore throat.   Eyes: Negative for photophobia and discharge.   Respiratory: Negative for cough, chest tightness and shortness of breath.   Cardiovascular: Negative for chest pain, palpitations and leg swelling.  Gastrointestinal: Negative for nausea, vomiting, abdominal pain and diarrhea.  Endocrine: Negative for polydipsia and polyuria.  Genitourinary: Negative for dysuria, frequency, difficulty urinating and pelvic pain.  Musculoskeletal: Negative for back pain, arthralgias, neck pain and neck stiffness.  Skin: Negative for color change and wound.  Allergic/Immunologic: Negative for immunocompromised state.  Neurological: Negative for facial asymmetry, weakness, numbness and headaches.  Hematological: Does not bruise/bleed easily.  Psychiatric/Behavioral: Positive for suicidal ideas. Negative for confusion and agitation. The patient has insomnia.       Allergies  Review of patient's allergies indicates no known allergies.  Home Medications   Prior to Admission medications   Medication Sig Start Date End Date Taking? Authorizing Provider  clonazePAM (KLONOPIN) 0.5 MG tablet Take 0.5 mg by mouth 3 (three) times daily.    Yes Historical Provider, MD  Cyanocobalamin (VITAMIN B-12 IJ) Inject as directed once a week. On Saturday   Yes Historical Provider, MD  mirtazapine (REMERON) 30 MG tablet Take 30 mg by mouth at bedtime.   Yes Historical Provider, MD  omeprazole (PRILOSEC) 20 MG capsule Take 1 capsule (20 mg total) by mouth daily. For acid reflux 07/26/14  Yes Encarnacion Slates, NP  pantoprazole (PROTONIX) 20 MG tablet Take 1 tablet (20 mg total) by mouth daily. 10/13/14  Yes Jennifer L Piepenbrink, PA-C  POTASSIUM PO Take 1  tablet by mouth once a week.    Yes Historical Provider, MD  pravastatin (PRAVACHOL) 20 MG tablet Take 1 tablet (20 mg total) by mouth at bedtime. For high cholesterol 07/26/14  Yes Encarnacion Slates, NP  QUEtiapine (SEROQUEL) 100 MG tablet Take 300 mg by mouth at bedtime. Take 1 to 2 tablets at bedtime.   Yes Historical Provider, MD   sertraline (ZOLOFT) 100 MG tablet Take 100 mg by mouth daily.   Yes Historical Provider, MD  tamsulosin (FLOMAX) 0.4 MG CAPS capsule Take 1 capsule (0.4 mg total) by mouth daily. For urinary retension 07/26/14  Yes Encarnacion Slates, NP  Vitamin D, Ergocalciferol, (DRISDOL) 50000 UNITS CAPS capsule Take 1 capsule (50,000 Units total) by mouth every 7 (seven) days. On Wednesday: For bone health 07/26/14  Yes Encarnacion Slates, NP   BP 115/69 mmHg  Pulse 83  Temp(Src) 98 F (36.7 C) (Oral)  Resp 16  SpO2 96% Physical Exam  Constitutional: She is oriented to person, place, and time. She appears well-developed and well-nourished. No distress.  HENT:  Head: Normocephalic and atraumatic.  Mouth/Throat: No oropharyngeal exudate.  Eyes: Pupils are equal, round, and reactive to light.  Neck: Normal range of motion. Neck supple.  Cardiovascular: Normal rate, regular rhythm and normal heart sounds.  Exam reveals no gallop and no friction rub.   No murmur heard. Pulmonary/Chest: Effort normal and breath sounds normal. No respiratory distress. She has no wheezes. She has no rales.  Abdominal: Soft. Bowel sounds are normal. She exhibits no distension and no mass. There is no tenderness. There is no rebound and no guarding.  Musculoskeletal: Normal range of motion. She exhibits no edema or tenderness.  Neurological: She is alert and oriented to person, place, and time.  Skin: Skin is warm and dry.  Psychiatric: She has a normal mood and affect. Thought content is not paranoid and not delusional. She expresses suicidal ideation. She expresses no suicidal plans and no homicidal plans.    ED Course  Procedures (including critical care time) Labs Review Labs Reviewed  CBC - Abnormal; Notable for the following:    RBC 5.25 (*)    Hemoglobin 15.7 (*)    HCT 46.5 (*)    All other components within normal limits  COMPREHENSIVE METABOLIC PANEL - Abnormal; Notable for the following:    Sodium 148 (*)     Potassium 3.4 (*)    Glucose, Bld 163 (*)    Calcium 10.6 (*)    GFR calc non Af Amer 76 (*)    GFR calc Af Amer 88 (*)    Anion gap 23 (*)    All other components within normal limits  SALICYLATE LEVEL - Abnormal; Notable for the following:    Salicylate Lvl <3.7 (*)    All other components within normal limits  URINE RAPID DRUG SCREEN (HOSP PERFORMED) - Abnormal; Notable for the following:    Benzodiazepines POSITIVE (*)    Barbiturates POSITIVE (*)    All other components within normal limits  ACETAMINOPHEN LEVEL  ETHANOL    Imaging Review No results found.   EKG Interpretation   Date/Time:  Tuesday November 02 2014 21:10:33 EST Ventricular Rate:  83 PR Interval:  136 QRS Duration: 72 QT Interval:  376 QTC Calculation: 441 R Axis:   45 Text Interpretation:  Sinus rhythm with occasional Premature ventricular  complexes Otherwise normal ECG Confirmed by DOCHERTY  MD, MEGAN ([id number]) on  11/03/2014 12:32:47 AM  MDM   Final diagnoses:  Suicidal thoughts  Insomnia    Pt is a 63 y.o. female with Pmhx as above who presents with insomnia, depression and passive suicidal ideation. Patient states that this is a recurrent issue and she feels like she cannot go on. She states symptoms worsen the past 2-3 days. She says she saw Dr. Adele Schilder in the office today who told her to come to [facility] long to be medically cleared for admission to [place]. She denies HI or AV hallucinations. She states she has also had to 3 days of intermittent palpitations lasting secs at a time without CP, SOB, n/v, diaphoresis or fevers.  EKG sinus PVCs. Patient medically clear and can go to behavioral health.        Ernestina Patches, MD 11/03/14 [phone]

## 2014-11-02 NOTE — Consult Note (Signed)
  Spoke to Dr. Adele Schilder regarding patient states that he is sending patient to [facility] for medical clearance and to wait on bed that will be opening up at [facility] adult unit after 7:00 PM  Confirmed with Alfonzo Beers, RN [facility]) that patient has been accepted to [facility] and he will call when bed is available.    Shuvon B. Rankin FNP-BC  I have personally seen the patient and agreed with the findings and involved in the treatment plan. Berniece Andreas, MD

## 2014-11-02 NOTE — Progress Notes (Signed)
CSW called [facility] to inquire about bed for Pt.   CSW was informed that beds should be opening tonight and [facility] will call ED back when the bed is open and they are ready for the Pt. To admit.  Willette Brace [phone] ED CSW 11/02/2014 6:59 PM

## 2014-11-02 NOTE — ED Provider Notes (Signed)
Accepted to [facility] by Dr. Sabra Heck.   Pamella Pert, MD 11/02/14 2007

## 2014-11-02 NOTE — ED Notes (Signed)
Pt has been seen and treated recently for depression and insomnia, reports depression and insomnia have increased, pt has been at [facility] already but needs to med clearance. Pt reports she has had thought of "not wanting to live this way" for the past few days.

## 2014-11-02 NOTE — BH Assessment (Signed)
Triage Specialist completed assessment and pt meets criteria for inpatient placement. Accepted to [facility] by Dr. Sabra Heck as soon as medical clearance asap. Pt is voluntary. She will be in [address], bed 2, on adult unit.   Ramond Dial, [facility] Triage Specialist

## 2014-11-03 ENCOUNTER — Encounter (HOSPITAL_COMMUNITY): Payer: Self-pay | Admitting: *Deleted

## 2014-11-03 DIAGNOSIS — G4700 Insomnia, unspecified: Secondary | ICD-10-CM

## 2014-11-03 MED ORDER — CLONAZEPAM 1 MG PO TABS
1.0000 mg | ORAL_TABLET | Freq: Every day | ORAL | Status: DC
  Administered 2014-11-04 – 2014-11-13 (×10): 1 mg via ORAL
  Filled 2014-11-03 (×10): qty 1

## 2014-11-03 MED ORDER — MIRTAZAPINE 15 MG PO TBDP
45.0000 mg | ORAL_TABLET | Freq: Every day | ORAL | Status: DC
  Administered 2014-11-03 – 2014-11-14 (×12): 45 mg via ORAL
  Filled 2014-11-03 (×10): qty 3
  Filled 2014-11-03: qty 12
  Filled 2014-11-03 (×4): qty 3

## 2014-11-03 MED ORDER — ENSURE COMPLETE PO LIQD
237.0000 mL | Freq: Three times a day (TID) | ORAL | Status: DC
  Administered 2014-11-05 – 2014-11-13 (×3): 237 mL via ORAL

## 2014-11-03 MED ORDER — SERTRALINE HCL 50 MG PO TABS
150.0000 mg | ORAL_TABLET | Freq: Every day | ORAL | Status: DC
  Administered 2014-11-04 – 2014-11-08 (×5): 150 mg via ORAL
  Filled 2014-11-03 (×7): qty 3

## 2014-11-03 NOTE — Clinical Social Work Note (Signed)
CSW spoke with patient's sister, [name],, [phone].  She advised she is willing to allow patient to return to her home for a few days after discharge but not to stay with her indefinitely.  She asked about the options of an assisted living.  Sister was informed that due to patient not having or being eligible for Medicaid (sister reports patient's income at around $3,000 per month) she would have to pay out of pocket.  Sister stated they will look into having someone stay with patient in her home at discharge.

## 2014-11-03 NOTE — Progress Notes (Signed)
NUTRITION ASSESSMENT  Pt identified as at risk on the Malnutrition Screen Tool  INTERVENTION: 1. Educated patient on the importance of nutrition and encouraged intake of food and beverages. 2. Discussed weight goals. 3. Supplements: Ensure Complete po TID, each supplement provides 350 kcal and 13 grams of protein   NUTRITION DIAGNOSIS: Unintentional weight loss related to sub-optimal intake as evidenced by pt report.   Goal: Pt to meet >/= 90% of their estimated nutrition needs.  Monitor:  PO intake  Assessment:  63 yo female admitted for depression and SI.  Pt reports 50 lb weight loss since July, UBW of 202 lb (24% wt loss x 4 months, this is significant for time frame). Pt with noticeable signs of muscle and fat depletion in face and arms.   Pt has not been eating well and reports no appetite.  Discussed with pt the importance of eating 3 meals a day with snacks, emphasizing protein consumption. Discussed the importance of good nutrition for mental health and aiding in depression and anxiety.  Pt is interested in receiving Ensure supplements. RD to order TID.  Pt meets criteria for severe MALNUTRITION in the context of chronic illness as evidenced by 24% wt loss x 4 months and energy intake <75% for over 1 month.  Height: Ht Readings from Last 1 Encounters:  11/02/14 5\' 4"  (1.626 m)    Weight: Wt Readings from Last 1 Encounters:  11/02/14 154 lb (69.854 kg)    Weight Hx: Wt Readings from Last 10 Encounters:  11/02/14 154 lb (69.854 kg)  11/02/14 156 lb (70.761 kg)  10/15/14 170 lb (77.111 kg)  10/13/14 170 lb (77.111 kg)  10/05/14 172 lb (78.019 kg)  09/07/14 172 lb (78.019 kg)  08/27/14 178 lb (80.74 kg)  08/11/14 187 lb (84.823 kg)  07/11/14 179 lb (81.194 kg)  01/04/14 200 lb (90.719 kg)    BMI:  Body mass index is 26.42 kg/(m^2). Pt meets criteria for overweight based on current BMI.  Estimated Nutritional Needs: Kcal: 25-30 kcal/kg Protein: > 1 gram  protein/kg Fluid: 1 ml/kcal  Diet Order: Diet regular Pt is also offered choice of unit snacks mid-morning and mid-afternoon.  Pt is eating as desired.   Lab results and medications reviewed.   Clayton Bibles, MS, RD, LDN Pager: [phone] After Hours Pager: [phone]

## 2014-11-03 NOTE — Progress Notes (Signed)
Pt was pleasant and cooperative during the adm process. Report stated pt was referred by [facility] for suicidal remarks and med clearance. However, pt denies going to [place], stating that her sisters "brought her to the hospital".  When asked the circumstances surrounding her adm pt stated, "insomnia, chronic insomnia".  After more discussion pt stated, "told them I thought about it and I don't want to feel this way anymore, but I don't have any plans". Pt is a pt of Dr Marguerite Olea outpatient. During the adm process pt asked if she would be getting any meds for sleep. Writer informed pt of all scheduled meds for the night. Pt was satisfied.
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
## 2014-11-03 NOTE — Plan of Care (Signed)
Problem: [facility] Concurrent Medical Problem Goal: STG-Compliance with medication and/or treatment as ordered (STG-Compliance with medication and/or treatment as ordered by MD) Outcome: Progressing Pt accepting prescribed medication after prompting
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
## 2014-11-03 NOTE — [facility] Suicide Risk Assessment (Signed)
Kendra Simmons INPATIENT:  Family/Significant Other Suicide Prevention Education  Suicide Prevention Education:  Education Complete; Kendra Simmons, Sister, [phone]; has been identified by the patient as the family member/significant other with whom the patient will be residing, and identified as the person(s) who will aid the patient in the event of a mental health crisis (suicidal ideations/suicide attempt).  With written consent from the patient, the family member/significant other has been provided the following suicide prevention education, prior to the and/or following the discharge of the patient.  The suicide prevention education provided includes the following:  Suicide risk factors  Suicide prevention and interventions  National Suicide Hotline telephone number  [facility] assessment telephone number  [facility] Emergency Assistance [place] and/or Residential Mobile Crisis Unit telephone number  Request made of family/significant other to:  Remove weapons (e.g., guns, rifles, knives), all items previously/currently identified as safety concern.  Sister advised patient does not have access to weapons.    Remove drugs/medications (over-the-counter, prescriptions, illicit drugs), all items previously/currently identified as a safety concern.  The family member/significant other verbalizes understanding of the suicide prevention education information provided.  The family member/significant other agrees to remove the items of safety concern listed above.  Kendra Simmons 11/03/2014, 11:14 AM

## 2014-11-03 NOTE — [facility] Suicide Risk Assessment (Signed)
   Nursing information obtained from:  Patient Demographic factors:  Caucasian, Divorced or widowed, Living alone (retired) Current Mental Status:  NA Loss Factors:  Legal issues, Decline in physical health Historical Factors:  Family history of mental illness or substance abuse Risk Reduction Factors:  Sense of responsibility to family, Religious beliefs about death, Positive social support Total Time spent with patient: 45 minutes  CLINICAL FACTORS:  Depression, Severe, Insomnia  Psychiatric Specialty Exam: Physical Exam  ROS  Blood pressure 94/74, pulse 112, temperature 98.1 F (36.7 C), temperature source Oral, resp. rate 18, height 5\' 4"  (1.626 m), weight 69.854 kg (154 lb).Body mass index is 26.42 kg/(m^2).  SEE ADMIT NOTE MSE   COGNITIVE FEATURES THAT CONTRIBUTE TO RISK:  Closed-mindedness    SUICIDE RISK:   Moderate:  Frequent suicidal ideation with limited intensity, and duration, some specificity in terms of plans, no associated intent, good self-control, limited dysphoria/symptomatology, some risk factors present, and identifiable protective factors, including available and accessible social support.  PLAN OF CARE: Patient will be admitted to inpatient psychiatric unit for stabilization and safety. Will provide and encourage milieu participation. Provide medication management and maked adjustments as needed.  Will follow daily.    I certify that inpatient services furnished can reasonably be expected to improve the patient's condition.  Kendra Simmons 11/03/2014, 3:39 PM

## 2014-11-03 NOTE — Progress Notes (Signed)
Patient ID: Kendra Simmons, female   DOB: 1951-07-05, 63 y.o.   MRN: [id number]  PER STATE REGULATIONS 482.30  THIS CHART WAS REVIEWED FOR MEDICAL NECESSITY WITH RESPECT TO THE PATIENT'S ADMISSION/DURATION OF STAY.  NEXT REVIEW DATE: 11/06/14  Roma Schanz, RN, BSN CASE MANAGER

## 2014-11-03 NOTE — [facility] Group Notes (Signed)
[place] LCSW Group Therapy  Emotional Regulation 1:15 - 2: 30 PM        11/03/2014     Type of Therapy:  Group Therapy  Participation Level:  Appropriate  Participation Quality:  Appropriate  Affect:  Flat, Depressed  Cognitive:  Attentive Appropriate  Insight:  Developing/Improving   Engagement in Therapy:  Developing/Improving Modes of Intervention:  Discussion Exploration Problem-Solving Supportive  Summary of Progress/Problems:  Group topic was emotional regulations.  Patient participated in the discussion and was able to identify an emotion that needed to regulated.  Patient shared the emotion she has to handled better is feelings of being overwhelmed. Patient states she is concerned she is not going to get better.  Patient states she wants her life back.  She talked about how she used to go to the movies and spend time with friends.  [name] 11/03/2014

## 2014-11-03 NOTE — Progress Notes (Signed)
D: Patient denies SI/HI and A/V hallucinations; patient having complaints about lack of sleep  A: Monitored q 15 minutes; patient encouraged to attend groups; patient educated about medications; patient given medications per physician orders; patient encouraged to express feelings and/or concerns  R: Patient forwards little information; patient is cooperative but cautious; patient can be suspicious of the medications she is receiving; patient is flat and depressed; patient's interaction with staff and peers is very minimal; patient was able to set goal to talk with staff 1:1 when having feelings of SI; patient is taking medications as prescribed and tolerating medications; patient is attending some groups

## 2014-11-04 DIAGNOSIS — F329 Major depressive disorder, single episode, unspecified: Secondary | ICD-10-CM

## 2014-11-04 LAB — LIPID PANEL
CHOLESTEROL: 177 mg/dL (ref 0–200)
HDL: 45 mg/dL (ref >39)
LDL Cholesterol: 116 mg/dL — ABNORMAL HIGH (ref 0–99)
TRIGLYCERIDES: 79 mg/dL (ref <150)
Total CHOL/HDL Ratio: 3.9 RATIO
VLDL: 16 mg/dL (ref 0–40)

## 2014-11-04 LAB — HEMOGLOBIN A1C
HEMOGLOBIN A1C: 5.8 % — AB (ref <5.7)
MEAN PLASMA GLUCOSE: 120 mg/dL — AB (ref <117)

## 2014-11-04 MED ORDER — OLANZAPINE 5 MG PO TBDP
15.0000 mg | ORAL_TABLET | Freq: Every day | ORAL | Status: DC
  Administered 2014-11-04 – 2014-11-14 (×11): 15 mg via ORAL
  Filled 2014-11-04 (×13): qty 1

## 2014-11-04 NOTE — Progress Notes (Signed)
D: Pt presents sad in affect and anxious in mood. Pt reports to writer that her eyes were hurting her at a level 10/10. She described the pain as if she had glass in her eyes. Pt verbalized that this pain has occurred in the past from a lack of rest. Pt was given 650 mg of Tylenol. She reports no relief from her previous use of NS to rinse her eyes out. At follow-up pt did report that her eyes were feeling better after the administration of Tylenol. Pt is currently denying any SI/HI/AVH. Pt actively participated within the milieu.  A: Writer administered scheduled and prn medications to pt. Continued support and availability as needed was extended to this pt. Staff continue to monitor pt with q9min checks.  R: No adverse drug reactions noted. Pt receptive to treatment. Pt remains safe at this time.

## 2014-11-04 NOTE — Progress Notes (Signed)
Pt attended NA group this evening.  

## 2014-11-04 NOTE — Progress Notes (Signed)
Recreation Therapy Notes  Animal-Assisted Activity/Therapy (AAA/T) Program Checklist/Progress Notes Patient Eligibility Criteria Checklist & Daily Group note for Rec Tx Intervention  Date: 11.19.2015 Time: 2:45pm Location: [address]    AAA/T Program Assumption of Risk Form signed by Patient/ or Parent Legal Guardian yes  Patient is free of allergies or sever asthma yes  Patient reports no fear of animals yes  Patient reports no history of cruelty to animals yes   Patient understands his/her participation is voluntary yes  Patient washes hands before animal contact yes  Patient washes hands after animal contact yes  Behavioral Response: Appropriate   Education: Hand Washing, Appropriate Animal Interaction   Education Outcome: Acknowledges education.   Clinical Observations/Feedback: Patient engaged in session, petting therapy dog appropriately.   Laureen Ochs Irianna Gilday, LRT/CTRS  Dilraj Killgore L 11/04/2014 4:22 PM

## 2014-11-04 NOTE — BHH Group Notes (Signed)
[place] LCSW Group Therapy  [facility] 1:15 - 2:30 PM  11/04/2014 3:22 PM '  Type of Therapy:  Group Therapy  Participation Level: None  Participation Quality:Minimal  Affect:  Flat, Depressed  Cognitive:  Appropriate  Insight:  Developing/Improving   Engagement in Therapy:  Developing/Improving   Modes of Intervention:  Discussion, Education, Exploration, Problem-Solving, Rapport Building, Support   Summary of Progress/Problems:   Patient attended group but did not participate.  [name] 11/04/2014 3:22 PM

## 2014-11-04 NOTE — Progress Notes (Signed)
[facility] MD Progress Note  11/04/2014 2:17 PM Kendra Simmons  MRN:  [id number] Subjective:   She continues to ruminate about her insomnia and continues to feel " tired", " exhausted", and depressed. Objective: Patient remains depressed, sad, and focused on her insomnia. She states " I think I slept just a tiny bit", but does remember having " dreamt some". As per nursing staff, however, she seemed to sleep several hours. She denies medication side effects, and does state the medications are making her " tired". She denies psychotic symptoms. She is somatically focused, and her deep concerns and intensity of focus on  insomnia  Seems to be related to her somatic preoccupations , and may possibly have some delusional aspects to it, based on discrepancy between observed sleep and report of almost complete insomnia. No overt psychotic symptoms are noted, however. She is going to groups, but interacts little with peers. No disruptive behaviors on unit. Has had no panic attacks or severe anxiety on unit.  Lipid panel result reviewed. Diagnosis:  MDD, consider Panic Disorder      Total Time spent with patient: 25 minutes     ADL's: fair  Sleep: poor - patient states she slept " just a little bit", but staff reports patient seemed to sleep better than she reports.   Appetite:  Poor   Suicidal Ideation:  Some vague passive thoughts of being dead, but denies any plan or intention of hurting self  Homicidal Ideation:  Denies any  AEB (as evidenced by):  Psychiatric Specialty Exam: Physical Exam  Review of Systems  Constitutional: Positive for weight loss. Negative for fever and chills.  Eyes: Positive for pain. Negative for blurred vision, double vision, photophobia, discharge and redness.       Attributes this to poor sleep   Respiratory: Negative for cough and shortness of breath.   Cardiovascular: Negative for chest pain.  Gastrointestinal: Negative for nausea, vomiting, abdominal  pain, diarrhea and constipation.  Genitourinary: Negative for dysuria, urgency and frequency.  Musculoskeletal: Negative for myalgias and neck pain.  Skin: Negative for itching and rash.  Neurological: Positive for headaches. Negative for dizziness, tingling, seizures and loss of consciousness.  Psychiatric/Behavioral: Positive for depression. The patient has insomnia.     Blood pressure 120/82, pulse 91, temperature 98 F (36.7 C), temperature source Oral, resp. rate 16, height 5\' 4"  (1.626 m), weight 69.854 kg (154 lb).Body mass index is 26.42 kg/(m^2).  General Appearance: Fairly Groomed  Engineer, water::  Good  Speech:  Slow  Volume:  Normal  Mood:  Depressed and Dysphoric  Affect:  Constricted  Thought Process:  Goal Directed and Linear  Orientation:  Other:  fully alert and attentive  Thought Content:  no hallucinations, no delusions, ruminative about insomnia  Suicidal Thoughts:  No- At this time denies any suicidal plan or intent and contracts for safety on the unit  Homicidal Thoughts:  No  Memory:  Recent and remote grossly intact.  Judgement:  Fair  Insight:  Fair  Psychomotor Activity:  Decreased  Concentration:  Good  Recall:  Good  Fund of Knowledge:Good  Language: Good  Akathisia:  Negative  Handed:  Right  AIMS (if indicated):     Assets:  Desire for Improvement Resilience  Sleep:      Musculoskeletal: Strength & Muscle Tone: within normal limits Gait & Station: normal Patient leans: N/A  Current Medications: Current Facility-Administered Medications  Medication Dose Route Frequency Provider Last Rate Last Dose  . acetaminophen (TYLENOL)  tablet 650 mg  650 mg Oral Q6H PRN [name], MD   650 mg at 11/03/14 2128  . alum & mag hydroxide-simeth (MAALOX/MYLANTA) 200-200-20 MG/5ML suspension 30 mL  30 mL Oral Q4H PRN [name], MD      . clonazePAM Bobbye Charleston) tablet 1 mg  1 mg Oral QHS Fernando A Cobos, MD      . feeding supplement (ENSURE COMPLETE)  (ENSURE COMPLETE) liquid 237 mL  237 mL Oral TID BM Clayton Bibles, RD   237 mL at 11/03/14 1400  . magnesium hydroxide (MILK OF MAGNESIA) suspension 30 mL  30 mL Oral Daily PRN [name], MD      . mirtazapine (REMERON SOL-TAB) disintegrating tablet 45 mg  45 mg Oral QHS Jenne Campus, MD   45 mg at 11/03/14 2322  . OLANZapine zydis (ZYPREXA) disintegrating tablet 10 mg  10 mg Oral QHS [name], MD   10 mg at 11/03/14 2320  . pantoprazole (PROTONIX) EC tablet 80 mg  80 mg Oral Daily [name], MD   80 mg at 11/04/14 0853  . pravastatin (PRAVACHOL) tablet 20 mg  20 mg Oral q1800 [name], MD   20 mg at 11/03/14 1713  . sertraline (ZOLOFT) tablet 150 mg  150 mg Oral Daily Jenne Campus, MD   150 mg at 11/04/14 0849  . tamsulosin (FLOMAX) capsule 0.4 mg  0.4 mg Oral Daily [name], MD   0.4 mg at 11/04/14 6160    Lab Results:  Results for orders placed or performed during the hospital encounter of 11/02/14 (from the past 48 hour(s))  Lipid panel     Status: Abnormal   Collection Time: 11/04/14  6:50 AM  Result Value Ref Range   Cholesterol 177 0 - 200 mg/dL   Triglycerides 79 <150 mg/dL   HDL 45 >39 mg/dL   Total CHOL/HDL Ratio 3.9 RATIO   VLDL 16 0 - 40 mg/dL   LDL Cholesterol 116 (H) 0 - 99 mg/dL    Comment:        Total Cholesterol/HDL:CHD Risk Coronary Heart Disease Risk Table                     Men   Women  1/2 Average Risk   3.4   3.3  Average Risk       5.0   4.4  2 X Average Risk   9.6   7.1  3 X Average Risk  23.4   11.0        Use the calculated Patient Ratio above and the CHD Risk Table to determine the patient's CHD Risk.        ATP III CLASSIFICATION (LDL):  <100     mg/dL   Optimal  100-129  mg/dL   Near or Above                    Optimal  130-159  mg/dL   Borderline  160-189  mg/dL   High  >190     mg/dL   Very High Performed at [facility]     Physical Findings: AIMS: Facial and Oral Movements Muscles of Facial  Expression: None, normal Lips and Perioral Area: None, normal Jaw: None, normal Tongue: None, normal,Extremity Movements Upper (arms, wrists, hands, fingers): None, normal Lower (legs, knees, ankles, toes): None, normal, Trunk Movements Neck, shoulders, hips: None, normal, Overall Severity Severity of abnormal movements (  highest score from questions above): None, normal Incapacitation due to abnormal movements: None, normal Patient's awareness of abnormal movements (rate only patient's report): No Awareness, Dental Status Current problems with teeth and/or dentures?: No Does patient usually wear dentures?: No  CIWA:    COWS:      Assessment: Patient remains significantly depressed and focused on insomnia.  She is not suicidal. Staff reports she seemed to sleep fairly, but for a few hours last night. Patient is tolerating medications well and does feel they are helping her .  Treatment Plan Summary: Daily contact with patient to assess and evaluate symptoms and progress in treatment Medication management See below  Plan: Continue inpatient treatment and support  Increase Zyprexa to 15 mgrs QHS Continue Zoloft at 150 mgrs QDAY  Continue Remeron 45 mgrs QHS Continue Klonopin 1 mgr QHS   Medical Decision Making Problem Points:  Established problem, stable/improving (1), Review of last therapy session (1) and Review of psycho-social stressors (1) Data Points:  Review of medication regiment & side effects (2) Review of new medications or change in dosage (2)  I certify that inpatient services furnished can reasonably be expected to improve the patient's condition.   COBOS, Blessing 11/04/2014, 2:17 PM

## 2014-11-04 NOTE — Progress Notes (Signed)
D: Patient denies SI/HI and A/V hallucinations; patient complained that she did not sleep but per report from the prior shift that she did not ask for anything and the per the 15 minute check sheet the patient was sleeping  A: Monitored q 15 minutes; patient encouraged to attend groups; patient educated about medications; patient given medications per physician orders; patient encouraged to express feelings and/or concerns  R: Patient was irritable this morning and reported that she did not sleep; patient's interaction with staff and peers is minimal; patient was able to set goal to talk with staff 1:1 when having feelings of SI; patient is taking medications as prescribed and tolerating medications
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
## 2014-11-05 NOTE — Progress Notes (Signed)
D: Pt was blunted in affect and depressed in mood. Pt glanced down at the floor and shrugged her shoulders when asked about her current mood. Pt did not attend karaoke this evening. Pt continues to report that her eyes are hurting. Pt refused any tylenol. She insists that she just needs to rest. Pt was encouraged to notify staff if she was having any  difficulty going or staying asleep. Pt is negative for any SI/HI/AVH.  A: Writer administered scheduled medications to pt, per MD orders. Continued support and availability as needed was extended to this pt. Staff continue to monitor pt with q74min checks.  R: No adverse drug reactions noted. Pt receptive to treatment. Pt remains safe at this time.

## 2014-11-05 NOTE — BHH Group Notes (Signed)
[facility] LCSW Aftercare Discharge Planning Group Note   11/05/2014 8:46 AM    Participation Quality:  Appropraite  Mood/Affect:  Appropriate  Depression Rating:  1  Anxiety Rating:  1  Thoughts of Suicide:  No  Will you contract for safety?   NA  Current AVH:  No  Plan for Discharge/Comments:  Patient attended discharge planning group and actively participated in group.  She reports sleeping a little better last night.  She will follow up with Dr. Adele Schilder.  Suicide prevention education reviewed and SPE document provided.   Transportation Means: Patient has transportation.   Supports:  Patient has a support system.   Kama Cammarano, Eulas Post

## 2014-11-05 NOTE — BHH Group Notes (Signed)
[place] Group Notes:  activities Date:  11/05/2014  Time:  11:01 AM  Type of Therapy:  Psychoeducational Skills  Participation Level:  Active  Participation Quality:  Appropriate  Affect:  Appropriate  Cognitive:  Appropriate  Insight:  Appropriate  Engagement in Group:  Engaged  Modes of Intervention:  Discussion  Summary of Progress/Problems:  Kendra Simmons 11/05/2014, 11:01 AM

## 2014-11-05 NOTE — Progress Notes (Signed)
D: Patient presents with flat, depressed affect and mood. She declined completing the self inventory sheet today. Writer observed that the patient has been sitting in the dayroom the majority of the day with no interaction with others, just watching television. Patient has attended groups and visible in the milieu. In compliance with all medications and tolerating them well.  A: Support and encouragement provided to patient. Scheduled medications administered per MD orders. Maintain Q15 minute checks for safety.   R: Patient receptive. Denies SI/HI/AVH. Patient remains safe on the hall.

## 2014-11-05 NOTE — BHH Group Notes (Deleted)
[place] LCSW Group Therapy  Feelings Around Relapse 1:15 -2:30        11/05/2014  3:31 PM   Type of Therapy:  Group Therapy  Participation Level:  Appropriate  Participation Quality:  Appropriate  Affect:  Appropriate  Cognitive:  Attentive Appropriate  Insight:  Developing/Improving  Engagement in Therapy: Developing/Improving  Modes of Intervention:  Discussion Exploration Problem-Solving Supportive  Summary of Progress/Problems:  The topic for today was feelings around relapse.   Patient processed feelings toward relapse and was able to relate to peers. He shared relapsing for him would be isolation.   Patient identified coping skills that can be used to prevent a relapse including spending time with wife and taking medications as prescribed.  He shared he is looking forward to regaining his strength and hopes the [place] will help with that problem.   [name] 11/05/2014 3:31 PM

## 2014-11-05 NOTE — BHH Group Notes (Signed)
Adult Psychoeducational Group Note  Date:  11/05/2014 Time:  10:00 PM  Group Topic/Focus:  AA Meeting  Participation Level:  None  Participation Quality:  Attentive  Affect:  Flat  Cognitive:  Alert  Insight: None  Engagement in Group:  None  Modes of Intervention:  Discussion and Education  Additional Comments:  Kendra Simmons was attentive and alert during group.  Kendra Simmons A 11/05/2014, 10:00 PM

## 2014-11-05 NOTE — Progress Notes (Signed)
Patient ID: Kendra Simmons, female   DOB: 1951-06-27, 63 y.o.   MRN: [id number] [facility] MD Progress Note  11/05/2014 1:39 PM [name]  MRN:  [id number] Subjective:   Patient continues to state she feels depressed and sad. She admits, however, that she is a little better today. She states " I think I must have slept a little" but still complains of insomnia. At this time she complains of bilateral eye pain- however, upon further questioning she states it is not actually the eyes that hurt , but her eyelids, which tend to have a burning sensation due to lack of sleep, as per her report. Objective: I have discussed case with treatment team and have met with patient. Patient presents with some degree of improvement compared to admission. She is less severely depressed, and although still constricted in affect, she is smiling more and is somewhat more interactive and better related/engaged. Thus far, she denies medication side effects. No akathisia or abnormal involuntary movements noted. Intensity of somatic preoccupations also seems decreased . Going to some groups and visible in milieu, although tends to keep to self and interact with peers on a limited basis only. No disruptive behaviors on unit. HgbA1C 5.8 - we reviewed this result and interpretation with patient. * Of note, patient has complained of bilateral eye pain, but states it is actually eye lid pain rather than the actual eyes hurting. There is no conjunctivitis or erythema, no tearfulness,  denies blurry vision, denies visual field changes.   Diagnosis:  MDD, consider Panic Disorder      Total Time spent with patient: 25 minutes     ADL's: fair  Sleep: patient describes as poor but slightly improved-nursing staff report patient slept 6(+) hours  Appetite: fair   Suicidal Ideation:  Currently denies any suicidal ideations Homicidal Ideation:  Denies any  AEB (as evidenced by):  Psychiatric Specialty  Exam: Physical Exam  Review of Systems  Constitutional: Positive for weight loss. Negative for fever and chills.  Eyes: Positive for pain. Negative for blurred vision, double vision, photophobia, discharge and redness.       Attributes this to poor sleep   Respiratory: Negative for cough and shortness of breath.   Cardiovascular: Negative for chest pain.  Gastrointestinal: Negative for nausea, vomiting, abdominal pain, diarrhea and constipation.  Genitourinary: Negative for dysuria, urgency and frequency.  Musculoskeletal: Negative for myalgias and neck pain.  Skin: Negative for itching and rash.  Neurological: Positive for headaches. Negative for dizziness, tingling, seizures and loss of consciousness.  Psychiatric/Behavioral: Positive for depression. The patient has insomnia.     Blood pressure 114/83, pulse 91, temperature 98.1 F (36.7 C), temperature source Oral, resp. rate 16, height '5\' 4"'  (1.626 m), weight 69.854 kg (154 lb).Body mass index is 26.42 kg/(m^2).  General Appearance: Fairly Groomed  Engineer, water::  Good  Speech:  Slow  Volume:  Normal  Mood:  Depressed and but somewhat improved in mood and range of affect today, compared to admission status  Affect:  Constricted- more reactive   Thought Process:  Goal Directed and Linear  Orientation:  Other:  fully alert and attentive  Thought Content:  no hallucinations, no delusions, ruminative about insomnia  Suicidal Thoughts:  No- At this time denies any suicidal plan or intent and contracts for safety on the unit  Homicidal Thoughts:  No  Memory:  Recent and remote grossly intact.  Judgement:  Fair  Insight:  Fair  Psychomotor Activity:  slowly improving  Concentration:  Good  Recall:  Good  Fund of Knowledge:Good  Language: Good  Akathisia:  Negative  Handed:  Right  AIMS (if indicated):     Assets:  Desire for Improvement Resilience  Sleep:  Number of Hours: 6.75   Musculoskeletal: Strength & Muscle Tone:  within normal limits Gait & Station: normal Patient leans: N/A  Current Medications: Current Facility-Administered Medications  Medication Dose Route Frequency Provider Last Rate Last Dose  . acetaminophen (TYLENOL) tablet 650 mg  650 mg Oral Q6H PRN [name], MD   650 mg at 11/04/14 1653  . alum & mag hydroxide-simeth (MAALOX/MYLANTA) 200-200-20 MG/5ML suspension 30 mL  30 mL Oral Q4H PRN [name], MD      . clonazePAM Bobbye Charleston) tablet 1 mg  1 mg Oral QHS Jenne Campus, MD   1 mg at 11/04/14 2201  . feeding supplement (ENSURE COMPLETE) (ENSURE COMPLETE) liquid 237 mL  237 mL Oral TID BM Clayton Bibles, RD   237 mL at 11/05/14 1101  . magnesium hydroxide (MILK OF MAGNESIA) suspension 30 mL  30 mL Oral Daily PRN [name], MD      . mirtazapine (REMERON SOL-TAB) disintegrating tablet 45 mg  45 mg Oral QHS Jenne Campus, MD   45 mg at 11/04/14 2202  . OLANZapine zydis (ZYPREXA) disintegrating tablet 15 mg  15 mg Oral QHS Jenne Campus, MD   15 mg at 11/04/14 2202  . pantoprazole (PROTONIX) EC tablet 80 mg  80 mg Oral Daily [name], MD   80 mg at 11/05/14 0858  . pravastatin (PRAVACHOL) tablet 20 mg  20 mg Oral q1800 [name], MD   20 mg at 11/04/14 1748  . sertraline (ZOLOFT) tablet 150 mg  150 mg Oral Daily Jenne Campus, MD   150 mg at 11/05/14 0857  . tamsulosin (FLOMAX) capsule 0.4 mg  0.4 mg Oral Daily [name], MD   0.4 mg at 11/05/14 6967    Lab Results:  Results for orders placed or performed during the hospital encounter of 11/02/14 (from the past 48 hour(s))  Hemoglobin A1c     Status: Abnormal   Collection Time: 11/04/14  6:50 AM  Result Value Ref Range   Hgb A1c MFr Bld 5.8 (H) <5.7 %    Comment: (NOTE)                                                                       According to the ADA Clinical Practice Recommendations for 2011, when HbA1c is used as a screening test:  >=6.5%   Diagnostic of Diabetes Mellitus           (if  abnormal result is confirmed) 5.7-6.4%   Increased risk of developing Diabetes Mellitus References:Diagnosis and Classification of Diabetes Mellitus,Diabetes ELFY,1017,51(WCHEN 1):S62-S69 and Standards of Medical Care in         Diabetes - 2011,Diabetes [id number] (Suppl 1):S11-S61.    Mean Plasma Glucose 120 (H) <117 mg/dL    Comment: Performed at [facility]  Lipid panel     Status: Abnormal   Collection Time: 11/04/14  6:50 AM  Result Value Ref Range   Cholesterol 177 0 - 200  mg/dL   Triglycerides 79 <150 mg/dL   HDL 45 >39 mg/dL   Total CHOL/HDL Ratio 3.9 RATIO   VLDL 16 0 - 40 mg/dL   LDL Cholesterol 116 (H) 0 - 99 mg/dL    Comment:        Total Cholesterol/HDL:CHD Risk Coronary Heart Disease Risk Table                     Men   Women  1/2 Average Risk   3.4   3.3  Average Risk       5.0   4.4  2 X Average Risk   9.6   7.1  3 X Average Risk  23.4   11.0        Use the calculated Patient Ratio above and the CHD Risk Table to determine the patient's CHD Risk.        ATP III CLASSIFICATION (LDL):  <100     mg/dL   Optimal  100-129  mg/dL   Near or Above                    Optimal  130-159  mg/dL   Borderline  160-189  mg/dL   High  >190     mg/dL   Very High Performed at [facility]     Physical Findings: AIMS: Facial and Oral Movements Muscles of Facial Expression: None, normal Lips and Perioral Area: None, normal Jaw: None, normal Tongue: None, normal,Extremity Movements Upper (arms, wrists, hands, fingers): None, normal Lower (legs, knees, ankles, toes): None, normal, Trunk Movements Neck, shoulders, hips: None, normal, Overall Severity Severity of abnormal movements (highest score from questions above): None, normal Incapacitation due to abnormal movements: None, normal Patient's awareness of abnormal movements (rate only patient's report): No Awareness, Dental Status Current problems with teeth and/or dentures?: No Does patient usually  wear dentures?: No  CIWA:    COWS:      Assessment: Although still quite depressed, she does seem partially improved compared to her admission status. She is presenting with a slightly improved range of affect, is not suicidal , and although still focused on insomnia, seems less intensely ruminative about this. There is still a discrepancy between  Sleep time  As  observed by staff  And reported by patient.  Treatment Plan Summary: Daily contact with patient to assess and evaluate symptoms and progress in treatment Medication management See below  Plan: Continue inpatient treatment and support  Zyprexa 15 mgrs QHS Zoloft 150 mgrs QDAY  Remeron 45 mgrs QHS Klonopin 1 mgr QHS   Medical Decision Making Problem Points:  Established problem, stable/improving (1), Review of last therapy session (1) and Review of psycho-social stressors (1) Data Points:  Review of medication regiment & side effects (2)  I certify that inpatient services furnished can reasonably be expected to improve the patient's condition.   Tyquasia Pant, [place] 11/05/2014, 1:39 PM

## 2014-11-06 DIAGNOSIS — F332 Major depressive disorder, recurrent severe without psychotic features: Secondary | ICD-10-CM | POA: Insufficient documentation

## 2014-11-06 NOTE — Progress Notes (Signed)
Patient ID: Kendra Simmons, female   DOB: 20-Jun-1951, 63 y.o.   MRN: [id number] [facility] MD Progress Note  11/06/2014 2:51 PM Kendra Simmons  MRN:  [id number] Subjective:     Patient continues to state she feels depressed  5/10 and  Anxiety 0/10. More "discouraged that I get get things worked out" Denies [facility]  She states " I think I must have slept a little because I'm dreaming"  but still complains of insomnia. (Nursing reports her sleeping 6.75 hrs) We discussed the impact of depression on motivation/enery and fatigue but she adamantly  does not connect her symptoms to depression she is hyper focused on insomnia. I suggested she keep a sleep journal and inter-reflect on how her depression has exhibited itself in the past.  Attending  groups No disruptive behaviors on unit.    Diagnosis:  MDD, consider Panic Disorder      Total Time spent with patient: 25 minutes     ADL's: fair  Sleep: patient describes as poor but slightly improved-nursing staff report patient slept 6(+) hours  Appetite: fair   Suicidal Ideation:  Currently denies any suicidal ideations Homicidal Ideation:  Denies any  AEB (as evidenced by):  Psychiatric Specialty Exam: Physical Exam  Review of Systems  Constitutional: Positive for weight loss. Negative for fever and chills.  Eyes: Positive for pain. Negative for blurred vision, double vision, photophobia, discharge and redness.       Attributes this to poor sleep   Respiratory: Negative for cough and shortness of breath.   Cardiovascular: Negative for chest pain.  Gastrointestinal: Negative for nausea, vomiting, abdominal pain, diarrhea and constipation.  Genitourinary: Negative for dysuria, urgency and frequency.  Musculoskeletal: Negative for myalgias and neck pain.  Skin: Negative for itching and rash.  Neurological: Positive for headaches. Negative for dizziness, tingling, seizures and loss of consciousness.  Psychiatric/Behavioral:  Positive for depression. The patient has insomnia.     Blood pressure 92/75, pulse 104, temperature 97.8 F (36.6 C), temperature source Oral, resp. rate 18, height 5\' 4"  (1.626 m), weight 69.854 kg (154 lb).Body mass index is 26.42 kg/(m^2).  General Appearance: Fairly Groomed  Engineer, water::  Good  Speech:  Slow  Volume:  Normal  Mood: depressed flat  Affect:  Constricted-   Thought Process:  Goal Directed and Linear  Orientation:  Other:  fully alert and attentive  Thought Content:  no hallucinations, no delusions, ruminative about insomnia  Suicidal Thoughts:  No- At this time denies any suicidal plan or intent and contracts for safety on the unit  Homicidal Thoughts:  No  Memory:  Recent and remote grossly intact.  Judgement:  Fair  Insight:  Fair  Psychomotor Activity:  slowly improving  Concentration:  Good  Recall:  Good  Fund of Knowledge:Good  Language: Good  Akathisia:  Negative  Handed:  Right  AIMS (if indicated):     Assets:  Desire for Improvement Resilience  Sleep:  Number of Hours: 6.75   Musculoskeletal: Strength & Muscle Tone: within normal limits Gait & Station: normal Patient leans: N/A  Current Medications: Current Facility-Administered Medications  Medication Dose Route Frequency Provider Last Rate Last Dose  . acetaminophen (TYLENOL) tablet 650 mg  650 mg Oral Q6H PRN [name], MD   650 mg at 11/04/14 1653  . alum & mag hydroxide-simeth (MAALOX/MYLANTA) 200-200-20 MG/5ML suspension 30 mL  30 mL Oral Q4H PRN [name], MD      . clonazePAM Bobbye [place]) tablet 1  mg  1 mg Oral QHS Jenne Campus, MD   1 mg at 11/05/14 2307  . feeding supplement (ENSURE COMPLETE) (ENSURE COMPLETE) liquid 237 mL  237 mL Oral TID BM Clayton Bibles, RD   237 mL at 11/05/14 1101  . magnesium hydroxide (MILK OF MAGNESIA) suspension 30 mL  30 mL Oral Daily PRN [name], MD      . mirtazapine (REMERON SOL-TAB) disintegrating tablet 45 mg  45 mg Oral QHS Jenne Campus, MD   45 mg at 11/05/14 2307  . OLANZapine zydis (ZYPREXA) disintegrating tablet 15 mg  15 mg Oral QHS Jenne Campus, MD   15 mg at 11/05/14 2308  . pantoprazole (PROTONIX) EC tablet 80 mg  80 mg Oral Daily [name], MD   80 mg at 11/06/14 0930  . pravastatin (PRAVACHOL) tablet 20 mg  20 mg Oral q1800 [name], MD   20 mg at 11/05/14 1703  . sertraline (ZOLOFT) tablet 150 mg  150 mg Oral Daily Myer Peer Cobos, MD   150 mg at 11/06/14 0930  . tamsulosin (FLOMAX) capsule 0.4 mg  0.4 mg Oral Daily [name], MD   0.4 mg at 11/06/14 0930    Lab Results:  No results found for this or any previous visit (from the past 48 hour(s)).  Physical Findings: AIMS: Facial and Oral Movements Muscles of Facial Expression: None, normal Lips and Perioral Area: None, normal Jaw: None, normal Tongue: None, normal,Extremity Movements Upper (arms, wrists, hands, fingers): None, normal Lower (legs, knees, ankles, toes): None, normal, Trunk Movements Neck, shoulders, hips: None, normal, Overall Severity Severity of abnormal movements (highest score from questions above): None, normal Incapacitation due to abnormal movements: None, normal Patient's awareness of abnormal movements (rate only patient's report): No Awareness, Dental Status Current problems with teeth and/or dentures?: No Does patient usually wear dentures?: No  CIWA:    COWS:      Assessment: Although still quite depressed, she does seem partially improved compared to her admission status. She is presenting with a slightly improved range of affect, is not suicidal , and although still focused on insomnia, seems less intensely ruminative about this. There is still a discrepancy between  Sleep time  As  observed by staff  And reported by patient.  Treatment Plan Summary: Daily contact with patient to assess and evaluate symptoms and progress in treatment Medication management See below  Plan: Continue inpatient  treatment and support  Zyprexa 15 mgrs QHS Zoloft 150 mgrs QDAY  Remeron 45 mgrs QHS Klonopin 1 mgr QHS   Reports tolerating well , o/c side effects  Medical Decision Making Problem Points:  Established problem, stable/improving (1), Review of last therapy session (1) and Review of psycho-social stressors (1) Data Points:  Review of medication regiment & side effects (2)  I certify that inpatient services furnished can reasonably be expected to improve the patient's condition.   [place], [place] 11/06/2014, 2:51 PM

## 2014-11-06 NOTE — B[facility] Group Notes (Signed)
[place] LCSW Group Therapy  11/06/2014 12:44 PM  Type of Therapy:  Group Therapy  Participation Level:  None  Participation Quality:  Appropriate  Affect:  Blunted  Cognitive:  Appropriate  Insight:  Lacking  Engagement in Therapy:  Lacking  Modes of Intervention:  Discussion  Summary of Progress/Problems: Patient participated in group today during which the discussion was about coping strategies. In group we discussed what are negative coping strategies and how we have developed them over the years. Then processed examples of positive coping mechanisms.  The group then processed how to use positive attributes in order to develop their copingmechanisms. Group proceded through discussion and open dialogue.   Christene Lye 11/06/2014, 12:44 PM

## 2014-11-06 NOTE — Progress Notes (Signed)
Patient ID: Kendra Simmons, female   DOB: Dec 03, 1951, 63 y.o.   MRN: [id number]   D: Pt has been very flat and depressed on the unit today, she attended all groups with minimal participation. Pt reported that she was depression and that she was somewhat hopeless. Pt reported being negative SI/HI, no AH/VH noted. A: 15 min checks continued for patient safety. R: Pt safety maintained.

## 2014-11-06 NOTE — Progress Notes (Signed)
Patient ID: Kendra Simmons, female   DOB: [birth year], 63 y.o.   MRN: [id number] D)  Has been in the dayroom this evening, watching tv, was seen laughing and talking to select peers.  Affect was flat and sad during group and when she came to the med door afterward.  Has been compliant with hs meds, stayed up watching tv almost until dayroom was closed for the night, before going to her room and to bed. A)  Will continue to monitor for safety, continue POC R)  Safety maintained.

## 2014-11-06 NOTE — Progress Notes (Signed)
Adult Psychoeducational Group Note  Date:  11/06/2014 Time:  2:25 PM  Group Topic/Focus:  Therapeutic Activity   Participation Level:  Minimal  Participation Quality:  Attentive and Resistant  Affect:  Flat  Cognitive:  Appropriate and Oriented  Insight: Appropriate  Engagement in Group:  Developing/Improving  Modes of Intervention:  Activity, Discussion, Socialization and Support   Elisha Headland 11/06/2014, 2:25 PM

## 2014-11-07 NOTE — [facility] Group Notes (Signed)
[place] Group Notes:  (Nursing/MHT/Case Management/Adjunct)  Date:  11/07/2014  Time:  3:45 PM  Type of Therapy:  Nurse Education  Participation Level:  None  Participation Quality:  Drowsy  Affect:  Flat  Cognitive:  Appropriate  Insight:  None  Engagement in Group:  None  Modes of Intervention:  Discussion and Education  Summary of Progress/Problems: The purpose of this group is to follow up on earlier expressed concerns and rules. The patient was sleeping during group.  Gaylan Gerold E 11/07/2014, 3:45 PM

## 2014-11-07 NOTE — Progress Notes (Signed)
Pt attended NA group this evening.  

## 2014-11-07 NOTE — Progress Notes (Signed)
Patient ID: Kendra Simmons, female   DOB: 01/25/1951, 63 y.o.   MRN: [id number] D)  Spent most of the evening in the dayroom, watching tv before and after group.  Affect is flat, sad, answers brief.  Did attend group and stayed for snack.  Came for meds before going to bed, no c/o's voiced, minimal interaction. A)  Will continue to monitor for safety, continue POC, support, attempt to establish more therapeutic rapport R)  Safety maintained.

## 2014-11-07 NOTE — Plan of Care (Signed)
Problem: Diagnosis: Increased Risk For Suicide Attempt Goal: STG-Patient Will Comply With Medication Regime Outcome: Progressing Patient is complying with medication regimen at this time.

## 2014-11-07 NOTE — Progress Notes (Signed)
Patient ID: Kendra Simmons, female   DOB: Jul 08, 1951, 63 y.o.   MRN: [id number]  D: Pt. Denies SI/HI and A/V Hallucinations. Patient has a flat affect and depressed mood. Patient does not report any pain or discomfort at this time.   A: Support and encouragement provided to the patient to fill out daily inventory sheet however patient refused. Scheduled medications administered to patient per physician's orders. Patient only refused Ensure.   R: Patient is minimal and isolative. Patient appears irritated when asked questions and gives short answers. Patient is seen in the milieu at times. Patient is attending some groups. Q15 minute checks are maintained for safety.

## 2014-11-07 NOTE — Progress Notes (Addendum)
Patient ID: Kendra Simmons, female   DOB: [birth year], 63 y.o.   MRN: [id number] [facility] Group Notes:  (Nursing/MHT/Case Management/Adjunct)  Date:  11/07/2014  Time:  2:33 PM  Type of Therapy:  Nurse Education  Participation Level:  Appropriate  Participation Quality:  Minimal  Affect:  Flat  Cognitive:  Alert  Insight:  None  Engagement in Group:  None  Modes of Intervention:  Discussion and Education  Summary of Progress/Problems: The purpose of this group is to discuss rules and regulations while on the unit. Patients discuss the treatment agreement and group is open for questions or concerns. Patient was invited to group and attended but did not voice any concerns or questions.

## 2014-11-07 NOTE — B[facility] Group Notes (Signed)
[place] LCSW Group Therapy  11/07/2014 3:28 PM  Type of Therapy:  Group Therapy  Participation Level:  Minimal  Participation Quality:  Appropriate  Affect:  Appropriate  Cognitive:  Alert and Oriented  Insight:  None  Engagement in Therapy:  Limited  Modes of Intervention:  Discussion  Summary of Progress/Problems: Group topic was about developing strong support systems.  This group began by discussing the difference between supports and enablers.  Group was able to clearly define supports as positive and enablers as negative.  With some time the group could further identify that the difference between enablers and supporters is the patient. Group empowered patient to see themselves as the leader of their own team and empowered to create appropriate supports from those who had previously been enablers.   Christene Lye 11/07/2014, 3:28 PM

## 2014-11-07 NOTE — Progress Notes (Signed)
Patient ID: Kendra Simmons, female   DOB: September 15, 1951, 63 y.o.   MRN: [id number] [facility] MD Progress Note  11/07/2014 11:38 AM BARBIE CROSTON  MRN:  [id number] Subjective:   Patient continues to state she feels depressed  2/10 and  Anxiety 0/10. Is noted to be present in dayroom but little interaction.  "I feel a little better today, I actually got some sleep"  Denies [facility]  Attending Group  Plan for is to live with her sister on discharge, states "I just worry that nothing will be much better"     Diagnosis:  MDD, consider Panic Disorder      Total Time spent with patient: 25 minutes     ADL's: fair  Sleep: patient describes as poor but slightly improved-nursing staff report patient slept 6(+) hours  Appetite: fair   Suicidal Ideation:  Currently denies any suicidal ideations Homicidal Ideation:  Denies any  AEB (as evidenced by):  Psychiatric Specialty Exam: Physical Exam  Constitutional: She is oriented to person, place, and time. She appears well-developed and well-nourished.  HENT:  Head: Normocephalic and atraumatic.  Neck: Normal range of motion. Neck supple.  Musculoskeletal: Normal range of motion.  Neurological: She is alert and oriented to person, place, and time.  Skin: Skin is warm and dry.    Review of Systems  Constitutional: Positive for weight loss. Negative for fever and chills.  Eyes: Positive for pain. Negative for blurred vision, double vision, photophobia, discharge and redness.       Attributes this to poor sleep   Respiratory: Negative for cough and shortness of breath.   Cardiovascular: Negative for chest pain.  Gastrointestinal: Negative for nausea, vomiting, abdominal pain, diarrhea and constipation.  Genitourinary: Negative for dysuria, urgency and frequency.  Musculoskeletal: Negative for myalgias and neck pain.  Skin: Negative for itching and rash.  Neurological: Positive for headaches. Negative for dizziness, tingling,  seizures and loss of consciousness.  Psychiatric/Behavioral: Positive for depression. The patient has insomnia.     Blood pressure 120/76, pulse 112, temperature 98.7 F (37.1 C), temperature source Oral, resp. rate 16, height 5\' 4"  (1.626 m), weight 69.854 kg (154 lb).Body mass index is 26.42 kg/(m^2).  General Appearance: Fairly Groomed  Engineer, water:: fair  Speech:  Slow  Volume:  Normal  Mood: depressed flat  Affect:  Constricted-   Thought Process:  Goal Directed and Linear  Orientation:  Other:  fully alert and attentive  Thought Content:  no hallucinations, no delusions, ruminative about insomnia  Suicidal Thoughts:  No- At this time denies any suicidal plan or intent and contracts for safety on the unit  Homicidal Thoughts:  No  Memory:  Recent and remote grossly intact.  Judgement:  Fair  Insight:  Fair  Psychomotor Activity:  slowly improving  Concentration:  Good  Recall:  Good  Fund of Knowledge:Good  Language: Good  Akathisia:  Negative  Handed:  Right  AIMS (if indicated):     Assets:  Desire for Improvement Resilience  Sleep:  Number of Hours: 5.75   Musculoskeletal: Strength & Muscle Tone: within normal limits Gait & Station: normal Patient leans: N/A  Current Medications: Current Facility-Administered Medications  Medication Dose Route Frequency Provider Last Rate Last Dose  . acetaminophen (TYLENOL) tablet 650 mg  650 mg Oral Q6H PRN [name], MD   650 mg at 11/04/14 1653  . alum & mag hydroxide-simeth (MAALOX/MYLANTA) 200-200-20 MG/5ML suspension 30 mL  30 mL Oral Q4H PRN [name],  MD      . clonazePAM (KLONOPIN) tablet 1 mg  1 mg Oral QHS Jenne Campus, MD   1 mg at 11/06/14 2202  . feeding supplement (ENSURE COMPLETE) (ENSURE COMPLETE) liquid 237 mL  237 mL Oral TID BM Clayton Bibles, RD   237 mL at 11/05/14 1101  . magnesium hydroxide (MILK OF MAGNESIA) suspension 30 mL  30 mL Oral Daily PRN [name], MD      . mirtazapine (REMERON  SOL-TAB) disintegrating tablet 45 mg  45 mg Oral QHS Jenne Campus, MD   45 mg at 11/06/14 2202  . OLANZapine zydis (ZYPREXA) disintegrating tablet 15 mg  15 mg Oral QHS Jenne Campus, MD   15 mg at 11/06/14 2202  . pantoprazole (PROTONIX) EC tablet 80 mg  80 mg Oral Daily [name], MD   80 mg at 11/07/14 8413  . pravastatin (PRAVACHOL) tablet 20 mg  20 mg Oral q1800 [name], MD   20 mg at 11/06/14 1707  . sertraline (ZOLOFT) tablet 150 mg  150 mg Oral Daily Jenne Campus, MD   150 mg at 11/07/14 2440  . tamsulosin (FLOMAX) capsule 0.4 mg  0.4 mg Oral Daily [name], MD   0.4 mg at 11/07/14 1027    Lab Results:  No results found for this or any previous visit (from the past 48 hour(s)).  Physical Findings: AIMS: Facial and Oral Movements Muscles of Facial Expression: None, normal Lips and Perioral Area: None, normal Jaw: None, normal Tongue: None, normal,Extremity Movements Upper (arms, wrists, hands, fingers): None, normal Lower (legs, knees, ankles, toes): None, normal, Trunk Movements Neck, shoulders, hips: None, normal, Overall Severity Severity of abnormal movements (highest score from questions above): None, normal Incapacitation due to abnormal movements: None, normal Patient's awareness of abnormal movements (rate only patient's report): No Awareness, Dental Status Current problems with teeth and/or dentures?: No Does patient usually wear dentures?: No  CIWA:    COWS:      Assessment: Although still quite depressed, she does seem partially improved compared to her admission status. She is presenting with a slightly improved range of affect, is not suicidal , and although still focused on insomnia, seems less intensely ruminative about this. There is still a discrepancy between  Sleep time  As  observed by staff  And reported by patient.  Treatment Plan Summary: Daily contact with patient to assess and evaluate symptoms and progress in  treatment Medication management See below  Plan: Continue inpatient treatment and support  Zyprexa 15 mgrs QHS Zoloft 150 mgrs QDAY  Remeron 45 mgrs QHS Klonopin 1 mgr QHS   Reports tolerating well , o/c side effects Discussed self responsibility the importance of doing something differently.  We talked about incorporating  A short walk into her day, "I might be able to do that"    Medical Decision Making Problem Points:  Established problem, stable/improving (1), Review of last therapy session (1) and Review of psycho-social stressors (1) Data Points:  Review of medication regiment & side effects (2)  I certify that inpatient services furnished can reasonably be expected to improve the patient's condition.   [place], [place] 11/07/2014, 11:38 AM

## 2014-11-08 MED ORDER — SERTRALINE HCL 100 MG PO TABS
200.0000 mg | ORAL_TABLET | Freq: Every day | ORAL | Status: DC
  Administered 2014-11-09 – 2014-11-15 (×7): 200 mg via ORAL
  Filled 2014-11-08: qty 8
  Filled 2014-11-08 (×9): qty 2

## 2014-11-08 NOTE — [facility] Group Notes (Addendum)
[facility] LCSW Group Therapy          Overcoming Obstacles       1:15 -2:30        11/08/2014       Type of Therapy:  Group Therapy  Participation Level:  Appropriate  Participation Quality:  Appropriate  Affect:  Depressed, Flat  Cognitive:  Attentive Appropriate  Insight: Developing/Improving  Engagement in Therapy: Developing/Imprvoing   Modes of Intervention:  Discussion Exploration  Education Rapport BuildingProblem-Solving Support  Summary of Progress/Problems:  The main focus of today's group was overcoming obstacles.  She advised the obstacle she has to overcome is insomnia.  Patient shared she knows she is dreaming but does not feel she is sleeping enough.  Patient hopeful MD able to prescribe medications that will aid in sleep problems.  She advised she does not sleep during the day.   Kendra Simmons 11/08/2014

## 2014-11-08 NOTE — Progress Notes (Signed)
Patient ID: Kendra Simmons, female   DOB: [birth year], 63 y.o.   MRN: [id number]  DAR: Pt. Denies SI/HI and A/V Hallucinations to this Probation officer. Patient does not report any pain or discomfort at this time. Patient's affect remains flat and mood depressed. Patient continues to refuse to fill out daily inventory sheet even when writer offers to help fill out with her. Support and encouragement provided to the patient. Patient is minimal and forwards little to this Probation officer. Patient is taking medications as prescribed but refuses Ensure. Patient is seen in the day room at times and is attending some groups. Q15 minute checks are maintained for safety.

## 2014-11-08 NOTE — Progress Notes (Signed)
Patient ID: Kendra Simmons, female   DOB: [birth year], 63 y.o.   MRN: [id number] PER STATE REGULATIONS 482.30  THIS CHART WAS REVIEWED FOR MEDICAL NECESSITY WITH RESPECT TO THE PATIENT'S ADMISSION/ DURATION OF STAY.  NEXT REVIEW DATE: 11/10/2014  Chauncy Lean, RN, BSN CASE MANAGER'

## 2014-11-08 NOTE — Progress Notes (Signed)
Patient ID: Kendra Simmons, female   DOB: 1951-04-29, 63 y.o.   MRN: [id number] D)  Spent most of the evening in the dayroom watching tv, has little interaction with peers.  Attended group, had a snack and came to the nursing station for meds before going to bed.  Stated couldn't tell if her meds were making a difference, couldn't tell if she felt any different.  Affect remains flat, depressed mood.  Minimal responses usually,  to questions or attempts at conversation. A)  Will continue to monitor for safety, attempt to establish more therapeutic rapport. R)  Safety maintained.

## 2014-11-08 NOTE — Progress Notes (Signed)
Patient ID: Kendra Simmons, female   DOB: January 25, 1951, 63 y.o.   MRN: [id number] [facility] MD Progress Note  11/08/2014 10:29 AM Kendra Simmons  MRN:  [id number] Subjective:  Patient continues to report depression and insomnia. She does state that she realizes she is dreaming, so this " must mean I am sleeping some". She feels fatigued, tired, and continues to complain of eye pain, but reports this pain as in her eyelids , rather than eyes themselves. Denies any blurry vision, and there is no conjunctivae do not seem injected or red, and there is now lacrimation. Objective:   I have discussed case with treatment team and have met with patient. She remains depressed and sad, although there has been improvement compared to her admission status. She does smile briefly at times/. As above, she remains focused on insomnia, and continues to report neuro-vegetative symptoms of depression , such as low energy, some anhedonia.  She denies SI, and she has not exhibited any self injurious behaviors on the unit. She is tolerating medications well and denies side effects. Of note , there has been no significant appetite increase or weight gain on Zyprexa, and she has not developed any akathisia or signs of EPS.  She is visible in day room but interaction with peers is limited and she tends to keep to self. She does state she had a sleep sutdy years ago which she states was reported to her as positive for sleep apnea. She has not used a CPAP in the past. She was in the process of scheduling a sleep study prior to this admission.  Dx: MDD and   consider Panic Disorder      Total Time spent with patient: 25 minutes     ADL's: fair  Sleep: patient describes as poor but slightly improved-nursing staff report patient slept 6(+) hours  Appetite: fair   Suicidal Ideation:  Currently denies any suicidal ideations Homicidal Ideation:  Denies any  AEB (as evidenced by):  Psychiatric Specialty  Exam: Physical Exam  Constitutional: She is oriented to person, place, and time. She appears well-developed and well-nourished.  HENT:  Head: Normocephalic and atraumatic.  Neck: Normal range of motion. Neck supple.  Musculoskeletal: Normal range of motion.  Neurological: She is alert and oriented to person, place, and time.  Skin: Skin is warm and dry.    Review of Systems  Constitutional: Positive for weight loss. Negative for fever and chills.  Eyes: Positive for pain. Negative for blurred vision, double vision, photophobia, discharge and redness.       Attributes this to poor sleep   Respiratory: Negative for cough and shortness of breath.   Cardiovascular: Negative for chest pain.  Gastrointestinal: Negative for nausea, vomiting, abdominal pain, diarrhea and constipation.  Genitourinary: Negative for dysuria, urgency and frequency.  Musculoskeletal: Negative for myalgias and neck pain.  Skin: Negative for itching and rash.  Neurological: Positive for headaches. Negative for dizziness, tingling, seizures and loss of consciousness.  Psychiatric/Behavioral: Positive for depression. The patient has insomnia.     Blood pressure 101/83, pulse 100, temperature 97.8 F (36.6 C), temperature source Oral, resp. rate 18, height 5' 4" (1.626 m), weight 69.854 kg (154 lb).Body mass index is 26.42 kg/(m^2).  General Appearance: Fairly Groomed  Engineer, water:: good   Speech:  Slow  Volume:  Normal  Mood: depressed  Affect:  Constricted- but reactive, and she does smile at times appropriately  Thought Process:  Goal Directed and Linear  Orientation:  Other:  fully alert and attentive  Thought Content:  no hallucinations, no delusions, ruminative about insomnia  Suicidal Thoughts:  No- At this time denies any suicidal plan or intent and contracts for safety on the unit  Homicidal Thoughts:  No  Memory:  Recent and remote grossly intact.  Judgement:  Fair  Insight:  Fair  Psychomotor  Activity:  slowly improving  Concentration:  Good  Recall:  Good  Fund of Knowledge:Good  Language: Good  Akathisia:  Negative  Handed:  Right  AIMS (if indicated):     Assets:  Desire for Improvement Resilience  Sleep:  Number of Hours: 5.75   Musculoskeletal: Strength & Muscle Tone: within normal limits Gait & Station: normal Patient leans: N/A  Current Medications: Current Facility-Administered Medications  Medication Dose Route Frequency Provider Last Rate Last Dose  . acetaminophen (TYLENOL) tablet 650 mg  650 mg Oral Q6H PRN [name], MD   650 mg at 11/04/14 1653  . alum & mag hydroxide-simeth (MAALOX/MYLANTA) 200-200-20 MG/5ML suspension 30 mL  30 mL Oral Q4H PRN [name], MD      . clonazePAM Bobbye [place]) tablet 1 mg  1 mg Oral QHS Jenne Campus, MD   1 mg at 11/07/14 2204  . feeding supplement (ENSURE COMPLETE) (ENSURE COMPLETE) liquid 237 mL  237 mL Oral TID BM Clayton Bibles, RD   237 mL at 11/07/14 2208  . magnesium hydroxide (MILK OF MAGNESIA) suspension 30 mL  30 mL Oral Daily PRN [name], MD      . mirtazapine (REMERON SOL-TAB) disintegrating tablet 45 mg  45 mg Oral QHS Jenne Campus, MD   45 mg at 11/07/14 2203  . OLANZapine zydis (ZYPREXA) disintegrating tablet 15 mg  15 mg Oral QHS Jenne Campus, MD   15 mg at 11/07/14 2205  . pantoprazole (PROTONIX) EC tablet 80 mg  80 mg Oral Daily [name], MD   80 mg at 11/08/14 0746  . pravastatin (PRAVACHOL) tablet 20 mg  20 mg Oral q1800 [name], MD   20 mg at 11/07/14 1832  . sertraline (ZOLOFT) tablet 150 mg  150 mg Oral Daily Jenne Campus, MD   150 mg at 11/08/14 0745  . tamsulosin (FLOMAX) capsule 0.4 mg  0.4 mg Oral Daily [name], MD   0.4 mg at 11/08/14 0745    Lab Results:  No results found for this or any previous visit (from the past 48 hour(s)).  Physical Findings: AIMS: Facial and Oral Movements Muscles of Facial Expression: None, normal Lips and Perioral Area:  None, normal Jaw: None, normal Tongue: None, normal,Extremity Movements Upper (arms, wrists, hands, fingers): None, normal Lower (legs, knees, ankles, toes): None, normal, Trunk Movements Neck, shoulders, hips: None, normal, Overall Severity Severity of abnormal movements (highest score from questions above): None, normal Incapacitation due to abnormal movements: None, normal Patient's awareness of abnormal movements (rate only patient's report): No Awareness, Dental Status Current problems with teeth and/or dentures?: No Does patient usually wear dentures?: No  CIWA:    COWS:      Assessment: Remains depressed and focused on insomnia. She is sleeping, however, and states she has been dreaming. Nursing notes indicate that patient slept for close to 6 hours last night, which patient doubts.  She is not suicidal but continues to report some anhedonia and other symptoms of depression. She is tolerating medications well.   Treatment Plan Summary: Daily contact with patient to  assess and evaluate symptoms and progress in treatment Medication management See below  Plan: Continue inpatient treatment and support  Zyprexa 15 mgrs QHS Increase Zoloft to 200  mgrs QDAY  Remeron 45 mgrs QHS Klonopin 1 mgr QHS  Patient encouraged to follow up with sleep study promptly after discharge in order to continue working up her long standing complaint of insomnia.   Medical Decision Making Problem Points:  Established problem, stable/improving (1), Review of last therapy session (1) and Review of psycho-social stressors (1) Data Points:  Review of medication regiment & side effects (2) Review of new medications or change in dosage (2)  I certify that inpatient services furnished can reasonably be expected to improve the patient's condition.   Neita Garnet  PMHNP 11/08/2014, 10:29 AM

## 2014-11-08 NOTE — [facility] Group Notes (Signed)
Adult Psychoeducational Group Note  Date:  11/08/2014 Time:  10:37 PM  Group Topic/Focus:  AA Meeting  Participation Level:  None  Participation Quality:  Attentive  Affect:  Flat  Cognitive:  Alert  Insight: None  Engagement in Group:  None  Modes of Intervention:  Discussion and Education  Additional Comments:  Kendra Simmons attended group.  Kendra Simmons A 11/08/2014, 10:37 PM

## 2014-11-08 NOTE — Plan of Care (Signed)
Problem: Diagnosis: Increased Risk For Suicide Attempt Goal: STG-Patient Will Attend All Groups On The Unit Outcome: Progressing Patient is attending groups on the unit at this time.

## 2014-11-09 NOTE — Plan of Care (Signed)
Problem: Alteration in mood Goal: LTG-Pt's behavior demonstrates decreased signs of depression Goal met. Patient is rating depression at one. Patient rated depression at five on admission. Goal is for depression to be rated at three or below on discharge.  Burnis Medin [place] Lebeau, LCSW 11/05/14 11:52 AM  Goal met. Patient is rating depression at three. Goal is for depression to be rated at three or below on discharge.  Burnis Medin Samaya Boardley, LCSW  11/09/14 12:07 PM      (Patient's behavior demonstrates decreased signs of depression to the point the patient is safe to return home and continue treatment in an outpatient setting)  Outcome: Completed/Met Date Met:  11/09/14

## 2014-11-09 NOTE — Progress Notes (Signed)
Patient ID: Kendra Simmons, female   DOB: 11-25-1951, 63 y.o.   MRN: [id number]  DAR: Pt. Denies SI/HI and A/V Hallucinations to this Probation officer. Patient has flat affect and depressed mood.  Patient does not report any pain or discomfort at this time. Support and encouragement provided to the patient .Patient refuses to fill out daily inventory sheet even though patient was encouraged and Probation officer offered to write for patient.  Scheduled medications administered to patient per physician's orders. Patient is continues to be minimal and isolative. Patient is seen in the milieu and is attending groups however does not speak very often other than when directly asked questions. Q15 minute checks are maintained for safety.

## 2014-11-09 NOTE — Plan of Care (Signed)
Problem: Ineffective individual coping Goal: STG: Patient will participate in after care plan Patient will attend attend groups and engage in discussion. Outpatient follow up appointment will be scheduled.  Burnis Medin Almas Rake, LCSW 11/05/2014 11:54 AM  Patient has attended groups and easily engaged in discussions. Outpatient follow up is scheduled with [facility].  Rotunda Worden, LCSW 11/09/2014 12:09 PM     Outcome: Completed/Met Date Met:  11/09/14

## 2014-11-09 NOTE — [facility] Group Notes (Signed)
Adult Psychoeducational Group Note  Date:  11/09/2014 Time:  11:32 PM  Group Topic/Focus:  AA Meeting  Participation Level:  None  Participation Quality:  Attentive  Affect:  Flat  Cognitive:  Alert  Insight: None  Engagement in Group:  None  Modes of Intervention:  Discussion and Education  Additional Comments:  Laelani attended group.  [name] A 11/09/2014, 11:32 PM

## 2014-11-09 NOTE — [facility] Group Notes (Signed)
[place] Group Notes:  (Nursing/MHT/Case Management/Adjunct)  Date:  11/09/2014  Time:  10:37 AM  Type of Therapy:  Nurse Education  Participation Level:  Minimal  Participation Quality:  Inattentive  Affect:  Depressed  Cognitive:  Alert  Insight:  Limited  Engagement in Group:  Off Topic  Modes of Intervention:  Discussion and Education  Summary of Progress/Problems: The purpose of this group is to discuss the topic of the day which is Recovery. Writer speaks to the "road to recovery" and using coping skills and support systems to continue of the road to recovery. Patient came to group late and when asked what patient's goal is for the day she started discussing her eye. Patient never talked about what her goal was for the day.  Gaylan Gerold E 11/09/2014, 10:37 AM

## 2014-11-09 NOTE — Progress Notes (Signed)
Patient ID: Kendra Simmons, female   DOB: June 16, 1951, 63 y.o.   MRN: [id number] [facility] MD Progress Note  11/09/2014 12:56 PM Kendra Simmons  MRN:  [id number] Subjective:  Patient continues to report insomnia and feeling fatigued, tired. She also continues to complain of bilateral eye pain. She had stated that eye pain was caused by lack of sleep, but now states lack of sleep is caused at least in part by bilateral eye pain. Objective:   I have discussed case with treatment team and have met with patient. There has been some modest improvement compared to admission- patient does have better eye contact, a somewhat more reactive affect, and seems less profoundly depressed. She does , however, continue to present depressed, isolative, with limited interactions with staff or peers.  She describes anhedonia, and does not seem to be future oriented at this time- states " I guess I'm just here".  She denies any associated symptoms to eye pain- she does not endorse visual field changes, has no lacrimation , and visual acuity does not seem changed. Denies medication side effects. At this time is on Remeron, Zoloft, Zyprexa. All of these have been titrated recently.    Dx: MDD and   consider Panic Disorder      Total Time spent with patient: 20 minutes    ADL's: fair  Sleep: patient describes as poor but slightly improved-nursing staff report patient slept 6(+) hours  Appetite: fair   Suicidal Ideation:  Currently denies any suicidal ideations Homicidal Ideation:  Denies any  AEB (as evidenced by):  Psychiatric Specialty Exam: Physical Exam  Constitutional: She is oriented to person, place, and time. She appears well-developed and well-nourished.  HENT:  Head: Normocephalic and atraumatic.  Neck: Normal range of motion. Neck supple.  Musculoskeletal: Normal range of motion.  Neurological: She is alert and oriented to person, place, and time.  Skin: Skin is warm and dry.     Review of Systems  Constitutional: Positive for weight loss. Negative for fever and chills.  Eyes: Positive for pain. Negative for blurred vision, double vision, photophobia, discharge and redness.       Attributes this to poor sleep   Respiratory: Negative for cough and shortness of breath.   Cardiovascular: Negative for chest pain.  Gastrointestinal: Negative for nausea, vomiting, abdominal pain, diarrhea and constipation.  Genitourinary: Negative for dysuria, urgency and frequency.  Musculoskeletal: Negative for myalgias and neck pain.  Skin: Negative for itching and rash.  Neurological: Positive for headaches. Negative for dizziness, tingling, seizures and loss of consciousness.  Psychiatric/Behavioral: Positive for depression. The patient has insomnia.     Blood pressure 109/69, pulse 130, temperature 97.9 F (36.6 C), temperature source Oral, resp. rate 18, height _0  (1.626 m), weight 69.854 kg (154 lb).Body mass index is 26.42 kg/(m^2).  General Appearance: Fairly Groomed  Engineer, water:: good   Speech:  Slow  Volume:  Normal  Mood: depressed  Affect:  Constricted  Thought Process:  Goal Directed and Linear  Orientation:  Other:  fully alert and attentive  Thought Content:  denies hallucinations, does not appear internally preoccupied, no dellusions expressed  Suicidal Thoughts:  No- At this time denies any suicidal plan or intent and contracts for safety on the unit  Homicidal Thoughts:  No  Memory:  Recent and remote grossly intact.  Judgement:  Fair  Insight:  Fair  Psychomotor Activity:  slowly improving, but still decreased   Concentration:  Good  Recall:  Good  Fund of Knowledge:Good  Language: Good  Akathisia:  Negative  Handed:  Right  AIMS (if indicated):     Assets:  Desire for Improvement Resilience  Sleep:  Number of Hours: 6.75   Musculoskeletal: Strength & Muscle Tone: within normal limits Gait & Station: normal Patient leans: N/A  Current  Medications: Current Facility-Administered Medications  Medication Dose Route Frequency Provider Last Rate Last Dose  . acetaminophen (TYLENOL) tablet 650 mg  650 mg Oral Q6H PRN [name], MD   650 mg at 11/04/14 1653  . alum & mag hydroxide-simeth (MAALOX/MYLANTA) 200-200-20 MG/5ML suspension 30 mL  30 mL Oral Q4H PRN [name], MD      . clonazePAM Bobbye Charleston) tablet 1 mg  1 mg Oral QHS Jenne Campus, MD   1 mg at 11/08/14 2202  . feeding supplement (ENSURE COMPLETE) (ENSURE COMPLETE) liquid 237 mL  237 mL Oral TID BM Clayton Bibles, RD   237 mL at 11/07/14 2208  . magnesium hydroxide (MILK OF MAGNESIA) suspension 30 mL  30 mL Oral Daily PRN [name], MD      . mirtazapine (REMERON SOL-TAB) disintegrating tablet 45 mg  45 mg Oral QHS Jenne Campus, MD   45 mg at 11/08/14 2202  . OLANZapine zydis (ZYPREXA) disintegrating tablet 15 mg  15 mg Oral QHS Jenne Campus, MD   15 mg at 11/08/14 2202  . pantoprazole (PROTONIX) EC tablet 80 mg  80 mg Oral Daily [name], MD   80 mg at 11/09/14 0835  . pravastatin (PRAVACHOL) tablet 20 mg  20 mg Oral q1800 [name], MD   20 mg at 11/08/14 1837  . sertraline (ZOLOFT) tablet 200 mg  200 mg Oral Daily Jenne Campus, MD   200 mg at 11/09/14 0835  . tamsulosin (FLOMAX) capsule 0.4 mg  0.4 mg Oral Daily [name], MD   0.4 mg at 11/09/14 8916    Lab Results:  No results found for this or any previous visit (from the past 48 hour(s)).  Physical Findings: AIMS: Facial and Oral Movements Muscles of Facial Expression: None, normal Lips and Perioral Area: None, normal Jaw: None, normal Tongue: None, normal,Extremity Movements Upper (arms, wrists, hands, fingers): None, normal Lower (legs, knees, ankles, toes): None, normal, Trunk Movements Neck, shoulders, hips: None, normal, Overall Severity Severity of abnormal movements (highest score from questions above): None, normal Incapacitation due to abnormal movements: None,  normal Patient's awareness of abnormal movements (rate only patient's report): No Awareness, Dental Status Current problems with teeth and/or dentures?: No Does patient usually wear dentures?: No  CIWA:    COWS:      Assessment: Patient remains depressed, sad, anhedonic, isolative. Insomnia, which had been a major complaint , now seems partially improved and as per nursing report, slept most of the night. She is somatically focused , and has been complaining of eye pain - she had characterized it as eyelid pain recently, but today states it is the eyes that hurt. Tolerating medications well. Not suicidal or overtly psychotic.    Treatment Plan Summary: Daily contact with patient to assess and evaluate symptoms and progress in treatment Medication management See below  Plan: Continue inpatient treatment and support  At this time will continue current medication regimen, as well tolerated, and doses recently titrated.  Zyprexa 15 mgrs QHS Increase Zoloft to 200  mgrs QDAY  Remeron 45 mgrs QHS Klonopin 1 mgr QHS  As discussed with  staff, will send to ED for eye exam and intraocular pressure monitorization.    Medical Decision Making Problem Points:  Established problem, stable/improving (1), Review of last therapy session (1) and Review of psycho-social stressors (1) Data Points:  Review of medication regiment & side effects (2) Review of new medications or change in dosage (2)  I certify that inpatient services furnished can reasonably be expected to improve the patient's condition.   COBOS, [place]   11/09/2014, 12:56 PM

## 2014-11-09 NOTE — Progress Notes (Signed)
D: Pt visible within the milieu. However, pt is observed with minimal interactions with others. Pt continues to worry about receiving enough sleep. She reports that her eyes hurt from a lack of sleep as well as the feeling of being drowsy from the meds the following morning. Pt is currently negative for any SI/HI/AVH.  A: Writer administered scheduled medications to pt, per MD orders. Continued support and availability as needed was extended to this pt. Staff continue to monitor pt with q69min checks.  R: No adverse drug reactions noted. Pt receptive to treatment. Pt remains safe at this time.

## 2014-11-09 NOTE — Progress Notes (Signed)
Pt observed in the dayroom watching TV and occasionally talking with her peers.  Pt reports she feels about the same as when she was admitted.  She is hoping for a good night's sleep as she says she does not usually sleep well.  Writer obtained her weight this evening to reassess her nutrition screening, and pt had gained some weight since her admission.  Pt seemed pleased that she had been able to gain weight.  Pt has been pleasant/cooperative with staff this evening.  Pt denies SI/HI/AV at this time.  Her discharge plans are still in process.  Support and encouragement offered. Safety maintained with q15 minute checks.

## 2014-11-09 NOTE — Progress Notes (Signed)
Recreation Therapy Notes  Animal-Assisted Activity/Therapy (AAA/T) Program Checklist/Progress Notes Patient Eligibility Criteria Checklist & Daily Group note for Rec Tx Intervention  Date: 11.24.2015 Time: 2:45pm Location: 12 Film/video editor    AAA/T Program Assumption of Risk Form signed by Patient/ or Parent Legal Guardian yes  Patient is free of allergies or sever asthma yes  Patient reports no fear of animals yes  Patient reports no history of cruelty to animals yes   Patient understands his/her participation is voluntary yes  Patient washes hands before animal contact yes  Patient washes hands after animal contact yes  Behavioral Response: Observation   Education: Contractor, Appropriate Animal Interaction   Education Outcome: Acknowledges education.   Clinical Observations/Feedback: Patient primarily observed session, only petting therapy dog when he approached her.   Laureen Ochs Pier Laux, LRT/CTRS  Lane Hacker 11/09/2014 4:32 PM

## 2014-11-09 NOTE — Tx Team (Signed)
Interdisciplinary Treatment Plan Update   Date Reviewed:  11/09/2014  Time Reviewed:  9:10 AM  Progress in Treatment:   Attending groups: Yes Participating in groups: Yes Taking medication as prescribed: Yes  Tolerating medication: Yes Family/Significant other contact made:  Yes, collateral contact with sister. Patient understands diagnosis: Yes, patient understands diagnosis and need for treatment. Discussing patient identified problems/goals with staff: Yes, patient is able to express goals for treatment and discharge. Medical problems stabilized or resolved: Yes Denies suicidal/homicidal ideation: Yes Patient has not harmed self or others: Yes  For review of initial/current patient goals, please see plan of care.  Estimated Length of Stay: 1-2 days  Reasons for Continued Hospitalization:  Anxiety Depression Medication stabilization  New Problems/Goals identified:    Discharge Plan or Barriers:   Home with outpatient follow up with [facility]  Additional Comments:   Continue medication stabilization  Patient and CSW reviewed patient's identified goals and treatment plan.  Patient verbalized understanding and agreed to treatment plan.   Attendees:  Patient:  11/09/2014 9:10 AM   Signature:  Gabriel Earing, MD 11/09/2014 9:10 AM  Signature:  Carlton Adam, MD 11/09/2014 9:10 AM  Signature:  Eduard Roux, RN 11/09/2014 9:10 AM  Signature:  Grayland Ormond, R 11/09/2014 9:10 AM  Signature:   11/09/2014 9:10 AM  Signature:  Joette Catching, LCSW 11/09/2014 9:10 AM  Signature:  Erasmo Downer Drinkard, LCSW-A 11/09/2014 9:10 AM  Signature:  Lucinda Dell, Care Coordinator [facility] 11/09/2014 9:10 AM  Signature:   11/09/2014 9:10 AM  Signature: Marshall Cork, RN 11/09/2014  9:10 AM  Signature:    11/09/2014  9:10 AM  Signature:   11/09/2014  9:10 AM    Scribe for Treatment Team:   Joette Catching,  11/09/2014 9:10 AM

## 2014-11-09 NOTE — Progress Notes (Signed)
Pt attended spiritual care group on grief and loss facilitated by Counseling intern Martinique Austin and chaplain Jerene Pitch. Group opened with brief discussion and psycho-social ed around grief and loss in relationships and in relation to self - identifying life patterns, circumstances, changes that cause losses. Established group norm of speaking from own life experience. Group goal of establishing open and affirming space for members to share loss and experience with grief, normalize grief experience and provide psycho social education and grief support.  Kendra Simmons was present throughout group.  She was initially attentive to other group members as they shared about losses and supported one another.  Dayonna fell asleep during group and did not contribute to group discussion.   Faulkner, [place]

## 2014-11-09 NOTE — BHH Group Notes (Signed)
[place] LCSW Group Therapy      Feelings About Diagnosis 1:15 - 2:30 PM         11/09/2014 3:37 PM    Type of Therapy:  Group Therapy  Participation Level:  Minimal  Participation Quality: Minimal  Affect:  Appropriate  Cognitive:  Appropriate  Insight:  Developing/Improving  Engagement in Therapy:  Developing/Improving   Modes of Intervention:  Discussion, Education, Exploration, Problem-Solving, Rapport Building, Support  Summary of Progress/Problems:  Patient did not participate in the discussion but remain throughout the group session.  [name] 11/09/2014  3:37 PM

## 2014-11-09 NOTE — Plan of Care (Signed)
Problem: Ineffective individual coping Goal: STG: Patient will remain free from self harm Outcome: Completed/Met Date Met:  11/09/14 Patient has remained free from self harm since admission.

## 2014-11-10 ENCOUNTER — Ambulatory Visit (HOSPITAL_COMMUNITY): Payer: Self-pay | Admitting: Psychiatry

## 2014-11-10 MED ORDER — POLYVINYL ALCOHOL 1.4 % OP SOLN
1.0000 [drp] | OPHTHALMIC | Status: DC | PRN
  Administered 2014-11-12: 1 [drp] via OPHTHALMIC
  Filled 2014-11-10: qty 15

## 2014-11-10 MED ORDER — BUPROPION HCL ER (XL) 150 MG PO TB24
150.0000 mg | ORAL_TABLET | Freq: Every day | ORAL | Status: DC
  Administered 2014-11-10 – 2014-11-15 (×6): 150 mg via ORAL
  Filled 2014-11-10 (×7): qty 1
  Filled 2014-11-10: qty 4
  Filled 2014-11-10: qty 1

## 2014-11-10 NOTE — Progress Notes (Signed)
Patient did attend the evening speaker NA meeting.  

## 2014-11-10 NOTE — Progress Notes (Signed)
Patient ID: Kendra Simmons, female   DOB: 1951-09-15, 63 y.o.   MRN: [id number] [facility] MD Progress Note  11/10/2014 10:04 AM Kendra Simmons  MRN:  [id number] Subjective:  Patient remains " the same".  She is ruminative about eye pain, but as noted in prior notes, she denies any redness , blurry vision.  Objective:   I have discussed case with treatment team and have met with patient. Patient is going to groups , and as discussed with staff, her interactions with peers have improved to a slight degree. She essentially, however, remains depressed, negativistic, endorsing low energy, anhedonia. She is also now concerned because she has found out her sister, with whom she was living, may not allow her to live there long term any longer. As above, she describes ( bilateral) eye pain. Eyes are not red , there is no lacrimation, and does not report associated visual cuts or changes.  I have encouraged patient on several occasions to allow [place] to send her To [facility] ED for intraocular pressure monitoring ( patient does not endorse any history of glaucoma) but patient has refused.  I have spoken with [facility]- report is that in the absence of redness/ lacrimation, it is unlikely she has acute glaucoma and follow up can be done as outpatient. Dry eyes are suspected as cause for pain and lubricating tears are recommended. Patient does appear partially  Improved compared to admission, and at this time has better eye contact and although depressed, tends to exhibit a slightly improved range of affect.   Dx: MDD and   consider Panic Disorder      Total Time spent with patient: 30 minutes    ADL's: fair  Sleep: patient describes as poor but slightly improved-nursing staff report patient slept 6(+) hours  Appetite: fair   Suicidal Ideation:  Currently denies any suicidal ideations Homicidal Ideation:  Denies any  AEB (as evidenced by):  Psychiatric Specialty Exam: Physical Exam   Constitutional: She is oriented to person, place, and time. She appears well-developed and well-nourished.  HENT:  Head: Normocephalic and atraumatic.  Neck: Normal range of motion. Neck supple.  Musculoskeletal: Normal range of motion.  Neurological: She is alert and oriented to person, place, and time.  Skin: Skin is warm and dry.    Review of Systems  Constitutional: Positive for weight loss. Negative for fever and chills.  Eyes: Positive for pain. Negative for blurred vision, double vision, photophobia, discharge and redness.       Attributes this to poor sleep   Respiratory: Negative for cough and shortness of breath.   Cardiovascular: Negative for chest pain.  Gastrointestinal: Negative for nausea, vomiting, abdominal pain, diarrhea and constipation.  Genitourinary: Negative for dysuria, urgency and frequency.  Musculoskeletal: Negative for myalgias and neck pain.  Skin: Negative for itching and rash.  Neurological: Positive for headaches. Negative for dizziness, tingling, seizures and loss of consciousness.  Psychiatric/Behavioral: Positive for depression. The patient has insomnia.     Blood pressure 107/80, pulse 128, temperature 97.8 F (36.6 C), temperature source Oral, resp. rate 16, height _0  (1.626 m), weight 72.576 kg (160 lb).Body mass index is 27.45 kg/(m^2).  General Appearance: Fairly Groomed  Engineer, water:: good   Speech:  Slow  Volume:  Normal  Mood: depressed  Affect:  Constricted, but more reactive  Thought Process:  Goal Directed and Linear  Orientation:  Other:  fully alert and attentive  Thought Content:  denies hallucinations, does not appear internally  preoccupied, no dellusions expressed  Suicidal Thoughts:  No- At this time denies any suicidal plan or intent and contracts for safety on the unit  Homicidal Thoughts:  No  Memory:  Recent and remote grossly intact.  Judgement:  Fair  Insight:  Fair  Psychomotor Activity:  slowly improving, but still  decreased   Concentration:  Good  Recall:  Good  Fund of Knowledge:Good  Language: Good  Akathisia:  Negative  Handed:  Right  AIMS (if indicated):     Assets:  Desire for Improvement Resilience  Sleep:  Number of Hours: 6.75   Musculoskeletal: Strength & Muscle Tone: within normal limits Gait & Station: normal Patient leans: N/A  Current Medications: Current Facility-Administered Medications  Medication Dose Route Frequency Provider Last Rate Last Dose  . acetaminophen (TYLENOL) tablet 650 mg  650 mg Oral Q6H PRN [name], MD   650 mg at 11/04/14 1653  . alum & mag hydroxide-simeth (MAALOX/MYLANTA) 200-200-20 MG/5ML suspension 30 mL  30 mL Oral Q4H PRN [name], MD      . clonazePAM Bobbye [place]) tablet 1 mg  1 mg Oral QHS Jenne Campus, MD   1 mg at 11/09/14 2208  . feeding supplement (ENSURE COMPLETE) (ENSURE COMPLETE) liquid 237 mL  237 mL Oral TID BM Clayton Bibles, RD   237 mL at 11/07/14 2208  . magnesium hydroxide (MILK OF MAGNESIA) suspension 30 mL  30 mL Oral Daily PRN [name], MD      . mirtazapine (REMERON SOL-TAB) disintegrating tablet 45 mg  45 mg Oral QHS Jenne Campus, MD   45 mg at 11/09/14 2209  . OLANZapine zydis (ZYPREXA) disintegrating tablet 15 mg  15 mg Oral QHS Jenne Campus, MD   15 mg at 11/09/14 2208  . pantoprazole (PROTONIX) EC tablet 80 mg  80 mg Oral Daily [name], MD   80 mg at 11/10/14 0814  . pravastatin (PRAVACHOL) tablet 20 mg  20 mg Oral q1800 [name], MD   20 mg at 11/09/14 1752  . sertraline (ZOLOFT) tablet 200 mg  200 mg Oral Daily Jenne Campus, MD   200 mg at 11/10/14 0815  . tamsulosin (FLOMAX) capsule 0.4 mg  0.4 mg Oral Daily [name], MD   0.4 mg at 11/10/14 0762    Lab Results:  No results found for this or any previous visit (from the past 48 hour(s)).  Physical Findings: AIMS: Facial and Oral Movements Muscles of Facial Expression: None, normal Lips and Perioral Area: None, normal Jaw:  None, normal Tongue: None, normal,Extremity Movements Upper (arms, wrists, hands, fingers): None, normal Lower (legs, knees, ankles, toes): None, normal, Trunk Movements Neck, shoulders, hips: None, normal, Overall Severity Severity of abnormal movements (highest score from questions above): None, normal Incapacitation due to abnormal movements: None, normal Patient's awareness of abnormal movements (rate only patient's report): No Awareness, Dental Status Current problems with teeth and/or dentures?: No Does patient usually wear dentures?: No  CIWA:    COWS:      Assessment: Ongoing depression, sadness, negativism, tendency to ruminate about ocular pain. Sleep is improved and range of affect is slightly improved as well. Tolerating medications well. Denies side effects.  Denies SI. We discussed medication issues- Zoloft  and Remeron recently titrated and Zyprexa relatively new medication for her. We discussed options- she states she was on Wellbutrin XL a long time ago and does not remember having had side effects. Does not  remember how much it helped but  possibly took for only short period of time.  No history of seizures or Eating Disorder.   Treatment Plan Summary: Daily contact with patient to assess and evaluate symptoms and progress in treatment Medication management See below  Plan: Continue inpatient treatment and support  Start Wellbutrin XL 150 mgrs QAM Zyprexa 15 mgrs QHS Zoloft to 200  mgrs QDAY  Remeron 45 mgrs QHS Klonopin 1 mgr QHS  As discussed with staff,  Encourage patient to go to ED for eye exam and intraocular pressure monitorization.  ( Thus far patient has refused )   Medical Decision Making Problem Points:  Established problem, stable/improving (1), Review of last therapy session (1) and Review of psycho-social stressors (1) Data Points:  Review of medication regiment & side effects (2) Review of new medications or change in dosage (2)  I certify that  inpatient services furnished can reasonably be expected to improve the patient's condition.   COBOS, [place]   11/10/2014, 10:04 AM

## 2014-11-10 NOTE — BHH Group Notes (Signed)
CSW met with patient after group to encourage patient to speak with her sister regarding housing arrangements at discharge. Patient agreeable to contact her sister today.  Kendra Simmons, MSW, [place] Worker [facility] [phone]

## 2014-11-10 NOTE — Plan of Care (Signed)
Problem: Consults Goal: Suicide Risk Patient Education (See Patient Education module for education specifics)  Outcome: Completed/Met Date Met:  11/10/14 Discussed suicidal thoughts with patient.

## 2014-11-10 NOTE — BHH Group Notes (Signed)
[place] LCSW Group Therapy 11/10/2014  1:15 PM Type of Therapy: Group Therapy Participation Level: Active  Participation Quality: Attentive, Sharing and Supportive  Affect: Depressed and Flat  Cognitive: Alert and Oriented  Insight: Developing/Improving and Engaged  Engagement in Therapy: Developing/Improving and Engaged  Modes of Intervention: Clarification, Confrontation, Discussion, Education, Exploration, Limit-setting, Orientation, Problem-solving, Rapport Building, Art therapist, Socialization and Support  Summary of Progress/Problems: The topic for group today was emotional regulation. This group focused on both positive and negative emotion identification and allowed group members to process ways to identify feelings, regulate negative emotions, and find healthy ways to manage internal/external emotions. Group members were asked to reflect on a time when their reaction to an emotion led to a negative outcome and explored how alternative responses using emotion regulation would have benefited them. Group members were also asked to discuss a time when emotion regulation was utilized when a negative emotion was experienced. Patient reports feeling hopeless and states that she is afraid to speak with her sister because she thinks that her sister may not let her return home at discharge. CSW explored these concerns with patient and encouraged her to speak with her sister today to discuss living arrangements. CSW provided emotional support and encouragement.  Tilden Fossa, MSW, [place] Worker [facility] [phone]

## 2014-11-10 NOTE — Progress Notes (Signed)
D: Pt denies SI/HI/AVH. Pt is pleasant and cooperative. Pt presents flat, sad and depressed. Pt appears to have optimistic outlook. Pt still concerned about where she will live when she leaves.   A: Pt was offered support and encouragement. Pt was given scheduled medications. Pt was encourage to attend groups. Q 15 minute checks were done for safety.   R:Pt attends groups and interacts well with peers and staff. Pt is taking medication.Pt receptive to treatment and safety maintained on unit.

## 2014-11-10 NOTE — Progress Notes (Addendum)
Patient has refused 3 times to go to ED for vision check.  Charge nurse and MD informed.  Patient has also refused ensure today.  Patient has been sitting in dayroom this morning and this afternoon.  Patient refused to go to ED for vision check in the late afternoon.  Charge nurse informed.  Patient went to dining room for dinner.  D:  Patient stated she slept some last night, but did not actually remember.  No visitors last night.  Patient believes that her sister told her she could only stay a couple days with her after discharge.  Not sure where she will live after being at her sister's home.  Feels she has no hope for the future.  Stated her income per month is approximately $3,000.  Does not have a car payment.  Been depressed since July.  Can't stand to hear rain.  Hearing is hyper sensitive.  Clock ticking, etc.  Does not feel she has improved since admission to [facility]. A:  Patient refuses ensure.  Patient will take her medications as prescribed by MD.  Emotional support and encouragement given patient. R:  Denied SI and HI.  Denied A/V hallucinations.  Safety maintained with 15 minute checks.

## 2014-11-10 NOTE — BHH Group Notes (Signed)
   [facility] LCSW Aftercare Discharge Planning Group Note  11/10/2014  8:45 AM   Participation Quality: Alert, Appropriate and Oriented  Mood/Affect: Depressed and Flat  Depression Rating: 5  Anxiety Rating: 5  Thoughts of Suicide: Pt denies SI/HI  Will you contract for safety? Yes  Current AVH: Pt denies  Plan for Discharge/Comments: Pt attended discharge planning group and actively participated in group. CSW provided pt with today's workbook. Patient agreeable to return home with her sister and follow up with [facility] with Dr. Adele Schilder on 12/10. Patient continues to complain of her eyes hurting her and concern regarding her sleep.   Transportation Means: Pt reports access to transportation  Supports: Patient has identified her sister as a support  Tilden Fossa, MSW, [place] Worker [employer] [phone]

## 2014-11-10 NOTE — Progress Notes (Signed)
Patient ID: Kendra Simmons, female   DOB: June 16, 1951, 63 y.o.   MRN: [id number] PER STATE REGULATIONS 482.30  THIS CHART WAS REVIEWED FOR MEDICAL NECESSITY WITH RESPECT TO THE PATIENT'S ADMISSION/ DURATION OF STAY.  NEXT REVIEW DATE: 11/14/2014  Chauncy Lean, RN, BSN CASE MANAGER

## 2014-11-11 NOTE — Plan of Care (Signed)
Problem: Diagnosis: Increased Risk For Suicide Attempt Goal: LTG-Patient Will Show Positive Response to Medication LTG (by discharge) : Patient will show positive response to medication and will participate in the development of the discharge plan.  Outcome: Not Progressing Pt continues to be depressed  Problem: Ineffective individual coping Goal: LTG: Patient will report a decrease in negative feelings Outcome: Progressing Pt denies SI/HI, but continues to be flat and sad

## 2014-11-11 NOTE — Progress Notes (Signed)
Patient ID: Kendra Simmons, female   DOB: 1951/02/21, 63 y.o.   MRN: [id number]  D: Pt currently presents with a flat affect and irritable mood. Pt asked to fill out self inventory and pt stated "I will try but I dunno." Pt has not attended groups this morning.   A: Pt provided with medications per providers orders. Pt's labs and vitals were monitored throughout the day. Pt supported emotionally and encouraged to express concerns and questions. Pt educated on medication therapy.   R: Pt's safety ensured with 15 minute and environmental checks. Pt currently denies SI/HI and A/V hallucinations. Pt verbally agrees to seek staff if SI/HI or A/VH occurs and to consult with staff before acting on these thoughts. Pt has been in bed throughout most of the day today however she did come to the dayroom to watch football for a while after lunch time.   Elenore Rota, RN

## 2014-11-11 NOTE — Progress Notes (Signed)
Patient ID: Kendra Simmons, female   DOB: July 11, 1951, 63 y.o.   MRN: [id number] [facility] MD Progress Note  11/11/2014 2:56 PM FRIMY UFFELMAN  MRN:  [id number]  Subjective:  Kendra Simmons reports "I have chronic insomnia. Zyprexa has been helping me sleep a little.".  She is ruminative about eye pain, but, no redness, drainage noted, denies blurry vision.  Objective:   I have discussed case with treatment team and have met with patient. Patient is going to groups , and as discussed with staff, her interactions with peers have improved to a slight degree. She essentially, denies feeling depressed.  Dx: MDD and   consider Panic Disorder      Total Time spent with patient: 30 minutes  ADL's: fair  Sleep: patient describes as poor but slightly improved-nursing staff report patient slept 6(+) hours  Appetite: fair   Suicidal Ideation:  Currently denies any suicidal ideations Homicidal Ideation:  Denies any  AEB (as evidenced by):  Psychiatric Specialty Exam: Physical Exam  Constitutional: She is oriented to person, place, and time. She appears well-developed and well-nourished.  HENT:  Head: Normocephalic and atraumatic.  Neck: Normal range of motion. Neck supple.  Musculoskeletal: Normal range of motion.  Neurological: She is alert and oriented to person, place, and time.  Skin: Skin is warm and dry.    Review of Systems  Constitutional: Positive for weight loss. Negative for fever and chills.  Eyes: Positive for pain. Negative for blurred vision, double vision, photophobia, discharge and redness.       Attributes this to poor sleep   Respiratory: Negative for cough and shortness of breath.   Cardiovascular: Negative for chest pain.  Gastrointestinal: Negative for nausea, vomiting, abdominal pain, diarrhea and constipation.  Genitourinary: Negative for dysuria, urgency and frequency.  Musculoskeletal: Negative for myalgias and neck pain.  Skin: Negative for itching and rash.   Neurological: Positive for headaches. Negative for dizziness, tingling, seizures and loss of consciousness.  Psychiatric/Behavioral: Positive for depression. The patient has insomnia.     Blood pressure 116/52, pulse 78, temperature 98 F (36.7 C), temperature source Oral, resp. rate 20, height _0  (1.626 m), weight 72.576 kg (160 lb).Body mass index is 27.45 kg/(m^2).  General Appearance: Fairly Groomed  Engineer, water:: good   Speech:  Slow  Volume:  Normal  Mood: depressed  Affect:  Constricted, but more reactive  Thought Process:  Goal Directed and Linear  Orientation:  Other:  fully alert and attentive  Thought Content:  denies hallucinations, does not appear internally preoccupied, no dellusions expressed  Suicidal Thoughts:  No- At this time denies any suicidal plan or intent and contracts for safety on the unit  Homicidal Thoughts:  No  Memory:  Recent and remote grossly intact.  Judgement:  Fair  Insight:  Fair  Psychomotor Activity:  slowly improving, but still decreased   Concentration:  Good  Recall:  Good  Fund of Knowledge:Good  Language: Good  Akathisia:  Negative  Handed:  Right  AIMS (if indicated):     Assets:  Desire for Improvement Resilience  Sleep:  Number of Hours: 6.75   Musculoskeletal: Strength & Muscle Tone: within normal limits Gait & Station: normal Patient leans: N/A  Current Medications: Current Facility-Administered Medications  Medication Dose Route Frequency Provider Last Rate Last Dose  . acetaminophen (TYLENOL) tablet 650 mg  650 mg Oral Q6H PRN [name], MD   650 mg at 11/04/14 1653  . alum & mag hydroxide-simeth (MAALOX/MYLANTA)  200-200-20 MG/5ML suspension 30 mL  30 mL Oral Q4H PRN [name], MD      . buPROPion (WELLBUTRIN XL) 24 hr tablet 150 mg  150 mg Oral Daily Jenne Campus, MD   150 mg at 11/11/14 0805  . clonazePAM (KLONOPIN) tablet 1 mg  1 mg Oral QHS Jenne Campus, MD   1 mg at 11/10/14 2207  . feeding  supplement (ENSURE COMPLETE) (ENSURE COMPLETE) liquid 237 mL  237 mL Oral TID BM Clayton Bibles, RD   237 mL at 11/07/14 2208  . magnesium hydroxide (MILK OF MAGNESIA) suspension 30 mL  30 mL Oral Daily PRN [name], MD      . mirtazapine (REMERON SOL-TAB) disintegrating tablet 45 mg  45 mg Oral QHS Jenne Campus, MD   45 mg at 11/10/14 2207  . OLANZapine zydis (ZYPREXA) disintegrating tablet 15 mg  15 mg Oral QHS Jenne Campus, MD   15 mg at 11/10/14 2207  . pantoprazole (PROTONIX) EC tablet 80 mg  80 mg Oral Daily [name], MD   80 mg at 11/11/14 0810  . polyvinyl alcohol (LIQUIFILM TEARS) 1.4 % ophthalmic solution 1 drop  1 drop Both Eyes PRN Myer Peer Cobos, MD      . pravastatin (PRAVACHOL) tablet 20 mg  20 mg Oral q1800 [name], MD   20 mg at 11/10/14 1828  . sertraline (ZOLOFT) tablet 200 mg  200 mg Oral Daily Jenne Campus, MD   200 mg at 11/11/14 0806  . tamsulosin (FLOMAX) capsule 0.4 mg  0.4 mg Oral Daily [name], MD   0.4 mg at 11/11/14 1201    Lab Results:  No results found for this or any previous visit (from the past 48 hour(s)).  Physical Findings: AIMS: Facial and Oral Movements Muscles of Facial Expression: None, normal Lips and Perioral Area: None, normal Jaw: None, normal Tongue: None, normal,Extremity Movements Upper (arms, wrists, hands, fingers): None, normal Lower (legs, knees, ankles, toes): None, normal, Trunk Movements Neck, shoulders, hips: None, normal, Overall Severity Severity of abnormal movements (highest score from questions above): None, normal Incapacitation due to abnormal movements: None, normal Patient's awareness of abnormal movements (rate only patient's report): No Awareness, Dental Status Current problems with teeth and/or dentures?: No Does patient usually wear dentures?: No  CIWA:  CIWA-Ar Total: 1 COWS:  COWS Total Score: 1   Assessment: Ongoing depression, sadness, negativism, tendency to ruminate about  ocular pain. Sleep is improved and range of affect is slightly improved as well. Tolerating medications well. Denies side effects.  Denies SI. We discussed medication issues- Zoloft  and Remeron recently titrated and Zyprexa relatively new medication for her. We discussed options- she states she was on Wellbutrin XL a long time ago and does not remember having had side effects. Does not remember how much it helped but  possibly took for only short period of time.  No history of seizures or Eating Disorder.   Treatment Plan Summary: Daily contact with patient to assess and evaluate symptoms and progress in treatment Medication management See below  Plan: Continue inpatient treatment and support  Continue Wellbutrin XL 150 mgrs QAM, Zoloft 200  mgrs daily  for depression, Zyprexa 15 mgrs QHS for sleep Continue Remeron 45 mgrs QHS Klonopin 1 mgr QHS    Medical Decision Making Problem Points:  Established problem, stable/improving (1), Review of last therapy session (1) and Review of psycho-social stressors (1) Data Points:  Review of medication regiment & side effects (2) Review of new medications or change in dosage (2)  I certify that inpatient services furnished can reasonably be expected to improve the patient's condition.   Lindell Spar I , PMHNP-BC 11/11/2014, 2:56 PM I agreed with the findings, treatment and disposition plan of this patient. Berniece Andreas, MD

## 2014-11-11 NOTE — BHH Group Notes (Signed)
Adult Psychoeducational Group Note  Date:  11/11/2014 Time:  12:23 PM  Group Topic/Focus:   Orientation:   The focus of this group is to educate the patient on the purpose and policies of crisis stabilization and provide a format to answer questions about their admission.  The group details unit policies and expectations of patients while admitted.  Participation Level:  Did Not Attend  Participation Quality:   Affect:    Cognitive:   Insight:   Engagement in Group:  n/a  Modes of Intervention:    Additional Comments:  Patient asleep in bed.   Elenore Rota 11/11/2014, 12:23 PM

## 2014-11-11 NOTE — Progress Notes (Signed)
Adult Psychoeducational Group Note  Date:  11/11/2014 Time:  8:27 PM  Group Topic/Focus:  Wrap-Up Group:   The focus of this group is to help patients review their daily goal of treatment and discuss progress on daily workbooks.  Participation Level:  Active  Participation Quality:  Appropriate  Affect:  Appropriate  Cognitive:  Appropriate  Insight: Appropriate  Engagement in Group:  Engaged  Modes of Intervention:  Discussion  Additional Comments:  Pt was present for wrap up group. Pt shared her goal today was to go to lunch, she said she accomplished this. She also complained that her eyes hurt. Her affect was flat.  [name] A 11/11/2014, 8:27 PM

## 2014-11-12 DIAGNOSIS — R45851 Suicidal ideations: Secondary | ICD-10-CM

## 2014-11-12 MED ORDER — POLYVINYL ALCOHOL 1.4 % OP SOLN: 1.0000 [drp] | OPHTHALMIC | Status: DC | PRN

## 2014-11-12 NOTE — Progress Notes (Signed)
Patient ID: Kendra Simmons, female   DOB: March 22, 1951, 63 y.o.   MRN: [id number]  D: Pt currently presents with a flat affect and depressed behavior. Per refuses to complete a self inventory sheet. Pt reports mild pain in her eyes but states "nothing helps." Pt has been attending groups but stays in her scrubs, wrapped in a blanket. Pt is thought blocking.    A: Pt provided with medications per providers orders. Pt's labs and vitals were monitored throughout the day. Pt supported emotionally and encouraged to express concerns and questions. Pt consulted with provider and Probation officer. Pt educated on fall risk prevention. Pt offered PRN pain medications but refused them.   R: Pt's safety ensured with 15 minute and environmental checks. Pt currently denies SI/HI and A/V hallucinations. Pt verbally agrees to seek staff if SI/HI or A/VH occurs and to consult with staff before acting on these thoughts. Pt acknowledges that it would be a positive step if she reached out to her sister to let her know about the issue of heating in her apartment. Pt given eye drops for the pain in her eyes.    Elenore Rota, RN

## 2014-11-12 NOTE — Plan of Care (Signed)
Problem: Diagnosis: Increased Risk For Suicide Attempt Goal: LTG-Patient Will Report Absence of Withdrawal Symptoms LTG (by discharge): Patient will report absence of withdrawal symptoms.  Outcome: Progressing Pt denies withdrawal Sx at this time Goal: STG-Patient Will Report Suicidal Feelings to Staff Outcome: Progressing Pt denies SI- but contracts for safety

## 2014-11-12 NOTE — BHH Group Notes (Signed)
   [facility] LCSW Aftercare Discharge Planning Group Note  11/12/2014  8:45 AM   Participation Quality: Alert, Appropriate and Oriented  Mood/Affect: Depressed and Flat  Depression Rating: 0  Anxiety Rating: 0  Thoughts of Suicide: Pt denies SI/HI  Will you contract for safety? Yes  Current AVH: Pt denies  Plan for Discharge/Comments: Pt attended discharge planning group and actively participated in group. CSW provided pt with today's workbook. Patient discussed feeling "okay" today and reports that her eyes continue to bother her. She denies depression or anxiety. Plans to return home with her sister and follow up with [facility].  Transportation Means: Pt reports access to transportation  Supports: Sister has been identified as a support.  Tilden Fossa, MSW, [place] Worker [facility] [phone]

## 2014-11-12 NOTE — Progress Notes (Signed)
Patient ID: Kendra Simmons, female   DOB: [birth year], 63 y.o.   MRN: [id number] Patient ID: Kendra Simmons, female   DOB: Jul 16, 1951, 63 y.o.   MRN: [id number] [facility] MD Progress Note  11/12/2014 9:53 AM Kendra Simmons  MRN:  [id number]  Subjective:  Kendra Simmons reports "I slept well last night. What is bothering me now is my eyes, the feel very dry at the eyelids.  Objective:   I have discussed case with treatment team and have met with patient. Patient is going to groups , and as discussed with staff, her interactions with peers have improved to a slight degree. She essentially, denies feeling depressed as far as her sleep is improving. Will order artificial tears for eye dryness.  Dx: MDD and   consider Panic Disorder      Total Time spent with patient: 30 minutes  ADL's: fair  Sleep: patient describes as poor but slightly improved-nursing staff report patient slept 6(+) hours  Appetite: fair   Suicidal Ideation:  Currently denies any suicidal ideations Homicidal Ideation:  Denies any  AEB (as evidenced by):  Psychiatric Specialty Exam: Physical Exam  Constitutional: She is oriented to person, place, and time. She appears well-developed and well-nourished.  HENT:  Head: Normocephalic and atraumatic.  Neck: Normal range of motion. Neck supple.  Musculoskeletal: Normal range of motion.  Neurological: She is alert and oriented to person, place, and time.  Skin: Skin is warm and dry.    Review of Systems  Constitutional: Positive for weight loss. Negative for fever and chills.  Eyes: Positive for pain. Negative for blurred vision, double vision, photophobia, discharge and redness.       Attributes this to poor sleep   Respiratory: Negative for cough and shortness of breath.   Cardiovascular: Negative for chest pain.  Gastrointestinal: Negative for nausea, vomiting, abdominal pain, diarrhea and constipation.  Genitourinary: Negative for dysuria, urgency and frequency.   Musculoskeletal: Negative for myalgias and neck pain.  Skin: Negative for itching and rash.  Neurological: Positive for headaches. Negative for dizziness, tingling, seizures and loss of consciousness.  Psychiatric/Behavioral: Positive for depression. The patient has insomnia.     Blood pressure 95/52, pulse 112, temperature 98.1 F (36.7 C), temperature source Oral, resp. rate 20, height _0  (1.626 m), weight 72.576 kg (160 lb).Body mass index is 27.45 kg/(m^2).  General Appearance: Fairly Groomed  Engineer, water:: good   Speech:  Slow  Volume:  Normal  Mood: depressed  Affect:  Constricted, but more reactive  Thought Process:  Goal Directed and Linear  Orientation:  Other:  fully alert and attentive  Thought Content:  denies hallucinations, does not appear internally preoccupied, no dellusions expressed  Suicidal Thoughts:  No- At this time denies any suicidal plan or intent and contracts for safety on the unit  Homicidal Thoughts:  No  Memory:  Recent and remote grossly intact.  Judgement:  Fair  Insight:  Fair  Psychomotor Activity:  slowly improving, but still decreased   Concentration:  Good  Recall:  Good  Fund of Knowledge:Good  Language: Good  Akathisia:  Negative  Handed:  Right  AIMS (if indicated):     Assets:  Desire for Improvement Resilience  Sleep:  Number of Hours: 6.75   Musculoskeletal: Strength & Muscle Tone: within normal limits Gait & Station: normal Patient leans: N/A  Current Medications: Current Facility-Administered Medications  Medication Dose Route Frequency Provider Last Rate Last Dose  . acetaminophen (TYLENOL) tablet 650 mg  650 mg Oral Q6H PRN [name], MD   650 mg at 11/04/14 1653  . alum & mag hydroxide-simeth (MAALOX/MYLANTA) 200-200-20 MG/5ML suspension 30 mL  30 mL Oral Q4H PRN [name], MD      . buPROPion (WELLBUTRIN XL) 24 hr tablet 150 mg  150 mg Oral Daily Jenne Campus, MD   150 mg at 11/12/14 0926  . clonazePAM  (KLONOPIN) tablet 1 mg  1 mg Oral QHS Jenne Campus, MD   1 mg at 11/11/14 2236  . feeding supplement (ENSURE COMPLETE) (ENSURE COMPLETE) liquid 237 mL  237 mL Oral TID BM Clayton Bibles, RD   237 mL at 11/07/14 2208  . magnesium hydroxide (MILK OF MAGNESIA) suspension 30 mL  30 mL Oral Daily PRN [name], MD      . mirtazapine (REMERON SOL-TAB) disintegrating tablet 45 mg  45 mg Oral QHS Jenne Campus, MD   45 mg at 11/11/14 2236  . OLANZapine zydis (ZYPREXA) disintegrating tablet 15 mg  15 mg Oral QHS Jenne Campus, MD   15 mg at 11/11/14 2236  . pantoprazole (PROTONIX) EC tablet 80 mg  80 mg Oral Daily [name], MD   80 mg at 11/12/14 0927  . polyvinyl alcohol (LIQUIFILM TEARS) 1.4 % ophthalmic solution 1 drop  1 drop Both Eyes PRN Myer Peer Cobos, MD      . pravastatin (PRAVACHOL) tablet 20 mg  20 mg Oral q1800 [name], MD   20 mg at 11/11/14 1810  . sertraline (ZOLOFT) tablet 200 mg  200 mg Oral Daily Jenne Campus, MD   200 mg at 11/12/14 5176  . tamsulosin (FLOMAX) capsule 0.4 mg  0.4 mg Oral Daily [name], MD   0.4 mg at 11/12/14 1607    Lab Results:  No results found for this or any previous visit (from the past 48 hour(s)).  Physical Findings: AIMS: Facial and Oral Movements Muscles of Facial Expression: None, normal Lips and Perioral Area: None, normal Jaw: None, normal Tongue: None, normal,Extremity Movements Upper (arms, wrists, hands, fingers): None, normal Lower (legs, knees, ankles, toes): None, normal, Trunk Movements Neck, shoulders, hips: None, normal, Overall Severity Severity of abnormal movements (highest score from questions above): None, normal Incapacitation due to abnormal movements: None, normal Patient's awareness of abnormal movements (rate only patient's report): No Awareness, Dental Status Current problems with teeth and/or dentures?: No Does patient usually wear dentures?: No  CIWA:  CIWA-Ar Total: 1 COWS:  COWS Total  Score: 1   Assessment: Ongoing depression, sadness, negativism, tendency to ruminate about ocular pain. Sleep is improved and range of affect is slightly improved as well. Tolerating medications well. Denies side effects.  Denies SI. We discussed medication issues- Zoloft  and Remeron recently titrated and Zyprexa relatively new medication for her, add artificial tears bilateral eyes prn. We discussed options- she states she was on Wellbutrin XL a long time ago and does not remember having had side effects. Does not remember how much it helped but  possibly took for only short period of time.  No history of seizures or Eating Disorder.  Treatment Plan Summary: Daily contact with patient to assess and evaluate symptoms and progress in treatment Medication management See below  Plan: Continue inpatient treatment and support  Continue Wellbutrin XL 150 mgrs QAM, Zoloft 200  mgrs daily  for depression, Zyprexa 15 mgrs QHS for sleep. Continue Remeron 45 mgrs QHS, Klonopin 1 mgr  QHS. Initiate artificial tears to bilateral eyes prn. Discharge plans in progress.  Medical Decision Making Problem Points:  Established problem, stable/improving (1), Review of last therapy session (1) and Review of psycho-social stressors (1) Data Points:  Review of medication regiment & side effects (2) Review of new medications or change in dosage (2)  I certify that inpatient services furnished can reasonably be expected to improve the patient's condition.   Lindell Spar I , PMHNP-BC 11/12/2014, 9:53 AM I agreed with the findings, treatment and disposition plan of this patient. Berniece Andreas, MD

## 2014-11-12 NOTE — Progress Notes (Signed)
D: Pt denies SI/HI/AVH. Pt is pleasant and cooperative. Pt presents flat, blunted and depressed on approach. Pt affect  brightens when talking. Pt still has not talked to her sister, states she plans to. Pt still worried about her heating situation at home and why sister won't let pt stay with her for more than a few days. Pt continues top think issues with her eyes are due to fatigue and not medical.   A: Pt was offered support and encouragement. Pt was given scheduled medications. Pt was encourage to attend groups. Q 15 minute checks were done for safety. Encouraged pt to seek further evaluation for issues with her eyes.   R:Pt attends groups and interacts well with peers and staff. Pt is taking medication. Pt has no complaints at this time  .Pt receptive to treatment and safety maintained on unit.

## 2014-11-12 NOTE — Progress Notes (Signed)
D: Pt denies SI/HI/AVH. Pt is pleasant and cooperative. Pt to present flat and depressed. Pt continues to sleep when checked on at night and based on 15 min check sheet. Pt is worried that she has no heat in her house, and she has no place to go when she leaves [facility]. Pt was encouraged to inform family members of her housing situation and see if they could help.   A: Pt was offered support and encouragement. Pt was given scheduled medications. Pt was encourage to attend groups. Q 15 minute checks were done for safety.   R:Pt attends groups and interacts well with peers and staff. Pt is taking medication.Pt receptive to treatment and safety maintained on unit.

## 2014-11-13 NOTE — Progress Notes (Signed)
The patient attended the A.A. Meeting this evening and was appropriate. The patient fell asleep for brief periods of time and was able to make a relevant comment in front of the group.

## 2014-11-13 NOTE — Progress Notes (Signed)
Pt did attend group this evening.  

## 2014-11-13 NOTE — Progress Notes (Signed)
D Pt. Denies SI and Hi, no reports of pain or discomfort noted this pm.  A Writer offered support and encouragement.  Encouraged pt. To ambulate during the day and possibly take a warm shower in the evening to promote sleep,.  R Pt. Remains safe on the unit,  Pt. Denies any depression or anxiety but continues to report she is not sleeping although she spends a lot of time in bed.  Will continue to encourage pt. To stay up during the day to increase her intake and promote  Better rest at night.

## 2014-11-13 NOTE — [facility] Group Notes (Signed)
0900 nursing orientation group  The focus of this group is to educate the patient on the purpose and policies of crisis stabilization and provide a format to answer questions about their admission.  The group details unit policies and expectations of patients while admitted.  Pt was in group she did not share with the group but she appeared attentive by looking at each member as they shared.

## 2014-11-13 NOTE — [facility] Group Notes (Signed)
[place] LCSW Group Therapy 11/13/2014 11:00 AM  Type of Therapy and Topic: Group Therapy: Avoiding Self-Sabotaging and Enabling Behaviors   Pt attended but slept throughout group session.  Peri Maris, LCSWA 11/13/2014 11:00 AM

## 2014-11-13 NOTE — Progress Notes (Signed)
D) Pt rates her depression at a 0, her hopelessness at a 10 and her anxiety at a 0. Pt's affect is flat and mood depressed. Pt will sit in the dayroom and fall asleep, no matter the noise levels or what might be going on. Pt rarely smiles. States that she feels like she should talk with her family about her living situation although she feels that she does not want to put more on them. Pt denies SI and HI. A) Given support, provided with a 1:1 Given encouragement to talk with her family to share her situation and her concerns. Provided with praise. R) Pt states she is going to think about talking to her sister.

## 2014-11-13 NOTE — Progress Notes (Signed)
[facility] MD Progress Note  11/13/2014 10:22 AM Kendra Simmons  MRN:  [id number]  Subjective:   I feel tired and wondering if the medicine making me tired.  Objective:   Patient seen chart reviewed. Patient is known to this Probation officer from outpatient services. She is taking Zyprexa 15 mg and she sleeping better but she is feeling also tired.  She still have feelings of hopelessness and worthlessness however her suicidal thoughts are getting better. She is concerned about her living situation.  She is thinking that she may need to discuss with her sister about her long-term plan. She is going to the groups. She has no side effects other than she is feeling tired in the morning. She likes the Zyprexa.   Dx: MDD and   consider Panic Disorder      Total Time spent with patient: 30 minutes  ADL's: fair  Sleep: patient describes as poor but slightly improved-nursing staff report patient slept 6(+) hours  Appetite: fair   Suicidal Ideation:  Currently denies any suicidal ideations Homicidal Ideation:  Denies any  AEB (as evidenced by):  Psychiatric Specialty Exam: Physical Exam  Constitutional: She is oriented to person, place, and time. She appears well-developed and well-nourished.  HENT:  Head: Normocephalic and atraumatic.  Neck: Normal range of motion. Neck supple.  Musculoskeletal: Normal range of motion.  Neurological: She is alert and oriented to person, place, and time.  Skin: Skin is warm and dry.    Review of Systems  Constitutional: Positive for weight loss. Negative for fever and chills.  Eyes: Positive for pain. Negative for blurred vision, double vision, photophobia, discharge and redness.       Attributes this to poor sleep   Respiratory: Negative for cough and shortness of breath.   Cardiovascular: Negative for chest pain.  Gastrointestinal: Negative for nausea, vomiting, abdominal pain, diarrhea and constipation.  Genitourinary: Negative for dysuria, urgency and  frequency.  Musculoskeletal: Negative for myalgias and neck pain.  Skin: Negative for itching and rash.  Neurological: Positive for headaches. Negative for dizziness, tingling, seizures and loss of consciousness.  Psychiatric/Behavioral: Positive for depression. The patient has insomnia.     Blood pressure 104/60, pulse 123, temperature 98 F (36.7 C), temperature source Oral, resp. rate 18, height 5\' 4"  (1.626 m), weight 72.576 kg (160 lb).Body mass index is 27.45 kg/(m^2).  General Appearance: Fairly Groomed and  tiredd  Engineer, water:: good   Speech:  Slow  Volume:  Normal  Mood: depressed  Affect:  Constricted, but more reactive  Thought Process:  Goal Directed and Linear  Orientation:  Other:  fully alert and attentive  Thought Content:  denies hallucinations, does not appear internally preoccupied, no dellusions expressed  Suicidal Thoughts:  Yes.  without intent/plan  Homicidal Thoughts:  No  Memory:  Recent and remote grossly intact.  Judgement:  Fair  Insight:  Fair  Psychomotor Activity:  slowly improving, but still decreased   Concentration:  Good  Recall:  Good  Fund of Knowledge:Good  Language: Good  Akathisia:  Negative  Handed:  Right  AIMS (if indicated):     Assets:  Desire for Improvement Resilience  Sleep:  Number of Hours: 6.75   Musculoskeletal: Strength & Muscle Tone: within normal limits Gait & Station: normal Patient leans: N/A  Current Medications: Current Facility-Administered Medications  Medication Dose Route Frequency Provider Last Rate Last Dose  . acetaminophen (TYLENOL) tablet 650 mg  650 mg Oral Q6H PRN [name], MD  650 mg at 11/04/14 1653  . alum & mag hydroxide-simeth (MAALOX/MYLANTA) 200-200-20 MG/5ML suspension 30 mL  30 mL Oral Q4H PRN [name], MD      . buPROPion (WELLBUTRIN XL) 24 hr tablet 150 mg  150 mg Oral Daily Jenne Campus, MD   150 mg at 11/13/14 0998  . clonazePAM (KLONOPIN) tablet 1 mg  1 mg Oral QHS Jenne Campus, MD   1 mg at 11/12/14 2201  . feeding supplement (ENSURE COMPLETE) (ENSURE COMPLETE) liquid 237 mL  237 mL Oral TID BM Clayton Bibles, RD   237 mL at 11/07/14 2208  . magnesium hydroxide (MILK OF MAGNESIA) suspension 30 mL  30 mL Oral Daily PRN [name], MD      . mirtazapine (REMERON SOL-TAB) disintegrating tablet 45 mg  45 mg Oral QHS Jenne Campus, MD   45 mg at 11/12/14 2201  . OLANZapine zydis (ZYPREXA) disintegrating tablet 15 mg  15 mg Oral QHS Jenne Campus, MD   15 mg at 11/12/14 2201  . pantoprazole (PROTONIX) EC tablet 80 mg  80 mg Oral Daily [name], MD   80 mg at 11/13/14 3382  . polyvinyl alcohol (LIQUIFILM TEARS) 1.4 % ophthalmic solution 1 drop  1 drop Both Eyes PRN Jenne Campus, MD   1 drop at 11/12/14 1742  . polyvinyl alcohol (LIQUIFILM TEARS) 1.4 % ophthalmic solution 1 drop  1 drop Both Eyes PRN Encarnacion Slates, NP      . pravastatin (PRAVACHOL) tablet 20 mg  20 mg Oral q1800 [name], MD   20 mg at 11/12/14 1742  . sertraline (ZOLOFT) tablet 200 mg  200 mg Oral Daily Jenne Campus, MD   200 mg at 11/13/14 5053  . tamsulosin (FLOMAX) capsule 0.4 mg  0.4 mg Oral Daily [name], MD   0.4 mg at 11/13/14 9767    Lab Results:  No results found for this or any previous visit (from the past 48 hour(s)).  Physical Findings: AIMS: Facial and Oral Movements Muscles of Facial Expression: None, normal Lips and Perioral Area: None, normal Jaw: None, normal Tongue: None, normal,Extremity Movements Upper (arms, wrists, hands, fingers): None, normal Lower (legs, knees, ankles, toes): None, normal, Trunk Movements Neck, shoulders, hips: None, normal, Overall Severity Severity of abnormal movements (highest score from questions above): None, normal Incapacitation due to abnormal movements: None, normal Patient's awareness of abnormal movements (rate only patient's report): No Awareness, Dental Status Current problems with teeth and/or dentures?:  No Does patient usually wear dentures?: No  CIWA:  CIWA-Ar Total: 1 COWS:  COWS Total Score: 1   Assessment: Ongoing depression, sadness, negativism, tendency to ruminate about ocular pain. Sleep is improved and range of affect is slightly improved as well. Tolerating medications well. Denies side effects.  Discussed medication cause increase blood sugar. Her last employment A1c is slightly high. Upon discharge we will encourage her to follow-up with primary care physician for the management of high hemoglobin A1c. At this time she does not want to change her Zyprexa since it is helping her sleep and depression. Continue Zyprexa 15 mg at bedtime along with Zoloft and Remeron. However we'll reduce Klonopin 0.5 mg at bedtime to  Avoid feeling tired in the morning.  Treatment Plan Summary: Daily contact with patient to assess and evaluate symptoms and progress in treatment Medication management See below  Plan: Continue inpatient treatment and support  Continue Wellbutrin XL 150  mgrs QAM, Zoloft 200  mgrs daily  for depression, Zyprexa 15 mgrs QHS for sleep. Continue Remeron 45 mgrs QHS,  Decrease Klonopin 0.5 mg at bedtime. continue artificial tears to bilateral eyes prn. Discharge plans in progress.  Medical Decision Making Problem Points:  Established problem, stable/improving (1), Review of last therapy session (1) and Review of psycho-social stressors (1) Data Points:  Review of medication regiment & side effects (2) Review of new medications or change in dosage (2)  I certify that inpatient services furnished can reasonably be expected to improve the patient's condition.   Bryler Dibble T. , 11/13/2014, 10:22 AM

## 2014-11-14 MED ORDER — CLONAZEPAM 0.5 MG PO TABS
0.5000 mg | ORAL_TABLET | Freq: Every day | ORAL | Status: DC
  Administered 2014-11-14: 0.5 mg via ORAL
  Filled 2014-11-14: qty 1

## 2014-11-14 NOTE — [facility] Group Notes (Signed)
[place] LCSW Group Therapy  11/14/2014   10:00 AM   Type of Therapy:  Group Therapy  Participation Level:  Minimal  Participation Quality:  Minimal  Affect: Drowsy  Cognitive:  Drowsy  Insight:  Lacking  Engagement in Therapy:  Limited, Lacking  Modes of Intervention:  Clarification, Confrontation, Discussion, Education, Exploration, Limit-setting, Orientation, Problem-solving, Rapport Building, Art therapist, Socialization and Support  Summary of Progress/Problems: The main focus of today's process group was to identify the patient's current support system and decide on other supports that can be put in place.  An emphasis was placed on using counselor, doctor, therapy groups, 12-step groups, and problem-specific support groups to expand supports, as well as doing something different than has been done before.  Pt states that her eyes hurt which is why her eyes were closed.  Pt didn't contribute to group discussion but appeared to actively listen.     Regan Lemming, LCSW 11/14/2014 10:43 AM

## 2014-11-14 NOTE — Progress Notes (Signed)
D) Pt. Has been sitting in the dayroom all shift except for meals. Sits and sleeps. When group is occuring, Pt will wake up and pay attention, but does not really interact. In a 1:1, talked with Pt about speaking with her sister concerning the heater for her house. Pt stated last evening as this writer was leaving "I will not call her and stop pushing me". Today Pt states she will call her sister and talk with her. Did share that she feels scared and frightened that her sister might tell her no and then she would "really down and out". Pt states "yes, my sister loves me and I think she will help me". A) Given Pt encouragement to call and talk with her sister. Provided with a 1:1. Given reassurance. R) Pt sitting in the dayroom eating her dinner. Did state that she will be going home tomorrow so she could speak with her sister then, or she could speak with her mother who seems to be "more emotional about me".

## 2014-11-14 NOTE — Progress Notes (Signed)
[facility] MD Progress Note  11/14/2014 9:31 AM Kendra Simmons  MRN:  [id number]  Subjective:   I am sleeping better.  But I still feel anxious some time.    Objective:   Patient seen chart reviewed.  Patient continued to improve and reported much sleep with the medication.  However she still feel anxious and nervous about her discharge.  She is not sure if her sister will take her back.  She is taking Zyprexa 15 mg at bedtime.  She reported puffiness in her eyes however no other concerns or complaints.  She still feels sometimes hopeless and worthless but less intense from the past.  She is going to the groups.  Her energy level is okay.  She has no tremors or shakes.     Dx: MDD and   consider Panic Disorder      Total Time spent with patient: 30 minutes  ADL's: fair  Sleep: patient describes as poor but slightly improved-nursing staff report patient slept 6(+) hours  Appetite: fair   Suicidal Ideation:  Currently denies any suicidal ideations Homicidal Ideation:  Denies any  AEB (as evidenced by):  Psychiatric Specialty Exam: Physical Exam  Constitutional: She is oriented to person, place, and time. She appears well-developed and well-nourished.  HENT:  Head: Normocephalic and atraumatic.  Neck: Normal range of motion. Neck supple.  Musculoskeletal: Normal range of motion.  Neurological: She is alert and oriented to person, place, and time.  Skin: Skin is warm and dry.    Review of Systems  Constitutional: Negative for fever and chills.  Eyes: Negative for blurred vision, double vision, photophobia, discharge and redness.       Attributes this to poor sleep   Respiratory: Negative for cough and shortness of breath.   Cardiovascular: Negative for chest pain.  Gastrointestinal: Negative for nausea, vomiting, abdominal pain, diarrhea and constipation.  Genitourinary: Negative for dysuria, urgency and frequency.  Musculoskeletal: Negative for myalgias and neck pain.  Skin:  Negative for itching and rash.  Neurological: Negative for dizziness, tingling, seizures and loss of consciousness.  Psychiatric/Behavioral: Positive for depression. The patient has insomnia.     Blood pressure 116/58, pulse 78, temperature 97.8 F (36.6 C), temperature source Oral, resp. rate 18, height 5\' 4"  (1.626 m), weight 72.576 kg (160 lb).Body mass index is 27.45 kg/(m^2).  General Appearance: Fairly Groomed and  tiredd  Engineer, water:: good   Speech:  Slow  Volume:  Normal  Mood: depressed  Affect:  Constricted, but more reactive  Thought Process:  Goal Directed and Linear  Orientation:  Other:  fully alert and attentive  Thought Content:  denies hallucinations, does not appear internally preoccupied, no dellusions expressed  Suicidal Thoughts:  Yes.  without intent/plan  Homicidal Thoughts:  No  Memory:  Recent and remote grossly intact.  Judgement:  Fair  Insight:  Fair  Psychomotor Activity:  slowly improving, but still decreased   Concentration:  Good  Recall:  Good  Fund of Knowledge:Good  Language: Good  Akathisia:  Negative  Handed:  Right  AIMS (if indicated):     Assets:  Desire for Improvement Resilience  Sleep:  Number of Hours: 6.5   Musculoskeletal: Strength & Muscle Tone: within normal limits Gait & Station: normal Patient leans: N/A  Current Medications: Current Facility-Administered Medications  Medication Dose Route Frequency Provider Last Rate Last Dose  . acetaminophen (TYLENOL) tablet 650 mg  650 mg Oral Q6H PRN [name], MD   650 mg  at 11/04/14 1653  . alum & mag hydroxide-simeth (MAALOX/MYLANTA) 200-200-20 MG/5ML suspension 30 mL  30 mL Oral Q4H PRN [name], MD      . buPROPion (WELLBUTRIN XL) 24 hr tablet 150 mg  150 mg Oral Daily Myer Peer Cobos, MD   150 mg at 11/14/14 0900  . clonazePAM (KLONOPIN) tablet 1 mg  1 mg Oral QHS Jenne Campus, MD   1 mg at 11/13/14 2159  . feeding supplement (ENSURE COMPLETE) (ENSURE COMPLETE)  liquid 237 mL  237 mL Oral TID BM Clayton Bibles, RD   237 mL at 11/13/14 1000  . magnesium hydroxide (MILK OF MAGNESIA) suspension 30 mL  30 mL Oral Daily PRN [name], MD      . mirtazapine (REMERON SOL-TAB) disintegrating tablet 45 mg  45 mg Oral QHS Jenne Campus, MD   45 mg at 11/13/14 2159  . OLANZapine zydis (ZYPREXA) disintegrating tablet 15 mg  15 mg Oral QHS Jenne Campus, MD   15 mg at 11/13/14 2159  . pantoprazole (PROTONIX) EC tablet 80 mg  80 mg Oral Daily [name], MD   80 mg at 11/13/14 8469  . polyvinyl alcohol (LIQUIFILM TEARS) 1.4 % ophthalmic solution 1 drop  1 drop Both Eyes PRN Jenne Campus, MD   1 drop at 11/12/14 1742  . polyvinyl alcohol (LIQUIFILM TEARS) 1.4 % ophthalmic solution 1 drop  1 drop Both Eyes PRN Encarnacion Slates, NP      . pravastatin (PRAVACHOL) tablet 20 mg  20 mg Oral q1800 [name], MD   20 mg at 11/12/14 1742  . sertraline (ZOLOFT) tablet 200 mg  200 mg Oral Daily Myer Peer Cobos, MD   200 mg at 11/14/14 0900  . tamsulosin (FLOMAX) capsule 0.4 mg  0.4 mg Oral Daily [name], MD   0.4 mg at 11/14/14 6295    Lab Results:  No results found for this or any previous visit (from the past 48 hour(s)).  Physical Findings: AIMS: Facial and Oral Movements Muscles of Facial Expression: None, normal Lips and Perioral Area: None, normal Jaw: None, normal Tongue: None, normal,Extremity Movements Upper (arms, wrists, hands, fingers): None, normal Lower (legs, knees, ankles, toes): None, normal, Trunk Movements Neck, shoulders, hips: None, normal, Overall Severity Severity of abnormal movements (highest score from questions above): None, normal Incapacitation due to abnormal movements: None, normal Patient's awareness of abnormal movements (rate only patient's report): No Awareness, Dental Status Current problems with teeth and/or dentures?: No Does patient usually wear dentures?: No  CIWA:  CIWA-Ar Total: 1 COWS:  COWS Total Score:  1   Assessment: Ongoing depression, sadness, negativism, tendency to ruminate about ocular pain. Sleep is improved and range of affect is slightly improved as well. Tolerating medications well. Denies side effects.  Discussed medication cause increase blood sugar. Her last employment A1c is slightly high. Upon discharge we will encourage her to follow-up with primary care physician for the management of high hemoglobin A1c. At this time she does not want to change her Zyprexa since it is helping her sleep and depression. Continue Zyprexa 15 mg at bedtime along with Zoloft and Remeron. However we'll reduce Klonopin 0.5 mg at bedtime to  Avoid feeling tired in the morning.  Treatment Plan Summary: Daily contact with patient to assess and evaluate symptoms and progress in treatment Medication management See below  Plan: Encouraged to talk to her sister about disposition plan as patient is  not sure if sister will take her back.  I will continue Wellbutrin XL 150 mgrs QAM, Zoloft 200  mgrs daily  for depression, Zyprexa 15 mgrs QHS for sleep. Continue Remeron 45 mgrs QHS,  continue Klonopin 0.5 mg at bedtime. continue artificial tears to bilateral eyes prn. Discharge plans in progress.  Medical Decision Making Problem Points:  Established problem, stable/improving (1), Review of last therapy session (1) and Review of psycho-social stressors (1) Data Points:  Review of medication regiment & side effects (2) Review of new medications or change in dosage (2)  I certify that inpatient services furnished can reasonably be expected to improve the patient's condition.   Evanell Redlich T. , 11/14/2014, 9:31 AM

## 2014-11-14 NOTE — [facility] Group Notes (Signed)
The focus of this group is to educate the patient on the purpose and policies of crisis stabilization and provide a format to answer questions about their admission.  The group details unit policies and expectations of patients while admitted.  Patient attended 0900 nurse education orientation group this morning.  Patient listened to group leader, appropriate affect, alert, appropriate insight.  Patient stated her main goal today was to take a tub bath.

## 2014-11-15 DIAGNOSIS — F4310 Post-traumatic stress disorder, unspecified: Secondary | ICD-10-CM

## 2014-11-15 MED ORDER — TAMSULOSIN HCL 0.4 MG PO CAPS: 0.4000 mg | ORAL_CAPSULE | Freq: Every day | ORAL | Status: DC

## 2014-11-15 MED ORDER — SERTRALINE HCL 100 MG PO TABS: 200.0000 mg | ORAL_TABLET | Freq: Every day | ORAL | Status: DC

## 2014-11-15 MED ORDER — LOPERAMIDE HCL 2 MG PO CAPS
4.0000 mg | ORAL_CAPSULE | Freq: Once | ORAL | Status: AC
  Administered 2014-11-15: 4 mg via ORAL

## 2014-11-15 MED ORDER — MIRTAZAPINE 45 MG PO TBDP: 45.0000 mg | ORAL_TABLET | Freq: Every day | ORAL | Status: DC

## 2014-11-15 MED ORDER — BUPROPION HCL ER (XL) 150 MG PO TB24: 150.0000 mg | ORAL_TABLET | Freq: Every day | ORAL | Status: DC

## 2014-11-15 MED ORDER — OLANZAPINE 15 MG PO TBDP: 15.0000 mg | ORAL_TABLET | Freq: Every day | ORAL | Status: DC

## 2014-11-15 MED ORDER — OMEPRAZOLE 20 MG PO CPDR: 20.0000 mg | DELAYED_RELEASE_CAPSULE | Freq: Every day | ORAL | Status: DC

## 2014-11-15 MED ORDER — OLANZAPINE 5 MG PO TBDP
15.0000 mg | ORAL_TABLET | Freq: Every day | ORAL | Status: DC
  Filled 2014-11-15: qty 12

## 2014-11-15 MED ORDER — PRAVASTATIN SODIUM 20 MG PO TABS: 20.0000 mg | ORAL_TABLET | Freq: Every day | ORAL | Status: DC

## 2014-11-15 MED ORDER — VITAMIN D (ERGOCALCIFEROL) 1.25 MG (50000 UNIT) PO CAPS: 50000.0000 [IU] | ORAL_CAPSULE | ORAL | Status: DC

## 2014-11-15 MED ORDER — CLONAZEPAM 0.5 MG PO TABS: 0.5000 mg | ORAL_TABLET | Freq: Every day | ORAL | Status: DC

## 2014-11-15 MED ORDER — POLYVINYL ALCOHOL 1.4 % OP SOLN: 1.0000 [drp] | OPHTHALMIC | Status: DC | PRN

## 2014-11-15 NOTE — Progress Notes (Signed)
Patient ID: Kendra Simmons, female   DOB: May 12, 1951, 63 y.o.   MRN: [id number] PER STATE REGULATIONS 482.30  THIS CHART WAS REVIEWED FOR MEDICAL NECESSITY WITH RESPECT TO THE PATIENT'S ADMISSION/ DURATION OF STAY.  NEXT REVIEW DATE: 11/18/2014  Chauncy Lean, RN, BSN CASE MANAGER

## 2014-11-15 NOTE — Discharge Summary (Signed)
Physician Discharge Summary Note  Patient:  Kendra Simmons is an 63 y.o., female MRN:  [id number] DOB:  Apr 13, 1951 Patient phone:  [phone] (home)  Patient address:   [address] [place] [address],  Total Time spent with patient: Greater than 30 minutes  Date of Admission:  11/02/2014 Date of Discharge: 11/15/14  Reason for Admission: Mood stabilization treatment  Discharge Diagnoses: Active Problems:   PTSD (post-traumatic stress disorder)   Major depressive disorder, recurrent, severe without psychotic features   Psychiatric Specialty Exam: Physical Exam  Psychiatric: Her speech is normal and behavior is normal. Judgment and thought content normal. Her mood appears not anxious. Her affect is not angry, not blunt, not labile and not inappropriate. Cognition and memory are normal. She does not exhibit a depressed mood.    Review of Systems  Constitutional: Negative.   HENT: Negative.   Eyes: Negative.   Respiratory: Negative.   Cardiovascular: Negative.   Gastrointestinal: Negative.   Genitourinary: Negative.   Musculoskeletal: Negative.   Skin: Negative.   Neurological: Negative.   Endo/Heme/Allergies: Negative.   Psychiatric/Behavioral: Positive for depression (Stable). Negative for suicidal ideas, hallucinations, memory loss and substance abuse. The patient has insomnia (Stable). The patient is not nervous/anxious.     Blood pressure 129/67, pulse 78, temperature 97.8 F (36.6 C), temperature source Oral, resp. rate 18, height 5\' 4"  (1.626 m), weight 72.576 kg (160 lb).Body mass index is 27.45 kg/(m^2).  See md's SAR                                                 Past Psychiatric History: Diagnosis: Has been diagnosed with MDD, and has had Panic Attacks, although these have improved. Insomnia is a chronic issue for her. Denies any history of mania, denies history of psychosis, and at this time is not endorsing any history of  PTSD.  Hospitalizations: Has a history of prior psychiatric admissions, most recently was here at [facility] from 7/26-8/10/15.   Outpatient Care: With Dr. Adele Schilder at [facility]  Substance Abuse Care: Denies substance abuse   Self-Mutilation: Denies   Suicidal Attempts: Denies   Violent Behaviors: Denies    Musculoskeletal: Strength & Muscle Tone: within normal limits Gait & Station: normal Patient leans: N/A  DSM5: Schizophrenia Disorders:  NA Obsessive-Compulsive Disorders:   Trauma-Stressor Disorders:  Posttraumatic Stress Disorder (309.81) Substance/Addictive Disorders:  NA Depressive Disorders:  Major depressive disorder, recurrent, severe without psychotic features  Axis Diagnosis:  AXIS I:  Major depressive disorder, recurrent, severe without psychotic features, PTSD AXIS II:  Deferred AXIS III:   Past Medical History  Diagnosis Date  . High cholesterol   . Depression   . Anxiety   . GERD (gastroesophageal reflux disease)   . Insomnia   . Arthritis    AXIS IV:  other psychosocial or environmental problems and mental illness, chronic AXIS V:  63  Level of Care:  OP  Hospital Course:  63 year old female, presents to the ED with her family ( sister). She reports a history of worsening insomnia, in spite of medications. She has only been sleeping 1-2 hours a night. She has become increasingly depressed, and has developed thoughts of suicide, although at this time denies any suicidal plan or intention. As per notes, her outpatient psychiatrist, Dr. Adele Schilder, has referred her for a sleep study, but it has  not been scheduled yet. Of note, patient has had several recent visits to ED for insomnia.  After admission assessment/evaluation, Stanisha was started on Zyprexa Zydis 15 mg integrating tables Q bedtime for mood control and to aid her sleep. She was also medicated and discharged on Mirtazapine 45 mg for depression, Wellbutrin XL 150 mg daily for depression, Sertraline 200 mg daily for  depression and Klonopin 0.5 mg Q bedtime for insomnia/anxiety. Allanna was also enrolled and actively participated in the group counseling sessions being offered and held on this unit. She learned coping skills. She was resumed on her pertinent home medications for her other medical issues that she presented. She tolerated her treatment regimen without any adverse effects and or reactions.  Kadejah's symptoms responded well to her treatment regimen. This is evidenced by her reports of improved sleep. Masae maintained throughout her hospital stay that her problem is not depression but insomnia. She maintained that she is sleeping well here, and if she can maintained the sleep pattern that she had while inpatient in this hospital that she will not have any more complaints. She is currently being discharged to continue further psychiatric care and routine medication management on outpatient basis with Dr. Adele Schilder at the [facility] clinic. Sharyah was provided with all the pertinent  information required to make this appointment without problems.   Upon discharge, she adamantly denies any SIHI, AVH, delusional thoughts and or paranoia. She is provided with a 4 days worth, supply samples of her [facility] discharge medications. She left [facility] with all personal belongings in no apparent distress. Transportation per family.  Consults:  psychiatry  Significant Diagnostic Studies:  labs: CBC with diff, CMP, UDS, toxicology tests, U/A, results reviewed, no changes  Discharge Vitals:   Blood pressure 129/67, pulse 78, temperature 97.8 F (36.6 C), temperature source Oral, resp. rate 18, height 5\' 4"  (1.626 m), weight 72.576 kg (160 lb). Body mass index is 27.45 kg/(m^2). Lab Results:   No results found for this or any previous visit (from the past 72 hour(s)).  Physical Findings: AIMS: Facial and Oral Movements Muscles of Facial Expression: None, normal Lips and Perioral Area: None, normal Jaw: None,  normal Tongue: None, normal,Extremity Movements Upper (arms, wrists, hands, fingers): None, normal Lower (legs, knees, ankles, toes): None, normal, Trunk Movements Neck, shoulders, hips: None, normal, Overall Severity Severity of abnormal movements (highest score from questions above): None, normal Incapacitation due to abnormal movements: None, normal Patient's awareness of abnormal movements (rate only patient's report): No Awareness, Dental Status Current problems with teeth and/or dentures?: No Does patient usually wear dentures?: No  CIWA:  CIWA-Ar Total: 1 COWS:  COWS Total Score: 1  Psychiatric Specialty Exam: See Psychiatric Specialty Exam and Suicide Risk Assessment completed by Attending Physician prior to discharge.  Discharge destination:  Home  Is patient on multiple antipsychotic therapies at discharge:  No   Has Patient had three or more failed trials of antipsychotic monotherapy by history:  No  Recommended Plan for Multiple Antipsychotic Therapies: NA    Medication List    STOP taking these medications        mirtazapine 30 MG tablet  Commonly known as:  REMERON  Replaced by:  mirtazapine 45 MG disintegrating tablet     pantoprazole 20 MG tablet  Commonly known as:  PROTONIX     POTASSIUM PO     QUEtiapine 100 MG tablet  Commonly known as:  SEROQUEL     VITAMIN B-12 IJ  TAKE these medications      Indication   buPROPion 150 MG 24 hr tablet  Commonly known as:  WELLBUTRIN XL  Take 1 tablet (150 mg total) by mouth daily. For depression   Indication:  Major Depressive Disorder     clonazePAM 0.5 MG tablet  Commonly known as:  KLONOPIN  Take 1 tablet (0.5 mg total) by mouth at bedtime. For anxiety/insomnia   Indication:  Anxiety/insomnia     mirtazapine 45 MG disintegrating tablet  Commonly known as:  REMERON SOL-TAB  Take 1 tablet (45 mg total) by mouth at bedtime. For depression   Indication:  Major Depressive Disorder     olanzapine  zydis 15 MG disintegrating tablet  Commonly known as:  ZYPREXA  Take 1 tablet (15 mg total) by mouth at bedtime. For mood control/insomnia   Indication:  Trouble Sleeping, Mood control     omeprazole 20 MG capsule  Commonly known as:  PRILOSEC  Take 1 capsule (20 mg total) by mouth daily. For acid reflux   Indication:  Gastroesophageal Reflux Disease     polyvinyl alcohol 1.4 % ophthalmic solution  Commonly known as:  LIQUIFILM TEARS  Place 1 drop into both eyes as needed for dry eyes.   Indication:  Irritation of the Eye     pravastatin 20 MG tablet  Commonly known as:  PRAVACHOL  Take 1 tablet (20 mg total) by mouth at bedtime. For high cholesterol   Indication:  Inherited Heterozygous Hypercholesterolemia     sertraline 100 MG tablet  Commonly known as:  ZOLOFT  Take 2 tablets (200 mg total) by mouth daily. For depression   Indication:  Major Depressive Disorder     tamsulosin 0.4 MG Caps capsule  Commonly known as:  FLOMAX  Take 1 capsule (0.4 mg total) by mouth daily. For urinary retension   Indication:  Urinary retension     Vitamin D (Ergocalciferol) 50000 UNITS Caps capsule  Commonly known as:  DRISDOL  Take 1 capsule (50,000 Units total) by mouth every 7 (seven) days. On Wednesday: For bone health   Indication:  Bone health       Follow-up Information    Follow up with Dr. Adele Schilder - [facility] On 11/25/2014.   Why:  You are scheduled with Dr. Adele Schilder on Thursday, November 25, 2014 at 11:00 AM   Contact information:   [address] [place], [place]   [address]  [phone]     Follow-up recommendations: Activity:  As tolerated Diet: As recommended by your primary care doctor. Keep all scheduled follow-up appointments as recommended.    Comments:  Take all your medications as prescribed by your mental healthcare provider. Report any adverse effects and or reactions from your medicines to your outpatient provider promptly. Patient is instructed  and cautioned to not engage in alcohol and or illegal drug use while on prescription medicines. In the event of worsening symptoms, patient is instructed to call the crisis hotline, 911 and or go to the nearest ED for appropriate evaluation and treatment of symptoms. Follow-up with your primary care provider for your other medical issues, concerns and or health care needs.   Total Discharge Time:  Greater than 30 minutes.  SignedEncarnacion Slates, PMHNP-BC 11/15/2014, 12:04 PM   Patient seen, Suicide Assessment Completed.  Disposition Plan Reviewed

## 2014-11-15 NOTE — BHH Group Notes (Signed)
[facility] LCSW Aftercare Discharge Planning Group Note  11/15/2014  8:45 AM  Participation Quality: Did Not Attend- patient in bed.  Tilden Fossa, MSW, [place] Worker [facility] [phone]

## 2014-11-15 NOTE — BHH Suicide Risk Assessment (Signed)
Demographic Factors:  63 year old female, divorced, has adult children, currently retired  Total Time spent with patient: 30 minutes  Psychiatric Specialty Exam: Physical Exam  ROS  Blood pressure 129/67, pulse 78, temperature 97.8 F (36.6 C), temperature source Oral, resp. rate 18, height '5\' 4"'  (1.626 m), weight 72.576 kg (160 lb).Body mass index is 27.45 kg/(m^2).  General Appearance: improved grooming  Eye Contact::  Good  Speech:  Normal Rate  Volume:  Decreased  Mood:  still depressed but has inmproved compared to admission  Affect:  constricted but reactive, does smile at times appropriately  Thought Process:  Goal Directed and Linear  Orientation:  Full (Time, Place, and Person)  Thought Content:  denies hallucinations, no delusions, less ruminative  Suicidal Thoughts:  No patient denies any thoughts of hurting herself   Homicidal Thoughts:  No  Memory:Recent and Remote grossly intact  Judgement:  Other:  improved  Insight:  Fair  Psychomotor Activity:  Normal  Concentration:  Good  Recall:  Good  Fund of Knowledge:Good  Language: Good  Akathisia:  Negative  Handed:  Right  AIMS (if indicated):     Assets:  Desire for Improvement Resilience Social Support  Sleep:  Number of Hours: 6.75    Musculoskeletal: Strength & Muscle Tone: within normal limits Gait & Station: normal Patient leans: N/A   Mental Status Per Nursing Assessment::   On Admission:  NA  Current Mental Status by Physician: I have met with patient and have discussed case with treatment team- patient is partially improved compared to admission. She does remain depressed, but staff reports a partially improved range of affect, and patient smiles at times appropriately. She is not thought disordered. She is less ruminative about insomnia and about her eyes. She denies any suicidal ideations, she denies any homicidal ideations, no psychotic symptoms. Plans to return to her sister's.  Loss  Factors: Not working, limited support network,  Historical Factors: History of depression, history of prior psychiatric admissions, no prior history of suicidal attempts , no history of violence   Risk Reduction Factors:   Sense of responsibility to family, Living with another person, especially a relative and Positive coping skills or problem solving skills  Continued Clinical Symptoms:  Patient remains depressed, but has improved compared to admission status. As noted, is less ruminative, is less focused on insomnia and is sleeping better. She denies any SI.  Cognitive Features That Contribute To Risk:  No gross cognitive deficits noted upon discharge. Is alert , attentive, and oriented x 3   Suicide Risk:  Mild:  Suicidal ideation of limited frequency, intensity, duration, and specificity.  There are no identifiable plans, no associated intent, mild dysphoria and related symptoms, good self-control (both objective and subjective assessment), few other risk factors, and identifiable protective factors, including available and accessible social support.  Discharge Diagnoses:   AXIS I:  Major Depression, Severe, no Psychotic Features, Insomnia NOS. AXIS II:  Deferred AXIS III:   Past Medical History  Diagnosis Date  . High cholesterol   . Depression   . Anxiety   . GERD (gastroesophageal reflux disease)   . Insomnia   . Arthritis    AXIS IV:  occupational problems and problems related to social environment AXIS V:  51-60 moderate symptoms  Plan Of Care/Follow-up recommendations:  Activity:  As tolerated Diet:  Heart Health, low cholesterol Tests:  NA Other:  See below  Is patient on multiple antipsychotic therapies at discharge:  No  Has Patient had three or more failed trials of antipsychotic monotherapy by history:  No  Recommended Plan for Multiple Antipsychotic Therapies: NA  Patient is retuning home. Plans to follow up with Dr. Adele Schilder at [facility] for ongoing  psychiatric management Plans to follow up with her PCP at [place] group for medical issues as needed Encouraged to follow up with having outpatient sleep study done. Patient agrees to return to ED if any SI emerge or if any significant worsening.   Kendra Simmons, [place] 11/15/2014, 12:14 PM

## 2014-11-15 NOTE — Progress Notes (Signed)
Patient ID: Kendra Simmons, female   DOB: 1951/06/20, 63 y.o.   MRN: [id number] D- Patient alert and oriented.  Patient presents as flat and sad.  Denies SI, HI, and AVH.  Patient complains of trouble with her eyes around her eyelids and states it's "very annoying and uncomfortable". Patient denied pain medications and eye drops prescribed stating "they don't work". Patient rates hopelessness a 10/10. A- Support and encouragement provided.  Routine safety checks conducted every 15 minutes.  Patient informed to notify staff with any further problems or concerns. R- Patient verbally contracts for safety. Safety maintained on the unit.

## 2014-11-15 NOTE — Progress Notes (Signed)
Pt was discharged home today.  She denied any S/I H/I or A/V hallucinations. She was given f/u appointment, rx, sample medications, and hotline info booklet. She voiced understanding to all instructions provided. She declined the need for smoking cessation materials.  He removed his nicotine patch before he left.

## 2014-11-15 NOTE — Progress Notes (Signed)
[facility] Adult Case Management Discharge Plan :  Will you be returning to the same living situation after discharge: Yes,  patient will return home with her sister At discharge, do you have transportation home?:Yes,  patient reports access to transportation through family Do you have the ability to pay for your medications:Yes,  patient will be provided with prescriptions at discharge  Release of information consent forms completed and in the chart;  Patient's signature needed at discharge.  Patient to Follow up at: Follow-up Information    Follow up with Dr. Adele Schilder - [facility] On 11/25/2014.   Why:  You are scheduled with Dr. Adele Schilder on Thursday, November 25, 2014 at 11:00 AM   Contact information:   [address] [place], [place]   [address]  [phone]      Patient denies SI/HI:   Yes,  denies    Safety Planning and Suicide Prevention discussed:  Yes,  with patient and sister  Richardo Priest 11/15/2014, 12:08 PM

## 2014-11-24 ENCOUNTER — Emergency Department (HOSPITAL_COMMUNITY): Payer: Medicare Other

## 2014-11-24 ENCOUNTER — Emergency Department (HOSPITAL_COMMUNITY)
Admission: EM | Admit: 2014-11-24 | Discharge: 2014-11-24 | Disposition: A | Discharge status: Home / Self Care | Payer: Medicare Other | Attending: Emergency Medicine | Admitting: Emergency Medicine

## 2014-11-24 ENCOUNTER — Encounter (HOSPITAL_COMMUNITY): Payer: Self-pay

## 2014-11-24 DIAGNOSIS — R0789 Other chest pain: Secondary | ICD-10-CM | POA: Diagnosis not present

## 2014-11-24 DIAGNOSIS — Z79899 Other long term (current) drug therapy: Secondary | ICD-10-CM | POA: Diagnosis not present

## 2014-11-24 DIAGNOSIS — F329 Major depressive disorder, single episode, unspecified: Secondary | ICD-10-CM | POA: Insufficient documentation

## 2014-11-24 DIAGNOSIS — K219 Gastro-esophageal reflux disease without esophagitis: Secondary | ICD-10-CM | POA: Diagnosis not present

## 2014-11-24 DIAGNOSIS — Z8739 Personal history of other diseases of the musculoskeletal system and connective tissue: Secondary | ICD-10-CM | POA: Diagnosis not present

## 2014-11-24 DIAGNOSIS — F419 Anxiety disorder, unspecified: Secondary | ICD-10-CM | POA: Insufficient documentation

## 2014-11-24 DIAGNOSIS — R079 Chest pain, unspecified: Secondary | ICD-10-CM | POA: Diagnosis not present

## 2014-11-24 DIAGNOSIS — E782 Mixed hyperlipidemia: Secondary | ICD-10-CM | POA: Diagnosis not present

## 2014-11-24 LAB — COMPREHENSIVE METABOLIC PANEL
ALT: 21 U/L (ref 0–35)
AST: 22 U/L (ref 0–37)
Albumin: 4.1 g/dL (ref 3.5–5.2)
Alkaline Phosphatase: 70 U/L (ref 39–117)
Anion gap: 15 (ref 5–15)
BUN: 11 mg/dL (ref 6–23)
CO2: 22 mEq/L (ref 19–32)
Calcium: 9.7 mg/dL (ref 8.4–10.5)
Chloride: 102 mEq/L (ref 96–112)
Creatinine, Ser: 0.76 mg/dL (ref 0.50–1.10)
GFR calc Af Amer: >90 mL/min (ref >90)
GFR calc non Af Amer: 88 mL/min — ABNORMAL LOW (ref >90)
Glucose, Bld: 126 mg/dL — ABNORMAL HIGH (ref 70–99)
Potassium: 4 mEq/L (ref 3.7–5.3)
Sodium: 139 mEq/L (ref 137–147)
Total Bilirubin: 0.3 mg/dL (ref 0.3–1.2)
Total Protein: 7.1 g/dL (ref 6.0–8.3)

## 2014-11-24 LAB — I-STAT TROPONIN, ED: Troponin i, poc: 0 ng/mL (ref 0.00–0.08)

## 2014-11-24 LAB — CBC
HCT: 38.8 % (ref 36.0–46.0)
Hemoglobin: 12.6 g/dL (ref 12.0–15.0)
MCH: 29 pg (ref 26.0–34.0)
MCHC: 32.5 g/dL (ref 30.0–36.0)
MCV: 89.2 fL (ref 78.0–100.0)
Platelets: 135 10*3/uL — ABNORMAL LOW (ref 150–400)
RBC: 4.35 MIL/uL (ref 3.87–5.11)
RDW: 13 % (ref 11.5–15.5)
WBC: 4.8 10*3/uL (ref 4.0–10.5)

## 2014-11-24 NOTE — ED Provider Notes (Signed)
CSN: [id number]     Arrival date & time 11/24/14  1444 History   First MD Initiated Contact with Patient 11/24/14 1539     Chief Complaint  Patient presents with  . Chest Pain     (Consider location/radiation/quality/duration/timing/severity/associated sxs/prior Treatment) HPI  The patient has had intermittent chest pain for 2 weeks. She reports it comes and goes and may last for a few minutes or a few hours. She reports is right under her sternum. There are no associated symptoms. It is not exacerbated or relieved by anything. She reports she's trying antacids but that doesn't seem to be working. She is not having any chest pain at this time.  Past Medical History  Diagnosis Date  . High cholesterol   . Depression   . Anxiety   . GERD (gastroesophageal reflux disease)   . Insomnia   . Arthritis    Past Surgical History  Procedure Laterality Date  . Cosmetic surgery    . Cesarean section    . Laproscopy     Family History  Problem Relation Age of Onset  . Sleep apnea Father   . Alcohol abuse Father   . Diabetes Mother   . Diabetes Sister   . Diabetes Maternal Uncle   . Diabetes Cousin    History  Substance Use Topics  . Smoking status: Never Smoker   . Smokeless tobacco: Never Used  . Alcohol Use: No   OB History    No data available     Review of Systems  10 Systems reviewed and are negative for acute change except as noted in the HPI.   Allergies  Review of patient's allergies indicates no known allergies.  Home Medications   Prior to Admission medications   Medication Sig Start Date End Date Taking? Authorizing Provider  buPROPion (WELLBUTRIN XL) 150 MG 24 hr tablet Take 1 tablet (150 mg total) by mouth daily. For depression 11/15/14  Yes Encarnacion Slates, NP  clonazePAM (KLONOPIN) 0.5 MG tablet Take 1 tablet (0.5 mg total) by mouth at bedtime. For anxiety/insomnia 11/15/14  Yes Encarnacion Slates, NP  Cyanocobalamin 1000 MCG SUBL Place 1,000 mcg under the  tongue once a week.   Yes Historical Provider, MD  Ibuprofen-Diphenhydramine Cit (ADVIL PM PO) Take 0.5 tablets by mouth as needed (pain).   Yes Historical Provider, MD  mirtazapine (REMERON SOL-TAB) 45 MG disintegrating tablet Take 1 tablet (45 mg total) by mouth at bedtime. For depression 11/15/14  Yes Encarnacion Slates, NP  OLANZapine zydis (ZYPREXA) 15 MG disintegrating tablet Take 1 tablet (15 mg total) by mouth at bedtime. For mood control/insomnia 11/15/14  Yes Encarnacion Slates, NP  omeprazole (PRILOSEC) 20 MG capsule Take 1 capsule (20 mg total) by mouth daily. For acid reflux 11/15/14  Yes Encarnacion Slates, NP  OVER THE COUNTER MEDICATION Take 1 tablet by mouth daily. Potasium   Yes Historical Provider, MD  polyvinyl alcohol (LIQUIFILM TEARS) 1.4 % ophthalmic solution Place 1 drop into both eyes as needed for dry eyes. 11/15/14  Yes Encarnacion Slates, NP  pravastatin (PRAVACHOL) 20 MG tablet Take 1 tablet (20 mg total) by mouth at bedtime. For high cholesterol 11/15/14  Yes Encarnacion Slates, NP  sertraline (ZOLOFT) 100 MG tablet Take 2 tablets (200 mg total) by mouth daily. For depression 11/15/14  Yes Encarnacion Slates, NP  tamsulosin (FLOMAX) 0.4 MG CAPS capsule Take 1 capsule (0.4 mg total) by mouth daily. For urinary retension 11/15/14  Yes Encarnacion Slates, NP  Vitamin D, Ergocalciferol, (DRISDOL) 50000 UNITS CAPS capsule Take 1 capsule (50,000 Units total) by mouth every 7 (seven) days. On Wednesday: For bone health 11/15/14  Yes Encarnacion Slates, NP   BP 108/73 mmHg  Pulse 125  Temp(Src) 98.2 F (36.8 C) (Oral)  Resp 18  SpO2 96% Physical Exam  Constitutional: She is oriented to person, place, and time. She appears well-developed and well-nourished.  HENT:  Head: Normocephalic and atraumatic.  Eyes: EOM are normal. Pupils are equal, round, and reactive to light.  Neck: Neck supple.  Cardiovascular: Normal rate, regular rhythm, normal heart sounds and intact distal pulses.   Pulmonary/Chest: Effort  normal and breath sounds normal.  Abdominal: Soft. Bowel sounds are normal. She exhibits no distension. There is no tenderness.  Musculoskeletal: Normal range of motion. She exhibits no edema.  Neurological: She is alert and oriented to person, place, and time. She has normal strength. Coordination normal. GCS eye subscore is 4. GCS verbal subscore is 5. GCS motor subscore is 6.  Skin: Skin is warm, dry and intact.  Psychiatric: She has a normal mood and affect.    ED Course  Procedures (including critical care time) Labs Review Labs Reviewed  CBC - Abnormal; Notable for the following:    Platelets 135 (*)    All other components within normal limits  COMPREHENSIVE METABOLIC PANEL - Abnormal; Notable for the following:    Glucose, Bld 126 (*)    GFR calc non Af Amer 88 (*)    All other components within normal limits  Randolm Idol, ED    Imaging Review Dg Chest 2 View  11/24/2014   CLINICAL DATA:  63 year old female with intermittent chest pain at night. Initial encounter.  EXAM: CHEST  2 VIEW  COMPARISON:  10/13/2014 and earlier  FINDINGS: Upright AP and lateral views of the chest. Normal cardiac size and mediastinal contours. Visualized tracheal air column is within normal limits. Mildly increased interstitial markings are stable, and otherwise the lungs are clear. No pneumothorax or effusion. No acute osseous abnormality identified.  IMPRESSION: No acute cardiopulmonary abnormality.   Electronically Signed   By: Lars Pinks M.D.   On: 11/24/2014 17:07     EKG Interpretation None      MDM   Final diagnoses:  Atypical chest pain   At this time patient's quality of pain and duration does not sound suspicious for cardiac or pulmonary. It comes and goes without any associated symptoms. At this time I do feel patient is safe for continued outpatient workup with her family physician.    Charlesetta Shanks, MD 11/24/14 [phone]

## 2014-11-24 NOTE — Discharge Instructions (Signed)
Chest Pain (Nonspecific) °It is often hard to give a diagnosis for the cause of chest pain. There is always a chance that your pain could be related to something serious, such as a heart attack or a blood clot in the lungs. You need to follow up with your doctor. °HOME CARE °· If antibiotic medicine was given, take it as directed by your doctor. Finish the medicine even if you start to feel better. °· For the next few days, avoid activities that bring on chest pain. Continue physical activities as told by your doctor. °· Do not use any tobacco products. This includes cigarettes, chewing tobacco, and e-cigarettes. °· Avoid drinking alcohol. °· Only take medicine as told by your doctor. °· Follow your doctor's suggestions for more testing if your chest pain does not go away. °· Keep all doctor visits you made. °GET HELP IF: °· Your chest pain does not go away, even after treatment. °· You have a rash with blisters on your chest. °· You have a fever. °GET HELP RIGHT AWAY IF:  °· You have more pain or pain that spreads to your arm, neck, jaw, back, or belly (abdomen). °· You have shortness of breath. °· You cough more than usual or cough up blood. °· You have very bad back or belly pain. °· You feel sick to your stomach (nauseous) or throw up (vomit). °· You have very bad weakness. °· You pass out (faint). °· You have chills. °This is an emergency. Do not wait to see if the problems will go away. Call your local emergency services (911 in [place] Do not drive yourself to the hospital. °MAKE SURE YOU:  °· Understand these instructions. °· Will watch your condition. °· Will get help right away if you are not doing well or get worse. °Document Released: 05/21/2008 Document Revised: 12/08/2013 Document Reviewed: 05/21/2008 °ExitCare® Patient Information ©2015 [employer]. This information is not intended to replace advice given to you by your health care provider. Make sure you discuss any questions you have with your  health care provider. ° ° °Emergency Department Resource Guide °1) Find a Doctor and Pay Out of Pocket °Although you won't have to find out who is covered by your insurance plan, it is a good idea to ask around and get recommendations. You will then need to call the office and see if the doctor you have chosen will accept you as a new patient and what types of options they offer for patients who are self-pay. Some doctors offer discounts or will set up payment plans for their patients who do not have insurance, but you will need to ask so you aren't surprised when you get to your appointment. ° °2) Contact Your Local Health Department °Not all health departments have doctors that can see patients for sick visits, but many do, so it is worth a call to see if yours does. If you don't know where your local health department is, you can check in your phone book. The CDC also has a tool to help you locate your state's health department, and many state websites also have listings of all of their local health departments. ° °3) Find a Walk-in Clinic °If your illness is not likely to be very severe or complicated, you may want to try a walk in clinic. These are popping up all over the country in pharmacies, drugstores, and shopping centers. They're usually staffed by nurse practitioners or physician assistants that have been trained to treat common   illnesses and complaints. They're usually fairly quick and inexpensive. However, if you have serious medical issues or chronic medical problems, these are probably not your best option. ° °No Primary Care Doctor: °- Call [facility] at  832-8000 - they can help you locate a primary care doctor that  accepts your insurance, provides certain services, etc. °- Physician Referral Service- 1-800-533-3463 ° °Chronic Pain Problems: °Organization         Address  Phone   Notes  °[facility]  (336) 297-2271 Patients need to be referred by their primary care doctor.   ° °Medication Assistance: °Organization         Address  Phone   Notes  °[place] [facility] [address] °[place], [place] [address] (336) 641-8030 --Must be a resident of [place] °-- Must have NO insurance coverage whatsoever (no Medicaid/ Medicare, etc.) °-- The pt. MUST have a primary care doctor that directs their care regularly and follows them in the community °  °[facility]  (866) 331-1348   °[employer]  (888) 892-1162   ° °Agencies that provide inexpensive medical care: °Organization         Address  Phone   Notes  °[facility]  (336) 832-8035   °[facility]    (336) 832-7272   °[facility] [address] °[place], [place] [address] (336) 832-4777   °[facility] [address], °[place] (336) 271-4999   °[facility]    (336) 373-0678   °[facility]    (336) 272-1050   °[facility] ° [address], [place] Phone:  (336) 832-4444, Fax:  (336) 832-4440 Hours of Operation:  9 am - 6 pm, M-F.  Also accepts Medicaid/Medicare and self-pay.  °[facility] ° [address], [place] Phone: (336) 832-3150, Fax: (336) 832-3151. Hours of Operation:  8:30 am - 5:30 pm, M-F.  Also accepts Medicaid and self-pay.  °[facility] [place] [address], [place] Phone: (336) 878-6027   °[facility] [address], [place], [place] (336)723-1848, Ext. 123 Mondays & Thursdays: 7-9 AM.  First 15 patients are seen on a first come, first serve basis. °  ° °Medicaid-accepting [place] Providers: ° °Organization         Address  Phone   Notes  °[facility] [address], [place] (336) 641-2100 Also accepts self-pay patients.  °[facility] [address], [place] ° (336) 856-9996   °[facility] [address], [place] (336) 288-8857   °[facility] [address] [place] [address], [place] (336) 299-7000   °Veita Bland [address], [place]  ° (336) 373-1557 Only accepts [place] Access Medicaid patients after they have their name applied to their card.  ° °Self-Pay (no insurance) in [place]: ° °Organization         Address  Phone   Notes  °Sickle Cell Patients, [facility] [address], [place] (336) 832-1970   °[facility] [address], [place] (336) 832-4400   °[facility] ° [address], [place] (336) 992-4800   °[facility]/Dr. Osei-Bonsu ° [address] [place] [address],  or [address],   [place] (336) 841-8500 Phone number for both [place] and [place] locations is the same.  °[facility] [address], [place] (336) 299-0000   °[facility] [address] [place] [address], [place] or [address] (336) 852-7530 °(336) 878-2260   °[facility] [address], [place] (336) 350-1642, phone; (336) 294-5005, fax Sees patients 1st and 3rd Saturday of every month.  Must not qualify for public or private insurance (i.e. Medicaid, Medicare, [place] Health Choice, Veterans' Benefits) • Household income should be no more than 200% of the poverty level •The clinic cannot treat you if you are pregnant or think you are pregnant • Sexually transmitted diseases are not treated at the clinic.  ° ° °Dental Care: °Organization         Address  Phone  Notes  °[facility] [address], [place] (336) 641-6152 Accepts children up to age 21 who are enrolled in Medicaid or [place] Health Choice; pregnant women with a Medicaid card; and children who have applied for Medicaid or [place] Health Choice, but were declined, whose parents can pay a reduced fee at time of service.    °[facility] [place]  [address], [place] (336) 641-7733 Accepts children up to age 21 who are enrolled in Medicaid or [place] Health Choice; pregnant women with a Medicaid card; and children who have applied for Medicaid or [place] Health Choice, but were declined, whose parents can pay a reduced fee at time of service.  °[facility] ° [address], [place] (336) 641-4533 Patients are seen by appointment only. Walk-ins are not accepted. [facility] will see patients 18 years of age and older. °Monday - Tuesday (8am-5pm) °Most Wednesdays (8:30-5pm) °$30 per visit, cash only  °[facility] ° [address], [place] (336) 641-4533 Patients are seen by appointment only. Walk-ins are not accepted. [facility] will see patients 18 years of age and older. °One Wednesday Evening (Monthly: Volunteer Based).  $30 per visit, cash only  °[facility]  (919) 537-3737 for adults; Children under age 4, call Graduate Pediatric Dentistry at ([phone]) [phone]. Children aged 4-14, please call ([phone]) [phone] to request a pediatric application. ° Dental services are provided in all areas of dental care including fillings, crowns and bridges, complete and partial dentures, implants, gum treatment, root canals, and extractions. Preventive care is also provided. Treatment is provided to both adults and children. °Patients are selected via a lottery and there is often a waiting list. °  °[facility] [address], °[place] ° (336) 763-8833 [email or web address] °  °[facility] [address], [place], [place] (336)723-1848, Ext. 123 Second and Fourth Thursday of each month, opens at 6:30 AM; Clinic ends at 9 AM.  Patients are seen on a first-come first-served basis, and a limited number are seen during each clinic.  ° °[facility] ° [address], [place],  (336)  723-7904   Eligibility Requirements °You must have lived in [place], [place], or [place] counties for at least the last three months. °  You cannot be eligible for state or federal sponsored healthcare insurance, including Veterans Administration, Medicaid, or Medicare. °  You generally cannot be eligible for healthcare insurance through your employer.  °  How to apply: °Eligibility screenings are held every Tuesday   and Wednesday afternoon from 1:00 pm until 4:00 pm. You do not need an appointment for the interview!  °[facility] [address], [place], [place] 336-631-2330   °[facility]  336-342-8273   °[facility]  336-703-3100   °[facility]  336-570-6415   ° °Behavioral Health Resources in the Community: °Intensive Outpatient Programs °Organization         Address  Phone  Notes  °[facility] [address], [place], [place] 336-878-6098   °[facility] [address], [place], [place] 336-832-9800   °[facility] [address], [place], [place] ° 336-882-2125   °[facility] [address],  °[place], [place] 1-800-853-5163 or 336-641-4981   °Substance Abuse Resources °Organization         Address  Phone  Notes  °[facility]  336-882-2125   °[facility]  336-784-9470   °[facility]  336-285-9073   °[facility]  336-845-3988   °Residential & Outpatient Substance Abuse Program  1-800-659-3381   °Psychological Services °Organization         Address  Phone  Notes  °[facility]  336- 832-9600   °[facility]  336- 378-7881   °[facility] [address], [place] 1-800-853-5163 or 336-641-4981   ° °Mobile Crisis Teams °Organization         Address  Phone  Notes  °[facility], Mobile Crisis Care Unit  1-877-626-1772   °Assertive °[facility] ° [address] [place], [place] 336-834-9664   °Sharon DeEsch [address] °[place] 336-554-5454   ° °Self-Help/Support Groups °Organization         Address  Phone             Notes  °[facility] - variety of support groups  336- 373-1402 Call for more information  °Narcotics Anonymous (NA), [facility] [address], °[place]  2 meetings at this location  ° °Residential Treatment Programs °Organization         Address  Phone  Notes  °[facility] [address],    °[place]  1-866-801-8205   °[facility] ° [address], [place], [place] 704-293-8524   °[facility] [address], [place] 336-845-3988 Admissions: 8am-3pm M-F  °[facility] [address]    °[place], [place] 336-841-1104   °[facility] [address], [place], [place] 336-379-7146   °[facility] [address]  °[place], [place] 336-285-9073   °[facility] - Intensive Outpatient [address], [place], [place] 336-852-3033   °[facility] [address]  °[place], [place] 1-877-615-2722 or 336-784-9470   °[facility] [address] [place], [place] 336-227-7417 Accepts Medicaid  °[facility] [address]  ° [place] 1-800-659-3381 Substance Abuse/Addiction Treatment  ° °[place] Behavioral Health Resources °Organization         Address  Phone  Notes  °[facility]  (888) 581-9988   °Julie Brannon, PhD [address] [place], [place]   (336) 349-5553 or (336) 951-0000   °[facility]   [address] °[place], [place] (336) 349-4454   °[facility] [address], [place], [place] (336) 342-8316 Insurance/Medicaid/sponsorship through [facility]  °[facility] [address]                                    [place], [place] (336) 342-8316  Therapy/tele-psych/case  °[facility] [address]  ° [place], [place] (336) 349-2233    °Dr. Arfeen  (336) 349-4544   °[facility] [place]  [employer] [place] [facility] 1) [address], [place] °2) [address], [place] °3)  [address], [place] (336) 349-3220 °(336) 342-7768 ° °(336) 342-8140   °[place] [facility] (336) 342-1394 or (336) 342-3537 (After Hours)    ° ° ° °

## 2014-11-24 NOTE — ED Notes (Signed)
Pt presents with c/o chest pain that has been intermittent for the past several weeks. Pt describes the pain as aching in nature and reports it is in the center of her chest. Pt reports some dizziness but denies any shortness of breath. Pt reports that last night she had a pain between her shoulder blades but it has since eased off. Ambulatory to triage.

## 2014-11-24 NOTE — ED Notes (Signed)
MD at bedside. 

## 2014-11-25 ENCOUNTER — Encounter (HOSPITAL_COMMUNITY): Payer: Self-pay | Admitting: Psychiatry

## 2014-11-25 ENCOUNTER — Ambulatory Visit (INDEPENDENT_AMBULATORY_CARE_PROVIDER_SITE_OTHER): Payer: Medicare Other | Admitting: Psychiatry

## 2014-11-25 VITALS — BP 122/73 | HR 83 | Ht 64.0 in | Wt 159.8 lb

## 2014-11-25 DIAGNOSIS — R4189 Other symptoms and signs involving cognitive functions and awareness: Secondary | ICD-10-CM

## 2014-11-25 DIAGNOSIS — F4310 Post-traumatic stress disorder, unspecified: Secondary | ICD-10-CM

## 2014-11-25 DIAGNOSIS — F331 Major depressive disorder, recurrent, moderate: Secondary | ICD-10-CM

## 2014-11-25 MED ORDER — CLONAZEPAM 0.5 MG PO TABS: 0.5000 mg | ORAL_TABLET | Freq: Every day | ORAL | Status: DC

## 2014-11-25 MED ORDER — SERTRALINE HCL 100 MG PO TABS: 200.0000 mg | ORAL_TABLET | Freq: Every day | ORAL | Status: DC

## 2014-11-25 MED ORDER — OLANZAPINE 20 MG PO TBDP
20.0000 mg | ORAL_TABLET | Freq: Every day | ORAL | Status: DC
Start: 2014-11-25 — End: 2014-12-20

## 2014-11-25 MED ORDER — MIRTAZAPINE 45 MG PO TBDP: 45.0000 mg | ORAL_TABLET | Freq: Every day | ORAL | Status: DC

## 2014-11-25 MED ORDER — BUPROPION HCL ER (XL) 150 MG PO TB24: 150.0000 mg | ORAL_TABLET | Freq: Every day | ORAL | Status: DC

## 2014-11-25 NOTE — Progress Notes (Signed)
[facility] Progress Note   Kendra Simmons [id number] 63 y.o.  11/25/2014 11:47 AM  Chief Complaint:  I am feeling better but I still have insomnia.  I cannot sleep more than 4 hours.    History of Present Illness:  Kendra Simmons came for her follow-up appointment with her sister.  She recently discharged from [facility] .  She is taking Zyprexa 15 mg at bedtime along with Klonopin, Remeron and Zoloft.  Her Remeron was also increased .  Overall she is feeling better than she has no suicidal thoughts or crying spells but she still have anxiety, nervousness and poor sleep.  Yesterday she was went to the emergency room because of chest pain however her cardiac enzymes were normal.  She was scheduled to have sleep study however it was canceled because she was admitted to behavioral [facility].  She has not rescheduled the sleep study yet.  She is living at her own place however her sister frequently visits her.  She denies any feeling of hopelessness or worthlessness and she liked the Zyprexa better than Seroquel.  She denies any agitation, anger, mood swing.  She denies drinking or using any illegal substances.  She also willing to see a therapist for counseling in this office.  She has no tremors, shakes or any EPS.  Her appetite is fair.  She has not gained a lot of weight and she is concerned about it.  She denies any paranoia or any hallucination.  She still complaining of tiredness feeling which could be due to lack of sleep.  She admitted snoring and she believe diagnosed apnea 10 years ago.  Suicidal Ideation: No Plan Formed: No Patient has means to carry out plan: No  Homicidal Ideation: No Plan Formed: No Patient has means to carry out plan: No   Past Psychiatric History/Hospitalization(s) Patient has at least 4 psychiatric hospitalizations.  Her last hospitalization was November 2015 at behavioral [facility] because of suicidal thoughts.  She has been  admitted to [facility] and multiple hospitalization at [place].  She has done once intensive outpatient program.  She has a history of hallucination, paranoia, severe depression. In the past she has seen [name], [name] and [name].  As per chart she has taken in the past doxepin, Xanax, Seroquel, amitriptyline, imipramine, Wellbutrin, Prozac and recently Seroquel .   Anxiety: Yes Bipolar Disorder: No Depression: Yes Mania: No Psychosis: History of hallucinations Schizophrenia: No Personality Disorder: No Hospitalization for psychiatric illness: Yes History of Electroconvulsive Shock Therapy: No Prior Suicide Attempts: No  Medical History; Patient has high cholesterol, memory impairment, GERD and palpitation.  She has history of frequent falls and seen by a neurologist in the past.  Her primary care physician is [name] in [place].  Review of Systems  Constitutional:       Tired  Skin: Negative.  Negative for itching and rash.  Neurological:       Neuropathy in her feet  Psychiatric/Behavioral: The patient is nervous/anxious and has insomnia.        Memory impairment   Psychiatric: Agitation: No Hallucination: No Depressed Mood: No Insomnia: Yes Hypersomnia: No Altered Concentration: No Feels Worthless: No Grandiose Ideas: No Belief In Special Powers: No New/Increased Substance Abuse: No Compulsions: No  Neurologic: Headache: No Seizure: No Paresthesias: Yes   Musculoskeletal: Strength & Muscle Tone: within normal limits Gait & Station: normal Patient leans: N/A   Outpatient Encounter Prescriptions as  of 11/25/2014  Medication Sig  . buPROPion (WELLBUTRIN XL) 150 MG 24 hr tablet Take 1 tablet (150 mg total) by mouth daily. For depression  . clonazePAM (KLONOPIN) 0.5 MG tablet Take 1 tablet (0.5 mg total) by mouth at bedtime. For anxiety/insomnia  . cyanocobalamin (,VITAMIN B-12,) 1000 MCG/ML injection Inject 1,000 mcg  into the skin once a week.  . Ibuprofen-Diphenhydramine Cit (ADVIL PM PO) Take 0.5 tablets by mouth as needed (pain).  . mirtazapine (REMERON SOL-TAB) 45 MG disintegrating tablet Take 1 tablet (45 mg total) by mouth at bedtime. For depression  . olanzapine zydis (ZYPREXA) 20 MG disintegrating tablet Take 1 tablet (20 mg total) by mouth at bedtime. Dose increase  . omeprazole (PRILOSEC) 20 MG capsule Take 1 capsule (20 mg total) by mouth daily. For acid reflux  . OVER THE COUNTER MEDICATION Take 1 tablet by mouth daily. Potassium  . polyvinyl alcohol (LIQUIFILM TEARS) 1.4 % ophthalmic solution Place 1 drop into both eyes as needed for dry eyes.  . pravastatin (PRAVACHOL) 20 MG tablet Take 1 tablet (20 mg total) by mouth at bedtime. For high cholesterol  . sertraline (ZOLOFT) 100 MG tablet Take 2 tablets (200 mg total) by mouth daily. For depression  . tamsulosin (FLOMAX) 0.4 MG CAPS capsule Take 1 capsule (0.4 mg total) by mouth daily. For urinary retension  . Vitamin D, Ergocalciferol, (DRISDOL) 50000 UNITS CAPS capsule Take 1 capsule (50,000 Units total) by mouth every 7 (seven) days. On Wednesday: For bone health  . [DISCONTINUED] buPROPion (WELLBUTRIN XL) 150 MG 24 hr tablet Take 1 tablet (150 mg total) by mouth daily. For depression  . [DISCONTINUED] clonazePAM (KLONOPIN) 0.5 MG tablet Take 1 tablet (0.5 mg total) by mouth at bedtime. For anxiety/insomnia  . [DISCONTINUED] mirtazapine (REMERON SOL-TAB) 45 MG disintegrating tablet Take 1 tablet (45 mg total) by mouth at bedtime. For depression  . [DISCONTINUED] OLANZapine zydis (ZYPREXA) 15 MG disintegrating tablet Take 1 tablet (15 mg total) by mouth at bedtime. For mood control/insomnia  . [DISCONTINUED] sertraline (ZOLOFT) 100 MG tablet Take 2 tablets (200 mg total) by mouth daily. For depression    Recent Results (from the past 2160 hour(s))  Troponin I     Status: None   Collection Time: 10/13/14  9:53 AM  Result Value Ref Range    Troponin I <0.30 <0.30 ng/mL    Comment:        Due to the release kinetics of cTnI, a negative result within the first hours of the onset of symptoms does not rule out myocardial infarction with certainty. If myocardial infarction is still suspected, repeat the test at appropriate intervals.  CBC     Status: Abnormal   Collection Time: 10/13/14  9:53 AM  Result Value Ref Range   WBC 6.2 4.0 - 10.5 K/uL   RBC 4.77 3.87 - 5.11 MIL/uL   Hemoglobin 14.3 12.0 - 15.0 g/dL   HCT 43.5 36.0 - 46.0 %   MCV 91.2 78.0 - 100.0 fL   MCH 30.0 26.0 - 34.0 pg   MCHC 32.9 30.0 - 36.0 g/dL   RDW 13.8 11.5 - 15.5 %   Platelets 136 (L) 150 - 400 K/uL  Basic metabolic panel     Status: Abnormal   Collection Time: 10/13/14  9:53 AM  Result Value Ref Range   Sodium 145 137 - 147 mEq/L   Potassium 3.5 (L) 3.7 - 5.3 mEq/L   Chloride 104 96 - 112 mEq/L   CO2 24  19 - 32 mEq/L   Glucose, Bld 137 (H) 70 - 99 mg/dL   BUN 4 (L) 6 - 23 mg/dL   Creatinine, Ser 0.61 0.50 - 1.10 mg/dL   Calcium 9.5 8.4 - 10.5 mg/dL   GFR calc non Af Amer >90 >90 mL/min   GFR calc Af Amer >90 >90 mL/min    Comment: (NOTE) The eGFR has been calculated using the CKD EPI equation. This calculation has not been validated in all clinical situations. eGFR's persistently <90 mL/min signify possible Chronic Kidney Disease.   Anion gap 17 (H) 5 - 15  I-stat troponin, ED     Status: None   Collection Time: 10/13/14 10:05 AM  Result Value Ref Range   Troponin i, poc 0.00 0.00 - 0.08 ng/mL   Comment 3            Comment: Due to the release kinetics of cTnI, a negative result within the first hours of the onset of symptoms does not rule out myocardial infarction with certainty. If myocardial infarction is still suspected, repeat the test at appropriate intervals.  Urinalysis, Routine w reflex microscopic     Status: None   Collection Time: 10/13/14 12:58 PM  Result Value Ref Range   Color, Urine YELLOW YELLOW   APPearance CLEAR  CLEAR   Specific Gravity, Urine 1.007 1.005 - 1.030   pH 7.5 5.0 - 8.0   Glucose, UA NEGATIVE NEGATIVE mg/dL   Hgb urine dipstick NEGATIVE NEGATIVE   Bilirubin Urine NEGATIVE NEGATIVE   Ketones, ur NEGATIVE NEGATIVE mg/dL   Protein, ur NEGATIVE NEGATIVE mg/dL   Urobilinogen, UA 0.2 0.0 - 1.0 mg/dL   Nitrite NEGATIVE NEGATIVE   Leukocytes, UA NEGATIVE NEGATIVE    Comment: MICROSCOPIC NOT DONE ON URINES WITH NEGATIVE PROTEIN, BLOOD, LEUKOCYTES, NITRITE, OR GLUCOSE <1000 mg/dL.  Urine culture     Status: None   Collection Time: 10/13/14 12:58 PM  Result Value Ref Range   Specimen Description URINE, CATHETERIZED    Special Requests NONE    Culture  Setup Time      10/13/2014 17:54 Performed at [facility]      75,000 COLONIES/ML Performed at [facility]   Culture      Multiple bacterial morphotypes present, none predominant. Suggest appropriate recollection if clinically indicated. Performed at [facility]   Report Status 10/14/2014 FINAL   Troponin I     Status: None   Collection Time: 10/13/14  1:54 PM  Result Value Ref Range   Troponin I <0.30 <0.30 ng/mL    Comment:        Due to the release kinetics of cTnI, a negative result within the first hours of the onset of symptoms does not rule out myocardial infarction with certainty. If myocardial infarction is still suspected, repeat the test at appropriate intervals.  CBC     Status: Abnormal   Collection Time: 10/15/14 11:25 PM  Result Value Ref Range   WBC 5.9 4.0 - 10.5 K/uL   RBC 4.61 3.87 - 5.11 MIL/uL   Hemoglobin 13.5 12.0 - 15.0 g/dL   HCT 41.4 36.0 - 46.0 %   MCV 89.8 78.0 - 100.0 fL   MCH 29.3 26.0 - 34.0 pg   MCHC 32.6 30.0 - 36.0 g/dL   RDW 13.9 11.5 - 15.5 %   Platelets 140 (L) 150 - 400 K/uL  Comprehensive metabolic panel     Status: Abnormal   Collection Time:  10/15/14 11:25 PM  Result Value Ref Range   Sodium 142 137 - 147 mEq/L   Potassium 3.2 (L) 3.7 - 5.3  mEq/L   Chloride 104 96 - 112 mEq/L   CO2 24 19 - 32 mEq/L   Glucose, Bld 128 (H) 70 - 99 mg/dL   BUN 8 6 - 23 mg/dL   Creatinine, Ser 0.68 0.50 - 1.10 mg/dL   Calcium 9.3 8.4 - 10.5 mg/dL   Total Protein 6.9 6.0 - 8.3 g/dL   Albumin 4.0 3.5 - 5.2 g/dL   AST 55 (H) 0 - 37 U/L   ALT 23 0 - 35 U/L   Alkaline Phosphatase 74 39 - 117 U/L   Total Bilirubin 0.4 0.3 - 1.2 mg/dL   GFR calc non Af Amer >90 >90 mL/min   GFR calc Af Amer >90 >90 mL/min    Comment: (NOTE) The eGFR has been calculated using the CKD EPI equation. This calculation has not been validated in all clinical situations. eGFR's persistently <90 mL/min signify possible Chronic Kidney Disease.   Anion gap 14 5 - 15  Ethanol (ETOH)     Status: None   Collection Time: 10/15/14 11:25 PM  Result Value Ref Range   Alcohol, Ethyl (B) <11 0 - 11 mg/dL    Comment:        LOWEST DETECTABLE LIMIT FOR SERUM ALCOHOL IS 11 mg/dL FOR MEDICAL PURPOSES ONLY  Urine Drug Screen     Status: Abnormal   Collection Time: 10/15/14 11:31 PM  Result Value Ref Range   Opiates NONE DETECTED NONE DETECTED   Cocaine NONE DETECTED NONE DETECTED   Benzodiazepines POSITIVE (A) NONE DETECTED   Amphetamines NONE DETECTED NONE DETECTED   Tetrahydrocannabinol NONE DETECTED NONE DETECTED   Barbiturates POSITIVE (A) NONE DETECTED    Comment:        DRUG SCREEN FOR MEDICAL PURPOSES ONLY.  IF CONFIRMATION IS NEEDED FOR ANY PURPOSE, NOTIFY LAB WITHIN 5 DAYS.        LOWEST DETECTABLE LIMITS FOR URINE DRUG SCREEN Drug Class       Cutoff (ng/mL) Amphetamine      1000 Barbiturate      200 Benzodiazepine   093 Tricyclics       267 Opiates          300 Cocaine          300 THC              50  TSH     Status: None   Collection Time: 10/16/14  5:28 AM  Result Value Ref Range   TSH 2.870 0.350 - 4.500 uIU/mL    Comment: Performed at [facility]  Urine Drug Screen     Status: Abnormal   Collection Time: 11/02/14  4:52 PM  Result Value  Ref Range   Opiates NONE DETECTED NONE DETECTED   Cocaine NONE DETECTED NONE DETECTED   Benzodiazepines POSITIVE (A) NONE DETECTED   Amphetamines NONE DETECTED NONE DETECTED   Tetrahydrocannabinol NONE DETECTED NONE DETECTED   Barbiturates POSITIVE (A) NONE DETECTED    Comment:        DRUG SCREEN FOR MEDICAL PURPOSES ONLY.  IF CONFIRMATION IS NEEDED FOR ANY PURPOSE, NOTIFY LAB WITHIN 5 DAYS.        LOWEST DETECTABLE LIMITS FOR URINE DRUG SCREEN Drug Class       Cutoff (ng/mL) Amphetamine      1000 Barbiturate      200 Benzodiazepine  863 Tricyclics       817 Opiates          300 Cocaine          300 THC              50   Acetaminophen level     Status: None   Collection Time: 11/02/14  5:05 PM  Result Value Ref Range   Acetaminophen (Tylenol), Serum <15.0 10 - 30 ug/mL    Comment:        THERAPEUTIC CONCENTRATIONS VARY SIGNIFICANTLY. A RANGE OF 10-30 ug/mL MAY BE AN EFFECTIVE CONCENTRATION FOR MANY PATIENTS. HOWEVER, SOME ARE BEST TREATED AT CONCENTRATIONS OUTSIDE THIS RANGE. ACETAMINOPHEN CONCENTRATIONS >150 ug/mL AT 4 HOURS AFTER INGESTION AND >50 ug/mL AT 12 HOURS AFTER INGESTION ARE OFTEN ASSOCIATED WITH TOXIC REACTIONS.   CBC     Status: Abnormal   Collection Time: 11/02/14  5:05 PM  Result Value Ref Range   WBC 7.4 4.0 - 10.5 K/uL   RBC 5.25 (H) 3.87 - 5.11 MIL/uL   Hemoglobin 15.7 (H) 12.0 - 15.0 g/dL   HCT 46.5 (H) 36.0 - 46.0 %   MCV 88.6 78.0 - 100.0 fL   MCH 29.9 26.0 - 34.0 pg   MCHC 33.8 30.0 - 36.0 g/dL   RDW 13.6 11.5 - 15.5 %   Platelets 168 150 - 400 K/uL  Comprehensive metabolic panel     Status: Abnormal   Collection Time: 11/02/14  5:05 PM  Result Value Ref Range   Sodium 148 (H) 137 - 147 mEq/L   Potassium 3.4 (L) 3.7 - 5.3 mEq/L   Chloride 103 96 - 112 mEq/L   CO2 22 19 - 32 mEq/L   Glucose, Bld 163 (H) 70 - 99 mg/dL   BUN 19 6 - 23 mg/dL   Creatinine, Ser 0.81 0.50 - 1.10 mg/dL   Calcium 10.6 (H) 8.4 - 10.5 mg/dL   Total  Protein 7.8 6.0 - 8.3 g/dL   Albumin 4.6 3.5 - 5.2 g/dL   AST 27 0 - 37 U/L   ALT 17 0 - 35 U/L   Alkaline Phosphatase 68 39 - 117 U/L   Total Bilirubin 0.5 0.3 - 1.2 mg/dL   GFR calc non Af Amer 76 (L) >90 mL/min   GFR calc Af Amer 88 (L) >90 mL/min    Comment: (NOTE) The eGFR has been calculated using the CKD EPI equation. This calculation has not been validated in all clinical situations. eGFR's persistently <90 mL/min signify possible Chronic Kidney Disease.    Anion gap 23 (H) 5 - 15  Ethanol (ETOH)     Status: None   Collection Time: 11/02/14  5:05 PM  Result Value Ref Range   Alcohol, Ethyl (B) <11 0 - 11 mg/dL    Comment:        LOWEST DETECTABLE LIMIT FOR SERUM ALCOHOL IS 11 mg/dL FOR MEDICAL PURPOSES ONLY   Salicylate level     Status: Abnormal   Collection Time: 11/02/14  5:05 PM  Result Value Ref Range   Salicylate Lvl <7.1 (L) 2.8 - 20.0 mg/dL  Hemoglobin A1c     Status: Abnormal   Collection Time: 11/04/14  6:50 AM  Result Value Ref Range   Hgb A1c MFr Bld 5.8 (H) <5.7 %    Comment: (NOTE)  According to the ADA Clinical Practice Recommendations for 2011, when HbA1c is used as a screening test:  >=6.5%   Diagnostic of Diabetes Mellitus           (if abnormal result is confirmed) 5.7-6.4%   Increased risk of developing Diabetes Mellitus References:Diagnosis and Classification of Diabetes Mellitus,Diabetes RTMY,1117,35(APOLI 1):S62-S69 and Standards of Medical Care in         Diabetes - 2011,Diabetes DCVU,1314,38 (Suppl 1):S11-S61.    Mean Plasma Glucose 120 (H) <117 mg/dL    Comment: Performed at [facility]  Lipid panel     Status: Abnormal   Collection Time: 11/04/14  6:50 AM  Result Value Ref Range   Cholesterol 177 0 - 200 mg/dL   Triglycerides 79 <150 mg/dL   HDL 45 >39 mg/dL   Total CHOL/HDL Ratio 3.9 RATIO   VLDL 16 0 - 40 mg/dL   LDL Cholesterol 116 (H) 0 - 99 mg/dL     Comment:        Total Cholesterol/HDL:CHD Risk Coronary Heart Disease Risk Table                     Men   Women  1/2 Average Risk   3.4   3.3  Average Risk       5.0   4.4  2 X Average Risk   9.6   7.1  3 X Average Risk  23.4   11.0        Use the calculated Patient Ratio above and the CHD Risk Table to determine the patient's CHD Risk.        ATP III CLASSIFICATION (LDL):  <100     mg/dL   Optimal  100-129  mg/dL   Near or Above                    Optimal  130-159  mg/dL   Borderline  160-189  mg/dL   High  >190     mg/dL   Very High Performed at [facility]   CBC     Status: Abnormal   Collection Time: 11/24/14  3:59 PM  Result Value Ref Range   WBC 4.8 4.0 - 10.5 K/uL   RBC 4.35 3.87 - 5.11 MIL/uL   Hemoglobin 12.6 12.0 - 15.0 g/dL   HCT 38.8 36.0 - 46.0 %   MCV 89.2 78.0 - 100.0 fL   MCH 29.0 26.0 - 34.0 pg   MCHC 32.5 30.0 - 36.0 g/dL   RDW 13.0 11.5 - 15.5 %   Platelets 135 (L) 150 - 400 K/uL  Comprehensive metabolic panel     Status: Abnormal   Collection Time: 11/24/14  3:59 PM  Result Value Ref Range   Sodium 139 137 - 147 mEq/L   Potassium 4.0 3.7 - 5.3 mEq/L   Chloride 102 96 - 112 mEq/L   CO2 22 19 - 32 mEq/L   Glucose, Bld 126 (H) 70 - 99 mg/dL   BUN 11 6 - 23 mg/dL   Creatinine, Ser 0.76 0.50 - 1.10 mg/dL   Calcium 9.7 8.4 - 10.5 mg/dL   Total Protein 7.1 6.0 - 8.3 g/dL   Albumin 4.1 3.5 - 5.2 g/dL   AST 22 0 - 37 U/L   ALT 21 0 - 35 U/L   Alkaline Phosphatase 70 39 - 117 U/L   Total Bilirubin 0.3 0.3 - 1.2 mg/dL   GFR calc non Af Amer 88 (L) >  90 mL/min   GFR calc Af Amer >90 >90 mL/min    Comment: (NOTE) The eGFR has been calculated using the CKD EPI equation. This calculation has not been validated in all clinical situations. eGFR's persistently <90 mL/min signify possible Chronic Kidney Disease.    Anion gap 15 5 - 15  I-stat troponin, ED     Status: None   Collection Time: 11/24/14  4:04 PM  Result Value Ref Range    Troponin i, poc 0.00 0.00 - 0.08 ng/mL   Comment 3            Comment: Due to the release kinetics of cTnI, a negative result within the first hours of the onset of symptoms does not rule out myocardial infarction with certainty. If myocardial infarction is still suspected, repeat the test at appropriate intervals.       Constitutional:  BP 122/73 mmHg  Pulse 83  Ht 5' 4" (1.626 m)  Wt 159 lb 12.8 oz (72.485 kg)  BMI 27.42 kg/m2   Mental Status Examination;  Patient is casually dressed and fairly groomed.  Her speech is slow, clear and coherent.  Her thought process slow and logical.  She described her mood anxious and tired.  Her affect is constricted.  Her psychomotor activity is slightly decreased.  She denies any active or passive suicidal thoughts or homicidal thought.  There were no delusions, paranoia or any obsessive thoughts.  Her attention and concentration is fair.  Her fund of knowledge is average.  She has difficulty remembering things.  She has no tremors or shakes.  She is alert and oriented 3.  Her insight, judgment and impulse control is fair.   Established Problem, Stable/Improving (1), Review of Psycho-Social Stressors (1), Review or order clinical lab tests (1), Review and summation of old records (2), Review of Last Therapy Session (1), Review of Medication Regimen & Side Effects (2) and Review of New Medication or Change in Dosage (2)  Assessment: Axis I: Maj. depressive disorder, recurrent severe, posttraumatic stress disorder, cognitive disorder NOS  Axis II: Deferred  Axis III:  Past Medical History  Diagnosis Date  . High cholesterol   . Depression   . Anxiety   . GERD (gastroesophageal reflux disease)   . Insomnia   . Arthritis     Axis IV: Moderate   Plan:  I reviewed discharge summary, recent blood work, current medication and psychosocial stressors.  She is taking multiple medication and she still have insomnia.  I strongly encouraged to  have sleep study since despite taking multiple medication she still have insomnia.  At this time she does not have any side effects other than feeling tired.  I will try increasing Zyprexa 20 mg to help the insomnia and anxiety symptoms.  I will also schedule appointment with a therapist in this office.  Continue Remeron 45 mg at bedtime, Klonopin 0.5 mg at bedtime and Wellbutrin XL 150 mg daily.  Discussed medication side effects in detail.  I will see her again in 4 weeks. Time spent 25 minutes.  More than 50% of the time spent in psychoeducation, counseling and coordination of care.  Discuss safety plan that anytime having active suicidal thoughts or homicidal thoughts then patient need to call 911 or go to the local emergency room.   Eli Pattillo T., MD 11/25/2014

## 2014-11-29 DIAGNOSIS — H401230 Low-tension glaucoma, bilateral, stage unspecified: Secondary | ICD-10-CM | POA: Diagnosis not present

## 2014-12-02 ENCOUNTER — Other Ambulatory Visit (HOSPITAL_COMMUNITY): Payer: Self-pay | Admitting: Psychiatry

## 2014-12-02 ENCOUNTER — Ambulatory Visit (HOSPITAL_COMMUNITY): Payer: Self-pay | Admitting: Clinical

## 2014-12-03 ENCOUNTER — Telehealth (HOSPITAL_COMMUNITY): Payer: Self-pay | Admitting: *Deleted

## 2014-12-03 NOTE — Telephone Encounter (Signed)
Chart reviewed, Remeron refill not appropriate. Dosage had change to 45mg  and filled 11/25/14.

## 2014-12-03 NOTE — Telephone Encounter (Signed)
Pt called for refill of Remeron. Chart reviewed. Refill not appropriate, filled 11/25/14. Notified patient.

## 2014-12-13 ENCOUNTER — Telehealth (HOSPITAL_COMMUNITY): Payer: Self-pay | Admitting: *Deleted

## 2014-12-13 ENCOUNTER — Inpatient Hospital Stay (HOSPITAL_COMMUNITY)
Admission: EM | Admit: 2014-12-13 | Discharge: 2014-12-20 | DRG: 885 | Disposition: A | Discharge status: Home / Self Care | Payer: Medicare Other | Source: Intra-hospital | Attending: Psychiatry | Admitting: Psychiatry

## 2014-12-13 ENCOUNTER — Encounter (HOSPITAL_COMMUNITY): Payer: Self-pay | Admitting: *Deleted

## 2014-12-13 ENCOUNTER — Emergency Department (HOSPITAL_COMMUNITY)
Admission: EM | Admit: 2014-12-13 | Discharge: 2014-12-13 | Disposition: A | Discharge status: Other Acute Inpatient Hospital | Payer: Medicare Other | Source: Home / Self Care | Attending: Emergency Medicine | Admitting: Emergency Medicine

## 2014-12-13 DIAGNOSIS — E780 Pure hypercholesterolemia: Secondary | ICD-10-CM | POA: Diagnosis present

## 2014-12-13 DIAGNOSIS — M1990 Unspecified osteoarthritis, unspecified site: Secondary | ICD-10-CM | POA: Diagnosis present

## 2014-12-13 DIAGNOSIS — R4589 Other symptoms and signs involving emotional state: Secondary | ICD-10-CM

## 2014-12-13 DIAGNOSIS — R45851 Suicidal ideations: Secondary | ICD-10-CM

## 2014-12-13 DIAGNOSIS — Z833 Family history of diabetes mellitus: Secondary | ICD-10-CM

## 2014-12-13 DIAGNOSIS — F332 Major depressive disorder, recurrent severe without psychotic features: Secondary | ICD-10-CM | POA: Diagnosis not present

## 2014-12-13 DIAGNOSIS — F329 Major depressive disorder, single episode, unspecified: Secondary | ICD-10-CM | POA: Diagnosis present

## 2014-12-13 DIAGNOSIS — R4689 Other symptoms and signs involving appearance and behavior: Principal | ICD-10-CM

## 2014-12-13 DIAGNOSIS — Z79899 Other long term (current) drug therapy: Secondary | ICD-10-CM | POA: Insufficient documentation

## 2014-12-13 DIAGNOSIS — F410 Panic disorder [episodic paroxysmal anxiety] without agoraphobia: Secondary | ICD-10-CM | POA: Diagnosis present

## 2014-12-13 DIAGNOSIS — G4700 Insomnia, unspecified: Secondary | ICD-10-CM | POA: Diagnosis present

## 2014-12-13 DIAGNOSIS — K219 Gastro-esophageal reflux disease without esophagitis: Secondary | ICD-10-CM

## 2014-12-13 DIAGNOSIS — E785 Hyperlipidemia, unspecified: Secondary | ICD-10-CM

## 2014-12-13 DIAGNOSIS — Z609 Problem related to social environment, unspecified: Secondary | ICD-10-CM | POA: Diagnosis present

## 2014-12-13 DIAGNOSIS — Z599 Problem related to housing and economic circumstances, unspecified: Secondary | ICD-10-CM

## 2014-12-13 DIAGNOSIS — R5383 Other fatigue: Secondary | ICD-10-CM | POA: Diagnosis not present

## 2014-12-13 LAB — URINALYSIS, ROUTINE W REFLEX MICROSCOPIC
GLUCOSE, UA: NEGATIVE mg/dL
HGB URINE DIPSTICK: NEGATIVE
KETONES UR: NEGATIVE mg/dL
Nitrite: NEGATIVE
Protein, ur: NEGATIVE mg/dL
Specific Gravity, Urine: 1.028 (ref 1.005–1.030)
Urobilinogen, UA: 1 mg/dL (ref 0.0–1.0)
pH: 5.5 (ref 5.0–8.0)

## 2014-12-13 LAB — COMPREHENSIVE METABOLIC PANEL
ALK PHOS: 67 U/L (ref 39–117)
ALT: 12 U/L (ref 0–35)
AST: 24 U/L (ref 0–37)
Albumin: 4.5 g/dL (ref 3.5–5.2)
Anion gap: 8 (ref 5–15)
BILIRUBIN TOTAL: 0.6 mg/dL (ref 0.3–1.2)
BUN: 16 mg/dL (ref 6–23)
CHLORIDE: 110 meq/L (ref 96–112)
CO2: 22 mmol/L (ref 19–32)
Calcium: 9.1 mg/dL (ref 8.4–10.5)
Creatinine, Ser: 0.57 mg/dL (ref 0.50–1.10)
GFR calc Af Amer: >90 mL/min (ref >90)
Glucose, Bld: 150 mg/dL — ABNORMAL HIGH (ref 70–99)
POTASSIUM: 3.6 mmol/L (ref 3.5–5.1)
SODIUM: 140 mmol/L (ref 135–145)
Total Protein: 7.2 g/dL (ref 6.0–8.3)

## 2014-12-13 LAB — URINE MICROSCOPIC-ADD ON

## 2014-12-13 LAB — RAPID URINE DRUG SCREEN, HOSP PERFORMED
Amphetamines: NOT DETECTED
Barbiturates: NOT DETECTED
Benzodiazepines: POSITIVE — AB
Cocaine: NOT DETECTED
OPIATES: NOT DETECTED
Tetrahydrocannabinol: NOT DETECTED

## 2014-12-13 LAB — CBC
HCT: 42 % (ref 36.0–46.0)
Hemoglobin: 14 g/dL (ref 12.0–15.0)
MCH: 29.7 pg (ref 26.0–34.0)
MCHC: 33.3 g/dL (ref 30.0–36.0)
MCV: 89.2 fL (ref 78.0–100.0)
Platelets: 142 10*3/uL — ABNORMAL LOW (ref 150–400)
RBC: 4.71 MIL/uL (ref 3.87–5.11)
RDW: 13.2 % (ref 11.5–15.5)
WBC: 5.6 10*3/uL (ref 4.0–10.5)

## 2014-12-13 LAB — ACETAMINOPHEN LEVEL

## 2014-12-13 LAB — ETHANOL: Alcohol, Ethyl (B): <5 mg/dL (ref 0–9)

## 2014-12-13 LAB — SALICYLATE LEVEL: Salicylate Lvl: <4 mg/dL (ref 2.8–20.0)

## 2014-12-13 MED ORDER — TRAZODONE HCL 50 MG PO TABS
50.0000 mg | ORAL_TABLET | Freq: Every evening | ORAL | Status: DC | PRN
  Administered 2014-12-13: 50 mg via ORAL
  Filled 2014-12-13 (×7): qty 1

## 2014-12-13 MED ORDER — ACETAMINOPHEN 325 MG PO TABS
650.0000 mg | ORAL_TABLET | Freq: Four times a day (QID) | ORAL | Status: DC | PRN
  Administered 2014-12-13 – 2014-12-14 (×2): 650 mg via ORAL
  Filled 2014-12-13 (×2): qty 2

## 2014-12-13 MED ORDER — ALUM & MAG HYDROXIDE-SIMETH 200-200-20 MG/5ML PO SUSP: 30.0000 mL | ORAL | Status: DC | PRN

## 2014-12-13 MED ORDER — MAGNESIUM HYDROXIDE 400 MG/5ML PO SUSP: 30.0000 mL | Freq: Every day | ORAL | Status: DC | PRN

## 2014-12-13 MED ORDER — OLANZAPINE 5 MG PO TBDP
15.0000 mg | ORAL_TABLET | Freq: Every day | ORAL | Status: DC
  Administered 2014-12-13: 15 mg via ORAL
  Filled 2014-12-13 (×6): qty 1

## 2014-12-13 NOTE — ED Notes (Signed)
Pt reports depression and hopelessness, denies SI. Denies HI, AH/VH. Reports she is having issues sleeping.

## 2014-12-13 NOTE — Progress Notes (Signed)
Pt admitted to Observation unit for depression. +SI, denies plan, verbally contracts for safety "I'm feeling hopeless, medicine not enough". -HI, -A/[place],  C/o  depression, loneliness, worrying, hopelessness and helplessness. " I'm worried about what's going to happen to me in the future". "I'm not sleeping, I can't sleep in the rain, it bothers me". Denies drug or alcohol use. Lives alone and reports feeling a burden to her sister who looks after her. Emotional support and encouragement given. Will monitor closely and evaluate for stabilization.

## 2014-12-13 NOTE — Telephone Encounter (Signed)
Pt called and left a message for office to call her back due to her anxiety increasing and depression getting worse. Called pt back and per pt, he have thought suicidal thoughts but have not come up with any plans. Per pt, she feels like she need to be admitted to inpt and need to talk to Dr. Adele Schilder. Informed pt that Dr. Adele Schilder is out of the office and redirected her to the Environmental education officer. Per Environmental education officer, pt should have her sister Kristeen Miss to bring her to inpt and if she need any help to call office back. Pt showed understanding and agreed with plan

## 2014-12-13 NOTE — BH Assessment (Addendum)
Assessment Note  Kendra Simmons is an 63 y.o. female stating that she feels increasingly depressed and hopeless. Patient is suicidal without a plan. She sts, "If you send me home I will die". She reports on-going issues with depression. Patient is under the care of Dr. Adele Schilder with the [facility] outpatient department. She reports recent medication changes preventing her from sleeping. Patient not able to sleep for the past several days. Sts that not being able to sleep is the main trigger for her depression. She reports that she is also unable to deal with a lot of noise. Patient reports that something as simple as rain irritates her. No hx of prior suicide attempts. Patient denies HI and AVH's. No alcohol and drug use. Patient hospitalized at [facility] last November 2015 with similar symptoms. She was also hospitalized at [facility] in June 2015 and [facility] yrs ago. She was hospitalized at a number of facilities in [place] as well.   Axis I: Major Depressive Disorder, Recurrent, Severe without psychotic features Axis II: Deferred Axis III:  Past Medical History  Diagnosis Date  . High cholesterol   . Depression   . Anxiety   . GERD (gastroesophageal reflux disease)   . Insomnia   . Arthritis    Axis IV: other psychosocial or environmental problems, problems related to social environment, problems with access to health care services and problems with primary support group Axis V: 31-40 impairment in reality testing  Past Medical History:  Past Medical History  Diagnosis Date  . High cholesterol   . Depression   . Anxiety   . GERD (gastroesophageal reflux disease)   . Insomnia   . Arthritis     Past Surgical History  Procedure Laterality Date  . Cosmetic surgery    . Cesarean section    . Laproscopy      Family History:  Family History  Problem Relation Age of Onset  . Sleep apnea Father   . Alcohol abuse Father   . Diabetes Mother   . Diabetes Sister   . Diabetes Maternal Uncle   . Diabetes  Cousin     Social History:  reports that she has never smoked. She has never used smokeless tobacco. She reports that she does not drink alcohol or use illicit drugs.  Additional Social History:  Alcohol / Drug Use Pain Medications: SEE MAR Prescriptions: SEE MAR Over the Counter: SEE MAR History of alcohol / drug use?: No history of alcohol / drug abuse  CIWA: CIWA-Ar BP: 131/84 mmHg Pulse Rate: 106 COWS:    Allergies: No Known Allergies  Home Medications:  (Not in a hospital admission)  OB/GYN Status:  No LMP recorded. Patient is postmenopausal.  General Assessment Data Location of Assessment: [facility] ACT Assessment: Yes Is this an Initial Assessment or a Re-assessment for this encounter?: Initial Assessment Living Arrangements: Alone Can pt return to current living arrangement?: Yes Admission Status: Voluntary Is patient capable of signing voluntary admission?: Yes Transfer from: Acute Hospital Referral Source: Self/Family/Friend     [place] Living Arrangements: Alone Name of Psychiatrist: Dr. Adele Schilder Name of Therapist: None  Education Status Is patient currently in school?: No  Risk to self with the past 6 months Suicidal Ideation: Yes-Currently Present Suicidal Intent: No Is patient at risk for suicide?: Yes Suicidal Plan?: No Access to Means: No What has been your use of drugs/alcohol within the last 12 months?:  (n/a) Previous Attempts/Gestures: No How many times?:  (0) Other Self Harm  Risks:  (n/a) Triggers for Past Attempts: Other (Comment) Intentional Self Injurious Behavior: None Family Suicide History: Unknown Persecutory voices/beliefs?: No Depression: Yes Depression Symptoms: Feeling angry/irritable, Feeling worthless/self pity, Isolating, Fatigue, Guilt, Loss of interest in usual pleasures, Tearfulness, Despondent, Insomnia Substance abuse history and/or treatment for substance abuse?: No Suicide prevention information given to  non-admitted patients: Not applicable  Risk to Others within the past 6 months Homicidal Ideation: No Thoughts of Harm to Others: No Current Homicidal Intent: No Current Homicidal Plan: No Access to Homicidal Means: No Identified Victim:  (n/a) History of harm to others?: No Assessment of Violence: None Noted Violent Behavior Description:  (patient is calm and cooperative ) Does patient have access to weapons?: No Criminal Charges Pending?: No Does patient have a court date: No  Psychosis Hallucinations: None noted Delusions: None noted  Mental Status Report Appear/Hygiene: In scrubs, Poor hygiene Eye Contact: Good Motor Activity: Freedom of movement Speech: Logical/coherent Level of Consciousness: Drowsy Mood: Depressed Affect: Flat Anxiety Level: Severe Thought Processes: Coherent, Relevant Judgement: Impaired Orientation: Person, Place, Time, Situation Obsessive Compulsive Thoughts/Behaviors: None  Cognitive Functioning Concentration: Normal Memory: Recent Intact, Remote Intact IQ: Average Insight: Fair Impulse Control: Good Appetite: Good Weight Loss:  (40 pounds since June of last year) Weight Gain:  (none reported ) Sleep: Decreased Total Hours of Sleep:  ("I can't sleep") Vegetative Symptoms: None  ADLScreening [facility] Assessment Services) Patient's cognitive ability adequate to safely complete daily activities?: Yes Patient able to express need for assistance with ADLs?: Yes Independently performs ADLs?: Yes (appropriate for developmental age)  Prior Inpatient Therapy Prior Inpatient Therapy: Yes Prior Therapy Dates: July 2015 [facility]) (July 2015, November 2015 -[facility]; facilities in [place]) Prior Therapy Facilty/Provider(s): [facility] in [place] Reason for Treatment: Depression, Insomnia, SI  Prior Outpatient Therapy Prior Outpatient Therapy: Yes Prior Therapy Dates: Could not recall Prior Therapy Facilty/Provider(s): Could not recall Reason for Treatment:  Depression, SI, Insomnia  ADL Screening (condition at time of admission) Patient's cognitive ability adequate to safely complete daily activities?: Yes Is the patient deaf or have difficulty hearing?: No Does the patient have difficulty seeing, even when wearing glasses/contacts?: No Does the patient have difficulty concentrating, remembering, or making decisions?: Yes Patient able to express need for assistance with ADLs?: Yes Does the patient have difficulty dressing or bathing?: No Independently performs ADLs?: Yes (appropriate for developmental age) Does the patient have difficulty walking or climbing stairs?: No Weakness of Legs: None Weakness of Arms/Hands: None  Home Assistive Devices/Equipment Home Assistive Devices/Equipment: None    Abuse/Neglect Assessment (Assessment to be complete while patient is alone) Physical Abuse: Denies Verbal Abuse: Denies Sexual Abuse: Denies Exploitation of patient/patient's resources: Denies Self-Neglect: Denies Values / Beliefs Cultural Requests During Hospitalization: None Spiritual Requests During Hospitalization: None   Advance Directives (For Healthcare) Does patient have an advance directive?: No    Additional Information 1:1 In Past 12 Months?: No CIRT Risk: No Elopement Risk: No Does patient have medical clearance?: Yes     Disposition:  Disposition Initial Assessment Completed for this Encounter: Yes Disposition of Patient: Inpatient treatment program Type of inpatient treatment program:  (Patient accepted to [facility] observation unit #5)  On Site Evaluation by:   Reviewed with Physician:    Evangeline Gula 12/13/2014 7:31 PM

## 2014-12-13 NOTE — ED Notes (Signed)
Pt transported to [facility] by [facility] transportation service for continuation of specialized care. She left in no acute distress.

## 2014-12-13 NOTE — ED Notes (Signed)
1 pt belonging bag in locker #28

## 2014-12-13 NOTE — BH Assessment (Signed)
TTS Raquel Sarna informed TTS [place] of the consult.

## 2014-12-13 NOTE — Telephone Encounter (Signed)
Instructed by Beatriz Stallion to call [facility] Department [phone] to have dispatch do a safety check on pay due to pt stating she does not have a ride to get to inpt. Spoke with Lelon Frohlich and they will be sending someone out to pt house to check on her and will call office back with updates.

## 2014-12-13 NOTE — Plan of Care (Signed)
[name] Observation Crisis Plan  Reason for Crisis Plan:  Crisis Stabilization   Plan of Care:  Referral for IOP  Family Support:    Sister Kendra Simmons  Current Living Environment:   Lives alone  Insurance:   Hospital Account    Name Acct ID Class Status Primary Coverage   Kendra Simmons, Kendra Simmons [id number] [place] Open MEDICARE - MEDICARE PART A AND B        Guarantor Account (for Hospital Account [id number])    Name Relation to [place]? Acct Type   Kendra Simmons Self [facility] Yes Behavioral Health   Address Phone       [address] [place],  [address] [phone])          Coverage Information (for Hospital Account [id number])    F/O Payor/Plan Precert #   MEDICARE/MEDICARE PART A AND B    Subscriber Subscriber #   Kendra Simmons, Kendra Simmons [id number]   Address Phone   [address], [place] [address]       Legal Guardian:     Primary Care Provider:  [name], MD  Current Outpatient Providers:  Kendra Simmons  Psychiatrist:   Dr Kendra Simmons  Counselor/Therapist:   none  Compliant with Medications:  Yes  Additional Information:   Kendra Simmons 12/28/20159:09 PM

## 2014-12-13 NOTE — ED Provider Notes (Signed)
CSN: [id number]     Arrival date & time 12/13/14  1610 History   First MD Initiated Contact with Patient 12/13/14 1712     Chief Complaint  Patient presents with  . depression, hopelessness      (Consider location/radiation/quality/duration/timing/severity/associated sxs/prior Treatment) Patient is a 63 y.o. female presenting with mental health disorder. The history is provided by the patient.  Mental Health Problem Presenting symptoms: depression and suicidal thoughts   Presenting symptoms: no suicidal threats   Degree of incapacity (severity):  Moderate Onset quality:  Gradual Timing:  Constant Progression:  Worsening Chronicity:  Recurrent Context: recent medication change (put on remeron for difficulty sleeping)   Treatment compliance:  All of the time Relieved by:  Nothing Worsened by:  Nothing tried Ineffective treatments:  Antidepressants Associated symptoms: fatigue, feelings of worthlessness and insomnia   Associated symptoms: no hyperventilation   Risk factors: family hx of mental illness     Past Medical History  Diagnosis Date  . High cholesterol   . Depression   . Anxiety   . GERD (gastroesophageal reflux disease)   . Insomnia   . Arthritis    Past Surgical History  Procedure Laterality Date  . Cosmetic surgery    . Cesarean section    . Laproscopy     Family History  Problem Relation Age of Onset  . Sleep apnea Father   . Alcohol abuse Father   . Diabetes Mother   . Diabetes Sister   . Diabetes Maternal Uncle   . Diabetes Cousin    History  Substance Use Topics  . Smoking status: Never Smoker   . Smokeless tobacco: Never Used  . Alcohol Use: No   OB History    No data available     Review of Systems  Constitutional: Positive for fatigue. Negative for fever.  Respiratory: Negative for cough and shortness of breath.   Psychiatric/Behavioral: Positive for suicidal ideas. The patient has insomnia.   All other systems reviewed and are  negative.     Allergies  Review of patient's allergies indicates no known allergies.  Home Medications   Prior to Admission medications   Medication Sig Start Date End Date Taking? Authorizing Provider  buPROPion (WELLBUTRIN XL) 150 MG 24 hr tablet Take 1 tablet (150 mg total) by mouth daily. For depression 11/25/14  Yes [name], MD  clonazePAM (KLONOPIN) 0.5 MG tablet Take 1 tablet (0.5 mg total) by mouth at bedtime. For anxiety/insomnia 11/25/14  Yes [name], MD  cyanocobalamin (,VITAMIN B-12,) 1000 MCG/ML injection Inject 1,000 mcg into the skin once a week.   Yes Historical Provider, MD  Ibuprofen-Diphenhydramine Cit (ADVIL PM PO) Take 0.5 tablets by mouth as needed (pain).   Yes Historical Provider, MD  mirtazapine (REMERON SOL-TAB) 45 MG disintegrating tablet Take 1 tablet (45 mg total) by mouth at bedtime. For depression 11/25/14  Yes [name], MD  olanzapine zydis (ZYPREXA) 20 MG disintegrating tablet Take 1 tablet (20 mg total) by mouth at bedtime. Dose increase 11/25/14  Yes [name], MD  omeprazole (PRILOSEC) 20 MG capsule Take 1 capsule (20 mg total) by mouth daily. For acid reflux 11/15/14  Yes Encarnacion Slates, NP  OVER THE COUNTER MEDICATION Take 1 tablet by mouth daily. Potassium   Yes Historical Provider, MD  polyvinyl alcohol (LIQUIFILM TEARS) 1.4 % ophthalmic solution Place 1 drop into both eyes as needed for dry eyes. 11/15/14  Yes Encarnacion Slates, NP  pravastatin (PRAVACHOL)  20 MG tablet Take 1 tablet (20 mg total) by mouth at bedtime. For high cholesterol 11/15/14  Yes Encarnacion Slates, NP  sertraline (ZOLOFT) 100 MG tablet Take 2 tablets (200 mg total) by mouth daily. For depression 11/25/14  Yes [name], MD  Vitamin D, Ergocalciferol, (DRISDOL) 50000 UNITS CAPS capsule Take 1 capsule (50,000 Units total) by mouth every 7 (seven) days. On Wednesday: For bone health 11/15/14  Yes Encarnacion Slates, NP  tamsulosin (FLOMAX) 0.4 MG CAPS capsule Take 1  capsule (0.4 mg total) by mouth daily. For urinary retension Patient not taking: Reported on 12/13/2014 11/15/14   Encarnacion Slates, NP   BP 131/84 mmHg  Pulse 106  Temp(Src) 98 F (36.7 C) (Oral)  Resp 18  SpO2 99% Physical Exam  Constitutional: She is oriented to person, place, and time. She appears well-developed and well-nourished. No distress.  HENT:  Head: Normocephalic and atraumatic.  Mouth/Throat: Oropharynx is clear and moist.  Eyes: EOM are normal. Pupils are equal, round, and reactive to light.  Neck: Normal range of motion. Neck supple.  Cardiovascular: Normal rate and regular rhythm.  Exam reveals no friction rub.   No murmur heard. Pulmonary/Chest: Effort normal and breath sounds normal. No respiratory distress. She has no wheezes. She has no rales.  Abdominal: Soft. She exhibits no distension. There is no tenderness. There is no rebound.  Musculoskeletal: Normal range of motion. She exhibits no edema.  Neurological: She is alert and oriented to person, place, and time. No cranial nerve deficit. She exhibits normal muscle tone. Coordination normal.  Skin: No rash noted. She is not diaphoretic.  Nursing note and vitals reviewed.   ED Course  Procedures (including critical care time) Labs Review Labs Reviewed  CBC  COMPREHENSIVE METABOLIC PANEL  URINALYSIS, ROUTINE W REFLEX MICROSCOPIC  URINE RAPID DRUG SCREEN (HOSP PERFORMED)  SALICYLATE LEVEL  ACETAMINOPHEN LEVEL  ETHANOL    Imaging Review No results found.   EKG Interpretation None      MDM   Final diagnoses:  Suicidal behavior    20F here with hopelessness, depression. Has vague SI, is followed by Dr. Adele Schilder. No plan for suicide. No Hi, no delusions/hallucinations. States her remeron is not working. TTS consulted.  Psych admitted.  Evelina Bucy, MD 12/14/14 [place]

## 2014-12-13 NOTE — Progress Notes (Signed)
[name] INPATIENT:  Family/Significant Other Suicide Prevention Education  Suicide Prevention Education:  Patient Refusal for Family/Significant Other Suicide Prevention Education: The patient Kendra Simmons has refused to provide written consent for family/significant other to be provided Family/Significant Other Suicide Prevention Education during admission and/or prior to discharge.  Apolinar Junes 12/13/2014, 9:12 PM

## 2014-12-14 DIAGNOSIS — F329 Major depressive disorder, single episode, unspecified: Secondary | ICD-10-CM | POA: Diagnosis not present

## 2014-12-14 DIAGNOSIS — M1990 Unspecified osteoarthritis, unspecified site: Secondary | ICD-10-CM | POA: Diagnosis present

## 2014-12-14 DIAGNOSIS — F332 Major depressive disorder, recurrent severe without psychotic features: Secondary | ICD-10-CM | POA: Diagnosis not present

## 2014-12-14 DIAGNOSIS — G4700 Insomnia, unspecified: Secondary | ICD-10-CM | POA: Diagnosis present

## 2014-12-14 DIAGNOSIS — K219 Gastro-esophageal reflux disease without esophagitis: Secondary | ICD-10-CM | POA: Diagnosis present

## 2014-12-14 DIAGNOSIS — F321 Major depressive disorder, single episode, moderate: Secondary | ICD-10-CM | POA: Diagnosis not present

## 2014-12-14 DIAGNOSIS — E780 Pure hypercholesterolemia: Secondary | ICD-10-CM | POA: Diagnosis present

## 2014-12-14 DIAGNOSIS — F419 Anxiety disorder, unspecified: Secondary | ICD-10-CM | POA: Diagnosis not present

## 2014-12-14 DIAGNOSIS — Z833 Family history of diabetes mellitus: Secondary | ICD-10-CM | POA: Diagnosis not present

## 2014-12-14 DIAGNOSIS — Z609 Problem related to social environment, unspecified: Secondary | ICD-10-CM | POA: Diagnosis present

## 2014-12-14 DIAGNOSIS — Z599 Problem related to housing and economic circumstances, unspecified: Secondary | ICD-10-CM | POA: Diagnosis not present

## 2014-12-14 DIAGNOSIS — R45851 Suicidal ideations: Secondary | ICD-10-CM | POA: Diagnosis not present

## 2014-12-14 DIAGNOSIS — F410 Panic disorder [episodic paroxysmal anxiety] without agoraphobia: Secondary | ICD-10-CM | POA: Diagnosis present

## 2014-12-14 MED ORDER — TRAZODONE HCL 50 MG PO TABS
50.0000 mg | ORAL_TABLET | Freq: Every evening | ORAL | Status: DC | PRN
  Administered 2014-12-14 – 2014-12-16 (×4): 50 mg via ORAL
  Filled 2014-12-14 (×12): qty 1

## 2014-12-14 MED ORDER — LOPERAMIDE HCL 2 MG PO CAPS: 4.0000 mg | ORAL_CAPSULE | ORAL | Status: DC | PRN

## 2014-12-14 MED ORDER — CLONAZEPAM 0.5 MG PO TABS
0.5000 mg | ORAL_TABLET | Freq: Every day | ORAL | Status: DC
  Administered 2014-12-14 – 2014-12-16 (×3): 0.5 mg via ORAL
  Filled 2014-12-14 (×3): qty 1

## 2014-12-14 MED ORDER — MIRTAZAPINE 30 MG PO TBDP
45.0000 mg | ORAL_TABLET | Freq: Every day | ORAL | Status: DC
  Filled 2014-12-14 (×2): qty 1

## 2014-12-14 MED ORDER — PRAVASTATIN SODIUM 20 MG PO TABS
20.0000 mg | ORAL_TABLET | Freq: Every day | ORAL | Status: DC
  Filled 2014-12-14 (×2): qty 1

## 2014-12-14 MED ORDER — PANTOPRAZOLE SODIUM 40 MG PO TBEC
40.0000 mg | DELAYED_RELEASE_TABLET | Freq: Every day | ORAL | Status: DC
  Administered 2014-12-15 – 2014-12-20 (×6): 40 mg via ORAL
  Filled 2014-12-14 (×10): qty 1

## 2014-12-14 MED ORDER — VITAMIN D (ERGOCALCIFEROL) 1.25 MG (50000 UNIT) PO CAPS
50000.0000 [IU] | ORAL_CAPSULE | ORAL | Status: DC
  Administered 2014-12-14: 50000 [IU] via ORAL
  Filled 2014-12-14: qty 1

## 2014-12-14 MED ORDER — ALUM & MAG HYDROXIDE-SIMETH 200-200-20 MG/5ML PO SUSP: 30.0000 mL | ORAL | Status: DC | PRN

## 2014-12-14 MED ORDER — PRAVASTATIN SODIUM 20 MG PO TABS
20.0000 mg | ORAL_TABLET | Freq: Every day | ORAL | Status: DC
  Administered 2014-12-14 – 2014-12-19 (×6): 20 mg via ORAL
  Filled 2014-12-14 (×8): qty 1

## 2014-12-14 MED ORDER — LOPERAMIDE HCL 2 MG PO CAPS
4.0000 mg | ORAL_CAPSULE | ORAL | Status: DC | PRN
  Administered 2014-12-14: 4 mg via ORAL
  Filled 2014-12-14: qty 2

## 2014-12-14 MED ORDER — VITAMIN D (ERGOCALCIFEROL) 1.25 MG (50000 UNIT) PO CAPS
50000.0000 [IU] | ORAL_CAPSULE | ORAL | Status: DC
  Filled 2014-12-14: qty 1

## 2014-12-14 MED ORDER — ACETAMINOPHEN 325 MG PO TABS
650.0000 mg | ORAL_TABLET | Freq: Four times a day (QID) | ORAL | Status: DC | PRN
  Administered 2014-12-14 – 2014-12-20 (×6): 650 mg via ORAL
  Filled 2014-12-14 (×6): qty 2

## 2014-12-14 MED ORDER — CYANOCOBALAMIN 1000 MCG/ML IJ SOLN
1000.0000 ug | INTRAMUSCULAR | Status: DC
  Administered 2014-12-14: 1000 ug via SUBCUTANEOUS
  Filled 2014-12-14: qty 1

## 2014-12-14 MED ORDER — MIRTAZAPINE 30 MG PO TBDP
45.0000 mg | ORAL_TABLET | Freq: Every day | ORAL | Status: DC
  Administered 2014-12-14: 45 mg via ORAL
  Filled 2014-12-14 (×3): qty 1

## 2014-12-14 MED ORDER — OLANZAPINE 10 MG PO TBDP
20.0000 mg | ORAL_TABLET | Freq: Every day | ORAL | Status: DC
  Administered 2014-12-14: 20 mg via ORAL
  Filled 2014-12-14 (×4): qty 2

## 2014-12-14 MED ORDER — PANTOPRAZOLE SODIUM 40 MG PO TBEC
40.0000 mg | DELAYED_RELEASE_TABLET | Freq: Every day | ORAL | Status: DC
  Administered 2014-12-14: 40 mg via ORAL
  Filled 2014-12-14 (×3): qty 1

## 2014-12-14 MED ORDER — OLANZAPINE 10 MG PO TBDP
20.0000 mg | ORAL_TABLET | Freq: Every day | ORAL | Status: DC
  Filled 2014-12-14 (×2): qty 2

## 2014-12-14 MED ORDER — CYANOCOBALAMIN 1000 MCG/ML IJ SOLN
1000.0000 ug | INTRAMUSCULAR | Status: DC
  Filled 2014-12-14: qty 1

## 2014-12-14 MED ORDER — MAGNESIUM HYDROXIDE 400 MG/5ML PO SUSP: 30.0000 mL | Freq: Every day | ORAL | Status: DC | PRN

## 2014-12-14 MED ORDER — CLONAZEPAM 0.5 MG PO TABS: 0.5000 mg | ORAL_TABLET | Freq: Every day | ORAL | Status: DC

## 2014-12-14 NOTE — Plan of Care (Signed)
Problem: Ineffective individual coping Goal: STG: Patient will remain free from self harm Outcome: Progressing Pt safe on the unit     

## 2014-12-14 NOTE — BH Assessment (Signed)
Consulted with Shelly Eisbach,NP whom is recommending inpatient treatment at [facility].   Shaune Pollack, MS, [place] Assessment Counselor

## 2014-12-14 NOTE — Progress Notes (Signed)
Patient ID: Kendra Simmons, female   DOB: July 09, 1951, 63 y.o.   MRN: [id number] Patient sleeping all am after not sleeping well last PM. Safety maintained.

## 2014-12-14 NOTE — Progress Notes (Signed)
D: Pt denies SI/HI/AVH. Pt is pleasant and cooperative. Pt stated she was not sleeping, pt wanted writer to call provider to give her something stronger to sleep. Pt encouraged to notify dr in the morning, since pt has not had one night here. Pt had conflict with roommate, roommate was keeping pt up. Pt moved to quiet room, pt complained of the [facility] unit making noise, so it was too loud for her.   A: Pt was offered support and encouragement. Pt was given scheduled medications. Pt was encourage to attend groups. Q 15 minute checks were done for safety.   R:Pt attends groups and interacts well with peers and staff. Pt is taking medication.Pt receptive to treatment and safety maintained on unit.

## 2014-12-14 NOTE — BHH Group Notes (Signed)
wAdult Psychoeducational Group Note  Date:  12/14/2014 Time:  9:57 PM  Group Topic/Focus:  Wrap-Up Group:   The focus of this group is to help patients review their daily goal of treatment and discuss progress on daily workbooks.  Participation Level:  Active  Participation Quality:  Appropriate  Affect:  Flat  Cognitive:  Alert, Appropriate and Oriented  Insight: Improving  Engagement in Group:  Developing/Improving  Modes of Intervention:  Discussion and Support  Additional Comments:  Pt stated that her goal for today was to get out of the observation unit. Pt continued by stating that her neck now hurts due to the chair that she had to sleep in while in the observation unit and that she did not get much sleep due to another pt on the observation unit snoring. Pt rated her day a 4 out of 10.   [name] P 12/14/2014, 9:57 PM
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
## 2014-12-14 NOTE — Progress Notes (Signed)
Patient ID: Kendra Simmons, female   DOB: 04/20/1951, 63 y.o.   MRN: [id number]  Patient is a 63yr old voluntary admission from OBS unit. Presently has Dr. Adele Schilder as an outpatient doctor. Reports increased depression with some SI. Her main concern is her lack of sleep and hopes that going inpatient will let the doctors work on her medications. Has had frequent trips to ED due to insomnia. Hospitalized at numerous facilities in the past. Patient has been cooperative while in the OBS unit.

## 2014-12-15 ENCOUNTER — Encounter (HOSPITAL_COMMUNITY): Payer: Self-pay | Admitting: Psychiatry

## 2014-12-15 DIAGNOSIS — G4700 Insomnia, unspecified: Secondary | ICD-10-CM | POA: Diagnosis present

## 2014-12-15 DIAGNOSIS — F329 Major depressive disorder, single episode, unspecified: Secondary | ICD-10-CM

## 2014-12-15 MED ORDER — MIRTAZAPINE 30 MG PO TBDP
30.0000 mg | ORAL_TABLET | Freq: Every day | ORAL | Status: DC
  Administered 2014-12-15 – 2014-12-19 (×5): 30 mg via ORAL
  Filled 2014-12-15 (×2): qty 1
  Filled 2014-12-15: qty 4
  Filled 2014-12-15 (×4): qty 1

## 2014-12-15 MED ORDER — OLANZAPINE 10 MG PO TBDP
30.0000 mg | ORAL_TABLET | Freq: Every day | ORAL | Status: DC
  Administered 2014-12-15 – 2014-12-16 (×2): 30 mg via ORAL
  Filled 2014-12-15 (×4): qty 3

## 2014-12-15 MED ORDER — POLYVINYL ALCOHOL 1.4 % OP SOLN
1.0000 [drp] | Freq: Four times a day (QID) | OPHTHALMIC | Status: DC
  Administered 2014-12-16 – 2014-12-19 (×6): 1 [drp] via OPHTHALMIC
  Filled 2014-12-15 (×2): qty 15

## 2014-12-15 MED ORDER — ENSURE COMPLETE PO LIQD: 237.0000 mL | Freq: Two times a day (BID) | ORAL | Status: DC

## 2014-12-15 NOTE — Progress Notes (Signed)
D: Patient presents with blunted, depressed affect and mood. She complained of not sleeping last night; per the evening RN staff, patient was offered to rest in the quiet room or to be given ear plugs, but declined both options. Also, pt. reported that she has dry eyes as well; NP Herbert Pun, placed order for medication, but pt. refused the liquid eye drops this evening. Writer observed that the patient has been sitting in the dayroom all day conversing with peers and watching television.  A: Support and encouragement provided to patient. Scheduled medications given to patient per MD orders. Maintain Q15 minute checks for safety.  R: Patient receptive. Denies SI/HI and AVH. Patient remains safe on the hall.

## 2014-12-15 NOTE — Progress Notes (Signed)
D: Pt denies SI/HI/AVH.  Pt continues to be very pessimistic and negative. Pt continues to have blunted affect and states that nothing is going to help he sleep. Pt informed the reduction in one of her medications has a better sedative effect, but pt did not seem interested.   A: Pt was offered support and encouragement. Pt was given scheduled medications. Pt was encourage to attend groups. Q 15 minute checks were done for safety.   R:Pt  interacts well with peers and staff. Pt is taking medication.Pt receptive to treatment and safety maintained on unit.

## 2014-12-15 NOTE — Progress Notes (Signed)
NUTRITION ASSESSMENT  Pt identified as at risk on the Malnutrition Screen Tool  INTERVENTION: 1. Educated patient on the importance of nutrition and encouraged intake of food and beverages. 2. Discussed weight goals. 3. Supplements: Ensure Complete po BID, each supplement provides 350 kcal and 13 grams of protein  NUTRITION DIAGNOSIS: Unintentional weight loss related to sub-optimal intake as evidenced by pt report.   Goal: Pt to meet >/= 90% of their estimated nutrition needs.  Monitor:  PO intake  Assessment:  Pt admitted with depression and SI. Pt was seen 11/18 by RD. Diagnosed with severe malnutrition.  Since last admission to [facility], pt has gained 4 lbs. Pt with overall weight loss of 27 lb since August (14% weight loss x 4 months, significant for time frame).  RD to order Ensure supplements BID.  Height: Ht Readings from Last 1 Encounters:  12/13/14 5\' 4"  (1.626 m)    Weight: Wt Readings from Last 1 Encounters:  12/13/14 160 lb (72.576 kg)    Weight Hx: Wt Readings from Last 10 Encounters:  12/13/14 160 lb (72.576 kg)  11/25/14 159 lb 12.8 oz (72.485 kg)  11/09/14 160 lb (72.576 kg)  11/02/14 156 lb (70.761 kg)  10/15/14 170 lb (77.111 kg)  10/13/14 170 lb (77.111 kg)  10/05/14 172 lb (78.019 kg)  09/07/14 172 lb (78.019 kg)  08/27/14 178 lb (80.74 kg)  08/11/14 187 lb (84.823 kg)    BMI:  Body mass index is 27.45 kg/(m^2). Pt meets criteria for overweight based on current BMI.  Estimated Nutritional Needs: Kcal: 25-30 kcal/kg Protein: > 1 gram protein/kg Fluid: 1 ml/kcal  Diet Order: Diet regular Pt is also offered choice of unit snacks mid-morning and mid-afternoon.  Pt is eating as desired.   Lab results and medications reviewed.   Clayton Bibles, MS, RD, LDN Pager: [phone] After Hours Pager: [phone]

## 2014-12-15 NOTE — [facility] Suicide Risk Assessment (Signed)
Suicide Risk Assessment  Admission Assessment     Nursing information obtained from:  Patient Demographic factors:  Divorced or widowed, Caucasian, Low socioeconomic status, Living alone Current Mental Status:  Suicidal ideation indicated by patient, Self-harm thoughts Loss Factors:  Decline in physical health, Financial problems / change in socioeconomic status Historical Factors:  Family history of mental illness or substance abuse Risk Reduction Factors:  Religious beliefs about death Total Time spent with patient: 30 minutes  CLINICAL FACTORS:   Depression:   Insomnia Severe  Psychiatric Specialty Exam:     Blood pressure 113/81, pulse 133, temperature 98 F (36.7 C), temperature source Oral, resp. rate 17, height 5\' 4"  (1.626 m), weight 72.576 kg (160 lb).Body mass index is 27.45 kg/(m^2).  General Appearance: Fairly Groomed  Engineer, water::  Fair  Speech:  Clear and Coherent, Slow and not spontaneous  Volume:  Decreased  Mood:  Depressed  Affect:  Restricted  Thought Process:  Coherent and Goal Directed  Orientation:  Full (Time, Place, and Person)  Thought Content:  symptoms events worries concerns. Ruminations: somatically focused  Suicidal Thoughts:  Yes with no intent or plan  Homicidal Thoughts:  No  Memory:  Immediate;   Fair Recent;   Fair Remote;   Fair  Judgement:  Fair  Insight:  Present and Shallow  Psychomotor Activity:  Decreased  Concentration:  Fair  Recall:  [employer] of [place]: Fair  Akathisia:  No  Handed:    AIMS (if indicated):     Assets:  Desire for Improvement Housing Social Support  Sleep:  Number of Hours: 4   Musculoskeletal: Strength & Muscle Tone: within normal limits Gait & Station: normal Patient leans: N/A  COGNITIVE FEATURES THAT CONTRIBUTE TO RISK:  Closed-mindedness Polarized thinking Thought constriction (tunnel vision)    SUICIDE RISK:   Moderate:  Frequent suicidal ideation with limited intensity,  and duration, some specificity in terms of plans, no associated intent, good self-control, limited dysphoria/symptomatology, some risk factors present, and identifiable protective factors, including available and accessible social support.  PLAN OF CARE: Supportive approach/coping skills                               Depression: continue the Remeron but decrease the dose to 30 mg (the                               lower dosages tend to be more sedating )                               Insomnia ( and depression) : continue the Zyprexa and on a trial basis                                            increase to 30 mg HS ( she wants to try to increase it)                               Eye pain: evaluate further and address I certify that inpatient services furnished can reasonably be expected to improve the patient's condition.  Shereese Bonnie A 12/15/2014, 5:19 PM
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
## 2014-12-15 NOTE — BHH Group Notes (Signed)
Adult Psychoeducational Group Note  Date:  12/15/2014 Time:  10:04 PM  Group Topic/Focus:  Wrap-Up Group:   The focus of this group is to help patients review their daily goal of treatment and discuss progress on daily workbooks.  Participation Level:  Active   Additional Comments:  Tonight's group was NA group and this pt did attend.   [name] P 12/15/2014, 10:04 PM

## 2014-12-15 NOTE — BHH Group Notes (Signed)
[facility] LCSW Group Therapy  Emotional Regulation 1:15 - 2: 30 PM        12/15/2014  4:26 PM    Type of Therapy:  Group Therapy  Participation Level:  Minimal  Participation Quality:  Appropriate  Affect:  Depressed, Flat  Cognitive:  Appropriate  Insight:  Developing/Improving   Engagement in Therapy:  Developing/Improving  Modes of Intervention:  Discussion Exploration Problem-Solving Supportive  Summary of Progress/Problems:  Group topic was emotional regulations.  Patient did not engage in the discussion. [name] 12/15/2014 4:26 PM

## 2014-12-15 NOTE — Progress Notes (Addendum)
Patient ID: Kendra Simmons, female   DOB: [birth year], 63 y.o.   MRN: [id number] PER STATE REGULATIONS 482.30  THIS CHART WAS REVIEWED FOR MEDICAL NECESSITY WITH RESPECT TO THE PATIENT'S ADMISSION/DURATION OF STAY.  NEXT REVIEW DATE: 12/17/2014  Roma Schanz, RN, BSN CASE MANAGER
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
## 2014-12-15 NOTE — BHH Group Notes (Signed)
[facility] LCSW Aftercare Discharge Planning Group Note   12/15/2014 12:16 PM    Participation Quality:  Appropraite  Mood/Affect:  Appropriate  Depression Rating:  5  Anxiety Rating:  5  Thoughts of Suicide:  No  Will you contract for safety?   NA  Current AVH:  No  Plan for Discharge/Comments:  Patient attended discharge planning group and actively participated in group. Patient will return to her home and follow up with [facility].  Suicide prevention education reviewed and SPE document provided.   Transportation Means: Patient has transportation.   Supports:  Patient has a support system.   Jeremian Whitby, Eulas Post

## 2014-12-16 DIAGNOSIS — F321 Major depressive disorder, single episode, moderate: Secondary | ICD-10-CM

## 2014-12-16 NOTE — BHH Group Notes (Signed)
Adult Psychoeducational Group Note  Date:  12/16/2014 Time:  10:20 PM  Group Topic/Focus:  Wrap-Up Group:   The focus of this group is to help patients review their daily goal of treatment and discuss progress on daily workbooks.  Participation Level:  Minimal  Participation Quality:  Attentive  Affect:  Flat Depressed  Cognitive:  Appropriate  Insight: Limited  Engagement in Group:  Limited  Modes of Intervention:  Discussion  Additional Comments:  Kendra Simmons stated her day was so, so.  She was feeling hopeless and her eyes haven't bothered her today.  She stated she wanted things to be better in the new year.  [name] A 12/16/2014, 10:20 PM

## 2014-12-16 NOTE — Progress Notes (Signed)
[facility] MD Progress Note  12/16/2014 3:53 PM [name]  MRN:  [id number] Subjective:  Kendra Simmons endorses that she slept better last night. She states that when she does not sleep she does not do well, gets more depressed and suicidal. She states that her eyes have not hurt "yet." states she does have some pain when she urinates as well as frequency and urgency. Wonders if she has an UTI as she has had them before.  Diagnosis:   DSM5: Depressive Disorders:  Major Depressive Disorder - Moderate (296.22) Total Time spent with patient: 30 minutes  Axis I: Anxiety Disorder NOS  ADL's:  Intact  Sleep: better last night  Appetite:  Fair  Psychiatric Specialty Exam: Physical Exam  Review of Systems  HENT: Negative.   Eyes: Negative.   Respiratory: Negative.   Cardiovascular: Negative.   Gastrointestinal: Negative.   Genitourinary: Positive for dysuria.  Musculoskeletal: Negative.   Skin: Negative.   Neurological: Positive for weakness.  Endo/Heme/Allergies: Negative.   Psychiatric/Behavioral: Positive for depression. The patient is nervous/anxious and has insomnia.     Blood pressure 98/68, pulse 96, temperature 97.8 F (36.6 C), temperature source Oral, resp. rate 20, height 5\' 4"  (1.626 m), weight 72.576 kg (160 lb).Body mass index is 27.45 kg/(m^2).  General Appearance: Fairly Groomed  Engineer, water::  Fair  Speech:  Clear and Coherent, Slow and not spontaneous  Volume:  Decreased  Mood:  Depressed  Affect:  Restricted  Thought Process:  Coherent and Goal Directed  Orientation:  Full (Time, Place, and Person)  Thought Content:  symptoms worries concerns  Suicidal Thoughts:  intermittent  Homicidal Thoughts:  No  Memory:  Immediate;   Fair Recent;   Fair Remote;   Fair  Judgement:  Fair  Insight:  Shallow  Psychomotor Activity:  Restlessness  Concentration:  Fair  Recall:  [place]: Fair  Akathisia:  No  Handed:    AIMS (if indicated):      Assets:  Desire for Improvement Housing  Sleep:  Number of Hours: 6.75   Musculoskeletal: Strength & Muscle Tone: within normal limits Gait & Station: normal Patient leans: N/A  Current Medications: Current Facility-Administered Medications  Medication Dose Route Frequency Provider Last Rate Last Dose  . acetaminophen (TYLENOL) tablet 650 mg  650 mg Oral Q6H PRN Kennedy Bucker, NP   650 mg at 12/16/14 0903  . alum & mag hydroxide-simeth (MAALOX/MYLANTA) 200-200-20 MG/5ML suspension 30 mL  30 mL Oral Q4H PRN Kennedy Bucker, NP      . clonazePAM (KLONOPIN) tablet 0.5 mg  0.5 mg Oral QHS Kennedy Bucker, NP   0.5 mg at 12/15/14 2214  . [START ON 12/21/2014] cyanocobalamin ((VITAMIN B-12)) injection 1,000 mcg  1,000 mcg Subcutaneous Weekly Kennedy Bucker, NP      . feeding supplement (ENSURE COMPLETE) (ENSURE COMPLETE) liquid 237 mL  237 mL Oral BID BM Clayton Bibles, RD   237 mL at 12/15/14 1115  . loperamide (IMODIUM) capsule 4 mg  4 mg Oral PRN Kennedy Bucker, NP      . magnesium hydroxide (MILK OF MAGNESIA) suspension 30 mL  30 mL Oral Daily PRN Kennedy Bucker, NP      . mirtazapine (REMERON SOL-TAB) disintegrating tablet 30 mg  30 mg Oral QHS Encarnacion Slates, NP   30 mg at 12/15/14 2219  . OLANZapine zydis (ZYPREXA) disintegrating tablet 30 mg  30 mg Oral QHS Encarnacion Slates, NP   30 mg at 12/15/14  2219  . pantoprazole (PROTONIX) EC tablet 40 mg  40 mg Oral Daily Kennedy Bucker, NP   40 mg at 12/16/14 0831  . polyvinyl alcohol (LIQUIFILM TEARS) 1.4 % ophthalmic solution 1 drop  1 drop Both Eyes QID Encarnacion Slates, NP   1 drop at 12/16/14 0831  . pravastatin (PRAVACHOL) tablet 20 mg  20 mg Oral QHS Kennedy Bucker, NP   20 mg at 12/15/14 2214  . traZODone (DESYREL) tablet 50 mg  50 mg Oral QHS,MR X 1 Kennedy Bucker, NP   50 mg at 12/15/14 2214  . [START ON 12/21/2014] Vitamin D (Ergocalciferol) (DRISDOL) capsule 50,000 Units  50,000 Units Oral Q7 days Kennedy Bucker, NP        Lab Results: No  results found for this or any previous visit (from the past 48 hour(s)).  Physical Findings: AIMS: Facial and Oral Movements Muscles of Facial Expression: None, normal Lips and Perioral Area: None, normal Jaw: None, normal Tongue: None, normal,Extremity Movements Upper (arms, wrists, hands, fingers): None, normal Lower (legs, knees, ankles, toes): None, normal, Trunk Movements Neck, shoulders, hips: None, normal, Overall Severity Severity of abnormal movements (highest score from questions above): None, normal Incapacitation due to abnormal movements: None, normal Patient's awareness of abnormal movements (rate only patient's report): No Awareness, Dental Status Current problems with teeth and/or dentures?: No Does patient usually wear dentures?: No  CIWA:    COWS:     Treatment Plan Summary: Daily contact with patient to assess and evaluate symptoms and progress in treatment Medication management  Plan: Supportive approach/coping skills           Depression; will continue the Remeron with Zyprexa augmentation           Insomnia; will continue the trial with the higher dose of Zyprexa and reassess           Urinary symptoms: will repeat the U/A and reassess for the presence of an UTI Medical Decision Making Problem Points:  Review of psycho-social stressors (1) Data Points:  Review or order clinical lab tests (1) Review of medication regiment & side effects (2)  I certify that inpatient services furnished can reasonably be expected to improve the patient's condition.   [name] A 12/16/2014, 3:53 PM

## 2014-12-16 NOTE — Progress Notes (Signed)
Patient ID: Kendra Simmons, female   DOB: [birth year], 63 y.o.   MRN: [id number]  Urinalysis was ordered by provider and pt asked to collect sample. Pt tried to void but could not produce a sample. Specimen cup left in patients room. Pt instructed to complete urine specimen when able and notify staff.

## 2014-12-16 NOTE — BHH Group Notes (Signed)
[place] LCSW Group Therapy 12/16/2014 1:15 PM Type of Therapy: Group Therapy Participation Level: Active  Participation Quality: Attentive, Sharing and Supportive  Affect: Depressed and Flat  Cognitive: Alert and Oriented  Insight: Developing/Improving and Engaged  Engagement in Therapy: Developing/Improving and Engaged  Modes of Intervention: Activity, Clarification, Confrontation, Discussion, Education, Exploration, Limit-setting, Orientation, Problem-solving, Rapport Building, Art therapist, Socialization and Support  Summary of Progress/Problems: Patient was attentive and engaged with speaker from [place]. Patient was attentive to speaker while they shared their story of dealing with mental health and overcoming it. Patient expressed interest in their programs and services and received information on their agency. Patient processed ways they can relate to the speaker.   Tilden Fossa, MSW, [place] Worker [facility] [place] [phone]

## 2014-12-16 NOTE — BHH Group Notes (Signed)
[place] Group Notes:  (Nursing/MHT/Case Management/Adjunct)  Date:  12/16/2014  Time:  9:00am  Type of Therapy:  Nurse Education  Participation Level:  Minimal  Participation Quality:  Drowsy  Affect:  Flat  Cognitive:  Oriented  Insight:  Improving  Engagement in Group:  Improving  Modes of Intervention:  Discussion, Education and Support  Summary of Progress/Problems: Patient attended group however stared off at times. She did share when called on. Participated in deep breathing exercise. Shared her short term goal is to "keep eyes clear today" and her long term goal is to develop a plan for when she's home to increase hopefulness.   Jamie Kato 12/16/2014, 10:15 AM

## 2014-12-16 NOTE — Progress Notes (Signed)
Patient ID: Kendra Simmons, female   DOB: Jul 22, 1951, 63 y.o.   MRN: [id number]   Pt currently presents with a flat affect and depressed behavior. Pt has been sleeping throughout the day in the dayroom. Pt reports good sleep last night. Pt reports depression and thoughts of hopeless.  Pt provided with medications per providers orders. Pt's labs and vitals were monitored throughout the day. Pt supported emotionally and encouraged to express concerns and questions. Pt educated on medications and setting goals. 1:1 provided with Probation officer.  Pt's safety ensured with 15 minute and environmental checks. Pt currently denies SI/HI and A/V hallucinations. Pt verbally agrees to seek staff if SI/HI or A/VH occurs and to consult with staff before acting on these thoughts.

## 2014-12-17 DIAGNOSIS — R45851 Suicidal ideations: Secondary | ICD-10-CM

## 2014-12-17 DIAGNOSIS — F419 Anxiety disorder, unspecified: Secondary | ICD-10-CM

## 2014-12-17 MED ORDER — TRAZODONE HCL 100 MG PO TABS
100.0000 mg | ORAL_TABLET | Freq: Every day | ORAL | Status: DC
  Administered 2014-12-17 – 2014-12-19 (×3): 100 mg via ORAL
  Filled 2014-12-17 (×2): qty 1
  Filled 2014-12-17: qty 4
  Filled 2014-12-17 (×2): qty 1

## 2014-12-17 MED ORDER — TAMSULOSIN HCL 0.4 MG PO CAPS
0.4000 mg | ORAL_CAPSULE | Freq: Every day | ORAL | Status: DC
Start: 2014-12-17 — End: 2014-12-20
  Administered 2014-12-17 – 2014-12-19 (×3): 0.4 mg via ORAL
  Filled 2014-12-17 (×6): qty 1

## 2014-12-17 MED ORDER — OLANZAPINE 5 MG PO TBDP
15.0000 mg | ORAL_TABLET | Freq: Every day | ORAL | Status: DC
  Administered 2014-12-17 – 2014-12-19 (×3): 15 mg via ORAL
  Filled 2014-12-17 (×5): qty 1

## 2014-12-17 MED ORDER — CLONAZEPAM 0.5 MG PO TABS
0.5000 mg | ORAL_TABLET | Freq: Two times a day (BID) | ORAL | Status: DC
  Administered 2014-12-17 – 2014-12-20 (×6): 0.5 mg via ORAL
  Filled 2014-12-17 (×6): qty 1

## 2014-12-17 NOTE — Plan of Care (Signed)
Problem: Consults Goal: Depression Patient Education See Patient Education Module for education specifics.  Outcome: Completed/Met Date Met:  12/17/14 Nurse discussed depression with patient.

## 2014-12-17 NOTE — BHH Group Notes (Signed)
Adult Psychoeducational Group Note  Date:  12/17/2014 Time:  9:54 PM  Group Topic/Focus:  AA Meeting  Participation Level:  Minimal  Participation Quality:  Attentive  Affect:  Flat  Cognitive:  Alert  Insight: Limited  Engagement in Group:  Limited  Modes of Intervention:  Discussion and Education  Additional Comments:  Kendra Simmons attended group.  [name] A 12/17/2014, 9:54 PM

## 2014-12-17 NOTE — BHH Group Notes (Signed)
[facility] LCSW Aftercare Discharge Planning Group Note   12/17/2014 8:45 AM  Participation Quality:  Alert, Appropriate and Oriented  Mood/Affect:  Flat and Depressed  Depression Rating:  5  Anxiety Rating:  5  Thoughts of Suicide:  Pt denies SI/HI  Will you contract for safety?   Yes  Current AVH:  Pt denies  Plan for Discharge/Comments:  Pt attended discharge planning group and actively participated in group.  CSW provided pt with today's workbook.  Pt states that she is "not good" today.  Pt states that she continues to have poor sleep and her eyes are very painful.  Pt states that she feels hopeless about this.  Pt plans to return home in [place] and follow up with [facility].  No further needs voiced by pt at this time.    Transportation Means: Pt reports access to transportation  Supports: No supports mentioned at this time  Regan Lemming, LCSW 12/17/2014 10:18 AM

## 2014-12-17 NOTE — Progress Notes (Signed)
Patient ID: Kendra Simmons, female   DOB: [birth year], 64 y.o.   MRN: [id number] [facility] MD Progress Note  12/17/2014 1:44 PM Kendra Simmons  MRN:  [id number] Subjective: Patient seen and chart reviewed. She continues to report difficulty initiating or staying asleep despite being on numerous medications. She reports increased worries, depression and anxiety due to her inability to sleep. Patient reports that she has been urinating more frequently and has been having  Urgency with dribblings. She states that Flomax helped her in the past. Patient denies suicidal thoughts, delusional thinking or psychosis. She states that she has been scheduled for sleep studies by her PCP.  Diagnosis:   DSM5: Depressive Disorders:  Major Depressive Disorder - Moderate (296.22) Total Time spent with patient: 25 minutes  Axis I: Anxiety Disorder NOS  ADL's:  Intact  Sleep: better last night  Appetite:  Fair  Psychiatric Specialty Exam: Physical Exam  Review of Systems  HENT: Negative.   Eyes: Negative.   Respiratory: Negative.   Cardiovascular: Negative.   Gastrointestinal: Negative.   Genitourinary: Positive for dysuria.  Musculoskeletal: Negative.   Skin: Negative.   Neurological: Positive for weakness.  Endo/Heme/Allergies: Negative.   Psychiatric/Behavioral: Positive for depression. The patient is nervous/anxious and has insomnia.     Blood pressure 84/56, pulse 110, temperature 97.7 F (36.5 C), temperature source Oral, resp. rate 16, height 5\' 4"  (1.626 m), weight 72.576 kg (160 lb).Body mass index is 27.45 kg/(m^2).  General Appearance: Fairly Groomed  Engineer, water::  Fair  Speech:  Clear and Coherent, Slow and not spontaneous  Volume:  Decreased  Mood:  Depressed  Affect:  Restricted  Thought Process:  Coherent and Goal Directed  Orientation:  Full (Time, Place, and Person)  Thought Content:  symptoms worries concerns  Suicidal Thoughts:  intermittent  Homicidal Thoughts:  No   Memory:  Immediate;   Fair Recent;   Fair Remote;   Fair  Judgement:  Fair  Insight:  Shallow  Psychomotor Activity:  Restlessness  Concentration:  Fair  Recall:  [place]: Fair  Akathisia:  No  Handed:    AIMS (if indicated):     Assets:  Desire for Improvement Housing  Sleep:  Number of Hours: 6.75   Musculoskeletal: Strength & Muscle Tone: within normal limits Gait & Station: normal Patient leans: N/A  Current Medications: Current Facility-Administered Medications  Medication Dose Route Frequency Provider Last Rate Last Dose  . acetaminophen (TYLENOL) tablet 650 mg  650 mg Oral Q6H PRN Kennedy Bucker, NP   650 mg at 12/16/14 0903  . alum & mag hydroxide-simeth (MAALOX/MYLANTA) 200-200-20 MG/5ML suspension 30 mL  30 mL Oral Q4H PRN Kennedy Bucker, NP      . clonazePAM (KLONOPIN) tablet 0.5 mg  0.5 mg Oral BID Marykate Heuberger      . [START ON 12/21/2014] cyanocobalamin ((VITAMIN B-12)) injection 1,000 mcg  1,000 mcg Subcutaneous Weekly Kennedy Bucker, NP      . feeding supplement (ENSURE COMPLETE) (ENSURE COMPLETE) liquid 237 mL  237 mL Oral BID BM Clayton Bibles, RD   237 mL at 12/15/14 1115  . loperamide (IMODIUM) capsule 4 mg  4 mg Oral PRN Kennedy Bucker, NP      . magnesium hydroxide (MILK OF MAGNESIA) suspension 30 mL  30 mL Oral Daily PRN Kennedy Bucker, NP      . mirtazapine (REMERON SOL-TAB) disintegrating tablet 30 mg  30 mg Oral QHS Encarnacion Slates, NP   30  mg at 12/16/14 2210  . OLANZapine zydis (ZYPREXA) disintegrating tablet 15 mg  15 mg Oral QHS Kortny Lirette      . pantoprazole (PROTONIX) EC tablet 40 mg  40 mg Oral Daily Kennedy Bucker, NP   40 mg at 12/17/14 0759  . polyvinyl alcohol (LIQUIFILM TEARS) 1.4 % ophthalmic solution 1 drop  1 drop Both Eyes QID Encarnacion Slates, NP   1 drop at 12/17/14 1117  . pravastatin (PRAVACHOL) tablet 20 mg  20 mg Oral QHS Kennedy Bucker, NP   20 mg at 12/16/14 2209  . traZODone (DESYREL) tablet 100 mg   100 mg Oral QHS Lavern Maslow      . [START ON 12/21/2014] Vitamin D (Ergocalciferol) (DRISDOL) capsule 50,000 Units  50,000 Units Oral Q7 days Kennedy Bucker, NP        Lab Results: No results found for this or any previous visit (from the past 48 hour(s)).  Physical Findings: AIMS: Facial and Oral Movements Muscles of Facial Expression: None, normal Lips and Perioral Area: None, normal Jaw: None, normal Tongue: None, normal,Extremity Movements Upper (arms, wrists, hands, fingers): None, normal Lower (legs, knees, ankles, toes): None, normal, Trunk Movements Neck, shoulders, hips: None, normal, Overall Severity Severity of abnormal movements (highest score from questions above): None, normal Incapacitation due to abnormal movements: None, normal Patient's awareness of abnormal movements (rate only patient's report): No Awareness, Dental Status Current problems with teeth and/or dentures?: No Does patient usually wear dentures?: No  CIWA:    COWS:     Treatment Plan Summary: Daily contact with patient to assess and evaluate symptoms and progress in treatment Medication management  Plan: Supportive approach/coping skills           Depression; will continue the Remeron with Zyprexa augmentation            Anxiety: Will increase Clonazepam to 0.5mg  bid           Insomnia; will continue Zyprexa,  and Trazodone. Recommends sleep studies after discharge.           Urinary symptoms: will repeat the U/A and reassess for the presence of an UTI Medical Decision Making Problem Points:  Review of psycho-social stressors (1) Data Points:  Review or order clinical lab tests (1) Review of medication regiment & side effects (2)  I certify that inpatient services furnished can reasonably be expected to improve the patient's condition.   Corena Pilgrim, MD 12/17/2014, 1:44 PM

## 2014-12-17 NOTE — Progress Notes (Signed)
D:  Patient's self inventory sheet, patient had poor sleep last night, sleep medication was not helpful.  Fair appetite, low energy level, poor concentration.  Rated depression and anxiety 5, hopeless 10.  Denied withdrawals.  Denied SI.  Eyes continue to be painful, using eye drops.  Goal is to get through today.  Will continue trying to get through the day.  No discharge plans.  No problems anticipated after discharge. A:  Medications administered per MD orders.  Emotional support and encouragement given patient. R:  Denied SI and HI, contracts for safety.  Denied A/V hallucinations.  Safety maintained with 15 minute checks. UA specimen container given to patient.  Patient had very small amount of urine in container.  Gave patient new UA container.

## 2014-12-17 NOTE — Tx Team (Signed)
Interdisciplinary Treatment Plan Update (Adult)  Date: 12/17/2014  Time Reviewed:  9:45 AM  Progress in Treatment: Attending groups: Yes Participating in groups:  Yes Taking medication as prescribed:  Yes Tolerating medication:  Yes Family/Significant othe contact made: Yes, with sister Patient understands diagnosis:  Yes Discussing patient identified problems/goals with staff:  Yes Medical problems stabilized or resolved:  Yes Denies suicidal/homicidal ideation: Yes Issues/concerns per patient self-inventory:  Yes Other:  New problem(s) identified: complaints of poor sleep and eye pain.    Discharge Plan or Barriers: Pt will return home and follow up with Dr. Adele Schilder in [facility] for medication management.    Reason for Continuation of Hospitalization: Anxiety Depression Medication Stabilization  Comments: N/A  Estimated length of stay: 3-4 days  For review of initial/current patient goals, please see plan of care.  Attendees: Patient:     Family:     Physician:  Dr. Corena Pilgrim 12/17/2014 10:25 AM   Nursing:   Loletta Specter, RN 12/17/2014 10:25 AM   Clinical Social Worker:  Regan Lemming, LCSW 12/17/2014 10:25 AM   Other: Grayland Ormond, RN 12/17/2014 10:25 AM   Other:  Ripley Fraise, LCSW 12/17/2014 10:26 AM   Other:     Other:     Other:    Other:    Other:    Other:    Other:    Other:     Scribe for Treatment Team:   Ane Payment, 12/17/2014 10:25 AM

## 2014-12-17 NOTE — Progress Notes (Signed)
Patient ID: Kendra Simmons, female   DOB: 02/02/1951, 64 y.o.   MRN: [id number]   D: When asked about her day pt stated, "It was all right. My eyes didn't hurt today that's one positive thing." Pt stated she's just trying to get her sleep medications, because that will help her.   A:  Support and encouragement was offered. 15 min checks continued for safety.  R: Pt remains safe.

## 2014-12-18 NOTE — BHH Group Notes (Signed)
[place] LCSW Group Therapy  12/18/2014 1:15 PM  Type of Therapy:  Group Therapy  Participation Level:  Minimal  Participation Quality:  Drowsy or asleep  Affect: Unable to assess as pt was sleeping  Cognitive:  Oriented  Insight:  None shared  Engagement in Therapy:  None  Modes of Intervention:  Exploration, Rapport Building, Socialization and Support  Summary of Progress/Problems: Summary of Progress/Problems: The main focus of today's process group was for the patient to identify ways in which they have in the past sabotaged their own recovery. Motivational Interviewing was utilized to ask the group members what they get out of their self sabotage, and what reasons they may have for wanting to change. The patient expressed that she would like to get better sleep as she is often unable to sleep yet she slept throughout group.   Lyla Glassing 12/18/2014

## 2014-12-18 NOTE — Progress Notes (Signed)
[name] is acutely sad, depressed and very flat. When she speaks openly in group, she avoids making eye contact with this writer, saying " I just want to be able to keep my eyes open.. I have a hard time staying awake...my medicine makes me sooooooo sleepy".   A Kendra Simmons completed her morning assessment and on it she wrote she denied SI within the past 24 hrs, she rates her depression, hopelessness and anxiety "5/10/5" respectively.   R Safety in place.

## 2014-12-18 NOTE — Plan of Care (Signed)
Problem: Alteration in mood Goal: STG-Patient is able to discuss feelings and issues (Patient is able to discuss feelings and issues leading to depression)  Outcome: Progressing Pt alerts staff when increase anxiety and discusses issues that leads to depression.

## 2014-12-18 NOTE — Progress Notes (Signed)
Patient ID: Kendra Simmons, female   DOB: 09/24/1951, 64 y.o.   MRN: [id number] [facility] MD Progress Note  12/18/2014 1:41 PM AUBREY BLACKARD  MRN:  [id number] Subjective: Patient seen and chart reviewed. She reports that she slept much better last night after her medications were adjusted yesterday. Today, patient reports decreased worries, depression and anxiety. She rates her anxiety as 5/10 and depression 5/10 today. She also verbalized that she has been passing urine more easily since she started taking Flomax yesterday. Patient denies suicidal thoughts, delusional thinking or psychosis. She states that she has been scheduled for sleep studies by her PCP.  Diagnosis:   DSM5: Depressive Disorders:  Major Depressive Disorder - Moderate (296.22) Total Time spent with patient: 25 minutes  Axis I: Anxiety Disorder NOS  ADL's:  Intact  Sleep: better last night  Appetite:  Fair  Psychiatric Specialty Exam: Physical Exam  Review of Systems  HENT: Negative.   Eyes: Negative.   Respiratory: Negative.   Cardiovascular: Negative.   Gastrointestinal: Negative.   Genitourinary: Positive for dysuria.  Musculoskeletal: Negative.   Skin: Negative.   Neurological: Positive for weakness.  Endo/Heme/Allergies: Negative.   Psychiatric/Behavioral: Positive for depression. The patient is nervous/anxious and has insomnia.     Blood pressure 130/100, pulse 81, temperature 97.3 F (36.3 C), temperature source Oral, resp. rate 18, height 5\' 4"  (1.626 m), weight 72.576 kg (160 lb).Body mass index is 27.45 kg/(m^2).  General Appearance: Fairly Groomed  Engineer, water::  Fair  Speech:  Clear and Coherent, Slow and not spontaneous  Volume:  Decreased  Mood:  Depressed  Affect:  Restricted  Thought Process:  Coherent and Goal Directed  Orientation:  Full (Time, Place, and Person)  Thought Content:  symptoms worries concerns  Suicidal Thoughts:  intermittent  Homicidal Thoughts:  No  Memory:   Immediate;   Fair Recent;   Fair Remote;   Fair  Judgement:  Fair  Insight:  Shallow  Psychomotor Activity:  Restlessness  Concentration:  Fair  Recall:  [place]: Fair  Akathisia:  No  Handed:    AIMS (if indicated):     Assets:  Desire for Improvement Housing  Sleep:  Number of Hours: 6.75   Musculoskeletal: Strength & Muscle Tone: within normal limits Gait & Station: normal Patient leans: N/A  Current Medications: Current Facility-Administered Medications  Medication Dose Route Frequency Provider Last Rate Last Dose  . acetaminophen (TYLENOL) tablet 650 mg  650 mg Oral Q6H PRN Kennedy Bucker, NP   650 mg at 12/17/14 1435  . alum & mag hydroxide-simeth (MAALOX/MYLANTA) 200-200-20 MG/5ML suspension 30 mL  30 mL Oral Q4H PRN Kennedy Bucker, NP      . clonazePAM (KLONOPIN) tablet 0.5 mg  0.5 mg Oral BID Meron Bocchino   0.5 mg at 12/18/14 0855  . [START ON 12/21/2014] cyanocobalamin ((VITAMIN B-12)) injection 1,000 mcg  1,000 mcg Subcutaneous Weekly Kennedy Bucker, NP      . feeding supplement (ENSURE COMPLETE) (ENSURE COMPLETE) liquid 237 mL  237 mL Oral BID BM Clayton Bibles, RD   237 mL at 12/15/14 1115  . loperamide (IMODIUM) capsule 4 mg  4 mg Oral PRN Kennedy Bucker, NP      . magnesium hydroxide (MILK OF MAGNESIA) suspension 30 mL  30 mL Oral Daily PRN Kennedy Bucker, NP      . mirtazapine (REMERON SOL-TAB) disintegrating tablet 30 mg  30 mg Oral QHS Encarnacion Slates, NP   30  mg at 12/17/14 2204  . OLANZapine zydis (ZYPREXA) disintegrating tablet 15 mg  15 mg Oral QHS Naavya Postma   15 mg at 12/17/14 2203  . pantoprazole (PROTONIX) EC tablet 40 mg  40 mg Oral Daily Kennedy Bucker, NP   40 mg at 12/18/14 0855  . polyvinyl alcohol (LIQUIFILM TEARS) 1.4 % ophthalmic solution 1 drop  1 drop Both Eyes QID Encarnacion Slates, NP   1 drop at 12/18/14 0855  . pravastatin (PRAVACHOL) tablet 20 mg  20 mg Oral QHS Kennedy Bucker, NP   20 mg at 12/17/14 2203  .  tamsulosin (FLOMAX) capsule 0.4 mg  0.4 mg Oral QPC supper Veronica Fretz   0.4 mg at 12/17/14 1900  . traZODone (DESYREL) tablet 100 mg  100 mg Oral QHS Maryori Weide   100 mg at 12/17/14 2204  . [START ON 12/21/2014] Vitamin D (Ergocalciferol) (DRISDOL) capsule 50,000 Units  50,000 Units Oral Q7 days Kennedy Bucker, NP        Lab Results: No results found for this or any previous visit (from the past 48 hour(s)).  Physical Findings: AIMS: Facial and Oral Movements Muscles of Facial Expression: None, normal Lips and Perioral Area: None, normal Jaw: None, normal Tongue: None, normal,Extremity Movements Upper (arms, wrists, hands, fingers): None, normal Lower (legs, knees, ankles, toes): None, normal, Trunk Movements Neck, shoulders, hips: None, normal, Overall Severity Severity of abnormal movements (highest score from questions above): None, normal Incapacitation due to abnormal movements: None, normal Patient's awareness of abnormal movements (rate only patient's report): No Awareness, Dental Status Current problems with teeth and/or dentures?: No Does patient usually wear dentures?: No  CIWA:  CIWA-Ar Total: 1 COWS:  COWS Total Score: 1  Treatment Plan Summary: Daily contact with patient to assess and evaluate symptoms and progress in treatment Medication management  Plan: Supportive approach/coping skills           Depression; will continue the Remeron with Zyprexa augmentation            Anxiety: Will increase Clonazepam to 0.5mg  bid           Insomnia; will continue Zyprexa,  and Trazodone. Recommends sleep studies after discharge.           Urinary symptoms: will repeat the U/A and reassess for the presence of an UTI Medical Decision Making Problem Points:  Review of psycho-social stressors (1) Data Points:  Review or order clinical lab tests (1) Review of medication regiment & side effects (2)  I certify that inpatient services furnished can reasonably be expected to  improve the patient's condition.   Corena Pilgrim, MD 12/18/2014, 1:41 PM

## 2014-12-18 NOTE — Progress Notes (Signed)
Pt alert and cooperative. Affect/mood anxious, irritable, restless and  depressed. -SI/HI, verbally contracts for safety. -A/[place]. Minimum interaction with peers and attended group. Emotional support and encouragement given. Will continue to monitor closely and evaluate for stabilization.

## 2014-12-18 NOTE — Progress Notes (Signed)
D: Pt denies SI/HI/AVH. Pt is pleasant and cooperative. Pt continues to want people to feel sorry for her. Pt continues to say "I just want to be able to sleep". Per 15 min check sheets pt slept 8 hours the last 2 nights. Pt continues to have flat affect, depressed mood.   A: Pt was offered support and encouragement. Pt was given scheduled medications. Pt was encourage to attend groups. Q 15 minute checks were done for safety.   R:Pt attends groups and interacts well with peers and staff. Pt is taking medication. Pt has no complaints.Pt receptive to treatment and safety maintained on unit.

## 2014-12-18 NOTE — Progress Notes (Signed)
Adult Psychoeducational Group Note  Date:  12/18/2014 Time:  3:30PM  Group Topic/Focus:  Making Healthy Choices:   The focus of this group is to help patients identify negative/unhealthy choices they were using prior to admission and identify positive/healthier coping strategies to replace them upon discharge.  Participation Level:  Active  Participation Quality:  Appropriate  Affect:  Appropriate  Cognitive:  Appropriate  Insight: Appropriate  Engagement in Group:  Engaged  Modes of Intervention:  Discussion  Additional Comments:  Pt was active throughout group. Pt identified the "ABC's of Leisure" with peers and staff, naming a positive leisure activity for every letter of the alphabet   Vint Pola, Robley Fries 12/18/2014, 4:06 PM

## 2014-12-18 NOTE — Progress Notes (Signed)
The patient attended the A. A. Meeting this evening and was appropriate.  

## 2014-12-18 NOTE — Progress Notes (Signed)
Psychoeducational Group Note  Date:  07/26/2012 Time: 1015  Group Topic/Focus:  Identifying Needs:   The focus of this group is to help patients identify their personal needs that have been historically problematic and identify healthy behaviors to address their needs.  Participation Level:  Attentive  Participation Quality : Fair  Affect: flat  Cognitive:Intat    Insight:  INtact  Engagement in Group:   PD RN [facility]

## 2014-12-19 ENCOUNTER — Encounter (HOSPITAL_COMMUNITY): Payer: Self-pay | Admitting: Registered Nurse

## 2014-12-19 LAB — URINALYSIS, ROUTINE W REFLEX MICROSCOPIC
Bilirubin Urine: NEGATIVE
Glucose, UA: NEGATIVE mg/dL
Hgb urine dipstick: NEGATIVE
KETONES UR: NEGATIVE mg/dL
Leukocytes, UA: NEGATIVE
NITRITE: NEGATIVE
PROTEIN: NEGATIVE mg/dL
Specific Gravity, Urine: 1.022 (ref 1.005–1.030)
Urobilinogen, UA: 0.2 mg/dL (ref 0.0–1.0)
pH: 7 (ref 5.0–8.0)

## 2014-12-19 NOTE — Progress Notes (Signed)
Patient ID: Kendra Simmons, female   DOB: 1951/05/01, 64 y.o.   MRN: [id number]  [facility] MD Progress Note  12/19/2014 12:47 PM Kendra Simmons  MRN:  [id number]   Subjective: Patient seen and chart reviewed. Patient states that she did not get any sleep last night.  "I do not feel so good.  I didn't get any sleep last night.  My eyes are hurting and I can barley hold my head up I'm so tired I can't participate."   Patient continues to have complaints of depression, anxiety 5/10.  Patient denies suicidal/homicidal ideation, auditory  She reports that she slept much better last night after her medications were adjusted yesterday. Today, patient reports decreased worries, depression and anxiety. She rates her anxiety as 5/10 and depression 5/10 today. She also verbalized that she has been passing urine more easily since she started taking Flomax yesterday. Patient denies suicidal thoughts, delusional thinking or psychosis. She states that she has been scheduled for sleep studies by her PCP.  Diagnosis:   DSM5: Depressive Disorders:  Major Depressive Disorder - Moderate (296.22) Total Time spent with patient: 25 minutes  Axis I: Anxiety Disorder NOS  ADL's:  Intact  Sleep: Poor  Appetite:  Fair  Psychiatric Specialty Exam: Physical Exam  Psychiatric: Her speech is normal. Her mood appears anxious. She is slowed. Thought content is not paranoid and not delusional. Cognition and memory are normal. She exhibits a depressed mood. She expresses no homicidal and no suicidal ideation.    Review of Systems  Eyes: Negative.   Neurological: Positive for weakness.  Psychiatric/Behavioral: Positive for depression. The patient is nervous/anxious and has insomnia.   All other systems reviewed and are negative.   Blood pressure 85/52, pulse 96, temperature 97.8 F (36.6 C), temperature source Oral, resp. rate 16, height 5\' 4"  (1.626 m), weight 72.576 kg (160 lb).Body mass index is 27.45 kg/(m^2).   General Appearance: Fairly Groomed  Engineer, water::  Fair  Speech:  Clear and Coherent, Slow and not spontaneous  Volume:  Decreased  Mood:  Depressed  Affect:  Restricted  Thought Process:  Coherent and Goal Directed  Orientation:  Full (Time, Place, and Person)  Thought Content:  symptoms worries concerns  Suicidal Thoughts:  intermittent  Homicidal Thoughts:  No  Memory:  Immediate;   Fair Recent;   Fair Remote;   Fair  Judgement:  Fair  Insight:  Shallow  Psychomotor Activity:  Restlessness  Concentration:  Fair  Recall:  [place]: Fair  Akathisia:  No  Handed:    AIMS (if indicated):     Assets:  Desire for Improvement Housing  Sleep:  Number of Hours: 6.75   Musculoskeletal: Strength & Muscle Tone: within normal limits Gait & Station: normal Patient leans: N/A  Current Medications: Current Facility-Administered Medications  Medication Dose Route Frequency Provider Last Rate Last Dose  . acetaminophen (TYLENOL) tablet 650 mg  650 mg Oral Q6H PRN Kennedy Bucker, NP   650 mg at 12/17/14 1435  . alum & mag hydroxide-simeth (MAALOX/MYLANTA) 200-200-20 MG/5ML suspension 30 mL  30 mL Oral Q4H PRN Kennedy Bucker, NP      . clonazePAM (KLONOPIN) tablet 0.5 mg  0.5 mg Oral BID Chauntel Windsor   0.5 mg at 12/19/14 0805  . [START ON 12/21/2014] cyanocobalamin ((VITAMIN B-12)) injection 1,000 mcg  1,000 mcg Subcutaneous Weekly Kennedy Bucker, NP      . feeding supplement (ENSURE COMPLETE) (ENSURE COMPLETE) liquid 237 mL  237 mL Oral BID BM Clayton Bibles, RD   237 mL at 12/15/14 1115  . loperamide (IMODIUM) capsule 4 mg  4 mg Oral PRN Kennedy Bucker, NP      . magnesium hydroxide (MILK OF MAGNESIA) suspension 30 mL  30 mL Oral Daily PRN Kennedy Bucker, NP      . mirtazapine (REMERON SOL-TAB) disintegrating tablet 30 mg  30 mg Oral QHS Encarnacion Slates, NP   30 mg at 12/18/14 2241  . OLANZapine zydis (ZYPREXA) disintegrating tablet 15 mg  15 mg Oral QHS  Nolie Bignell   15 mg at 12/18/14 2241  . pantoprazole (PROTONIX) EC tablet 40 mg  40 mg Oral Daily Kennedy Bucker, NP   40 mg at 12/19/14 0804  . polyvinyl alcohol (LIQUIFILM TEARS) 1.4 % ophthalmic solution 1 drop  1 drop Both Eyes QID Encarnacion Slates, NP   1 drop at 12/19/14 0804  . pravastatin (PRAVACHOL) tablet 20 mg  20 mg Oral QHS Kennedy Bucker, NP   20 mg at 12/18/14 2240  . tamsulosin (FLOMAX) capsule 0.4 mg  0.4 mg Oral QPC supper Alise Calais   0.4 mg at 12/18/14 2241  . traZODone (DESYREL) tablet 100 mg  100 mg Oral QHS Shavawn Stobaugh   100 mg at 12/18/14 2241  . [START ON 12/21/2014] Vitamin D (Ergocalciferol) (DRISDOL) capsule 50,000 Units  50,000 Units Oral Q7 days Kennedy Bucker, NP        Lab Results: No results found for this or any previous visit (from the past 48 hour(s)).  Physical Findings: AIMS: Facial and Oral Movements Muscles of Facial Expression: None, normal Lips and Perioral Area: None, normal Jaw: None, normal Tongue: None, normal,Extremity Movements Upper (arms, wrists, hands, fingers): None, normal Lower (legs, knees, ankles, toes): None, normal, Trunk Movements Neck, shoulders, hips: None, normal, Overall Severity Severity of abnormal movements (highest score from questions above): None, normal Incapacitation due to abnormal movements: None, normal Patient's awareness of abnormal movements (rate only patient's report): No Awareness, Dental Status Current problems with teeth and/or dentures?: No Does patient usually wear dentures?: No  CIWA:  CIWA-Ar Total: 1 COWS:  COWS Total Score: 1  Treatment Plan Summary: Daily contact with patient to assess and evaluate symptoms and progress in treatment Medication management  Plan: Supportive approach/coping skills.   Continue Remeron with Zyprexa for depression; and Zyprexa and Trazodone for insomnia.   Clonazepam was increased to 0.5 Bid yesterday.   Continue to recommend sleep studies after patient is  stabilized and discharged home.         Continuing with current treatment plan.  No changes made.            Medical Decision Making Problem Points:  Review of last therapy session (1) and Review of psycho-social stressors (1) Data Points:  Review or order clinical lab tests (1) Review of medication regiment & side effects (2)  I certify that inpatient services furnished can reasonably be expected to improve the patient's condition.   Rankin, Shuvon, FNP-BC 12/19/2014, 12:47 PM  Patient seen, evaluated and I agree with notes by Nurse Practitioner. Corena Pilgrim, MD

## 2014-12-19 NOTE — Progress Notes (Signed)
Patient did attend the evening speaker AA meeting.  

## 2014-12-19 NOTE — Progress Notes (Signed)
D.  Pt anxious but pleasant on approach.  Denies complaints at this time.  Pt states that she doesn't need the eyedrops that are ordered tonight.  Positive for evening AA group, interacting appropriately with peers on the unit.  Denies SI/HIhallucinatons at this time.  A.  Support and encouragement offered  R.  Pt remains safe on unit, will continue to monitor.

## 2014-12-19 NOTE — [facility] Group Notes (Signed)
[place] LCSW Group Therapy  12/19/2014 1:15 PM  Type of Therapy:  Group Therapy  Participation Level:  Active  Participation Quality:  Appropriate  Affect:  Depressed  Cognitive:  Oriented  Insight:  Improving  Engagement in Therapy:  Developing/Improving  Modes of Intervention:  Discussion, Exploration, Orientation, Socialization and Support  Summary of Progress/Problems: The main focus of today's process group was to identify the patient's current support system and decide on other supports that can be put in place. An emphasis was placed on using counselor, doctor, therapy groups, 12-step groups, and problem-specific support groups to expand supports. There was also an extensive discussion about what constitutes a healthy support versus an unhealthy support. The patient expressed understanding of the concepts presented, and agreed that there is a need to add more supports. She reports family is not consistently supportive thus she has not contacted them regarding her hospitalization. She was able to process how her lack of energy keeps her from seeing friends she once enjoyed spending time with and that makes it harder to make contact for her. She reports willingness to reach out to friends and "maybe try going to [facility]."  Sheilah Pigeon, LCSW

## 2014-12-19 NOTE — Progress Notes (Signed)
Psychoeducational Group Note  Date:  12/19/2014 Time:  1015  Group Topic/Focus:  Making Healthy Choices:   The focus of this group is to help patients identify negative/unhealthy choices they were using prior to admission and identify positive/healthier coping strategies to replace them upon discharge.  Participation Level:  poor  Participation Quality:  poor Affect:  Appropriate  Cognitive:  Oriented  Insight: lacking  Engagement in Group:  Not Engaged  Additional Comments:  Pt slept throughout the whole group. Paulino Rily 12/19/2014

## 2014-12-19 NOTE — Plan of Care (Signed)
Problem: Diagnosis: Increased Risk For Suicide Attempt Goal: LTG-Patient Will Report Improved Mood and Deny Suicidal LTG (by discharge) Patient will report improved mood and deny suicidal ideation.  Outcome: Progressing Pt denies SI at this time Goal: LTG-Patient Will Report Absence of Withdrawal Symptoms LTG (by discharge): Patient will report absence of withdrawal symptoms.  Outcome: Not Met (add Reason) Pt has not endorsed any withdrawal Sx

## 2014-12-19 NOTE — Progress Notes (Signed)
D) Pt states "I didn't sleep last night. I was up all night. I don't know what I am going to do". A) Informed Pt that if she is awake when the techs come and check on her, she needs to let them know by wiggling her feet or signaling them in some way. Further explaination was to let Pt know it helps [place] to know and keep an accurate account of her sleep so we can tell the doctor and medication can be adjusted. R) Pt states "I don't think I will be able to sleep again. I know that you can't give me any medication that will work".

## 2014-12-20 ENCOUNTER — Other Ambulatory Visit (HOSPITAL_COMMUNITY): Payer: Self-pay | Admitting: Psychiatry

## 2014-12-20 MED ORDER — OMEPRAZOLE 20 MG PO CPDR: 20.0000 mg | DELAYED_RELEASE_CAPSULE | Freq: Every day | ORAL | Status: DC

## 2014-12-20 MED ORDER — OLANZAPINE 15 MG PO TBDP: 15.0000 mg | ORAL_TABLET | Freq: Every day | ORAL | Status: DC

## 2014-12-20 MED ORDER — TRAZODONE HCL 100 MG PO TABS: 100.0000 mg | ORAL_TABLET | Freq: Every day | ORAL | Status: DC

## 2014-12-20 MED ORDER — MIRTAZAPINE 30 MG PO TBDP: 30.0000 mg | ORAL_TABLET | Freq: Every day | ORAL | Status: DC

## 2014-12-20 MED ORDER — CLONAZEPAM 0.5 MG PO TABS: 0.5000 mg | ORAL_TABLET | Freq: Two times a day (BID) | ORAL | Status: DC

## 2014-12-20 MED ORDER — VITAMIN D (ERGOCALCIFEROL) 1.25 MG (50000 UNIT) PO CAPS: 50000.0000 [IU] | ORAL_CAPSULE | ORAL | Status: DC

## 2014-12-20 MED ORDER — POLYVINYL ALCOHOL 1.4 % OP SOLN: 1.0000 [drp] | OPHTHALMIC | Status: DC | PRN

## 2014-12-20 MED ORDER — CYANOCOBALAMIN 1000 MCG/ML IJ SOLN: 1000.0000 ug | INTRAMUSCULAR | Status: DC

## 2014-12-20 MED ORDER — PRAVASTATIN SODIUM 20 MG PO TABS: 20.0000 mg | ORAL_TABLET | Freq: Every day | ORAL | Status: DC

## 2014-12-20 MED ORDER — TAMSULOSIN HCL 0.4 MG PO CAPS: 0.4000 mg | ORAL_CAPSULE | Freq: Every day | ORAL | Status: DC

## 2014-12-20 MED ORDER — OLANZAPINE 5 MG PO TBDP
15.0000 mg | ORAL_TABLET | Freq: Every day | ORAL | Status: DC
  Filled 2014-12-20: qty 12

## 2014-12-20 NOTE — Discharge Summary (Signed)
Physician Discharge Summary Note  Patient:  Kendra Simmons is an 64 y.o., female MRN:  [id number] DOB:  03-10-1951 Patient phone:  [phone] (home)  Patient address:   [address] [place] [address],  Total Time spent with patient: Greater than 30 minutes  Date of Admission:  12/13/2014 Date of Discharge: 12/20/14  Reason for Admission:  Insomnia associated with Major depression  Discharge Diagnoses: Active Problems:   MDD (major depressive disorder)   Severe recurrent major depression without psychotic features   Insomnia   Psychiatric Specialty Exam: Physical Exam  Psychiatric: Her speech is normal and behavior is normal. Judgment and thought content normal. Her mood appears not anxious. Her affect is not angry, not blunt, not labile and not inappropriate. Cognition and memory are normal. She does not exhibit a depressed mood.    Review of Systems  Constitutional: Negative.   HENT: Negative.   Eyes: Negative.   Respiratory: Negative.   Cardiovascular: Negative.   Gastrointestinal: Negative.   Genitourinary: Negative.   Musculoskeletal: Negative.   Skin: Negative.   Neurological: Negative.   Endo/Heme/Allergies: Negative.   Psychiatric/Behavioral: Positive for depression (Stable). Negative for suicidal ideas, hallucinations, memory loss and substance abuse. The patient has insomnia (Stable). The patient is not nervous/anxious.     Blood pressure 110/74, pulse 113, temperature 98.4 F (36.9 C), temperature source Oral, resp. rate 18, height 5\' 4"  (1.626 m), weight 72.576 kg (160 lb).Body mass index is 27.45 kg/(m^2).  See Md's SRA     Past Psychiatric History: Depression " on and off" for years.  Diagnosis: Major depressive disorder.  Hospitalizations: [facility] adult unit x 2, One prior admission 20 years ago   Outpatient Care: Dr. Adele Schilder, [facility] outpatient clinic  Substance Abuse Care: Denies   Self-Mutilation: Denies any history of   Suicidal  Attempts: Denies any history of   Violent Behaviors: Denies    Musculoskeletal: Strength & Muscle Tone: within normal limits Gait & Station: normal Patient leans: N/A  DSM5: Schizophrenia Disorders:  NA Obsessive-Compulsive Disorders:  NA Trauma-Stressor Disorders:  NA Substance/Addictive Disorders:  NA Depressive Disorders:  MDD (major depressive disorder)  Axis Diagnosis:  AXIS I:  MDD (major depressive disorder) AXIS II:  Deferred AXIS III:   Past Medical History  Diagnosis Date  . High cholesterol   . Depression   . Anxiety   . GERD (gastroesophageal reflux disease)   . Insomnia   . Arthritis    AXIS IV:  other psychosocial or environmental problems and mental illness, chronic AXIS V:  63  Level of Care:  OP  Hospital Course:  Kendra Simmons is a 64 year old Caucasian female. Was discharged from this hospital last November after mood stabilization treatments and associated insomnia. She reports, "I came in to the hospital because I have not been sleeping. That was 2 days ago. My sleeping medicine (Zyprexa) does not work any longer. It stopped working few weeks after I was discharged from this hospital last [place]. Seroquel did me the same. I have taken a lot of medicines to help me sleep, but they usually will work in the beginning, then will stop working all together. I'm depressed about my situation. My depression has not gotten any better since I left this hospital. I'm about to give up. I feel hopeless.  While a patient in this hospital this time around, Kendra Simmons's medication regimen were adjusted to meet her mental/sleep needs. Zyprexa dosage was decreased from 20 mg to 15 mg Q bedtime for insomnia,  mood control/depression, Mirtazapine dosage from 45 to 30 mg Q bedtime for sleep/depression, Clonazepam 0.5 mg bid prn for anxiety/insomnia & Trazodone 100 mg Q bedtime for sleep. She was also resumed on all her pertinent home medications for her other pre-existing medical issues.  She tolerated her treatment regimen without any significant adverse effects and or reactions. Kendra Simmons enrolled and participated in the group counseling sessions being offered and held on this unit. She learned coping skills.  Upon her admission and throughout her hospital stay, Kendra Simmons maintained that she is not really depressed, and if she is depressed, it is related to her insomnia which makes feel hopeless. And as her treatment regimen continued, she was reporting improved mood as well as sleep with the current changes made on her psychotropic medicines. She is stable to be discharged to continue psychiatric care & medication management on an outpatient basis with Dr. Adele Schilder at the [facility]. Kendra Simmons is provided with all the necessary information required to make this appointment without problems.  Upon discharge, she adamantly denies any SIHI, AVH, delusions thoughts and or paranoia. Kendra Simmons received from the [facility], a 4 days worth, supply samples of her [facility] discharge medications. She left [facility] with all personal belongings in no apparent distress. Transportation per sister.   Consults:  psychiatry  Significant Diagnostic Studies:  labs: CBC with diff, CMP, UDS, toxicology tests, U/A, results reviewed, no changes  Discharge Vitals:   Blood pressure 110/74, pulse 113, temperature 98.4 F (36.9 C), temperature source Oral, resp. rate 18, height 5\' 4"  (1.626 m), weight 72.576 kg (160 lb). Body mass index is 27.45 kg/(m^2). Lab Results:   Results for orders placed or performed during the hospital encounter of 12/13/14 (from the past 72 hour(s))  Urinalysis, Routine w reflex microscopic     Status: None   Collection Time: 12/19/14  3:25 PM  Result Value Ref Range   Color, Urine YELLOW YELLOW   APPearance CLEAR CLEAR   Specific Gravity, Urine 1.022 1.005 - 1.030   pH 7.0 5.0 - 8.0   Glucose, UA NEGATIVE NEGATIVE mg/dL   Hgb urine dipstick NEGATIVE NEGATIVE   Bilirubin Urine  NEGATIVE NEGATIVE   Ketones, ur NEGATIVE NEGATIVE mg/dL   Protein, ur NEGATIVE NEGATIVE mg/dL   Urobilinogen, UA 0.2 0.0 - 1.0 mg/dL   Nitrite NEGATIVE NEGATIVE   Leukocytes, UA NEGATIVE NEGATIVE    Comment: MICROSCOPIC NOT DONE ON URINES WITH NEGATIVE PROTEIN, BLOOD, LEUKOCYTES, NITRITE, OR GLUCOSE <1000 mg/dL. Performed at [facility]    Physical Findings: AIMS: Facial and Oral Movements Muscles of Facial Expression: None, normal Lips and Perioral Area: None, normal Jaw: None, normal Tongue: None, normal,Extremity Movements Upper (arms, wrists, hands, fingers): None, normal Lower (legs, knees, ankles, toes): None, normal, Trunk Movements Neck, shoulders, hips: None, normal, Overall Severity Severity of abnormal movements (highest score from questions above): None, normal Incapacitation due to abnormal movements: None, normal Patient's awareness of abnormal movements (rate only patient's report): No Awareness, Dental Status Current problems with teeth and/or dentures?: No Does patient usually wear dentures?: No  CIWA:  CIWA-Ar Total: 1 COWS:  COWS Total Score: 1  Psychiatric Specialty Exam: See Psychiatric Specialty Exam and Suicide Risk Assessment completed by Attending Physician prior to discharge.  Discharge destination:  Home  Is patient on multiple antipsychotic therapies at discharge:  No   Has Patient had three or more failed trials of antipsychotic monotherapy by history:  No  Recommended Plan for Multiple Antipsychotic Therapies: NA  Medication List    STOP taking these medications        ADVIL PM PO     buPROPion 150 MG 24 hr tablet  Commonly known as:  WELLBUTRIN XL     OVER THE COUNTER MEDICATION     sertraline 100 MG tablet  Commonly known as:  ZOLOFT      TAKE these medications      Indication   clonazePAM 0.5 MG tablet  Commonly known as:  KLONOPIN  Take 1 tablet (0.5 mg total) by mouth 2 (two) times daily. For  anxiety/insomnia   Indication:  Anxiety/insomnia     cyanocobalamin 1000 MCG/ML injection  Commonly known as:  (VITAMIN B-12)  Inject 1 mL (1,000 mcg total) into the skin once a week. For bone health   Indication:  Inadequate Vitamin B12     mirtazapine 30 MG disintegrating tablet  Commonly known as:  REMERON SOL-TAB  Take 1 tablet (30 mg total) by mouth at bedtime. For depression/insomnia   Indication:  Trouble Sleeping, Major Depressive Disorder     olanzapine zydis 15 MG disintegrating tablet  Commonly known as:  ZYPREXA  Take 1 tablet (15 mg total) by mouth at bedtime. For Insomnia, depression/mood control   Indication:  Trouble Sleeping, Major Depressive Disorder, Mood control     omeprazole 20 MG capsule  Commonly known as:  PRILOSEC  Take 1 capsule (20 mg total) by mouth daily. For acid reflux   Indication:  Gastroesophageal Reflux Disease     polyvinyl alcohol 1.4 % ophthalmic solution  Commonly known as:  LIQUIFILM TEARS  Place 1 drop into both eyes as needed for dry eyes.   Indication:  Irritation of the Eye     pravastatin 20 MG tablet  Commonly known as:  PRAVACHOL  Take 1 tablet (20 mg total) by mouth at bedtime. For high cholesterol   Indication:  Inherited Heterozygous Hypercholesterolemia     tamsulosin 0.4 MG Caps capsule  Commonly known as:  FLOMAX  Take 1 capsule (0.4 mg total) by mouth daily after supper. For frequent urination/urgency   Indication:  Urinary urgency     traZODone 100 MG tablet  Commonly known as:  DESYREL  Take 1 tablet (100 mg total) by mouth at bedtime. For sleep   Indication:  Trouble Sleeping     Vitamin D (Ergocalciferol) 50000 UNITS Caps capsule  Commonly known as:  DRISDOL  Take 1 capsule (50,000 Units total) by mouth every 7 (seven) days. On Wednesday: For bone health   Indication:  Bone health       Follow-up Information    Follow up with Dr. Adele Schilder On 12/28/2014.   Why:  Tuesday, December 28, 2014 at 9:30 AM   Contact  information:   [address] [place], [place]  [address]  [phone]     Follow-up recommendations: Activity:  As tolerated Diet: As recommended by your primary care doctor. Keep all scheduled follow-up appointments as recommended.   Comments: Take all your medications as prescribed by your mental healthcare provider. Report any adverse effects and or reactions from your medicines to your outpatient provider promptly. Patient is instructed and cautioned to not engage in alcohol and or illegal drug use while on prescription medicines. In the event of worsening symptoms, patient is instructed to call the crisis hotline, 911 and or go to the nearest ED for appropriate evaluation and treatment of symptoms. Follow-up with your primary care provider for your other medical issues, concerns and  or health care needs.   Total Discharge Time:  Greater than 30 minutes.  Signed: Encarnacion Slates, PMHNP-BC 12/20/2014, 10:11 AM  I personally assessed the patient and formulated the plan Geralyn Flash A. Sabra Heck, M.D.

## 2014-12-20 NOTE — B[facility] Suicide Risk Assessment (Signed)
Suicide Risk Assessment  Discharge Assessment     Demographic Factors:  Caucasian and Living alone  Total Time spent with patient: 30 minutes  Psychiatric Specialty Exam:     Blood pressure 110/74, pulse 113, temperature 98.4 F (36.9 C), temperature source Oral, resp. rate 18, height 5\' 4"  (1.626 m), weight 72.576 kg (160 lb).Body mass index is 27.45 kg/(m^2).  General Appearance: Fairly Groomed  Engineer, water::  Fair  Speech:  Clear and Coherent  Volume:  Decreased  Mood:  worried  Affect:  worried  Thought Process:  Coherent and Goal Directed  Orientation:  Full (Time, Place, and Person)  Thought Content:  plans as she moves on  Suicidal Thoughts:  No  Homicidal Thoughts:  No  Memory:  Immediate;   Fair Recent;   Fair Remote;   Fair  Judgement:  Fair  Insight:  Present  Psychomotor Activity:  Normal  Concentration:  Fair  Recall:  [employer] of Person: Fair  Akathisia:  No  Handed:    AIMS (if indicated):     Assets:  Desire for Improvement Housing Social Support  Sleep:  Number of Hours: 5.5    Musculoskeletal: Strength & Muscle Tone: within normal limits Gait & Station: normal Patient leans: N/A   Mental Status Per Nursing Assessment::   On Admission:  Suicidal ideation indicated by patient, Self-harm thoughts  Current Mental Status by Physician: In full contact with reality. There are no active SI plans or intent. She is sleeping better and have not experienced the pain in her eyes. She feels ready to be D/C back home. She will work with Dr. Adele Schilder on an outpatient basis to adjust medications further if necessary   Loss Factors: NA  Historical Factors: NA  Risk Reduction Factors:   Positive social support and Positive therapeutic relationship  Continued Clinical Symptoms:  Depression:   Insomnia  Cognitive Features That Contribute To Risk:  Closed-mindedness Polarized thinking Thought constriction (tunnel vision)     Suicide Risk:  Minimal: No identifiable suicidal ideation.  Patients presenting with no risk factors but with morbid ruminations; may be classified as minimal risk based on the severity of the depressive symptoms  Discharge Diagnoses:   AXIS I:  Major Depression recurrent severe without psychotic features, Insomnia AXIS II:  No diagnosis AXIS III:   Past Medical History  Diagnosis Date  . High cholesterol   . Depression   . Anxiety   . GERD (gastroesophageal reflux disease)   . Insomnia   . Arthritis    AXIS IV:  other psychosocial or environmental problems AXIS V:  61-70 mild symptoms  Plan Of Care/Follow-up recommendations:  Activity:  as tolerated Diet:  regular Follow up [facility], Dr. Adele Schilder Is patient on multiple antipsychotic therapies at discharge:  No   Has Patient had three or more failed trials of antipsychotic monotherapy by history:  No  Recommended Plan for Multiple Antipsychotic Therapies: NA    Shenell Rogalski A 12/20/2014, 12:53 PM

## 2014-12-20 NOTE — Progress Notes (Signed)
DISCHARGE NOTE: D: Patient was alert, oriented, in stable condition, and ambulatory with a steady gait upon discharge. Pt denies SI/HI and AVH. A: Pt sister present during all discharge nursing interventions per pt request. AVS reviewed and given to pt. Follow up reviewed with pt. Resources reviewed with pt, including [facility]. Prescriptions/Medications given to pt. Belongings returned to pt. Pt given time to ask questions and express concerns. R: Pt D/C'd to sister.

## 2014-12-20 NOTE — Progress Notes (Signed)
D: Patient is alert and oriented. Pt's mood and affect is depressed and blunted. Pt's eye contact is brief. Pt reports her depression 5/10 and hopelessness 10/10. Pt denies SI/HI and AVH at this time. Pt reports that she has not been sleeping well but did get some sleep last night, but still does not feel rested. Pt is attending groups. Pt complains of left shoulder pain 7/10. Pt did not attend group this morning, but has attended afternoon groups. A: Encouragement/Support provided to pt. PRN medications administered for pain per providers orders (See MAR). Scheduled medications administered per providers orders (See MAR). 15 minute checks continued per protocol for patient safety.  R: Patient cooperative and receptive to nursing interventions.

## 2014-12-20 NOTE — Progress Notes (Signed)
[facility] Adult Case Management Discharge Plan :  Will you be returning to the same living situation after discharge:  Yes, patient is returning to her home. At discharge, do you have transportation home?:Yes,  Sister will transport patient home. Do you have the ability to pay for your medications:Yes,  Patient is able to obtain medications.  Release of information consent forms completed and in the chart;  Patient's signature needed at discharge.  Patient to Follow up at: Follow-up Information    Follow up with Dr. Adele Schilder On 12/28/2014.   Why:  Tuesday, December 28, 2014 at 9:30 AM   Contact information:   [address] [place],   [address]  [phone]      Patient denies SI/HI:   Patient no longer endorsing SI/HI or other thoughts of self harm.     Safety Planning and Suicide Prevention discussed:  .Reviewed with all patients during discharge planning group   N/A patient is not a smoker  Kendra Simmons 12/20/2014, 10:04 AM

## 2014-12-20 NOTE — Progress Notes (Signed)
Psychoeducational Group Note  Date:  12/20/2014 Time:  1015  Group Topic/Focus:  Self Care:   The focus of this group is to help patients understand the importance of self-care in order to improve or restore emotional, physical, spiritual, interpersonal, and financial health.  Participation Level: Did Not Attend  Participation Quality:  Not Applicable  Affect:  Not Applicable  Cognitive:  Not Applicable  Insight:  Not Applicable  Engagement in Group: Not Applicable  Additional Comments:  Pt. refused to attend group   Kendra Simmons Patience 12/20/2014, 1:41 PM

## 2014-12-20 NOTE — Clinical Social Work Note (Signed)
CSW spoke with patient's sister, [name], to advise of patient being discharged today.  Sister to transport patient around 14:30 today.  Sister asked that patient be encouraged to take medications as prescribed.  Patient's RN advised and will follow up with patient.

## 2014-12-20 NOTE — [facility] Group Notes (Signed)
[facility] LCSW Aftercare Discharge Planning Group Note   12/20/2014 10:01 AM    Participation Quality:  Appropraite  Mood/Affect:  Appropriate  Depression Rating:  5  Anxiety Rating:  5  Thoughts of Suicide:  No  Will you contract for safety?   NA  Current AVH:  No  Plan for Discharge/Comments:  Patient attended discharge planning group and actively participated in group. She reports sleeping last night and being ready to discharge home today.  She will follow up with [facility].  Suicide prevention education reviewed and SPE document provided.   Transportation Means: Patient has transportation.   Supports:  Patient has a support system.   Bentlee Benningfield, Eulas Post

## 2014-12-21 NOTE — Progress Notes (Signed)
Patient Discharge Instructions:  Next Level Care Provider Has Access to the EMR, 12/21/14  Records provided to [facility] via [facility]/Epic access.  Kendra Simmons, 12/21/2014, 3:34 PM

## 2014-12-24 ENCOUNTER — Encounter: Payer: Self-pay | Admitting: Neurology

## 2014-12-24 ENCOUNTER — Ambulatory Visit (INDEPENDENT_AMBULATORY_CARE_PROVIDER_SITE_OTHER): Payer: Medicare Other | Admitting: Neurology

## 2014-12-24 VITALS — BP 124/85 | HR 110 | Resp 14 | Ht 63.5 in | Wt 160.5 lb

## 2014-12-24 DIAGNOSIS — M2619 Other specified anomalies of jaw-cranial base relationship: Secondary | ICD-10-CM | POA: Insufficient documentation

## 2014-12-24 DIAGNOSIS — F5105 Insomnia due to other mental disorder: Secondary | ICD-10-CM | POA: Insufficient documentation

## 2014-12-24 DIAGNOSIS — R0683 Snoring: Secondary | ICD-10-CM | POA: Diagnosis not present

## 2014-12-24 DIAGNOSIS — F489 Nonpsychotic mental disorder, unspecified: Secondary | ICD-10-CM | POA: Diagnosis not present

## 2014-12-24 DIAGNOSIS — R634 Abnormal weight loss: Secondary | ICD-10-CM | POA: Diagnosis not present

## 2014-12-24 DIAGNOSIS — G219 Secondary parkinsonism, unspecified: Secondary | ICD-10-CM | POA: Diagnosis not present

## 2014-12-24 NOTE — Progress Notes (Signed)
[facility]  SLEEP MEDICINE CLINIC   PATIENT: Kendra Simmons DOB: 1951/01/15  REFERRING CLINICIAN:  HISTORY FROM: patient  REASON FOR VISIT: new consult - sleep   HISTORICAL  CHIEF COMPLAINT:  Chief Complaint  Patient presents with  . NP Sleep Consult  Arfeen    Rm 43, sister    HISTORY OF PRESENT ILLNESS:   NEW HPI 08/27/14: 64 year old female here for evaluation of sleep difficulties.   Previous evaluated for memory and mood issues by Dr Adele Schilder.  Her PCP is Dr. Nancy Fetter and her neurologist is Dr. Leta Baptist.  The patient was just hospitalized the first week of the new year  2016 for depression in behavior health .  Kendra Simmons is for the first time seen in the sleep clinic here at [facility] she reports suffering from major depression with episodic more severe spells and was just recently hospitalized. She appears very tired today here in the clinic. Her geriatric depression score was endorsed at 15 out of 15 points her Epworth sleepiness score at 4 points and her fatigue severity score at 49 points. She is referred by her PCP and her psychiatrist for a sleep study pre-evaluation  In her review of systems she endorsed dry eyes insomnia or depression anxiety the feeling of not getting enough sleep the loss of distal of interest in activities that keep she used to be light him and sometimes mental confusion. She carries a diagnosis of depression and anxiety. She states that she is aware that she has sleep apnea and had been tested before but due to claustrophobia could not tolerate the interface that was prescribed to treat sleep apnea. Her family history and social history are listed below. 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 The patient's mother still alive for father died with lung disease at age 35 her maternal grandmother died of  concerned.   The patient's medication was reviewed she is on several medications that would increase her daytime sleepiness namely Desyrel, Zyprexa, Remeron, Klonopin. In addition, she takes vitamin B Prilosec Pravachol Flomax and vitamin D.  She reports that she just doesn't get much sleep. She takes most of her medications between 9 and 10 PM and usually goes to bed at around 10 PM. It may take a still a while to fall asleep if she does at all. She estimates her usual sleep latency to be several hours. Her to sleep time overnight is estimated to be between 3 and 5 hours. She rises to take her Klonopin at 9 AM but her rise time can also very she usually watches TV during the day and she feels that sounds that trigger sleepiness and other people have a reciprocal a paradoxical effect on her. For example rain and went to not make her fall asleep. Her bedroom is quiet and very dark if she stays at her sister's house she asked her sister to even stop her clocks as a ticking may interrupt her sleep.  She seems to be very hypervigilant at night. She worries  All the time, she self reported, and this all her life.  Her last sleep study was over 10 years ago. I explained to the patient that insomnia with hypervigilance may not respond to the treatment of apnea even if found and proportionally treated. However to rule out organic reasons that affect her sleep on top  of any kind of behavior health issues is her in interest at this time.  No history of surgery or trauma to neck , face or airway.   No history of smoking, ETOH - no substances.     Dr Gladstone Lighter note :   Past 1-2 months patient has had gradual onset, progressive numbness and tingling in the toes and feet. Symptoms are symmetric. No pain. No burning or pins and needles. She feels a mild annoying numbness sensation. No low back pain. No proximal symptoms. No symptoms in her hands.  UPDATE 01/04/14: Doing well. No new issues. Memory and mood issues  are stable. She is concerned about cost of namenda (> $300 per month) and she cannot afford it. Also, she tells me that her dementia diagnosis is not clear, and that she was told that her memory issues are related to mood and pain issues.   PRIOR HPI (03/20/12, Dr. Erling Cruz): 64 year old right-handed white single female with a 4-1/2 year history of falls causing left elbow fracture 12/13/06. She is being followed by Dr. Caryl Comes and has been evaluated with a tilt table. She fell 05/23/10. She does not have syncope with falls. There is no warning. At times she feels sluggish before falling. She denies bowel or bladder incontinence. Workup in the hospital for falls has included CT scans of the brain 12/13/06 and 09/01/07 showing bifrontal atrophy. MRI study of the brain 01/12/10 shows increased hyperintensity on T2 study of the  bilateral petrous apices representing pneumatized bone. The MRI also shows focal atrophy. She has vitamin B12 deficiency with methylmalonic acid of 207 homocystine 14.7 on 01/18/10. Other studies have shown vitamin D deficiency. RPR nonreactive, B12 254, cortisol 12.2, TSH 1.93, Cholesterol 267,HDL 51, LDL 90, CBC normal, and CMP normal. She denies a family history of dementia. She has a history of depression previously followed by [name] but is now going to the  [facility] .She is sleeping much better, feels much better, and has increased appetite. She is not having hallucinations. She is not having headaches, visual loss, double vision, or swallowing problems. She is able to live by herself and  drives a car which her sister says has been safe. The patient states "my head is clear" though I still have problems sleeping at night.08/29/2012= MMSE 25/30. Clock drawing task 4/4. Animal  fluency task 13. Ron Parker Index of independence in activities of daily living 6.  Instrumental  activities of daily living scale 7.    Neuropsychiatric inventory one for hallucinations, 5 for agitation, 6 for  depression, 6 for anxiety, 5 for disinhibition,  5 for irritability, 8 for night time behaviors, and 6 for appetite change. Geriatric depression scale 7/15.  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 CBC, CMP, TSH  were normal except for slightly elevated liver function tests and  TSH 8.27 on 07/17/2011. She is doing well on Namenda. Her only complaint is lower jaw pain from a dry socket at tooth #18. She is concerned about weight gain on Abilify.  REVIEW OF SYSTEMS: Full 14 system review of systems performed : she endorsed dry eyes insomnia or depression anxiety the feeling of not getting enough sleep,  the loss of  interest in activities that keep she used to delight in and sometimes mental confusion.  She carries a diagnosis of depression and anxiety. She states that she is aware that she has sleep apnea and had been tested before but  due to claustrophobia could not tolerate the interface that was prescribed to treat sleep apnea.depression anxiety, not enough sleep ,racing thoughts.  epworth 4, FSS 49, patient endorsed no daytime naps, but her sister states she does.  GDS 15 points.    ALLERGIES: No Known Allergies  HOME MEDICATIONS: Outpatient Prescriptions Prior to Visit  Medication Sig Dispense Refill  . clonazePAM (KLONOPIN) 0.5 MG tablet Take 1 tablet (0.5 mg total) by mouth 2 (two) times daily. For anxiety/insomnia 10 tablet 0  . cyanocobalamin (,VITAMIN B-12,) 1000 MCG/ML injection Inject 1 mL (1,000 mcg total) into the skin once a week. For bone health 1 mL 0  . mirtazapine (REMERON SOL-TAB) 30 MG disintegrating tablet Take 1 tablet (30 mg total) by mouth at bedtime. For depression/insomnia 30 tablet 0  . OLANZapine zydis (ZYPREXA) 15 MG disintegrating tablet Take 1 tablet (15 mg total) by mouth at bedtime. For Insomnia, depression/mood control 30 tablet 0  . omeprazole (PRILOSEC) 20 MG capsule Take 1 capsule (20 mg total) by  mouth daily. For acid reflux    . polyvinyl alcohol (LIQUIFILM TEARS) 1.4 % ophthalmic solution Place 1 drop into both eyes as needed for dry eyes. 15 mL 0  . pravastatin (PRAVACHOL) 20 MG tablet Take 1 tablet (20 mg total) by mouth at bedtime. For high cholesterol    . tamsulosin (FLOMAX) 0.4 MG CAPS capsule Take 1 capsule (0.4 mg total) by mouth daily after supper. For frequent urination/urgency 15 capsule 0  . traZODone (DESYREL) 100 MG tablet Take 1 tablet (100 mg total) by mouth at bedtime. For sleep 30 tablet 0  . Vitamin D, Ergocalciferol, (DRISDOL) 50000 UNITS CAPS capsule Take 1 capsule (50,000 Units total) by mouth every 7 (seven) days. On Wednesday: For bone health 30 capsule    No facility-administered medications prior to visit.    PAST MEDICAL HISTORY: Past Medical History  Diagnosis Date  . High cholesterol   . Depression   . Anxiety   . GERD (gastroesophageal reflux disease)   . Insomnia   . Arthritis   . Memory loss     PAST SURGICAL HISTORY: Past Surgical History  Procedure Laterality Date  . Cosmetic surgery    . Cesarean section    . Laproscopy      FAMILY HISTORY: Family History  Problem Relation Age of Onset  . Sleep apnea Father   . Alcohol abuse Father   . Diabetes Mother   . Diabetes Sister   . Diabetes Maternal Uncle   . Diabetes Cousin     SOCIAL HISTORY:  History   Social History  . Marital Status: Divorced    Spouse Name: N/A    Number of Children: 3  . Years of Education: 14   Occupational History  . Retired    Social History Main Topics  . Smoking status: Never Smoker   . Smokeless tobacco: Never Used  . Alcohol Use: No  . Drug Use: No     Comment: Denies any drug use other than benzos  . Sexual Activity: No   Other Topics Concern  . Not on file   Social History Narrative   Patient lives at home alone.   Caffeine Use:  2 cokes daily   Sister lives next door.     PHYSICAL EXAM  Filed Vitals:   12/24/14 0926  BP:  124/85  Pulse: 110  Resp: 14  Height: 5' 3.5" (1.613 m)  Weight: 160 lb 8 oz (72.802 kg)  Not recorded      Body mass index is 27.98 kg/(m^2).  General: The patient is awake, alert and appears not in acute distress. The patient is poorly groomed. She lost a lot of weight, lost appetite. 40 pounds in 6 month.   Head: Normocephalic, atraumatic. Neck is supple. Mallampati 4 - uvula not visible.  neck circumference: 13 , retrognathia , severe, clicking at TMJ right more than left. Titubation.  Cardiovascular:  Regular rate and rhythm , without  murmurs or carotid bruit, and without distended neck veins. Respiratory: Lungs are clear to auscultation. Skin:  Without evidence of edema, or rash Trunk: hunched  posture.   Neurologic exam : The patient is awake and alert, oriented to place and time.   There is a normal attention span & concentration ability. Speech is fluent without  Dysarthria, but she has mild  Dysphonia, speaks slow . Mood and affect are depressed.  Cranial nerves: Pupils are equal and briskly reactive to light.  Funduscopic exam without   evidence of pallor or edema. Extraocular movements  in  horizontal planes with coarse saccades , vertical gaze restricted, upwards.  and with nystagmus. Visual fields by finger perimetry are intact. Hearing to finger rub intact.  Facial sensation intact to fine touch. Facial motor strength is symmetric and tongue and uvula move midline.  Motor exam:  elevated  Tone,  and reduced  muscle bulk and symmetric  strength in all extremities. Mild cog wheeling.    Sensory:  Fine touch, pinprick and vibration were tested in upper  Extremities. She reported  [place] of vibration , touch . In both feet.   Proprioception was affected in lower extremities, intact in upper.   Coordination: Rapid alternating movements in the fingers/hands is tested and normal. Finger-to-nose maneuver tested and normal without evidence of ataxia, dysmetria or  tremor.  Gait and station: Patient walks without assistive device and is able and assisted stool climb up to the exam table. She walks very slow 9 depression ? Shuffling  Gait  ?  Deep tendon reflexes: in the upper and lower extremities are symmetric and intact. Babinski maneuver response is downgoing.   Assessment:  After physical and neurologic examination, review of laboratory studies, imaging, neurophysiology testing and pre-existing records, assessment will be reviewed on the problem list.   Visit duration 45 minutes with  Over 505 of conversation face to face with patient and her sister , information about diagnostic procedures and treatment options of apnea versus UARS. The patient's Parkinsonsim may have to be addressed in the future ( by Dr. Leta Baptist ). The patient had just been hospitalized for major depession ,her medications were adjusted and her fatigue level peaked. [name]   1)  High fatigue and insomnia : patient with a lifelong history of major depression and sleep affected by this mental disorder.  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 UARS .OSA was diagnosed over 10 years ago, before she lost over 60 pounds.  A new evaluation is necessary.  4) A sleep test  would be the only option for this patient with variable sleep times and anxiety to sleep, she reports feeling more secure in a supervised environment. No morning headches.    Plan:  Treatment plan and additional workup will be reviewed under Problem List. SPLIT at  4% 15 AHI ,  If not found to have oxygen desaturations, consider dental device for mandibular advancement.      DIAGNOSTIC DATA (LABS, IMAGING, TESTING) - I reviewed patient records, labs, notes, testing and imaging myself where available.  Lab Results  Component Value Date   WBC 5.6  12/13/2014   HGB 14.0 12/13/2014   HCT 42.0 12/13/2014   MCV 89.2 12/13/2014   PLT 142* 12/13/2014      Component Value Date/Time   NA 140 12/13/2014 1752   K 3.6 12/13/2014 1752   CL 110 12/13/2014 1752   CO2 22 12/13/2014 1752   GLUCOSE 150* 12/13/2014 1752   BUN 16 12/13/2014 1752   CREATININE 0.57 12/13/2014 1752   CALCIUM 9.1 12/13/2014 1752   PROT 7.2 12/13/2014 1752   ALBUMIN 4.5 12/13/2014 1752   AST 24 12/13/2014 1752   ALT 12 12/13/2014 1752   ALKPHOS 67 12/13/2014 1752   BILITOT 0.6 12/13/2014 1752   GFRNONAA >90 12/13/2014 1752   GFRAA >90 12/13/2014 1752   Lab Results  Component Value Date   CHOL 177 11/04/2014   Lab Results  Component Value Date   HGBA1C 5.8* 11/04/2014   No results found for: VITAMINB12 Lab Results  Component Value Date   TSH 2.870 10/16/2014    I reviewed labs,  images and reports myself and agree with interpretation. -CD  08/15/11 MRI brain - Non-specific enlargement of the subarachnoid spaces over the bifrontal region, without definite underlying cortical atrophy. 02/06/14 CT head / cervical spine - Generalized atrophy. No acute intracranial abnormalities. Mild scattered degenerative disc and facet disease changes of the cervical spine. No acute cervical spine abnormalities.       Arley Salamone C. Verdell Dykman, MD ABSM ,ABPN  03/19/3535, 14:43 AM Certified in Neurology, Neurophysiology and Sleep medicine   [facility] [address] [place], [place] [address] [phone]

## 2014-12-27 ENCOUNTER — Telehealth (HOSPITAL_COMMUNITY): Payer: Self-pay

## 2014-12-28 ENCOUNTER — Ambulatory Visit (INDEPENDENT_AMBULATORY_CARE_PROVIDER_SITE_OTHER): Payer: Medicare Other | Admitting: Psychiatry

## 2014-12-28 ENCOUNTER — Encounter (HOSPITAL_COMMUNITY): Payer: Self-pay | Admitting: Psychiatry

## 2014-12-28 VITALS — BP 149/89 | HR 128 | Ht 63.5 in | Wt 159.4 lb

## 2014-12-28 DIAGNOSIS — F331 Major depressive disorder, recurrent, moderate: Secondary | ICD-10-CM

## 2014-12-28 DIAGNOSIS — G3184 Mild cognitive impairment, so stated: Secondary | ICD-10-CM

## 2014-12-28 DIAGNOSIS — F4310 Post-traumatic stress disorder, unspecified: Secondary | ICD-10-CM

## 2014-12-28 MED ORDER — TEMAZEPAM 15 MG PO CAPS: 15.0000 mg | ORAL_CAPSULE | Freq: Every evening | ORAL | Status: DC | PRN

## 2014-12-28 MED ORDER — MIRTAZAPINE 15 MG PO TBDP: 15.0000 mg | ORAL_TABLET | Freq: Every day | ORAL | Status: DC

## 2014-12-28 MED ORDER — OLANZAPINE 15 MG PO TBDP: 15.0000 mg | ORAL_TABLET | Freq: Every day | ORAL | Status: DC

## 2014-12-28 MED ORDER — TEMAZEPAM 7.5 MG PO CAPS: 7.5000 mg | ORAL_CAPSULE | Freq: Every evening | ORAL | Status: DC | PRN

## 2014-12-28 MED ORDER — TRAZODONE HCL 100 MG PO TABS: 100.0000 mg | ORAL_TABLET | Freq: Every day | ORAL | Status: DC

## 2014-12-28 NOTE — Progress Notes (Signed)
[place] Progress Note   Kendra Simmons [id number] 64 y.o.  12/28/2014 1:43 PM  Chief Complaint:  I was again admitted to behavioral [place] because I could not sleep.     History of Present Illness:  Kendra Simmons came for her follow-up appointment with her sister.  She was again admitted to behavioral [facility] and recently discharged .  She was admitted because of insomnia and anxiety symptoms.  She was sleeping only 1-2 hours.  She mentioned lack of sleep making her more anxious nervous and depressed.  During her hospitalization her Zyprexa was reduced , Remeron was decreased and benzodiazepine was increased.  She was also given trazodone 100 mg at bedtime.  She do not see any improvement and continued to endorse lack of sleep and feeling tired.  She continued to demand more medication for insomnia.  She like Zyprexa because it is helping her depression but her main problem is insomnia.  This is her second admission in 30 days.  Patient did not have sleep study however schedule to have one on 27th.  She lives by herself however she has good support from her sister who usually visit every day and manage her medication.  Patient denies any paranoia, hallucination, crying spells but endorsed feeling tired and exhausted secondary to insomnia.  Her appetite is okay.  Her vitals are stable.  Suicidal Ideation: No Plan Formed: No Patient has means to carry out plan: No  Homicidal Ideation: No Plan Formed: No Patient has means to carry out plan: No   Past Psychiatric History/Hospitalization(s) Patient has at least 4 psychiatric hospitalizations.  Her last hospitalization was January 2016 .  Earlier she was admitted in November 2015 at behavioral [facility] because of suicidal thoughts.  She has been admitted to [facility] and multiple hospitalization at [facility].  She has done once intensive outpatient program.  She has a history of hallucination,  paranoia, severe depression. In the past she has seen [name], [name] and [name].  As per chart she has taken in the past doxepin, Xanax, Seroquel, amitriptyline, imipramine, Wellbutrin, Prozac and recently Seroquel .   Anxiety: Yes Bipolar Disorder: No Depression: Yes Mania: No Psychosis: History of hallucinations Schizophrenia: No Personality Disorder: No Hospitalization for psychiatric illness: Yes History of Electroconvulsive Shock Therapy: No Prior Suicide Attempts: No  Medical History; Patient has high cholesterol, memory impairment, GERD and palpitation.  She has history of frequent falls and seen by a neurologist in the past.  Her primary care physician is [name] in [place].  Review of Systems  Constitutional:       Tired  Skin: Negative.  Negative for itching and rash.  Neurological:       Neuropathy in her feet  Psychiatric/Behavioral: The patient is nervous/anxious and has insomnia.        Memory impairment   Psychiatric: Agitation: No Hallucination: No Depressed Mood: No Insomnia: Yes Hypersomnia: No Altered Concentration: No Feels Worthless: No Grandiose Ideas: No Belief In Special Powers: No New/Increased Substance Abuse: No Compulsions: No  Neurologic: Headache: No Seizure: No Paresthesias: Yes   Musculoskeletal: Strength & Muscle Tone: within normal limits Gait & Station: normal Patient leans: N/A   Outpatient Encounter Prescriptions as of 12/28/2014  Medication Sig  . cyanocobalamin (,VITAMIN B-12,) 1000 MCG/ML injection Inject 1 mL (1,000 mcg total) into the skin once a week. For bone health  . mirtazapine (REMERON SOL-TAB) 15 MG disintegrating tablet Take 1  tablet (15 mg total) by mouth at bedtime. For depression/insomnia  . olanzapine zydis (ZYPREXA) 15 MG disintegrating tablet Take 1 tablet (15 mg total) by mouth at bedtime. For Insomnia, depression/mood control  . omeprazole (PRILOSEC) 20 MG capsule Take 1 capsule  (20 mg total) by mouth daily. For acid reflux  . polyvinyl alcohol (LIQUIFILM TEARS) 1.4 % ophthalmic solution Place 1 drop into both eyes as needed for dry eyes.  . pravastatin (PRAVACHOL) 20 MG tablet Take 1 tablet (20 mg total) by mouth at bedtime. For high cholesterol  . tamsulosin (FLOMAX) 0.4 MG CAPS capsule Take 1 capsule (0.4 mg total) by mouth daily after supper. For frequent urination/urgency  . temazepam (RESTORIL) 15 MG capsule Take 1 capsule (15 mg total) by mouth at bedtime as needed for sleep.  . traZODone (DESYREL) 100 MG tablet Take 1 tablet (100 mg total) by mouth at bedtime. For sleep  . Vitamin D, Ergocalciferol, (DRISDOL) 50000 UNITS CAPS capsule Take 1 capsule (50,000 Units total) by mouth every 7 (seven) days. On Wednesday: For bone health  . [DISCONTINUED] clonazePAM (KLONOPIN) 0.5 MG tablet Take 1 tablet (0.5 mg total) by mouth 2 (two) times daily. For anxiety/insomnia  . [DISCONTINUED] mirtazapine (REMERON SOL-TAB) 30 MG disintegrating tablet Take 1 tablet (30 mg total) by mouth at bedtime. For depression/insomnia  . [DISCONTINUED] OLANZapine zydis (ZYPREXA) 15 MG disintegrating tablet Take 1 tablet (15 mg total) by mouth at bedtime. For Insomnia, depression/mood control  . [DISCONTINUED] temazepam (RESTORIL) 7.5 MG capsule Take 1 capsule (7.5 mg total) by mouth at bedtime as needed for sleep.  . [DISCONTINUED] traZODone (DESYREL) 100 MG tablet Take 1 tablet (100 mg total) by mouth at bedtime. For sleep    Recent Results (from the past 2160 hour(s))  Troponin I     Status: None   Collection Time: 10/13/14  9:53 AM  Result Value Ref Range   Troponin I <0.30 <0.30 ng/mL    Comment:        Due to the release kinetics of cTnI, a negative result within the first hours of the onset of symptoms does not rule out myocardial infarction with certainty. If myocardial infarction is still suspected, repeat the test at appropriate intervals.  CBC     Status: Abnormal    Collection Time: 10/13/14  9:53 AM  Result Value Ref Range   WBC 6.2 4.0 - 10.5 K/uL   RBC 4.77 3.87 - 5.11 MIL/uL   Hemoglobin 14.3 12.0 - 15.0 g/dL   HCT 43.5 36.0 - 46.0 %   MCV 91.2 78.0 - 100.0 fL   MCH 30.0 26.0 - 34.0 pg   MCHC 32.9 30.0 - 36.0 g/dL   RDW 13.8 11.5 - 15.5 %   Platelets 136 (L) 150 - 400 K/uL  Basic metabolic panel     Status: Abnormal   Collection Time: 10/13/14  9:53 AM  Result Value Ref Range   Sodium 145 137 - 147 mEq/L   Potassium 3.5 (L) 3.7 - 5.3 mEq/L   Chloride 104 96 - 112 mEq/L   CO2 24 19 - 32 mEq/L   Glucose, Bld 137 (H) 70 - 99 mg/dL   BUN 4 (L) 6 - 23 mg/dL   Creatinine, Ser 0.61 0.50 - 1.10 mg/dL   Calcium 9.5 8.4 - 10.5 mg/dL   GFR calc non Af Amer >90 >90 mL/min   GFR calc Af Amer >90 >90 mL/min    Comment: (NOTE) The eGFR has been calculated using the  CKD EPI equation. This calculation has not been validated in all clinical situations. eGFR's persistently <90 mL/min signify possible Chronic Kidney Disease.   Anion gap 17 (H) 5 - 15  I-stat troponin, ED     Status: None   Collection Time: 10/13/14 10:05 AM  Result Value Ref Range   Troponin i, poc 0.00 0.00 - 0.08 ng/mL   Comment 3            Comment: Due to the release kinetics of cTnI, a negative result within the first hours of the onset of symptoms does not rule out myocardial infarction with certainty. If myocardial infarction is still suspected, repeat the test at appropriate intervals.  Urinalysis, Routine w reflex microscopic     Status: None   Collection Time: 10/13/14 12:58 PM  Result Value Ref Range   Color, Urine YELLOW YELLOW   APPearance CLEAR CLEAR   Specific Gravity, Urine 1.007 1.005 - 1.030   pH 7.5 5.0 - 8.0   Glucose, UA NEGATIVE NEGATIVE mg/dL   Hgb urine dipstick NEGATIVE NEGATIVE   Bilirubin Urine NEGATIVE NEGATIVE   Ketones, ur NEGATIVE NEGATIVE mg/dL   Protein, ur NEGATIVE NEGATIVE mg/dL   Urobilinogen, UA 0.2 0.0 - 1.0 mg/dL   Nitrite NEGATIVE  NEGATIVE   Leukocytes, UA NEGATIVE NEGATIVE    Comment: MICROSCOPIC NOT DONE ON URINES WITH NEGATIVE PROTEIN, BLOOD, LEUKOCYTES, NITRITE, OR GLUCOSE <1000 mg/dL.  Urine culture     Status: None   Collection Time: 10/13/14 12:58 PM  Result Value Ref Range   Specimen Description URINE, CATHETERIZED    Special Requests NONE    Culture  Setup Time      10/13/2014 17:54 Performed at [place]      75,000 COLONIES/ML Performed at [facility]   Culture      Multiple bacterial morphotypes present, none predominant. Suggest appropriate recollection if clinically indicated. Performed at [facility]   Report Status 10/14/2014 FINAL   Troponin I     Status: None   Collection Time: 10/13/14  1:54 PM  Result Value Ref Range   Troponin I <0.30 <0.30 ng/mL    Comment:        Due to the release kinetics of cTnI, a negative result within the first hours of the onset of symptoms does not rule out myocardial infarction with certainty. If myocardial infarction is still suspected, repeat the test at appropriate intervals.  CBC     Status: Abnormal   Collection Time: 10/15/14 11:25 PM  Result Value Ref Range   WBC 5.9 4.0 - 10.5 K/uL   RBC 4.61 3.87 - 5.11 MIL/uL   Hemoglobin 13.5 12.0 - 15.0 g/dL   HCT 41.4 36.0 - 46.0 %   MCV 89.8 78.0 - 100.0 fL   MCH 29.3 26.0 - 34.0 pg   MCHC 32.6 30.0 - 36.0 g/dL   RDW 13.9 11.5 - 15.5 %   Platelets 140 (L) 150 - 400 K/uL  Comprehensive metabolic panel     Status: Abnormal   Collection Time: 10/15/14 11:25 PM  Result Value Ref Range   Sodium 142 137 - 147 mEq/L   Potassium 3.2 (L) 3.7 - 5.3 mEq/L   Chloride 104 96 - 112 mEq/L   CO2 24 19 - 32 mEq/L   Glucose, Bld 128 (H) 70 - 99 mg/dL   BUN 8 6 - 23 mg/dL   Creatinine, Ser 0.68 0.50 - 1.10 mg/dL   Calcium  9.3 8.4 - 10.5 mg/dL   Total Protein 6.9 6.0 - 8.3 g/dL   Albumin 4.0 3.5 - 5.2 g/dL   AST 55 (H) 0 - 37 U/L   ALT 23 0 - 35 U/L   Alkaline  Phosphatase 74 39 - 117 U/L   Total Bilirubin 0.4 0.3 - 1.2 mg/dL   GFR calc non Af Amer >90 >90 mL/min   GFR calc Af Amer >90 >90 mL/min    Comment: (NOTE) The eGFR has been calculated using the CKD EPI equation. This calculation has not been validated in all clinical situations. eGFR's persistently <90 mL/min signify possible Chronic Kidney Disease.   Anion gap 14 5 - 15  Ethanol (ETOH)     Status: None   Collection Time: 10/15/14 11:25 PM  Result Value Ref Range   Alcohol, Ethyl (B) <11 0 - 11 mg/dL    Comment:        LOWEST DETECTABLE LIMIT FOR SERUM ALCOHOL IS 11 mg/dL FOR MEDICAL PURPOSES ONLY  Urine Drug Screen     Status: Abnormal   Collection Time: 10/15/14 11:31 PM  Result Value Ref Range   Opiates NONE DETECTED NONE DETECTED   Cocaine NONE DETECTED NONE DETECTED   Benzodiazepines POSITIVE (A) NONE DETECTED   Amphetamines NONE DETECTED NONE DETECTED   Tetrahydrocannabinol NONE DETECTED NONE DETECTED   Barbiturates POSITIVE (A) NONE DETECTED    Comment:        DRUG SCREEN FOR MEDICAL PURPOSES ONLY.  IF CONFIRMATION IS NEEDED FOR ANY PURPOSE, NOTIFY LAB WITHIN 5 DAYS.        LOWEST DETECTABLE LIMITS FOR URINE DRUG SCREEN Drug Class       Cutoff (ng/mL) Amphetamine      1000 Barbiturate      200 Benzodiazepine   735 Tricyclics       329 Opiates          300 Cocaine          300 THC              50  TSH     Status: None   Collection Time: 10/16/14  5:28 AM  Result Value Ref Range   TSH 2.870 0.350 - 4.500 uIU/mL    Comment: Performed at [facility]  Urine Drug Screen     Status: Abnormal   Collection Time: 11/02/14  4:52 PM  Result Value Ref Range   Opiates NONE DETECTED NONE DETECTED   Cocaine NONE DETECTED NONE DETECTED   Benzodiazepines POSITIVE (A) NONE DETECTED   Amphetamines NONE DETECTED NONE DETECTED   Tetrahydrocannabinol NONE DETECTED NONE DETECTED   Barbiturates POSITIVE (A) NONE DETECTED    Comment:        DRUG SCREEN FOR MEDICAL  PURPOSES ONLY.  IF CONFIRMATION IS NEEDED FOR ANY PURPOSE, NOTIFY LAB WITHIN 5 DAYS.        LOWEST DETECTABLE LIMITS FOR URINE DRUG SCREEN Drug Class       Cutoff (ng/mL) Amphetamine      1000 Barbiturate      200 Benzodiazepine   924 Tricyclics       268 Opiates          300 Cocaine          300 THC              50   Acetaminophen level     Status: None   Collection Time: 11/02/14  5:05 PM  Result Value Ref Range  Acetaminophen (Tylenol), Serum <15.0 10 - 30 ug/mL    Comment:        THERAPEUTIC CONCENTRATIONS VARY SIGNIFICANTLY. A RANGE OF 10-30 ug/mL MAY BE AN EFFECTIVE CONCENTRATION FOR MANY PATIENTS. HOWEVER, SOME ARE BEST TREATED AT CONCENTRATIONS OUTSIDE THIS RANGE. ACETAMINOPHEN CONCENTRATIONS >150 ug/mL AT 4 HOURS AFTER INGESTION AND >50 ug/mL AT 12 HOURS AFTER INGESTION ARE OFTEN ASSOCIATED WITH TOXIC REACTIONS.   CBC     Status: Abnormal   Collection Time: 11/02/14  5:05 PM  Result Value Ref Range   WBC 7.4 4.0 - 10.5 K/uL   RBC 5.25 (H) 3.87 - 5.11 MIL/uL   Hemoglobin 15.7 (H) 12.0 - 15.0 g/dL   HCT 46.5 (H) 36.0 - 46.0 %   MCV 88.6 78.0 - 100.0 fL   MCH 29.9 26.0 - 34.0 pg   MCHC 33.8 30.0 - 36.0 g/dL   RDW 13.6 11.5 - 15.5 %   Platelets 168 150 - 400 K/uL  Comprehensive metabolic panel     Status: Abnormal   Collection Time: 11/02/14  5:05 PM  Result Value Ref Range   Sodium 148 (H) 137 - 147 mEq/L   Potassium 3.4 (L) 3.7 - 5.3 mEq/L   Chloride 103 96 - 112 mEq/L   CO2 22 19 - 32 mEq/L   Glucose, Bld 163 (H) 70 - 99 mg/dL   BUN 19 6 - 23 mg/dL   Creatinine, Ser 0.81 0.50 - 1.10 mg/dL   Calcium 10.6 (H) 8.4 - 10.5 mg/dL   Total Protein 7.8 6.0 - 8.3 g/dL   Albumin 4.6 3.5 - 5.2 g/dL   AST 27 0 - 37 U/L   ALT 17 0 - 35 U/L   Alkaline Phosphatase 68 39 - 117 U/L   Total Bilirubin 0.5 0.3 - 1.2 mg/dL   GFR calc non Af Amer 76 (L) >90 mL/min   GFR calc Af Amer 88 (L) >90 mL/min    Comment: (NOTE) The eGFR has been calculated using the CKD  EPI equation. This calculation has not been validated in all clinical situations. eGFR's persistently <90 mL/min signify possible Chronic Kidney Disease.    Anion gap 23 (H) 5 - 15  Ethanol (ETOH)     Status: None   Collection Time: 11/02/14  5:05 PM  Result Value Ref Range   Alcohol, Ethyl (B) <11 0 - 11 mg/dL    Comment:        LOWEST DETECTABLE LIMIT FOR SERUM ALCOHOL IS 11 mg/dL FOR MEDICAL PURPOSES ONLY   Salicylate level     Status: Abnormal   Collection Time: 11/02/14  5:05 PM  Result Value Ref Range   Salicylate Lvl <1.7 (L) 2.8 - 20.0 mg/dL  Hemoglobin A1c     Status: Abnormal   Collection Time: 11/04/14  6:50 AM  Result Value Ref Range   Hgb A1c MFr Bld 5.8 (H) <5.7 %    Comment: (NOTE)                                                                       According to the ADA Clinical Practice Recommendations for 2011, when HbA1c is used as a screening test:  >=6.5%   Diagnostic of Diabetes Mellitus           (  if abnormal result is confirmed) 5.7-6.4%   Increased risk of developing Diabetes Mellitus References:Diagnosis and Classification of Diabetes Mellitus,Diabetes CBJS,2831,51(VOHYW 1):S62-S69 and Standards of Medical Care in         Diabetes - 2011,Diabetes VPXT,0626,94 (Suppl 1):S11-S61.    Mean Plasma Glucose 120 (H) <117 mg/dL    Comment: Performed at [facility]  Lipid panel     Status: Abnormal   Collection Time: 11/04/14  6:50 AM  Result Value Ref Range   Cholesterol 177 0 - 200 mg/dL   Triglycerides 79 <150 mg/dL   HDL 45 >39 mg/dL   Total CHOL/HDL Ratio 3.9 RATIO   VLDL 16 0 - 40 mg/dL   LDL Cholesterol 116 (H) 0 - 99 mg/dL    Comment:        Total Cholesterol/HDL:CHD Risk Coronary Heart Disease Risk Table                     Men   Women  1/2 Average Risk   3.4   3.3  Average Risk       5.0   4.4  2 X Average Risk   9.6   7.1  3 X Average Risk  23.4   11.0        Use the calculated Patient Ratio above and the CHD Risk Table to  determine the patient's CHD Risk.        ATP III CLASSIFICATION (LDL):  <100     mg/dL   Optimal  100-129  mg/dL   Near or Above                    Optimal  130-159  mg/dL   Borderline  160-189  mg/dL   High  >190     mg/dL   Very High Performed at [facility]   CBC     Status: Abnormal   Collection Time: 11/24/14  3:59 PM  Result Value Ref Range   WBC 4.8 4.0 - 10.5 K/uL   RBC 4.35 3.87 - 5.11 MIL/uL   Hemoglobin 12.6 12.0 - 15.0 g/dL   HCT 38.8 36.0 - 46.0 %   MCV 89.2 78.0 - 100.0 fL   MCH 29.0 26.0 - 34.0 pg   MCHC 32.5 30.0 - 36.0 g/dL   RDW 13.0 11.5 - 15.5 %   Platelets 135 (L) 150 - 400 K/uL  Comprehensive metabolic panel     Status: Abnormal   Collection Time: 11/24/14  3:59 PM  Result Value Ref Range   Sodium 139 137 - 147 mEq/L   Potassium 4.0 3.7 - 5.3 mEq/L   Chloride 102 96 - 112 mEq/L   CO2 22 19 - 32 mEq/L   Glucose, Bld 126 (H) 70 - 99 mg/dL   BUN 11 6 - 23 mg/dL   Creatinine, Ser 0.76 0.50 - 1.10 mg/dL   Calcium 9.7 8.4 - 10.5 mg/dL   Total Protein 7.1 6.0 - 8.3 g/dL   Albumin 4.1 3.5 - 5.2 g/dL   AST 22 0 - 37 U/L   ALT 21 0 - 35 U/L   Alkaline Phosphatase 70 39 - 117 U/L   Total Bilirubin 0.3 0.3 - 1.2 mg/dL   GFR calc non Af Amer 88 (L) >90 mL/min   GFR calc Af Amer >90 >90 mL/min    Comment: (NOTE) The eGFR has been calculated using the CKD EPI equation. This calculation has not been validated in all clinical  situations. eGFR's persistently <90 mL/min signify possible Chronic Kidney Disease.    Anion gap 15 5 - 15  I-stat troponin, ED     Status: None   Collection Time: 11/24/14  4:04 PM  Result Value Ref Range   Troponin i, poc 0.00 0.00 - 0.08 ng/mL   Comment 3            Comment: Due to the release kinetics of cTnI, a negative result within the first hours of the onset of symptoms does not rule out myocardial infarction with certainty. If myocardial infarction is still suspected, repeat the test at appropriate intervals.    Urinalysis, Routine w reflex microscopic     Status: Abnormal   Collection Time: 12/13/14  5:36 PM  Result Value Ref Range   Color, Urine AMBER (A) YELLOW    Comment: BIOCHEMICALS MAY BE AFFECTED BY COLOR   APPearance CLOUDY (A) CLEAR   Specific Gravity, Urine 1.028 1.005 - 1.030   pH 5.5 5.0 - 8.0   Glucose, UA NEGATIVE NEGATIVE mg/dL   Hgb urine dipstick NEGATIVE NEGATIVE   Bilirubin Urine SMALL (A) NEGATIVE   Ketones, ur NEGATIVE NEGATIVE mg/dL   Protein, ur NEGATIVE NEGATIVE mg/dL   Urobilinogen, UA 1.0 0.0 - 1.0 mg/dL   Nitrite NEGATIVE NEGATIVE   Leukocytes, UA SMALL (A) NEGATIVE  Drug screen panel, emergency     Status: Abnormal   Collection Time: 12/13/14  5:36 PM  Result Value Ref Range   Opiates NONE DETECTED NONE DETECTED   Cocaine NONE DETECTED NONE DETECTED   Benzodiazepines POSITIVE (A) NONE DETECTED   Amphetamines NONE DETECTED NONE DETECTED   Tetrahydrocannabinol NONE DETECTED NONE DETECTED   Barbiturates NONE DETECTED NONE DETECTED    Comment:        DRUG SCREEN FOR MEDICAL PURPOSES ONLY.  IF CONFIRMATION IS NEEDED FOR ANY PURPOSE, NOTIFY LAB WITHIN 5 DAYS.        LOWEST DETECTABLE LIMITS FOR URINE DRUG SCREEN Drug Class       Cutoff (ng/mL) Amphetamine      1000 Barbiturate      200 Benzodiazepine   537 Tricyclics       482 Opiates          300 Cocaine          300 THC              50   Urine microscopic-add on     Status: Abnormal   Collection Time: 12/13/14  5:36 PM  Result Value Ref Range   Squamous Epithelial / LPF RARE RARE   WBC, UA 3-6 <3 WBC/hpf   Bacteria, UA RARE RARE   Crystals CA OXALATE CRYSTALS (A) NEGATIVE   Urine-Other MUCOUS PRESENT   CBC     Status: Abnormal   Collection Time: 12/13/14  5:52 PM  Result Value Ref Range   WBC 5.6 4.0 - 10.5 K/uL   RBC 4.71 3.87 - 5.11 MIL/uL   Hemoglobin 14.0 12.0 - 15.0 g/dL   HCT 42.0 36.0 - 46.0 %   MCV 89.2 78.0 - 100.0 fL   MCH 29.7 26.0 - 34.0 pg   MCHC 33.3 30.0 - 36.0 g/dL   RDW  13.2 11.5 - 15.5 %   Platelets 142 (L) 150 - 400 K/uL  Comprehensive metabolic panel     Status: Abnormal   Collection Time: 12/13/14  5:52 PM  Result Value Ref Range   Sodium 140 135 - 145 mmol/L  Comment: Please note change in reference range.   Potassium 3.6 3.5 - 5.1 mmol/L    Comment: Please note change in reference range.   Chloride 110 96 - 112 mEq/L   CO2 22 19 - 32 mmol/L   Glucose, Bld 150 (H) 70 - 99 mg/dL   BUN 16 6 - 23 mg/dL   Creatinine, Ser 0.57 0.50 - 1.10 mg/dL   Calcium 9.1 8.4 - 10.5 mg/dL   Total Protein 7.2 6.0 - 8.3 g/dL   Albumin 4.5 3.5 - 5.2 g/dL   AST 24 0 - 37 U/L   ALT 12 0 - 35 U/L   Alkaline Phosphatase 67 39 - 117 U/L   Total Bilirubin 0.6 0.3 - 1.2 mg/dL   GFR calc non Af Amer >90 >90 mL/min   GFR calc Af Amer >90 >90 mL/min    Comment: (NOTE) The eGFR has been calculated using the CKD EPI equation. This calculation has not been validated in all clinical situations. eGFR's persistently <90 mL/min signify possible Chronic Kidney Disease.    Anion gap 8 5 - 15  Salicylate level     Status: None   Collection Time: 12/13/14  5:52 PM  Result Value Ref Range   Salicylate Lvl <3.0 2.8 - 20.0 mg/dL  Acetaminophen level     Status: Abnormal   Collection Time: 12/13/14  5:52 PM  Result Value Ref Range   Acetaminophen (Tylenol), Serum <10.0 (L) 10 - 30 ug/mL    Comment:        THERAPEUTIC CONCENTRATIONS VARY SIGNIFICANTLY. A RANGE OF 10-30 ug/mL MAY BE AN EFFECTIVE CONCENTRATION FOR MANY PATIENTS. HOWEVER, SOME ARE BEST TREATED AT CONCENTRATIONS OUTSIDE THIS RANGE. ACETAMINOPHEN CONCENTRATIONS >150 ug/mL AT 4 HOURS AFTER INGESTION AND >50 ug/mL AT 12 HOURS AFTER INGESTION ARE OFTEN ASSOCIATED WITH TOXIC REACTIONS.   Ethanol     Status: None   Collection Time: 12/13/14  5:52 PM  Result Value Ref Range   Alcohol, Ethyl (B) <5 0 - 9 mg/dL    Comment:        LOWEST DETECTABLE LIMIT FOR SERUM ALCOHOL IS 11 mg/dL FOR MEDICAL PURPOSES  ONLY   Urinalysis, Routine w reflex microscopic     Status: None   Collection Time: 12/19/14  3:25 PM  Result Value Ref Range   Color, Urine YELLOW YELLOW   APPearance CLEAR CLEAR   Specific Gravity, Urine 1.022 1.005 - 1.030   pH 7.0 5.0 - 8.0   Glucose, UA NEGATIVE NEGATIVE mg/dL   Hgb urine dipstick NEGATIVE NEGATIVE   Bilirubin Urine NEGATIVE NEGATIVE   Ketones, ur NEGATIVE NEGATIVE mg/dL   Protein, ur NEGATIVE NEGATIVE mg/dL   Urobilinogen, UA 0.2 0.0 - 1.0 mg/dL   Nitrite NEGATIVE NEGATIVE   Leukocytes, UA NEGATIVE NEGATIVE    Comment: MICROSCOPIC NOT DONE ON URINES WITH NEGATIVE PROTEIN, BLOOD, LEUKOCYTES, NITRITE, OR GLUCOSE <1000 mg/dL. Performed at [facility]       Constitutional:  BP 149/89 mmHg  Pulse 128  Ht 5' 3.5" (1.613 m)  Wt 159 lb 6.4 oz (72.303 kg)  BMI 27.79 kg/m2   Mental Status Examination;  Patient is casually dressed and fairly groomed.  Her speech is slow, clear and coherent.  Her thought process slow and logical.  She described her mood anxious and tired.  Her affect is constricted.  Her psychomotor activity is slightly decreased.  She denies any active or passive suicidal thoughts or homicidal thought.  There were no delusions,  paranoia or any obsessive thoughts.  Her attention and concentration is fair.  Her fund of knowledge is average.  She has difficulty remembering things.  She has no tremors or shakes.  She is alert and oriented 3.  Her insight, judgment and impulse control is fair.   Established Problem, Stable/Improving (1), Review of Psycho-Social Stressors (1), Review or order clinical lab tests (1), Review and summation of old records (2), Review of Last Therapy Session (1), Review of Medication Regimen & Side Effects (2) and Review of New Medication or Change in Dosage (2)  Assessment: Axis I: Maj. depressive disorder, recurrent severe, posttraumatic stress disorder, cognitive disorder NOS  Axis II: Deferred  Axis  III:  Past Medical History  Diagnosis Date  . High cholesterol   . Depression   . Anxiety   . GERD (gastroesophageal reflux disease)   . Insomnia   . Arthritis   . Memory loss     Axis IV: Moderate   Plan:  I reviewed discharge summary, recent blood work, current medication and psychosocial stressors.  She is taking multiple medication and recently trazodone was added and Klonopin was increased.  Patient is scheduled to have sleep study on 27th at [facility] neurology.  We will try temazepam for insomnia.  I will discontinue Klonopin at this time and try temazepam 15 mg at bedtime.  For now I will continue trazodone 100 mg at bedtime, Remeron 30 mg at bedtime , Zyprexa disagreed tablet 50 mg at bedtime .  I also encouraged to see a therapist in this office for coping and social skills however patient at this time not interested but she will consider before her next appointment.  I will see her again in 4 weeks.  Discussed medication side effects in detail including polypharmacy.  Time spent 25 minutes.  More than 50% of the time spent in psychoeducation, counseling and coordination of care.  Discuss safety plan that anytime having active suicidal thoughts or homicidal thoughts then patient need to call 911 or go to the local emergency room.   ARFEEN,SYED T., MD 12/28/2014

## 2014-12-31 NOTE — Telephone Encounter (Signed)
Met with Dr. Adele Schilder to discuss patient's complaint of still not sleeping at night and wanted an immediate follow up.  Dr. Adele Schilder requested this nurse call back to inform there was no other medication changes he could do at this time until patient had requested sleep study.  Patient was upset with this response and stated appointment for sleep study not until 01/12/15.  Agreed to call them to request sooner and will follow up with earlier office visit request at [facility] outpatient if problem persists until seen.

## 2014-12-31 NOTE — Telephone Encounter (Signed)
Patient called this morning stating medication changes that occurred on 12/28/14 office visit "are just not working".  Patient stated she is not sleeping and began medication changes that night.  Patient insists she has not slept at all in the past 3 days and requests a call back from provider for additional medication changes.  Reviewed medications with patient to ensure she was taking correctly and informed would pass on the information to provider.

## 2015-01-04 ENCOUNTER — Telehealth (HOSPITAL_COMMUNITY): Payer: Self-pay

## 2015-01-04 NOTE — Telephone Encounter (Signed)
Telephone message from patient again stating she is having ongoing problems with not sleeping and requests a call back from provider to discuss.  Patient states "I am about to go under" and has an appointment for sleep study on 01/12/15.  Please advise?

## 2015-01-04 NOTE — Telephone Encounter (Signed)
I returned patient's phone call.  Reinforced to keep her appointment the streets dirty.  At this time no changes in the medication.

## 2015-01-06 ENCOUNTER — Encounter (HOSPITAL_COMMUNITY): Payer: Self-pay | Admitting: *Deleted

## 2015-01-06 ENCOUNTER — Emergency Department (HOSPITAL_COMMUNITY)
Admission: EM | Admit: 2015-01-06 | Discharge: 2015-01-06 | Disposition: A | Discharge status: Home / Self Care | Payer: Medicare Other | Attending: Emergency Medicine | Admitting: Emergency Medicine

## 2015-01-06 DIAGNOSIS — H578 Other specified disorders of eye and adnexa: Secondary | ICD-10-CM | POA: Insufficient documentation

## 2015-01-06 DIAGNOSIS — F329 Major depressive disorder, single episode, unspecified: Secondary | ICD-10-CM | POA: Diagnosis not present

## 2015-01-06 DIAGNOSIS — E780 Pure hypercholesterolemia: Secondary | ICD-10-CM | POA: Diagnosis not present

## 2015-01-06 DIAGNOSIS — R000 Tachycardia, unspecified: Secondary | ICD-10-CM | POA: Diagnosis not present

## 2015-01-06 DIAGNOSIS — K219 Gastro-esophageal reflux disease without esophagitis: Secondary | ICD-10-CM | POA: Insufficient documentation

## 2015-01-06 DIAGNOSIS — Z8739 Personal history of other diseases of the musculoskeletal system and connective tissue: Secondary | ICD-10-CM | POA: Insufficient documentation

## 2015-01-06 DIAGNOSIS — Z79899 Other long term (current) drug therapy: Secondary | ICD-10-CM | POA: Diagnosis not present

## 2015-01-06 DIAGNOSIS — G4700 Insomnia, unspecified: Secondary | ICD-10-CM | POA: Insufficient documentation

## 2015-01-06 DIAGNOSIS — H5713 Ocular pain, bilateral: Secondary | ICD-10-CM

## 2015-01-06 DIAGNOSIS — H53143 Visual discomfort, bilateral: Secondary | ICD-10-CM | POA: Diagnosis not present

## 2015-01-06 DIAGNOSIS — F419 Anxiety disorder, unspecified: Secondary | ICD-10-CM | POA: Diagnosis not present

## 2015-01-06 MED ORDER — FLUORESCEIN SODIUM 1 MG OP STRP
1.0000 | ORAL_STRIP | Freq: Once | OPHTHALMIC | Status: AC
  Administered 2015-01-06: 1 via OPHTHALMIC
  Filled 2015-01-06: qty 1

## 2015-01-06 MED ORDER — PROPARACAINE HCL 0.5 % OP SOLN
1.0000 [drp] | Freq: Once | OPHTHALMIC | Status: AC
  Administered 2015-01-06: 1 [drp] via OPHTHALMIC
  Filled 2015-01-06: qty 15

## 2015-01-06 NOTE — ED Notes (Signed)
Bed: WA17 Expected date: 01/06/15 Expected time: 7:17 AM Means of arrival: Ambulance Comments: Eye pressure and insomnia

## 2015-01-06 NOTE — ED Notes (Addendum)
Pt c/o eye pressure since last night and insomnia for several months. She is currently being followed by psychiatrist, medication for sleep have been prescribed in the past without resolve. Pt reports she is supposed to participate in a sleep study, last completed x 10 years ago

## 2015-01-06 NOTE — ED Notes (Signed)
Pt c/o cont. Eye pain bilaterally, warm cloth applied per pt request

## 2015-01-06 NOTE — ED Notes (Signed)
Eye box, tono pen, and medications placed at bedside.

## 2015-01-06 NOTE — Discharge Instructions (Signed)
Insomnia Insomnia is frequent trouble falling and/or staying asleep. Insomnia can be a long term problem or a short term problem. Both are common. Insomnia can be a short term problem when the wakefulness is related to a certain stress or worry. Long term insomnia is often related to ongoing stress during waking hours and/or poor sleeping habits. Overtime, sleep deprivation itself can make the problem worse. Every little thing feels more severe because you are overtired and your ability to cope is decreased. CAUSES   Stress, anxiety, and depression.  Poor sleeping habits.  Distractions such as TV in the bedroom.  Naps close to bedtime.  Engaging in emotionally charged conversations before bed.  Technical reading before sleep.  Alcohol and other sedatives. They may make the problem worse. They can hurt normal sleep patterns and normal dream activity.  Stimulants such as caffeine for several hours prior to bedtime.  Pain syndromes and shortness of breath can cause insomnia.  Exercise late at night.  Changing time zones may cause sleeping problems (jet lag). It is sometimes helpful to have someone observe your sleeping patterns. They should look for periods of not breathing during the night (sleep apnea). They should also look to see how long those periods last. If you live alone or observers are uncertain, you can also be observed at a sleep clinic where your sleep patterns will be professionally monitored. Sleep apnea requires a checkup and treatment. Give your caregivers your medical history. Give your caregivers observations your family has made about your sleep.  SYMPTOMS   Not feeling rested in the morning.  Anxiety and restlessness at bedtime.  Difficulty falling and staying asleep. TREATMENT   Your caregiver may prescribe treatment for an underlying medical disorders. Your caregiver can give advice or help if you are using alcohol or other drugs for self-medication. Treatment  of underlying problems will usually eliminate insomnia problems.  Medications can be prescribed for short time use. They are generally not recommended for lengthy use.  Over-the-counter sleep medicines are not recommended for lengthy use. They can be habit forming.  You can promote easier sleeping by making lifestyle changes such as:  Using relaxation techniques that help with breathing and reduce muscle tension.  Exercising earlier in the day.  Changing your diet and the time of your last meal. No night time snacks.  Establish a regular time to go to bed.  Counseling can help with stressful problems and worry.  Soothing music and white noise may be helpful if there are background noises you cannot remove.  Stop tedious detailed work at least one hour before bedtime. HOME CARE INSTRUCTIONS   Keep a diary. Inform your caregiver about your progress. This includes any medication side effects. See your caregiver regularly. Take note of:  Times when you are asleep.  Times when you are awake during the night.  The quality of your sleep.  How you feel the next day. This information will help your caregiver care for you.  Get out of bed if you are still awake after 15 minutes. Read or do some quiet activity. Keep the lights down. Wait until you feel sleepy and go back to bed.  Keep regular sleeping and waking hours. Avoid naps.  Exercise regularly.  Avoid distractions at bedtime. Distractions include watching television or engaging in any intense or detailed activity like attempting to balance the household checkbook.  Develop a bedtime ritual. Keep a familiar routine of bathing, brushing your teeth, climbing into bed at the same   time each night, listening to soothing music. Routines increase the success of falling to sleep faster.  Use relaxation techniques. This can be using breathing and muscle tension release routines. It can also include visualizing peaceful scenes. You can  also help control troubling or intruding thoughts by keeping your mind occupied with boring or repetitive thoughts like the old concept of counting sheep. You can make it more creative like imagining planting one beautiful flower after another in your backyard garden.  During your day, work to eliminate stress. When this is not possible use some of the previous suggestions to help reduce the anxiety that accompanies stressful situations. MAKE SURE YOU:   Understand these instructions.  Will watch your condition.  Will get help right away if you are not doing well or get worse. Document Released: 11/30/2000 Document Revised: 02/25/2012 Document Reviewed: 12/31/2007 Exit[employer] Patient Information 2015 [employer]. This information is not intended to replace advice given to you by your health care provider. Make sure you discuss any questions you have with your health care provider.  

## 2015-01-06 NOTE — ED Notes (Signed)
Per EMS from home, ambulatory on seen c/o insomnia and eye pressure starting last night. Pt reports being seen prior for sx without diagnosis. Pt states pressure in eyes some better with O2

## 2015-01-06 NOTE — ED Notes (Signed)
MD at bedside. 

## 2015-01-06 NOTE — ED Provider Notes (Signed)
CSN: [id number]     Arrival date & time 01/06/15  0973 History   First MD Initiated Contact with Patient 01/06/15 [phone]     Chief Complaint  Patient presents with  . Eye Problem     Patient is a 64 y.o. female presenting with eye problem. The history is provided by the patient. No language interpreter was used.  Eye Problem  Kendra Simmons presents with extreme fatigue and burning in her eyes.  This has been present for several days.  Sxs are constant.  No alleviating or exacerbating symptoms.  Pt has a hx/o insomnia.  No vision changes.  She had similar sxs previously and they were alleviated with oxygen.  No fevers, cough, vomiting, diarrhea, chest pain, no hematochezia or melena.  She states this is related to her insomnia, which she has had for months.  She saw an eye doctor for these symptoms three weeks ago and was told there is nothing wrong with her eyes.  Symptoms are mild, constant, worsening.  Past Medical History  Diagnosis Date  . High cholesterol   . Depression   . Anxiety   . GERD (gastroesophageal reflux disease)   . Insomnia   . Arthritis   . Memory loss    Past Surgical History  Procedure Laterality Date  . Cosmetic surgery    . Cesarean section    . Laproscopy     Family History  Problem Relation Age of Onset  . Sleep apnea Father   . Alcohol abuse Father   . Diabetes Mother   . Diabetes Sister   . Diabetes Maternal Uncle   . Diabetes Cousin    History  Substance Use Topics  . Smoking status: Never Smoker   . Smokeless tobacco: Never Used  . Alcohol Use: No   OB History    No data available     Review of Systems  All other systems reviewed and are negative.     Allergies  Review of patient's allergies indicates no known allergies.  Home Medications   Prior to Admission medications   Medication Sig Start Date End Date Taking? Authorizing Provider  cyanocobalamin (,VITAMIN B-12,) 1000 MCG/ML injection Inject 1 mL (1,000 mcg total) into the  skin once a week. For bone health 12/20/14   Encarnacion Slates, NP  mirtazapine (REMERON SOL-TAB) 15 MG disintegrating tablet Take 1 tablet (15 mg total) by mouth at bedtime. For depression/insomnia 12/28/14   [name], MD  olanzapine zydis (ZYPREXA) 15 MG disintegrating tablet Take 1 tablet (15 mg total) by mouth at bedtime. For Insomnia, depression/mood control 12/28/14   [name], MD  omeprazole (PRILOSEC) 20 MG capsule Take 1 capsule (20 mg total) by mouth daily. For acid reflux 12/20/14   Encarnacion Slates, NP  polyvinyl alcohol (LIQUIFILM TEARS) 1.4 % ophthalmic solution Place 1 drop into both eyes as needed for dry eyes. 12/20/14   Encarnacion Slates, NP  pravastatin (PRAVACHOL) 20 MG tablet Take 1 tablet (20 mg total) by mouth at bedtime. For high cholesterol 12/20/14   Encarnacion Slates, NP  tamsulosin (FLOMAX) 0.4 MG CAPS capsule Take 1 capsule (0.4 mg total) by mouth daily after supper. For frequent urination/urgency 12/20/14   Encarnacion Slates, NP  temazepam (RESTORIL) 15 MG capsule Take 1 capsule (15 mg total) by mouth at bedtime as needed for sleep. 12/28/14   [name], MD  traZODone (DESYREL) 100 MG tablet Take 1 tablet (100 mg total) by mouth  at bedtime. For sleep 12/28/14   [name], MD  Vitamin D, Ergocalciferol, (DRISDOL) 50000 UNITS CAPS capsule Take 1 capsule (50,000 Units total) by mouth every 7 (seven) days. On Wednesday: For bone health 12/20/14   Encarnacion Slates, NP   BP 139/84 mmHg  Pulse 108  Temp(Src) 97.5 F (36.4 C) (Oral)  Resp 20  SpO2 96% Physical Exam  Constitutional: She is oriented to person, place, and time. She appears well-developed and well-nourished.  HENT:  Head: Normocephalic and atraumatic.  Eyes: EOM are normal. Pupils are equal, round, and reactive to light.  There is no uptake on fluorescein staining. No corneal abrasion.  Cardiovascular: Normal rate and regular rhythm.   Pulmonary/Chest: Effort normal. No respiratory distress.  Abdominal: Soft. There is no  tenderness. There is no rebound and no guarding.  Musculoskeletal: She exhibits no edema or tenderness.  Neurological: She is alert and oriented to person, place, and time.  Skin: Skin is warm and dry.  Psychiatric: She has a normal mood and affect. Her behavior is normal.  Nursing note and vitals reviewed.   ED Course  Procedures (including critical care time) Labs Review Labs Reviewed - No data to display  Imaging Review No results found.   EKG Interpretation None      MDM   Final diagnoses:  Eye discomfort, bilateral    Patient has a psychiatric history as well as history of eye discomfort that has been worked up by ophthalmology without any source identified. Clinical picture is not consistent with acute glaucoma, corneal abrasion, tumor, conjunctivitis. Discussed with patient PCP follow-up and ophthalmology follow-up.    Quintella Reichert, MD 01/06/15 [phone]

## 2015-01-10 ENCOUNTER — Telehealth (HOSPITAL_COMMUNITY): Payer: Self-pay

## 2015-01-10 ENCOUNTER — Ambulatory Visit (HOSPITAL_COMMUNITY): Payer: Medicare Other | Admitting: Clinical

## 2015-01-10 NOTE — Telephone Encounter (Signed)
Telephone call back with patient to inform Dr. Adele Schilder responded to my text that he had read my message and did not want to change any more medications until patient obtains a sleep study.  Patient stated frustration with this being the case and reported "how am I going to do a sleep study when I can't sleep?".  Patient admitted she no showed for therapy appointment today "because I couldn't drive since I haven't slept".  Patient denied any current suicidal ideations but did state "hopelessness" and not sure how she could keep going without sleep.  Requested patient keep sleep study appointment and to follow up with emergency services or as a walk-in at [facility] if any thoughts of harming herself or others occurred.  Requested patient call the police if began to have these thoughts and could not come in to be seen.  Patient reluctantly in agreement with Dr. Marguerite Olea current plan and discussed things to possibly try to help with sleep.  Patient will call back with any continued worsening symptoms.   No current SI/HI.

## 2015-01-10 NOTE — Telephone Encounter (Signed)
Patient called at 8:00am stating she had not slept in 7 days and could not continue to go on without sleep.  Patient requested this nurse attempt to contact Dr. Adele Schilder to request he call her to discuss ongoing problems with not sleeping and requested need for a medication change.  Patient stated "this is really becoming an emergency as I cannot continue to go without sleep".  Informed patient I would send Dr. Adele Schilder a note to his in box with request he call her back but that I could not guarantee he would see it since he is scheduled off today after working all weekend.  Patient stated understanding and no current suicidal or homicidal ideations expressed.  Patient's phone number [phone].  Requests Dr. Adele Schilder allow it to ring a few times for her to get to the phone if he calls back today?

## 2015-01-11 ENCOUNTER — Telehealth (HOSPITAL_COMMUNITY): Payer: Self-pay

## 2015-01-11 ENCOUNTER — Other Ambulatory Visit (HOSPITAL_COMMUNITY): Payer: Self-pay | Admitting: Psychiatry

## 2015-01-11 ENCOUNTER — Telehealth: Payer: Self-pay | Admitting: Neurology

## 2015-01-11 DIAGNOSIS — G4720 Circadian rhythm sleep disorder, unspecified type: Secondary | ICD-10-CM

## 2015-01-11 NOTE — Telephone Encounter (Signed)
Please call patient with appointment for study and confirm that the study was discussed between [place] and Dr. Brett Fairy, she is to bring her night time meds to the lab.

## 2015-01-11 NOTE — Telephone Encounter (Signed)
I returned patient's phone call she continues to complain poor sleep and now she is saying that she call the sleep study office and she may not able to get sleep study because she cannot sleep.  I spoke to Dr. Erenest Rasher who is neurologist and she scheduled to have sleep study tomorrow.  Discuss patient's underlying condition and also provide that she has admitted few times because of lack of sleep.  I'm not sure her perception of sleep as patient's sister mentioned that she sleeps okay at home.  Also when she was admitted to behavioral [place] visit documentation from the nursing that she is sleeping okay.  Patient is currently taking Remeron, Zyprexa, temazepam and trazodone.  I do believe patient should have sleep study before we change or adjust any medication.  Her neurologist agreed to have a sleep study tomorrow.  I called the patient again encouraged her to keep appointment for sleep study.  Patient agreed that she will keep the appointment tomorrow for sleep study.

## 2015-01-11 NOTE — Telephone Encounter (Signed)
Patient requesting to cancel sleep study due to her inability to sleep.  Says that she has been up for 8 days and feels that it would be pointless to come for the sleep study.  She has requested that I call Dr. Adele Schilder in an attempt to get him to provide her with additional sleep medication in which I declined.  I did advise the patient that I would send notification of her situation to Dr. Brett Fairy for advisement, in which she requested if I could get Dr. Brett Fairy to call Dr. Adele Schilder to encourage him to provide her with additional medication to sleep.    During this phone note, our office received a call from Dr. Adele Schilder in which he was transferred to Dr. Brett Fairy to discuss.

## 2015-01-11 NOTE — Telephone Encounter (Signed)
Trazodone, zyprexa, stopped remeron 15 mg , seroquel was not helpful, doxepine.  Her sister insists she sleeps well.  Sleep perception may be off- needs sleep study per dr Adele Schilder. ,

## 2015-01-12 ENCOUNTER — Ambulatory Visit (INDEPENDENT_AMBULATORY_CARE_PROVIDER_SITE_OTHER): Payer: Medicare Other | Admitting: Neurology

## 2015-01-12 VITALS — BP 125/105 | HR 98

## 2015-01-12 DIAGNOSIS — G479 Sleep disorder, unspecified: Secondary | ICD-10-CM

## 2015-01-12 DIAGNOSIS — R0683 Snoring: Secondary | ICD-10-CM

## 2015-01-12 DIAGNOSIS — G478 Other sleep disorders: Secondary | ICD-10-CM

## 2015-01-12 DIAGNOSIS — R634 Abnormal weight loss: Secondary | ICD-10-CM

## 2015-01-12 DIAGNOSIS — F331 Major depressive disorder, recurrent, moderate: Secondary | ICD-10-CM

## 2015-01-12 DIAGNOSIS — G4700 Insomnia, unspecified: Secondary | ICD-10-CM

## 2015-01-12 DIAGNOSIS — M2619 Other specified anomalies of jaw-cranial base relationship: Secondary | ICD-10-CM

## 2015-01-12 DIAGNOSIS — F5105 Insomnia due to other mental disorder: Secondary | ICD-10-CM

## 2015-01-13 ENCOUNTER — Telehealth (HOSPITAL_COMMUNITY): Payer: Self-pay | Admitting: *Deleted

## 2015-01-13 ENCOUNTER — Other Ambulatory Visit (HOSPITAL_COMMUNITY): Payer: Self-pay | Admitting: Psychiatry

## 2015-01-13 NOTE — Telephone Encounter (Signed)
This Probation officer called pt. Per Dr Adele Schilder, we are awaiting sleep study results and are unable to prescribe any sleep medications until results have been received and reviewed.

## 2015-01-13 NOTE — Telephone Encounter (Signed)
Pt called stating she had sleep study completed on last evening. Pt would like for you to call her to discuss results and to refill medications. Please advise

## 2015-01-13 NOTE — Sleep Study (Signed)
Please see the scanned sleep study interpretation located in the Procedure tab within the chart review section

## 2015-01-17 NOTE — Telephone Encounter (Signed)
Patient called stating she has now not slept in 12 days and would like to speak to Dr. Adele Schilder about ongoing problems with insomnia.  Patient attended sleep study 01/12/15.  Next evaluation set for 01/31/15.

## 2015-01-17 NOTE — Telephone Encounter (Signed)
Met with Dr. Adele Schilder to discuss patient's report of still not sleeping.  Could not access report from sleep study and per request of Dr. Adele Schilder called patient to request a copy of the finalized sleep study report.  Spoke to patient's power of attorney, Ms. Nichols, patient's sister as she will help track down the sleep study report and send to Dr. Adele Schilder.  Gave fax number and also offered option for patient to come to our office to sign a consent so we could request.  Patient sister will have report sent to Dr. Adele Schilder as soon as possible.

## 2015-01-20 ENCOUNTER — Telehealth: Payer: Self-pay | Admitting: *Deleted

## 2015-01-20 NOTE — Telephone Encounter (Signed)
Dr. Adele Schilder  Patient called and left message on nurse's voicemail. Patient states that she has not slept in 12 days and she needs to speak with you. I called Dr. Edwena Felty office and the sleep study has not been read yet. Dr. Brett Fairy will read today and we should have results of sleep study today.

## 2015-01-20 NOTE — Telephone Encounter (Signed)
Kendra Simmons with Dr. Marguerite Olea office calling checking on results of Sleep Study.  Pt calling office stating not able to sleep for 12 days.  [phone].  Relayed Dr. Brett Fairy reading sleep studies today.

## 2015-01-21 ENCOUNTER — Encounter: Payer: Self-pay | Admitting: Neurology

## 2015-01-21 ENCOUNTER — Telehealth: Payer: Self-pay | Admitting: *Deleted

## 2015-01-21 NOTE — Telephone Encounter (Signed)
The patient's power of attorney [name] was contacted and notified that Dr. Berniece Andreas had been faxed a copy of Dr. Edwena Felty report.  She was informed that Dr. Brett Fairy was requesting a follow up visit to go over the results of Ms. Fildes's sleep study report.  She was unable to schedule while on the phone and stated she would call back to schedule the appointment.

## 2015-01-21 NOTE — Telephone Encounter (Signed)
Study was read and today reports were send  by Shanon Brow plemmons.

## 2015-01-22 ENCOUNTER — Emergency Department (HOSPITAL_COMMUNITY)
Admission: EM | Admit: 2015-01-22 | Discharge: 2015-01-22 | Disposition: A | Discharge status: Home / Self Care | Payer: Medicare Other | Attending: Emergency Medicine | Admitting: Emergency Medicine

## 2015-01-22 ENCOUNTER — Encounter (HOSPITAL_COMMUNITY): Payer: Self-pay

## 2015-01-22 DIAGNOSIS — E780 Pure hypercholesterolemia: Secondary | ICD-10-CM | POA: Diagnosis not present

## 2015-01-22 DIAGNOSIS — M1990 Unspecified osteoarthritis, unspecified site: Secondary | ICD-10-CM | POA: Diagnosis not present

## 2015-01-22 DIAGNOSIS — M6281 Muscle weakness (generalized): Secondary | ICD-10-CM | POA: Insufficient documentation

## 2015-01-22 DIAGNOSIS — L299 Pruritus, unspecified: Secondary | ICD-10-CM | POA: Diagnosis not present

## 2015-01-22 DIAGNOSIS — G4700 Insomnia, unspecified: Secondary | ICD-10-CM | POA: Diagnosis not present

## 2015-01-22 DIAGNOSIS — R5383 Other fatigue: Secondary | ICD-10-CM | POA: Diagnosis not present

## 2015-01-22 DIAGNOSIS — R531 Weakness: Secondary | ICD-10-CM | POA: Diagnosis not present

## 2015-01-22 DIAGNOSIS — R55 Syncope and collapse: Secondary | ICD-10-CM | POA: Diagnosis not present

## 2015-01-22 DIAGNOSIS — K219 Gastro-esophageal reflux disease without esophagitis: Secondary | ICD-10-CM | POA: Diagnosis not present

## 2015-01-22 DIAGNOSIS — R42 Dizziness and giddiness: Secondary | ICD-10-CM | POA: Diagnosis not present

## 2015-01-22 LAB — COMPREHENSIVE METABOLIC PANEL
ALT: 11 U/L (ref 0–35)
AST: 20 U/L (ref 0–37)
Albumin: 4.3 g/dL (ref 3.5–5.2)
Alkaline Phosphatase: 50 U/L (ref 39–117)
Anion gap: 12 (ref 5–15)
BILIRUBIN TOTAL: 1 mg/dL (ref 0.3–1.2)
BUN: 19 mg/dL (ref 6–23)
CO2: 24 mmol/L (ref 19–32)
Calcium: 9.3 mg/dL (ref 8.4–10.5)
Chloride: 105 mmol/L (ref 96–112)
Creatinine, Ser: 0.67 mg/dL (ref 0.50–1.10)
GFR calc Af Amer: >90 mL/min (ref >90)
GFR calc non Af Amer: >90 mL/min (ref >90)
GLUCOSE: 88 mg/dL (ref 70–99)
POTASSIUM: 3.5 mmol/L (ref 3.5–5.1)
Sodium: 141 mmol/L (ref 135–145)
Total Protein: 7 g/dL (ref 6.0–8.3)

## 2015-01-22 LAB — URINALYSIS, ROUTINE W REFLEX MICROSCOPIC
GLUCOSE, UA: NEGATIVE mg/dL
Hgb urine dipstick: NEGATIVE
Ketones, ur: >80 mg/dL — AB
NITRITE: NEGATIVE
Protein, ur: NEGATIVE mg/dL
SPECIFIC GRAVITY, URINE: 1.031 — AB (ref 1.005–1.030)
Urobilinogen, UA: 1 mg/dL (ref 0.0–1.0)
pH: 6 (ref 5.0–8.0)

## 2015-01-22 LAB — CBC WITH DIFFERENTIAL/PLATELET
BASOS PCT: 0 % (ref 0–1)
Basophils Absolute: 0 10*3/uL (ref 0.0–0.1)
Eosinophils Absolute: 0.1 10*3/uL (ref 0.0–0.7)
Eosinophils Relative: 1 % (ref 0–5)
HCT: 42.1 % (ref 36.0–46.0)
Hemoglobin: 13.9 g/dL (ref 12.0–15.0)
LYMPHS ABS: 1.5 10*3/uL (ref 0.7–4.0)
LYMPHS PCT: 24 % (ref 12–46)
MCH: 29.9 pg (ref 26.0–34.0)
MCHC: 33 g/dL (ref 30.0–36.0)
MCV: 90.5 fL (ref 78.0–100.0)
MONOS PCT: 6 % (ref 3–12)
Monocytes Absolute: 0.4 10*3/uL (ref 0.1–1.0)
Neutro Abs: 4.4 10*3/uL (ref 1.7–7.7)
Neutrophils Relative %: 69 % (ref 43–77)
Platelets: 140 10*3/uL — ABNORMAL LOW (ref 150–400)
RBC: 4.65 MIL/uL (ref 3.87–5.11)
RDW: 13.1 % (ref 11.5–15.5)
WBC: 6.4 10*3/uL (ref 4.0–10.5)

## 2015-01-22 LAB — TROPONIN I: Troponin I: <0.03 ng/mL (ref <0.031)

## 2015-01-22 LAB — URINE MICROSCOPIC-ADD ON

## 2015-01-22 LAB — PHOSPHORUS: PHOSPHORUS: 3.5 mg/dL (ref 2.3–4.6)

## 2015-01-22 LAB — MAGNESIUM: Magnesium: 2.1 mg/dL (ref 1.5–2.5)

## 2015-01-22 MED ORDER — FLECAINIDE ACETATE 100 MG PO TABS
200.0000 mg | ORAL_TABLET | ORAL | Status: DC | PRN
Start: 2015-01-22 — End: 2015-01-22

## 2015-01-22 MED ORDER — SODIUM CHLORIDE 0.9 % IV BOLUS (SEPSIS)
1000.0000 mL | Freq: Once | INTRAVENOUS | Status: AC
  Administered 2015-01-22: 1000 mL via INTRAVENOUS

## 2015-01-22 MED ORDER — DILTIAZEM HCL ER COATED BEADS 120 MG PO CP24: 120.0000 mg | ORAL_CAPSULE | Freq: Every day | ORAL | Status: DC

## 2015-01-22 NOTE — ED Notes (Signed)
She c/o insomnia and multiple aches and pains; all of which coincided with the prescribing of psychotropic meds about a month ago.  She is in no distress.  She tells [place] her mother was recently recommended to hospice and this is a great stressor.

## 2015-01-22 NOTE — ED Notes (Signed)
Bed: QP59 Expected date: 01/22/15 Expected time: 1:51 PM Means of arrival: Ambulance Comments: Dizzy, loss of sleep and appetite, eyes feel like sand is in them

## 2015-01-22 NOTE — Discharge Instructions (Signed)
We saw you in the ER for All the results in the ER are normal, labs and imaging. We are not sure what is causing your symptoms. The workup in the ER is not complete, and is limited to screening for life threatening and emergent conditions only, so please see a primary care doctor for further evaluation.   Fatigue Fatigue is a feeling of tiredness, lack of energy, lack of motivation, or feeling tired all the time. Having enough rest, good nutrition, and reducing stress will normally reduce fatigue. Consult your caregiver if it persists. The nature of your fatigue will help your caregiver to find out its cause. The treatment is based on the cause.  CAUSES  There are many causes for fatigue. Most of the time, fatigue can be traced to one or more of your habits or routines. Most causes fit into one or more of three general areas. They are: Lifestyle problems  Sleep disturbances.  Overwork.  Physical exertion.  Unhealthy habits.  Poor eating habits or eating disorders.  Alcohol and/or drug use .  Lack of proper nutrition (malnutrition). Psychological problems  Stress and/or anxiety problems.  Depression.  Grief.  Boredom. Medical Problems or Conditions  Anemia.  Pregnancy.  Thyroid gland problems.  Recovery from major surgery.  Continuous pain.  Emphysema or asthma that is not well controlled  Allergic conditions.  Diabetes.  Infections (such as mononucleosis).  Obesity.  Sleep disorders, such as sleep apnea.  Heart failure or other heart-related problems.  Cancer.  Kidney disease.  Liver disease.  Effects of certain medicines such as antihistamines, cough and cold remedies, prescription pain medicines, heart and blood pressure medicines, drugs used for treatment of cancer, and some antidepressants. SYMPTOMS  The symptoms of fatigue include:   Lack of energy.  Lack of drive (motivation).  Drowsiness.  Feeling of indifference to the  surroundings. DIAGNOSIS  The details of how you feel help guide your caregiver in finding out what is causing the fatigue. You will be asked about your present and past health condition. It is important to review all medicines that you take, including prescription and non-prescription items. A thorough exam will be done. You will be questioned about your feelings, habits, and normal lifestyle. Your caregiver may suggest blood tests, urine tests, or other tests to look for common medical causes of fatigue.  TREATMENT  Fatigue is treated by correcting the underlying cause. For example, if you have continuous pain or depression, treating these causes will improve how you feel. Similarly, adjusting the dose of certain medicines will help in reducing fatigue.  HOME CARE INSTRUCTIONS   Try to get the required amount of good sleep every night.  Eat a healthy and nutritious diet, and drink enough water throughout the day.  Practice ways of relaxing (including yoga or meditation).  Exercise regularly.  Make plans to change situations that cause stress. Act on those plans so that stresses decrease over time. Keep your work and personal routine reasonable.  Avoid street drugs and minimize use of alcohol.  Start taking a daily multivitamin after consulting your caregiver. SEEK MEDICAL CARE IF:   You have persistent tiredness, which cannot be accounted for.  You have fever.  You have unintentional weight loss.  You have headaches.  You have disturbed sleep throughout the night.  You are feeling sad.  You have constipation.  You have dry skin.  You have gained weight.  You are taking any new or different medicines that you suspect are causing  fatigue.  You are unable to sleep at night.  You develop any unusual swelling of your legs or other parts of your body. SEEK IMMEDIATE MEDICAL CARE IF:   You are feeling confused.  Your vision is blurred.  You feel faint or pass out.  You  develop severe headache.  You develop severe abdominal, pelvic, or back pain.  You develop chest pain, shortness of breath, or an irregular or fast heartbeat.  You are unable to pass a normal amount of urine.  You develop abnormal bleeding such as bleeding from the rectum or you vomit blood.  You have thoughts about harming yourself or committing suicide.  You are worried that you might harm someone else. MAKE SURE YOU:   Understand these instructions.  Will watch your condition.  Will get help right away if you are not doing well or get worse. Document Released: 09/30/2007 Document Revised: 02/25/2012 Document Reviewed: 04/06/2014 [facility] Patient Information 2015 [place], [place]. This information is not intended to replace advice given to you by your health care provider. Make sure you discuss any questions you have with your health care provider.  Insomnia Insomnia is frequent trouble falling and/or staying asleep. Insomnia can be a long term problem or a short term problem. Both are common. Insomnia can be a short term problem when the wakefulness is related to a certain stress or worry. Long term insomnia is often related to ongoing stress during waking hours and/or poor sleeping habits. Overtime, sleep deprivation itself can make the problem worse. Every little thing feels more severe because you are overtired and your ability to cope is decreased. CAUSES   Stress, anxiety, and depression.  Poor sleeping habits.  Distractions such as TV in the bedroom.  Naps close to bedtime.  Engaging in emotionally charged conversations before bed.  Technical reading before sleep.  Alcohol and other sedatives. They may make the problem worse. They can hurt normal sleep patterns and normal dream activity.  Stimulants such as caffeine for several hours prior to bedtime.  Pain syndromes and shortness of breath can cause insomnia.  Exercise late at night.  Changing time zones may  cause sleeping problems (jet lag). It is sometimes helpful to have someone observe your sleeping patterns. They should look for periods of not breathing during the night (sleep apnea). They should also look to see how long those periods last. If you live alone or observers are uncertain, you can also be observed at a sleep clinic where your sleep patterns will be professionally monitored. Sleep apnea requires a checkup and treatment. Give your caregivers your medical history. Give your caregivers observations your family has made about your sleep.  SYMPTOMS   Not feeling rested in the morning.  Anxiety and restlessness at bedtime.  Difficulty falling and staying asleep. TREATMENT   Your caregiver may prescribe treatment for an underlying medical disorders. Your caregiver can give advice or help if you are using alcohol or other drugs for self-medication. Treatment of underlying problems will usually eliminate insomnia problems.  Medications can be prescribed for short time use. They are generally not recommended for lengthy use.  Over-the-counter sleep medicines are not recommended for lengthy use. They can be habit forming.  You can promote easier sleeping by making lifestyle changes such as:  Using relaxation techniques that help with breathing and reduce muscle tension.  Exercising earlier in the day.  Changing your diet and the time of your last meal. No night time snacks.  Establish a regular  time to go to bed.  Counseling can help with stressful problems and worry.  Soothing music and white noise may be helpful if there are background noises you cannot remove.  Stop tedious detailed work at least one hour before bedtime. HOME CARE INSTRUCTIONS   Keep a diary. Inform your caregiver about your progress. This includes any medication side effects. See your caregiver regularly. Take note of:  Times when you are asleep.  Times when you are awake during the night.  The quality  of your sleep.  How you feel the next day. This information will help your caregiver care for you.  Get out of bed if you are still awake after 15 minutes. Read or do some quiet activity. Keep the lights down. Wait until you feel sleepy and go back to bed.  Keep regular sleeping and waking hours. Avoid naps.  Exercise regularly.  Avoid distractions at bedtime. Distractions include watching television or engaging in any intense or detailed activity like attempting to balance the household checkbook.  Develop a bedtime ritual. Keep a familiar routine of bathing, brushing your teeth, climbing into bed at the same time each night, listening to soothing music. Routines increase the success of falling to sleep faster.  Use relaxation techniques. This can be using breathing and muscle tension release routines. It can also include visualizing peaceful scenes. You can also help control troubling or intruding thoughts by keeping your mind occupied with boring or repetitive thoughts like the old concept of counting sheep. You can make it more creative like imagining planting one beautiful flower after another in your backyard garden.  During your day, work to eliminate stress. When this is not possible use some of the previous suggestions to help reduce the anxiety that accompanies stressful situations. MAKE SURE YOU:   Understand these instructions.  Will watch your condition.  Will get help right away if you are not doing well or get worse. Document Released: 11/30/2000 Document Revised: 02/25/2012 Document Reviewed: 12/31/2007 [facility] Patient Information 2015 [place], [place]. This information is not intended to replace advice given to you by your health care provider. Make sure you discuss any questions you have with your health care provider.

## 2015-01-24 ENCOUNTER — Telehealth (HOSPITAL_COMMUNITY): Payer: Self-pay

## 2015-01-24 ENCOUNTER — Other Ambulatory Visit (HOSPITAL_COMMUNITY): Payer: Self-pay | Admitting: Psychiatry

## 2015-01-24 NOTE — Telephone Encounter (Signed)
I returned phone call.  Patient is focused that she had not slept in 13 days and she need medication for sleep.  I told that as per electronic medical record sleep study results has been given to her power of attorney (sister) and she supposed to schedule a follow-up appointment to discuss about the results finding.  Patient will call her sister who has power of attorney and follow-up.  I recommended not to start any new medication medications until we have the results.  We will also try to get sleep study results from her neurologist.

## 2015-01-24 NOTE — Telephone Encounter (Signed)
Telephone call with patient, per request of Dr. Adele Schilder, after Dr. Adele Schilder reviewed patient's recent sleep study results, to inform patient of his recommendation for her to follow up with the Sleep Clinic at [facility] [phone]).  Informed patient of the primary results of the study as was diagnosed delayed sleep onset but not related to sleep apnea or significant periodic limb movements that result in sleep disruption.  Informed at present Dr. Adele Schilder wanted her to follow up with the Sleep Clinic as their report suggested and no current medications being prescribed for sleep as patient was able to show sleep hours during the sleep study.  Patient stated understanding and agreed to follow up with the Sleep Clinic.  Phone number was given to patient with plan for patient to call back if problems persisted or if she needed Dr. Adele Schilder to review or discuss options further and patient agreed.  Patient's next evaluation with Dr. Adele Schilder set for 01/31/15.

## 2015-01-24 NOTE — Telephone Encounter (Signed)
I provided the results of her study to her POA, [name] last week.  She was instructed to call back to schedule a follow up appointment.  See my phone note.  DP

## 2015-01-24 NOTE — Telephone Encounter (Signed)
Sleep study results are available.

## 2015-01-25 NOTE — ED Provider Notes (Signed)
CSN: [id number]     Arrival date & time 01/22/15  1356 History   First MD Initiated Contact with Patient 01/22/15 1409     Chief Complaint  Patient presents with  . Insomnia     (Consider location/radiation/quality/duration/timing/severity/associated sxs/prior Treatment) HPI Comments: 64 y/o comes to the ER with cc of weakness, and inability to sleep. Pt hasnt slept well in several days. She is taking remeron and zyprexa, and has been calling her psychiatrist, but hasnt heard back from them. She has tried [place] with no succuss in the past. Has increased stress in her life currently, as mother in hospice. She lives by herself, sister is supportive. Also has malaise, fatigue, weakness and generalized pain. Pt denies nausea, emesis, fevers, chills, chest pains, shortness of breath, headaches, abdominal pain, uti like symptoms, headaches.   The history is provided by the patient.    Past Medical History  Diagnosis Date  . High cholesterol   . Depression   . Anxiety   . GERD (gastroesophageal reflux disease)   . Insomnia   . Arthritis   . Memory loss    Past Surgical History  Procedure Laterality Date  . Cosmetic surgery    . Cesarean section    . Laproscopy     Family History  Problem Relation Age of Onset  . Sleep apnea Father   . Alcohol abuse Father   . Diabetes Mother   . Diabetes Sister   . Diabetes Maternal Uncle   . Diabetes Cousin    History  Substance Use Topics  . Smoking status: Never Smoker   . Smokeless tobacco: Never Used  . Alcohol Use: No   OB History    No data available     Review of Systems  Constitutional: Positive for activity change and fatigue.  Eyes: Positive for itching.  Respiratory: Negative for shortness of breath.   Cardiovascular: Negative for chest pain.  Gastrointestinal: Negative for nausea, vomiting and abdominal pain.  Genitourinary: Negative for dysuria.  Musculoskeletal: Negative for neck pain.  Neurological: Negative for  headaches.      Allergies  Review of patient's allergies indicates no known allergies.  Home Medications   Prior to Admission medications   Medication Sig Start Date End Date Taking? Authorizing Provider  acetaminophen (TYLENOL) 500 MG tablet Take 1,000 mg by mouth every 6 (six) hours as needed for mild pain or headache.   Yes Historical Provider, MD  clonazePAM (KLONOPIN) 0.5 MG tablet Take 0.5 mg by mouth at bedtime.  12/20/14  Yes Historical Provider, MD  cyanocobalamin (,VITAMIN B-12,) 1000 MCG/ML injection Inject 1 mL (1,000 mcg total) into the skin once a week. For bone health 12/20/14  Yes Encarnacion Slates, NP  mirtazapine (REMERON SOL-TAB) 15 MG disintegrating tablet Take 1 tablet (15 mg total) by mouth at bedtime. For depression/insomnia Patient taking differently: Take 30 mg by mouth at bedtime. For depression/insomnia 12/28/14  Yes [name], MD  olanzapine zydis (ZYPREXA) 15 MG disintegrating tablet Take 1 tablet (15 mg total) by mouth at bedtime. For Insomnia, depression/mood control 12/28/14  Yes [name], MD  omeprazole (PRILOSEC) 20 MG capsule Take 1 capsule (20 mg total) by mouth daily. For acid reflux 12/20/14  Yes Encarnacion Slates, NP  pravastatin (PRAVACHOL) 20 MG tablet Take 1 tablet (20 mg total) by mouth at bedtime. For high cholesterol 12/20/14  Yes Encarnacion Slates, NP  tamsulosin (FLOMAX) 0.4 MG CAPS capsule Take 1 capsule (0.4 mg total) by  mouth daily after supper. For frequent urination/urgency 12/20/14  Yes Encarnacion Slates, NP  temazepam (RESTORIL) 15 MG capsule Take 1 capsule (15 mg total) by mouth at bedtime as needed for sleep. 12/28/14  Yes [name], MD  traZODone (DESYREL) 100 MG tablet Take 1 tablet (100 mg total) by mouth at bedtime. For sleep 12/28/14  Yes [name], MD  Vitamin D, Ergocalciferol, (DRISDOL) 50000 UNITS CAPS capsule Take 1 capsule (50,000 Units total) by mouth every 7 (seven) days. On Wednesday: For bone health 12/20/14  Yes Encarnacion Slates, NP   polyvinyl alcohol (LIQUIFILM TEARS) 1.4 % ophthalmic solution Place 1 drop into both eyes as needed for dry eyes. Patient not taking: Reported on 01/22/2015 12/20/14   Encarnacion Slates, NP   BP 131/67 mmHg  Pulse 75  Temp(Src) 98 F (36.7 C) (Oral)  Resp 15  SpO2 98% Physical Exam  Constitutional: She is oriented to person, place, and time. She appears well-developed and well-nourished.  HENT:  Head: Normocephalic and atraumatic.  Eyes: Conjunctivae and EOM are normal. Pupils are equal, round, and reactive to light. Right eye exhibits no discharge. Left eye exhibits no discharge. No scleral icterus.  Neck: Neck supple.  Cardiovascular: Normal rate, regular rhythm and normal heart sounds.   No murmur heard. Pulmonary/Chest: Effort normal. No respiratory distress.  Abdominal: Soft. She exhibits no distension. There is no tenderness. There is no rebound and no guarding.  Neurological: She is alert and oriented to person, place, and time.  Skin: Skin is warm and dry.  Nursing note and vitals reviewed.   ED Course  Procedures (including critical care time) Labs Review Labs Reviewed  CBC WITH DIFFERENTIAL/PLATELET - Abnormal; Notable for the following:    Platelets 140 (*)    All other components within normal limits  URINALYSIS, ROUTINE W REFLEX MICROSCOPIC - Abnormal; Notable for the following:    Color, Urine AMBER (*)    Specific Gravity, Urine 1.031 (*)    Bilirubin Urine MODERATE (*)    Ketones, ur >80 (*)    Leukocytes, UA SMALL (*)    All other components within normal limits  COMPREHENSIVE METABOLIC PANEL  TROPONIN I  MAGNESIUM  PHOSPHORUS  URINE MICROSCOPIC-ADD ON    Imaging Review No results found.   EKG Interpretation   Date/Time:  Saturday January 22 2015 14:03:16 EST Ventricular Rate:  80 PR Interval:  129 QRS Duration: 78 QT Interval:  396 QTC Calculation: 457 R Axis:   33 Text Interpretation:  Sinus rhythm Abnormal R-wave progression, early  transition  No significant change since last tracing Confirmed by Jaymien Landin,  MD, [name] [phone]) on 01/22/2015 2:59:37 PM      MDM   Final diagnoses:  Generalized weakness  Insomnia    PT with generalized weakness and insomnia as cc. Her exam is benign. She has no focal complains and no focal findings on our exam. LAbs are reassuring, there are 0 sirs criteria and no elyte abn.  Seems like her sx might be stemming from increased stress and poor sleep. Will ask her to see her pcp and psych for optimal diagnostic workup.  Varney Biles, MD 01/25/15 1320

## 2015-01-26 ENCOUNTER — Telehealth (HOSPITAL_COMMUNITY): Payer: Self-pay

## 2015-01-27 NOTE — Telephone Encounter (Signed)
I tried to return her phone call however she does not have any voicemail set up and I was unable to leave any message.

## 2015-01-31 ENCOUNTER — Ambulatory Visit (HOSPITAL_COMMUNITY): Payer: Self-pay | Admitting: Psychiatry

## 2015-02-01 ENCOUNTER — Ambulatory Visit (INDEPENDENT_AMBULATORY_CARE_PROVIDER_SITE_OTHER): Payer: Medicare Other | Admitting: Psychiatry

## 2015-02-01 ENCOUNTER — Encounter (HOSPITAL_COMMUNITY): Payer: Self-pay | Admitting: Psychiatry

## 2015-02-01 VITALS — BP 121/80 | HR 78 | Ht 64.0 in | Wt 153.4 lb

## 2015-02-01 DIAGNOSIS — F4310 Post-traumatic stress disorder, unspecified: Secondary | ICD-10-CM | POA: Diagnosis not present

## 2015-02-01 DIAGNOSIS — F331 Major depressive disorder, recurrent, moderate: Secondary | ICD-10-CM | POA: Diagnosis not present

## 2015-02-01 DIAGNOSIS — F09 Unspecified mental disorder due to known physiological condition: Secondary | ICD-10-CM | POA: Diagnosis not present

## 2015-02-01 MED ORDER — CLONAZEPAM 0.5 MG PO TABS: 0.5000 mg | ORAL_TABLET | Freq: Every day | ORAL | Status: DC

## 2015-02-01 MED ORDER — MIRTAZAPINE 30 MG PO TBDP: 30.0000 mg | ORAL_TABLET | Freq: Every day | ORAL | Status: DC

## 2015-02-01 MED ORDER — OLANZAPINE 20 MG PO TBDP: 20.0000 mg | ORAL_TABLET | Freq: Every day | ORAL | Status: DC

## 2015-02-01 NOTE — Telephone Encounter (Signed)
I returned patient's phone call but she did not pick up the phone at home and then I called her sister's phone.  Patient is scheduled to see me this afternoon.  I encouraged her sister to come with the patient so we can discuss further options.  Her sister has not scheduled appointment of the patient with the neurologist who did sleep studies.  However she is going to try today.

## 2015-02-01 NOTE — Progress Notes (Signed)
[facility] [phone] Progress Note   Kendra Simmons [id number] 63 y.o.  02/01/2015 2:08 PM  Chief Complaint:  I'm very tired because I cannot sleep.       History of Present Illness:  Kendra Simmons came for her follow-up appointment with her sister.  In past few months she visited multiple times to the psychiatric emergency room with complaining of insomnia and she had called multiple times that she has unable to sleep .  Finally she had sleep study done and she scheduled to see her neurologist this Friday to discuss the results.  She is taking temazepam, trazodone, Zyprexa, Remeron and Klonopin and she feel that medicine is not working and she is getting very tired because lack of sleep.  She reported her eyes are puffy and she could not sleep.  However she denies any irritability, agitation, anger or any crying spells.  She denies any suicidal thoughts but admitted lack of sleep making her very sad and disappointment.  She likes Zyprexa and she believe Zyprexa helped some of the sleep.  Patient denies any hallucination or any paranoia.  Her sister endorse that sometimes she does not like taking trazodone because it does not help as much.  She reported her appetite is okay and her vitals are stable.  She denies drinking or using any illegal substances.  On the last visit I recommended to see therapist but appointment was not scheduled.  Patient denies any tremors, shakes or any EPS.  Her sleep study shows delayed sleep onset but there were no sleep apnea or any significant periodic limb movement.  She had appointment with her neurologist to discuss more about results.    Suicidal Ideation: No Plan Formed: No Patient has means to carry out plan: No  Homicidal Ideation: No Plan Formed: No Patient has means to carry out plan: No  Past Psychiatric History/Hospitalization(s) Patient has at least 4 psychiatric hospitalizations.  Her last hospitalization was January 2016 .  Earlier she was  admitted in November 2015 at behavioral [facility] because of suicidal thoughts.  She has been admitted to [facility] and multiple hospitalization at [facility].  She has done once intensive outpatient program.  She has a history of hallucination, paranoia, severe depression. In the past she has seen Kendra Simmons, Dr Kendra Simmons and Dr Kendra Simmons.  As per chart she has taken in the past doxepin, Xanax, Seroquel, amitriptyline, imipramine, Wellbutrin, Prozac and Vistaril.     Anxiety: Yes Bipolar Disorder: No Depression: Yes Mania: No Psychosis: History of hallucinations Schizophrenia: No Personality Disorder: No Hospitalization for psychiatric illness: Yes History of Electroconvulsive Shock Therapy: No Prior Suicide Attempts: No  Medical History; Patient has high cholesterol, memory impairment, GERD and palpitation.  She has history of frequent falls and seen by a neurologist in the past.  Her primary care physician is Dr. Rex Simmons in [place].  Review of Systems  Constitutional: Positive for malaise/fatigue.  Neurological: Negative for headaches.  Psychiatric/Behavioral: The patient is nervous/anxious and has insomnia.        Memory impairment   Psychiatric: Agitation: No Hallucination: No Depressed Mood: No Insomnia: Yes Hypersomnia: No Altered Concentration: No Feels Worthless: No Grandiose Ideas: No Belief In Special Powers: No New/Increased Substance Abuse: No Compulsions: No  Neurologic: Headache: No Seizure: No Paresthesias: Yes   Musculoskeletal: Strength & Muscle Tone: within normal limits Gait & Station: normal Patient leans: N/A   Outpatient Encounter Prescriptions as of 02/01/2015  Medication Sig  . acetaminophen (  TYLENOL) 500 MG tablet Take 1,000 mg by mouth every 6 (six) hours as needed for mild pain or headache.  . clonazePAM (KLONOPIN) 0.5 MG tablet Take 1 tablet (0.5 mg total) by mouth at bedtime.  . cyanocobalamin (,VITAMIN B-12,)  1000 MCG/ML injection Inject 1 mL (1,000 mcg total) into the skin once a week. For bone health  . mirtazapine (REMERON SOL-TAB) 30 MG disintegrating tablet Take 1 tablet (30 mg total) by mouth at bedtime. For depression/insomnia  . olanzapine zydis (ZYPREXA) 20 MG disintegrating tablet Take 1 tablet (20 mg total) by mouth at bedtime. For Insomnia, depression/mood control  . omeprazole (PRILOSEC) 20 MG capsule Take 1 capsule (20 mg total) by mouth daily. For acid reflux  . polyvinyl alcohol (LIQUIFILM TEARS) 1.4 % ophthalmic solution Place 1 drop into both eyes as needed for dry eyes.  . pravastatin (PRAVACHOL) 20 MG tablet Take 1 tablet (20 mg total) by mouth at bedtime. For high cholesterol  . Vitamin D, Ergocalciferol, (DRISDOL) 50000 UNITS CAPS capsule Take 1 capsule (50,000 Units total) by mouth every 7 (seven) days. On Wednesday: For bone health  . tamsulosin (FLOMAX) 0.4 MG CAPS capsule Take 1 capsule (0.4 mg total) by mouth daily after supper. For frequent urination/urgency (Patient not taking: Reported on 02/01/2015)  . [DISCONTINUED] clonazePAM (KLONOPIN) 0.5 MG tablet Take 0.5 mg by mouth at bedtime.   . [DISCONTINUED] mirtazapine (REMERON SOL-TAB) 15 MG disintegrating tablet Take 1 tablet (15 mg total) by mouth at bedtime. For depression/insomnia (Patient taking differently: Take 30 mg by mouth at bedtime. For depression/insomnia)  . [DISCONTINUED] olanzapine zydis (ZYPREXA) 15 MG disintegrating tablet Take 1 tablet (15 mg total) by mouth at bedtime. For Insomnia, depression/mood control  . [DISCONTINUED] temazepam (RESTORIL) 15 MG capsule Take 1 capsule (15 mg total) by mouth at bedtime as needed for sleep.  . [DISCONTINUED] traZODone (DESYREL) 100 MG tablet Take 1 tablet (100 mg total) by mouth at bedtime. For sleep    Recent Results (from the past 2160 hour(s))  Hemoglobin A1c     Status: Abnormal   Collection Time: 11/04/14  6:50 AM  Result Value Ref Range   Hgb A1c MFr Bld 5.8 (H)  <5.7 %    Comment: (NOTE)                                                                       According to the ADA Clinical Practice Recommendations for 2011, when HbA1c is used as a screening test:  >=6.5%   Diagnostic of Diabetes Mellitus           (if abnormal result is confirmed) 5.7-6.4%   Increased risk of developing Diabetes Mellitus References:Diagnosis and Classification of Diabetes Mellitus,Diabetes WKMQ,2863,81(RRNHA 1):S62-S69 and Standards of Medical Care in         Diabetes - 2011,Diabetes Care,2011,34 (Suppl 1):S11-S61.    Mean Plasma Glucose 120 (H) <117 mg/dL    Comment: Performed at [facility]  Lipid panel     Status: Abnormal   Collection Time: 11/04/14  6:50 AM  Result Value Ref Range   Cholesterol 177 0 - 200 mg/dL   Triglycerides 79 <150 mg/dL   HDL 45 >39 mg/dL   Total CHOL/HDL Ratio 3.9 RATIO  VLDL 16 0 - 40 mg/dL   LDL Cholesterol 116 (H) 0 - 99 mg/dL    Comment:        Total Cholesterol/HDL:CHD Risk Coronary Heart Disease Risk Table                     Men   Women  1/2 Average Risk   3.4   3.3  Average Risk       5.0   4.4  2 X Average Risk   9.6   7.1  3 X Average Risk  23.4   11.0        Use the calculated Patient Ratio above and the CHD Risk Table to determine the patient's CHD Risk.        ATP III CLASSIFICATION (LDL):  <100     mg/dL   Optimal  100-129  mg/dL   Near or Above                    Optimal  130-159  mg/dL   Borderline  160-189  mg/dL   High  >190     mg/dL   Very High Performed at [facility]   CBC     Status: Abnormal   Collection Time: 11/24/14  3:59 PM  Result Value Ref Range   WBC 4.8 4.0 - 10.5 K/uL   RBC 4.35 3.87 - 5.11 MIL/uL   Hemoglobin 12.6 12.0 - 15.0 g/dL   HCT 38.8 36.0 - 46.0 %   MCV 89.2 78.0 - 100.0 fL   MCH 29.0 26.0 - 34.0 pg   MCHC 32.5 30.0 - 36.0 g/dL   RDW 13.0 11.5 - 15.5 %   Platelets 135 (L) 150 - 400 K/uL  Comprehensive metabolic panel     Status: Abnormal   Collection  Time: 11/24/14  3:59 PM  Result Value Ref Range   Sodium 139 137 - 147 mEq/L   Potassium 4.0 3.7 - 5.3 mEq/L   Chloride 102 96 - 112 mEq/L   CO2 22 19 - 32 mEq/L   Glucose, Bld 126 (H) 70 - 99 mg/dL   BUN 11 6 - 23 mg/dL   Creatinine, Ser 0.76 0.50 - 1.10 mg/dL   Calcium 9.7 8.4 - 10.5 mg/dL   Total Protein 7.1 6.0 - 8.3 g/dL   Albumin 4.1 3.5 - 5.2 g/dL   AST 22 0 - 37 U/L   ALT 21 0 - 35 U/L   Alkaline Phosphatase 70 39 - 117 U/L   Total Bilirubin 0.3 0.3 - 1.2 mg/dL   GFR calc non Af Amer 88 (L) >90 mL/min   GFR calc Af Amer >90 >90 mL/min    Comment: (NOTE) The eGFR has been calculated using the CKD EPI equation. This calculation has not been validated in all clinical situations. eGFR's persistently <90 mL/min signify possible Chronic Kidney Disease.    Anion gap 15 5 - 15  I-stat troponin, ED     Status: None   Collection Time: 11/24/14  4:04 PM  Result Value Ref Range   Troponin i, poc 0.00 0.00 - 0.08 ng/mL   Comment 3            Comment: Due to the release kinetics of cTnI, a negative result within the first hours of the onset of symptoms does not rule out myocardial infarction with certainty. If myocardial infarction is still suspected, repeat the test at appropriate intervals.   Urinalysis, Routine w  reflex microscopic     Status: Abnormal   Collection Time: 12/13/14  5:36 PM  Result Value Ref Range   Color, Urine AMBER (A) YELLOW    Comment: BIOCHEMICALS MAY BE AFFECTED BY COLOR   APPearance CLOUDY (A) CLEAR   Specific Gravity, Urine 1.028 1.005 - 1.030   pH 5.5 5.0 - 8.0   Glucose, UA NEGATIVE NEGATIVE mg/dL   Hgb urine dipstick NEGATIVE NEGATIVE   Bilirubin Urine SMALL (A) NEGATIVE   Ketones, ur NEGATIVE NEGATIVE mg/dL   Protein, ur NEGATIVE NEGATIVE mg/dL   Urobilinogen, UA 1.0 0.0 - 1.0 mg/dL   Nitrite NEGATIVE NEGATIVE   Leukocytes, UA SMALL (A) NEGATIVE  Drug screen panel, emergency     Status: Abnormal   Collection Time: 12/13/14  5:36 PM   Result Value Ref Range   Opiates NONE DETECTED NONE DETECTED   Cocaine NONE DETECTED NONE DETECTED   Benzodiazepines POSITIVE (A) NONE DETECTED   Amphetamines NONE DETECTED NONE DETECTED   Tetrahydrocannabinol NONE DETECTED NONE DETECTED   Barbiturates NONE DETECTED NONE DETECTED    Comment:        DRUG SCREEN FOR MEDICAL PURPOSES ONLY.  IF CONFIRMATION IS NEEDED FOR ANY PURPOSE, NOTIFY LAB WITHIN 5 DAYS.        LOWEST DETECTABLE LIMITS FOR URINE DRUG SCREEN Drug Class       Cutoff (ng/mL) Amphetamine      1000 Barbiturate      200 Benzodiazepine   573 Tricyclics       220 Opiates          300 Cocaine          300 THC              50   Urine microscopic-add on     Status: Abnormal   Collection Time: 12/13/14  5:36 PM  Result Value Ref Range   Squamous Epithelial / LPF RARE RARE   WBC, UA 3-6 <3 WBC/hpf   Bacteria, UA RARE RARE   Crystals CA OXALATE CRYSTALS (A) NEGATIVE   Urine-Other MUCOUS PRESENT   CBC     Status: Abnormal   Collection Time: 12/13/14  5:52 PM  Result Value Ref Range   WBC 5.6 4.0 - 10.5 K/uL   RBC 4.71 3.87 - 5.11 MIL/uL   Hemoglobin 14.0 12.0 - 15.0 g/dL   HCT 42.0 36.0 - 46.0 %   MCV 89.2 78.0 - 100.0 fL   MCH 29.7 26.0 - 34.0 pg   MCHC 33.3 30.0 - 36.0 g/dL   RDW 13.2 11.5 - 15.5 %   Platelets 142 (L) 150 - 400 K/uL  Comprehensive metabolic panel     Status: Abnormal   Collection Time: 12/13/14  5:52 PM  Result Value Ref Range   Sodium 140 135 - 145 mmol/L    Comment: Please note change in reference range.   Potassium 3.6 3.5 - 5.1 mmol/L    Comment: Please note change in reference range.   Chloride 110 96 - 112 mEq/L   CO2 22 19 - 32 mmol/L   Glucose, Bld 150 (H) 70 - 99 mg/dL   BUN 16 6 - 23 mg/dL   Creatinine, Ser 0.57 0.50 - 1.10 mg/dL   Calcium 9.1 8.4 - 10.5 mg/dL   Total Protein 7.2 6.0 - 8.3 g/dL   Albumin 4.5 3.5 - 5.2 g/dL   AST 24 0 - 37 U/L   ALT 12 0 - 35 U/L   Alkaline Phosphatase  67 39 - 117 U/L   Total Bilirubin 0.6  0.3 - 1.2 mg/dL   GFR calc non Af Amer >90 >90 mL/min   GFR calc Af Amer >90 >90 mL/min    Comment: (NOTE) The eGFR has been calculated using the CKD EPI equation. This calculation has not been validated in all clinical situations. eGFR's persistently <90 mL/min signify possible Chronic Kidney Disease.    Anion gap 8 5 - 15  Salicylate level     Status: None   Collection Time: 12/13/14  5:52 PM  Result Value Ref Range   Salicylate Lvl <2.4 2.8 - 20.0 mg/dL  Acetaminophen level     Status: Abnormal   Collection Time: 12/13/14  5:52 PM  Result Value Ref Range   Acetaminophen (Tylenol), Serum <10.0 (L) 10 - 30 ug/mL    Comment:        THERAPEUTIC CONCENTRATIONS VARY SIGNIFICANTLY. A RANGE OF 10-30 ug/mL MAY BE AN EFFECTIVE CONCENTRATION FOR MANY PATIENTS. HOWEVER, SOME ARE BEST TREATED AT CONCENTRATIONS OUTSIDE THIS RANGE. ACETAMINOPHEN CONCENTRATIONS >150 ug/mL AT 4 HOURS AFTER INGESTION AND >50 ug/mL AT 12 HOURS AFTER INGESTION ARE OFTEN ASSOCIATED WITH TOXIC REACTIONS.   Ethanol     Status: None   Collection Time: 12/13/14  5:52 PM  Result Value Ref Range   Alcohol, Ethyl (B) <5 0 - 9 mg/dL    Comment:        LOWEST DETECTABLE LIMIT FOR SERUM ALCOHOL IS 11 mg/dL FOR MEDICAL PURPOSES ONLY   Urinalysis, Routine w reflex microscopic     Status: None   Collection Time: 12/19/14  3:25 PM  Result Value Ref Range   Color, Urine YELLOW YELLOW   APPearance CLEAR CLEAR   Specific Gravity, Urine 1.022 1.005 - 1.030   pH 7.0 5.0 - 8.0   Glucose, UA NEGATIVE NEGATIVE mg/dL   Hgb urine dipstick NEGATIVE NEGATIVE   Bilirubin Urine NEGATIVE NEGATIVE   Ketones, ur NEGATIVE NEGATIVE mg/dL   Protein, ur NEGATIVE NEGATIVE mg/dL   Urobilinogen, UA 0.2 0.0 - 1.0 mg/dL   Nitrite NEGATIVE NEGATIVE   Leukocytes, UA NEGATIVE NEGATIVE    Comment: MICROSCOPIC NOT DONE ON URINES WITH NEGATIVE PROTEIN, BLOOD, LEUKOCYTES, NITRITE, OR GLUCOSE <1000 mg/dL. Performed at [facility]   CBC with Differential/Platelet     Status: Abnormal   Collection Time: 01/22/15  2:13 PM  Result Value Ref Range   WBC 6.4 4.0 - 10.5 K/uL   RBC 4.65 3.87 - 5.11 MIL/uL   Hemoglobin 13.9 12.0 - 15.0 g/dL   HCT 42.1 36.0 - 46.0 %   MCV 90.5 78.0 - 100.0 fL   MCH 29.9 26.0 - 34.0 pg   MCHC 33.0 30.0 - 36.0 g/dL   RDW 13.1 11.5 - 15.5 %   Platelets 140 (L) 150 - 400 K/uL   Neutrophils Relative % 69 43 - 77 %   Neutro Abs 4.4 1.7 - 7.7 K/uL   Lymphocytes Relative 24 12 - 46 %   Lymphs Abs 1.5 0.7 - 4.0 K/uL   Monocytes Relative 6 3 - 12 %   Monocytes Absolute 0.4 0.1 - 1.0 K/uL   Eosinophils Relative 1 0 - 5 %   Eosinophils Absolute 0.1 0.0 - 0.7 K/uL   Basophils Relative 0 0 - 1 %   Basophils Absolute 0.0 0.0 - 0.1 K/uL  Comprehensive metabolic panel     Status: None   Collection Time: 01/22/15  2:13 PM  Result Value  Ref Range   Sodium 141 135 - 145 mmol/L   Potassium 3.5 3.5 - 5.1 mmol/L   Chloride 105 96 - 112 mmol/L   CO2 24 19 - 32 mmol/L   Glucose, Bld 88 70 - 99 mg/dL   BUN 19 6 - 23 mg/dL   Creatinine, Ser 0.67 0.50 - 1.10 mg/dL   Calcium 9.3 8.4 - 10.5 mg/dL   Total Protein 7.0 6.0 - 8.3 g/dL   Albumin 4.3 3.5 - 5.2 g/dL   AST 20 0 - 37 U/L   ALT 11 0 - 35 U/L   Alkaline Phosphatase 50 39 - 117 U/L   Total Bilirubin 1.0 0.3 - 1.2 mg/dL   GFR calc non Af Amer >90 >90 mL/min   GFR calc Af Amer >90 >90 mL/min    Comment: (NOTE) The eGFR has been calculated using the CKD EPI equation. This calculation has not been validated in all clinical situations. eGFR's persistently <90 mL/min signify possible Chronic Kidney Disease.    Anion gap 12 5 - 15  Troponin I     Status: None   Collection Time: 01/22/15  2:13 PM  Result Value Ref Range   Troponin I <0.03 <0.031 ng/mL    Comment:        NO INDICATION OF MYOCARDIAL INJURY.   Magnesium     Status: None   Collection Time: 01/22/15  2:13 PM  Result Value Ref Range   Magnesium 2.1 1.5 - 2.5 mg/dL   Phosphorus     Status: None   Collection Time: 01/22/15  2:13 PM  Result Value Ref Range   Phosphorus 3.5 2.3 - 4.6 mg/dL  Urinalysis, Routine w reflex microscopic     Status: Abnormal   Collection Time: 01/22/15  2:35 PM  Result Value Ref Range   Color, Urine AMBER (A) YELLOW    Comment: BIOCHEMICALS MAY BE AFFECTED BY COLOR   APPearance CLEAR CLEAR   Specific Gravity, Urine 1.031 (H) 1.005 - 1.030   pH 6.0 5.0 - 8.0   Glucose, UA NEGATIVE NEGATIVE mg/dL   Hgb urine dipstick NEGATIVE NEGATIVE   Bilirubin Urine MODERATE (A) NEGATIVE   Ketones, ur >80 (A) NEGATIVE mg/dL   Protein, ur NEGATIVE NEGATIVE mg/dL   Urobilinogen, UA 1.0 0.0 - 1.0 mg/dL   Nitrite NEGATIVE NEGATIVE   Leukocytes, UA SMALL (A) NEGATIVE  Urine microscopic-add on     Status: None   Collection Time: 01/22/15  2:35 PM  Result Value Ref Range   Squamous Epithelial / LPF RARE RARE   WBC, UA 3-6 <3 WBC/hpf   RBC / HPF 0-2 <3 RBC/hpf      Constitutional:  BP 121/80 mmHg  Pulse 78  Ht 5' 4" (1.626 m)  Wt 153 lb 6.4 oz (69.582 kg)  BMI 26.32 kg/m2   Mental Status Examination;  Patient is casually dressed and fairly groomed. She appears tired and fatigued.  Her speech is slow, clear and coherent.  Her thought process slow and logical.  She described her mood tired and tired.  Her affect is constricted.  Her psychomotor activity is slightly decreased.  She denies any active or passive suicidal thoughts or homicidal thought.  There were no delusions, paranoia or any obsessive thoughts.  Her attention and concentration is fair.  Her fund of knowledge is average.  She has difficulty remembering things.  She has no tremors or shakes.  She is alert and oriented 3.  Her insight, judgment and impulse control  is fair.   Review of Psycho-Social Stressors (1), Review or order clinical lab tests (1), Review and summation of old records (2), Review of Last Therapy Session (1), Review of Medication Regimen & Side Effects (2)  and Review of New Medication or Change in Dosage (2)  Assessment: Axis I: Maj. depressive disorder, recurrent severe, posttraumatic stress disorder, cognitive disorder NOS  Axis II: Deferred  Axis III:  Past Medical History  Diagnosis Date  . High cholesterol   . Depression   . Anxiety   . GERD (gastroesophageal reflux disease)   . Insomnia   . Arthritis   . Memory loss    Plan:   I had a long discussion with the patient and his sister about her polypharmacy.  I reviewed emergency room visits, recent blood work, current medication and psychosocial stressors.  She is taking multiple medication.  She does not believe trazodone and temazepam is helping her insomnia.  I would discontinue trazodone and temazepam.  She like to increase her Zyprexa and Remeron.  I do believe that she should see her neurologist or discuss sleep study results and also see therapist in this office for counseling.  I would increase Zyprexa 20 mg at bedtime and Remeron 30 mg at bedtime.  She will continue Klonopin 0.5 mg at bedtime.  The patient does not show any improvement with increase Zyprexa we will consider switching to Thorazine which I discussed with the patient and her sister briefly.  I will see her again in 3-4 weeks.  Recommended to call us back if she has any question, concern or if she feels worsening of the symptom.  Time spent 25 minutes.  More than 50% of the time spent in psychoeducation, counseling and coordination of care.  Discuss safety plan that anytime having active suicidal thoughts or homicidal thoughts then patient need to call 911 or go to the local emergency room.   Ruby Dilone T., MD 02/01/2015

## 2015-02-03 ENCOUNTER — Telehealth (HOSPITAL_COMMUNITY): Payer: Self-pay | Admitting: *Deleted

## 2015-02-03 NOTE — Telephone Encounter (Signed)
I returned patient's phone call.  I spoke to her sister.  I explained that she patient need to give more time to the new medication to work.  Patient is scheduled to see neurologist tomorrow.  I also encourage her sister to go with the patient tomorrow and talk about patient's memory impairment with a neurologist.  Patient's sister promised that she will talk to the patient about her insomnia that she need to give more time to the medication.

## 2015-02-03 NOTE — Telephone Encounter (Signed)
Dr. Adele Schilder,   Patient called nurse's line, requesting call back from you.  Patient stated that the medication given to her for sleep is not working and she has not slept.  Patient stated that her mother is very ill, about to pass and that is interfering with her being able to sleep as well.  Patient said she wanted to talk to you, not anymore nurses.   Thank you

## 2015-02-04 ENCOUNTER — Encounter: Payer: Self-pay | Admitting: Neurology

## 2015-02-04 ENCOUNTER — Ambulatory Visit (INDEPENDENT_AMBULATORY_CARE_PROVIDER_SITE_OTHER): Payer: Medicare Other | Admitting: Neurology

## 2015-02-04 VITALS — BP 122/76 | HR 83 | Resp 16 | Ht 63.0 in | Wt 150.0 lb

## 2015-02-04 DIAGNOSIS — F328 Other depressive episodes: Secondary | ICD-10-CM

## 2015-02-04 DIAGNOSIS — G4700 Insomnia, unspecified: Secondary | ICD-10-CM

## 2015-02-04 DIAGNOSIS — F5105 Insomnia due to other mental disorder: Secondary | ICD-10-CM | POA: Insufficient documentation

## 2015-02-04 DIAGNOSIS — F329 Major depressive disorder, single episode, unspecified: Secondary | ICD-10-CM

## 2015-02-04 DIAGNOSIS — R6889 Other general symptoms and signs: Secondary | ICD-10-CM

## 2015-02-04 DIAGNOSIS — R4589 Other symptoms and signs involving emotional state: Secondary | ICD-10-CM | POA: Insufficient documentation

## 2015-02-04 HISTORY — DX: Other symptoms and signs involving emotional state: R45.89

## 2015-02-04 NOTE — Progress Notes (Signed)
[facility]  SLEEP MEDICINE CLINIC   PATIENT: Kendra Simmons DOB: [birth year]  REFERRING CLINICIAN:  Adele Simmons , MD HISTORY FROM: patient  REASON FOR VISIT: new consult - sleep   HISTORICAL  CHIEF COMPLAINT:  Chief Complaint  Patient presents with  . Sleep Study Results    Rm 11 - Patient's sister is with her     HISTORY OF PRESENT ILLNESS:   NEW HPI 08/27/14: 64 year old female here for evaluation of sleep difficulties.   Previous evaluated for memory and mood issues by Kendra Kendra Simmons.  Her PCP is Kendra. Nancy Simmons and her neurologist is Kendra. Leta Simmons.  The patient was just hospitalized the first week of the new year  2016 for depression in behavior health . Mrs. Kendra Simmons is for the first time seen in the sleep clinic here at [facility] she reports suffering from major depression with episodic more severe spells and was just recently hospitalized. She appears very tired today here in the clinic. Her geriatric depression score was endorsed at 15 out of 15 points her Epworth sleepiness score at 4 points and her fatigue severity score at 49 points. She is referred by her PCP and her psychiatrist for a sleep study pre-evaluation  In her review of systems she endorsed dry eyes insomnia or depression anxiety the feeling of not getting enough sleep the loss of distal of interest in activities that keep she used to be light him and sometimes mental confusion. She carries a diagnosis of depression and anxiety. She states that she is aware that she has sleep apnea and had been tested before but due to claustrophobia could not tolerate the interface that was prescribed to treat sleep apnea. Her family history and social history are listed below. 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 The patient's mother still alive for father died with lung disease at age 1 her  maternal grandmother died of concerned.   The patient's medication was reviewed she is on several medications that would increase her daytime sleepiness namely Desyrel, Zyprexa, Remeron, Klonopin. In addition, she takes vitamin B Prilosec Pravachol Flomax and vitamin D.  She reports that she just doesn't get much sleep. She takes most of her medications between 9 and 10 PM and usually goes to bed at around 10 PM. It may take a still a while to fall asleep if she does at all. She estimates her usual sleep latency to be several hours. Her to sleep time overnight is estimated to be between 3 and 5 hours. She rises to take her Klonopin at 9 AM but her rise time can also very she usually watches TV during the day and she feels that sounds that trigger sleepiness and other people have a reciprocal a paradoxical effect on her. For example rain and went to not make her fall asleep. Her bedroom is quiet and very dark if she stays at her sister's house she asked her sister to even stop her clocks as a ticking may interrupt her sleep.  She seems to be very hypervigilant at night. She worries  All the time, she self reported, and this all her life.  Her last sleep study was over 10 years ago. I explained to the patient that insomnia with hypervigilance may not respond to the treatment of apnea even if found and proportionally treated. However to rule out organic reasons  that affect her sleep on top of any kind of behavior health issues is her in interest at this time.  No history of surgery or trauma to neck , face or airway.   No history of smoking, ETOH - no substances.   02-04-15  Sleep clinic interval history. Mrs. Kendra Simmons underwent a sleep study after I had discussed with Kendra. are feeding her comorbidities and the not so likely yield for finding an organic sleep disorder. For Kendra. Wanda Simmons in the main concern was to evaluate the patient for a misperception of her sleep pattern and to rule out any organic reasons that  could have truly caused insomnia. The sleep study was performed on 1-20 7-16 the patient had a normal heart rate and AHI of 4.5 which is not constant constituting apnea she had few periodic limb movements she had a very fragmented nocturnal sleep and likely due to medication a complete lack of deep sleep but proportionately very high amount of REM sleep. It took her indeed 90 minutes to go to sleep in spite of taking her home medication in the sleep lab. she then slept for about 3-1/2 hours, of which a great portion was REM sleep. Waking up shortly after 3 AM she was unable to reinitiate sleep. Again overall the patient slept for a little over 300 minutes at night and her initial sleep was severely fragmented. By the still constitutes insomnia I could not find an organic reason why she would wake up frequently again neither periodic limb movements or cardiac abnormalities no EEG findings aside from respiratory factors seem to cause her to arousals. She took Klonopin at night she is also on daytime Remeron and Zyprexa. She has meanwhile weaned off Desyrel.  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 She did gain fairly sustained sleep for her. At night all our patients are woken up shortly before 6 AM in this patient was already awake before 4 AM and unable to fall asleep again. I cannot find an organic reason why she had this problem. The patient does have the feeling that she did not sleep at all and that night.and confirmed this again.     Kendra Simmons note :   Past 1-2 months patient has had gradual onset, progressive numbness and tingling in the toes and feet. Symptoms are symmetric. No pain. No burning or pins and needles. She feels a mild annoying numbness sensation. No low back pain. No proximal symptoms. No symptoms in her hands.  UPDATE 01/04/14: Doing well. No new issues. Memory and mood issues are stable. She is concerned  about cost of namenda (> $300 per month) and she cannot afford it. Also, she tells me that her dementia diagnosis is not clear, and that she was told that her memory issues are related to mood and pain issues.   PRIOR HPI (03/20/12, Kendra. Erling Cruz): 64 year old right-handed white single female with a 4-1/2 year history of falls causing left elbow fracture 12/13/06. She is being followed by Kendra. Caryl Comes and has been evaluated with a tilt table. She fell 05/23/10. She does not have syncope with falls. There is no warning. At times she feels sluggish before falling. She denies bowel or bladder incontinence. Workup in the hospital for falls has included CT scans of the brain 12/13/06 and 09/01/07 showing bifrontal atrophy. MRI study of the brain 01/12/10 shows increased hyperintensity on T2 study of the  bilateral petrous apices representing pneumatized bone. The  MRI also shows focal atrophy. She has vitamin B12 deficiency with methylmalonic acid of 207 homocystine 14.7 on 01/18/10. Other studies have shown vitamin D deficiency. RPR nonreactive, B12 254, cortisol 12.2, TSH 1.93, Cholesterol 267,HDL 51, LDL 90, CBC normal, and CMP normal. She denies a family history of dementia. She has a history of depression previously followed by Kendra. Reece Levy but is now going to the  [facility] .She is sleeping much better, feels much better, and has increased appetite. She is not having hallucinations. She is not having headaches, visual loss, double vision, or swallowing problems. She is able to live by herself and  drives a car which her sister says has been safe. The patient states "my head is clear" though I still have problems sleeping at night.08/29/2012= MMSE 25/30. Clock drawing task 4/4. Animal  fluency task 13. Ron Parker Index of independence in activities of daily living 6.  Instrumental  activities of daily living scale 7.    Neuropsychiatric inventory one for hallucinations, 5 for agitation, 6 for depression, 6 for anxiety, 5 for  disinhibition,  5 for irritability, 8 for night time behaviors, and 6 for appetite change. Geriatric depression scale 7/15.  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 CBC, CMP, TSH  were normal except for slightly elevated liver function tests and  TSH 8.27 on 07/17/2011. She is doing well on Namenda. Her only complaint is lower jaw pain from a dry socket at tooth #18. She is concerned about weight gain on Abilify.  REVIEW OF SYSTEMS: Full 14 system review of systems performed : she endorsed dry eyes insomnia or depression anxiety the feeling of not getting enough sleep,  the loss of  interest in activities that keep she used to delight in and sometimes mental confusion.  She carries a diagnosis of depression and anxiety. She states that she is aware that she has sleep apnea and had been tested before but due to claustrophobia could not tolerate the interface that was prescribed to treat sleep apnea.depression anxiety, not enough sleep ,racing thoughts.  epworth 4, FSS 49, patient endorsed no daytime naps, but her sister states she does.  GDS 15 points.    ALLERGIES: No Known Allergies  HOME MEDICATIONS: Outpatient Prescriptions Prior to Visit  Medication Sig Dispense Refill  . acetaminophen (TYLENOL) 500 MG tablet Take 1,000 mg by mouth every 6 (six) hours as needed for mild pain or headache.    . clonazePAM (KLONOPIN) 0.5 MG tablet Take 1 tablet (0.5 mg total) by mouth at bedtime. 30 tablet 0  . cyanocobalamin (,VITAMIN B-12,) 1000 MCG/ML injection Inject 1 mL (1,000 mcg total) into the skin once a week. For bone health 1 mL 0  . mirtazapine (REMERON SOL-TAB) 30 MG disintegrating tablet Take 1 tablet (30 mg total) by mouth at bedtime. For depression/insomnia 30 tablet 0  . olanzapine zydis (ZYPREXA) 20 MG disintegrating tablet Take 1 tablet (20 mg total) by mouth at bedtime. For Insomnia, depression/mood control 30 tablet 0  .  omeprazole (PRILOSEC) 20 MG capsule Take 1 capsule (20 mg total) by mouth daily. For acid reflux    . polyvinyl alcohol (LIQUIFILM TEARS) 1.4 % ophthalmic solution Place 1 drop into both eyes as needed for dry eyes. 15 mL 0  . pravastatin (PRAVACHOL) 20 MG tablet Take 1 tablet (20 mg total) by mouth at bedtime. For high cholesterol    . tamsulosin (FLOMAX) 0.4 MG CAPS capsule Take  1 capsule (0.4 mg total) by mouth daily after supper. For frequent urination/urgency 15 capsule 0  . Vitamin D, Ergocalciferol, (DRISDOL) 50000 UNITS CAPS capsule Take 1 capsule (50,000 Units total) by mouth every 7 (seven) days. On Wednesday: For bone health 30 capsule    No facility-administered medications prior to visit.    PAST MEDICAL HISTORY: Past Medical History  Diagnosis Date  . High cholesterol   . Depression   . Anxiety   . GERD (gastroesophageal reflux disease)   . Insomnia   . Arthritis   . Memory loss     PAST SURGICAL HISTORY: Past Surgical History  Procedure Laterality Date  . Cosmetic surgery    . Cesarean section    . Laproscopy      FAMILY HISTORY: Family History  Problem Relation Age of Onset  . Sleep apnea Father   . Alcohol abuse Father   . Diabetes Mother   . Diabetes Sister   . Diabetes Maternal Uncle   . Diabetes Cousin     SOCIAL HISTORY:  History   Social History  . Marital Status: Divorced    Spouse Name: N/A  . Number of Children: 3  . Years of Education: 14   Occupational History  . Retired    Social History Main Topics  . Smoking status: Never Smoker   . Smokeless tobacco: Never Used  . Alcohol Use: No  . Drug Use: No     Comment: Denies any drug use other than benzos  . Sexual Activity: No   Other Topics Concern  . Not on file   Social History Narrative   Patient lives at home alone.   Caffeine Use:  2 cokes daily   Sister lives next door.     PHYSICAL EXAM  Filed Vitals:   02/04/15 1353  BP: 122/76  Pulse: 83  Resp: 16  Height:  5\' 3"  (1.6 m)  Weight: 150 lb (68.04 kg)    Not recorded      Body mass index is 26.58 kg/(m^2).  General: The patient is awake, alert and appears not in acute distress. The patient is poorly groomed. She lost a lot of weight, lost appetite. 40 pounds in 6 month.   Head: Normocephalic, atraumatic. Neck is supple. Mallampati 4 - uvula not visible.  neck circumference: 13 , retrognathia , severe, clicking at TMJ right more than left. Titubation.  Cardiovascular:  Regular rate and rhythm , without  murmurs or carotid bruit, and without distended neck veins. Respiratory: Lungs are clear to auscultation. Skin:  Without evidence of edema, or rash Trunk: hunched  posture.   Neurologic exam : The patient is awake and alert, oriented to place and time.   There is a normal attention span & concentration ability. Speech is fluent without  Dysarthria, but she has mild  Dysphonia, speaks slow . Mood and affect are depressed.  Cranial nerves: Pupils are equal and briskly reactive to light.  Funduscopic exam without   evidence of pallor or edema. Extraocular movements  in  horizontal planes with coarse saccades , vertical gaze restricted, upwards.  and with nystagmus. Visual fields by finger perimetry are intact. Hearing to finger rub intact.  Facial sensation intact to fine touch. Facial motor strength is symmetric and tongue and uvula move midline.  Motor exam:  elevated  Tone,  and reduced  muscle bulk and symmetric  strength in all extremities. Mild cog wheeling.    Sensory:  Fine touch, pinprick and vibration were  tested in upper  Extremities. She reported  [place] of vibration , touch . In both feet.   Proprioception was affected in lower extremities, intact in upper.   Coordination: Rapid alternating movements in the fingers/hands is tested and normal. Finger-to-nose maneuver tested and normal without evidence of ataxia, dysmetria or tremor.  Gait and station: Patient walks without assistive  device and is able and assisted stool climb up to the exam table. She walks very slow 9 depression ? Shuffling  Gait  ?  Deep tendon reflexes: in the upper and lower extremities are symmetric and intact. Babinski maneuver response is downgoing.   Assessment:  After physical and neurologic examination, review of laboratory studies, imaging, neurophysiology testing and pre-existing records, assessment will be reviewed on the problem list.   Visit duration 45 minutes with  Over 505 of conversation face to face with patient and her sister , information about diagnostic procedures and treatment options of apnea versus UARS. The patient's Parkinsonsim may have to be addressed in the future ( by Kendra. Leta Simmons ). The patient had just been hospitalized for major depession ,her medications were adjusted and her fatigue level peaked. [name]   1)  High fatigue and insomnia : patient with a lifelong history of major depression and sleep affected by this mental disorder.  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 UARS .OSA was diagnosed over 10 years ago, before she lost over 60 pounds.  A new evaluation is necessary.  4) A sleep test  would be the only option for this patient with variable sleep times and anxiety to sleep, she reports feeling more secure in a supervised environment. No morning headches.    Plan:  Treatment plan : refer back to Kendra.  Adele Simmons.   DIAGNOSTIC DATA (LABS, IMAGING, TESTING) - I reviewed patient records, labs, notes, testing and imaging myself where available.  Lab Results  Component Value Date   WBC 6.4 01/22/2015   HGB 13.9 01/22/2015   HCT 42.1 01/22/2015   MCV 90.5 01/22/2015   PLT 140* 01/22/2015      Component Value Date/Time   NA 141 01/22/2015 1413   K 3.5 01/22/2015 1413   CL 105 01/22/2015 1413   CO2  24 01/22/2015 1413   GLUCOSE 88 01/22/2015 1413   BUN 19 01/22/2015 1413   CREATININE 0.67 01/22/2015 1413   CALCIUM 9.3 01/22/2015 1413   PROT 7.0 01/22/2015 1413   ALBUMIN 4.3 01/22/2015 1413   AST 20 01/22/2015 1413   ALT 11 01/22/2015 1413   ALKPHOS 50 01/22/2015 1413   BILITOT 1.0 01/22/2015 1413   GFRNONAA >90 01/22/2015 1413   GFRAA >90 01/22/2015 1413   Lab Results  Component Value Date   CHOL 177 11/04/2014   Lab Results  Component Value Date   HGBA1C 5.8* 11/04/2014   No results found for: AJOINOMV67 Lab Results  Component Value Date   TSH 2.870 10/16/2014       referral to Kendra. Adele Simmons ,   cc Kendra Kendra Simmons for further dementia work up.  This patient is aloof, unable to show emotions, speaking in a low volume poorly modulated voice and shuffles.    Doree Kuehne C. Brittony Billick, MD ABSM ,ABPN  01/26/4708, 6:28 PM Certified in Neurology, Neurophysiology and Sleep medicine   [facility] [address], [place],  [place] [address] [phone]

## 2015-02-08 ENCOUNTER — Emergency Department (HOSPITAL_COMMUNITY)
Admission: EM | Admit: 2015-02-08 | Discharge: 2015-02-08 | Disposition: A | Discharge status: Home / Self Care | Payer: Medicare Other | Attending: Emergency Medicine | Admitting: Emergency Medicine

## 2015-02-08 DIAGNOSIS — K219 Gastro-esophageal reflux disease without esophagitis: Secondary | ICD-10-CM | POA: Insufficient documentation

## 2015-02-08 DIAGNOSIS — Z79899 Other long term (current) drug therapy: Secondary | ICD-10-CM | POA: Diagnosis not present

## 2015-02-08 DIAGNOSIS — F419 Anxiety disorder, unspecified: Secondary | ICD-10-CM | POA: Insufficient documentation

## 2015-02-08 DIAGNOSIS — F329 Major depressive disorder, single episode, unspecified: Secondary | ICD-10-CM | POA: Diagnosis not present

## 2015-02-08 DIAGNOSIS — M1990 Unspecified osteoarthritis, unspecified site: Secondary | ICD-10-CM | POA: Insufficient documentation

## 2015-02-08 DIAGNOSIS — G478 Other sleep disorders: Secondary | ICD-10-CM | POA: Diagnosis not present

## 2015-02-08 DIAGNOSIS — R269 Unspecified abnormalities of gait and mobility: Secondary | ICD-10-CM | POA: Diagnosis not present

## 2015-02-08 DIAGNOSIS — R404 Transient alteration of awareness: Secondary | ICD-10-CM | POA: Diagnosis not present

## 2015-02-08 DIAGNOSIS — R531 Weakness: Secondary | ICD-10-CM | POA: Diagnosis not present

## 2015-02-08 DIAGNOSIS — E860 Dehydration: Secondary | ICD-10-CM | POA: Diagnosis not present

## 2015-02-08 DIAGNOSIS — R42 Dizziness and giddiness: Secondary | ICD-10-CM | POA: Diagnosis not present

## 2015-02-08 DIAGNOSIS — E780 Pure hypercholesterolemia: Secondary | ICD-10-CM | POA: Insufficient documentation

## 2015-02-08 LAB — BASIC METABOLIC PANEL
Anion gap: 5 (ref 5–15)
BUN: 11 mg/dL (ref 6–23)
CHLORIDE: 114 mmol/L — AB (ref 96–112)
CO2: 26 mmol/L (ref 19–32)
Calcium: 9.1 mg/dL (ref 8.4–10.5)
Creatinine, Ser: 0.8 mg/dL (ref 0.50–1.10)
GFR calc non Af Amer: 77 mL/min — ABNORMAL LOW (ref >90)
GFR, EST AFRICAN AMERICAN: 89 mL/min — AB (ref >90)
GLUCOSE: 98 mg/dL (ref 70–99)
POTASSIUM: 4.1 mmol/L (ref 3.5–5.1)
Sodium: 145 mmol/L (ref 135–145)

## 2015-02-08 LAB — CBC
HEMATOCRIT: 39.7 % (ref 36.0–46.0)
Hemoglobin: 13.1 g/dL (ref 12.0–15.0)
MCH: 30.4 pg (ref 26.0–34.0)
MCHC: 33 g/dL (ref 30.0–36.0)
MCV: 92.1 fL (ref 78.0–100.0)
Platelets: 124 10*3/uL — ABNORMAL LOW (ref 150–400)
RBC: 4.31 MIL/uL (ref 3.87–5.11)
RDW: 13 % (ref 11.5–15.5)
WBC: 4.8 10*3/uL (ref 4.0–10.5)

## 2015-02-08 LAB — URINALYSIS, ROUTINE W REFLEX MICROSCOPIC
Glucose, UA: NEGATIVE mg/dL
Hgb urine dipstick: NEGATIVE
Ketones, ur: NEGATIVE mg/dL
NITRITE: NEGATIVE
PH: 6 (ref 5.0–8.0)
PROTEIN: NEGATIVE mg/dL
Specific Gravity, Urine: 1.023 (ref 1.005–1.030)
Urobilinogen, UA: 0.2 mg/dL (ref 0.0–1.0)

## 2015-02-08 LAB — URINE MICROSCOPIC-ADD ON

## 2015-02-08 MED ORDER — SODIUM CHLORIDE 0.9 % IV BOLUS (SEPSIS)
1000.0000 mL | Freq: Once | INTRAVENOUS | Status: AC
  Administered 2015-02-08: 1000 mL via INTRAVENOUS

## 2015-02-08 MED ORDER — SODIUM CHLORIDE 0.9 % IV BOLUS (SEPSIS): 1000.0000 mL | Freq: Once | INTRAVENOUS | Status: DC

## 2015-02-08 NOTE — ED Notes (Signed)
Per EMS, pt from home.  Pt c/o dizziness.  Pt states she has had 2 cokes to drink today.  Has had dx before for same of dehydration. Pt hr 118 on arrival by EMS, 1 liter fluid given in route.  IV - 20g Left hand.  Ending hr 84. Hx insomnia, anxiety, depression, reflux, urinary retention.   Vitals cbg 118, 116/70, resp 16, 98% ra

## 2015-02-08 NOTE — ED Notes (Addendum)
Patient states that she is 'protesting' the last 200cc of her IV saline because of how much it is making her go to the bathroom. Nurse turned fluids off. Patient ambulates well to bathroom w/o assistance. PA notified.

## 2015-02-08 NOTE — Discharge Instructions (Signed)
Please read and follow all provided instructions.  Your diagnoses today include:  1. Generalized weakness   2. Dehydration     Tests performed today include:  Blood counts and electrolytes  Urine test  Vital signs. See below for your results today.   Medications prescribed:   None  Take any prescribed medications only as directed.  Home care instructions:  Follow any educational materials contained in this packet.  BE VERY CAREFUL not to take multiple medicines containing Tylenol (also called acetaminophen). Doing so can lead to an overdose which can damage your liver and cause liver failure and possibly death.   Follow-up instructions: Please follow-up with your primary care provider in the next 3 days for further evaluation of your symptoms.   Return instructions:   Please return to the Emergency Department if you experience worsening symptoms.  Return if you have weakness in your arms or legs, slurred speech, trouble walking or talking, confusion, or trouble with your balance.   Please return if you have any other emergent concerns.  Additional Information:  Your vital signs today were: BP 129/64 mmHg   Pulse 67   Resp 16   SpO2 95% If your blood pressure (BP) was elevated above 135/85 this visit, please have this repeated by your doctor within one month. --------------

## 2015-02-08 NOTE — ED Notes (Signed)
Bed: FU07 Expected date:  Expected time:  Means of arrival:  Comments: Ems- dizziness

## 2015-02-08 NOTE — ED Provider Notes (Signed)
CSN: [id number]     Arrival date & time 02/08/15  1744 History   First MD Initiated Contact with Patient 02/08/15 1837     Chief Complaint  Patient presents with  . Dizziness     (Consider location/radiation/quality/duration/timing/severity/associated sxs/prior Treatment) HPI Comments: Patient with multiple previous ED visits over the past several weeks presents with complaint of "staggering at home" for 2 days along with lightheadedness with standing. She states that she is continuing to have trouble sleeping as well as anxiety and depression. She is able to walk and has not fallen. She has not passed out. Patient denies signs of stroke including: facial droop, slurred speech, aphasia, focal weakness/numbness in extremities. She has not been eating and drinking well. She states that her sister typically buys groceries but has not done so recently. She denies fever, URI symptoms, chest pain, cough, shortness of breath, abdominal pain, nausea, vomiting, diarrhea. No dysuria or hematuria. She is under a lot of stress as her mother is currently in hospice. The course is constant. Aggravating factors: none. Alleviating factors: none.     Patient is a 64 y.o. female presenting with dizziness. The history is provided by the patient and medical records.  Dizziness Associated symptoms: no chest pain, no diarrhea, no headaches, no nausea and no vomiting     Past Medical History  Diagnosis Date  . High cholesterol   . Depression   . Anxiety   . GERD (gastroesophageal reflux disease)   . Insomnia   . Arthritis   . Memory loss   . Emotional depression 02/04/2015   Past Surgical History  Procedure Laterality Date  . Cosmetic surgery    . Cesarean section    . Laproscopy     Family History  Problem Relation Age of Onset  . Sleep apnea Father   . Alcohol abuse Father   . Diabetes Mother   . Diabetes Sister   . Diabetes Maternal Uncle   . Diabetes Cousin    History  Substance Use Topics   . Smoking status: Never Smoker   . Smokeless tobacco: Never Used  . Alcohol Use: No   OB History    No data available     Review of Systems  Constitutional: Negative for fever.  HENT: Negative for rhinorrhea and sore throat.   Eyes: Negative for redness.  Respiratory: Negative for cough.   Cardiovascular: Negative for chest pain.  Gastrointestinal: Negative for nausea, vomiting, abdominal pain and diarrhea.  Genitourinary: Negative for dysuria.  Musculoskeletal: Positive for gait problem. Negative for myalgias.  Skin: Negative for rash.  Neurological: Positive for dizziness. Negative for headaches.  Psychiatric/Behavioral: Positive for sleep disturbance and dysphoric mood.   Allergies  Review of patient's allergies indicates no known allergies.  Home Medications   Prior to Admission medications   Medication Sig Start Date End Date Taking? Authorizing Provider  acetaminophen (TYLENOL) 500 MG tablet Take 1,000 mg by mouth every 6 (six) hours as needed for mild pain or headache.   Yes Historical Provider, MD  clonazePAM (KLONOPIN) 0.5 MG tablet Take 1 tablet (0.5 mg total) by mouth at bedtime. 02/01/15  Yes [name], MD  cyanocobalamin (,VITAMIN B-12,) 1000 MCG/ML injection Inject 1 mL (1,000 mcg total) into the skin once a week. For bone health 12/20/14  Yes Encarnacion Slates, NP  mirtazapine (REMERON SOL-TAB) 30 MG disintegrating tablet Take 1 tablet (30 mg total) by mouth at bedtime. For depression/insomnia 02/01/15  Yes [name], MD  olanzapine zydis (ZYPREXA) 20 MG disintegrating tablet Take 1 tablet (20 mg total) by mouth at bedtime. For Insomnia, depression/mood control 02/01/15  Yes [name], MD  omeprazole (PRILOSEC) 20 MG capsule Take 1 capsule (20 mg total) by mouth daily. For acid reflux 12/20/14  Yes Encarnacion Slates, NP  polyvinyl alcohol (LIQUIFILM TEARS) 1.4 % ophthalmic solution Place 1 drop into both eyes as needed for dry eyes. 12/20/14  Yes Encarnacion Slates, NP   pravastatin (PRAVACHOL) 20 MG tablet Take 1 tablet (20 mg total) by mouth at bedtime. For high cholesterol 12/20/14  Yes Encarnacion Slates, NP  tamsulosin (FLOMAX) 0.4 MG CAPS capsule Take 1 capsule (0.4 mg total) by mouth daily after supper. For frequent urination/urgency 12/20/14  Yes Encarnacion Slates, NP  Vitamin D, Ergocalciferol, (DRISDOL) 50000 UNITS CAPS capsule Take 1 capsule (50,000 Units total) by mouth every 7 (seven) days. On Wednesday: For bone health 12/20/14  Yes Encarnacion Slates, NP   BP 114/80 mmHg  Pulse 72  Resp 16  SpO2 96%   Physical Exam  Constitutional: She is oriented to person, place, and time. She appears well-developed and well-nourished.  HENT:  Head: Normocephalic and atraumatic.  Right Ear: Tympanic membrane, external ear and ear canal normal.  Left Ear: Tympanic membrane, external ear and ear canal normal.  Nose: Nose normal.  Mouth/Throat: Uvula is midline, oropharynx is clear and moist and mucous membranes are normal.  Eyes: Conjunctivae, EOM and lids are normal. Pupils are equal, round, and reactive to light. Right eye exhibits no discharge. Left eye exhibits no discharge. Right eye exhibits no nystagmus. Left eye exhibits no nystagmus.  Neck: Normal range of motion. Neck supple.  Cardiovascular: Normal rate, regular rhythm and normal heart sounds.   Pulmonary/Chest: Effort normal and breath sounds normal.  Abdominal: Soft. There is no tenderness.  Musculoskeletal:       Cervical back: She exhibits normal range of motion, no tenderness and no bony tenderness.  Neurological: She is alert and oriented to person, place, and time. She has normal strength and normal reflexes. No cranial nerve deficit or sensory deficit. She displays a negative Romberg sign. Coordination and gait normal. GCS eye subscore is 4. GCS verbal subscore is 5. GCS motor subscore is 6.  Skin: Skin is warm and dry.  Psychiatric: Her affect is blunt.  Nursing note and vitals reviewed.   ED Course   Procedures (including critical care time) Labs Review Labs Reviewed  CBC - Abnormal; Notable for the following:    Platelets 124 (*)    All other components within normal limits  BASIC METABOLIC PANEL - Abnormal; Notable for the following:    Chloride 114 (*)    GFR calc non Af Amer 77 (*)    GFR calc Af Amer 89 (*)    All other components within normal limits  URINALYSIS, ROUTINE W REFLEX MICROSCOPIC - Abnormal; Notable for the following:    Color, Urine AMBER (*)    APPearance CLOUDY (*)    Bilirubin Urine SMALL (*)    Leukocytes, UA SMALL (*)    All other components within normal limits  URINE MICROSCOPIC-ADD ON - Abnormal; Notable for the following:    Bacteria, UA FEW (*)    Crystals CA OXALATE CRYSTALS (*)    All other components within normal limits    Imaging Review No results found.   EKG Interpretation None       7:49 PM Patient seen and examined.  Work-up initiated.   Vital signs reviewed and are as follows: BP 114/80 mmHg  Pulse 72  Resp 16  SpO2 96%  10:03 PM Work-up is reassuring. Patient has been up to the bathroom several times without problem. She was given additional fluids 2/2 elevated HR with standing and she refused part of the 2nd bolus.   Will d/c to home.   Encouraged PCP f/u in the next several days for recheck. Encouraged good oral and fluid intake.   Patient urged to return with worsening symptoms or other concerns. Patient verbalized understanding and agrees with plan.  Patient counseled to return if they have weakness in their arms or legs, slurred speech, trouble walking or talking, confusion, trouble with their balance, or if they have any other concerns. Patient verbalizes understanding and agrees with plan.    MDM   Final diagnoses:  Generalized weakness  Dehydration   Patient with generalized weakness and likely mild dehydration. Her main complaint is staggering gait however her gait is normal here without any other focal  neuro deficits. I do not suspect stroke or TIA. Patient appears clinically depressed. I feel that she probably is not eating and drinking very well at home. Patient was given fluids in the emergency department. She was mildly orthostatic. Vitals remained within normal limits. Feel pt safe for d/c to home. Encouraged PCP f/u.      Carlisle Cater, PA-C 02/08/15 2209  [name], MD 02/09/15 [phone]

## 2015-02-10 ENCOUNTER — Telehealth (HOSPITAL_COMMUNITY): Payer: Self-pay

## 2015-02-10 NOTE — Telephone Encounter (Signed)
I returned phone call.  Her voicemail is not set up so cannot leave a message.

## 2015-02-12 ENCOUNTER — Other Ambulatory Visit (HOSPITAL_COMMUNITY): Payer: Self-pay | Admitting: Psychiatry

## 2015-02-15 NOTE — Telephone Encounter (Signed)
Patient is requesting a refill of Zoloft.  Last seen 02/01/15 and returns 02/22/15 with no new orders authorized at last evaluation.

## 2015-02-16 ENCOUNTER — Telehealth (HOSPITAL_COMMUNITY): Payer: Self-pay | Admitting: *Deleted

## 2015-02-16 ENCOUNTER — Telehealth (HOSPITAL_COMMUNITY): Payer: Self-pay

## 2015-02-16 ENCOUNTER — Other Ambulatory Visit (HOSPITAL_COMMUNITY): Payer: Self-pay | Admitting: Psychiatry

## 2015-02-16 DIAGNOSIS — G4700 Insomnia, unspecified: Secondary | ICD-10-CM

## 2015-02-16 MED ORDER — SUVOREXANT 5 MG PO TABS: 5.0000 mg | ORAL_TABLET | Freq: Every day | ORAL | Status: DC

## 2015-02-16 NOTE — Telephone Encounter (Signed)
I returned patient's phone call.  She continued to insist that she has not slept very well.  She is asking for a new prescription Belsorma to try.  She recently visited her neurologist and discussed sleep study results.  She was disappointed because it was inconclusive.  She mentioned that since the last visit she has not slept as good.  She reported she is sleeping 3 hours.  I had a long discussion about sleep perception but patient insists that she wanted to try the new medication.  Discussed medication side effects and benefits.  Recommended to call us back if her symptoms get worse.  Patient has appointment on March 8.

## 2015-02-16 NOTE — Telephone Encounter (Signed)
Patient left message on nurse's voice mail that she needs another prescription for Belsomra, cannot get the package.  I tried to call the patient back but the number we have listed cannot leave a message.  I called [facility] and spoke with Cristie Hem.  Alex stated that what they told the patient was Belsomra was not in stock for the 5 mg or 10 mg but he can order it for her but may not be ready til Friday. Alex stated patient became agitated took the prescription for Belsomra and left. Patient still has script per Cristie Hem at [facility].  Attempted to call patient, no answer, cannot leave a message.(message stated you reached the [employer] customer, voice mail has not been activated)

## 2015-02-16 NOTE — Telephone Encounter (Signed)
Zoloft has been discontinued and should not be given.  Refill is inappropriate.

## 2015-02-16 NOTE — Telephone Encounter (Signed)
Patient picked up prescription on 02/16/15.  Did not have drivers license.  Niece was with her, license # [id number]  dlo

## 2015-02-17 ENCOUNTER — Telehealth (HOSPITAL_COMMUNITY): Payer: Self-pay

## 2015-02-17 ENCOUNTER — Telehealth (HOSPITAL_COMMUNITY): Payer: Self-pay | Admitting: *Deleted

## 2015-02-17 ENCOUNTER — Ambulatory Visit (HOSPITAL_COMMUNITY): Payer: Self-pay | Admitting: Clinical

## 2015-02-17 NOTE — Telephone Encounter (Signed)
Attempted to reach patient's sister at phone number provided two times today and no answer.  No message left due to no identifying verification it was Kendra Simmons I would be leaving message so will continue attempts to verify Kendra Simmons agrees to manage patient's Belsomra if Dr. Adele Schilder writes it for 30 days.

## 2015-02-17 NOTE — Telephone Encounter (Signed)
Medication refill - states needs Belsomra written for 30 days and not 15 pills due to insurance coverage.

## 2015-02-17 NOTE — Telephone Encounter (Signed)
Neale Burly called requesting the Belsomra be sent as a 30 day supply.  I advised Annistyn of you and Dr. Marguerite Olea discussion and that her sister will need to contact you because a 30 day RX will only be given with her sister being in control of dispensing the medication, for her([place]) safety.  Cornella verbalized understanding. Viann stated she will have her sister contact you.  [name], Deyna sister called, requesting call back from you.  [name] stated that she is now in control of [place]'s medication and will have no problem with dispensing the new medication.  Sister request call back.

## 2015-02-17 NOTE — Telephone Encounter (Signed)
Discussed with Dr. Adele Schilder patient's request for a 30 day supply of Belsomra and continued complaint of not sleeping and that her Mother had died the previous day.  Dr. Adele Schilder discussed concern for giving patient 30 days supply at a time and will order if patient's sister will become involved and help manage and keep medications with her and not with patient.  Attempted to reach patient's sister but no answer with one phone number and no identifying information on voicemail of the other so could not leave a message.  Attempted to contact Ms. Hollinsworth back to inform of Dr. Marguerite Olea concerns and request for her sister to manage po medications.  Patient did not answer and voicemail was full. Will continue attempts to reach patient.

## 2015-02-18 ENCOUNTER — Telehealth (HOSPITAL_COMMUNITY): Payer: Self-pay

## 2015-02-18 ENCOUNTER — Other Ambulatory Visit (HOSPITAL_COMMUNITY): Payer: Self-pay

## 2015-02-18 DIAGNOSIS — F5105 Insomnia due to other mental disorder: Secondary | ICD-10-CM

## 2015-02-18 DIAGNOSIS — G4700 Insomnia, unspecified: Principal | ICD-10-CM

## 2015-02-18 MED ORDER — SUVOREXANT 5 MG PO TABS: 5.0000 mg | ORAL_TABLET | Freq: Every day | ORAL | Status: DC

## 2015-02-18 NOTE — Telephone Encounter (Signed)
Met with Dr. Adele Schilder to report patient's sister, Ms Nils Pyle stated she is assisting patient with all her medications and keeps these for patient.  Ms. Nils Pyle stated she would manage the Belsomra order if given a 30 day supply and not allow patient to have access to multiple days to maintain her safety.  New order printed and Ms. Nils Pyle will pick up and assist patient with managing.  Patient to return 02/22/15 for next evaluation.

## 2015-02-18 NOTE — Telephone Encounter (Signed)
Kendra Simmons, sister picked up prescription on 02/18/15  [place] [phone]  dlo

## 2015-02-22 ENCOUNTER — Encounter (HOSPITAL_COMMUNITY): Payer: Self-pay | Admitting: Psychiatry

## 2015-02-22 ENCOUNTER — Ambulatory Visit (INDEPENDENT_AMBULATORY_CARE_PROVIDER_SITE_OTHER): Payer: Medicare Other | Admitting: Psychiatry

## 2015-02-22 VITALS — BP 124/76 | HR 92 | Ht 63.0 in | Wt 146.0 lb

## 2015-02-22 DIAGNOSIS — F4310 Post-traumatic stress disorder, unspecified: Secondary | ICD-10-CM | POA: Diagnosis not present

## 2015-02-22 DIAGNOSIS — G4700 Insomnia, unspecified: Secondary | ICD-10-CM

## 2015-02-22 DIAGNOSIS — F09 Unspecified mental disorder due to known physiological condition: Secondary | ICD-10-CM

## 2015-02-22 DIAGNOSIS — F332 Major depressive disorder, recurrent severe without psychotic features: Secondary | ICD-10-CM

## 2015-02-22 DIAGNOSIS — F5105 Insomnia due to other mental disorder: Secondary | ICD-10-CM

## 2015-02-22 DIAGNOSIS — F331 Major depressive disorder, recurrent, moderate: Secondary | ICD-10-CM

## 2015-02-22 MED ORDER — SUVOREXANT 10 MG PO TABS: 10.0000 mg | ORAL_TABLET | Freq: Every day | ORAL | Status: DC

## 2015-02-22 MED ORDER — OLANZAPINE 20 MG PO TBDP: 20.0000 mg | ORAL_TABLET | Freq: Every day | ORAL | Status: DC

## 2015-02-22 MED ORDER — MIRTAZAPINE 30 MG PO TBDP: 30.0000 mg | ORAL_TABLET | Freq: Every day | ORAL | Status: DC

## 2015-02-22 MED ORDER — CLONAZEPAM 0.5 MG PO TABS: 0.5000 mg | ORAL_TABLET | Freq: Every day | ORAL | Status: DC

## 2015-02-23 NOTE — Progress Notes (Signed)
Kendra Simmons Progress Note   Kendra Simmons [id number] 63 y.o.  02/23/2015 9:18 AM  Chief Complaint:  I like new medication but I'm only sleeping 3-4 hours.         History of Present Illness:  Kendra Simmons came for her follow-up appointment with her sister.  She has been calling us very frequently requesting medication for insomnia.  She was started on Balsamra 5 mg and she felt some improvement as she able to sleep 4-5 hours.  Recently she was very sad and depressed because her mother died last week.  She also visited emergency room on February 23 because she was dehydrated , exhausted and fell.  Her sister endorse that she's not eating as good and does not care about her diet and her ADLs.  Her sister decided to have her niece to stay at her place who will monitor her diet and eating habits.  Since she is eating better she denies any dizziness or any tired feeling.  She likes Zyprexa and Remeron but she admitted sometimes she has poor appetite and does not have the desire to eat anything.  She saw her neurologist and discuss her sleep studies and she was disappointed that there were no real cause for insomnia.  She also endorse some time having memory impairment and she scheduled a follow-up appointment or discuss her cognitive impairment.  Overall patient denies any irritability, anger, mood swing, crying spells or any feeling of hopelessness or worthlessness.  However she still complaining of insomnia and feeling tired due to lack of sleep.  She is wondering if her new sleep medication can be further increase.  In the past we had tried numerous medication including benzodiazepine to help her sleep but she did not see any improvement.  She is not interested in counseling.  Patient denies any suicidal thoughts or homicidal thought.  Suicidal Ideation: No Plan Formed: No Patient has means to carry out plan: No  Homicidal Ideation: No Plan Formed: No Patient has means to carry out  plan: No  Past Psychiatric History/Hospitalization(s) Patient has multiple hospitalization. Her last hospitalization was January 2016 .  She has done one intensive outpatient program.  She has a history of hallucination, paranoia, severe depression. In the past she has seen [name], [name] and [name].  As per chart she has taken in the past doxepin, Xanax, Seroquel, amitriptyline, imipramine, temazepam , trazodone , Wellbutrin, Prozac and Vistaril.     Anxiety: Yes Bipolar Disorder: No Depression: Yes Mania: No Psychosis: History of hallucinations Schizophrenia: No Personality Disorder: No Hospitalization for psychiatric illness: Yes History of Electroconvulsive Shock Therapy: No Prior Suicide Attempts: No  Medical History; Patient has high cholesterol, memory impairment, GERD and palpitation.  She has history of frequent falls and seen by a neurologist in the past.  Her primary care physician is [name] in [place].  Review of Systems  Constitutional: Negative.   Skin: Negative for itching and rash.  Psychiatric/Behavioral: Positive for depression. The patient is nervous/anxious.    Psychiatric: Agitation: No Hallucination: No Depressed Mood: No Insomnia: Yes Hypersomnia: No Altered Concentration: No Feels Worthless: No Grandiose Ideas: No Belief In Special Powers: No New/Increased Substance Abuse: No Compulsions: No  Neurologic: Headache: No Seizure: No Paresthesias: Yes   Musculoskeletal: Strength & Muscle Tone: within normal limits Gait & Station: normal Patient leans: N/A   Outpatient Encounter Prescriptions as of 02/22/2015  Medication Sig  . acetaminophen (TYLENOL) 500 MG tablet Take  1,000 mg by mouth every 6 (six) hours as needed for mild pain or headache.  . clonazePAM (KLONOPIN) 0.5 MG tablet Take 1 tablet (0.5 mg total) by mouth at bedtime.  . cyanocobalamin (,VITAMIN B-12,) 1000 MCG/ML injection Inject 1 mL (1,000 mcg total) into  the skin once a week. For bone health  . mirtazapine (REMERON SOL-TAB) 30 MG disintegrating tablet Take 1 tablet (30 mg total) by mouth at bedtime. For depression/insomnia  . OLANZapine zydis (ZYPREXA) 20 MG disintegrating tablet Take 1 tablet (20 mg total) by mouth at bedtime. For Insomnia, depression/mood control  . omeprazole (PRILOSEC) 20 MG capsule Take 1 capsule (20 mg total) by mouth daily. For acid reflux  . polyvinyl alcohol (LIQUIFILM TEARS) 1.4 % ophthalmic solution Place 1 drop into both eyes as needed for dry eyes.  . pravastatin (PRAVACHOL) 20 MG tablet Take 1 tablet (20 mg total) by mouth at bedtime. For high cholesterol  . Suvorexant (BELSOMRA) 10 MG TABS Take 10 mg by mouth at bedtime.  . tamsulosin (FLOMAX) 0.4 MG CAPS capsule Take 1 capsule (0.4 mg total) by mouth daily after supper. For frequent urination/urgency  . Vitamin D, Ergocalciferol, (DRISDOL) 50000 UNITS CAPS capsule Take 1 capsule (50,000 Units total) by mouth every 7 (seven) days. On Wednesday: For bone health  . [DISCONTINUED] clonazePAM (KLONOPIN) 0.5 MG tablet Take 1 tablet (0.5 mg total) by mouth at bedtime.  . [DISCONTINUED] mirtazapine (REMERON SOL-TAB) 30 MG disintegrating tablet Take 1 tablet (30 mg total) by mouth at bedtime. For depression/insomnia  . [DISCONTINUED] olanzapine zydis (ZYPREXA) 20 MG disintegrating tablet Take 1 tablet (20 mg total) by mouth at bedtime. For Insomnia, depression/mood control  . [DISCONTINUED] Suvorexant (BELSOMRA) 5 MG TABS Take 5 mg by mouth at bedtime.    Recent Results (from the past 2160 hour(s))  Urinalysis, Routine w reflex microscopic     Status: Abnormal   Collection Time: 12/13/14  5:36 PM  Result Value Ref Range   Color, Urine AMBER (A) YELLOW    Comment: BIOCHEMICALS MAY BE AFFECTED BY COLOR   APPearance CLOUDY (A) CLEAR   Specific Gravity, Urine 1.028 1.005 - 1.030   pH 5.5 5.0 - 8.0   Glucose, UA NEGATIVE NEGATIVE mg/dL   Hgb urine dipstick NEGATIVE  NEGATIVE   Bilirubin Urine SMALL (A) NEGATIVE   Ketones, ur NEGATIVE NEGATIVE mg/dL   Protein, ur NEGATIVE NEGATIVE mg/dL   Urobilinogen, UA 1.0 0.0 - 1.0 mg/dL   Nitrite NEGATIVE NEGATIVE   Leukocytes, UA SMALL (A) NEGATIVE  Drug screen panel, emergency     Status: Abnormal   Collection Time: 12/13/14  5:36 PM  Result Value Ref Range   Opiates NONE DETECTED NONE DETECTED   Cocaine NONE DETECTED NONE DETECTED   Benzodiazepines POSITIVE (A) NONE DETECTED   Amphetamines NONE DETECTED NONE DETECTED   Tetrahydrocannabinol NONE DETECTED NONE DETECTED   Barbiturates NONE DETECTED NONE DETECTED    Comment:        DRUG SCREEN FOR MEDICAL PURPOSES ONLY.  IF CONFIRMATION IS NEEDED FOR ANY PURPOSE, NOTIFY LAB WITHIN 5 DAYS.        LOWEST DETECTABLE LIMITS FOR URINE DRUG SCREEN Drug Class       Cutoff (ng/mL) Amphetamine      1000 Barbiturate      200 Benzodiazepine   295 Tricyclics       188 Opiates          300 Cocaine  300 THC              50   Urine microscopic-add on     Status: Abnormal   Collection Time: 12/13/14  5:36 PM  Result Value Ref Range   Squamous Epithelial / LPF RARE RARE   WBC, UA 3-6 <3 WBC/hpf   Bacteria, UA RARE RARE   Crystals CA OXALATE CRYSTALS (A) NEGATIVE   Urine-Other MUCOUS PRESENT   CBC     Status: Abnormal   Collection Time: 12/13/14  5:52 PM  Result Value Ref Range   WBC 5.6 4.0 - 10.5 K/uL   RBC 4.71 3.87 - 5.11 MIL/uL   Hemoglobin 14.0 12.0 - 15.0 g/dL   HCT 42.0 36.0 - 46.0 %   MCV 89.2 78.0 - 100.0 fL   MCH 29.7 26.0 - 34.0 pg   MCHC 33.3 30.0 - 36.0 g/dL   RDW 13.2 11.5 - 15.5 %   Platelets 142 (L) 150 - 400 K/uL  Comprehensive metabolic panel     Status: Abnormal   Collection Time: 12/13/14  5:52 PM  Result Value Ref Range   Sodium 140 135 - 145 mmol/L    Comment: Please note change in reference range.   Potassium 3.6 3.5 - 5.1 mmol/L    Comment: Please note change in reference range.   Chloride 110 96 - 112 mEq/L   CO2 22  19 - 32 mmol/L   Glucose, Bld 150 (H) 70 - 99 mg/dL   BUN 16 6 - 23 mg/dL   Creatinine, Ser 0.57 0.50 - 1.10 mg/dL   Calcium 9.1 8.4 - 10.5 mg/dL   Total Protein 7.2 6.0 - 8.3 g/dL   Albumin 4.5 3.5 - 5.2 g/dL   AST 24 0 - 37 U/L   ALT 12 0 - 35 U/L   Alkaline Phosphatase 67 39 - 117 U/L   Total Bilirubin 0.6 0.3 - 1.2 mg/dL   GFR calc non Af Amer >90 >90 mL/min   GFR calc Af Amer >90 >90 mL/min    Comment: (NOTE) The eGFR has been calculated using the CKD EPI equation. This calculation has not been validated in all clinical situations. eGFR's persistently <90 mL/min signify possible Chronic Kidney Disease.    Anion gap 8 5 - 15  Salicylate level     Status: None   Collection Time: 12/13/14  5:52 PM  Result Value Ref Range   Salicylate Lvl <4.8 2.8 - 20.0 mg/dL  Acetaminophen level     Status: Abnormal   Collection Time: 12/13/14  5:52 PM  Result Value Ref Range   Acetaminophen (Tylenol), Serum <10.0 (L) 10 - 30 ug/mL    Comment:        THERAPEUTIC CONCENTRATIONS VARY SIGNIFICANTLY. A RANGE OF 10-30 ug/mL MAY BE AN EFFECTIVE CONCENTRATION FOR MANY PATIENTS. HOWEVER, SOME ARE BEST TREATED AT CONCENTRATIONS OUTSIDE THIS RANGE. ACETAMINOPHEN CONCENTRATIONS >150 ug/mL AT 4 HOURS AFTER INGESTION AND >50 ug/mL AT 12 HOURS AFTER INGESTION ARE OFTEN ASSOCIATED WITH TOXIC REACTIONS.   Ethanol     Status: None   Collection Time: 12/13/14  5:52 PM  Result Value Ref Range   Alcohol, Ethyl (B) <5 0 - 9 mg/dL    Comment:        LOWEST DETECTABLE LIMIT FOR SERUM ALCOHOL IS 11 mg/dL FOR MEDICAL PURPOSES ONLY   Urinalysis, Routine w reflex microscopic     Status: None   Collection Time: 12/19/14  3:25 PM  Result Value Ref Range  Color, Urine YELLOW YELLOW   APPearance CLEAR CLEAR   Specific Gravity, Urine 1.022 1.005 - 1.030   pH 7.0 5.0 - 8.0   Glucose, UA NEGATIVE NEGATIVE mg/dL   Hgb urine dipstick NEGATIVE NEGATIVE   Bilirubin Urine NEGATIVE NEGATIVE   Ketones, ur  NEGATIVE NEGATIVE mg/dL   Protein, ur NEGATIVE NEGATIVE mg/dL   Urobilinogen, UA 0.2 0.0 - 1.0 mg/dL   Nitrite NEGATIVE NEGATIVE   Leukocytes, UA NEGATIVE NEGATIVE    Comment: MICROSCOPIC NOT DONE ON URINES WITH NEGATIVE PROTEIN, BLOOD, LEUKOCYTES, NITRITE, OR GLUCOSE <1000 mg/dL. Performed at [facility]   CBC with Differential/Platelet     Status: Abnormal   Collection Time: 01/22/15  2:13 PM  Result Value Ref Range   WBC 6.4 4.0 - 10.5 K/uL   RBC 4.65 3.87 - 5.11 MIL/uL   Hemoglobin 13.9 12.0 - 15.0 g/dL   HCT 42.1 36.0 - 46.0 %   MCV 90.5 78.0 - 100.0 fL   MCH 29.9 26.0 - 34.0 pg   MCHC 33.0 30.0 - 36.0 g/dL   RDW 13.1 11.5 - 15.5 %   Platelets 140 (L) 150 - 400 K/uL   Neutrophils Relative % 69 43 - 77 %   Neutro Abs 4.4 1.7 - 7.7 K/uL   Lymphocytes Relative 24 12 - 46 %   Lymphs Abs 1.5 0.7 - 4.0 K/uL   Monocytes Relative 6 3 - 12 %   Monocytes Absolute 0.4 0.1 - 1.0 K/uL   Eosinophils Relative 1 0 - 5 %   Eosinophils Absolute 0.1 0.0 - 0.7 K/uL   Basophils Relative 0 0 - 1 %   Basophils Absolute 0.0 0.0 - 0.1 K/uL  Comprehensive metabolic panel     Status: None   Collection Time: 01/22/15  2:13 PM  Result Value Ref Range   Sodium 141 135 - 145 mmol/L   Potassium 3.5 3.5 - 5.1 mmol/L   Chloride 105 96 - 112 mmol/L   CO2 24 19 - 32 mmol/L   Glucose, Bld 88 70 - 99 mg/dL   BUN 19 6 - 23 mg/dL   Creatinine, Ser 0.67 0.50 - 1.10 mg/dL   Calcium 9.3 8.4 - 10.5 mg/dL   Total Protein 7.0 6.0 - 8.3 g/dL   Albumin 4.3 3.5 - 5.2 g/dL   AST 20 0 - 37 U/L   ALT 11 0 - 35 U/L   Alkaline Phosphatase 50 39 - 117 U/L   Total Bilirubin 1.0 0.3 - 1.2 mg/dL   GFR calc non Af Amer >90 >90 mL/min   GFR calc Af Amer >90 >90 mL/min    Comment: (NOTE) The eGFR has been calculated using the CKD EPI equation. This calculation has not been validated in all clinical situations. eGFR's persistently <90 mL/min signify possible Chronic Kidney Disease.    Anion gap 12 5  - 15  Troponin I     Status: None   Collection Time: 01/22/15  2:13 PM  Result Value Ref Range   Troponin I <0.03 <0.031 ng/mL    Comment:        NO INDICATION OF MYOCARDIAL INJURY.   Magnesium     Status: None   Collection Time: 01/22/15  2:13 PM  Result Value Ref Range   Magnesium 2.1 1.5 - 2.5 mg/dL  Phosphorus     Status: None   Collection Time: 01/22/15  2:13 PM  Result Value Ref Range   Phosphorus 3.5 2.3 - 4.6 mg/dL  Urinalysis, Routine w reflex microscopic     Status: Abnormal   Collection Time: 01/22/15  2:35 PM  Result Value Ref Range   Color, Urine AMBER (A) YELLOW    Comment: BIOCHEMICALS MAY BE AFFECTED BY COLOR   APPearance CLEAR CLEAR   Specific Gravity, Urine 1.031 (H) 1.005 - 1.030   pH 6.0 5.0 - 8.0   Glucose, UA NEGATIVE NEGATIVE mg/dL   Hgb urine dipstick NEGATIVE NEGATIVE   Bilirubin Urine MODERATE (A) NEGATIVE   Ketones, ur >80 (A) NEGATIVE mg/dL   Protein, ur NEGATIVE NEGATIVE mg/dL   Urobilinogen, UA 1.0 0.0 - 1.0 mg/dL   Nitrite NEGATIVE NEGATIVE   Leukocytes, UA SMALL (A) NEGATIVE  Urine microscopic-add on     Status: None   Collection Time: 01/22/15  2:35 PM  Result Value Ref Range   Squamous Epithelial / LPF RARE RARE   WBC, UA 3-6 <3 WBC/hpf   RBC / HPF 0-2 <3 RBC/hpf  CBC  (at [facility])     Status: Abnormal   Collection Time: 02/08/15  6:13 PM  Result Value Ref Range   WBC 4.8 4.0 - 10.5 K/uL   RBC 4.31 3.87 - 5.11 MIL/uL   Hemoglobin 13.1 12.0 - 15.0 g/dL   HCT 39.7 36.0 - 46.0 %   MCV 92.1 78.0 - 100.0 fL   MCH 30.4 26.0 - 34.0 pg   MCHC 33.0 30.0 - 36.0 g/dL   RDW 13.0 11.5 - 15.5 %   Platelets 124 (L) 150 - 400 K/uL  Basic metabolic panel  (at [facility])     Status: Abnormal   Collection Time: 02/08/15  6:13 PM  Result Value Ref Range   Sodium 145 135 - 145 mmol/L   Potassium 4.1 3.5 - 5.1 mmol/L   Chloride 114 (H) 96 - 112 mmol/L   CO2 26 19 - 32 mmol/L   Glucose, Bld 98 70 - 99 mg/dL   BUN 11 6 - 23  mg/dL   Creatinine, Ser 0.80 0.50 - 1.10 mg/dL   Calcium 9.1 8.4 - 10.5 mg/dL   GFR calc non Af Amer 77 (L) >90 mL/min   GFR calc Af Amer 89 (L) >90 mL/min    Comment: (NOTE) The eGFR has been calculated using the CKD EPI equation. This calculation has not been validated in all clinical situations. eGFR's persistently <90 mL/min signify possible Chronic Kidney Disease.    Anion gap 5 5 - 15  Urinalysis, Routine w reflex microscopic     Status: Abnormal   Collection Time: 02/08/15  7:27 PM  Result Value Ref Range   Color, Urine AMBER (A) YELLOW    Comment: BIOCHEMICALS MAY BE AFFECTED BY COLOR   APPearance CLOUDY (A) CLEAR   Specific Gravity, Urine 1.023 1.005 - 1.030   pH 6.0 5.0 - 8.0   Glucose, UA NEGATIVE NEGATIVE mg/dL   Hgb urine dipstick NEGATIVE NEGATIVE   Bilirubin Urine SMALL (A) NEGATIVE   Ketones, ur NEGATIVE NEGATIVE mg/dL   Protein, ur NEGATIVE NEGATIVE mg/dL   Urobilinogen, UA 0.2 0.0 - 1.0 mg/dL   Nitrite NEGATIVE NEGATIVE   Leukocytes, UA SMALL (A) NEGATIVE  Urine microscopic-add on     Status: Abnormal   Collection Time: 02/08/15  7:27 PM  Result Value Ref Range   Squamous Epithelial / LPF RARE RARE   WBC, UA 3-6 <3 WBC/hpf   Bacteria, UA FEW (A) RARE   Crystals CA OXALATE CRYSTALS (A) NEGATIVE  Urine-Other MUCOUS PRESENT       Constitutional:  BP 124/76 mmHg  Pulse 92  Ht _0  (1.6 m)  Wt 146 lb (66.225 kg)  BMI 25.87 kg/m2   Mental Status Examination;  Patient is casually dressed and fairly groomed. She appears tired and fatigued.  Her speech is slow, clear and coherent.  Her thought process slow and logical.  She described her mood tired and tired.  Her affect is constricted.  Her psychomotor activity is slightly decreased.  She denies any active or passive suicidal thoughts or homicidal thought.  There were no delusions, paranoia or any obsessive thoughts.  Her attention and concentration is fair.  Her fund of knowledge is average.  She has  difficulty remembering things.  She has no tremors or shakes.  She is alert and oriented 3.  Her insight, judgment and impulse control is fair.   Review of Psycho-Social Stressors (1), Review or order clinical lab tests (1), Review and summation of old records (2), Review of Last Therapy Session (1), Review of Medication Regimen & Side Effects (2) and Review of New Medication or Change in Dosage (2)  Assessment: Axis I: Maj. depressive disorder, recurrent severe, posttraumatic stress disorder, cognitive disorder NOS  Axis II: Deferred  Axis III:  Past Medical History  Diagnosis Date  . High cholesterol   . Depression   . Anxiety   . GERD (gastroesophageal reflux disease)   . Insomnia   . Arthritis   . Memory loss   . Emotional depression 02/04/2015   Plan:  I reviewed blood work results and records from emergency room.  She is losing weight.  Despite taking Zyprexa and Remeron she does not believe her appetite is improved.  Recently she is more anxious and sad because her mother died who lives next door.  Her sister is very involved.  I agree with her sister is planning that her niece will be staying with her [phone] to watch her closely.  I also believe she has memory impairment and she should keep appointment with her neurologist to discuss this issue.  At this time I will continue Zyprexa 20 mg at bedtime and Remeron 30 mg at bedtime and Klonopin 0.5 mg at bedtime.  We discussed medication side effects especially postural hypotension and risk of fall due to psychotropic medication.  But patient feels that new medicine did help her some sleep and she liked to go on higher dose..  I will try Belsomra 10 mg at bedtime however recommended if she started to have dizziness then she should stop the medication and call us back immediately.  Patient agreed with the plan.  I will see her again in 4-6 weeks.  Patient has appointment to see neurologist on 24th.  Recommended to call us back if she has any  question or any concern. Time spent 25 minutes.  More than 50% of the time spent in psychoeducation, counseling and coordination of care.  Discuss safety plan that anytime having active suicidal thoughts or homicidal thoughts then patient need to call 911 or go to the local emergency room.   Adean Milosevic T., MD 02/23/2015

## 2015-02-25 ENCOUNTER — Telehealth (HOSPITAL_COMMUNITY): Payer: Self-pay | Admitting: *Deleted

## 2015-02-25 ENCOUNTER — Other Ambulatory Visit (HOSPITAL_COMMUNITY): Payer: Self-pay | Admitting: Psychiatry

## 2015-02-25 NOTE — Telephone Encounter (Signed)
Medication refill - new order for Zyprexa e-scribed 02/22/15 and Remeron printed that date. Returns 03/22/15 so refills of each declined today as already taken care of by other means.

## 2015-02-25 NOTE — Telephone Encounter (Signed)
Sister(EC) called yesterday(02-24-15) and left message that patient needs prior authorization on Belsomra.   Called [facility] pharmacy. Pharmacist stated medication is not covered but providers can call [phone] and start a prior authorization.  I called and spoke with [facility]  Marden Noble stated patient is a Medicare patient I need to call [phone](Medicare department in [employer]) Called [phone]. I spoke with Maudie Mercury and per Maudie Mercury medication is not on formulary will need to do prior authorization. Medication was approved. Sister was notified. [id number]

## 2015-03-02 ENCOUNTER — Emergency Department (HOSPITAL_COMMUNITY)
Admission: EM | Admit: 2015-03-02 | Discharge: 2015-03-02 | Disposition: A | Discharge status: Home / Self Care | Payer: Medicare Other | Attending: Emergency Medicine | Admitting: Emergency Medicine

## 2015-03-02 ENCOUNTER — Encounter (HOSPITAL_COMMUNITY): Payer: Self-pay | Admitting: Emergency Medicine

## 2015-03-02 DIAGNOSIS — R197 Diarrhea, unspecified: Secondary | ICD-10-CM | POA: Insufficient documentation

## 2015-03-02 DIAGNOSIS — R52 Pain, unspecified: Secondary | ICD-10-CM | POA: Diagnosis not present

## 2015-03-02 DIAGNOSIS — F419 Anxiety disorder, unspecified: Secondary | ICD-10-CM | POA: Insufficient documentation

## 2015-03-02 DIAGNOSIS — G4700 Insomnia, unspecified: Secondary | ICD-10-CM | POA: Insufficient documentation

## 2015-03-02 DIAGNOSIS — R531 Weakness: Secondary | ICD-10-CM | POA: Insufficient documentation

## 2015-03-02 DIAGNOSIS — F329 Major depressive disorder, single episode, unspecified: Secondary | ICD-10-CM | POA: Insufficient documentation

## 2015-03-02 DIAGNOSIS — Z79899 Other long term (current) drug therapy: Secondary | ICD-10-CM | POA: Insufficient documentation

## 2015-03-02 DIAGNOSIS — E780 Pure hypercholesterolemia: Secondary | ICD-10-CM | POA: Insufficient documentation

## 2015-03-02 DIAGNOSIS — K219 Gastro-esophageal reflux disease without esophagitis: Secondary | ICD-10-CM | POA: Insufficient documentation

## 2015-03-02 DIAGNOSIS — M1990 Unspecified osteoarthritis, unspecified site: Secondary | ICD-10-CM | POA: Diagnosis not present

## 2015-03-02 DIAGNOSIS — R404 Transient alteration of awareness: Secondary | ICD-10-CM | POA: Diagnosis not present

## 2015-03-02 LAB — I-STAT CHEM 8, ED
BUN: 12 mg/dL (ref 6–23)
CHLORIDE: 106 mmol/L (ref 96–112)
CREATININE: 0.7 mg/dL (ref 0.50–1.10)
Calcium, Ion: 1.14 mmol/L (ref 1.13–1.30)
Glucose, Bld: 117 mg/dL — ABNORMAL HIGH (ref 70–99)
HEMATOCRIT: 43 % (ref 36.0–46.0)
HEMOGLOBIN: 14.6 g/dL (ref 12.0–15.0)
POTASSIUM: 3.4 mmol/L — AB (ref 3.5–5.1)
SODIUM: 144 mmol/L (ref 135–145)
TCO2: 21 mmol/L (ref 0–100)

## 2015-03-02 LAB — I-STAT TROPONIN, ED: Troponin i, poc: 0 ng/mL (ref 0.00–0.08)

## 2015-03-02 NOTE — ED Provider Notes (Signed)
CSN: [id number]     Arrival date & time 03/02/15  0120 History   First MD Initiated Contact with Patient 03/02/15 0424     Chief Complaint  Patient presents with  . Weakness     (Consider location/radiation/quality/duration/timing/severity/associated sxs/prior Treatment) Patient is a 64 y.o. female presenting with weakness. The history is provided by the patient. No language interpreter was used.  Weakness This is a chronic problem. Associated symptoms include weakness. Pertinent negatives include no abdominal pain, congestion, coughing, fever or headaches. Associated symptoms comments: Generalized weakness for "weeks" but worse tonight. She reports diarrhea. No fever or vomiting. She denies lateralized weakness but states she feels she will fall because of bilateral LE weakness. No falls. She also reports bilateral eye pain "from insomnia". Last seen by her doctor last week for same. .    Past Medical History  Diagnosis Date  . High cholesterol   . Depression   . Anxiety   . GERD (gastroesophageal reflux disease)   . Insomnia   . Arthritis   . Memory loss   . Emotional depression 02/04/2015   Past Surgical History  Procedure Laterality Date  . Cosmetic surgery    . Cesarean section    . Laproscopy     Family History  Problem Relation Age of Onset  . Sleep apnea Father   . Alcohol abuse Father   . Diabetes Mother   . Diabetes Sister   . Diabetes Maternal Uncle   . Diabetes Cousin    History  Substance Use Topics  . Smoking status: Never Smoker   . Smokeless tobacco: Never Used  . Alcohol Use: No   OB History    No data available     Review of Systems  Constitutional: Negative for fever.  HENT: Negative for congestion and trouble swallowing.   Eyes: Positive for pain.  Respiratory: Negative for cough and shortness of breath.   Gastrointestinal: Positive for diarrhea. Negative for abdominal pain.  Genitourinary: Negative.   Neurological: Positive for weakness.  Negative for syncope and headaches.      Allergies  Review of patient's allergies indicates no known allergies.  Home Medications   Prior to Admission medications   Medication Sig Start Date End Date Taking? Authorizing Provider  acetaminophen (TYLENOL) 500 MG tablet Take 1,000 mg by mouth every 6 (six) hours as needed for mild pain or headache.   Yes Historical Provider, MD  clonazePAM (KLONOPIN) 0.5 MG tablet Take 1 tablet (0.5 mg total) by mouth at bedtime. 02/22/15  Yes [name], MD  mirtazapine (REMERON SOL-TAB) 30 MG disintegrating tablet Take 1 tablet (30 mg total) by mouth at bedtime. For depression/insomnia 02/22/15  Yes [name], MD  OLANZapine zydis (ZYPREXA) 20 MG disintegrating tablet Take 1 tablet (20 mg total) by mouth at bedtime. For Insomnia, depression/mood control 02/22/15  Yes [name], MD  omeprazole (PRILOSEC) 20 MG capsule Take 1 capsule (20 mg total) by mouth daily. For acid reflux 12/20/14  Yes Encarnacion Slates, NP  pravastatin (PRAVACHOL) 20 MG tablet Take 1 tablet (20 mg total) by mouth at bedtime. For high cholesterol 12/20/14  Yes Encarnacion Slates, NP  Suvorexant (BELSOMRA) 10 MG TABS Take 10 mg by mouth at bedtime. 02/22/15  Yes [name], MD  cyanocobalamin (,VITAMIN B-12,) 1000 MCG/ML injection Inject 1 mL (1,000 mcg total) into the skin once a week. For bone health Patient not taking: Reported on 03/02/2015 12/20/14   Encarnacion Slates, NP  polyvinyl alcohol (LIQUIFILM TEARS) 1.4 % ophthalmic solution Place 1 drop into both eyes as needed for dry eyes. Patient not taking: Reported on 03/02/2015 12/20/14   Encarnacion Slates, NP  tamsulosin (FLOMAX) 0.4 MG CAPS capsule Take 1 capsule (0.4 mg total) by mouth daily after supper. For frequent urination/urgency Patient not taking: Reported on 03/02/2015 12/20/14   Encarnacion Slates, NP  Vitamin D, Ergocalciferol, (DRISDOL) 50000 UNITS CAPS capsule Take 1 capsule (50,000 Units total) by mouth every 7 (seven) days. On Wednesday: For  bone health Patient not taking: Reported on 03/02/2015 12/20/14   Encarnacion Slates, NP   BP 111/60 mmHg  Pulse 80  Temp(Src) 97.9 F (36.6 C) (Oral)  Resp 18  Ht 5\' 3"  (1.6 m)  Wt 143 lb (64.864 kg)  BMI 25.34 kg/m2  SpO2 98% Physical Exam  Constitutional: She is oriented to person, place, and time. She appears well-developed and well-nourished.  HENT:  Head: Normocephalic.  Neck: Normal range of motion. Neck supple.  Cardiovascular: Normal rate and regular rhythm.   Pulmonary/Chest: Effort normal and breath sounds normal.  Abdominal: Soft. Bowel sounds are normal. There is no tenderness. There is no rebound and no guarding.  Musculoskeletal: Normal range of motion.  Neurological: She is alert and oriented to person, place, and time.  Skin: Skin is warm and dry. No rash noted.  Psychiatric: She has a normal mood and affect.    ED Course  Procedures (including critical care time) Labs Review Labs Reviewed  I-STAT CHEM 8, ED - Abnormal; Notable for the following:    Potassium 3.4 (*)    Glucose, Bld 117 (*)    All other components within normal limits  I-STAT TROPOININ, ED    Imaging Review No results found.   EKG Interpretation None      MDM   Final diagnoses:  None   1. Weakness  Chart reviewed. The patient has been seen multiple times for same complaint. She has had 1-2 non-bloody bowel movements in the ED, no vomiting. VSS. Fluids given. She is felt stable for discharge.     Charlann Lange, PA-C 03/02/15 1572  April Palumbo, MD 03/02/15 [phone]

## 2015-03-02 NOTE — Discharge Instructions (Signed)

## 2015-03-02 NOTE — ED Notes (Signed)
Bed: WA15 Expected date:  Expected time:  Means of arrival:  Comments: EMS  

## 2015-03-02 NOTE — ED Notes (Signed)
Pt taken out via wheelchair by sister at [phone]

## 2015-03-02 NOTE — ED Notes (Signed)
Kendra Simmons (sister) stated she would be here in 10-15 minutes. Pt sitting quietly in room, no needs at this time. Taken to the bathroom around 10 minutes ago

## 2015-03-02 NOTE — ED Notes (Signed)
Pt arrived to the ED via EMS from home.  Pt states she has weakness in her legs and feels as if they are going to buckle.  Pt states she has been having medication changes by behavioral health and that it has not cured her of her insomnia.  Pt states this problem has been present for "some time."  Pt was seen previously in February for same complaint.  Pt states she took her medication at 2030hrs yesterday.  Pt states she got up to go to the bathroom and felt as if she was going to pass out.  Pt states her eyes hurt.

## 2015-03-10 ENCOUNTER — Encounter: Payer: Self-pay | Admitting: Diagnostic Neuroimaging

## 2015-03-10 ENCOUNTER — Ambulatory Visit (INDEPENDENT_AMBULATORY_CARE_PROVIDER_SITE_OTHER): Payer: Medicare Other | Admitting: Diagnostic Neuroimaging

## 2015-03-10 VITALS — BP 126/84 | HR 91 | Ht 64.0 in | Wt 145.0 lb

## 2015-03-10 DIAGNOSIS — F328 Other depressive episodes: Secondary | ICD-10-CM | POA: Diagnosis not present

## 2015-03-10 DIAGNOSIS — R4589 Other symptoms and signs involving emotional state: Secondary | ICD-10-CM

## 2015-03-10 DIAGNOSIS — R627 Adult failure to thrive: Secondary | ICD-10-CM

## 2015-03-10 DIAGNOSIS — F0390 Unspecified dementia without behavioral disturbance: Secondary | ICD-10-CM | POA: Diagnosis not present

## 2015-03-10 DIAGNOSIS — F329 Major depressive disorder, single episode, unspecified: Secondary | ICD-10-CM

## 2015-03-10 DIAGNOSIS — F03A0 Unspecified dementia, mild, without behavioral disturbance, psychotic disturbance, mood disturbance, and anxiety: Secondary | ICD-10-CM

## 2015-03-10 MED ORDER — MEMANTINE HCL 10 MG PO TABS: 10.0000 mg | ORAL_TABLET | Freq: Two times a day (BID) | ORAL | Status: DC

## 2015-03-10 NOTE — Progress Notes (Signed)
[facility]  PATIENT: Kendra Simmons DOB: 20-Sep-1951  REFERRING CLINICIAN:  HISTORY FROM: patient  REASON FOR VISIT: new consult   HISTORICAL  CHIEF COMPLAINT:  Chief Complaint  Patient presents with  . Follow-up    memory loss     HISTORY OF PRESENT ILLNESS:   UPDATE 03/10/15: Since last visit, continues to decline physically, mentally, emotionally. Poor hygeine, PO intake, physical abilities. Multiple ER visits for panic attacks, weakness, pain. Family struggling to care for her at home. Patient doesn't want to move to assisted living or long term care.  NEW HPI 08/27/14: 64 year old female here for evaluation of numbness in feet. Previous evaluated for memory and mood issues. Past 1-2 months patient has had gradual onset, progressive numbness and tingling in the toes and feet. Symptoms are symmetric. No pain. No burning or pins and needles. She feels a mild annoying numbness sensation. No low back pain. No proximal symptoms. No symptoms in her hands.  UPDATE 01/04/14: Doing well. No new issues. Memory and mood issues are stable. She is concerned about cost of namenda (> $300 per month) and she cannot afford it. Also, she tells me that her dementia diagnosis is not clear, and that she was told that her memory issues are related to mood and pain issues.   PRIOR HPI (03/20/12, Dr. Erling Cruz): 64 year old right-handed white single female with a 4-1/2 year history of falls causing left elbow fracture 12/13/06. She is being followed by Dr. Caryl Comes and has been evaluated with a tilt table. She fell 05/23/10. She does not have syncope with falls. There is no warning. At times she feels sluggish before falling. She denies bowel or bladder incontinence. Workup in the hospital for falls has included CT scans of the brain 12/13/06 and 09/01/07 showing bifrontal atrophy. MRI study of the brain 01/12/10 shows increased hyperintensity on T2 study of the  bilateral petrous apices representing  pneumatized bone. The MRI also shows focal atrophy. She has vitamin B12 deficiency with methylmalonic acid of 207 homocystine 14.7 on 01/18/10. Other studies have shown vitamin D deficiency. RPR nonreactive, B12 254, cortisol 12.2, TSH 1.93, Cholesterol 267,HDL 51, LDL 90, CBC normal, and CMP normal. She denies a family history of dementia. She has a history of depression previously followed by [name] but is now going to the  [facility] .She is sleeping much better, feels much better, and has increased appetite. She is not having hallucinations. She is not having headaches, visual loss, double vision, or swallowing problems. She is able to live by herself and  drives a car which her sister says has been safe. The patient states "my head is clear" though I still have problems sleeping at night.08/29/2012= MMSE 25/30. Clock drawing task 4/4. Animal  fluency task 13. Ron Parker Index of independence in activities of daily living 6.  Instrumental  activities of daily living scale 7.  Neuropsychiatric inventory one for hallucinations, 5 for agitation, 6 for depression, 6 for anxiety, 5 for disinhibition,  5 for irritability, 8 for night time behaviors, and 6 for appetite change. Geriatric depression scale 7/15.  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 CBC, CMP, TSH  were normal except for slightly elevated liver function tests and  TSH 8.27 on 07/17/2011. She is doing well on Namenda. Her only complaint is lower jaw pain from a dry socket at tooth #18. She is concerned about weight gain on Abilify.  REVIEW  OF SYSTEMS: Full 14 system review of systems performed and notable only for numbness depression anxiety not enough sleep racing thoughts.    ALLERGIES: No Known Allergies  HOME MEDICATIONS: Outpatient Prescriptions Prior to Visit  Medication Sig Dispense Refill  . acetaminophen (TYLENOL) 500 MG tablet Take 1,000 mg by mouth every 6 (six)  hours as needed for mild pain or headache.    . clonazePAM (KLONOPIN) 0.5 MG tablet Take 1 tablet (0.5 mg total) by mouth at bedtime. 30 tablet 0  . cyanocobalamin (,VITAMIN B-12,) 1000 MCG/ML injection Inject 1 mL (1,000 mcg total) into the skin once a week. For bone health 1 mL 0  . mirtazapine (REMERON SOL-TAB) 30 MG disintegrating tablet Take 1 tablet (30 mg total) by mouth at bedtime. For depression/insomnia 30 tablet 0  . OLANZapine zydis (ZYPREXA) 20 MG disintegrating tablet Take 1 tablet (20 mg total) by mouth at bedtime. For Insomnia, depression/mood control 30 tablet 0  . omeprazole (PRILOSEC) 20 MG capsule Take 1 capsule (20 mg total) by mouth daily. For acid reflux    . polyvinyl alcohol (LIQUIFILM TEARS) 1.4 % ophthalmic solution Place 1 drop into both eyes as needed for dry eyes. 15 mL 0  . pravastatin (PRAVACHOL) 20 MG tablet Take 1 tablet (20 mg total) by mouth at bedtime. For high cholesterol    . Suvorexant (BELSOMRA) 10 MG TABS Take 10 mg by mouth at bedtime. 30 tablet 0  . tamsulosin (FLOMAX) 0.4 MG CAPS capsule Take 1 capsule (0.4 mg total) by mouth daily after supper. For frequent urination/urgency 15 capsule 0  . Vitamin D, Ergocalciferol, (DRISDOL) 50000 UNITS CAPS capsule Take 1 capsule (50,000 Units total) by mouth every 7 (seven) days. On Wednesday: For bone health 30 capsule    No facility-administered medications prior to visit.    PAST MEDICAL HISTORY: Past Medical History  Diagnosis Date  . High cholesterol   . Depression   . Anxiety   . GERD (gastroesophageal reflux disease)   . Insomnia   . Arthritis   . Memory loss   . Emotional depression 02/04/2015    PAST SURGICAL HISTORY: Past Surgical History  Procedure Laterality Date  . Cosmetic surgery    . Cesarean section    . Laproscopy      FAMILY HISTORY: Family History  Problem Relation Age of Onset  . Sleep apnea Father   . Alcohol abuse Father   . Diabetes Mother   . Diabetes Sister   .  Diabetes Maternal Uncle   . Diabetes Cousin     SOCIAL HISTORY:  History   Social History  . Marital Status: Divorced    Spouse Name: N/A  . Number of Children: 3  . Years of Education: 14   Occupational History  . Retired    Social History Main Topics  . Smoking status: Never Smoker   . Smokeless tobacco: Never Used  . Alcohol Use: No  . Drug Use: No     Comment: Denies any drug use other than benzos  . Sexual Activity: No   Other Topics Concern  . Not on file   Social History Narrative   Patient lives at home alone.   Caffeine Use:  2 cokes daily   Sister lives next door.     PHYSICAL EXAM  Filed Vitals:   03/10/15 1505  BP: 126/84  Pulse: 91  Height: 5\' 4"  (1.626 m)  Weight: 145 lb (65.772 kg)    Not recorded  Body mass index is 24.88 kg/(m^2).  MMSE - Mini Mental State Exam 03/10/2015  Orientation to time 4  Orientation to Place 5  Registration 3  Attention/ Calculation 1  Recall 0  Language- name 2 objects 2  Language- repeat 1  Language- follow 3 step command 3  Language- read & follow direction 1  Write a sentence 1  Copy design 0  Total score 21     GENERAL EXAM: Patient is in no distress; well developed, nourished and groomed; neck is supple  CARDIOVASCULAR: Regular rate and rhythm, no murmurs, no carotid bruits  NEUROLOGIC: MENTAL STATUS: awake, alert, SLOW RESPONSES, TANGENTIAL, language fluent, comprehension intact, naming intact, fund of knowledge appropriate; MMSE 21/30. POSITIVE SNOUT, POSITIVE MYERSONS, POSITIVE PALMOMENTAL; DEPRESSED / FLAT AFFECT CRANIAL NERVE: pupils equal and reactive to light, visual fields full to confrontation, extraocular muscles intact, no nystagmus, facial sensation and strength symmetric, hearing intact, palate elevates symmetrically, uvula midline, shoulder shrug symmetric, tongue midline. MOTOR:  normal bulk and tone, DIFFUSE 4+/5 strength in the BUE, BLE; MILD POSTURAL TREMOR; MILD  BRADYKINESIA IN BUE AND BLE SENSORY: SLIGHTLY DECR TO PP IN TOES; RIGHT TOE VIB 7 SEC; LEFT TOE VIB 10 SEC. COORDINATION: finger-nose-finger, fine finger movements normal REFLEXES: BUE 2, KNEES 1, RIGHT ANKLE 1, LEFT ANKLE 0. DOWN GOING TOES GAIT/STATION: narrow based gait; STOOPED POSTURE, SMALL SHORT STEPS, ROMBERG NEG.    DIAGNOSTIC DATA (LABS, IMAGING, TESTING) - I reviewed patient records, labs, notes, testing and imaging myself where available.  Lab Results  Component Value Date   WBC 4.8 02/08/2015   HGB 14.6 03/02/2015   HCT 43.0 03/02/2015   MCV 92.1 02/08/2015   PLT 124* 02/08/2015      Component Value Date/Time   NA 144 03/02/2015 0208   K 3.4* 03/02/2015 0208   CL 106 03/02/2015 0208   CO2 26 02/08/2015 1813   GLUCOSE 117* 03/02/2015 0208   BUN 12 03/02/2015 0208   CREATININE 0.70 03/02/2015 0208   CALCIUM 9.1 02/08/2015 1813   PROT 7.0 01/22/2015 1413   ALBUMIN 4.3 01/22/2015 1413   AST 20 01/22/2015 1413   ALT 11 01/22/2015 1413   ALKPHOS 50 01/22/2015 1413   BILITOT 1.0 01/22/2015 1413   GFRNONAA 77* 02/08/2015 1813   GFRAA 89* 02/08/2015 1813   Lab Results  Component Value Date   CHOL 177 11/04/2014   Lab Results  Component Value Date   HGBA1C 5.8* 11/04/2014   No results found for: VITAMINB12 Lab Results  Component Value Date   TSH 2.870 10/16/2014    I reviewed images myself and agree with interpretation. -VRP  08/15/11 MRI brain - Non-specific enlargement of the subarachnoid spaces over the bifrontal region, without definite underlying cortical atrophy.  02/06/14 CT head / cervical spine - Generalized atrophy. No acute intracranial abnormalities. Mild scattered degenerative disc and facet disease changes of the cervical spine. No acute cervical spine abnormalities.     ASSESSMENT AND PLAN  64 y.o. year old female here with depression, insomnia, chronic pain, and progressive cognitive decline, frontal release signs, brain atrophy on CT.  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 Significant loss of ability to perform ADLs. Poor insight.   PLAN: - restart memantine 10mg  BID - needs 24 hours supervision / care - palliative care consult for goals of care, planning, family support  Return in about 3 months (around 06/10/2015).  I spent 45  minutes of face to face time with patient. Greater than 50% of time was spent in counseling and coordination of care with patient. I spoke with patient, sister, niece in person. I spoke with son via phone.     Penni Bombard, MD 7/67/3419, 3:79 PM Certified in Neurology, Neurophysiology and Neuroimaging  [facility] [address] [place], [place] [address] [phone]

## 2015-03-14 ENCOUNTER — Telehealth: Payer: Self-pay | Admitting: Diagnostic Neuroimaging

## 2015-03-14 ENCOUNTER — Telehealth: Payer: Self-pay | Admitting: *Deleted

## 2015-03-14 NOTE — Telephone Encounter (Signed)
Yvette with [facility] is calling and states she received the order for care for this patient.  She needs to know is this a hospice or a palliative care order. Please call.

## 2015-03-14 NOTE — Telephone Encounter (Signed)
Spoke with Kendra Simmons on the phone and was able to tell her that Dr. Leta Baptist wanted the pt to get Palliative care for decision making in the future related to dementia. She thanked me

## 2015-03-15 NOTE — Telephone Encounter (Signed)
pls go ahead with order. -VRP

## 2015-03-15 NOTE — Telephone Encounter (Signed)
Spoke with Dewaine Oats and informed her that Dr Leta Baptist agreed with this order.

## 2015-03-15 NOTE — Telephone Encounter (Signed)
Kendra Simmons @ [phone] with Hospice and Pallative care @ [facility], requesting Dr. Leta Baptist will agree to change order to Hospice Consultation per sister's request.  Please call and advise.

## 2015-03-15 NOTE — Telephone Encounter (Signed)
Dr. Penumalli, please advise 

## 2015-03-16 NOTE — Telephone Encounter (Signed)
Dr. Konrad Dolores with Hospice @ [phone], stated sent nurse to check on Pallative care and wanted to inform Dr. Leta Baptist patient is not eligible for Hospice.  FYI

## 2015-03-16 NOTE — Telephone Encounter (Signed)
pls go ahead with palliative care order instead. -VRP

## 2015-03-22 ENCOUNTER — Ambulatory Visit (INDEPENDENT_AMBULATORY_CARE_PROVIDER_SITE_OTHER): Payer: Medicare Other | Admitting: Psychiatry

## 2015-03-22 ENCOUNTER — Encounter (HOSPITAL_COMMUNITY): Payer: Self-pay | Admitting: Psychiatry

## 2015-03-22 VITALS — BP 134/88 | HR 91 | Ht 64.5 in | Wt 147.4 lb

## 2015-03-22 DIAGNOSIS — G4700 Insomnia, unspecified: Principal | ICD-10-CM

## 2015-03-22 DIAGNOSIS — F331 Major depressive disorder, recurrent, moderate: Secondary | ICD-10-CM

## 2015-03-22 DIAGNOSIS — F09 Unspecified mental disorder due to known physiological condition: Secondary | ICD-10-CM | POA: Diagnosis not present

## 2015-03-22 DIAGNOSIS — F332 Major depressive disorder, recurrent severe without psychotic features: Secondary | ICD-10-CM

## 2015-03-22 DIAGNOSIS — F4310 Post-traumatic stress disorder, unspecified: Secondary | ICD-10-CM | POA: Diagnosis not present

## 2015-03-22 DIAGNOSIS — F5105 Insomnia due to other mental disorder: Secondary | ICD-10-CM

## 2015-03-22 MED ORDER — MIRTAZAPINE 30 MG PO TBDP: 30.0000 mg | ORAL_TABLET | Freq: Every day | ORAL | Status: DC

## 2015-03-22 MED ORDER — SUVOREXANT 10 MG PO TABS: 10.0000 mg | ORAL_TABLET | Freq: Every day | ORAL | Status: DC

## 2015-03-22 MED ORDER — OLANZAPINE 20 MG PO TBDP: 20.0000 mg | ORAL_TABLET | Freq: Every day | ORAL | Status: DC

## 2015-03-22 MED ORDER — CLONAZEPAM 0.5 MG PO TABS: 0.5000 mg | ORAL_TABLET | Freq: Every day | ORAL | Status: DC

## 2015-03-22 NOTE — Progress Notes (Signed)
[facility] Progress Note   Kendra Simmons [id number] 64 y.o.  03/22/2015 3:01 PM  Chief Complaint:  Medication management and follow-up.    History of Present Illness:  Kendra Simmons came for her follow-up appointment with her sister.  She was seen by a neurologist recently and diagnosed with dementia .  She is taking Aricept .  Patient is not happy that she was diagnosed with dementia.  She believe that her main issue is insomnia.  Despite taking multiple medication she continued to complain poor sleep and feeling tired due to lack of sleep.  However her sister endorse that she's been snorting and sleeping at night.  Her ADLs remain very limited.  She need 24 7 as sister is afraid to left her alone.  The patient denies any suicidal thoughts or homicidal thoughts but admitted some time feeling tired and withdrawn.  Her memory has been slowly declining .  There has some discussion about nursing home versus assisted living facility but patient is adamant about it.  Her sister is trying to get in touch with patient's son to discuss further look into this option.  Patient is taking Remeron, Zyprexa, belsamra and Klonopin.  She recently visited emergency room because of weakness however there were no new medication added.  Patient and her sister denies any irritability, anger, severe mood swing or any crying spells.  Patient continued to insist that she need more medication for insomnia however as per her sister she is sleeping better and she may have misperception of insomnia.  Patient agreed to see a therapist in this office for counseling.  She had appointment on 11th with Joaquim Lai.  Patient denies drinking or using any illegal substances.  She has been unable to see a hospice since the loss of her mother last month.  She feels sometimes sad and missed her mother but denies any suicidal thoughts or any homicidal thoughts.  Suicidal Ideation: No Plan Formed: No Patient has means to carry  out plan: No  Homicidal Ideation: No Plan Formed: No Patient has means to carry out plan: No  Past Psychiatric History/Hospitalization(s) Patient has multiple hospitalization. Her last hospitalization was January 2016 .  She has done one intensive outpatient program.  She has a history of hallucination, paranoia, severe depression. In the past she has seen [name], [name] and [name].  As per chart she has taken in the past doxepin, Xanax, Seroquel, amitriptyline, imipramine, temazepam , trazodone , Wellbutrin, Prozac and Vistaril.     Anxiety: Yes Bipolar Disorder: No Depression: Yes Mania: No Psychosis: History of hallucinations Schizophrenia: No Personality Disorder: No Hospitalization for psychiatric illness: Yes History of Electroconvulsive Shock Therapy: No Prior Suicide Attempts: No  Medical History; Patient has high cholesterol, memory impairment, GERD and palpitation.  She has history of frequent falls and seen by a neurologist in the past.  Her primary care physician is [name] in [place], [place].  Review of Systems  Constitutional: Positive for weight loss and malaise/fatigue.  Neurological: Negative for headaches.  Psychiatric/Behavioral: Positive for memory loss. The patient has insomnia.    Psychiatric: Agitation: No Hallucination: No Depressed Mood: No Insomnia: Yes Hypersomnia: No Altered Concentration: No Feels Worthless: No Grandiose Ideas: No Belief In Special Powers: No New/Increased Substance Abuse: No Compulsions: No  Neurologic: Headache: No Seizure: No Paresthesias: Yes   Musculoskeletal: Strength & Muscle Tone: within normal limits Gait & Station: normal Patient leans: N/A   Outpatient Encounter Prescriptions as of 03/22/2015  Medication Sig  . acetaminophen (TYLENOL) 500 MG tablet Take 1,000 mg by mouth every 6 (six) hours as needed for mild pain or headache.  . clonazePAM (KLONOPIN) 0.5 MG tablet Take 1 tablet (0.5 mg  total) by mouth at bedtime.  . cyanocobalamin (,VITAMIN B-12,) 1000 MCG/ML injection Inject 1 mL (1,000 mcg total) into the skin once a week. For bone health  . memantine (NAMENDA) 10 MG tablet Take 1 tablet (10 mg total) by mouth 2 (two) times daily.  . mirtazapine (REMERON SOL-TAB) 30 MG disintegrating tablet Take 1 tablet (30 mg total) by mouth at bedtime. For depression/insomnia  . OLANZapine zydis (ZYPREXA) 20 MG disintegrating tablet Take 1 tablet (20 mg total) by mouth at bedtime. For Insomnia, depression/mood control  . omeprazole (PRILOSEC) 20 MG capsule Take 1 capsule (20 mg total) by mouth daily. For acid reflux  . polyvinyl alcohol (LIQUIFILM TEARS) 1.4 % ophthalmic solution Place 1 drop into both eyes as needed for dry eyes.  . pravastatin (PRAVACHOL) 20 MG tablet Take 1 tablet (20 mg total) by mouth at bedtime. For high cholesterol  . Suvorexant (BELSOMRA) 10 MG TABS Take 10 mg by mouth at bedtime.  . tamsulosin (FLOMAX) 0.4 MG CAPS capsule Take 1 capsule (0.4 mg total) by mouth daily after supper. For frequent urination/urgency  . Vitamin D, Ergocalciferol, (DRISDOL) 50000 UNITS CAPS capsule Take 1 capsule (50,000 Units total) by mouth every 7 (seven) days. On Wednesday: For bone health  . [DISCONTINUED] clonazePAM (KLONOPIN) 0.5 MG tablet Take 1 tablet (0.5 mg total) by mouth at bedtime.  . [DISCONTINUED] mirtazapine (REMERON SOL-TAB) 30 MG disintegrating tablet Take 1 tablet (30 mg total) by mouth at bedtime. For depression/insomnia  . [DISCONTINUED] OLANZapine zydis (ZYPREXA) 20 MG disintegrating tablet Take 1 tablet (20 mg total) by mouth at bedtime. For Insomnia, depression/mood control  . [DISCONTINUED] Suvorexant (BELSOMRA) 10 MG TABS Take 10 mg by mouth at bedtime.    Recent Results (from the past 2160 hour(s))  CBC with Differential/Platelet     Status: Abnormal   Collection Time: 01/22/15  2:13 PM  Result Value Ref Range   WBC 6.4 4.0 - 10.5 K/uL   RBC 4.65 3.87 - 5.11  MIL/uL   Hemoglobin 13.9 12.0 - 15.0 g/dL   HCT 42.1 36.0 - 46.0 %   MCV 90.5 78.0 - 100.0 fL   MCH 29.9 26.0 - 34.0 pg   MCHC 33.0 30.0 - 36.0 g/dL   RDW 13.1 11.5 - 15.5 %   Platelets 140 (L) 150 - 400 K/uL   Neutrophils Relative % 69 43 - 77 %   Neutro Abs 4.4 1.7 - 7.7 K/uL   Lymphocytes Relative 24 12 - 46 %   Lymphs Abs 1.5 0.7 - 4.0 K/uL   Monocytes Relative 6 3 - 12 %   Monocytes Absolute 0.4 0.1 - 1.0 K/uL   Eosinophils Relative 1 0 - 5 %   Eosinophils Absolute 0.1 0.0 - 0.7 K/uL   Basophils Relative 0 0 - 1 %   Basophils Absolute 0.0 0.0 - 0.1 K/uL  Comprehensive metabolic panel     Status: None   Collection Time: 01/22/15  2:13 PM  Result Value Ref Range   Sodium 141 135 - 145 mmol/L   Potassium 3.5 3.5 - 5.1 mmol/L   Chloride 105 96 - 112 mmol/L   CO2 24 19 - 32 mmol/L   Glucose, Bld 88 70 - 99 mg/dL   BUN 19 6 - 23  mg/dL   Creatinine, Ser 0.67 0.50 - 1.10 mg/dL   Calcium 9.3 8.4 - 10.5 mg/dL   Total Protein 7.0 6.0 - 8.3 g/dL   Albumin 4.3 3.5 - 5.2 g/dL   AST 20 0 - 37 U/L   ALT 11 0 - 35 U/L   Alkaline Phosphatase 50 39 - 117 U/L   Total Bilirubin 1.0 0.3 - 1.2 mg/dL   GFR calc non Af Amer >90 >90 mL/min   GFR calc Af Amer >90 >90 mL/min    Comment: (NOTE) The eGFR has been calculated using the CKD EPI equation. This calculation has not been validated in all clinical situations. eGFR's persistently <90 mL/min signify possible Chronic Kidney Disease.    Anion gap 12 5 - 15  Troponin I     Status: None   Collection Time: 01/22/15  2:13 PM  Result Value Ref Range   Troponin I <0.03 <0.031 ng/mL    Comment:        NO INDICATION OF MYOCARDIAL INJURY.   Magnesium     Status: None   Collection Time: 01/22/15  2:13 PM  Result Value Ref Range   Magnesium 2.1 1.5 - 2.5 mg/dL  Phosphorus     Status: None   Collection Time: 01/22/15  2:13 PM  Result Value Ref Range   Phosphorus 3.5 2.3 - 4.6 mg/dL  Urinalysis, Routine w reflex microscopic     Status:  Abnormal   Collection Time: 01/22/15  2:35 PM  Result Value Ref Range   Color, Urine AMBER (A) YELLOW    Comment: BIOCHEMICALS MAY BE AFFECTED BY COLOR   APPearance CLEAR CLEAR   Specific Gravity, Urine 1.031 (H) 1.005 - 1.030   pH 6.0 5.0 - 8.0   Glucose, UA NEGATIVE NEGATIVE mg/dL   Hgb urine dipstick NEGATIVE NEGATIVE   Bilirubin Urine MODERATE (A) NEGATIVE   Ketones, ur >80 (A) NEGATIVE mg/dL   Protein, ur NEGATIVE NEGATIVE mg/dL   Urobilinogen, UA 1.0 0.0 - 1.0 mg/dL   Nitrite NEGATIVE NEGATIVE   Leukocytes, UA SMALL (A) NEGATIVE  Urine microscopic-add on     Status: None   Collection Time: 01/22/15  2:35 PM  Result Value Ref Range   Squamous Epithelial / LPF RARE RARE   WBC, UA 3-6 <3 WBC/hpf   RBC / HPF 0-2 <3 RBC/hpf  CBC  ([facility])     Status: Abnormal   Collection Time: 02/08/15  6:13 PM  Result Value Ref Range   WBC 4.8 4.0 - 10.5 K/uL   RBC 4.31 3.87 - 5.11 MIL/uL   Hemoglobin 13.1 12.0 - 15.0 g/dL   HCT 39.7 36.0 - 46.0 %   MCV 92.1 78.0 - 100.0 fL   MCH 30.4 26.0 - 34.0 pg   MCHC 33.0 30.0 - 36.0 g/dL   RDW 13.0 11.5 - 15.5 %   Platelets 124 (L) 150 - 400 K/uL  Basic metabolic panel  (at [facility])     Status: Abnormal   Collection Time: 02/08/15  6:13 PM  Result Value Ref Range   Sodium 145 135 - 145 mmol/L   Potassium 4.1 3.5 - 5.1 mmol/L   Chloride 114 (H) 96 - 112 mmol/L   CO2 26 19 - 32 mmol/L   Glucose, Bld 98 70 - 99 mg/dL   BUN 11 6 - 23 mg/dL   Creatinine, Ser 0.80 0.50 - 1.10 mg/dL   Calcium 9.1 8.4 - 10.5 mg/dL  GFR calc non Af Amer 77 (L) >90 mL/min   GFR calc Af Amer 89 (L) >90 mL/min    Comment: (NOTE) The eGFR has been calculated using the CKD EPI equation. This calculation has not been validated in all clinical situations. eGFR's persistently <90 mL/min signify possible Chronic Kidney Disease.    Anion gap 5 5 - 15  Urinalysis, Routine w reflex microscopic     Status: Abnormal   Collection Time: 02/08/15   7:27 PM  Result Value Ref Range   Color, Urine AMBER (A) YELLOW    Comment: BIOCHEMICALS MAY BE AFFECTED BY COLOR   APPearance CLOUDY (A) CLEAR   Specific Gravity, Urine 1.023 1.005 - 1.030   pH 6.0 5.0 - 8.0   Glucose, UA NEGATIVE NEGATIVE mg/dL   Hgb urine dipstick NEGATIVE NEGATIVE   Bilirubin Urine SMALL (A) NEGATIVE   Ketones, ur NEGATIVE NEGATIVE mg/dL   Protein, ur NEGATIVE NEGATIVE mg/dL   Urobilinogen, UA 0.2 0.0 - 1.0 mg/dL   Nitrite NEGATIVE NEGATIVE   Leukocytes, UA SMALL (A) NEGATIVE  Urine microscopic-add on     Status: Abnormal   Collection Time: 02/08/15  7:27 PM  Result Value Ref Range   Squamous Epithelial / LPF RARE RARE   WBC, UA 3-6 <3 WBC/hpf   Bacteria, UA FEW (A) RARE   Crystals CA OXALATE CRYSTALS (A) NEGATIVE   Urine-Other MUCOUS PRESENT   I-Stat Troponin, ED (not at [facility])     Status: None   Collection Time: 03/02/15  2:05 AM  Result Value Ref Range   Troponin i, poc 0.00 0.00 - 0.08 ng/mL   Comment 3            Comment: Due to the release kinetics of cTnI, a negative result within the first hours of the onset of symptoms does not rule out myocardial infarction with certainty. If myocardial infarction is still suspected, repeat the test at appropriate intervals.   I-stat chem 8, ed     Status: Abnormal   Collection Time: 03/02/15  2:08 AM  Result Value Ref Range   Sodium 144 135 - 145 mmol/L   Potassium 3.4 (L) 3.5 - 5.1 mmol/L   Chloride 106 96 - 112 mmol/L   BUN 12 6 - 23 mg/dL   Creatinine, Ser 0.70 0.50 - 1.10 mg/dL   Glucose, Bld 117 (H) 70 - 99 mg/dL   Calcium, Ion 1.14 1.13 - 1.30 mmol/L   TCO2 21 0 - 100 mmol/L   Hemoglobin 14.6 12.0 - 15.0 g/dL   HCT 43.0 36.0 - 46.0 %      Constitutional:  BP 134/88 mmHg  Pulse 91  Ht 5' 4.5" (1.638 m)  Wt 147 lb 6.4 oz (66.86 kg)  BMI 24.92 kg/m2   Mental Status Examination;  Patient is casually dressed and fairly groomed. She appears tired and fatigued.  Her speech is slow, clear and  coherent.  Her thought process slow and logical.  She described her mood sad and her affect is constricted. Her psychomotor activity is slightly decreased.  She denies any active or passive suicidal thoughts or homicidal thought.  There were no delusions, paranoia or any obsessive thoughts.  Her attention and concentration is fair.  Her fund of knowledge is average.  She has difficulty remembering things.  She has no tremors or shakes.  She is alert and oriented 3.  Her insight, judgment and impulse control is fair.   Review of Psycho-Social Stressors (1), Review or order  clinical lab tests (1), Review and summation of old records (2), Review of Last Therapy Session (1), Review of Medication Regimen & Side Effects (2) and Review of New Medication or Change in Dosage (2)  Assessment: Axis I: Maj. depressive disorder, recurrent severe, posttraumatic stress disorder, cognitive disorder NOS  Axis II: Deferred  Axis III:  Past Medical History  Diagnosis Date  . High cholesterol   . Depression   . Anxiety   . GERD (gastroesophageal reflux disease)   . Insomnia   . Arthritis   . Memory loss   . Emotional depression 02/04/2015   Plan:  At this time I will continue Remeron 30 mg at bedtime, Zyprexa 20 mg at bedtime, Klonopin 0.5 mg at bedtime and Balsamra 10 milligram at bedtime.  Discussed medication side effects especially causing postural hypotension, dizziness and risk of fall.  I do encourage her to talk to the family member about memory care and assisted living facility.  Patient does require 24 7 due to decrease ADLs.  At this time her family members are helping her.  We will defer any further adjustment or increasing her medication.  Reviewed blood work results, collateral information from her neurologist and her current medication.  Patient is scheduled to see Joaquim Lai next week for counseling.  I will see her again in 3 months.  Recommended to call us back if she has any question or any  concern.  Time spent 25 minutes.  More than 50% of the time spent in psychoeducation, counseling and coordination of care.  Discuss safety plan that anytime having active suicidal thoughts or homicidal thoughts then patient need to call 911 or go to the local emergency room.   ARFEEN,SYED T., MD 03/22/2015

## 2015-03-28 DIAGNOSIS — B372 Candidiasis of skin and nail: Secondary | ICD-10-CM | POA: Diagnosis not present

## 2015-03-28 DIAGNOSIS — F329 Major depressive disorder, single episode, unspecified: Secondary | ICD-10-CM | POA: Diagnosis not present

## 2015-03-28 DIAGNOSIS — N390 Urinary tract infection, site not specified: Secondary | ICD-10-CM | POA: Diagnosis not present

## 2015-03-28 DIAGNOSIS — F0390 Unspecified dementia without behavioral disturbance: Secondary | ICD-10-CM | POA: Diagnosis not present

## 2015-03-28 DIAGNOSIS — E538 Deficiency of other specified B group vitamins: Secondary | ICD-10-CM | POA: Diagnosis not present

## 2015-03-28 DIAGNOSIS — K219 Gastro-esophageal reflux disease without esophagitis: Secondary | ICD-10-CM | POA: Diagnosis not present

## 2015-03-28 DIAGNOSIS — E785 Hyperlipidemia, unspecified: Secondary | ICD-10-CM | POA: Diagnosis not present

## 2015-03-30 ENCOUNTER — Ambulatory Visit (HOSPITAL_COMMUNITY): Payer: Self-pay | Admitting: Clinical

## 2015-04-19 DIAGNOSIS — B372 Candidiasis of skin and nail: Secondary | ICD-10-CM | POA: Diagnosis not present

## 2015-05-20 ENCOUNTER — Telehealth (HOSPITAL_COMMUNITY): Payer: Self-pay

## 2015-05-23 ENCOUNTER — Ambulatory Visit (HOSPITAL_COMMUNITY)
Admission: RE | Admit: 2015-05-23 | Discharge: 2015-05-23 | Disposition: A | Discharge status: Home / Self Care | Payer: Medicare Other | Attending: Psychiatry | Admitting: Psychiatry

## 2015-05-23 ENCOUNTER — Encounter (HOSPITAL_COMMUNITY): Payer: Self-pay | Admitting: *Deleted

## 2015-05-23 ENCOUNTER — Emergency Department (HOSPITAL_COMMUNITY)
Admission: EM | Admit: 2015-05-23 | Discharge: 2015-05-24 | Disposition: A | Discharge status: Other Acute Inpatient Hospital | Payer: Medicare Other | Attending: Emergency Medicine | Admitting: Emergency Medicine

## 2015-05-23 ENCOUNTER — Telehealth (HOSPITAL_COMMUNITY): Payer: Self-pay

## 2015-05-23 DIAGNOSIS — F332 Major depressive disorder, recurrent severe without psychotic features: Secondary | ICD-10-CM | POA: Insufficient documentation

## 2015-05-23 DIAGNOSIS — F411 Generalized anxiety disorder: Secondary | ICD-10-CM | POA: Diagnosis not present

## 2015-05-23 DIAGNOSIS — G4700 Insomnia, unspecified: Secondary | ICD-10-CM | POA: Diagnosis not present

## 2015-05-23 DIAGNOSIS — Z79899 Other long term (current) drug therapy: Secondary | ICD-10-CM | POA: Insufficient documentation

## 2015-05-23 DIAGNOSIS — R45851 Suicidal ideations: Secondary | ICD-10-CM | POA: Diagnosis not present

## 2015-05-23 DIAGNOSIS — E780 Pure hypercholesterolemia: Secondary | ICD-10-CM | POA: Diagnosis not present

## 2015-05-23 DIAGNOSIS — M1990 Unspecified osteoarthritis, unspecified site: Secondary | ICD-10-CM | POA: Insufficient documentation

## 2015-05-23 DIAGNOSIS — F333 Major depressive disorder, recurrent, severe with psychotic symptoms: Secondary | ICD-10-CM | POA: Insufficient documentation

## 2015-05-23 DIAGNOSIS — K219 Gastro-esophageal reflux disease without esophagitis: Secondary | ICD-10-CM | POA: Insufficient documentation

## 2015-05-23 DIAGNOSIS — F419 Anxiety disorder, unspecified: Secondary | ICD-10-CM | POA: Insufficient documentation

## 2015-05-23 LAB — COMPREHENSIVE METABOLIC PANEL
ALBUMIN: 4.5 g/dL (ref 3.5–5.0)
ALT: 12 U/L — ABNORMAL LOW (ref 14–54)
ANION GAP: 13 (ref 5–15)
AST: 25 U/L (ref 15–41)
Alkaline Phosphatase: 43 U/L (ref 38–126)
BUN: 13 mg/dL (ref 6–20)
CO2: 22 mmol/L (ref 22–32)
Calcium: 9.8 mg/dL (ref 8.9–10.3)
Chloride: 108 mmol/L (ref 101–111)
Creatinine, Ser: 0.84 mg/dL (ref 0.44–1.00)
GFR calc Af Amer: >60 mL/min (ref >60)
GFR calc non Af Amer: >60 mL/min (ref >60)
Glucose, Bld: 140 mg/dL — ABNORMAL HIGH (ref 65–99)
Potassium: 2.8 mmol/L — ABNORMAL LOW (ref 3.5–5.1)
Sodium: 143 mmol/L (ref 135–145)
Total Bilirubin: 0.6 mg/dL (ref 0.3–1.2)
Total Protein: 7.5 g/dL (ref 6.5–8.1)

## 2015-05-23 LAB — SALICYLATE LEVEL: Salicylate Lvl: <4 mg/dL (ref 2.8–30.0)

## 2015-05-23 LAB — RAPID URINE DRUG SCREEN, HOSP PERFORMED
AMPHETAMINES: NOT DETECTED
BARBITURATES: NOT DETECTED
Benzodiazepines: NOT DETECTED
Cocaine: NOT DETECTED
Opiates: NOT DETECTED
Tetrahydrocannabinol: NOT DETECTED

## 2015-05-23 LAB — ACETAMINOPHEN LEVEL: Acetaminophen (Tylenol), Serum: <10 ug/mL — ABNORMAL LOW (ref 10–30)

## 2015-05-23 LAB — CBC
HCT: 41 % (ref 36.0–46.0)
Hemoglobin: 14 g/dL (ref 12.0–15.0)
MCH: 31.3 pg (ref 26.0–34.0)
MCHC: 34.1 g/dL (ref 30.0–36.0)
MCV: 91.7 fL (ref 78.0–100.0)
PLATELETS: 185 10*3/uL (ref 150–400)
RBC: 4.47 MIL/uL (ref 3.87–5.11)
RDW: 14.4 % (ref 11.5–15.5)
WBC: 7.7 10*3/uL (ref 4.0–10.5)

## 2015-05-23 LAB — ETHANOL: Alcohol, Ethyl (B): <5 mg/dL (ref <5)

## 2015-05-23 MED ORDER — PANTOPRAZOLE SODIUM 40 MG PO TBEC
40.0000 mg | DELAYED_RELEASE_TABLET | Freq: Every day | ORAL | Status: DC
  Administered 2015-05-24: 40 mg via ORAL
  Filled 2015-05-23: qty 1

## 2015-05-23 MED ORDER — PRAVASTATIN SODIUM 20 MG PO TABS
20.0000 mg | ORAL_TABLET | Freq: Every day | ORAL | Status: DC
  Filled 2015-05-23 (×2): qty 1

## 2015-05-23 MED ORDER — ACETAMINOPHEN 500 MG PO TABS
1000.0000 mg | ORAL_TABLET | Freq: Four times a day (QID) | ORAL | Status: DC | PRN
  Administered 2015-05-23 – 2015-05-24 (×2): 1000 mg via ORAL
  Filled 2015-05-23 (×2): qty 2

## 2015-05-23 MED ORDER — OLANZAPINE 10 MG PO TBDP
20.0000 mg | ORAL_TABLET | Freq: Every day | ORAL | Status: DC
  Filled 2015-05-23: qty 2

## 2015-05-23 MED ORDER — MIRTAZAPINE 30 MG PO TBDP
30.0000 mg | ORAL_TABLET | Freq: Every day | ORAL | Status: DC
  Filled 2015-05-23 (×2): qty 1

## 2015-05-23 MED ORDER — MEMANTINE HCL 10 MG PO TABS
10.0000 mg | ORAL_TABLET | Freq: Two times a day (BID) | ORAL | Status: DC
  Administered 2015-05-24: 10 mg via ORAL
  Filled 2015-05-23 (×3): qty 1

## 2015-05-23 MED ORDER — SUVOREXANT 10 MG PO TABS
10.0000 mg | ORAL_TABLET | Freq: Every day | ORAL | Status: DC
  Filled 2015-05-23 (×2): qty 1

## 2015-05-23 MED ORDER — POTASSIUM CHLORIDE CRYS ER 20 MEQ PO TBCR
40.0000 meq | EXTENDED_RELEASE_TABLET | Freq: Once | ORAL | Status: AC
  Administered 2015-05-23: 40 meq via ORAL
  Filled 2015-05-23: qty 2

## 2015-05-23 MED ORDER — CLONAZEPAM 0.5 MG PO TABS
0.5000 mg | ORAL_TABLET | Freq: Every day | ORAL | Status: DC
  Filled 2015-05-23: qty 1

## 2015-05-23 NOTE — ED Notes (Signed)
Pt sent from [facility] with sitter d/t depression and SI.  Pt has no plans at this time.  Pt reports feeling hopeless and insomnia.
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
## 2015-05-23 NOTE — ED Provider Notes (Signed)
CSN: [id number]     Arrival date & time 05/23/15  1402 History   First MD Initiated Contact with Patient 05/23/15 1526     Chief Complaint  Patient presents with  . Suicidal     (Consider location/radiation/quality/duration/timing/severity/associated sxs/prior Treatment) HPI Patient presents with concern of suicidal ideation, depression. She notes over the past 7 days she has been reticular depressed, with inability to sleep, focus, perform typical activities of daily living. She denies any focal pain. Patient acknowledges a long history of depression, prior hospitalizations. Though she acknowledges suicidal ideation, she denies any specific plan.   Past Medical History  Diagnosis Date  . High cholesterol   . Depression   . Anxiety   . GERD (gastroesophageal reflux disease)   . Insomnia   . Arthritis   . Memory loss   . Emotional depression 02/04/2015   Past Surgical History  Procedure Laterality Date  . Cosmetic surgery    . Cesarean section    . Laproscopy     Family History  Problem Relation Age of Onset  . Sleep apnea Father   . Alcohol abuse Father   . Diabetes Mother   . Diabetes Sister   . Diabetes Maternal Uncle   . Diabetes Cousin    History  Substance Use Topics  . Smoking status: Never Smoker   . Smokeless tobacco: Never Used  . Alcohol Use: No   OB History    No data available     Review of Systems  Constitutional:       Per HPI, otherwise negative  HENT:       Per HPI, otherwise negative  Respiratory:       Per HPI, otherwise negative  Cardiovascular:       Per HPI, otherwise negative  Gastrointestinal: Negative for vomiting.  Endocrine:       Negative aside from HPI  Genitourinary:       Neg aside from HPI   Musculoskeletal:       Per HPI, otherwise negative  Skin: Negative.   Neurological: Negative for syncope.  Psychiatric/Behavioral: Positive for suicidal ideas, dysphoric mood and decreased concentration. The patient is  nervous/anxious. The patient is not hyperactive.       Allergies  Review of patient's allergies indicates no known allergies.  Home Medications   Prior to Admission medications   Medication Sig Start Date End Date Taking? Authorizing Provider  acetaminophen (TYLENOL) 500 MG tablet Take 1,000 mg by mouth every 6 (six) hours as needed for mild pain or headache.   Yes Historical Provider, MD  clonazePAM (KLONOPIN) 0.5 MG tablet Take 1 tablet (0.5 mg total) by mouth at bedtime. 03/22/15  Yes [name], MD  cyanocobalamin (,VITAMIN B-12,) 1000 MCG/ML injection Inject 1 mL (1,000 mcg total) into the skin once a week. For bone health 12/20/14  Yes Encarnacion Slates, NP  ibuprofen (ADVIL,MOTRIN) 200 MG tablet Take 800 mg by mouth every 6 (six) hours as needed.   Yes Historical Provider, MD  memantine (NAMENDA) 10 MG tablet Take 1 tablet (10 mg total) by mouth 2 (two) times daily. 03/10/15  Yes Penni Bombard, MD  mirtazapine (REMERON SOL-TAB) 30 MG disintegrating tablet Take 1 tablet (30 mg total) by mouth at bedtime. For depression/insomnia 03/22/15  Yes [name], MD  OLANZapine zydis (ZYPREXA) 20 MG disintegrating tablet Take 1 tablet (20 mg total) by mouth at bedtime. For Insomnia, depression/mood control 03/22/15  Yes [name], MD  omeprazole (  PRILOSEC) 20 MG capsule Take 1 capsule (20 mg total) by mouth daily. For acid reflux 12/20/14  Yes Encarnacion Slates, NP  Suvorexant (BELSOMRA) 10 MG TABS Take 10 mg by mouth at bedtime. 03/22/15  Yes [name], MD  tamsulosin (FLOMAX) 0.4 MG CAPS capsule Take 1 capsule (0.4 mg total) by mouth daily after supper. For frequent urination/urgency 12/20/14  Yes Encarnacion Slates, NP  nystatin (MYCOSTATIN/NYSTOP) 100000 UNIT/GM POWD Apply 1 application topically 2 (two) times daily. 03/28/15   Historical Provider, MD  polyvinyl alcohol (LIQUIFILM TEARS) 1.4 % ophthalmic solution Place 1 drop into both eyes as needed for dry eyes. Patient not taking: Reported on  03/22/2015 12/20/14   Encarnacion Slates, NP  pravastatin (PRAVACHOL) 20 MG tablet Take 1 tablet (20 mg total) by mouth at bedtime. For high cholesterol 12/20/14   Encarnacion Slates, NP  Vitamin D, Ergocalciferol, (DRISDOL) 50000 UNITS CAPS capsule Take 1 capsule (50,000 Units total) by mouth every 7 (seven) days. On Wednesday: For bone health Patient not taking: Reported on 03/22/2015 12/20/14   Encarnacion Slates, NP   BP 124/84 mmHg  Pulse 113  Temp(Src) 97.7 F (36.5 C) (Oral)  Resp 17  SpO2 99% Physical Exam  Constitutional: She is oriented to person, place, and time. She appears well-developed and well-nourished. No distress.  HENT:  Head: Normocephalic and atraumatic.  Eyes: Conjunctivae and EOM are normal.  Cardiovascular: Normal rate and regular rhythm.   Pulmonary/Chest: Effort normal and breath sounds normal. No stridor. No respiratory distress.  Abdominal: She exhibits no distension.  Musculoskeletal: She exhibits no edema.  Neurological: She is alert and oriented to person, place, and time. No cranial nerve deficit.  Skin: Skin is warm and dry.  Psychiatric: Her speech is delayed. She is slowed. Cognition and memory are not impaired. She exhibits a depressed mood. She expresses suicidal ideation. She expresses no suicidal plans.  Nursing note and vitals reviewed.   ED Course  Procedures (including critical care time) Labs Review Labs Reviewed  ACETAMINOPHEN LEVEL - Abnormal; Notable for the following:    Acetaminophen (Tylenol), Serum <10 (*)    All other components within normal limits  COMPREHENSIVE METABOLIC PANEL - Abnormal; Notable for the following:    Potassium 2.8 (*)    Glucose, Bld 140 (*)    ALT 12 (*)    All other components within normal limits  CBC  ETHANOL  SALICYLATE LEVEL  URINE RAPID DRUG SCREEN (HOSP PERFORMED) NOT AT [facility]    After the patient's initial evaluation I discussed her case with our behavioral health team. The patient vomited tachycardic, she's  medically clear for further evaluation.  MDM   Patient with a history of depression, prior hospital physicians presents with ongoing suicidal ideation. Patient is awake, alert, appropriately interactive. Patient has hopelessness and insomnia, but no focal pain, is in no distress. Patient's care was endorsed to our behavioral health team for disposition.  Carmin Muskrat, MD 05/23/15 [phone]

## 2015-05-23 NOTE — ED Notes (Signed)
Patient resting in bed with no s/s of distress noted currently. Pt with c/o SI but denies plan.

## 2015-05-23 NOTE — Telephone Encounter (Signed)
Patient came into [facility] emergency for an evaluation as directed.

## 2015-05-23 NOTE — ED Notes (Signed)
Pt informing RN that she does not want to take her medicine at this time. Pt states "They don't help me sleep anyways, so I don't need them". RN encouraged pt to take medications, but she again refused.

## 2015-05-23 NOTE — ED Notes (Signed)
Eric the [facility] from behavior health called and notified me that they will except patient once her potassium is corrected and redrawn notified rn ashley and doctor Eulis Foster

## 2015-05-23 NOTE — ED Notes (Signed)
Patient belongings: shirt, pants, shoes, purse, bra and underwear

## 2015-05-24 ENCOUNTER — Inpatient Hospital Stay (HOSPITAL_COMMUNITY)
Admission: AD | Admit: 2015-05-24 | Discharge: 2015-06-02 | DRG: 885 | Disposition: A | Discharge status: Home / Self Care | Payer: Medicare Other | Source: Intra-hospital | Attending: Emergency Medicine | Admitting: Emergency Medicine

## 2015-05-24 ENCOUNTER — Encounter (HOSPITAL_COMMUNITY): Payer: Self-pay | Admitting: *Deleted

## 2015-05-24 ENCOUNTER — Telehealth (HOSPITAL_COMMUNITY): Payer: Self-pay

## 2015-05-24 DIAGNOSIS — G3184 Mild cognitive impairment, so stated: Secondary | ICD-10-CM

## 2015-05-24 DIAGNOSIS — F333 Major depressive disorder, recurrent, severe with psychotic symptoms: Secondary | ICD-10-CM | POA: Diagnosis not present

## 2015-05-24 DIAGNOSIS — E785 Hyperlipidemia, unspecified: Secondary | ICD-10-CM | POA: Diagnosis present

## 2015-05-24 DIAGNOSIS — F411 Generalized anxiety disorder: Secondary | ICD-10-CM | POA: Diagnosis not present

## 2015-05-24 DIAGNOSIS — Z811 Family history of alcohol abuse and dependence: Secondary | ICD-10-CM | POA: Diagnosis not present

## 2015-05-24 DIAGNOSIS — E860 Dehydration: Secondary | ICD-10-CM | POA: Diagnosis present

## 2015-05-24 DIAGNOSIS — R45851 Suicidal ideations: Secondary | ICD-10-CM | POA: Insufficient documentation

## 2015-05-24 DIAGNOSIS — F0390 Unspecified dementia without behavioral disturbance: Secondary | ICD-10-CM | POA: Diagnosis not present

## 2015-05-24 DIAGNOSIS — E876 Hypokalemia: Secondary | ICD-10-CM | POA: Diagnosis not present

## 2015-05-24 DIAGNOSIS — F99 Mental disorder, not otherwise specified: Secondary | ICD-10-CM | POA: Diagnosis not present

## 2015-05-24 DIAGNOSIS — G4700 Insomnia, unspecified: Secondary | ICD-10-CM | POA: Diagnosis not present

## 2015-05-24 DIAGNOSIS — F331 Major depressive disorder, recurrent, moderate: Secondary | ICD-10-CM

## 2015-05-24 DIAGNOSIS — F410 Panic disorder [episodic paroxysmal anxiety] without agoraphobia: Secondary | ICD-10-CM | POA: Diagnosis not present

## 2015-05-24 DIAGNOSIS — R000 Tachycardia, unspecified: Secondary | ICD-10-CM | POA: Clinically undetermined

## 2015-05-24 DIAGNOSIS — Z833 Family history of diabetes mellitus: Secondary | ICD-10-CM | POA: Diagnosis not present

## 2015-05-24 DIAGNOSIS — F332 Major depressive disorder, recurrent severe without psychotic features: Secondary | ICD-10-CM | POA: Diagnosis present

## 2015-05-24 DIAGNOSIS — F419 Anxiety disorder, unspecified: Secondary | ICD-10-CM | POA: Diagnosis not present

## 2015-05-24 DIAGNOSIS — Z91410 Personal history of adult physical and sexual abuse: Secondary | ICD-10-CM | POA: Diagnosis not present

## 2015-05-24 DIAGNOSIS — K219 Gastro-esophageal reflux disease without esophagitis: Secondary | ICD-10-CM | POA: Diagnosis present

## 2015-05-24 DIAGNOSIS — E780 Pure hypercholesterolemia: Secondary | ICD-10-CM | POA: Diagnosis present

## 2015-05-24 DIAGNOSIS — M1990 Unspecified osteoarthritis, unspecified site: Secondary | ICD-10-CM | POA: Diagnosis present

## 2015-05-24 DIAGNOSIS — F5105 Insomnia due to other mental disorder: Secondary | ICD-10-CM | POA: Diagnosis present

## 2015-05-24 LAB — URINALYSIS, ROUTINE W REFLEX MICROSCOPIC
Glucose, UA: NEGATIVE mg/dL
HGB URINE DIPSTICK: NEGATIVE
KETONES UR: 15 mg/dL — AB
Leukocytes, UA: NEGATIVE
Nitrite: NEGATIVE
PH: 5.5 (ref 5.0–8.0)
Protein, ur: NEGATIVE mg/dL
Specific Gravity, Urine: 1.029 (ref 1.005–1.030)
UROBILINOGEN UA: 1 mg/dL (ref 0.0–1.0)

## 2015-05-24 LAB — POTASSIUM
POTASSIUM: 3.3 mmol/L — AB (ref 3.5–5.1)
Potassium: 3 mmol/L — ABNORMAL LOW (ref 3.5–5.1)

## 2015-05-24 MED ORDER — OLANZAPINE 10 MG PO TBDP
20.0000 mg | ORAL_TABLET | Freq: Every day | ORAL | Status: DC
  Administered 2015-05-24: 20 mg via ORAL
  Filled 2015-05-24 (×3): qty 2

## 2015-05-24 MED ORDER — ACETAMINOPHEN 500 MG PO TABS
1000.0000 mg | ORAL_TABLET | Freq: Four times a day (QID) | ORAL | Status: DC | PRN
  Administered 2015-05-24: 1000 mg via ORAL
  Filled 2015-05-24: qty 2

## 2015-05-24 MED ORDER — MAGNESIUM HYDROXIDE 400 MG/5ML PO SUSP: 30.0000 mL | Freq: Every day | ORAL | Status: DC | PRN

## 2015-05-24 MED ORDER — PANTOPRAZOLE SODIUM 40 MG PO TBEC
40.0000 mg | DELAYED_RELEASE_TABLET | Freq: Every day | ORAL | Status: DC
  Administered 2015-05-25 – 2015-06-02 (×9): 40 mg via ORAL
  Filled 2015-05-24 (×13): qty 1

## 2015-05-24 MED ORDER — POTASSIUM CHLORIDE CRYS ER 20 MEQ PO TBCR: 40.0000 meq | EXTENDED_RELEASE_TABLET | Freq: Once | ORAL | Status: DC

## 2015-05-24 MED ORDER — ACETAMINOPHEN 325 MG PO TABS
650.0000 mg | ORAL_TABLET | Freq: Four times a day (QID) | ORAL | Status: DC | PRN
  Administered 2015-05-25 – 2015-05-26 (×4): 650 mg via ORAL
  Filled 2015-05-24 (×4): qty 2

## 2015-05-24 MED ORDER — TRAZODONE HCL 50 MG PO TABS
50.0000 mg | ORAL_TABLET | Freq: Every evening | ORAL | Status: DC | PRN
  Administered 2015-05-24: 50 mg via ORAL
  Filled 2015-05-24: qty 1

## 2015-05-24 MED ORDER — CLONAZEPAM 0.5 MG PO TABS
0.5000 mg | ORAL_TABLET | Freq: Once | ORAL | Status: AC
  Administered 2015-05-24: 0.5 mg via ORAL
  Filled 2015-05-24: qty 1

## 2015-05-24 MED ORDER — CLONAZEPAM 0.5 MG PO TABS
0.5000 mg | ORAL_TABLET | Freq: Every day | ORAL | Status: DC
  Administered 2015-05-24: 0.5 mg via ORAL
  Filled 2015-05-24: qty 1

## 2015-05-24 MED ORDER — PRAVASTATIN SODIUM 20 MG PO TABS
20.0000 mg | ORAL_TABLET | Freq: Every day | ORAL | Status: DC
  Administered 2015-05-25 – 2015-06-01 (×8): 20 mg via ORAL
  Filled 2015-05-24 (×4): qty 1
  Filled 2015-05-24: qty 10
  Filled 2015-05-24 (×7): qty 1

## 2015-05-24 MED ORDER — SUVOREXANT 10 MG PO TABS
10.0000 mg | ORAL_TABLET | Freq: Every day | ORAL | Status: DC
  Administered 2015-05-24: 10 mg via ORAL
  Filled 2015-05-24 (×2): qty 1

## 2015-05-24 MED ORDER — MIRTAZAPINE 30 MG PO TBDP
30.0000 mg | ORAL_TABLET | Freq: Every day | ORAL | Status: DC
  Administered 2015-05-24: 30 mg via ORAL
  Filled 2015-05-24: qty 1
  Filled 2015-05-24: qty 2
  Filled 2015-05-24: qty 1

## 2015-05-24 MED ORDER — MEMANTINE HCL 10 MG PO TABS
10.0000 mg | ORAL_TABLET | Freq: Two times a day (BID) | ORAL | Status: DC
  Administered 2015-05-24 – 2015-06-02 (×18): 10 mg via ORAL
  Filled 2015-05-24 (×18): qty 1
  Filled 2015-05-24 (×2): qty 20
  Filled 2015-05-24 (×5): qty 1

## 2015-05-24 MED ORDER — POTASSIUM CHLORIDE CRYS ER 20 MEQ PO TBCR
60.0000 meq | EXTENDED_RELEASE_TABLET | Freq: Once | ORAL | Status: AC
  Administered 2015-05-24: 60 meq via ORAL
  Filled 2015-05-24: qty 3

## 2015-05-24 MED ORDER — ALUM & MAG HYDROXIDE-SIMETH 200-200-20 MG/5ML PO SUSP: 30.0000 mL | ORAL | Status: DC | PRN

## 2015-05-24 NOTE — Tx Team (Signed)
Initial Interdisciplinary Treatment Plan   PATIENT STRESSORS: Financial difficulties Health problems Medication change or noncompliance   PATIENT STRENGTHS: Ability for insight Average or above average intelligence Capable of independent living General fund of knowledge Motivation for treatment/growth Supportive family/friends   PROBLEM LIST: Problem List/Patient Goals Date to be addressed Date deferred Reason deferred Estimated date of resolution  "depression": 05/24/2015   D/c  "anxiety" 05/24/2015   D/c  "suicidal thoughts" 05/24/2015   D/c                                       DISCHARGE CRITERIA:  Ability to meet basic life and health needs Improved stabilization in mood, thinking, and/or behavior Medical problems require only outpatient monitoring Motivation to continue treatment in a less acute level of care Need for constant or close observation no longer present Reduction of life-threatening or endangering symptoms to within safe limits Verbal commitment to aftercare and medication compliance  PRELIMINARY DISCHARGE PLAN: Attend aftercare/continuing care group Attend PHP/IOP Outpatient therapy Return to previous living arrangement  PATIENT/FAMIILY INVOLVEMENT: This treatment plan has been presented to and reviewed with the patient, Kendra Simmons.  The patient and family have been given the opportunity to ask questions and make suggestions.  Cammy Copa 05/24/2015, 6:47 PM

## 2015-05-24 NOTE — ED Notes (Signed)
Patient refuses to swallow potassium pills. Pt states she will take pills dissolved. Currently waiting for pills to dissolve to administer to pt.
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
## 2015-05-24 NOTE — Telephone Encounter (Signed)
Patient is in the emergency room and will be validated by ER staff.

## 2015-05-24 NOTE — BH Assessment (Signed)
[place] Assessment Progress Note  Per Corena Pilgrim, MD, this pt requires psychiatric hospitalization at this time.  Letitia Libra, RN, [facility] to [facility] Rm 504-1.  Pt has signed Voluntary Admission and Consent for Treatment, as well as Consent to Release Information to Berniece Andreas, MD, her outpatient psychiatrist.  A notification call has been place to his office.  Signed forms have been faxed to [facility].  Pt's nurse has been notified, and agrees to send original paperwork along with pt via [place], and to call report to [phone].  Jalene Mullet, MA Triage Specialist [phone]

## 2015-05-24 NOTE — ED Notes (Signed)
Patient awake and is requesting a dose of Klonopin for anxiety. Kendra Simmons, [place] notified; new orders received.
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
## 2015-05-24 NOTE — Progress Notes (Signed)
D   Pt has been very anxious and depressed   Her thinking is disorganized and tangential   She is very focused on her medications especially her sleeping pills and expresses worry that she will not sleep tonight   Pt sister brought in her home medication and she received same   She reports not sleeping for 7 days    A   Verbal support given    Medications administered and effectiveness monitored   Q 15 min checks   Educate on her medications and the side effects R   Pt safe at present and verbalizes understanding

## 2015-05-24 NOTE — Consult Note (Signed)
[facility] Face-to-Face Psychiatry Consult   Reason for Consult:  Depression with suicidal ideations Referring Physician:  EDP Patient Identification: Kendra Simmons MRN:  [id number] Principal Diagnosis: Major depressive disorder, recurrent, severe without psychotic features Diagnosis:   Patient Active Problem List   Diagnosis Date Noted  . Insomnia [G47.00] 12/15/2014    Priority: High  . Major depressive disorder, recurrent, severe without psychotic features [F33.2]     Priority: High  . GAD (generalized anxiety disorder) [F41.1] 07/26/2014    Priority: High  . 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 Abulia [R69] 02/04/2015  . 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 Snoring [R06.83] 12/24/2014  . Unintended weight loss [R63.4] 12/24/2014  . Insomnia due to mental disorder [F48.9, F51.05] 12/24/2014  . 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 PTSD (post-traumatic stress disorder) [F43.10] 11/02/2014  . Panic attacks [F41.0] 07/26/2014  . Thrombocytopenia [D69.6] 12/17/2012  . CAP (community acquired pneumonia) [J18.9] 12/16/2012  . Toxic encephalopathy secondary to acute illness [G92] 12/16/2012  . DYSLIPIDEMIA [E78.5] 03/13/2010  . GERD [K21.9] 03/13/2010    Total Time spent with patient: 45 minutes  Subjective:   Kendra Simmons is a 64 y.o. female patient admitted with suicidal ideations, insomnia, and depression.  HPI:  64 y.o. female who came to [facility] with her sister requesting inpatient hospitalization for depression, SI and hearing voices. She states that she spoke with her psychiatrist- Dr. Boykin Peek who suggested she come to [facility] for an assessment and possible placement inpatient. Pt states that she has not been sleeping in the past week and has been dealing with insomnia for the past year. She has a long history of depression and multiple hospitalizations for depression and insomnia- 2  hospitalizations at [facility] since last July. She states that she is not starting to hear voices that "sound like a low murmer or children playing outside". She reports that she lost her mom in March and now lives alone in her mom's house. She states that this has been stressful for her. She also states that she has a daughter who was diagnosed with autism who lives in [place] that she is not able to see right now and that is a stressor for her as well. She has a lot of support from her sister who helps her pay her bills and helps with her basic needs. She also has a family member- her nephew's wife who comes and does her laundry and fixes her food. Pt states that she has lost 60lbs in the past year due to depression and is not eating well although she is hungry. 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 Pt states that she has racing thoughts that keep her up at night and can not seem to find medication that works long term. She states that she has been thinking about suicide because she feels that she would "be better off if she wasn't around". She denies having any plan to hurt herself or a history of suicidal gestures.  HPI Elements:   Location:  generalized. Quality:  acute. Severity:  severe. Timing:  constant. Duration:  two weeks. Context:  stressors.  Past Medical History:  Past Medical History  Diagnosis Date  . High cholesterol   . Depression   . Anxiety   . GERD (gastroesophageal reflux disease)   . Insomnia   . Arthritis   . Memory loss   . Emotional depression 02/04/2015    Past Surgical History  Procedure Laterality Date  . Cosmetic surgery    . Cesarean section    . Laproscopy     Family History:  Family History  Problem Relation Age of Onset  . Sleep apnea Father   . Alcohol abuse Father   . Diabetes Mother   . Diabetes Sister   . Diabetes Maternal Uncle   . Diabetes Cousin    Social History:  History  Alcohol Use No     History  Drug Use No    Comment: Denies  any drug use other than benzos    History   Social History  . Marital Status: Divorced    Spouse Name: N/A  . Number of Children: 3  . Years of Education: 14   Occupational History  . Retired    Social History Main Topics  . Smoking status: Never Smoker   . Smokeless tobacco: Never Used  . Alcohol Use: No  . Drug Use: No     Comment: Denies any drug use other than benzos  . Sexual Activity: No   Other Topics Concern  . None   Social History Narrative   Patient lives at home alone.   Caffeine Use:  2 cokes daily   Sister lives next door.   Additional Social History:                          Allergies:  No Known Allergies  Labs:  Results for orders placed or performed during the hospital encounter of 05/23/15 (from the past 48 hour(s))  Acetaminophen level     Status: Abnormal   Collection Time: 05/23/15  3:08 PM  Result Value Ref Range   Acetaminophen (Tylenol), Serum <10 (L) 10 - 30 ug/mL    Comment:        THERAPEUTIC CONCENTRATIONS VARY SIGNIFICANTLY. A RANGE OF 10-30 ug/mL MAY BE AN EFFECTIVE CONCENTRATION FOR MANY PATIENTS. HOWEVER, SOME ARE BEST TREATED AT CONCENTRATIONS OUTSIDE THIS RANGE. ACETAMINOPHEN CONCENTRATIONS >150 ug/mL AT 4 HOURS AFTER INGESTION AND >50 ug/mL AT 12 HOURS AFTER INGESTION ARE OFTEN ASSOCIATED WITH TOXIC REACTIONS.   CBC     Status: None   Collection Time: 05/23/15  3:08 PM  Result Value Ref Range   WBC 7.7 4.0 - 10.5 K/uL   RBC 4.47 3.87 - 5.11 MIL/uL   Hemoglobin 14.0 12.0 - 15.0 g/dL   HCT 41.0 36.0 - 46.0 %   MCV 91.7 78.0 - 100.0 fL   MCH 31.3 26.0 - 34.0 pg   MCHC 34.1 30.0 - 36.0 g/dL   RDW 14.4 11.5 - 15.5 %   Platelets 185 150 - 400 K/uL  Comprehensive metabolic panel     Status: Abnormal   Collection Time: 05/23/15  3:08 PM  Result Value Ref Range   Sodium 143 135 - 145 mmol/L   Potassium 2.8 (L) 3.5 - 5.1 mmol/L   Chloride 108 101 - 111 mmol/L   CO2 22 22 - 32 mmol/L   Glucose, Bld 140 (H) 65  - 99 mg/dL   BUN 13 6 - 20 mg/dL   Creatinine, Ser 0.84 0.44 - 1.00 mg/dL   Calcium 9.8 8.9 - 10.3 mg/dL   Total Protein 7.5 6.5 - 8.1 g/dL   Albumin 4.5 3.5 - 5.0 g/dL   AST 25 15 - 41 U/L   ALT 12 (L) 14 - 54 U/L   Alkaline Phosphatase 43 38 - 126 U/L   Total Bilirubin 0.6 0.3 -  1.2 mg/dL   GFR calc non Af Amer >60 >60 mL/min   GFR calc Af Amer >60 >60 mL/min    Comment: (NOTE) The eGFR has been calculated using the CKD EPI equation. This calculation has not been validated in all clinical situations. eGFR's persistently <60 mL/min signify possible Chronic Kidney Disease.    Anion gap 13 5 - 15  Ethanol (ETOH)     Status: None   Collection Time: 05/23/15  3:08 PM  Result Value Ref Range   Alcohol, Ethyl (B) <5 <5 mg/dL    Comment:        LOWEST DETECTABLE LIMIT FOR SERUM ALCOHOL IS 11 mg/dL FOR MEDICAL PURPOSES ONLY   Salicylate level     Status: None   Collection Time: 05/23/15  3:08 PM  Result Value Ref Range   Salicylate Lvl <4.0 2.8 - 30.0 mg/dL  Urine rapid drug screen (hosp performed)not at [facility]     Status: None   Collection Time: 05/23/15  4:57 PM  Result Value Ref Range   Opiates NONE DETECTED NONE DETECTED   Cocaine NONE DETECTED NONE DETECTED   Benzodiazepines NONE DETECTED NONE DETECTED   Amphetamines NONE DETECTED NONE DETECTED   Tetrahydrocannabinol NONE DETECTED NONE DETECTED   Barbiturates NONE DETECTED NONE DETECTED    Comment:        DRUG SCREEN FOR MEDICAL PURPOSES ONLY.  IF CONFIRMATION IS NEEDED FOR ANY PURPOSE, NOTIFY LAB WITHIN 5 DAYS.        LOWEST DETECTABLE LIMITS FOR URINE DRUG SCREEN Drug Class       Cutoff (ng/mL) Amphetamine      1000 Barbiturate      200 Benzodiazepine   200 Tricyclics       300 Opiates          300 Cocaine          300 THC              50   Potassium     Status: Abnormal   Collection Time: 05/24/15  6:24 AM  Result Value Ref Range   Potassium 3.0 (L) 3.5 - 5.1 mmol/L  Potassium     Status: Abnormal    Collection Time: 05/24/15 10:38 AM  Result Value Ref Range   Potassium 3.3 (L) 3.5 - 5.1 mmol/L    Vitals: Blood pressure 126/72, pulse 76, temperature 97.9 F (36.6 C), temperature source Oral, resp. rate 20, height 5' 4.5" (1.638 m), weight 66.679 kg (147 lb), SpO2 97 %.  Risk to Self: Is patient at risk for suicide?: Yes Risk to Others:   Prior Inpatient Therapy:   Prior Outpatient Therapy:    Current Facility-Administered Medications  Medication Dose Route Frequency Provider Last Rate Last Dose  . acetaminophen (TYLENOL) tablet 1,000 mg  1,000 mg Oral Q6H PRN Robert Lockwood, MD   1,000 mg at 05/24/15 0423  . clonazePAM (KLONOPIN) tablet 0.5 mg  0.5 mg Oral QHS Robert Lockwood, MD   0.5 mg at 05/23/15 2127  . memantine (NAMENDA) tablet 10 mg  10 mg Oral BID Robert Lockwood, MD   10 mg at 05/24/15 0917  . mirtazapine (REMERON SOL-TAB) disintegrating tablet 30 mg  30 mg Oral QHS Robert Lockwood, MD   30 mg at 05/23/15 2128  . OLANZapine zydis (ZYPREXA) disintegrating tablet 20 mg  20 mg Oral QHS Robert Lockwood, MD   20 mg at 05/23/15 2128  . pantoprazole (PROTONIX) EC tablet 40 mg  40 mg Oral Daily Robert   Lockwood, MD   40 mg at 05/24/15 0917  . pravastatin (PRAVACHOL) tablet 20 mg  20 mg Oral QHS Robert Lockwood, MD   20 mg at 05/23/15 2128  . Suvorexant TABS 10 mg  10 mg Oral QHS Robert Lockwood, MD   10 mg at 05/23/15 2129   Current Outpatient Prescriptions  Medication Sig Dispense Refill  . acetaminophen (TYLENOL) 500 MG tablet Take 1,000 mg by mouth every 6 (six) hours as needed for mild pain or headache.    . clonazePAM (KLONOPIN) 0.5 MG tablet Take 1 tablet (0.5 mg total) by mouth at bedtime. 30 tablet 2  . cyanocobalamin (,VITAMIN B-12,) 1000 MCG/ML injection Inject 1 mL (1,000 mcg total) into the skin once a week. For bone health 1 mL 0  . ibuprofen (ADVIL,MOTRIN) 200 MG tablet Take 800 mg by mouth every 6 (six) hours as needed.    . memantine (NAMENDA) 10 MG tablet Take 1  tablet (10 mg total) by mouth 2 (two) times daily. 60 tablet 12  . mirtazapine (REMERON SOL-TAB) 30 MG disintegrating tablet Take 1 tablet (30 mg total) by mouth at bedtime. For depression/insomnia 30 tablet 2  . OLANZapine zydis (ZYPREXA) 20 MG disintegrating tablet Take 1 tablet (20 mg total) by mouth at bedtime. For Insomnia, depression/mood control 30 tablet 2  . omeprazole (PRILOSEC) 20 MG capsule Take 1 capsule (20 mg total) by mouth daily. For acid reflux    . Suvorexant (BELSOMRA) 10 MG TABS Take 10 mg by mouth at bedtime. 30 tablet 2  . tamsulosin (FLOMAX) 0.4 MG CAPS capsule Take 1 capsule (0.4 mg total) by mouth daily after supper. For frequent urination/urgency 15 capsule 0  . nystatin (MYCOSTATIN/NYSTOP) 100000 UNIT/GM POWD Apply 1 application topically 2 (two) times daily.  2  . polyvinyl alcohol (LIQUIFILM TEARS) 1.4 % ophthalmic solution Place 1 drop into both eyes as needed for dry eyes. (Patient not taking: Reported on 03/22/2015) 15 mL 0  . pravastatin (PRAVACHOL) 20 MG tablet Take 1 tablet (20 mg total) by mouth at bedtime. For high cholesterol    . Vitamin D, Ergocalciferol, (DRISDOL) 50000 UNITS CAPS capsule Take 1 capsule (50,000 Units total) by mouth every 7 (seven) days. On Wednesday: For bone health (Patient not taking: Reported on 03/22/2015) 30 capsule     Musculoskeletal: Strength & Muscle Tone: within normal limits Gait & Station: unsteady Patient leans: N/A  Psychiatric Specialty Exam: Physical Exam  Review of Systems  Constitutional: Negative.   HENT: Negative.   Eyes: Negative.   Respiratory: Negative.   Cardiovascular: Negative.   Gastrointestinal: Negative.   Genitourinary: Negative.   Musculoskeletal: Negative.   Skin: Negative.   Neurological: Negative.   Endo/Heme/Allergies: Negative.   Psychiatric/Behavioral: Positive for depression, suicidal ideas and memory loss. The patient is nervous/anxious and has insomnia.     Blood pressure 126/72, pulse 76,  temperature 97.9 F (36.6 C), temperature source Oral, resp. rate 20, height 5' 4.5" (1.638 m), weight 66.679 kg (147 lb), SpO2 97 %.Body mass index is 24.85 kg/(m^2).  General Appearance: Casual  Eye Contact::  Good  Speech:  Normal Rate  Volume:  Normal  Mood:  Anxious and Depressed  Affect:  Congruent  Thought Process:  Rumination, racing thoughts, auditory hallucinations  Orientation:  Full (Time, Place, and Person)  Thought Content:  Rumination  Suicidal Thoughts:  Yes.  with intent/plan  Homicidal Thoughts:  No  Memory:  Immediate;   Fair Recent;   Fair Remote;   Fair    Judgement:  Impaired  Insight:  Fair  Psychomotor Activity:  Decreased  Concentration:  Fair  Recall:  Fair  Fund of Knowledge:Good  Language: Good  Akathisia:  No  Handed:  Right  AIMS (if indicated):     Assets:  Housing Leisure Time Resilience Social Support  ADL's:  Intact  Cognition: WNL  Sleep:      Medical Decision Making: Review of Psycho-Social Stressors (1), Review or order clinical lab tests (1) and Review of Medication Regimen & Side Effects (2)  Treatment Plan Summary: Daily contact with patient to assess and evaluate symptoms and progress in treatment, Medication management and Plan admit to [facility] for stabilization  Plan:  Recommend psychiatric Inpatient admission when medically cleared. Disposition: Admit  LORD, JAMISON, PMH-NP 05/24/2015 2:09 PM Patient seen face-to-face for psychiatric evaluation, chart reviewed and case discussed with the physician extender and developed treatment plan. Reviewed the information documented and agree with the treatment plan.  , MD 

## 2015-05-25 ENCOUNTER — Encounter (HOSPITAL_COMMUNITY): Payer: Self-pay | Admitting: Psychiatry

## 2015-05-25 DIAGNOSIS — F99 Mental disorder, not otherwise specified: Secondary | ICD-10-CM

## 2015-05-25 DIAGNOSIS — F410 Panic disorder [episodic paroxysmal anxiety] without agoraphobia: Secondary | ICD-10-CM

## 2015-05-25 DIAGNOSIS — G3184 Mild cognitive impairment, so stated: Secondary | ICD-10-CM

## 2015-05-25 DIAGNOSIS — F0390 Unspecified dementia without behavioral disturbance: Secondary | ICD-10-CM

## 2015-05-25 DIAGNOSIS — F333 Major depressive disorder, recurrent, severe with psychotic symptoms: Secondary | ICD-10-CM | POA: Diagnosis present

## 2015-05-25 DIAGNOSIS — R45851 Suicidal ideations: Secondary | ICD-10-CM

## 2015-05-25 DIAGNOSIS — G4700 Insomnia, unspecified: Secondary | ICD-10-CM

## 2015-05-25 LAB — MAGNESIUM: Magnesium: 2.1 mg/dL (ref 1.7–2.4)

## 2015-05-25 MED ORDER — MIRTAZAPINE 15 MG PO TBDP
15.0000 mg | ORAL_TABLET | Freq: Every day | ORAL | Status: DC
  Administered 2015-05-25: 15 mg via ORAL
  Filled 2015-05-25 (×2): qty 1

## 2015-05-25 MED ORDER — FLUVOXAMINE MALEATE 50 MG PO TABS
25.0000 mg | ORAL_TABLET | Freq: Every day | ORAL | Status: DC
  Administered 2015-05-25: 25 mg via ORAL
  Filled 2015-05-25 (×2): qty 1

## 2015-05-25 MED ORDER — DOCUSATE SODIUM 100 MG PO CAPS
100.0000 mg | ORAL_CAPSULE | Freq: Two times a day (BID) | ORAL | Status: DC
  Administered 2015-05-25 – 2015-05-26 (×3): 100 mg via ORAL
  Filled 2015-05-25 (×6): qty 1

## 2015-05-25 MED ORDER — ENSURE ENLIVE PO LIQD
237.0000 mL | Freq: Two times a day (BID) | ORAL | Status: DC
  Administered 2015-05-27 – 2015-06-02 (×9): 237 mL via ORAL

## 2015-05-25 MED ORDER — ESZOPICLONE 2 MG PO TABS
2.0000 mg | ORAL_TABLET | Freq: Every day | ORAL | Status: DC
Start: 2015-05-25 — End: 2015-05-27
  Administered 2015-05-25 – 2015-05-26 (×2): 2 mg via ORAL
  Filled 2015-05-25 (×2): qty 1

## 2015-05-25 MED ORDER — OLANZAPINE 5 MG PO TBDP
15.0000 mg | ORAL_TABLET | Freq: Every day | ORAL | Status: DC
  Administered 2015-05-25: 15 mg via ORAL
  Filled 2015-05-25 (×2): qty 3

## 2015-05-25 MED ORDER — POTASSIUM CHLORIDE CRYS ER 20 MEQ PO TBCR
20.0000 meq | EXTENDED_RELEASE_TABLET | Freq: Two times a day (BID) | ORAL | Status: AC
  Administered 2015-05-25: 20 meq via ORAL
  Filled 2015-05-25 (×3): qty 1

## 2015-05-25 MED ORDER — CLONAZEPAM 1 MG PO TABS
1.0000 mg | ORAL_TABLET | Freq: Every day | ORAL | Status: DC
  Administered 2015-05-25 – 2015-06-01 (×8): 1 mg via ORAL
  Filled 2015-05-25 (×8): qty 1

## 2015-05-25 MED ORDER — NON FORMULARY: 2.0000 mg | Freq: Every day | Status: DC

## 2015-05-25 NOTE — Tx Team (Signed)
Interdisciplinary Treatment Plan Update (Adult)  Date:  05/25/2015   Time Reviewed:  8:23 AM   Progress in Treatment: Attending groups: No Participating in groups:  No Taking medication as prescribed:  Yes. Tolerating medication:  Yes. Family/Significant other contact made:  No Patient understands diagnosis:  Yes  As evidenced by seeking help with  Lack of sleep, confusion, depression Discussing patient identified problems/goals with staff:  Yes, see initial care plan. Medical problems stabilized or resolved:  Yes. Denies suicidal/homicidal ideation: Yes. Issues/concerns per patient self-inventory:  No. Other:  New problem(s) identified:  Discharge Plan or Barriers: return home, follow up outpt  Reason for Continuation of Hospitalization: Depression Medication stabilization Other; describe sleep stabilization  Comments: Kendra Simmons is an 64 y.o. female who came to [facility] with her sister requesting inpatient hospitalization for depression, SI and hearing voices. She states that she spoke with her psychiatrist- Dr. Boykin Peek who suggested she come to [facility] for an assessment and possible placement inpatient. Pt states that she has not been sleeping in the past week and has been dealing with insomnia for the past year. She has a long history of depression and multiple hospitalizations for depression and insomnia- 2 hospitalizations at [facility] since last July. She states that she is not starting to hear voices that "sound like a low murmer or children playing outside". She reports that she lost her mom in March and now lives alone in her mom's house. She states that this has been stressful for her.  Klonopin, Lunesta, Luvox, Remeron, Zyprexa trial  Estimated length of stay:  4-5 days  New goal(s):  Review of initial/current patient goals per problem list:     Attendees: Patient:  05/25/2015 8:23 AM   Family:   05/25/2015 8:23 AM   Physician:  Ursula Alert, MD 05/25/2015  8:23 AM   Nursing:   Beverly Sessions, RN 05/25/2015 8:23 AM   CSW:    Roque Lias, LCSW   05/25/2015 8:23 AM   Other:  05/25/2015 8:23 AM   Other:   05/25/2015 8:23 AM   Other:  Lars Pinks, Nurse CM 05/25/2015 8:23 AM   Other:  Lucinda Dell, [facility] 05/25/2015 8:23 AM   Other:  Norberto Sorenson, [place]  05/25/2015 8:23 AM   Other:  05/25/2015 8:23 AM   Other:  05/25/2015 8:23 AM   Other:  05/25/2015 8:23 AM   Other:  05/25/2015 8:23 AM   Other:  05/25/2015 8:23 AM   Other:   05/25/2015 8:23 AM    Scribe for Treatment Team:   Trish Mage, 05/25/2015 8:23 AM

## 2015-05-25 NOTE — Progress Notes (Signed)
D: Pt denies SI/HI/AV. Pt is pleasant and cooperative. Pt rates depression at a 10, anxiety at a 10, and Helplessness/hopelessness at a 10.  A: Pt was offered support and encouragement. Pt was given scheduled medications. Pt was encourage to attend groups. Q 15 minute checks were done for safety.  R:Pt has not been attending groups and isolates to her room. Pt taking medication. Pt has no complaints.Pt at this time not receptive to treatment.  Safety maintained on unit.
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
## 2015-05-25 NOTE — Progress Notes (Signed)
NUTRITION ASSESSMENT  RD consulted for patient who may need a soft diet. Pt identified as at risk on the Malnutrition Screen Tool  INTERVENTION: 1. Recommend SLP eval if patient continues to have swallowing difficulties 2.. Educated patient on the importance of nutrition and encouraged intake of food and beverages. 3. Discussed weight goals. 4. Supplements: Ensure Enlive po BID, each supplement provides 350 kcal and 20 grams of protein   NUTRITION DIAGNOSIS: Unintentional weight loss related to sub-optimal intake as evidenced by pt report.   Goal: Pt to meet >/= 90% of their estimated nutrition needs.  Monitor:  PO intake  Assessment:  Pt admitted with depression and psychosis.  Pt reports 60 lb weight loss over the last year. UBW is 200 lb. Per weight history, pt has lost 19 lb since 1/8 (12% weight loss x 5 months, significant for time frame). Pt willing to try Ensure supplements, RD to order.  Pt reports trouble swallowing. Pt had grits and bacon this morning for breakfast and states she could not swallow the bacon. States that she feels like she is choking, mainly has problems with meats. Recommend swallow eval if patient continues swallowing function issues. Encouraged pt to chew foods thoroughly before swallowing.   Height: Ht Readings from Last 1 Encounters:  05/24/15 5' 4.5" (1.638 m)    Weight: Wt Readings from Last 1 Encounters:  05/24/15 141 lb (63.957 kg)    Weight Hx: Wt Readings from Last 10 Encounters:  05/24/15 141 lb (63.957 kg)  05/23/15 147 lb (66.679 kg)  03/22/15 147 lb 6.4 oz (66.86 kg)  03/10/15 145 lb (65.772 kg)  03/02/15 143 lb (64.864 kg)  02/22/15 146 lb (66.225 kg)  02/04/15 150 lb (68.04 kg)  02/01/15 153 lb 6.4 oz (69.582 kg)  12/28/14 159 lb 6.4 oz (72.303 kg)  12/24/14 160 lb 8 oz (72.802 kg)    BMI:  Body mass index is 23.84 kg/(m^2). Pt meets criteria for overweight based on current BMI.  Estimated Nutritional Needs: Kcal:  25-30 kcal/kg Protein: > 1 gram protein/kg Fluid: 1 ml/kcal  Diet Order: Diet Heart Room service appropriate?: Yes; Fluid consistency:: Thin Pt is also offered choice of unit snacks mid-morning and mid-afternoon.  Pt is eating as desired.   Lab results and medications reviewed.   Clayton Bibles, MS, RD, LDN Pager: [phone] After Hours Pager: [phone]

## 2015-05-25 NOTE — Progress Notes (Signed)
Pt refused her 1700 scheduled dose of Potassium.  She reports difficulty swallowing. This writer suggested/offered to crush this pill and put in applesauce/pudding/yogurt, etc. Pt continued to refuse.  This Probation officer extended the "due time" of this medication to 20:00 in an attempt to offer again.

## 2015-05-25 NOTE — Progress Notes (Signed)
Patient ID: Kendra Simmons, female   DOB: [birth year], 64 y.o.   MRN: [id number] PER STATE REGULATIONS 482.30  THIS CHART WAS REVIEWED FOR MEDICAL NECESSITY WITH RESPECT TO THE PATIENT'S ADMISSION/DURATION OF STAY.  NEXT REVIEW DATE: 05/28/15  Roma Schanz, RN, BSN CASE MANAGER

## 2015-05-25 NOTE — BHH Group Notes (Signed)
[facility] Mental Health Association Group Therapy  05/25/2015 , 2:14 PM    Type of Therapy:  Mental Health Association Presentation  Participation Level:  Invited.  Chose to not attend  Summary of Progress/Problems:  Shanon Brow from [place] came to present his recovery story and play the guitar.    Roque Lias B 05/25/2015 , 2:14 PM

## 2015-05-25 NOTE — Progress Notes (Signed)
Patient was seen for c/o difficulty swallowing.  There were no palpable masses noted.  Patient denied fevers, chills, weight loss, difficulty swallowing, weight loss or breathing.  Spoke with Dr Olen Pel, Hospitalist, discussed further recommendations.  There were no orders to give at this time.  Per Dr Olen Pel, to work up bulbar dysphagia, a high resolution MRI would be needed.  Will continue to monitor patient.

## 2015-05-25 NOTE — Progress Notes (Addendum)
D:Patient in the hallway on approach.  Patient blunted and depressed.  Patient does appear anxious as well.  Patient conversation fixed on medications for sleep.  Patient states she has not been sleeping and she states he is supposed to take [employer].  Patient states she has not been eating well but has taken snacks from staff tonight.  Patient passive SI, "I would rather be dead."  Patient denies HI and denies AVH.  Patient verbally contracts for safety.   A: Staff to monitor Q 15 mins for safety.  Encouragement and support offered.  Scheduled medications administered per orders. Tylenol administered prn for neck pain.   R: Patient remains safe on the unit.  Patient did not attend group tonight.  Patient visible on the unit but not interacting with peers.  Patient taking administered medications.

## 2015-05-25 NOTE — Progress Notes (Signed)
Pt. Refused an EKG today.  This writer explained the procedure, what to expect and why it is important.  The pt continued to refuse stating that she would consider this at a later time.

## 2015-05-25 NOTE — Progress Notes (Signed)
Recreation Therapy Notes  06.07.16 @ 1450 LRT met with patient about some relaxation techniques.  Patient stated she hasn't been sleeping.  LRT went over some deep breathing techniques and guided imagery to help patient relax.  Patient followed along for awhile but couldn't completely focus on the technique.  Patient was easily distracted by things like people in the hallway or the cracking sounds of the walls.  LRT will follow up with the patient in the morning.  [name], LRT/CTRS   [name] A 05/25/2015 3:34 PM

## 2015-05-26 ENCOUNTER — Ambulatory Visit (HOSPITAL_COMMUNITY): Payer: Self-pay | Admitting: Psychiatry

## 2015-05-26 LAB — BASIC METABOLIC PANEL
Anion gap: 8 (ref 5–15)
BUN: 21 mg/dL — AB (ref 6–20)
CO2: 25 mmol/L (ref 22–32)
CREATININE: 0.63 mg/dL (ref 0.44–1.00)
Calcium: 9.5 mg/dL (ref 8.9–10.3)
Chloride: 110 mmol/L (ref 101–111)
GFR calc Af Amer: >60 mL/min (ref >60)
Glucose, Bld: 127 mg/dL — ABNORMAL HIGH (ref 65–99)
Potassium: 3.5 mmol/L (ref 3.5–5.1)
Sodium: 143 mmol/L (ref 135–145)

## 2015-05-26 MED ORDER — MIRTAZAPINE 15 MG PO TBDP
15.0000 mg | ORAL_TABLET | Freq: Every day | ORAL | Status: DC
  Administered 2015-05-26 – 2015-05-31 (×6): 15 mg via ORAL
  Filled 2015-05-26 (×9): qty 1

## 2015-05-26 MED ORDER — FLUVOXAMINE MALEATE 50 MG PO TABS
50.0000 mg | ORAL_TABLET | Freq: Every day | ORAL | Status: DC
  Administered 2015-05-26 – 2015-05-28 (×2): 50 mg via ORAL
  Filled 2015-05-26 (×5): qty 1

## 2015-05-26 MED ORDER — TAMSULOSIN HCL 0.4 MG PO CAPS: 0.4000 mg | ORAL_CAPSULE | Freq: Every day | ORAL | Status: DC

## 2015-05-26 MED ORDER — CHLORPROMAZINE HCL 50 MG PO TABS
50.0000 mg | ORAL_TABLET | Freq: Every evening | ORAL | Status: DC | PRN
  Administered 2015-05-27 (×2): 50 mg via ORAL
  Filled 2015-05-26 (×4): qty 1

## 2015-05-26 MED ORDER — LOPERAMIDE HCL 2 MG PO CAPS: 2.0000 mg | ORAL_CAPSULE | ORAL | Status: DC | PRN

## 2015-05-26 MED ORDER — IBUPROFEN 400 MG PO TABS
400.0000 mg | ORAL_TABLET | Freq: Four times a day (QID) | ORAL | Status: DC | PRN
  Administered 2015-05-26 – 2015-06-01 (×11): 400 mg via ORAL
  Filled 2015-05-26 (×11): qty 1

## 2015-05-26 MED ORDER — MIRTAZAPINE 15 MG PO TBDP: 7.5000 mg | ORAL_TABLET | Freq: Every day | ORAL | Status: DC

## 2015-05-26 MED ORDER — TAMSULOSIN HCL 0.4 MG PO CAPS
0.4000 mg | ORAL_CAPSULE | Freq: Every day | ORAL | Status: DC
  Administered 2015-05-27 – 2015-06-01 (×6): 0.4 mg via ORAL
  Filled 2015-05-26 (×9): qty 1
  Filled 2015-05-26: qty 10
  Filled 2015-05-26 (×2): qty 1

## 2015-05-26 NOTE — Progress Notes (Signed)
Patient in bed resting while her sister visiting at the beginning of the shift. Her mood and affects sad and depressed. Patient reported that she had difficulty sleeping last night. She stated; " I haven't slept in 8 days". She denied SI/HI and denied Hallucinations. Patient has an order for EKG before administration of Thoraxin scheduled for bedtime. Patient refused EKG. She also requested for an order for Flomax. Patient checked patient's medication profile and saw that patient was on Flomax for urinary urgency at home. Writer notified Providence Lanius , NP on call. NP to order the medication. Writer encouraged patient to get out of bed and socialize with peers. Patient appeared sad, depressed and showed no interest in the assessment. Q 15 minute check continues as ordered to maintain safety.

## 2015-05-26 NOTE — Progress Notes (Signed)
Patient refused EKG this AM.  Writer explained the indication and patient states she does not care and does not want it done.

## 2015-05-26 NOTE — Progress Notes (Signed)
Psychoeducational Group Note  Date:  05/26/2015 Time:  2345  Group Topic/Focus:  Wrap-Up Group:   The focus of this group is to help patients review their daily goal of treatment and discuss progress on daily workbooks.  Participation Level: Did Not Attend  Participation Quality:  Not Applicable  Affect:  Not Applicable  Cognitive:  Not Applicable  Insight:  Not Applicable  Engagement in Group: Not Applicable  Additional Comments:  The patient did not attend this evening's Karaoke group and chose to remain in bed. She did get up to have her blood drawn and then returned to her room. She refused the night time snack and fluids as well.   Skyley Grandmaison S 05/26/2015, 11:45 PM

## 2015-05-26 NOTE — Progress Notes (Signed)
D: Pt denies SI/HI/AV when speaking to nurse during assessment but filled out on her worksheet that she was passive SI. When asked about SI again pt denied it. Pt is pleasant but withdrawn. Pt rates depression at a 10, anxiety at a 10, and Helplessness/hopelessness at a 10.  A: Pt was offered support and encouragement. Pt was given scheduled medications. Pt was encourage to attend groups. Q 15 minute checks were done for safety.  R:Pt attended part of groups and rarely  interacts with peers and staff. Pt taking medication. Pt has no complaints.Pt not receptive to treatment and safety maintained on unit.

## 2015-05-26 NOTE — BHH Group Notes (Signed)
[facility] Group Notes:  (Nursing/MHT/Case Management/Adjunct)  Date:  05/26/2015  Time:  0900  Type of Therapy:  Nurse Education  Participation Level:  None  Participation Quality:  Resistant  Affect:  Flat  Cognitive:  Oriented  Insight:  Lacking  Engagement in Group:  None  Modes of Intervention:  Discussion  Summary of Progress/Problems:  Catalina Gravel 05/26/2015, 6:53 PM

## 2015-05-26 NOTE — Progress Notes (Signed)
[facility] MD Progress Note  05/26/2015 2:53 PM Kendra Simmons  MRN:  [id number] Subjective:  Patient states " I am doing very poor. I did not sleep at all last night- dozed for 30 minutes . I am not feeling good, I would rather not be alive than to be like this.'  Objective:Patient seen and chart reviewed.Discussed patient with treatment team. Kendra Simmons is a 64 y.o. Caucasian female who lives by self in [place], she is divorced , has three adult children, is on SSD, has a hx of depression as well as insomnia and dementia .Patient presented to [facility] with her sister due to the above concerns . Per initial notes in EHR -pt spoke to Dr.Arfeen - her out patient psychiatrist who felt she should come in to the hospital.   Patient states today that she is still very depressed and wants to be dead , since she could not sleep at all again last night. Pt reports sleeping 30 minutes or so , even though the chart documentation per staff is that she slept 5.5 hrs.  Pt reports she feels nothing will ever work for her.  Pt also with c/o diarrhea today -states that she took colace yesterday -which could have caused it . Discussed stopping the colace. Pt denies any AH/VH today - reports some on and off mumbling in the past - last one was a week ago, denies ever seeing things that are not real. Pt today continues to be slow , but could not ellicit any impairment with her memory . She is on Namenda per neurology which seems to be helping. Pt does not have any stiffness, rigidity or tremors ( resting) indicative of parkinsonism features - however she is slow, and she looks older than her age - has a ?masked face ?/ flat affect.  Discussed readjusting her medications tonight to help with sleep.  Collateral information was obtained from [place] - her outpt provider - per Dr.Arfeen - pt has been dealing with severe chronic insomnia for years - has been tried on several medications and failed . Pt does sleep at night more  than what she states- she also has a problem with her perception of how much she sleeps - since she has been observed on the unit closely by staff in the past - inspite of previous documentation of how much better she has slept in the past - she always feels she does not sleep at all.     Principal Problem: MDD (major depressive disorder), recurrent, severe, with psychosis Diagnosis:   Patient Active Problem List   Diagnosis Date Noted  . MDD (major depressive disorder), recurrent, severe, with psychosis [F33.3] 05/25/2015  . 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 Snoring [R06.83] 12/24/2014  . Unintended weight loss [R63.4] 12/24/2014  . 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 Panic attacks [F41.0] 07/26/2014  . Thrombocytopenia [D69.6] 12/17/2012  . DYSLIPIDEMIA [E78.5] 03/13/2010  . GERD [K21.9] 03/13/2010   Total Time spent with patient: 30 minutes   Past Medical History:  Past Medical History  Diagnosis Date  . High cholesterol   . Depression   . Anxiety   . GERD (gastroesophageal reflux disease)   . Insomnia   . Arthritis   . Memory loss   . Emotional depression 02/04/2015    Past Surgical History  Procedure Laterality Date  . Cosmetic surgery    . Cesarean  section    . Laproscopy     Family History:  Family History  Problem Relation Age of Onset  . Sleep apnea Father   . Alcohol abuse Father   . Diabetes Mother   . Diabetes Sister   . Diabetes Maternal Uncle   . Diabetes Cousin    Social History:  History  Alcohol Use No    Comment: zero     History  Drug Use No    Comment: Denies any drug use other than benzos    History   Social History  . Marital Status: Divorced    Spouse Name: N/A  . Number of Children: 3  . Years of Education: 14   Occupational History  . Retired    Social History Main  Topics  . Smoking status: Never Smoker   . Smokeless tobacco: Never Used  . Alcohol Use: No     Comment: zero  . Drug Use: No     Comment: Denies any drug use other than benzos  . Sexual Activity: No   Other Topics Concern  . None   Social History Narrative   Patient lives at home alone.   Caffeine Use:  2 cokes daily   Sister lives next door.   Additional History:    Sleep: Poor  Appetite:  Poor    Musculoskeletal: Strength & Muscle Tone: within normal limits Gait & Station: normal Patient leans: N/A   Psychiatric Specialty Exam: Physical Exam  Review of Systems  Psychiatric/Behavioral: Positive for depression and suicidal ideas. The patient is nervous/anxious and has insomnia.   All other systems reviewed and are negative.   Blood pressure 99/71, pulse 163, temperature 97.8 F (36.6 C), temperature source Oral, resp. rate 16, height 5' 4.5" (1.638 m), weight 63.957 kg (141 lb).Body mass index is 23.84 kg/(m^2).  General Appearance: Disheveled  Eye Sport and exercise psychologist::  Fair  Speech:  Normal Rate  Volume:  Decreased  Mood:  Depressed  Affect:  Blunt  Thought Process:  Goal Directed  Orientation:  Full (Time, Place, and Person)  Thought Content:  Rumination  Suicidal Thoughts:  has death wish - feels like its not worth being alive if she has to deal with her problems of sleep  Homicidal Thoughts:  No  Memory:  Immediate;   Fair Recent;   Fair Remote;   Fair  Judgement:  Fair  Insight:  Fair  Psychomotor Activity:  Decreased  Concentration:  Poor  Recall:  [employer] of Knowledge:Fair  Language: Fair  Akathisia:  No  Handed:  Right  AIMS (if indicated):     Assets:  Communication Skills Desire for Improvement Social Support  ADL's:  Intact  Cognition: WNL  Sleep:  Number of Hours: 5.5     Current Medications: Current Facility-Administered Medications  Medication Dose Route Frequency Provider Last Rate Last Dose  . alum & mag hydroxide-simeth  (MAALOX/MYLANTA) 200-200-20 MG/5ML suspension 30 mL  30 mL Oral Q4H PRN Patrecia Pour, NP      . chlorproMAZINE (THORAZINE) tablet 50 mg  50 mg Oral QHS,MR X 1 Jamorian Dimaria, MD      . clonazePAM (KLONOPIN) tablet 1 mg  1 mg Oral QHS Abbegail Matuska, MD   1 mg at 05/25/15 2127  . eszopiclone (LUNESTA) tablet 2 mg  2 mg Oral QHS Ursula Alert, MD   2 mg at 05/25/15 2127  . feeding supplement (ENSURE ENLIVE) (ENSURE ENLIVE) liquid 237 mL  237 mL Oral BID BM Mendel Ryder  Baker, RD   237 mL at 05/25/15 1500  . fluvoxaMINE (LUVOX) tablet 50 mg  50 mg Oral QHS Shelene Krage, MD      . ibuprofen (ADVIL,MOTRIN) tablet 400 mg  400 mg Oral Q6H PRN Ursula Alert, MD      . loperamide (IMODIUM) capsule 2 mg  2 mg Oral PRN Kerrie Buffalo, NP      . magnesium hydroxide (MILK OF MAGNESIA) suspension 30 mL  30 mL Oral Daily PRN Patrecia Pour, NP      . memantine [facility]) tablet 10 mg  10 mg Oral BID Patrecia Pour, NP   10 mg at 05/26/15 1478  . mirtazapine (REMERON SOL-TAB) disintegrating tablet 15 mg  15 mg Oral QHS Shivansh Hardaway, MD      . pantoprazole (PROTONIX) EC tablet 40 mg  40 mg Oral Daily Patrecia Pour, NP   40 mg at 05/26/15 2956  . pravastatin (PRAVACHOL) tablet 20 mg  20 mg Oral QHS Patrecia Pour, NP   20 mg at 05/25/15 2129    Lab Results:  Results for orders placed or performed during the hospital encounter of 05/24/15 (from the past 48 hour(s))  Magnesium     Status: None   Collection Time: 05/25/15  7:34 PM  Result Value Ref Range   Magnesium 2.1 1.7 - 2.4 mg/dL    Comment: Performed at [facility]    Physical Findings: AIMS: Facial and Oral Movements Muscles of Facial Expression: None, normal Lips and Perioral Area: None, normal Jaw: None, normal Tongue: None, normal,Extremity Movements Upper (arms, wrists, hands, fingers): None, normal Lower (legs, knees, ankles, toes): None, normal, Trunk Movements Neck, shoulders, hips: None, normal, Overall  Severity Severity of abnormal movements (highest score from questions above): None, normal Incapacitation due to abnormal movements: None, normal Patient's awareness of abnormal movements (rate only patient's report): No Awareness, Dental Status Current problems with teeth and/or dentures?: No Does patient usually wear dentures?: No  CIWA:  CIWA-Ar Total: 2 COWS:  COWS Total Score: 3   Assessment: Patient is a 2 y old CF who has a hx of depression as well as chronic severe insomnia , dementia ( Alzheimer's versus lewy body ), has also some possible parkinsonian sx - like bradykinesia , masked face ( versus blunt affect) - pt continues to have sleep issues- will continue treatment.    Treatment Plan Summary: Daily contact with patient to assess and evaluate symptoms and progress in treatment and Medication management  Will continue Remeron 15 mg po qhs - to help with sleep .Remeron is more sedating at lower doses. Will increase Luvox to 50 mg po qhs for depression. Will continue Klonopin 1 mg po qhs for anxiety sx. Will continue Lunesta 2 mg po qhs for sleep. Will discontinue Zyprexa for lack of efficacy . Will start a trial of Thorazine 50 mg po qhs , repeat x1 prn for AH, as well as sleep.Would be cautious considering her age as well as her dementia ( with ? lewy body dementia ) - she does not have AH all the time . Will monitor. AIMS -0 ( 05/26/15).  Patient ot be observed on the unit for worsening sleep issues as well as suicidal ideation.  Patient with hypokalemia - will order Kdur 26meqx2 doses- however pt has been refusing to take the [facility].  Will continue Namenda 10 mg bid po for dementia.  Will continue Pravachol 20 mg po daily for hyperlipidemia.  Will continue  Protonix 40 mg po daily for GERD.  Will Discontinue Colace 100 mg po bid - since pt has diarrhea today.  Will continue to monitor vitals ,medication compliance and treatment side effects while patient is here. Patient  with difficulty swallowing- NP may to discuss this with hospitalist.  Reviewed labs ,Mg - reviewed- wnl . Will reorder BMP tonight.  CSW will start working on disposition.   Patient to participate in therapeutic milieu .    Medical Decision Making:  Review of Psycho-Social Stressors (1), Review or order clinical lab tests (1), Review and summation of old records (2), Established Problem, Worsening (2), Review of Last Therapy Session (1), Review of Medication Regimen & Side Effects (2) and Review of New Medication or Change in Dosage (2)     Jaydence Arnesen MD 05/26/2015, 2:53 PM

## 2015-05-26 NOTE — BHH Suicide Risk Assessment (Signed)
[facility] [place] INPATIENT:  Family/Significant Other Suicide Prevention Education  Suicide Prevention Education:  Education Completed;Jean [place], Sister, [phone];  has been identified by the patient as the family member/significant other with whom the patient will be residing, and identified as the person(s) who will aid the patient in the event of a mental health crisis (suicidal ideations/suicide attempt).  With written consent from the patient, the family member/significant other has been provided the following suicide prevention education, prior to the and/or following the discharge of the patient.  The suicide prevention education provided includes the following:  Suicide risk factors  Suicide prevention and interventions  National Suicide Hotline telephone number  [facility] assessment telephone number  [facility] Emergency Assistance [place] and/or Residential Mobile Crisis Unit telephone number  Request made of family/significant other to:  Remove weapons (e.g., guns, rifles, knives), all items previously/currently identified as safety concern.    Remove drugs/medications (over-the-counter, prescriptions, illicit drugs), all items previously/currently identified as a safety concern.  The family member/significant other verbalizes understanding of the suicide prevention education information provided.  The family member/significant other agrees to remove the items of safety concern listed above.  Roque Lias B 05/26/2015, 5:39 PM

## 2015-05-26 NOTE — BHH Group Notes (Signed)
[facility] Group Notes:  (Counselor/Nursing/MHT/Case Management/Adjunct)  05/26/2015 1:15PM  Type of Therapy:  Group Therapy  Participation Level: Invited  Chose to not come.  "I have diarrhea"  Summary of Progress/Problems: The topic for group was balance in life.  Pt participated in the discussion about when their life was in balance and out of balance and how this feels.  Pt discussed ways to get back in balance and short term goals they can work on to get where they want to be.    Trish Mage 05/26/2015 5:26 PM

## 2015-05-26 NOTE — Progress Notes (Signed)
Patient refused EKG tonight will try again in the AM

## 2015-05-26 NOTE — Progress Notes (Signed)
Recreation Therapy Notes  06.09.16 @ 1240 pm LRT followed up with patient about relaxation techniques.  Patient stated she still didn't sleep last night.  Patient stated she tried deep breathing and the visualization but it didn't help.  Patient stated "nothing is working".  Patient expressed the only thing that will work is medication.  LRT explained that these techniques work in conjunction with medications but the patient wasn't to receptive to that.  LRT will continue to work with patient on relaxation techniques.    [name], LRT/CTRS    Ria Comment, Rhylie Stehr A 05/26/2015 1:28 PM

## 2015-05-27 ENCOUNTER — Encounter (HOSPITAL_COMMUNITY): Payer: Self-pay | Admitting: Emergency Medicine

## 2015-05-27 DIAGNOSIS — R000 Tachycardia, unspecified: Secondary | ICD-10-CM | POA: Clinically undetermined

## 2015-05-27 LAB — I-STAT CHEM 8, ED
BUN: 15 mg/dL (ref 6–20)
Calcium, Ion: 1.2 mmol/L (ref 1.13–1.30)
Chloride: 107 mmol/L (ref 101–111)
Creatinine, Ser: 0.6 mg/dL (ref 0.44–1.00)
GLUCOSE: 123 mg/dL — AB (ref 65–99)
HEMATOCRIT: 34 % — AB (ref 36.0–46.0)
Hemoglobin: 11.6 g/dL — ABNORMAL LOW (ref 12.0–15.0)
POTASSIUM: 2.9 mmol/L — AB (ref 3.5–5.1)
Sodium: 144 mmol/L (ref 135–145)
TCO2: 18 mmol/L (ref 0–100)

## 2015-05-27 LAB — PROLACTIN: Prolactin: 50.3 ng/mL — ABNORMAL HIGH (ref 4.8–23.3)

## 2015-05-27 MED ORDER — METOPROLOL TARTRATE 12.5 MG HALF TABLET
12.5000 mg | ORAL_TABLET | Freq: Two times a day (BID) | ORAL | Status: DC
  Administered 2015-05-28 – 2015-05-29 (×3): 12.5 mg via ORAL
  Filled 2015-05-27 (×7): qty 1

## 2015-05-27 MED ORDER — SODIUM CHLORIDE 0.9 % IV BOLUS (SEPSIS)
1000.0000 mL | Freq: Once | INTRAVENOUS | Status: DC
Start: 2015-05-27 — End: 2015-05-28
  Filled 2015-05-27: qty 1000

## 2015-05-27 MED ORDER — OLANZAPINE 10 MG PO TBDP
20.0000 mg | ORAL_TABLET | Freq: Every day | ORAL | Status: DC
  Administered 2015-05-28 – 2015-05-29 (×3): 20 mg via ORAL
  Filled 2015-05-27 (×8): qty 2

## 2015-05-27 MED ORDER — SODIUM CHLORIDE 0.9 % IV BOLUS (SEPSIS)
1000.0000 mL | Freq: Once | INTRAVENOUS | Status: AC
  Administered 2015-05-27: 1000 mL via INTRAVENOUS
  Filled 2015-05-27: qty 1000

## 2015-05-27 MED ORDER — POTASSIUM CHLORIDE 20 MEQ/15ML (10%) PO SOLN: 40.0000 meq | Freq: Once | ORAL | Status: DC

## 2015-05-27 MED ORDER — POTASSIUM CHLORIDE 20 MEQ/15ML (10%) PO SOLN
40.0000 meq | Freq: Once | ORAL | Status: AC
  Administered 2015-05-27: 40 meq via ORAL
  Filled 2015-05-27: qty 30

## 2015-05-27 MED ORDER — SODIUM CHLORIDE 0.9 % IV BOLUS (SEPSIS): 1000.0000 mL | Freq: Once | INTRAVENOUS | Status: DC

## 2015-05-27 MED ORDER — TRIAZOLAM 0.125 MG PO TABS
0.1250 mg | ORAL_TABLET | Freq: Every day | ORAL | Status: DC
  Administered 2015-05-27 – 2015-05-31 (×5): 0.125 mg via ORAL
  Filled 2015-05-27 (×5): qty 1

## 2015-05-27 MED ORDER — CLONAZEPAM 0.5 MG PO TABS
0.5000 mg | ORAL_TABLET | Freq: Once | ORAL | Status: AC
Start: 2015-05-27 — End: 2015-05-27
  Administered 2015-05-27: 0.5 mg via ORAL
  Filled 2015-05-27: qty 1

## 2015-05-27 NOTE — Progress Notes (Signed)
Talked to NP, and he insisted that pt go tonight to get fluids. Asked if pt could go tomorrow and get the ECHO at the same time and he said no.   Called ER , talked to charge nurse and informed her that pt was coming over.

## 2015-05-27 NOTE — Progress Notes (Signed)
Kendra Simmons is convinced first thing this morning that she is " having some kind of reaction" to the  Thorazine she took at midnight and she is unable to process with this Probation officer that she is probably not experiencing this. She says " I can't move..I can't raise my arms...they are so heavy". She avoids eye contact. She remains somatically focused. [place] that she is " not going to an assisted living". A SHe demonstrates no insight or willingness to engage in helping staff work on her placement...discuss her progress, or lack thereof.Marland KitchenMarland KitchenShe is focused on " I can't believe I still haven't slept in 9 days"...despite staff documenting they saw pt sleep last night and pt is currently sleeping in her bed as this note is typed. Pt is discussed in treatment team and SW to encourage to work on possible dc to [facility] facility in the area. R Safety is in place.

## 2015-05-27 NOTE — ED Notes (Signed)
Pt sent from [facility] for 1L fluid per [facility] consult for TC.

## 2015-05-27 NOTE — ED Notes (Signed)
Pt ready to be discharged back to [facility]---- [facility] Floor Unit RN was called and notified of pt's discharge, reports on pt's condition given.

## 2015-05-27 NOTE — Progress Notes (Signed)
Recreation Therapy Notes  06.10.16 @ 945 am LRT went to meet with patient but patient was asleep.  LRT will follow up later in the afternoon.  [name], LRT/CTRS     06.10.16 @ 1245 pm  LRT met with patient about relaxing.  Patient stated she wasn't feeling well and continues to say she isn't sleeping.     [name], LRT/CTRS  [name] A 05/27/2015 2:56 PM

## 2015-05-27 NOTE — ED Notes (Signed)
Kendra Simmons was called for pt's transportation back to [facility].

## 2015-05-27 NOTE — Progress Notes (Addendum)
[facility] MD Progress Note  05/27/2015 2:13 PM Kendra Simmons  MRN:  [id number] Subjective:  Patient states " I am in this state , since I started the thorazine.'    Objective:Patient seen and chart reviewed.Discussed patient with treatment team. Kendra Simmons is a 64 y.o. Caucasian female who lives by self in [place], she is divorced , has three adult children, is on SSD, has a hx of depression as well as insomnia and dementia .Patient presented to [facility] with her sister due to the above concerns . Per initial notes in EHR -pt spoke to Dr.Arfeen - her out patient psychiatrist who felt she should come in to the hospital.    Patient seen today - was in the hallway - she suddenly stopped , appeared to be dizzy, she was about to fall down , but was held by her nurse Patty. Pt had VS done -which showed BP - 92/67 , pulse - 125. Pt has been tachycardic even before she was on thorazine - unlikely due to thorazine.Thorazine was just started last night. Pt even before this was slow, tachycardic on and off, has a masked face versus blunt affect.   Patient answered questions appropriately soon after this - just kept saying " this is what thorazine did to me.' Pt states being confused and heavy. Per nursing staff- she also reported not being able to move her body - because it felt so heavy .   Pt was alert , soon after this episode - oriented x3 .   Pt was seen as sleeping in her room whole morning - atleast for 3 hrs . Pt continues to state that she did not sleep last night- however sleep documented as 5 hrs. Staff was asked to closely monitor her sleep , since she continues to report sleep problems - has failed several trials of medications .   Per Dr.Arfeen - her out patient provider - pt has problem with sleep perception-and that she has been tried on several medications - nothing seems to work for her.         Principal Problem: MDD (major depressive disorder), recurrent, severe, with  psychosis Diagnosis:   Patient Active Problem List   Diagnosis Date Noted  . Tachycardia [R00.0] 05/27/2015  . MDD (major depressive disorder), recurrent, severe, with psychosis [F33.3] 05/25/2015  . 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 Snoring [R06.83] 12/24/2014  . Unintended weight loss [R63.4] 12/24/2014  . 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 Panic attacks [F41.0] 07/26/2014  . Thrombocytopenia [D69.6] 12/17/2012  . DYSLIPIDEMIA [E78.5] 03/13/2010  . GERD [K21.9] 03/13/2010   Total Time spent with patient: 30 minutes   Past Medical History:  Past Medical History  Diagnosis Date  . High cholesterol   . Depression   . Anxiety   . GERD (gastroesophageal reflux disease)   . Insomnia   . Arthritis   . Memory loss   . Emotional depression 02/04/2015    Past Surgical History  Procedure Laterality Date  . Cosmetic surgery    . Cesarean section    . Laproscopy     Family History:  Family History  Problem Relation Age of Onset  . Sleep apnea Father   . Alcohol abuse Father   . Diabetes Mother   . Diabetes Sister   . Diabetes Maternal Uncle   . Diabetes Cousin    Social History:  History  Alcohol Use No    Comment: zero     History  Drug Use No    Comment: Denies any drug use other than benzos    History   Social History  . Marital Status: Divorced    Spouse Name: N/A  . Number of Children: 3  . Years of Education: 14   Occupational History  . Retired    Social History Main Topics  . Smoking status: Never Smoker   . Smokeless tobacco: Never Used  . Alcohol Use: No     Comment: zero  . Drug Use: No     Comment: Denies any drug use other than benzos  . Sexual Activity: No   Other Topics Concern  . None   Social History Narrative   Patient lives at home alone.   Caffeine Use:  2 cokes daily    Sister lives next door.   Additional History:    Sleep: Poor  Appetite:  Poor    Musculoskeletal: Strength & Muscle Tone: within normal limits Gait & Station: normal Patient leans: N/A   Psychiatric Specialty Exam: Physical Exam  Review of Systems  Psychiatric/Behavioral: Positive for depression and suicidal ideas. The patient is nervous/anxious and has insomnia.   All other systems reviewed and are negative.   Blood pressure 113/67, pulse 92, temperature 97.5 F (36.4 C), temperature source Oral, resp. rate 16, height 5' 4.5" (1.638 m), weight 63.957 kg (141 lb).Body mass index is 23.84 kg/(m^2).  General Appearance: Disheveled  Eye Sport and exercise psychologist::  Fair  Speech:  Normal Rate  Volume:  Decreased  Mood:  Depressed  Affect:  Blunt  Thought Process:  Goal Directed  Orientation:  Full (Time, Place, and Person)  Thought Content:  Rumination  Suicidal Thoughts:  has death wish - feels like its not worth being alive if she has to deal with her problems of sleep  Homicidal Thoughts:  No  Memory:  Immediate;   Fair Recent;   Fair Remote;   Fair  Judgement:  Fair  Insight:  Fair  Psychomotor Activity:  Decreased  Concentration:  Poor  Recall:  [employer] of Knowledge:Fair  Language: Fair  Akathisia:  No  Handed:  Right  AIMS (if indicated):     Assets:  Communication Skills Desire for Improvement Social Support  ADL's:  Intact  Cognition: WNL  Sleep:  Number of Hours: 5     Current Medications: Current Facility-Administered Medications  Medication Dose Route Frequency Provider Last Rate Last Dose  . alum & mag hydroxide-simeth (MAALOX/MYLANTA) 200-200-20 MG/5ML suspension 30 mL  30 mL Oral Q4H PRN Patrecia Pour, NP      . clonazePAM Bobbye Charleston) tablet 1 mg  1 mg Oral QHS Ursula Alert, MD   1 mg at 05/26/15 2106  . feeding supplement (ENSURE ENLIVE) (ENSURE ENLIVE) liquid 237 mL  237 mL Oral BID BM Clayton Bibles, RD   237 mL at 05/25/15 1500  . fluvoxaMINE (LUVOX)  tablet 50 mg  50 mg Oral QHS Ursula Alert, MD   50 mg at 05/26/15 2107  . ibuprofen (ADVIL,MOTRIN) tablet 400 mg  400 mg Oral Q6H PRN Ursula Alert, MD   400 mg at 05/27/15 1245  . loperamide (IMODIUM) capsule 2 mg  2 mg Oral PRN Kerrie Buffalo, NP      . magnesium hydroxide (MILK OF MAGNESIA) suspension 30 mL  30 mL Oral Daily PRN Patrecia Pour, NP      . memantine [facility]) tablet 10 mg  10 mg Oral BID Patrecia Pour, NP   10 mg at 05/27/15 2956  . mirtazapine (REMERON SOL-TAB) disintegrating tablet 15 mg  15 mg Oral QHS Ursula Alert, MD   15 mg at 05/26/15 2107  . OLANZapine zydis (ZYPREXA) disintegrating tablet 20 mg  20 mg Oral QHS Mckensey Berghuis, MD      . pantoprazole (PROTONIX) EC tablet 40 mg  40 mg Oral Daily Patrecia Pour, NP   40 mg at 05/27/15 2130  . pravastatin (PRAVACHOL) tablet 20 mg  20 mg Oral QHS Patrecia Pour, NP   20 mg at 05/26/15 2107  . tamsulosin (FLOMAX) capsule 0.4 mg  0.4 mg Oral QPC supper Ursula Alert, MD   0.4 mg at 05/27/15 0022  . triazolam (HALCION) tablet 0.125 mg  0.125 mg Oral QHS Ursula Alert, MD        Lab Results:  Results for orders placed or performed during the hospital encounter of 05/24/15 (from the past 48 hour(s))  Magnesium     Status: None   Collection Time: 05/25/15  7:34 PM  Result Value Ref Range   Magnesium 2.1 1.7 - 2.4 mg/dL    Comment: Performed at [facility]  Prolactin     Status: Abnormal   Collection Time: 05/25/15  7:34 PM  Result Value Ref Range   Prolactin 50.3 (H) 4.8 - 23.3 ng/mL    Comment: (NOTE) Performed At: [facility] [place], [place] [id number] Lindon Romp MD EX:[id number] Performed at [place] metabolic panel     Status: Abnormal   Collection Time: 05/26/15  8:35 PM  Result Value Ref Range   Sodium 143 135 - 145 mmol/L   Potassium 3.5 3.5 - 5.1 mmol/L   Chloride 110 101 - 111 mmol/L   CO2 25 22 - 32 mmol/L   Glucose,  Bld 127 (H) 65 - 99 mg/dL   BUN 21 (H) 6 - 20 mg/dL   Creatinine, Ser 0.63 0.44 - 1.00 mg/dL   Calcium 9.5 8.9 - 10.3 mg/dL   GFR calc non Af Amer >60 >60 mL/min   GFR calc Af Amer >60 >60 mL/min    Comment: (NOTE) The eGFR has been calculated using the CKD EPI equation. This calculation has not been validated in all clinical situations. eGFR's persistently <60 mL/min signify possible Chronic Kidney Disease.    Anion gap 8 5 - 15    Comment: Performed at [facility]    Physical Findings: AIMS: Facial and Oral Movements Muscles of Facial Expression: None, normal Lips and Perioral Area: None, normal Jaw: None, normal Tongue: None, normal,Extremity Movements Upper (arms, wrists, hands, fingers): None, normal Lower (legs, knees, ankles, toes): None, normal, Trunk Movements Neck, shoulders, hips: None, normal, Overall Severity Severity of abnormal movements (highest score from questions above): None, normal Incapacitation due to abnormal movements: None, normal Patient's awareness of abnormal movements (rate only patient's report): No Awareness, Dental Status Current problems with teeth and/or dentures?: No Does patient usually wear dentures?: No  CIWA:  CIWA-Ar Total: 2 COWS:  COWS Total Score: 3   05/26/15 Collateral information was obtained from [place] - her outpt provider - per Dr.Arfeen - pt has been dealing with severe chronic insomnia for years - has been tried on several medications and failed . Pt does sleep at night more than what she states- she also has a problem with her perception of how much she  sleeps - since she has been observed on the unit closely by staff in the past - inspite of previous documentation of how much better she has slept in the past - she always feels she does not sleep at all.     Assessment: Patient is a 57 y old CF who has a hx of depression as well as chronic severe insomnia , dementia ( Alzheimer's versus lewy body ), has  also some possible parkinsonian sx - like bradykinesia , masked face ( versus blunt affect) - pt continues to have sleep issues- will continue treatment.    Treatment Plan Summary: Daily contact with patient to assess and evaluate symptoms and progress in treatment and Medication management  Will continue Remeron 15 mg po qhs - to help with sleep .Remeron is more sedating at lower doses. Will continue Luvox 50 mg po qhs for depression. Will continue Klonopin 1 mg po qhs for anxiety sx. Will discontinue Lunesta for lack of efficacy. Will start Halcion 0.125 mg po qhs for sleep. Pt has never been tried on it in the past.  Will restart Zyprexa 20 mg po qhs - her home dose . Pt had a bad response to Thorazine - feels it makes her heavy , confused- . Will DC Thorazine.AIMS - 0 ( 05/27/15)  Patient to be observed on the unit for worsening sleep issues as well as suicidal ideation.  Patient's hypokalemia resolved - per BMP reviewed - wnl.  Will continue Namenda 10 mg bid po for dementia.  Will continue Pravachol 20 mg po daily for hyperlipidemia.  Will continue Protonix 40 mg po daily for GERD  Will continue to monitor vitals ,medication compliance and treatment side effects while patient is here. Patient with tachycardia - Patient has had tachycardia on and off since the past few days - even before she was on thorazine, thorazine was just given last night for the first time. NP May Sheila  to discuss this with hospitalist.Will get EKG stat.  CSW will start working on disposition. Patient to be referred to ALF on discharge.  Patient to participate in therapeutic milieu .    Medical Decision Making:  Review of Psycho-Social Stressors (1), Review or order clinical lab tests (1), Review and summation of old records (2), Established Problem, Worsening (2), Review of Last Therapy Session (1), Review of Medication Regimen & Side Effects (2) and Review of New Medication or Change in Dosage  (2)     Mora Pedraza MD 05/27/2015, 2:13 PM

## 2015-05-27 NOTE — [facility] Group Notes (Signed)
[facility] Group Notes:  (Counselor/Nursing/MHT/Case Management/Adjunct)  05/27/2015 1:15PM  Type of Therapy:  Group Therapy  Participation Level:  Invited  Chose to not attend    Summary of Progress/Problems: The topic for group was balance in life.  Pt participated in the discussion about when their life was in balance and out of balance and how this feels.  Pt discussed ways to get back in balance and short term goals they can work on to get where they want to be.    Roque Lias B 05/27/2015 1:05 PM

## 2015-05-27 NOTE — [facility] Group Notes (Signed)
[facility] LCSW Aftercare Discharge Planning Group Note   05/27/2015 1:00 PM  Participation Quality:  Invited.. Somatic complaints.  Pt signed release for [facility] last night with sister's insistence.  Application submitted today.    Kendra Simmons

## 2015-05-27 NOTE — Consult Note (Signed)
[facility] Medical Consultation  Kendra Simmons:[id number] DOB: 05-20-1951 DOA: 05/24/2015 PCP: [name], MD   Requesting physician: EDP Date of consultation: 05/27/15 Reason for consultation: tachycardia.  Impression/Recommendations Principal Problem:   MDD (major depressive disorder), recurrent, severe, with psychosis Active Problems:   Panic attacks   Insomnia disorder, with non-sleep disorder mental comorbidity, recurrent   Mild neurocognitive disorder   Tachycardia    1. Asymptomatic Tachycardia: - get 12 LEAD EKG.  - differential include dehydration vs benzo withdrawal vs secondary to medications.  - fluid bolus of NS 1 liter.  - Echocardiogram.   2. Depression/ insomnia: further management as per psychiatry.   I will followup again tomorrow. Please contact me if I can be of assistance in the meanwhile. Thank you for this consultation.  Chief Complaint: fast breathing/ feeling miserable. Kendra Simmons   HPI:  64 year old lady with prior h/o depression, anxiety, insomnia, GERD, arthritis, was brought in by her sister for symptoms of insomnia, and depression. She was admitted to [facility] for the above symptoms. During her evaluation she was found to heave intermittent tachycardia. She denies any chest pain, sob or palpitations. She reports tachypnea. She denies any abdominal pain, nausea, vomiting or diarrhea. She reports insomnia an rests only after taking klonipin. RN reports minimal po intake.   Review of Systems:  See HPI for positive ROS,  Otherwise negative.   Past Medical History  Diagnosis Date  . High cholesterol   . Depression   . Anxiety   . GERD (gastroesophageal reflux disease)   . Insomnia   . Arthritis   . Memory loss   . Emotional depression 02/04/2015   Past Surgical History  Procedure Laterality Date  . Cosmetic surgery    . Cesarean section    . Laproscopy     Social History:  reports that she has never smoked. She has never used smokeless  tobacco. She reports that she does not drink alcohol or use illicit drugs.  No Known Allergies Family History  Problem Relation Age of Onset  . Sleep apnea Father   . Alcohol abuse Father   . Diabetes Mother   . Diabetes Sister   . Diabetes Maternal Uncle   . Diabetes Cousin     Prior to Admission medications   Medication Sig Start Date End Date Taking? Authorizing Provider  acetaminophen (TYLENOL) 500 MG tablet Take 1,000 mg by mouth every 6 (six) hours as needed for mild pain or headache.    Historical Provider, MD  clonazePAM (KLONOPIN) 0.5 MG tablet Take 1 tablet (0.5 mg total) by mouth at bedtime. 03/22/15   [name], MD  cyanocobalamin (,VITAMIN B-12,) 1000 MCG/ML injection Inject 1 mL (1,000 mcg total) into the skin once a week. For bone health 12/20/14   Encarnacion Slates, NP  ibuprofen (ADVIL,MOTRIN) 200 MG tablet Take 800 mg by mouth every 6 (six) hours as needed.    Historical Provider, MD  memantine (NAMENDA) 10 MG tablet Take 1 tablet (10 mg total) by mouth 2 (two) times daily. 03/10/15   Penni Bombard, MD  mirtazapine (REMERON SOL-TAB) 30 MG disintegrating tablet Take 1 tablet (30 mg total) by mouth at bedtime. For depression/insomnia 03/22/15   [name], MD  nystatin (MYCOSTATIN/NYSTOP) 100000 UNIT/GM POWD Apply 1 application topically 2 (two) times daily. 03/28/15   Historical Provider, MD  OLANZapine zydis (ZYPREXA) 20 MG disintegrating tablet Take 1 tablet (20 mg total) by mouth at bedtime. For Insomnia, depression/mood  control 03/22/15   [name], MD  omeprazole (PRILOSEC) 20 MG capsule Take 1 capsule (20 mg total) by mouth daily. For acid reflux 12/20/14   Encarnacion Slates, NP  polyvinyl alcohol (LIQUIFILM TEARS) 1.4 % ophthalmic solution Place 1 drop into both eyes as needed for dry eyes. Patient not taking: Reported on 03/22/2015 12/20/14   Encarnacion Slates, NP  pravastatin (PRAVACHOL) 20 MG tablet Take 1 tablet (20 mg total) by mouth at bedtime. For high cholesterol 12/20/14    Encarnacion Slates, NP  Suvorexant (BELSOMRA) 10 MG TABS Take 10 mg by mouth at bedtime. 03/22/15   [name], MD  tamsulosin (FLOMAX) 0.4 MG CAPS capsule Take 1 capsule (0.4 mg total) by mouth daily after supper. For frequent urination/urgency 12/20/14   Encarnacion Slates, NP  Vitamin D, Ergocalciferol, (DRISDOL) 50000 UNITS CAPS capsule Take 1 capsule (50,000 Units total) by mouth every 7 (seven) days. On Wednesday: For bone health Patient not taking: Reported on 03/22/2015 12/20/14   Encarnacion Slates, NP   Physical Exam: Blood pressure 104/62, pulse 124, temperature 97.5 F (36.4 C), temperature source Oral, resp. rate 14, height 5' 4.5" (1.638 m), weight 63.957 kg (141 lb). Filed Vitals:   05/27/15 1802  BP: 104/62  Pulse: 124  Temp:   Resp: 14     General:  Alert afebrile, appears tired and dehydrated.   Eyes: PERLA, pale conjunctiva.   Neck: no abnormal masses. No thyromegaly.   Cardiovascular: s1s2  Respiratory: clear to auscultation, no wheezing or rhonchi  Abdomen: soft non tender non distended bowel sounds heard  Skin: no rashes seen  Musculoskeletal: no pedal edema, cyanosis or clubbing.   Psychiatric: appears depressed.   Neurologic: alert and oriented to place and person, place and time. No focal deficits.   Labs on Admission:  Basic Metabolic Panel:  Recent Labs Lab 05/23/15 1508 05/24/15 0624 05/24/15 1038 05/25/15 1934 05/26/15 2035  NA 143  --   --   --  143  K 2.8* 3.0* 3.3*  --  3.5  CL 108  --   --   --  110  CO2 22  --   --   --  25  GLUCOSE 140*  --   --   --  127*  BUN 13  --   --   --  21*  CREATININE 0.84  --   --   --  0.63  CALCIUM 9.8  --   --   --  9.5  MG  --   --   --  2.1  --    Liver Function Tests:  Recent Labs Lab 05/23/15 1508  AST 25  ALT 12*  ALKPHOS 43  BILITOT 0.6  PROT 7.5  ALBUMIN 4.5   No results for input(s): LIPASE, AMYLASE in the last 168 hours. No results for input(s): AMMONIA in the last 168 hours. CBC:  Recent  Labs Lab 05/23/15 1508  WBC 7.7  HGB 14.0  HCT 41.0  MCV 91.7  PLT 185   Cardiac Enzymes: No results for input(s): CKTOTAL, CKMB, CKMBINDEX, TROPONINI in the last 168 hours. BNP: Invalid input(s): POCBNP CBG: No results for input(s): GLUCAP in the last 168 hours.  Radiological Exams on Admission: No results found.  EKG: pending.   Time spent: 50 minutes.   [facility] Pager [phone]  If 7PM-7AM, please contact night-coverage [email or web address] Password [facility] 05/27/2015, 7:28 PM

## 2015-05-27 NOTE — ED Provider Notes (Signed)
CSN: [id number]     Arrival date & time 05/27/15  2047 History   First MD Initiated Contact with Patient 05/27/15 2229     Chief Complaint  Patient presents with  . Dehydration     (Consider location/radiation/quality/duration/timing/severity/associated sxs/prior Treatment) HPI Comments: 64 year old female who [facility] with a complaint of fast heart rate. The patient has had several episodes over the last 24 hours where she has had a racing heart, it has been sinus tachycardia, the [facility] hospital[facility] consultation was required at the [facility] today and it was thought that the patient would need some IV fluids, that this could be related to alcohol withdrawal as well. The patient vehemently denies that she uses any kind of substances including alcohol, she states that she didn't feel bad until she got a dose of Thorazine last night. She denies any pain nausea vomiting chills or fevers. She has not been coughing or having any difficulty breathing. She was sent to the emergency department to receive IV fluids.  The history is provided by the patient.    Past Medical History  Diagnosis Date  . High cholesterol   . Depression   . Anxiety   . GERD (gastroesophageal reflux disease)   . Insomnia   . Arthritis   . Memory loss   . Emotional depression 02/04/2015   Past Surgical History  Procedure Laterality Date  . Cosmetic surgery    . Cesarean section    . Laproscopy     Family History  Problem Relation Age of Onset  . Sleep apnea Father   . Alcohol abuse Father   . Diabetes Mother   . Diabetes Sister   . Diabetes Maternal Uncle   . Diabetes Cousin    History  Substance Use Topics  . Smoking status: Never Smoker   . Smokeless tobacco: Never Used  . Alcohol Use: No     Comment: zero   OB History    No data available     Review of Systems  All other systems reviewed and are negative.     Allergies  Review of patient's  allergies indicates no known allergies.  Home Medications   Prior to Admission medications   Medication Sig Start Date End Date Taking? Authorizing Provider  acetaminophen (TYLENOL) 500 MG tablet Take 1,000 mg by mouth every 6 (six) hours as needed for mild pain or headache.    Historical Provider, MD  clonazePAM (KLONOPIN) 0.5 MG tablet Take 1 tablet (0.5 mg total) by mouth at bedtime. 03/22/15   [name], MD  cyanocobalamin (,VITAMIN B-12,) 1000 MCG/ML injection Inject 1 mL (1,000 mcg total) into the skin once a week. For bone health 12/20/14   Encarnacion Slates, NP  ibuprofen (ADVIL,MOTRIN) 200 MG tablet Take 800 mg by mouth every 6 (six) hours as needed.    Historical Provider, MD  memantine (NAMENDA) 10 MG tablet Take 1 tablet (10 mg total) by mouth 2 (two) times daily. 03/10/15   Penni Bombard, MD  mirtazapine (REMERON SOL-TAB) 30 MG disintegrating tablet Take 1 tablet (30 mg total) by mouth at bedtime. For depression/insomnia 03/22/15   [name], MD  nystatin (MYCOSTATIN/NYSTOP) 100000 UNIT/GM POWD Apply 1 application topically 2 (two) times daily. 03/28/15   Historical Provider, MD  OLANZapine zydis (ZYPREXA) 20 MG disintegrating tablet Take 1 tablet (20 mg total) by mouth at bedtime. For Insomnia, depression/mood control 03/22/15   [name], MD  omeprazole (  PRILOSEC) 20 MG capsule Take 1 capsule (20 mg total) by mouth daily. For acid reflux 12/20/14   Encarnacion Slates, NP  polyvinyl alcohol (LIQUIFILM TEARS) 1.4 % ophthalmic solution Place 1 drop into both eyes as needed for dry eyes. Patient not taking: Reported on 03/22/2015 12/20/14   Encarnacion Slates, NP  pravastatin (PRAVACHOL) 20 MG tablet Take 1 tablet (20 mg total) by mouth at bedtime. For high cholesterol 12/20/14   Encarnacion Slates, NP  Suvorexant (BELSOMRA) 10 MG TABS Take 10 mg by mouth at bedtime. 03/22/15   [name], MD  tamsulosin (FLOMAX) 0.4 MG CAPS capsule Take 1 capsule (0.4 mg total) by mouth daily after supper. For frequent  urination/urgency 12/20/14   Encarnacion Slates, NP  Vitamin D, Ergocalciferol, (DRISDOL) 50000 UNITS CAPS capsule Take 1 capsule (50,000 Units total) by mouth every 7 (seven) days. On Wednesday: For bone health Patient not taking: Reported on 03/22/2015 12/20/14   Encarnacion Slates, NP   BP 110/70 mmHg  Pulse 60  Temp(Src) 97.6 F (36.4 C) (Oral)  Resp 18  Ht 5' 4.5" (1.638 m)  Wt 141 lb (63.957 kg)  BMI 23.84 kg/m2  SpO2 100% Physical Exam  Constitutional: She appears well-developed and well-nourished. No distress.  HENT:  Head: Normocephalic and atraumatic.  Mouth/Throat: Oropharynx is clear and moist. No oropharyngeal exudate.  Eyes: Conjunctivae and EOM are normal. Pupils are equal, round, and reactive to light. Right eye exhibits no discharge. Left eye exhibits no discharge. No scleral icterus.  Neck: Normal range of motion. Neck supple. No JVD present. No thyromegaly present.  Cardiovascular: Normal rate, regular rhythm, normal heart sounds and intact distal pulses.  Exam reveals no gallop and no friction rub.   No murmur heard. Pulmonary/Chest: Effort normal and breath sounds normal. No respiratory distress. She has no wheezes. She has no rales.  Abdominal: Soft. Bowel sounds are normal. She exhibits no distension and no mass. There is no tenderness.  Musculoskeletal: Normal range of motion. She exhibits no edema or tenderness.  Lymphadenopathy:    She has no cervical adenopathy.  Neurological: She is alert. Coordination normal.  Skin: Skin is warm and dry. No rash noted. No erythema.  Psychiatric:  Flat affect, not responding to internal stimuli  Nursing note and vitals reviewed.   ED Course  Procedures (including critical care time) Labs Review  Imaging Review No results found.   MDM   Final diagnoses:  Hypokalemia    The patient has no tachycardia here, we'll obtain basic metabolic panel to ensure no abnormalities, IV fluids will be ordered, otherwise the patient appears  stable, she is more worried about her insomnia which is actively currently being treated at the behavioral health Hospital. Vital signs as below   Filed Vitals:   05/27/15 2308  BP: 110/70  Pulse: 60  Temp: 97.6 F (36.4 C)  Resp: 18    K replaced, VS with pulse < 100, pt stable to go back to [facility], recommend, no more thorazine.  Pulse < 100, K replaced, stable for transfer back to [facility]  Noemi Chapel, MD 05/27/15 2326

## 2015-05-27 NOTE — Progress Notes (Signed)
EKG completed and printed version was given to May, NP. Pt tolerated procedure with ease.

## 2015-05-27 NOTE — ED Notes (Signed)
[facility] here to transport pt back to [facility].

## 2015-05-28 ENCOUNTER — Other Ambulatory Visit: Payer: Self-pay

## 2015-05-28 ENCOUNTER — Inpatient Hospital Stay (HOSPITAL_COMMUNITY): Payer: Medicare Other

## 2015-05-28 MED ORDER — POTASSIUM CHLORIDE 20 MEQ/15ML (10%) PO SOLN
40.0000 meq | Freq: Once | ORAL | Status: AC
  Administered 2015-05-28: 40 meq via ORAL
  Filled 2015-05-28: qty 30

## 2015-05-28 MED ORDER — POTASSIUM CHLORIDE 20 MEQ/15ML (10%) PO SOLN
20.0000 meq | Freq: Once | ORAL | Status: AC
  Administered 2015-05-28: 20 meq via ORAL
  Filled 2015-05-28: qty 15

## 2015-05-28 MED ORDER — MAGNESIUM OXIDE 400 (241.3 MG) MG PO TABS
800.0000 mg | ORAL_TABLET | ORAL | Status: AC
  Administered 2015-05-28 (×2): 800 mg via ORAL
  Filled 2015-05-28 (×2): qty 2

## 2015-05-28 MED ORDER — MEGESTROL ACETATE 400 MG/10ML PO SUSP
400.0000 mg | Freq: Every day | ORAL | Status: DC
  Administered 2015-05-28 – 2015-06-02 (×6): 400 mg via ORAL
  Filled 2015-05-28 (×3): qty 10
  Filled 2015-05-28: qty 100
  Filled 2015-05-28 (×4): qty 10

## 2015-05-28 MED ORDER — POTASSIUM CHLORIDE CRYS ER 20 MEQ PO TBCR
20.0000 meq | EXTENDED_RELEASE_TABLET | Freq: Once | ORAL | Status: DC
  Filled 2015-05-28: qty 1

## 2015-05-28 MED ORDER — TRIAZOLAM 0.125 MG PO TABS: 0.1250 mg | ORAL_TABLET | Freq: Every day | ORAL | Status: DC

## 2015-05-28 MED ORDER — FLUVOXAMINE MALEATE 100 MG PO TABS
100.0000 mg | ORAL_TABLET | Freq: Every day | ORAL | Status: DC
  Administered 2015-05-28 – 2015-05-30 (×3): 100 mg via ORAL
  Filled 2015-05-28 (×4): qty 1

## 2015-05-28 MED ORDER — NYSTATIN 100000 UNIT/GM EX POWD
Freq: Two times a day (BID) | CUTANEOUS | Status: DC
  Administered 2015-05-28 – 2015-05-29 (×2): via TOPICAL
  Filled 2015-05-28: qty 15

## 2015-05-28 MED ORDER — POTASSIUM CHLORIDE CRYS ER 20 MEQ PO TBCR
40.0000 meq | EXTENDED_RELEASE_TABLET | Freq: Once | ORAL | Status: DC
  Filled 2015-05-28: qty 2

## 2015-05-28 NOTE — [facility] Group Notes (Signed)
  [facility] Group Notes:  (Nursing/MHT/Case Management/Adjunct)  Date:  05/28/2015  Time:  2:50 PM  Type of Therapy:  Nurse Education  Participation Level:  Minimal  Participation Quality:  Drowsy  Affect:  Anxious  Cognitive:  Alert  Insight:  Lacking  Engagement in Group:  Engaged  Modes of Intervention:  Support  Summary of Progress/Problems:  Lauralyn Primes 05/28/2015, 2:50 PM

## 2015-05-28 NOTE — Progress Notes (Signed)
Consult Progress Note                                           Patient Demographics:    Kendra Simmons, is a 64 y.o. female, DOB - Jan 24, 1951, LFY:[id number]  Admit date - 05/24/2015   Admitting Physician Ursula Alert, MD  Outpatient Primary MD for the patient is [name], MD  LOS - 4   Chief Complaint  Patient presents with  . Dehydration        Subjective:    Kendra Simmons today has, No headache, No chest pain, No abdominal pain - No Nausea, No new weakness tingling or numbness, No Cough - SOB.     Assessment  & Plan :    1.S.Tach - resolved, checked personally 10 am with EKG rate 68 - 72/min, have requested the primary physician Dr. Arbie Cookey to obtain a repeat EKG and request hospitalist input if heart rate goes above 100. Continue low-dose beta blocker. Check TSH, monitor oral intake closely as she is extremely depressed with decreased oral intake and might get dehydrated.   2. Hypokalemia. Replaced. Check potassium and magnesium in the morning.   3. Dyslipidemia. Continue home dose statin.   4. Depression/anxiety. Defer to primary team which is psychiatry.     Inpatient Medications  Scheduled Meds: . clonazePAM  1 mg Oral QHS  . feeding supplement (ENSURE ENLIVE)  237 mL Oral BID BM  . fluvoxaMINE  50 mg Oral QHS  . magnesium oxide  800 mg Oral Q4H  . memantine  10 mg Oral BID  . metoprolol tartrate  12.5 mg Oral BID  . mirtazapine  15 mg Oral QHS  . OLANZapine zydis  20 mg Oral QHS  . pantoprazole  40 mg Oral Daily  . potassium chloride  20 mEq Oral Once  . pravastatin  20 mg Oral QHS  . tamsulosin  0.4 mg Oral QPC supper  . triazolam  0.125 mg Oral QHS   Continuous Infusions:  PRN Meds:.alum & mag hydroxide-simeth, ibuprofen, loperamide, magnesium hydroxide  Antibiotics      Anti-infectives    None        Objective:   Filed Vitals:   05/27/15 2308 05/27/15 2336 05/28/15 0619 05/28/15 0620  BP: 110/70 108/71 118/86 106/70  Pulse: 60 66 107 140 (manual check 10 am 68)  Temp: 97.6 F (36.4 C) 97.8 F (36.6 C) 97.6 F (36.4 C) 97.6 F (36.4 C)  TempSrc: Oral Oral Oral Oral  Resp: 18 18 16 16   Height:      Weight:      SpO2: 100% 99%      Wt Readings from Last 3 Encounters:  05/24/15 63.957 kg (141 lb)  05/23/15 66.679 kg (147 lb)  03/22/15 66.86 kg (147 lb 6.4 oz)     Intake/Output Summary (Last 24 hours) at 05/28/15 1104 Last data filed at 05/27/15 2307  Gross per 24 hour  Intake   1000 ml  Output      0 ml  Net   1000 ml     Physical Exam  Awake Alert, Oriented X 3, No new F.N deficits, Flat and depressed affect Kendra Simmons.AT,PERRAL Supple Neck,No JVD, No cervical lymphadenopathy appriciated.  Symmetrical Chest wall movement,  Good air movement bilaterally, CTAB RRR,No Gallops,Rubs or new Murmurs, No Parasternal Heave +ve B.Sounds, Abd Soft, No tenderness, No organomegaly appriciated, No rebound - guarding or rigidity. No Cyanosis, Clubbing or edema, No new Rash or bruise      Data Review:   Micro Results No results found for this or any previous visit (from the past 240 hour(s)).  Radiology Reports No results found.   CBC  Recent Labs Lab 05/23/15 1508 05/27/15 2255  WBC 7.7  --   HGB 14.0 11.6*  HCT 41.0 34.0*  PLT 185  --   MCV 91.7  --   MCH 31.3  --   MCHC 34.1  --   RDW 14.4  --     Chemistries   Recent Labs Lab 05/23/15 1508 05/24/15 0624 05/24/15 1038 05/25/15 1934 05/26/15 2035 05/27/15 2255  NA 143  --   --   --  143 144  K 2.8* 3.0* 3.3*  --  3.5 2.9*  CL 108  --   --   --  110 107  CO2 22  --   --   --  25  --   GLUCOSE 140*  --   --   --  127* 123*  BUN 13  --   --   --  21* 15  CREATININE 0.84  --   --   --  0.63 0.60  CALCIUM 9.8  --   --   --  9.5  --   MG  --   --   --  2.1  --   --    AST 25  --   --   --   --   --   ALT 12*  --   --   --   --   --   ALKPHOS 43  --   --   --   --   --   BILITOT 0.6  --   --   --   --   --    ------------------------------------------------------------------------------------------------------------------ estimated creatinine clearance is 62.7 mL/min (by C-G formula based on Cr of 0.6). ------------------------------------------------------------------------------------------------------------------ No results for input(s): HGBA1C in the last 72 hours. ------------------------------------------------------------------------------------------------------------------ No results for input(s): CHOL, HDL, LDLCALC, TRIG, CHOLHDL, LDLDIRECT in the last 72 hours. ------------------------------------------------------------------------------------------------------------------ No results for input(s): TSH, T4TOTAL, T3FREE, THYROIDAB in the last 72 hours.  Invalid input(s): FREET3 ------------------------------------------------------------------------------------------------------------------ No results for input(s): VITAMINB12, FOLATE, FERRITIN, TIBC, IRON, RETICCTPCT in the last 72 hours.  Coagulation profile No results for input(s): INR, PROTIME in the last 168 hours.  No results for input(s): DDIMER in the last 72 hours.  Cardiac Enzymes No results for input(s): CKMB, TROPONINI, MYOGLOBIN in the last 168 hours.  Invalid input(s): CK ------------------------------------------------------------------------------------------------------------------ Invalid input(s): POCBNP   Time Spent in minutes  35   Kendra Simmons K M.D on 05/28/2015 at 11:04 AM  Between 7am to 7pm - Pager - [phone]  After 7pm go to [email or web address] - password [facility]  [facility]   Office  [phone]

## 2015-05-28 NOTE — Plan of Care (Signed)
Problem: Ineffective individual coping Goal: LTG: Patient will report a decrease in negative feelings Outcome: Not Progressing Pt continues to be sad and depressed at this time  Goal: STG: Patient will remain free from self harm Outcome: Progressing Pt safe on the unit  Problem: Alteration in mood; excessive anxiety as evidenced by: Goal: LTG-Patient's behavior demonstrates decreased anxiety Pt presents with anxious affect, and is c/o panic attacks. Goal is for her to rate her anxiety at a 4 or less on a 10 scale at d/c. Goal not met. R [facility] 05/25/2015 4:29 PM  Outcome: Not Progressing Pt continues to appear anxious and paranoid on the unit, pt stated she was worried about things in her life.

## 2015-05-28 NOTE — Progress Notes (Signed)
D: Pt denies SI/HI/AVH. Pt is pleasant and cooperative. Pt up and moving around the milieu. Pt still appears depressed at times. Pt was offered help with a bath by female Tech , pt refused.    A: Pt was offered support and encouragement. Pt was given scheduled medications. Pt was encourage to attend groups. Q 15 minute checks were done for safety.   R:Pt attends groups. Pt is taking medication. Pt has no complaints at this time .Pt receptive to treatment and safety maintained on unit.

## 2015-05-28 NOTE — Progress Notes (Signed)
D Internist in to see pt and Dr Shea Evans assess pt also. Pt remains disconnected. Flat. Refusing to go to [facility]  For echocardiogram ( after it is arranged) and Dr. Shea Evans made aware by this writer. Pt took potassium elixir  as scheduled and then swallowed magnesium pills, even though she said she was unable to. She makes statements like " I feel like Im  in the way"... ,  " why  Is the doctor mad at me???", "  . Pt slept off and on, after lunch ( approx 2 -  2 1/ 2 hr and has eaten about 1/2 of her lunch and her dinner( yesterday she refused to eat ANY food. Her BP this evening is 92/44 and her HR is 83. Night shift RN made aware to contact MD if HR increases.    R Safety in place.

## 2015-05-28 NOTE — Progress Notes (Signed)
Adult Psychoeducational Group Note  Date:  05/28/2015 Time:  8:57 PM  Group Topic/Focus:  Wrap-Up Group:   The focus of this group is to help patients review their daily goal of treatment and discuss progress on daily workbooks.  Participation Level:  Active  Participation Quality:  Appropriate  Affect:  Appropriate  Cognitive:  Appropriate  Insight: Appropriate  Engagement in Group:  Engaged  Modes of Intervention:  Discussion  Additional Comments: The patient expressed that she did not attended group today.The patient also said that she did not have any needs tonight.  Nash Shearer 05/28/2015, 8:57 PM

## 2015-05-28 NOTE — Progress Notes (Signed)
[facility] MD Progress Note  05/28/2015 11:17 AM Kendra Simmons  MRN:  [id number] Subjective:  Patient states " I had to go to the ED last night to get IV fluids and after I cam back I slept a little bit better.'     Objective:Patient seen and chart reviewed.Discussed patient with treatment team. Kendra Simmons is a 64 y.o. Caucasian female who lives by self in [place], she is divorced , has three adult children, is on SSD, has a hx of depression as well as insomnia and dementia .Patient presented to [facility] with her sister due to the above concerns . Per initial notes in EHR -pt spoke to Dr.Arfeen - her out patient psychiatrist who felt she should come in to the hospital.    Patient seen today in her bed. Her affect is more reactive than the previous day - seen as smiling. Pt had an episode of tachycardia as well as dizzy spell yesterday - BMP showed K+ to be 2.9 ( low). Pt was send to ED - where she was given Iv fluids . Pt's Thorazine was stopped last night - restarted on Zyprexa.  Pt today reports sleep as better last night - this is the first time since admission she has ever reported " sleep as better". She has been having chronic insomnia since the past several years , most recently past 10 days or so. Pt was tried on several medications - failed trials of several meds- started on Halcion last night - to which she responded well. It could be a combination of the IV fluids as well as the medications that could have helped her to feel more rested last night.  Pt was alert , today - oriented x3 .   Pt continues to have loss of appetite - encouraged to take more fluids , soft diet .   Spoke to Hospitalist - who came to speak to Probation officer directly - plan to monitor VS- if pulse is high >100 - get EKG stat and notify hospitalist.       Principal Problem: MDD (major depressive disorder), recurrent, severe, with psychosis Diagnosis:   Patient Active Problem List   Diagnosis Date Noted  .  Tachycardia [R00.0] 05/27/2015  . MDD (major depressive disorder), recurrent, severe, with psychosis [F33.3] 05/25/2015  . 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 Snoring [R06.83] 12/24/2014  . Unintended weight loss [R63.4] 12/24/2014  . 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 Panic attacks [F41.0] 07/26/2014  . Thrombocytopenia [D69.6] 12/17/2012  . DYSLIPIDEMIA [E78.5] 03/13/2010  . GERD [K21.9] 03/13/2010   Total Time spent with patient: 30 minutes   Past Medical History:  Past Medical History  Diagnosis Date  . High cholesterol   . Depression   . Anxiety   . GERD (gastroesophageal reflux disease)   . Insomnia   . Arthritis   . Memory loss   . Emotional depression 02/04/2015    Past Surgical History  Procedure Laterality Date  . Cosmetic surgery    . Cesarean section    . Laproscopy     Family History:  Family History  Problem Relation Age of Onset  . Sleep apnea Father   . Alcohol abuse Father   . Diabetes Mother   . Diabetes Sister   . Diabetes Maternal Uncle   . Diabetes Cousin    Social History:  History  Alcohol Use No  Comment: zero     History  Drug Use No    Comment: Denies any drug use other than benzos    History   Social History  . Marital Status: Divorced    Spouse Name: N/A  . Number of Children: 3  . Years of Education: 14   Occupational History  . Retired    Social History Main Topics  . Smoking status: Never Smoker   . Smokeless tobacco: Never Used  . Alcohol Use: No     Comment: zero  . Drug Use: No     Comment: Denies any drug use other than benzos  . Sexual Activity: No   Other Topics Concern  . None   Social History Narrative   Patient lives at home alone.   Caffeine Use:  2 cokes daily   Sister lives next door.   Additional History:    Sleep: Fair  Appetite:   Poor    Musculoskeletal: Strength & Muscle Tone: within normal limits Gait & Station: normal Patient leans: N/A   Psychiatric Specialty Exam: Physical Exam  Review of Systems  Psychiatric/Behavioral: Positive for depression and suicidal ideas. The patient is nervous/anxious and has insomnia (improving).   All other systems reviewed and are negative.   Blood pressure 106/70, pulse 140, temperature 97.6 F (36.4 C), temperature source Oral, resp. rate 16, height 5' 4.5" (1.638 m), weight 63.957 kg (141 lb), SpO2 99 %.Body mass index is 23.84 kg/(m^2).  General Appearance: Fairly Groomed  Engineer, water::  Fair  Speech:  Normal Rate  Volume:  Decreased  Mood:  Depressed  Affect:  Blunt more reactive - seen as smiling  Thought Process:  Goal Directed  Orientation:  Full (Time, Place, and Person)  Thought Content:  Rumination  Suicidal Thoughts:  No but states she is hopeless about her situation  Homicidal Thoughts:  No  Memory:  Immediate;   Fair Recent;   Fair Remote;   Fair  Judgement:  Fair  Insight:  Fair  Psychomotor Activity:  Decreased  Concentration:  Poor  Recall:  [employer] of Knowledge:Fair  Language: Fair  Akathisia:  No  Handed:  Right  AIMS (if indicated):     Assets:  Communication Skills Desire for Improvement Social Support  ADL's:  Intact  Cognition: WNL  Sleep:  Number of Hours: 4     Current Medications: Current Facility-Administered Medications  Medication Dose Route Frequency Provider Last Rate Last Dose  . alum & mag hydroxide-simeth (MAALOX/MYLANTA) 200-200-20 MG/5ML suspension 30 mL  30 mL Oral Q4H PRN Patrecia Pour, NP      . clonazePAM Bobbye Charleston) tablet 1 mg  1 mg Oral QHS Ursula Alert, MD   1 mg at 05/28/15 0032  . feeding supplement (ENSURE ENLIVE) (ENSURE ENLIVE) liquid 237 mL  237 mL Oral BID BM Clayton Bibles, RD   237 mL at 05/27/15 1000  . fluvoxaMINE (LUVOX) tablet 100 mg  100 mg Oral QHS Reynald Woods, MD      . ibuprofen  (ADVIL,MOTRIN) tablet 400 mg  400 mg Oral Q6H PRN Ursula Alert, MD   400 mg at 05/28/15 0911  . loperamide (IMODIUM) capsule 2 mg  2 mg Oral PRN Kerrie Buffalo, NP      . magnesium hydroxide (MILK OF MAGNESIA) suspension 30 mL  30 mL Oral Daily PRN Patrecia Pour, NP      . megestrol (MEGACE) 400 MG/10ML suspension 400 mg  400 mg Oral Daily Ursula Alert, MD      .  memantine (NAMENDA) tablet 10 mg  10 mg Oral BID Patrecia Pour, NP   10 mg at 05/28/15 7616  . metoprolol tartrate (LOPRESSOR) tablet 12.5 mg  12.5 mg Oral BID Hosie Poisson, MD   12.5 mg at 05/28/15 1103  . mirtazapine (REMERON SOL-TAB) disintegrating tablet 15 mg  15 mg Oral QHS Ursula Alert, MD   15 mg at 05/28/15 0032  . OLANZapine zydis (ZYPREXA) disintegrating tablet 20 mg  20 mg Oral QHS Ursula Alert, MD   20 mg at 05/28/15 0030  . pantoprazole (PROTONIX) EC tablet 40 mg  40 mg Oral Daily Patrecia Pour, NP   40 mg at 05/28/15 0810  . potassium chloride 20 MEQ/15ML (10%) solution 20 mEq  20 mEq Oral Once Isay Perleberg, MD      . pravastatin (PRAVACHOL) tablet 20 mg  20 mg Oral QHS Patrecia Pour, NP   20 mg at 05/28/15 0032  . tamsulosin (FLOMAX) capsule 0.4 mg  0.4 mg Oral QPC supper Ursula Alert, MD   0.4 mg at 05/28/15 0032  . triazolam (HALCION) tablet 0.125 mg  0.125 mg Oral QHS Ursula Alert, MD   0.125 mg at 05/27/15 2351    Lab Results:  Results for orders placed or performed during the hospital encounter of 05/24/15 (from the past 48 hour(s))  Basic metabolic panel     Status: Abnormal   Collection Time: 05/26/15  8:35 PM  Result Value Ref Range   Sodium 143 135 - 145 mmol/L   Potassium 3.5 3.5 - 5.1 mmol/L   Chloride 110 101 - 111 mmol/L   CO2 25 22 - 32 mmol/L   Glucose, Bld 127 (H) 65 - 99 mg/dL   BUN 21 (H) 6 - 20 mg/dL   Creatinine, Ser 0.63 0.44 - 1.00 mg/dL   Calcium 9.5 8.9 - 10.3 mg/dL   GFR calc non Af Amer >60 >60 mL/min   GFR calc Af Amer >60 >60 mL/min    Comment: (NOTE) The eGFR has  been calculated using the CKD EPI equation. This calculation has not been validated in all clinical situations. eGFR's persistently <60 mL/min signify possible Chronic Kidney Disease.    Anion gap 8 5 - 15    Comment: Performed at [facility]     Status: Abnormal   Collection Time: 05/27/15 10:55 PM  Result Value Ref Range   Sodium 144 135 - 145 mmol/L   Potassium 2.9 (L) 3.5 - 5.1 mmol/L   Chloride 107 101 - 111 mmol/L   BUN 15 6 - 20 mg/dL   Creatinine, Ser 0.60 0.44 - 1.00 mg/dL   Glucose, Bld 123 (H) 65 - 99 mg/dL   Calcium, Ion 1.20 1.13 - 1.30 mmol/L   TCO2 18 0 - 100 mmol/L   Hemoglobin 11.6 (L) 12.0 - 15.0 g/dL   HCT 34.0 (L) 36.0 - 46.0 %    Physical Findings: AIMS: Facial and Oral Movements Muscles of Facial Expression: None, normal Lips and Perioral Area: None, normal Jaw: None, normal Tongue: None, normal,Extremity Movements Upper (arms, wrists, hands, fingers): None, normal Lower (legs, knees, ankles, toes): None, normal, Trunk Movements Neck, shoulders, hips: None, normal, Overall Severity Severity of abnormal movements (highest score from questions above): None, normal Incapacitation due to abnormal movements: None, normal Patient's awareness of abnormal movements (rate only patient's report): No Awareness, Dental Status Current problems with teeth and/or dentures?: No Does patient usually wear dentures?: No  CIWA:  CIWA-Ar Total: 2 COWS:  COWS Total Score: 3   05/26/15 Collateral information was obtained from [place] - her outpt provider - per Dr.Arfeen - pt has been dealing with severe chronic insomnia for years - has been tried on several medications and failed . Pt does sleep at night more than what she states- she also has a problem with her perception of how much she sleeps - since she has been observed on the unit closely by staff in the past - inspite of previous documentation of how much better she has slept in the  past - she always feels she does not sleep at all.     Assessment: Patient is a 17 y old CF who has a hx of depression as well as chronic severe insomnia , dementia ( Alzheimer's versus lewy body ), today she appears improved , than previous days- her affect is more reactive and she is seen as smiling more often, however continues to state ' I am hopeless". Pt reports sleep as improved last night. Hospitalist has been following her tachycardia . They will continue to follow.    Treatment Plan Summary: Daily contact with patient to assess and evaluate symptoms and progress in treatment and Medication management  Will continue Remeron 15 mg po qhs - to help with sleep .Remeron is more sedating at lower doses. Will increase Luvox to 100 mg po qhs for depression. Will continue Klonopin 1 mg po qhs for anxiety sx. Will continue Halcion 0.125 mg po qhs for sleep. Will continue Zyprexa 20 mg po qhs - her home dose . Marland KitchenAIMS - 0 ( 05/27/15)  Start Megace 400 mg po daily for loss of appetite  Patient to be observed on the unit for worsening sleep issues as well as suicidal ideation.  Patient with tachycardia on and off - will monitor VS- EKG STAT if she is tachycardic and notify Hospitalist .  Will continue Namenda 10 mg bid po for dementia.  Will continue Pravachol 20 mg po daily for hyperlipidemia.  Will continue Protonix 40 mg po daily for GERD  Patient is on Falls precautions .  CSW will start working on disposition. Patient to be referred to ALF on discharge.  Patient to participate in therapeutic milieu .    Medical Decision Making:  Review of Psycho-Social Stressors (1), Review or order clinical lab tests (1), Discuss test with performing physician (1), Review and summation of old records (2), Established Problem, Worsening (2), Review of Last Therapy Session (1), Review of Medication Regimen & Side Effects (2) and Review of New Medication or Change in Dosage  (2)     Breshay Ilg MD 05/28/2015, 11:17 AM

## 2015-05-28 NOTE — Progress Notes (Signed)
D: Pt denies HI/AVH. Pt is pleasant and cooperative. Pt +ve SI-contracts for safety. Pt is paranoid and worried about her situation. Pt was seen earlier in the day by a Hospitalist and they requested for pt get 1000 ml 0.9 NS and @D  ECHO. Writer informed hospital NP that both could be done tomorrow, but he insisted that she get it done tonight . Pt is a little somatic, but pt cooperates with staff.    A: Pt was offered support and encouragement. Pt was given scheduled medications. Pt was encourage to attend groups. Q 15 minute checks were done for safety.   R: Pt is taking medication. Pt has no complaints at this time .Pt receptive to treatment and safety maintained on unit.

## 2015-05-28 NOTE — [facility] Group Notes (Signed)
[place] Group Notes:  (Clinical Social Work)  05/28/2015  11:15-12:00PM  Summary of Progress/Problems:   The main focus of today's process group was to discuss patients' feelings related to being hospitalized, as well as the difference between "being" and "having" a mental health diagnosis.  It was agreed in general by the group that it would be preferable to avoid future hospitalizations, and we discussed means of doing that.  As a follow-up, problems with adhering to medication recommendations were discussed.  The patient expressed mixed feelings about being hospitalized, and stated she really needs to sleep, has now not slept but a few hours here and there for 8 days.  She did not remain in the group.  Type of Therapy:  Group Therapy - Process  Participation Level:  Minimal  Participation Quality:  Attentive  Affect:  Flat and Irritable  Cognitive:  Confused  Insight:  Limited  Engagement in Therapy:  Limited  Modes of Intervention:  Exploration, Discussion  Selmer Dominion, LCSW 05/28/2015, 12:45 PM

## 2015-05-29 DIAGNOSIS — R000 Tachycardia, unspecified: Secondary | ICD-10-CM

## 2015-05-29 LAB — TSH: TSH: 2.704 u[IU]/mL (ref 0.350–4.500)

## 2015-05-29 LAB — POTASSIUM: Potassium: 3.7 mmol/L (ref 3.5–5.1)

## 2015-05-29 LAB — MAGNESIUM: Magnesium: 1.8 mg/dL (ref 1.7–2.4)

## 2015-05-29 MED ORDER — METOPROLOL TARTRATE 25 MG PO TABS
12.5000 mg | ORAL_TABLET | Freq: Every morning | ORAL | Status: DC
  Administered 2015-05-31 – 2015-06-02 (×3): 12.5 mg via ORAL
  Filled 2015-05-29: qty 5
  Filled 2015-05-29 (×6): qty 1

## 2015-05-29 MED ORDER — LORATADINE 10 MG PO TABS
10.0000 mg | ORAL_TABLET | Freq: Every day | ORAL | Status: DC
  Administered 2015-05-29 – 2015-06-02 (×5): 10 mg via ORAL
  Filled 2015-05-29 (×2): qty 1
  Filled 2015-05-29: qty 10
  Filled 2015-05-29 (×4): qty 1

## 2015-05-29 NOTE — Progress Notes (Signed)
D: Patient seen on dayroom watching TV. No interaction. Requested to get her bed time medications earlier stated "I just want to go to bed". Patient was made aware that bed time meds were scheduled at 10 pm but can get it after 9 pm. Denies pain, SI, AH/VH at this time. No new complaint. A: Support/encouragement offered. Due medications given as ordered. Every 15 minutes check for safety maintained. Will continue to monitor patient. R: patient remains safe.

## 2015-05-29 NOTE — Progress Notes (Signed)
[name] was in her bed at the beginning of the shift. Refusing, as usual , to get OOB, to get up and bather, to allow this nurse to help her with ADL's...etc... Dr. Shea Evans initiated 1;1 , to assist [name] with ADL's and nursing was able to communicate with physician that ADL's for this pt could be supervised without the institution of 1:1.   A MHT assisted pt to bather, wash hair, change clothes and this nurse changed her bed linens. Pt was assisted to ambulate to dining room for both lunch and dinner and she tolerated this very well. She has been seen sitting in the dayroom with the other patients the rest of the afternoon and has even attended her groups.

## 2015-05-29 NOTE — Progress Notes (Signed)
[facility] 1:1 Observation Documentation  For the first (8) hours following discontinuation of 1:1 precautions, a progress note entry by nursing staff should be documented at least every 2 hours, reflecting the patient's behavior, condition, mood, and conversation.  Use the progress notes for additional entries.  Time 1:1 discontinued:  1015  Patient's Behavior:  quiet  Patient's Condition:  Stable; unchanged  Patient's Conversation:  None. Pt in cafe' eating lunch  Kendra Simmons 05/29/2015, 12:31 PM

## 2015-05-29 NOTE — Progress Notes (Addendum)
[facility] MD Progress Note  05/29/2015 3:07 PM Kendra Simmons  MRN:  [id number] Subjective:  Patient states " I am not sleeping.'     Objective:Patient seen and chart reviewed.Discussed patient with treatment team. Kendra Simmons is a 64 y.o. Caucasian female who lives by self in [place], she is divorced , has three adult children, is on SSD, has a hx of depression as well as insomnia and dementia .Patient presented to [facility] with her sister due to the above concerns . Per initial notes in EHR -pt spoke to Dr.Arfeen - her out patient psychiatrist who felt she should come in to the hospital.    Patient seen today in her bed. Patient was a little better yesterday - seen as smiling and also reported sleeping for the first time- however today she appears very depressed , flat affect , reports being hopeless again about her situation.  Pt per nursing has not been eating or taking showers . This was discussed with pt - who reports that she had assistance with all such activities of daily living while at home . Pt was told that we could arrange that here . Pt to have nursing assistance for taking her meals , as well as showers. Pt will need encouragement through out the day for the same. Whe encouraged pt does take her food and her fluids.  Pt is also being followed by hospitalist - who saw her tachycardia , hypokalemia -dehydration? - she received IVF the night before last - she was send to ED for management.  Her most recent K+ -wnl, Mg -wnl.  Pt is alert , today - oriented x3 .    Off note- patient has been taking naps all day - several hrs - also sleep documented as 5.25 hrs last night - per her out pt psychiatrist Dr.Arfeen - pt has problems with her sleep perception - pt to be referred to an ALF .   Principal Problem: MDD (major depressive disorder), recurrent, severe, with psychosis Diagnosis:   Patient Active Problem List   Diagnosis Date Noted  . Tachycardia [R00.0] 05/27/2015  . MDD  (major depressive disorder), recurrent, severe, with psychosis [F33.3] 05/25/2015  . 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 Snoring [R06.83] 12/24/2014  . Unintended weight loss [R63.4] 12/24/2014  . 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 Panic attacks [F41.0] 07/26/2014  . Thrombocytopenia [D69.6] 12/17/2012  . DYSLIPIDEMIA [E78.5] 03/13/2010  . GERD [K21.9] 03/13/2010   Total Time spent with patient: 30 minutes   Past Medical History:  Past Medical History  Diagnosis Date  . High cholesterol   . Depression   . Anxiety   . GERD (gastroesophageal reflux disease)   . Insomnia   . Arthritis   . Memory loss   . Emotional depression 02/04/2015    Past Surgical History  Procedure Laterality Date  . Cosmetic surgery    . Cesarean section    . Laproscopy     Family History:  Family History  Problem Relation Age of Onset  . Sleep apnea Father   . Alcohol abuse Father   . Diabetes Mother   . Diabetes Sister   . Diabetes Maternal Uncle   . Diabetes Cousin    Social History:  History  Alcohol Use No    Comment: zero     History  Drug Use No    Comment: Denies any drug  use other than benzos    History   Social History  . Marital Status: Divorced    Spouse Name: N/A  . Number of Children: 3  . Years of Education: 14   Occupational History  . Retired    Social History Main Topics  . Smoking status: Never Smoker   . Smokeless tobacco: Never Used  . Alcohol Use: No     Comment: zero  . Drug Use: No     Comment: Denies any drug use other than benzos  . Sexual Activity: No   Other Topics Concern  . None   Social History Narrative   Patient lives at home alone.   Caffeine Use:  2 cokes daily   Sister lives next door.   Additional History:    Sleep: Fair  Appetite:  Poor    Musculoskeletal: Strength &  Muscle Tone: within normal limits Gait & Station: normal Patient leans: N/A   Psychiatric Specialty Exam: Physical Exam  Review of Systems  Psychiatric/Behavioral: Positive for depression and suicidal ideas. The patient is nervous/anxious and has insomnia (improving).   All other systems reviewed and are negative.   Blood pressure 88/64, pulse 102, temperature 98 F (36.7 C), temperature source Oral, resp. rate 20, height 5' 4.5" (1.638 m), weight 63.957 kg (141 lb), SpO2 99 %.Body mass index is 23.84 kg/(m^2).  General Appearance: Fairly Groomed  Engineer, water::  Fair  Speech:  Normal Rate  Volume:  Decreased  Mood:  Depressed  Affect:  Blunt   Thought Process:  Goal Directed  Orientation:  Full (Time, Place, and Person)  Thought Content:  Rumination  Suicidal Thoughts:  No but states she is hopeless about her situation  Homicidal Thoughts:  No  Memory:  Immediate;   Fair Recent;   Fair Remote;   Fair  Judgement:  Fair  Insight:  Fair  Psychomotor Activity:  Decreased  Concentration:  Poor  Recall:  [employer] of Knowledge:Fair  Language: Fair  Akathisia:  No  Handed:  Right  AIMS (if indicated):     Assets:  Communication Skills Desire for Improvement Social Support  ADL's:  Intact  Cognition: WNL  Sleep:  Number of Hours: 5.25     Current Medications: Current Facility-Administered Medications  Medication Dose Route Frequency Provider Last Rate Last Dose  . alum & mag hydroxide-simeth (MAALOX/MYLANTA) 200-200-20 MG/5ML suspension 30 mL  30 mL Oral Q4H PRN Patrecia Pour, NP      . clonazePAM Bobbye Charleston) tablet 1 mg  1 mg Oral QHS Ursula Alert, MD   1 mg at 05/28/15 2200  . feeding supplement (ENSURE ENLIVE) (ENSURE ENLIVE) liquid 237 mL  237 mL Oral BID BM Clayton Bibles, RD   237 mL at 05/29/15 1400  . fluvoxaMINE (LUVOX) tablet 100 mg  100 mg Oral QHS Adenike Shidler, MD   100 mg at 05/28/15 2200  . ibuprofen (ADVIL,MOTRIN) tablet 400 mg  400 mg Oral Q6H PRN  Ursula Alert, MD   400 mg at 05/28/15 2201  . loperamide (IMODIUM) capsule 2 mg  2 mg Oral PRN Kerrie Buffalo, NP      . loratadine (CLARITIN) tablet 10 mg  10 mg Oral Daily Ursula Alert, MD   10 mg at 05/29/15 1200  . magnesium hydroxide (MILK OF MAGNESIA) suspension 30 mL  30 mL Oral Daily PRN Patrecia Pour, NP      . megestrol (MEGACE) 400 MG/10ML suspension 400 mg  400 mg Oral Daily Carlito Bogert  Theoren Palka, MD   400 mg at 05/29/15 0837  . memantine (NAMENDA) tablet 10 mg  10 mg Oral BID Patrecia Pour, NP   10 mg at 05/29/15 2542  . metoprolol tartrate (LOPRESSOR) tablet 12.5 mg  12.5 mg Oral BID Hosie Poisson, MD   12.5 mg at 05/29/15 7062  . mirtazapine (REMERON SOL-TAB) disintegrating tablet 15 mg  15 mg Oral QHS Mariluz Crespo, MD   15 mg at 05/28/15 2200  . nystatin (MYCOSTATIN/NYSTOP) topical powder   Topical BID Hau Sanor, MD      . OLANZapine zydis (ZYPREXA) disintegrating tablet 20 mg  20 mg Oral QHS Hodari Chuba, MD   20 mg at 05/28/15 2200  . pantoprazole (PROTONIX) EC tablet 40 mg  40 mg Oral Daily Patrecia Pour, NP   40 mg at 05/29/15 0837  . pravastatin (PRAVACHOL) tablet 20 mg  20 mg Oral QHS Patrecia Pour, NP   20 mg at 05/28/15 2200  . tamsulosin (FLOMAX) capsule 0.4 mg  0.4 mg Oral QPC supper Ursula Alert, MD   0.4 mg at 05/28/15 0032  . triazolam (HALCION) tablet 0.125 mg  0.125 mg Oral QHS Ursula Alert, MD   0.125 mg at 05/28/15 2201    Lab Results:  Results for orders placed or performed during the hospital encounter of 05/24/15 (from the past 48 hour(s))  I-stat chem 8, ed     Status: Abnormal   Collection Time: 05/27/15 10:55 PM  Result Value Ref Range   Sodium 144 135 - 145 mmol/L   Potassium 2.9 (L) 3.5 - 5.1 mmol/L   Chloride 107 101 - 111 mmol/L   BUN 15 6 - 20 mg/dL   Creatinine, Ser 0.60 0.44 - 1.00 mg/dL   Glucose, Bld 123 (H) 65 - 99 mg/dL   Calcium, Ion 1.20 1.13 - 1.30 mmol/L   TCO2 18 0 - 100 mmol/L   Hemoglobin 11.6 (L) 12.0 - 15.0 g/dL    HCT 34.0 (L) 36.0 - 46.0 %  Potassium     Status: None   Collection Time: 05/29/15  6:25 AM  Result Value Ref Range   Potassium 3.7 3.5 - 5.1 mmol/L    Comment: Performed at [facility]  Magnesium     Status: None   Collection Time: 05/29/15  6:25 AM  Result Value Ref Range   Magnesium 1.8 1.7 - 2.4 mg/dL    Comment: Performed at [facility]  TSH     Status: None   Collection Time: 05/29/15  6:25 AM  Result Value Ref Range   TSH 2.704 0.350 - 4.500 uIU/mL    Comment: Performed at [facility]    Physical Findings: AIMS: Facial and Oral Movements Muscles of Facial Expression: None, normal Lips and Perioral Area: None, normal Jaw: None, normal Tongue: None, normal,Extremity Movements Upper (arms, wrists, hands, fingers): None, normal Lower (legs, knees, ankles, toes): None, normal, Trunk Movements Neck, shoulders, hips: None, normal, Overall Severity Severity of abnormal movements (highest score from questions above): None, normal Incapacitation due to abnormal movements: None, normal Patient's awareness of abnormal movements (rate only patient's report): No Awareness, Dental Status Current problems with teeth and/or dentures?: No Does patient usually wear dentures?: No  CIWA:  CIWA-Ar Total: 2 COWS:  COWS Total Score: 3   05/26/15 Collateral information was obtained from [place] - her outpt provider - per Dr.Arfeen - pt has been dealing with severe chronic insomnia for years - has  been tried on several medications and failed . Pt does sleep at night more than what she states- she also has a problem with her perception of how much she sleeps - since she has been observed on the unit closely by staff in the past - inspite of previous documentation of how much better she has slept in the past - she always feels she does not sleep at all.     Assessment: Patient is a 25 y old CF who has a hx of depression as well as chronic  severe insomnia , dementia ( Alzheimer's versus lewy body ), today she continues to be depressed , hopeless .  Hospitalist has been following her tachycardia . They will continue to follow. Will continue treatment.    Treatment Plan Summary: Daily contact with patient to assess and evaluate symptoms and progress in treatment and Medication management  Will continue Remeron 15 mg po qhs - to help with sleep .Remeron is more sedating at lower doses. Will continue Luvox 100 mg po qhs for depression. Will continue Klonopin 1 mg po qhs for anxiety sx. Will continue Halcion 0.125 mg po qhs for sleep. Will continue Zyprexa 20 mg po qhs - her home dose . Marland KitchenAIMS - 0 ( 05/27/15)  Started Megace 400 mg po daily for loss of appetite   Patient with tachycardia on and off - will monitor VS- EKG STAT if she is tachycardic and notify Hospitalist .Patient has refused echocardiogram.  Will continue Namenda 10 mg bid po for dementia.  Will continue Pravachol 20 mg po daily for hyperlipidemia.  Will continue Protonix 40 mg po daily for GERD  Patient is on Falls precautions .  CSW will start working on disposition. Patient to be referred to ALF on discharge.  Patient to participate in therapeutic milieu .    Medical Decision Making:  Review of Psycho-Social Stressors (1), Review or order clinical lab tests (1), Discuss test with performing physician (1), Review and summation of old records (2), Established Problem, Worsening (2), Review of Last Therapy Session (1), Review of Medication Regimen & Side Effects (2) and Review of New Medication or Change in Dosage (2)     Treon Kehl MD 05/29/2015, 3:07 PM

## 2015-05-29 NOTE — BHH Group Notes (Signed)
[place] Group Notes:  (Clinical Social Work)  05/29/2015  [place] Group Notes:  (Clinical Social Work)  05/29/2015  11:00AM-12:00PM  Summary of Progress/Problems:  The main focus of today's process group was to listen to a variety of genres of music and to identify that different types of music provoke different responses.  The patient then was able to identify personally what was soothing for them, as well as energizing.  Handouts were used to record feelings evoked, as well as how patient can personally use this knowledge in sleep habits, with depression, and with other symptoms.  The patient expressed understanding of concepts, as well as knowledge of how each type of music affected him/her and how this can be used at home as a wellness/recovery tool.  She had flat affect throughout group but listened, and was able to identify her various reactions/feelings with each song.  Type of Therapy:  Music Therapy   Participation Level:  Active  Participation Quality:  Attentive and Sharing  Affect:  Blunted  Cognitive:  Oriented  Insight:  Engaged  Engagement in Therapy:  Engaged  Modes of Intervention:   Activity, Exploration  Selmer Dominion, LCSW 05/29/2015

## 2015-05-29 NOTE — Progress Notes (Signed)
[place] [facility] 1:1 Observation Documentation  For the first (8) hours following discontinuation of 1:1 precautions, a progress note entry by nursing staff should be documented at least every 2 hours, reflecting the patient's behavior, condition, mood, and conversation.  Use the progress notes for additional entries.  Time 1:1 discontinued:  1015  Patient's Behavior:  quiet  Patient's Condition: stable   Patient's Conversation:  none  Lauralyn Primes 05/29/2015, 1015

## 2015-05-29 NOTE — Progress Notes (Signed)
Pt vital signs were 115/70  P- 85 sitting and 88/64 P-102. Pt was not symptomatic. Per Hospitalitis Joyice Faster , he believes pt is a little dehydrated and recommended to give fluids and re-check vitals in an hour. Pt given pink pitcher full of gatorade

## 2015-05-29 NOTE — Progress Notes (Signed)
[place] [facility] 1:1 Observation Documentation  For the first (8) hours following discontinuation of 1:1 precautions, a progress note entry by nursing staff should be documented at least every 2 hours, reflecting the patient's behavior, condition, mood, and conversation.  Use the progress notes for additional entries.  Time 1:1 discontinued:  1015  Patient's Behavior:  Watching TV  Patient's Condition: no change   Patient's Conversation:  none  Lauralyn Primes 05/29/2015, 2:44 PM

## 2015-05-29 NOTE — Progress Notes (Signed)
Adult Psychoeducational Group Note  Date:  05/29/2015 Time:  9:25 PM  Group Topic/Focus:  Wrap-Up Group:   The focus of this group is to help patients review their daily goal of treatment and discuss progress on daily workbooks.  Participation Level:  Active  Participation Quality:  Appropriate  Affect:  Appropriate  Cognitive:  Appropriate  Insight: Appropriate  Engagement in Group:  Engaged  Modes of Intervention:  Discussion  Additional Comments:  The patient expressed that she attended Music Therapy.The patient also said that music captures her imagination.Nash Shearer 05/29/2015, 9:25 PM

## 2015-05-29 NOTE — Progress Notes (Signed)
PATIENT DETAILS Name: Kendra Simmons Age: 64 y.o. Sex: female Date of Birth: 1951-03-11 Admit Date: 05/24/2015 Admitting Physician Ursula Alert, MD PCP:SUN,VYVYAN Y, MD  Subjective: No dizziness when she walks or stands. No other complaints-has a very flat affect  Assessment/Plan: Sinus Tachycardia: TSH within normal limits. Cautiously continue with metoprolol. Generous hydration. No further recommendation apart from monitoring.Await Echo=please notify hospitalist service if abnormal-will follow after Echo report is available.  Hypokalemia:resolved  Depression/Anxiety: per primary service  No further recommendations today  MEDICATIONS: Scheduled Meds: . clonazePAM  1 mg Oral QHS  . feeding supplement (ENSURE ENLIVE)  237 mL Oral BID BM  . fluvoxaMINE  100 mg Oral QHS  . loratadine  10 mg Oral Daily  . megestrol  400 mg Oral Daily  . memantine  10 mg Oral BID  . metoprolol tartrate  12.5 mg Oral BID  . mirtazapine  15 mg Oral QHS  . nystatin   Topical BID  . OLANZapine zydis  20 mg Oral QHS  . pantoprazole  40 mg Oral Daily  . pravastatin  20 mg Oral QHS  . tamsulosin  0.4 mg Oral QPC supper  . triazolam  0.125 mg Oral QHS   Continuous Infusions:  PRN Meds:.alum & mag hydroxide-simeth, ibuprofen, loperamide, magnesium hydroxide    PHYSICAL EXAM: Vital signs in last 24 hours: Filed Vitals:   05/28/15 1700 05/28/15 2100 05/29/15 0600 05/29/15 0601  BP: 92/44 115/71 115/70 88/64  Pulse: 83 75 85 102  Temp:   98 F (36.7 C)   TempSrc:      Resp: 14  20   Height:      Weight:      SpO2:        Weight change:  Filed Weights   05/24/15 1621  Weight: 63.957 kg (141 lb)   Body mass index is 23.84 kg/(m^2).   Gen Exam: Awake and alert with clear speech. Very flat affect Neck: Supple, No JVD.   Chest: B/L Clear.   CVS: S1 S2 Regular, no murmurs.  Abdomen: soft, BS +, non tender, non distended.  Extremities: no edema, lower extremities  warm to touch. Neurologic: Non Focal.   Skin: No Rash.   Wounds: N/A.    Intake/Output from previous day: No intake or output data in the 24 hours ending 05/29/15 1506   LAB RESULTS: CBC  Recent Labs Lab 05/23/15 1508 05/27/15 2255  WBC 7.7  --   HGB 14.0 11.6*  HCT 41.0 34.0*  PLT 185  --   MCV 91.7  --   MCH 31.3  --   MCHC 34.1  --   RDW 14.4  --     Chemistries   Recent Labs Lab 05/23/15 1508 05/24/15 0624 05/24/15 1038 05/25/15 1934 05/26/15 2035 05/27/15 2255 05/29/15 0625  NA 143  --   --   --  143 144  --   K 2.8* 3.0* 3.3*  --  3.5 2.9* 3.7  CL 108  --   --   --  110 107  --   CO2 22  --   --   --  25  --   --   GLUCOSE 140*  --   --   --  127* 123*  --   BUN 13  --   --   --  21* 15  --   CREATININE 0.84  --   --   --  0.63 0.60  --   CALCIUM 9.8  --   --   --  9.5  --   --   MG  --   --   --  2.1  --   --  1.8    CBG: No results for input(s): GLUCAP in the last 168 hours.  GFR Estimated Creatinine Clearance: 62.7 mL/min (by C-G formula based on Cr of 0.6).  Coagulation profile No results for input(s): INR, PROTIME in the last 168 hours.  Cardiac Enzymes No results for input(s): CKMB, TROPONINI, MYOGLOBIN in the last 168 hours.  Invalid input(s): CK  Invalid input(s): POCBNP No results for input(s): DDIMER in the last 72 hours. No results for input(s): HGBA1C in the last 72 hours. No results for input(s): CHOL, HDL, LDLCALC, TRIG, CHOLHDL, LDLDIRECT in the last 72 hours.  Recent Labs  05/29/15 0625  TSH 2.704   No results for input(s): VITAMINB12, FOLATE, FERRITIN, TIBC, IRON, RETICCTPCT in the last 72 hours. No results for input(s): LIPASE, AMYLASE in the last 72 hours.  Urine Studies No results for input(s): UHGB, CRYS in the last 72 hours.  Invalid input(s): UACOL, UAPR, USPG, UPH, UTP, UGL, UKET, UBIL, UNIT, UROB, ULEU, UEPI, UWBC, URBC, UBAC, CAST, UCOM, BILUA  MICROBIOLOGY: No results found for this or any previous visit  (from the past 240 hour(s)).  RADIOLOGY STUDIES/RESULTS: No results found.  Oren Binet, MD  [facility] Pager:[phone]  If 7PM-7AM, please contact night-coverage [email or web address] Password [email or web address] 05/29/2015, 3:06 PM   LOS: 5 days

## 2015-05-29 NOTE — Progress Notes (Signed)
D Per MD, pt to be on 1:1 and be given assist with ADL's, including walking, bathing, eating, etc... 1:1 1initiated per MD order. A This nurse checked pt's BP and HR as follow up to night shift vital signs at 0815 sitting 110/70 HRR 69 standing 100/72 HRR 91. Pt denies SI.

## 2015-05-30 MED ORDER — QUETIAPINE FUMARATE 200 MG PO TABS
200.0000 mg | ORAL_TABLET | Freq: Every day | ORAL | Status: DC
  Administered 2015-05-30: 200 mg via ORAL
  Filled 2015-05-30 (×3): qty 1

## 2015-05-30 NOTE — Progress Notes (Signed)
Recreation Therapy Notes  06.13.16 @  942 am LRT went to met with patient but patient was asleep.  LRT will follow up this afternoon.   [name], LRT/CTRS   06.13.16 @ 1257 pm LRT met with patient regarding relaxation techniques.  Patient stated she still wasn't sleeping.  Patient stated she tried do relaxation techniques such as deep breathing but it wasn't working.  Patient stated she didn't want to try going over the techniques again.  Patient stated she was hoping to see doctor and hoping for better results.  [name], LRT/CTRS   Ria Comment, Donise Woodle A 05/30/2015 1:40 PM

## 2015-05-30 NOTE — BHH Group Notes (Signed)
[facility] LCSW Aftercare Discharge Planning Group Note   05/30/2015 2:44 PM  Participation Quality:  Invited.  Chose to not attend    Kendra Simmons, Kendra Simmons

## 2015-05-30 NOTE — Progress Notes (Addendum)
D: Anhedonia noted. Slow movements, rates depression 10/10 on 1-10 scale, 10 being the worse.  "I am very disappointed that I am not sleeping well." She denies SI/HI. She also denies AVH. Blood pressure 93/59. C/O  Neck pain 10/10   A: Encouraged to get OOB and interact on the unit and attend groups. Water pitcher given, fluids encouraged. Encouraged to go to the dinning room to eat lunch. Medication administered per MD order. PRN medication administered for neck pain. Special checks q 15 mins in place for safety.    R: Compliant with medication regimen. Out of bed to attend lunch with much encouragement. Reports she attended group. Safety maintained.

## 2015-05-30 NOTE — Progress Notes (Signed)
"  I ate good, they had ravioli and okra." Patient reports she ate 50% of her dinner.

## 2015-05-30 NOTE — Progress Notes (Signed)
Patient observed in day room attending social work group.

## 2015-05-30 NOTE — Progress Notes (Signed)
Patient ID: Kendra Simmons, female   DOB: Jul 05, 1951, 64 y.o.   MRN: [id number] PER STATE REGULATIONS 482.30  THIS CHART WAS REVIEWED FOR MEDICAL NECESSITY WITH RESPECT TO THE PATIENT'S ADMISSION/ DURATION OF STAY.  NEXT REVIEW DATE:  06/01/2015   Chauncy Lean, RN, BSN CASE MANAGER

## 2015-05-30 NOTE — BHH Group Notes (Signed)
[place] LCSW Group Therapy  05/30/2015 1:15 pm  Type of Therapy: Process Group Therapy  Participation Level:  Active  Participation Quality:  Appropriate  Affect:  Flat  Cognitive:  Oriented  Insight:  Improving  Engagement in Group:  Limited  Engagement in Therapy:  Limited  Modes of Intervention:  Activity, Clarification, Education, Problem-solving and Support  Summary of Progress/Problems: Today's group addressed the issue of overcoming obstacles.  Patients were asked to identify their biggest obstacle post d/c that stands in the way of their on-going success, and then problem solve as to how to manage this.  Sitting with eyes closed for much of group.  When asked about obstacles, she stated she needs help with sleep because no one has found the right medication yet.  I gave her positive feedback for getting out of bed and coming to group, and then talked to her about circadian rhythms, explaining that even though she gets out of bed one day and does not nap, it does not translate  into immediate good sleep.  She appeared skeptical.  Kendra Simmons 05/30/2015   2:45 PM

## 2015-05-30 NOTE — Progress Notes (Signed)
Patient off unit in the dining room for dinner with much encouragement.

## 2015-05-30 NOTE — Plan of Care (Signed)
Problem: Ineffective individual coping Goal: STG: Patient will remain free from self harm Outcome: Progressing Patient has remained free from self harm

## 2015-05-30 NOTE — Tx Team (Signed)
Interdisciplinary Treatment Plan Update (Adult)  Date:  05/30/2015   Time Reviewed:  1:08 PM   Progress in Treatment: Attending groups: Yes. Participating in groups:  Yes. Taking medication as prescribed:  Yes. Tolerating medication:  Yes. Family/Significant othe contact made:  Yes Patient understands diagnosis:  Yes   Discussing patient identified problems/goals with staff:  Yes, see initial care plan. Medical problems stabilized or resolved:  Yes. Denies suicidal/homicidal ideation: Yes. Issues/concerns per patient self-inventory:  No. Other:  New problem(s) identified:  Discharge Plan or Barriers:  Placement,  [facility] eval done over the weekend  Reason for Continuation of Hospitalization: Delusions  Depression Medication stabilization Other; describe somatic complaints  Comments:  Patient seen today in her bed. Patient was a little better yesterday - seen as smiling and also reported sleeping for the first time- however today she appears very depressed , flat affect , reports being hopeless again about her situation.  Kendra Simmons per nursing has not been eating or taking showers . This was discussed with Kendra Simmons - who reports that she had assistance with all such activities of daily living while at home . Kendra Simmons was told that we could arrange that here . Kendra Simmons to have nursing assistance for taking her meals , as well as showers. Kendra Simmons will need encouragement through out the day for the same. When encouraged Kendra Simmons does take her food and her fluids.  Kendra Simmons is also being followed by hospitalist - who saw her tachycardia , hypokalemia -dehydration? - she received IVF the night before last - she was send to ED for management.   Zyprexa, klonopin, remeron trial  Estimated length of stay: 4-5 days  New goal(s):  Review of initial/current patient goals per problem list:     Attendees: Patient:  05/30/2015 1:08 PM   Family:   05/30/2015 1:08 PM   Physician:  Ursula Alert, MD 05/30/2015 1:08 PM   Nursing:    Gaylan Gerold, RN 05/30/2015 1:08 PM   CSW:    Roque Lias, LCSW   05/30/2015 1:08 PM   Other:  05/30/2015 1:08 PM   Other:   05/30/2015 1:08 PM   Other:  Lars Pinks, Nurse CM 05/30/2015 1:08 PM   Other:  Lucinda Dell, [facility] 05/30/2015 1:08 PM   Other:  Norberto Sorenson, [place]  05/30/2015 1:08 PM   Other:  05/30/2015 1:08 PM   Other:  05/30/2015 1:08 PM   Other:  05/30/2015 1:08 PM   Other:  05/30/2015 1:08 PM   Other:  05/30/2015 1:08 PM   Other:   05/30/2015 1:08 PM    Scribe for Treatment Team:   Trish Mage, 05/30/2015 1:08 PM

## 2015-05-30 NOTE — Progress Notes (Signed)
[facility] MD Progress Note  05/30/2015 7:11 PM Kendra Simmons  MRN:  [id number] Subjective:  Kendra Simmons continues to endorse insomnia. States she did not sleep last night although the record reflects 5.75 HRS. She states that without being able to sleep she is back feeling depressed not wanting to go on. States this is a miserable life she is living. States that Seroquel help her for a long time. She was using 400 mg HS. States she would not mind using a higher dose to see if she can sleep with it again Principal Problem: MDD (major depressive disorder), recurrent, severe, with psychosis Diagnosis:   Patient Active Problem List   Diagnosis Date Noted  . Tachycardia [R00.0] 05/27/2015  . MDD (major depressive disorder), recurrent, severe, with psychosis [F33.3] 05/25/2015  . 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 Snoring [R06.83] 12/24/2014  . Unintended weight loss [R63.4] 12/24/2014  . 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 Panic attacks [F41.0] 07/26/2014  . Thrombocytopenia [D69.6] 12/17/2012  . DYSLIPIDEMIA [E78.5] 03/13/2010  . GERD [K21.9] 03/13/2010   Total Time spent with patient: 30 minutes   Past Medical History:  Past Medical History  Diagnosis Date  . High cholesterol   . Depression   . Anxiety   . GERD (gastroesophageal reflux disease)   . Insomnia   . Arthritis   . Memory loss   . Emotional depression 02/04/2015    Past Surgical History  Procedure Laterality Date  . Cosmetic surgery    . Cesarean section    . Laproscopy     Family History:  Family History  Problem Relation Age of Onset  . Sleep apnea Father   . Alcohol abuse Father   . Diabetes Mother   . Diabetes Sister   . Diabetes Maternal Uncle   . Diabetes Cousin    Social History:  History  Alcohol Use No    Comment: zero     History  Drug Use No     Comment: Denies any drug use other than benzos    History   Social History  . Marital Status: Divorced    Spouse Name: N/A  . Number of Children: 3  . Years of Education: 14   Occupational History  . Retired    Social History Main Topics  . Smoking status: Never Smoker   . Smokeless tobacco: Never Used  . Alcohol Use: No     Comment: zero  . Drug Use: No     Comment: Denies any drug use other than benzos  . Sexual Activity: No   Other Topics Concern  . None   Social History Narrative   Patient lives at home alone.   Caffeine Use:  2 cokes daily   Sister lives next door.   Additional History:    Sleep: Poor  Appetite:  Poor   Assessment:   Musculoskeletal: Strength & Muscle Tone: within normal limits Gait & Station: normal Patient leans: normal   Psychiatric Specialty Exam: Physical Exam  Review of Systems  Constitutional: Positive for malaise/fatigue.  HENT: Negative.   Eyes: Negative.   Respiratory: Negative.   Cardiovascular: Negative.   Gastrointestinal: Negative.   Genitourinary: Negative.   Musculoskeletal: Negative.   Skin: Negative.   Neurological: Negative.   Endo/Heme/Allergies: Negative.   Psychiatric/Behavioral: Positive for depression. The patient is nervous/anxious and has insomnia.     Blood pressure 113/62, pulse 84,  temperature 98.4 F (36.9 C), temperature source Oral, resp. rate 18, height 5' 4.5" (1.638 m), weight 63.957 kg (141 lb), SpO2 99 %.Body mass index is 23.84 kg/(m^2).  General Appearance: Fairly Groomed  Engineer, water::  Minimal  Speech:  Clear and Coherent and Slow  Volume:  Decreased  Mood:  Anxious and Depressed  Affect:  Restricted  Thought Process:  Coherent and Goal Directed  Orientation:  Full (Time, Place, and Person)  Thought Content:  symptoms envents worries concerns  Suicidal Thoughts:  "getting there if I cant sleep"  Homicidal Thoughts:  No  Memory:  Immediate;   Fair Recent;   Fair Remote;   Fair   Judgement:  Fair  Insight:  Present and Shallow  Psychomotor Activity:  Decreased  Concentration:  Fair  Recall:  [employer] of Knowledge:Fair  Language: Fair  Akathisia:  No  Handed:  Right  AIMS (if indicated):     Assets:  Desire for Improvement  ADL's:  Intact  Cognition: WNL  Sleep:  Number of Hours: 5.75     Current Medications: Current Facility-Administered Medications  Medication Dose Route Frequency Provider Last Rate Last Dose  . alum & mag hydroxide-simeth (MAALOX/MYLANTA) 200-200-20 MG/5ML suspension 30 mL  30 mL Oral Q4H PRN Patrecia Pour, NP      . clonazePAM Bobbye Charleston) tablet 1 mg  1 mg Oral QHS Ursula Alert, MD   1 mg at 05/29/15 2116  . feeding supplement (ENSURE ENLIVE) (ENSURE ENLIVE) liquid 237 mL  237 mL Oral BID BM Clayton Bibles, RD   237 mL at 05/30/15 1409  . fluvoxaMINE (LUVOX) tablet 100 mg  100 mg Oral QHS Saramma Eappen, MD   100 mg at 05/29/15 2117  . ibuprofen (ADVIL,MOTRIN) tablet 400 mg  400 mg Oral Q6H PRN Ursula Alert, MD   400 mg at 05/30/15 1719  . loperamide (IMODIUM) capsule 2 mg  2 mg Oral PRN Kerrie Buffalo, NP      . loratadine (CLARITIN) tablet 10 mg  10 mg Oral Daily Ursula Alert, MD   10 mg at 05/30/15 0759  . magnesium hydroxide (MILK OF MAGNESIA) suspension 30 mL  30 mL Oral Daily PRN Patrecia Pour, NP      . megestrol (MEGACE) 400 MG/10ML suspension 400 mg  400 mg Oral Daily Saramma Eappen, MD   400 mg at 05/30/15 0800  . memantine (NAMENDA) tablet 10 mg  10 mg Oral BID Patrecia Pour, NP   10 mg at 05/30/15 1703  . metoprolol tartrate (LOPRESSOR) tablet 12.5 mg  12.5 mg Oral q morning - 10a Jonetta Osgood, MD   12.5 mg at 05/30/15 1024  . mirtazapine (REMERON SOL-TAB) disintegrating tablet 15 mg  15 mg Oral QHS Ursula Alert, MD   15 mg at 05/29/15 2117  . nystatin (MYCOSTATIN/NYSTOP) topical powder   Topical BID Ursula Alert, MD      . OLANZapine zydis (ZYPREXA) disintegrating tablet 20 mg  20 mg Oral QHS Ursula Alert, MD   20 mg at 05/29/15 2115  . pantoprazole (PROTONIX) EC tablet 40 mg  40 mg Oral Daily Patrecia Pour, NP   40 mg at 05/30/15 0759  . pravastatin (PRAVACHOL) tablet 20 mg  20 mg Oral QHS Patrecia Pour, NP   20 mg at 05/29/15 2116  . tamsulosin (FLOMAX) capsule 0.4 mg  0.4 mg Oral QPC supper Ursula Alert, MD   0.4 mg at 05/29/15 2116  . triazolam (HALCION) tablet  0.125 mg  0.125 mg Oral QHS Ursula Alert, MD   0.125 mg at 05/29/15 2116    Lab Results:  Results for orders placed or performed during the hospital encounter of 05/24/15 (from the past 48 hour(s))  Potassium     Status: None   Collection Time: 05/29/15  6:25 AM  Result Value Ref Range   Potassium 3.7 3.5 - 5.1 mmol/L    Comment: Performed at [facility]  Magnesium     Status: None   Collection Time: 05/29/15  6:25 AM  Result Value Ref Range   Magnesium 1.8 1.7 - 2.4 mg/dL    Comment: Performed at [facility]  TSH     Status: None   Collection Time: 05/29/15  6:25 AM  Result Value Ref Range   TSH 2.704 0.350 - 4.500 uIU/mL    Comment: Performed at [facility]    Physical Findings: AIMS: Facial and Oral Movements Muscles of Facial Expression: None, normal Lips and Perioral Area: None, normal Jaw: None, normal Tongue: None, normal,Extremity Movements Upper (arms, wrists, hands, fingers): None, normal Lower (legs, knees, ankles, toes): None, normal, Trunk Movements Neck, shoulders, hips: None, normal, Overall Severity Severity of abnormal movements (highest score from questions above): None, normal Incapacitation due to abnormal movements: None, normal Patient's awareness of abnormal movements (rate only patient's report): No Awareness, Dental Status Current problems with teeth and/or dentures?: No Does patient usually wear dentures?: No  CIWA:  CIWA-Ar Total: 2 COWS:  COWS Total Score: 3  Treatment Plan Summary: Daily contact with patient to  assess and evaluate symptoms and progress in treatment and Medication management Supportive approach/coping skills Insomnia; EKG is WNL. Will D/C the Zyprexa and initiate treatment with Seroquel She was recorded as using Seroquel 100 mg BID, will start with 200 mg hs tonight with a possibility of further increase in dose depending on response  Will continue the Halcion/Klonopin combination Depression; will continue to work with the Luvox, Remeron combination Medical Decision Making:  Review of Psycho-Social Stressors (1), Review of Medication Regimen & Side Effects (2) and Review of New Medication or Change in Dosage (2)     Tayari Yankee A 05/30/2015, 7:11 PM

## 2015-05-31 DIAGNOSIS — F333 Major depressive disorder, recurrent, severe with psychotic symptoms: Principal | ICD-10-CM

## 2015-05-31 MED ORDER — FLUVOXAMINE MALEATE 50 MG PO TABS
125.0000 mg | ORAL_TABLET | Freq: Every day | ORAL | Status: DC
  Administered 2015-05-31 – 2015-06-01 (×2): 125 mg via ORAL
  Filled 2015-05-31: qty 25
  Filled 2015-05-31 (×3): qty 3

## 2015-05-31 MED ORDER — QUETIAPINE FUMARATE 400 MG PO TABS
400.0000 mg | ORAL_TABLET | Freq: Every day | ORAL | Status: DC
  Administered 2015-05-31 – 2015-06-01 (×2): 400 mg via ORAL
  Filled 2015-05-31 (×3): qty 1
  Filled 2015-05-31: qty 10

## 2015-05-31 NOTE — Clinical Social Work Note (Signed)
Patient faxed out for ALF bed, under review at [facility], [place], [place] and [facility].  CSW spoke w sister, [name], explained placement process and stressed need to investigate Medicaid eligibility for SNF or ALF dementia care unit placement at some point in future.  At present, family wants to bring patient back home where she has care by niece from 25 - 5 Monday - Friday.  Lives in family compound type set up, family members are next door, bring food, check on her frequently.  Family aware that at some point, patient's care needs may exceed their ability to provide; however, want to bring her back home at discharge while investigating long term placement options.  CSW encouraged family to visit local SNF, contact DSS re Medicaid application.  CSW will also provide information packet to family w resources for caring for elderly family members.  Edwyna Shell, LCSW Clinical Social Worker

## 2015-05-31 NOTE — Progress Notes (Signed)
[facility] MD Progress Note  05/31/2015 2:33 PM Kendra Simmons  MRN:  [id number] Subjective:  Kendra Simmons states" I am hopeless and disappointed that I am not sleeping.'  Objective:Patient seen and chart reviewed.Discussed patient with treatment team.Kendra Simmons is a 64 y.o. Caucasian female who lives by self in [place], she is divorced , has three adult children, is on SSD, has a hx of depression as well as insomnia and dementia .Patient presented to [facility] with her sister due to the above concerns . Per initial notes in EHR -pt spoke to Dr.Arfeen - her out patient psychiatrist who felt she should come in to the hospital.    Pt today continues to endorse sleep issues - however per night staff pt has been sleeping 4-5 hrs every night and taking naps during day time.  Pt today with improved affect , but states she is still hopeless. Pt currently on seroquel - started by Dr.Lugo yday , pt reported she was on a higher dose in the past. Will increase the dose tonight to see if this will help. Pt denies SI/HI/AH/VH. Pt seen more on unit than on admission, seen attending groups. Pt also with tachycardia - periodic - hospitalist to continue to follow.    Principal Problem: MDD (major depressive disorder), recurrent, severe, with psychosis Diagnosis:   Patient Active Problem List   Diagnosis Date Noted  . Tachycardia [R00.0] 05/27/2015  . MDD (major depressive disorder), recurrent, severe, with psychosis [F33.3] 05/25/2015  . 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 Snoring [R06.83] 12/24/2014  . Unintended weight loss [R63.4] 12/24/2014  . 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 Panic attacks [F41.0] 07/26/2014  . Thrombocytopenia [D69.6] 12/17/2012  . DYSLIPIDEMIA [E78.5] 03/13/2010  . GERD [K21.9] 03/13/2010   Total Time spent with patient: 30  minutes   Past Medical History:  Past Medical History  Diagnosis Date  . High cholesterol   . Depression   . Anxiety   . GERD (gastroesophageal reflux disease)   . Insomnia   . Arthritis   . Memory loss   . Emotional depression 02/04/2015    Past Surgical History  Procedure Laterality Date  . Cosmetic surgery    . Cesarean section    . Laproscopy     Family History:  Family History  Problem Relation Age of Onset  . Sleep apnea Father   . Alcohol abuse Father   . Diabetes Mother   . Diabetes Sister   . Diabetes Maternal Uncle   . Diabetes Cousin    Social History:  History  Alcohol Use No    Comment: zero     History  Drug Use No    Comment: Denies any drug use other than benzos    History   Social History  . Marital Status: Divorced    Spouse Name: N/A  . Number of Children: 3  . Years of Education: 14   Occupational History  . Retired    Social History Main Topics  . Smoking status: Never Smoker   . Smokeless tobacco: Never Used  . Alcohol Use: No     Comment: zero  . Drug Use: No     Comment: Denies any drug use other than benzos  . Sexual Activity: No   Other Topics Concern  . None   Social History Narrative   Patient lives at home alone.   Caffeine Use:  2 cokes daily   Sister lives next door.   Additional History:    Sleep: Poor  Appetite:  Poor    Musculoskeletal: Strength & Muscle Tone: within normal limits Gait & Station: normal Patient leans: normal   Psychiatric Specialty Exam: Physical Exam  Review of Systems  Constitutional: Positive for malaise/fatigue.  HENT: Negative.   Eyes: Negative.   Respiratory: Negative.   Cardiovascular: Negative.   Gastrointestinal: Negative.   Genitourinary: Negative.   Musculoskeletal: Negative.   Skin: Negative.   Neurological: Negative.   Endo/Heme/Allergies: Negative.   Psychiatric/Behavioral: Positive for depression. The patient is nervous/anxious and has insomnia.   All other  systems reviewed and are negative.   Blood pressure 120/63, pulse 92, temperature 98.2 F (36.8 C), temperature source Oral, resp. rate 18, height 5' 4.5" (1.638 m), weight 63.957 kg (141 lb), SpO2 99 %.Body mass index is 23.84 kg/(m^2).  General Appearance: Fairly Groomed  Engineer, water::  Minimal  Speech:  Clear and Coherent and Slow  Volume:  Decreased  Mood:  Anxious and Depressed  Affect:  Restricted  Thought Process:  Coherent and Goal Directed  Orientation:  Full (Time, Place, and Person)  Thought Content:  symptoms envents worries concerns  Suicidal Thoughts:  No  Homicidal Thoughts:  No  Memory:  Immediate;   Fair Recent;   Fair Remote;   Fair  Judgement:  Fair  Insight:  Present and Shallow  Psychomotor Activity:  Decreased  Concentration:  Fair  Recall:  [employer] of Knowledge:Fair  Language: Fair  Akathisia:  No  Handed:  Right  AIMS (if indicated):     Assets:  Desire for Improvement  ADL's:  Intact  Cognition: WNL  Sleep:  Number of Hours: 5.75     Current Medications: Current Facility-Administered Medications  Medication Dose Route Frequency Provider Last Rate Last Dose  . alum & mag hydroxide-simeth (MAALOX/MYLANTA) 200-200-20 MG/5ML suspension 30 mL  30 mL Oral Q4H PRN Patrecia Pour, NP      . clonazePAM Bobbye [place]) tablet 1 mg  1 mg Oral QHS Ursula Alert, MD   1 mg at 05/30/15 2136  . feeding supplement (ENSURE ENLIVE) (ENSURE ENLIVE) liquid 237 mL  237 mL Oral BID BM Clayton Bibles, RD   237 mL at 05/31/15 1047  . fluvoxaMINE (LUVOX) tablet 125 mg  125 mg Oral QHS Marleen Moret, MD      . ibuprofen (ADVIL,MOTRIN) tablet 400 mg  400 mg Oral Q6H PRN Ursula Alert, MD   400 mg at 05/30/15 1719  . loperamide (IMODIUM) capsule 2 mg  2 mg Oral PRN Kerrie Buffalo, NP      . loratadine (CLARITIN) tablet 10 mg  10 mg Oral Daily Ursula Alert, MD   10 mg at 05/31/15 0756  . magnesium hydroxide (MILK OF MAGNESIA) suspension 30 mL  30 mL Oral Daily PRN Patrecia Pour, NP      . megestrol (MEGACE) 400 MG/10ML suspension 400 mg  400 mg Oral Daily Ursula Alert, MD   400 mg at 05/31/15 0756  . memantine (NAMENDA) tablet 10 mg  10 mg Oral BID Patrecia Pour, NP   10 mg at 05/31/15 0756  . metoprolol tartrate (LOPRESSOR) tablet 12.5 mg  12.5 mg Oral q morning - 10a Jonetta Osgood, MD   12.5 mg at 05/31/15 1041  . mirtazapine (REMERON SOL-TAB) disintegrating tablet 15 mg  15 mg Oral QHS Ursula Alert, MD   15 mg at 05/30/15 2136  .  nystatin (MYCOSTATIN/NYSTOP) topical powder   Topical BID Deondrea Aguado, MD      . pantoprazole (PROTONIX) EC tablet 40 mg  40 mg Oral Daily Patrecia Pour, NP   40 mg at 05/31/15 0756  . pravastatin (PRAVACHOL) tablet 20 mg  20 mg Oral QHS Patrecia Pour, NP   20 mg at 05/30/15 2136  . QUEtiapine (SEROQUEL) tablet 400 mg  400 mg Oral QHS Sulema Braid, MD      . tamsulosin (FLOMAX) capsule 0.4 mg  0.4 mg Oral QPC supper Ursula Alert, MD   0.4 mg at 05/30/15 2136  . triazolam (HALCION) tablet 0.125 mg  0.125 mg Oral QHS Ursula Alert, MD   0.125 mg at 05/30/15 2205    Lab Results:  No results found for this or any previous visit (from the past 48 hour(s)).  Physical Findings: AIMS: Facial and Oral Movements Muscles of Facial Expression: None, normal Lips and Perioral Area: None, normal Jaw: None, normal Tongue: None, normal,Extremity Movements Upper (arms, wrists, hands, fingers): None, normal Lower (legs, knees, ankles, toes): None, normal, Trunk Movements Neck, shoulders, hips: None, normal, Overall Severity Severity of abnormal movements (highest score from questions above): None, normal Incapacitation due to abnormal movements: None, normal Patient's awareness of abnormal movements (rate only patient's report): No Awareness, Dental Status Current problems with teeth and/or dentures?: No Does patient usually wear dentures?: No  CIWA:  CIWA-Ar Total: 2 COWS:  COWS Total Score: 3  Assessment: Patient is a 17  y old CF who has a hx of depression as well as chronic severe insomnia , dementia , today she continues to be depressed , hopeless . Hospitalist has been following her tachycardia . They will continue to follow. Will continue treatment.    Treatment Plan Summary: Daily contact with patient to assess and evaluate symptoms and progress in treatment and Medication management Supportive approach/coping skills  Will continue Remeron 15 mg po qhs - to help with sleep .Remeron is more sedating at lower doses. Will increase Luvox to 125 mg po qhs for depression. Will continue Klonopin 1 mg po qhs for anxiety sx. Will continue Halcion 0.125 mg po qhs for sleep. Will increase seroquel to 400 mg po qhs tonight for sleep. Started Megace 400 mg po daily for loss of appetite   Patient with tachycardia on and off - will monitor VS- EKG STAT if she is tachycardic and notify Hospitalist .Patient has refused echocardiogram.Hospitalist to follow. Pt had a tachycardic episode this AM. Aggie NP to notify hospitalist.  Will continue Namenda 10 mg bid po for dementia.  Will continue Pravachol 20 mg po daily for hyperlipidemia.  Will continue Protonix 40 mg po daily for GERD  Patient is on Falls precautions .  CSW will start working on disposition. Patient to be referred to ALF on discharge.  Patient to participate in therapeutic milieu .     Medical Decision Making:  Review of Psycho-Social Stressors (1), Review or order clinical lab tests (1), Review of Last Therapy Session (1), Review of Medication Regimen & Side Effects (2) and Review of New Medication or Change in Dosage (2)     Hamza Empson MD 05/31/2015, 2:33 PM

## 2015-05-31 NOTE — Progress Notes (Signed)
D: Pt denied any SI/HI/AVH. "I'm hopeless". Pt refused to eat any snacks this evening. " I had a big supper". Pt declined writer's offer again at the med window. Pt's HR standing was elevated. Writer manually measured pt's radial pulse for one full minute to receive a result of 96. Pt spent only a minimal amount of time outside of her room. Pt's primary concern was her sleep. Pt is hoping that the Seroquel will work. However, she notes how she was on 400 mg in the past. Pt was informed that 200 mg may work as her overall medication regimen is different.  A: Writer administered scheduled medications to pt, per MD orders. Continued support and availability as needed was extended to this pt. Staff continue to monitor pt with q35min checks.  R: No adverse drug reactions noted. Pt receptive to treatment. Pt remains safe at this time.

## 2015-05-31 NOTE — Progress Notes (Signed)
D: Pt presents with flat affect and depressed mood. Pt reported that she feels hopeless and depressed. Pt reported that she feels like her meds are not working for her. Pt reported that she is not sleeping well at bedtime. Pt denies AVH. Pt denies suicidal ideations. Pt has minimal interaction on the milieu and likes to isolate in her room. Pt encouraged to attend groups and have been compliant with attending this morning and noted to be participating. Pt tachycardia this morning, reported from previous shift. Heart rate rechecked and documented and EKG completed. Shea Evans, MD., made aware of results. Pt denied palpitations, dizziness or pain on assessment. Writer will continues to monitor patient.  A: Medications administered as ordered per MD. Verbal support given. Pt encouraged to attend groups. Writer have encouraged pt to increase fluid intake. 15 minute checks performed for safety. R: Pt receptive to treatment.

## 2015-05-31 NOTE — Progress Notes (Signed)
PATIENT DETAILS Name: Kendra Simmons Age: 64 y.o. Sex: female Date of Birth: January 31, 1951 Admit Date: 05/24/2015 Admitting Physician Ursula Alert, MD PCP:SUN,VYVYAN Y, MD  Subjective: Denies any palpitation, shortness of breath, dizziness , morning vital signs showing tachycardia 128 - 140 (EKG done in same time showing heart rate 97) , she denies any symptoms during this episode , she endorses poor sleep and insomnia (night staff reports she sleeps on average 5 hours every night), excessive coffee or caffeine in intake.  Assessment/Plan:  Sinus Tachycardia: TSH within normal limits.  patient does not appear to be clinically dehydrated ,Cautiously continue with metoprolol. Denies any symptoms during that episode, 2-D echo still pending(patient initially was refusing, currently agreeable to it), there appears to be a heart rate discrepancy between vital signs and EKG done around the same time, so would recommend obtaining another EKG immediately if she has significant tachycardia to confirm those findings, we will follow.  Hypokalemia:resolved  Depression/Anxiety: per primary service  No further recommendations today  MEDICATIONS: Scheduled Meds: . clonazePAM  1 mg Oral QHS  . feeding supplement (ENSURE ENLIVE)  237 mL Oral BID BM  . fluvoxaMINE  125 mg Oral QHS  . loratadine  10 mg Oral Daily  . megestrol  400 mg Oral Daily  . memantine  10 mg Oral BID  . metoprolol tartrate  12.5 mg Oral q morning - 10a  . mirtazapine  15 mg Oral QHS  . nystatin   Topical BID  . pantoprazole  40 mg Oral Daily  . pravastatin  20 mg Oral QHS  . QUEtiapine  400 mg Oral QHS  . tamsulosin  0.4 mg Oral QPC supper  . triazolam  0.125 mg Oral QHS   Continuous Infusions:  PRN Meds:.alum & mag hydroxide-simeth, ibuprofen, loperamide, magnesium hydroxide    PHYSICAL EXAM: Vital signs in last 24 hours: Filed Vitals:   05/31/15 0616 05/31/15 0715 05/31/15 0716 05/31/15 1040    BP: 93/68   120/63  Pulse: 141 123 128 92  Temp:      TempSrc:      Resp:      Height:      Weight:      SpO2:        Weight change:  Filed Weights   05/24/15 1621  Weight: 63.957 kg (141 lb)   Body mass index is 23.84 kg/(m^2).   Gen Exam: Awake and alert with clear speech. Very flat affect Neck: Supple, No JVD.   Chest: B/L Clear.   CVS: S1 S2 Regular, no murmurs.  Abdomen: soft, BS +, non tender, non distended.  Extremities: no edema, lower extremities warm to touch. Neurologic: Non Focal.   Skin: No Rash.   Wounds: N/A.    Intake/Output from previous day: No intake or output data in the 24 hours ending 05/31/15 1457   LAB RESULTS: CBC  Recent Labs Lab 05/27/15 2255  HGB 11.6*  HCT 34.0*    Chemistries   Recent Labs Lab 05/25/15 1934 05/26/15 2035 05/27/15 2255 05/29/15 0625  NA  --  143 144  --   K  --  3.5 2.9* 3.7  CL  --  110 107  --   CO2  --  25  --   --   GLUCOSE  --  127* 123*  --   BUN  --  21* 15  --   CREATININE  --  0.63 0.60  --   CALCIUM  --  9.5  --   --   MG 2.1  --   --  1.8    CBG: No results for input(s): GLUCAP in the last 168 hours.  GFR Estimated Creatinine Clearance: 62.7 mL/min (by C-G formula based on Cr of 0.6).  Coagulation profile No results for input(s): INR, PROTIME in the last 168 hours.  Cardiac Enzymes No results for input(s): CKMB, TROPONINI, MYOGLOBIN in the last 168 hours.  Invalid input(s): CK  Invalid input(s): POCBNP No results for input(s): DDIMER in the last 72 hours. No results for input(s): HGBA1C in the last 72 hours. No results for input(s): CHOL, HDL, LDLCALC, TRIG, CHOLHDL, LDLDIRECT in the last 72 hours.  Recent Labs  05/29/15 0625  TSH 2.704   No results for input(s): VITAMINB12, FOLATE, FERRITIN, TIBC, IRON, RETICCTPCT in the last 72 hours. No results for input(s): LIPASE, AMYLASE in the last 72 hours.  Urine Studies No results for input(s): UHGB, CRYS in the last 72  hours.  Invalid input(s): UACOL, UAPR, USPG, UPH, UTP, UGL, UKET, UBIL, UNIT, UROB, ULEU, UEPI, UWBC, URBC, UBAC, CAST, UCOM, BILUA  MICROBIOLOGY: No results found for this or any previous visit (from the past 240 hour(s)).  RADIOLOGY STUDIES/RESULTS: No results found.  Phillips Climes, MD  [facility] Pager:[phone]  If 7PM-7AM, please contact night-coverage [email or web address] Password [email or web address] 05/31/2015, 2:57 PM   LOS: 7 days

## 2015-05-31 NOTE — BHH Group Notes (Signed)
[place] LCSW Group Therapy  05/31/2015 1:15 pm  Type of Therapy: Process Group Therapy  Participation Level:  Active  Participation Quality:  Appropriate  Affect:  Flat  Cognitive:  Oriented  Insight:  Improving  Engagement in Group:  Limited  Engagement in Therapy:  Limited  Modes of Intervention:  Activity, Clarification, Education, Problem-solving and Support  Summary of Progress/Problems: Today's group addressed the issue of overcoming obstacles.  Patients were asked to identify their biggest obstacle post d/c that stands in the way of their on-going success, and then problem solve as to how to manage this.  Sat quietly with no spontaneous contributions.  When asked specifically about what causes emotions to build up, she went back to her theme of not getting enough sleep, which leads to "pressure building up."  Was encouraged to give herself credit for some of the things she is trying, and given examples.  She was receptive to this, and seemed pleased.  Roque Lias B 05/31/2015   10:46 AM

## 2015-05-31 NOTE — Progress Notes (Signed)
Recreation Therapy Notes  06.14.16 @ 1530 pm LRT went to meet with patient but patient was asleep.  LRT will follow with patient in the morning.   [name], LRT/CTRS    [name] A 05/31/2015 4:08 PM

## 2015-05-31 NOTE — Progress Notes (Signed)
Adult Psychoeducational Group Note  Date:  05/31/2015 Time:  0900 Group Topic/Focus:  Recovery Goals:   The focus of this group is to identify appropriate goals for recovery and establish a plan to achieve them.  Participation Level:  Active  Participation Quality:  Appropriate  Affect:  Appropriate  Cognitive:  Appropriate  Insight: Appropriate  Engagement in Group:  Engaged  Modes of Intervention:  Education  Additional Comments:    Marynell Bies L 05/31/2015, 10:54 AM

## 2015-06-01 LAB — CBC
HCT: 32.6 % — ABNORMAL LOW (ref 36.0–46.0)
Hemoglobin: 10.7 g/dL — ABNORMAL LOW (ref 12.0–15.0)
MCH: 30.5 pg (ref 26.0–34.0)
MCHC: 32.8 g/dL (ref 30.0–36.0)
MCV: 92.9 fL (ref 78.0–100.0)
PLATELETS: 120 10*3/uL — AB (ref 150–400)
RBC: 3.51 MIL/uL — ABNORMAL LOW (ref 3.87–5.11)
RDW: 14.4 % (ref 11.5–15.5)
WBC: 6.2 10*3/uL (ref 4.0–10.5)

## 2015-06-01 LAB — BASIC METABOLIC PANEL
Anion gap: 7 (ref 5–15)
BUN: 14 mg/dL (ref 6–20)
CHLORIDE: 109 mmol/L (ref 101–111)
CO2: 24 mmol/L (ref 22–32)
CREATININE: 0.63 mg/dL (ref 0.44–1.00)
Calcium: 9.2 mg/dL (ref 8.9–10.3)
GFR calc non Af Amer: >60 mL/min (ref >60)
Glucose, Bld: 101 mg/dL — ABNORMAL HIGH (ref 65–99)
Potassium: 3.5 mmol/L (ref 3.5–5.1)
SODIUM: 140 mmol/L (ref 135–145)

## 2015-06-01 MED ORDER — MIRTAZAPINE 7.5 MG PO TABS
7.5000 mg | ORAL_TABLET | Freq: Every day | ORAL | Status: DC
  Administered 2015-06-01: 7.5 mg via ORAL
  Filled 2015-06-01: qty 10
  Filled 2015-06-01 (×2): qty 1

## 2015-06-01 MED ORDER — TRIAZOLAM 0.125 MG PO TABS
0.2500 mg | ORAL_TABLET | Freq: Every day | ORAL | Status: DC
  Administered 2015-06-01: 0.25 mg via ORAL
  Filled 2015-06-01: qty 1
  Filled 2015-06-01: qty 2

## 2015-06-01 NOTE — Progress Notes (Addendum)
Manual Pulse at 0630 was 113. EKG taken. Normal sinus rhythm. Mr Frederico Hamman PA notified.

## 2015-06-01 NOTE — Progress Notes (Signed)
D: Pt presents with flat affect and depressed mood. Pt rates depression 8/10. Pt c/o not sleeping well at bedtime. Pt continues to isolate in her room and sleep during the day. Pt encouraged throughout the morning to attend groups. Pt compliant with attending morning groups. Pt appears disheveled. Pt compliant with taking meds.  A: Medications administered as ordered per MD. Verbal support given. Pt encouraged to attend groups. Pt encouraged to increase fluid intake. 15 minute checks performed for safety.  R: Pt safety maintained.

## 2015-06-01 NOTE — Tx Team (Signed)
Interdisciplinary Treatment Plan Update (Adult)  Date:  06/01/2015   Time Reviewed:  3:41 PM   Progress in Treatment: Attending groups: Yes. Participating in groups:  Yes. Taking medication as prescribed:  Yes. Tolerating medication:  Yes. Family/Significant othe contact made:  Yes Patient understands diagnosis:  Yes   Discussing patient identified problems/goals with staff:  Yes, see initial care plan. Medical problems stabilized or resolved:  Yes. Denies suicidal/homicidal ideation: Yes. Issues/concerns per patient self-inventory:  No. Other:  New problem(s) identified:  Discharge Plan or Barriers:  Return home, follow up outpt  Reason for Continuation of Hospitalization:   Comments:  Estimated length of stay:  Likely d/c tomorrow  New goal(s):  Review of initial/current patient goals per problem list:     Attendees: Patient:  06/01/2015 3:41 PM   Family:   06/01/2015 3:41 PM   Physician:  Ursula Alert, MD 06/01/2015 3:41 PM   Nursing:   Gaylan Gerold, RN 06/01/2015 3:41 PM   CSW:    Roque Lias, [place]   06/01/2015 3:41 PM   Other:  06/01/2015 3:41 PM   Other:   06/01/2015 3:41 PM   Other:  Lars Pinks, Nurse CM 06/01/2015 3:41 PM   Other:  Lucinda Dell, Beverly Sessions TCT 06/01/2015 3:41 PM   Other:  Norberto Sorenson, [place]  06/01/2015 3:41 PM   Other:  06/01/2015 3:41 PM   Other:  06/01/2015 3:41 PM   Other:  06/01/2015 3:41 PM   Other:  06/01/2015 3:41 PM   Other:  06/01/2015 3:41 PM   Other:   06/01/2015 3:41 PM    Scribe for Treatment Team:   Trish Mage, 06/01/2015 3:41 PM

## 2015-06-01 NOTE — Progress Notes (Signed)
D: Pt is alert and oriented x3. Pt. Pt endorses depression-hopelessness; she states, "I feel hopeless and depressed because I have not been sleeping". Pt is isolated and withdrawn to self, refused to go to group. She states, "I just don't feel like doing anything".  Pt was however calm and cooperative. V/S including manual pulse was taken.  R: Pt was offered support and encouragement. Pt was advised on the benefits of leaving the room, going to groups and mingling with peers. Pt was offered medication including her triazolam for sleep as prescribed. V/S WNL, pulse 88.  R: Pt verbalizes understanding. Accepted and took all meds, no complaint. 15 minute safety checks continues.

## 2015-06-01 NOTE — Progress Notes (Signed)
Adult Psychoeducational Group Note  Date:  06/01/2015 Time:  9:36 PM  Group Topic/Focus:  Wrap-Up Group:   The focus of this group is to help patients review their daily goal of treatment and discuss progress on daily workbooks.  Participation Level:  Minimal  Participation Quality:  mimimal  Affect:  Flat  Cognitive:  Oriented  Insight: Lacking  Engagement in Group:  Limited  Modes of Intervention:  Socialization and Support  Additional Comments:  Patient attended and participated in group tonight. She reports that she attended her groups today, she especially enjoyed the music group. She is concern about sleeping tonight.   Salley [facility] 06/01/2015, 9:36 PM

## 2015-06-01 NOTE — Progress Notes (Signed)
Recreation Therapy Notes  06.15.16 @ 0913 am LRT met with patient.  Patient was lying down and drowsy.  Patient stated she woke up twice during the night but thinks she slept the rest of the night.  LRT will follow with patient this afternoon.  [name], LRT/CTRS    06.15.16 @ 1315 LRT went to meet with patient but patient was in group.   [name], LRT/CTRS  Ria Comment, Amandalee Lacap A 06/01/2015 2:16 PM

## 2015-06-01 NOTE — Progress Notes (Signed)
[facility] MD Progress Note  06/01/2015 12:53 PM Kendra Simmons  MRN:  [id number] Subjective:  Kendra states" I am hopeless about my situation, I still cannot sleep.'   Objective:Patient seen and chart reviewed.Discussed patient with treatment team.Kendra Simmons is a 64 y.o. Caucasian female who lives by self in [place], she is divorced , has three adult children, is on SSD, has a hx of depression as well as insomnia and dementia .Patient presented to [facility] with her sister due to the above concerns . Per initial notes in EHR -pt spoke to Dr.Arfeen - her out patient psychiatrist who felt she should come in to the hospital.    Pt today continues to endorse sleep issues - however per night staff pt has been sleeping 4-6 hrs every night and taking naps during day time.  Pt today appears depressed due to her sleep situation. Pt denies SI/HI/AH/VH.Pt reports appetite as improved, states she has been eating more. Pt encouraged to take her medication, stay awake during day time as much as possible. Discussed regularizing her sleep wake cycle - and discussed sleep hygiene. Pt also with tachycardia - periodic - hospitalist to continue to follow.    Principal Problem: MDD (major depressive disorder), recurrent, severe, with psychosis Diagnosis:   Patient Active Problem List   Diagnosis Date Noted  . Tachycardia [R00.0] 05/27/2015  . MDD (major depressive disorder), recurrent, severe, with psychosis [F33.3] 05/25/2015  . 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 Snoring [R06.83] 12/24/2014  . Unintended weight loss [R63.4] 12/24/2014  . 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 Panic attacks [F41.0] 07/26/2014  . Thrombocytopenia [D69.6] 12/17/2012  . DYSLIPIDEMIA [E78.5] 03/13/2010  . GERD [K21.9] 03/13/2010   Total Time spent with patient: 30  minutes   Past Medical History:  Past Medical History  Diagnosis Date  . High cholesterol   . Depression   . Anxiety   . GERD (gastroesophageal reflux disease)   . Insomnia   . Arthritis   . Memory loss   . Emotional depression 02/04/2015    Past Surgical History  Procedure Laterality Date  . Cosmetic surgery    . Cesarean section    . Laproscopy     Family History:  Family History  Problem Relation Age of Onset  . Sleep apnea Father   . Alcohol abuse Father   . Diabetes Mother   . Diabetes Sister   . Diabetes Maternal Uncle   . Diabetes Cousin    Social History:  History  Alcohol Use No    Comment: zero     History  Drug Use No    Comment: Denies any drug use other than benzos    History   Social History  . Marital Status: Divorced    Spouse Name: N/A  . Number of Children: 3  . Years of Education: 14   Occupational History  . Retired    Social History Main Topics  . Smoking status: Never Smoker   . Smokeless tobacco: Never Used  . Alcohol Use: No     Comment: zero  . Drug Use: No     Comment: Denies any drug use other than benzos  . Sexual Activity: No   Other Topics Concern  . None   Social History Narrative   Patient lives at home alone.   Caffeine Use:  2 cokes daily   Sister lives next door.  Additional History:    Sleep: Poor  Appetite:  Fair    Musculoskeletal: Strength & Muscle Tone: within normal limits Gait & Station: normal Patient leans: normal   Psychiatric Specialty Exam: Physical Exam  Review of Systems  Constitutional: Positive for malaise/fatigue.  HENT: Negative.   Eyes: Negative.   Respiratory: Negative.   Cardiovascular: Negative.   Gastrointestinal: Negative.   Genitourinary: Negative.   Musculoskeletal: Negative.   Skin: Negative.   Neurological: Negative.   Endo/Heme/Allergies: Negative.   Psychiatric/Behavioral: Positive for depression. The patient is nervous/anxious and has insomnia.   All other  systems reviewed and are negative.   Blood pressure 100/51, pulse 91, temperature 98.5 F (36.9 C), temperature source Oral, resp. rate 16, height 5' 4.5" (1.638 m), weight 63.957 kg (141 lb), SpO2 99 %.Body mass index is 23.84 kg/(m^2).  General Appearance: Fairly Groomed  Engineer, water::  Minimal  Speech:  Clear and Coherent and Slow  Volume:  Decreased  Mood:  Anxious and Depressed  Affect:  Restricted  Thought Process:  Coherent and Goal Directed  Orientation:  Full (Time, Place, and Person)  Thought Content:  symptoms envents worries concerns  Suicidal Thoughts:  No  Homicidal Thoughts:  No  Memory:  Immediate;   Fair Recent;   Fair Remote;   Fair  Judgement:  Fair  Insight:  Present and Shallow  Psychomotor Activity:  Decreased  Concentration:  Fair  Recall:  [employer] of Knowledge:Fair  Language: Fair  Akathisia:  No  Handed:  Right  AIMS (if indicated):     Assets:  Desire for Improvement  ADL's:  Intact  Cognition: WNL  Sleep:  Number of Hours: 6:00 hrs     Current Medications: Current Facility-Administered Medications  Medication Dose Route Frequency Provider Last Rate Last Dose  . alum & mag hydroxide-simeth (MAALOX/MYLANTA) 200-200-20 MG/5ML suspension 30 mL  30 mL Oral Q4H PRN Patrecia Pour, NP      . clonazePAM Bobbye Charleston) tablet 1 mg  1 mg Oral QHS Ursula Alert, MD   1 mg at 05/31/15 2126  . feeding supplement (ENSURE ENLIVE) (ENSURE ENLIVE) liquid 237 mL  237 mL Oral BID BM Clayton Bibles, RD   237 mL at 06/01/15 1037  . fluvoxaMINE (LUVOX) tablet 125 mg  125 mg Oral QHS Ursula Alert, MD   125 mg at 05/31/15 2125  . ibuprofen (ADVIL,MOTRIN) tablet 400 mg  400 mg Oral Q6H PRN Ursula Alert, MD   400 mg at 06/01/15 0805  . loperamide (IMODIUM) capsule 2 mg  2 mg Oral PRN Kerrie Buffalo, NP      . loratadine (CLARITIN) tablet 10 mg  10 mg Oral Daily Ursula Alert, MD   10 mg at 06/01/15 0805  . magnesium hydroxide (MILK OF MAGNESIA) suspension 30 mL  30  mL Oral Daily PRN Patrecia Pour, NP      . megestrol (MEGACE) 400 MG/10ML suspension 400 mg  400 mg Oral Daily Ursula Alert, MD   400 mg at 06/01/15 0805  . memantine (NAMENDA) tablet 10 mg  10 mg Oral BID Patrecia Pour, NP   10 mg at 06/01/15 0805  . metoprolol tartrate (LOPRESSOR) tablet 12.5 mg  12.5 mg Oral q morning - 10a Jonetta Osgood, MD   12.5 mg at 06/01/15 1036  . mirtazapine (REMERON) tablet 7.5 mg  7.5 mg Oral QHS Nirel Babler, MD      . nystatin (MYCOSTATIN/NYSTOP) topical powder   Topical BID Ange Puskas,  MD      . pantoprazole (PROTONIX) EC tablet 40 mg  40 mg Oral Daily Patrecia Pour, NP   40 mg at 06/01/15 0805  . pravastatin (PRAVACHOL) tablet 20 mg  20 mg Oral QHS Patrecia Pour, NP   20 mg at 05/31/15 2125  . QUEtiapine (SEROQUEL) tablet 400 mg  400 mg Oral QHS Ursula Alert, MD   400 mg at 05/31/15 2125  . tamsulosin (FLOMAX) capsule 0.4 mg  0.4 mg Oral QPC supper Ursula Alert, MD   0.4 mg at 05/31/15 1652  . triazolam (HALCION) tablet 0.25 mg  0.25 mg Oral QHS Ursula Alert, MD        Lab Results:  Results for orders placed or performed during the hospital encounter of 05/24/15 (from the past 48 hour(s))  CBC     Status: Abnormal   Collection Time: 06/01/15  5:00 AM  Result Value Ref Range   WBC 6.2 4.0 - 10.5 K/uL   RBC 3.51 (L) 3.87 - 5.11 MIL/uL   Hemoglobin 10.7 (L) 12.0 - 15.0 g/dL   HCT 32.6 (L) 36.0 - 46.0 %   MCV 92.9 78.0 - 100.0 fL   MCH 30.5 26.0 - 34.0 pg   MCHC 32.8 30.0 - 36.0 g/dL   RDW 14.4 11.5 - 15.5 %   Platelets 120 (L) 150 - 400 K/uL    Comment: Performed at [facility] metabolic panel     Status: Abnormal   Collection Time: 06/01/15  7:05 AM  Result Value Ref Range   Sodium 140 135 - 145 mmol/L   Potassium 3.5 3.5 - 5.1 mmol/L   Chloride 109 101 - 111 mmol/L   CO2 24 22 - 32 mmol/L   Glucose, Bld 101 (H) 65 - 99 mg/dL   BUN 14 6 - 20 mg/dL   Creatinine, Ser 0.63 0.44 - 1.00 mg/dL   Calcium  9.2 8.9 - 10.3 mg/dL   GFR calc non Af Amer >60 >60 mL/min   GFR calc Af Amer >60 >60 mL/min    Comment: (NOTE) The eGFR has been calculated using the CKD EPI equation. This calculation has not been validated in all clinical situations. eGFR's persistently <60 mL/min signify possible Chronic Kidney Disease.    Anion gap 7 5 - 15    Comment: Performed at [facility]    Physical Findings: AIMS: Facial and Oral Movements Muscles of Facial Expression: None, normal Lips and Perioral Area: None, normal Jaw: None, normal Tongue: None, normal,Extremity Movements Upper (arms, wrists, hands, fingers): None, normal Lower (legs, knees, ankles, toes): None, normal, Trunk Movements Neck, shoulders, hips: None, normal, Overall Severity Severity of abnormal movements (highest score from questions above): None, normal Incapacitation due to abnormal movements: None, normal Patient's awareness of abnormal movements (rate only patient's report): No Awareness, Dental Status Current problems with teeth and/or dentures?: No Does patient usually wear dentures?: No  CIWA:  CIWA-Ar Total: 2 COWS:  COWS Total Score: 3  Assessment: Patient is a 30 y old CF who has a hx of depression as well as chronic severe insomnia , dementia , today she continues to be depressed , hopeless . Hospitalist has been following her tachycardia . They will continue to follow. Will continue treatment.    Treatment Plan Summary: Daily contact with patient to assess and evaluate symptoms and progress in treatment and Medication management Supportive approach/coping skills  Will reduce Remeron to 7.5 mg po qhs for  sleep,depression. Will continue Luvox  125 mg po qhs for depression. Will continue Klonopin 1 mg po qhs for anxiety sx. Will increase  Halcion to 0.25 mg po qhs for sleep. Will continue seroquel 400 mg po qhs tonight for sleep. Started Megace 400 mg po daily for loss of appetite   Patient with  tachycardia on and off - Per hospitalist pt to follow up with PCP - no other recommendations at this time.  Will continue Namenda 10 mg bid po for dementia.  Will continue Pravachol 20 mg po daily for hyperlipidemia.  Will continue Protonix 40 mg po daily for GERD  Patient is on Falls precautions .  CSW will start working on disposition.  Patient to participate in therapeutic milieu .     Medical Decision Making:  Review of Psycho-Social Stressors (1), Review or order clinical lab tests (1), Review of Last Therapy Session (1), Review of Medication Regimen & Side Effects (2) and Review of New Medication or Change in Dosage (2)     Cleon Thoma MD 06/01/2015, 12:53 PM

## 2015-06-01 NOTE — BHH Group Notes (Signed)
[facility] Mental Health Association Group Therapy  06/01/2015 , 1:50 PM    Type of Therapy:  Mental Health Association Presentation  Participation Level:  Active  Participation Quality:  Attentive  Affect:  Blunted  Cognitive:  Oriented  Insight:  Limited  Engagement in Therapy:  Engaged  Modes of Intervention:  Discussion, Education and Socialization  Summary of Progress/Problems:  Shanon Brow from [employer] came to present his recovery story and play the guitar.  Sat quietly.  Was awake for the entire time.  Roque Lias B 06/01/2015 , 1:50 PM

## 2015-06-01 NOTE — BHH Group Notes (Signed)
[facility] LCSW Aftercare Discharge Planning Group Note   06/01/2015 1:47 PM  Participation Quality:  Minimal  Mood/Affect:  Flat  Depression Rating:    Anxiety Rating:    Thoughts of Suicide:  No Will you contract for safety?   NA  Current AVH:  Denies  Plan for Discharge/Comments:  Somatic complaints.  NOt enough sleep.  When told likely d/c soon, she responded "I don't think I am ready to go yet."  Told her to follow up with Dr about concerns  [employer]: sister  Supports: family  [place], Ernestine Mcmurray

## 2015-06-01 NOTE — Progress Notes (Addendum)
PATIENT DETAILS Name: Kendra Simmons Age: 64 y.o. Sex: female Date of Birth: [birth year] Admit Date: 05/24/2015 Admitting Physician Ursula Alert, MD PCP:SUN,VYVYAN Y, MD  Subjective: Denies any palpitation. Lying in bed comfortably  Assessment/Plan:  Sinus Tachycardia: Episodic sinus tachycardia-not reconfirmed on immediate EKG. Not sure if this is a true reading or a machine error. In any event she is asymptomatic, refuses a 2-D echocardiogra no further Only recommendation at this time is to do EKG when she appears tachycardic-if it persistently shows sinus tachycardia or any sort of arrhythmia please reconsult the hospitalist service. Otherwise no further recommendations.  Anemia: Has become gradually anemic over the past few days. However no overt blood loss. Denies any melanotic hematochezia. Not sure if this is contributing to her sinus tachycardia as it appears mostly episodic. Please continue to check CBC, if hemoglobin continues to drop, please duly consult hospitalist service  Depression/Anxiety: per primary service  Hospitalist service will sign off-please see above.  MEDICATIONS: Scheduled Meds: . clonazePAM  1 mg Oral QHS  . feeding supplement (ENSURE ENLIVE)  237 mL Oral BID BM  . fluvoxaMINE  125 mg Oral QHS  . loratadine  10 mg Oral Daily  . megestrol  400 mg Oral Daily  . memantine  10 mg Oral BID  . metoprolol tartrate  12.5 mg Oral q morning - 10a  . mirtazapine  7.5 mg Oral QHS  . nystatin   Topical BID  . pantoprazole  40 mg Oral Daily  . pravastatin  20 mg Oral QHS  . QUEtiapine  400 mg Oral QHS  . tamsulosin  0.4 mg Oral QPC supper  . triazolam  0.25 mg Oral QHS   Continuous Infusions:  PRN Meds:.alum & mag hydroxide-simeth, ibuprofen, loperamide, magnesium hydroxide    PHYSICAL EXAM: Vital signs in last 24 hours: Filed Vitals:   05/31/15 2157 06/01/15 0610 06/01/15 0611 06/01/15 1007  BP: 118/62 94/61 90/47  100/51  Pulse: 88  121 130 91  Temp: 98.7 F (37.1 C) 98.5 F (36.9 C)    TempSrc: Oral     Resp: 19 16    Height:      Weight:      SpO2: 99%       Weight change:  Filed Weights   05/24/15 1621  Weight: 63.957 kg (141 lb)   Body mass index is 23.84 kg/(m^2).   Gen Exam: Awake and alert with clear speech. Very flat affect Neck: Supple, No JVD.   Chest: B/L Clear.   CVS: S1 S2 Regular, no murmurs.  Abdomen: soft, BS +, non tender, non distended.  Extremities: no edema, lower extremities warm to touch. Neurologic: Non Focal.   Skin: No Rash.   Wounds: N/A.    Intake/Output from previous day: No intake or output data in the 24 hours ending 06/01/15 1226   LAB RESULTS: CBC  Recent Labs Lab 05/27/15 2255 06/01/15 0500  WBC  --  6.2  HGB 11.6* 10.7*  HCT 34.0* 32.6*  PLT  --  120*  MCV  --  92.9  MCH  --  30.5  MCHC  --  32.8  RDW  --  14.4    Chemistries   Recent Labs Lab 05/25/15 1934 05/26/15 2035 05/27/15 2255 05/29/15 0625 06/01/15 0705  NA  --  143 144  --  140  K  --  3.5 2.9* 3.7 3.5  CL  --  110 107  --  109  CO2  --  25  --   --  24  GLUCOSE  --  127* 123*  --  101*  BUN  --  21* 15  --  14  CREATININE  --  0.63 0.60  --  0.63  CALCIUM  --  9.5  --   --  9.2  MG 2.1  --   --  1.8  --     CBG: No results for input(s): GLUCAP in the last 168 hours.  GFR Estimated Creatinine Clearance: 62.7 mL/min (by C-G formula based on Cr of 0.63).  Coagulation profile No results for input(s): INR, PROTIME in the last 168 hours.  Cardiac Enzymes No results for input(s): CKMB, TROPONINI, MYOGLOBIN in the last 168 hours.  Invalid input(s): CK  Invalid input(s): POCBNP No results for input(s): DDIMER in the last 72 hours. No results for input(s): HGBA1C in the last 72 hours. No results for input(s): CHOL, HDL, LDLCALC, TRIG, CHOLHDL, LDLDIRECT in the last 72 hours. No results for input(s): TSH, T4TOTAL, T3FREE, THYROIDAB in the last 72 hours.  Invalid input(s):  FREET3 No results for input(s): VITAMINB12, FOLATE, FERRITIN, TIBC, IRON, RETICCTPCT in the last 72 hours. No results for input(s): LIPASE, AMYLASE in the last 72 hours.  Urine Studies No results for input(s): UHGB, CRYS in the last 72 hours.  Invalid input(s): UACOL, UAPR, USPG, UPH, UTP, UGL, UKET, UBIL, UNIT, UROB, ULEU, UEPI, UWBC, URBC, UBAC, CAST, UCOM, BILUA  MICROBIOLOGY: No results found for this or any previous visit (from the past 240 hour(s)).  RADIOLOGY STUDIES/RESULTS: No results found.  Oren Binet, MD  [facility] TXHFS:[phone]  If 7PM-7AM, please contact night-coverage [email or web address] Password [email or web address] 06/01/2015, 12:26 PM   LOS: 8 days

## 2015-06-01 NOTE — Progress Notes (Signed)
Patient ID: Kendra Simmons, female   DOB: Aug 15, 1951, 64 y.o.   MRN: [id number] PER STATE REGULATIONS 482.30  THIS CHART WAS REVIEWED FOR MEDICAL NECESSITY WITH RESPECT TO THE PATIENT'S ADMISSION/ DURATION OF STAY.  NEXT REVIEW DATE: 06/05/2015  Chauncy Lean, RN, BSN CASE MANAGER

## 2015-06-01 NOTE — Plan of Care (Signed)
Problem: Ineffective individual coping Goal: LTG: Patient will report a decrease in negative feelings Outcome: Progressing Pt will continue to verbalize reduced depression.

## 2015-06-02 ENCOUNTER — Encounter (HOSPITAL_COMMUNITY): Payer: Self-pay | Admitting: Registered Nurse

## 2015-06-02 MED ORDER — MEGESTROL ACETATE 400 MG/10ML PO SUSP: 400.0000 mg | Freq: Every day | ORAL | Status: DC

## 2015-06-02 MED ORDER — TAMSULOSIN HCL 0.4 MG PO CAPS: 0.4000 mg | ORAL_CAPSULE | Freq: Every day | ORAL | Status: DC

## 2015-06-02 MED ORDER — CLONAZEPAM 0.5 MG PO TABS: 0.5000 mg | ORAL_TABLET | Freq: Every day | ORAL | Status: DC

## 2015-06-02 MED ORDER — MIRTAZAPINE 7.5 MG PO TABS: 7.5000 mg | ORAL_TABLET | Freq: Every day | ORAL | Status: DC

## 2015-06-02 MED ORDER — FLUVOXAMINE MALEATE 25 MG PO TABS: 125.0000 mg | ORAL_TABLET | Freq: Every day | ORAL | Status: DC

## 2015-06-02 MED ORDER — MEMANTINE HCL 10 MG PO TABS: 10.0000 mg | ORAL_TABLET | Freq: Two times a day (BID) | ORAL | Status: DC

## 2015-06-02 MED ORDER — OMEPRAZOLE 20 MG PO CPDR: 20.0000 mg | DELAYED_RELEASE_CAPSULE | Freq: Every day | ORAL | Status: DC

## 2015-06-02 MED ORDER — METOPROLOL TARTRATE 25 MG PO TABS: 12.5000 mg | ORAL_TABLET | Freq: Every morning | ORAL | Status: DC

## 2015-06-02 MED ORDER — LORATADINE 10 MG PO TABS: 10.0000 mg | ORAL_TABLET | Freq: Every day | ORAL | Status: DC

## 2015-06-02 MED ORDER — NYSTATIN 100000 UNIT/GM EX POWD: CUTANEOUS | Status: DC

## 2015-06-02 MED ORDER — QUETIAPINE FUMARATE 400 MG PO TABS: 400.0000 mg | ORAL_TABLET | Freq: Every day | ORAL | Status: DC

## 2015-06-02 MED ORDER — PRAVASTATIN SODIUM 20 MG PO TABS: 20.0000 mg | ORAL_TABLET | Freq: Every day | ORAL | Status: DC

## 2015-06-02 MED ORDER — TRIAZOLAM 0.25 MG PO TABS: 0.2500 mg | ORAL_TABLET | Freq: Every day | ORAL | Status: DC

## 2015-06-02 NOTE — Progress Notes (Addendum)
D:  Patient denied SI and HI, contracts for safety.   Denied A/V hallucinations.  Denied pain. A:  Medications administered per MD orders, except for nystatin powder which she declined.  Emotional support and encouragement given patient. R:  Safety maintained with 15 minute checks. Patient stated she is not sure if she is ready to be discharged.

## 2015-06-02 NOTE — Progress Notes (Signed)
  [facility] Adult Case Management Discharge Plan :  Will you be returning to the same living situation after discharge:  Yes,  home At discharge, do you have transportation home?: Yes,  sister Do you have the ability to pay for your medications: Yes,  MCR  Release of information consent forms completed and in the chart;  Patient's signature needed at discharge.  Patient to Follow up at: Follow-up Information    Follow up with [name], MD. Call today.   Specialty:  Family Medicine   Contact information:   [place] [address] [phone]       Follow up with [facility] On 06/13/2015.   Why:  Monday at 10:30 with Dr Emeline Darling information:   [address]  [facility]  [phone]      Patient denies SI/HI: Yes,  yes    Safety Planning and Suicide Prevention discussed: Yes,  yes  Have you used any form of tobacco in the last 30 days? (Cigarettes, Smokeless Tobacco, Cigars, and/or Pipes): No  Has patient been referred to the Quitline?: N/A patient is not a smoker  [place] B 06/02/2015, 9:28 AM

## 2015-06-02 NOTE — [facility] Group Notes (Signed)
The focus of this group is to educate the patient on the purpose and policies of crisis stabilization and provide a format to answer questions about their admission.  The group details unit policies and expectations of patients while admitted.  Patient did not attend 0900 nurse education orientation group this morning.  Patient stayed in bed sleeping.    

## 2015-06-02 NOTE — Progress Notes (Signed)
D: Pt wouldn't engage the Probation officer in conversation. Pt continued to be focused on her meds, asking if the halcion was in, then asking if she could receive her meds earlier in the shift. Pt admits to having A/h, stating that they are "murmurs". Pt slept all night without incident.  A:  Support and encouragement was offered. 15 min checks continued for safety.  R: Pt remains safe.
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
## 2015-06-02 NOTE — Progress Notes (Signed)
Discharge Note:  Patient discharged home with sister.  Denied SI and HI.  Denied A/V hallucinations.  Denied pain.  Suicide prevention information given and discussed with patient who stated she understood and had no questions.  Patient stated she received all her belongings, clothing, toiletries, misc items, prescriptions, medications, shorts,  Pocketbook, wallet, cards, money, etc.  Patient stated she appreciated all assistant received from [facility] staff.

## 2015-06-02 NOTE — Progress Notes (Signed)
Pt's pulse was 86 manually. Pt refused to go to the cafeteria.

## 2015-06-02 NOTE — [facility]HH Suicide Risk Assessment (Signed)
[facility] Discharge Suicide Risk Assessment   Demographic Factors:  Divorced or widowed  Total Time spent with patient: 30 minutes  Musculoskeletal: Strength & Muscle Tone: within normal limits Gait & Station: normal Patient leans: N/A  Psychiatric Specialty Exam: Physical Exam  Review of Systems  Psychiatric/Behavioral: Positive for depression (STABLE). The patient has insomnia (BUT PATIENT OBSERVED AS SNORING IN BED OFTEN ).   All other systems reviewed and are negative.   Blood pressure 95/75, pulse 86, temperature 98.7 F (37.1 C), temperature source Oral, resp. rate 20, height 5' 4.5" (1.638 m), weight 63.957 kg (141 lb), SpO2 99 %.Body mass index is 23.84 kg/(m^2).  General Appearance: Fairly Groomed  Engineer, water::  Fair  Speech:  Normal [id number]  Volume:  Decreased  Mood:  Depressed  Affect:  Restricted  Thought Process:  Coherent  Orientation:  Other:  To person , place and year  Thought Content:  WDL  Suicidal Thoughts:  No  Homicidal Thoughts:  No  Memory:  Immediate;   Fair Recent;   Fair Remote;   Fair  Judgement:  Fair  Insight:  Shallow  Psychomotor Activity:  Decreased  Concentration:  Fair  Recall:  [employer] of Knowledge:Fair  Language: Fair  Akathisia:  No  Handed:  Right  AIMS (if indicated):     Assets:  Social Support  Sleep:  Number of Hours: 6.75  Cognition: WNL  ADL's:  Intact   Have you used any form of tobacco in the last 30 days? (Cigarettes, Smokeless Tobacco, Cigars, and/or Pipes): No  Has this patient used any form of tobacco in the last 30 days? (Cigarettes, Smokeless Tobacco, Cigars, and/or Pipes) No  Mental Status Per Nursing Assessment::   On Admission:  Suicidal ideation indicated by patient  Current Mental Status by Physician: Patient presented with depression , insomnia, loss of appetite. Pt was started on medications to help her sleep, pt was observed by staff as sleeping at night as well as day time . However pt conitnued to  report problems with sleep and feeling hopeless about her situation. Initially it was decided to send pt to an ALF , however family decided to give another trial at home. Per out patient provider Dr.Arfeen , pt has problems with sleep perception rather than pure insomnia . This was verified per observation on out unit during this admission. Pt today denies SI/HI/AH/VH.  Loss Factors: Decline in physical health  Historical Factors: NA  Risk Reduction Factors:   Positive social support  Continued Clinical Symptoms:  Previous Psychiatric Diagnoses and Treatments Medical Diagnoses and Treatments/Surgeries  Cognitive Features That Contribute To Risk:  Closed-mindedness    Suicide Risk:  Minimal: No identifiable suicidal ideation.  Patients presenting with no risk factors but with morbid ruminations; may be classified as minimal risk based on the severity of the depressive symptoms  Principal Problem: MDD (major depressive disorder), recurrent, severe, with psychosis Discharge Diagnoses:  Patient Active Problem List   Diagnosis Date Noted  . Tachycardia [R00.0] 05/27/2015  . MDD (major depressive disorder), recurrent, severe, with psychosis [F33.3] 05/25/2015  . 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 Snoring [R06.83] 12/24/2014  . Unintended weight loss [R63.4] 12/24/2014  . 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 Panic attacks [F41.0] 07/26/2014  . Thrombocytopenia [D69.6] 12/17/2012  . DYSLIPIDEMIA [E78.5] 03/13/2010  . GERD [K21.9] 03/13/2010    Follow-up Information  Follow up with [name], MD. Call today.   Specialty:  Family Medicine   Contact information:   [address] [place] [address] [phone]       Follow up with [facility] On 06/13/2015.   Why:  Monday at 10:30 with Dr Emeline Darling information:   [address]  [facility]  [[phone]] [place] recommendations:  Activity:  No restrictions Diet:  regular Tests:  Could get a sleep study , out patient provider could refer her for another sleep study , since the last one was inconclusive. Other:  follow up with PCP for tachycardia - per hospitalist - it is sinus tachycardia - no concerns at this time, pt refused echocardiogram.  Is patient on multiple antipsychotic therapies at discharge:  No   Has Patient had three or more failed trials of antipsychotic monotherapy by history:  No  Recommended Plan for Multiple Antipsychotic Therapies: NA    Ahlivia Salahuddin MD 06/02/2015, 9:28 AM

## 2015-06-06 ENCOUNTER — Other Ambulatory Visit: Payer: Self-pay

## 2015-06-06 ENCOUNTER — Emergency Department (HOSPITAL_COMMUNITY)
Admission: EM | Admit: 2015-06-06 | Discharge: 2015-06-06 | Disposition: A | Discharge status: Home / Self Care | Payer: Medicare Other | Attending: Emergency Medicine | Admitting: Emergency Medicine

## 2015-06-06 ENCOUNTER — Encounter (HOSPITAL_COMMUNITY): Payer: Self-pay

## 2015-06-06 DIAGNOSIS — E780 Pure hypercholesterolemia: Secondary | ICD-10-CM | POA: Insufficient documentation

## 2015-06-06 DIAGNOSIS — Y9389 Activity, other specified: Secondary | ICD-10-CM | POA: Insufficient documentation

## 2015-06-06 DIAGNOSIS — G4700 Insomnia, unspecified: Secondary | ICD-10-CM | POA: Insufficient documentation

## 2015-06-06 DIAGNOSIS — Z043 Encounter for examination and observation following other accident: Secondary | ICD-10-CM | POA: Diagnosis not present

## 2015-06-06 DIAGNOSIS — F329 Major depressive disorder, single episode, unspecified: Secondary | ICD-10-CM | POA: Diagnosis not present

## 2015-06-06 DIAGNOSIS — Z79899 Other long term (current) drug therapy: Secondary | ICD-10-CM | POA: Diagnosis not present

## 2015-06-06 DIAGNOSIS — F419 Anxiety disorder, unspecified: Secondary | ICD-10-CM | POA: Diagnosis not present

## 2015-06-06 DIAGNOSIS — M1990 Unspecified osteoarthritis, unspecified site: Secondary | ICD-10-CM | POA: Insufficient documentation

## 2015-06-06 DIAGNOSIS — Y998 Other external cause status: Secondary | ICD-10-CM | POA: Insufficient documentation

## 2015-06-06 DIAGNOSIS — I951 Orthostatic hypotension: Secondary | ICD-10-CM | POA: Insufficient documentation

## 2015-06-06 DIAGNOSIS — Y9289 Other specified places as the place of occurrence of the external cause: Secondary | ICD-10-CM | POA: Diagnosis not present

## 2015-06-06 DIAGNOSIS — R55 Syncope and collapse: Secondary | ICD-10-CM | POA: Diagnosis present

## 2015-06-06 DIAGNOSIS — K219 Gastro-esophageal reflux disease without esophagitis: Secondary | ICD-10-CM | POA: Diagnosis not present

## 2015-06-06 LAB — CBC
HCT: 42.4 % (ref 36.0–46.0)
HEMOGLOBIN: 13.8 g/dL (ref 12.0–15.0)
MCH: 30.7 pg (ref 26.0–34.0)
MCHC: 32.5 g/dL (ref 30.0–36.0)
MCV: 94.2 fL (ref 78.0–100.0)
Platelets: 191 10*3/uL (ref 150–400)
RBC: 4.5 MIL/uL (ref 3.87–5.11)
RDW: 13.9 % (ref 11.5–15.5)
WBC: 9.4 10*3/uL (ref 4.0–10.5)

## 2015-06-06 LAB — URINE MICROSCOPIC-ADD ON

## 2015-06-06 LAB — URINALYSIS, ROUTINE W REFLEX MICROSCOPIC
BILIRUBIN URINE: NEGATIVE
Glucose, UA: NEGATIVE mg/dL
Hgb urine dipstick: NEGATIVE
Ketones, ur: NEGATIVE mg/dL
Nitrite: NEGATIVE
PROTEIN: NEGATIVE mg/dL
Specific Gravity, Urine: 1.015 (ref 1.005–1.030)
UROBILINOGEN UA: 1 mg/dL (ref 0.0–1.0)
pH: 7 (ref 5.0–8.0)

## 2015-06-06 LAB — BASIC METABOLIC PANEL
ANION GAP: 8 (ref 5–15)
BUN: 17 mg/dL (ref 6–20)
CALCIUM: 9.7 mg/dL (ref 8.9–10.3)
CO2: 24 mmol/L (ref 22–32)
Chloride: 107 mmol/L (ref 101–111)
Creatinine, Ser: 0.79 mg/dL (ref 0.44–1.00)
GFR calc Af Amer: >60 mL/min (ref >60)
GFR calc non Af Amer: >60 mL/min (ref >60)
Glucose, Bld: 136 mg/dL — ABNORMAL HIGH (ref 65–99)
POTASSIUM: 4.1 mmol/L (ref 3.5–5.1)
SODIUM: 139 mmol/L (ref 135–145)

## 2015-06-06 MED ORDER — SODIUM CHLORIDE 0.9 % IV BOLUS (SEPSIS)
1000.0000 mL | Freq: Once | INTRAVENOUS | Status: AC
  Administered 2015-06-06: 1000 mL via INTRAVENOUS

## 2015-06-06 NOTE — ED Notes (Signed)
Pt alert and oriented x4. Respirations even and unlabored, bilateral symmetrical rise and fall of chest. Skin warm and dry. In no acute distress. Denies needs.   

## 2015-06-06 NOTE — ED Provider Notes (Signed)
CSN: [id number]     Arrival date & time 06/06/15  1356 History   First MD Initiated Contact with Patient 06/06/15 1803     Chief Complaint  Patient presents with  . Fall  . Near Syncope     (Consider location/radiation/quality/duration/timing/severity/associated sxs/prior Treatment) HPI   PCP: [name], MD Blood pressure 132/67, pulse 65, temperature 98.2 F (36.8 C), temperature source Oral, resp. rate 16, SpO2 96 %.  Kendra Simmons is a 64 y.o.female with a significant PMH of high cholesterol, depression, anxiety, GERD, insomnia, arthritis, memory loss, emotional depression presents to the ER with complaints of near syncope 3. Patient is brought in by family members with concerns of her having near syncopal episodes. She was recently discharged from behavioral health where they discontinued 3 over medications and started her on 6 medications including blood pressure medication. The family cannot remember which medicines were discontinued and was started but at her visit to behavioral health last week she was sent over to [facility] for dehydration, tachycardia and hypokalemia.  Patient does not eat and drink well due to her depression. They report significant decrease in fluid intake and has had only one episode of urinating which was this morning. The patient felt lightheaded prior to falling but otherwise feels well. The family members deny that she hit her head or have loss of consciousness.   The patient denies diaphoresis, fever, headache, weakness ( focal), confusion, change of vision,  neck pain, dysphagia, aphagia, chest pain, shortness of breath,  back pain, abdominal pains, nausea, vomiting, diarrhea, lower extremity swelling, rash.   Past Medical History  Diagnosis Date  . High cholesterol   . Depression   . Anxiety   . GERD (gastroesophageal reflux disease)   . Insomnia   . Arthritis   . Memory loss   . Emotional depression 02/04/2015   Past Surgical History   Procedure Laterality Date  . Cosmetic surgery    . Cesarean section    . Laproscopy     Family History  Problem Relation Age of Onset  . Sleep apnea Father   . Alcohol abuse Father   . Diabetes Mother   . Diabetes Sister   . Diabetes Maternal Uncle   . Diabetes Cousin    History  Substance Use Topics  . Smoking status: Never Smoker   . Smokeless tobacco: Never Used  . Alcohol Use: No     Comment: zero   OB History    No data available     Review of Systems  10 Systems reviewed and are negative for acute change except as noted in the HPI.    Allergies  Review of patient's allergies indicates no known allergies.  Home Medications   Prior to Admission medications   Medication Sig Start Date End Date Taking? Authorizing Provider  acetaminophen (TYLENOL) 500 MG tablet Take 1,000 mg by mouth every 6 (six) hours as needed for mild pain or headache.   Yes Historical Provider, MD  clonazePAM (KLONOPIN) 0.5 MG tablet Take 1 tablet (0.5 mg total) by mouth at bedtime. 06/02/15  Yes Shuvon B Rankin, NP  fluvoxaMINE (LUVOX) 25 MG tablet Take 5 tablets (125 mg total) by mouth at bedtime. 06/02/15  Yes Shuvon B Rankin, NP  ibuprofen (ADVIL,MOTRIN) 200 MG tablet Take 800 mg by mouth every 6 (six) hours as needed.   Yes Historical Provider, MD  loratadine (CLARITIN) 10 MG tablet Take 1 tablet (10 mg total) by mouth daily. 06/02/15  Yes  Shuvon B Rankin, NP  megestrol (MEGACE) 400 MG/10ML suspension Take 10 mLs (400 mg total) by mouth daily. 06/02/15  Yes Shuvon B Rankin, NP  memantine (NAMENDA) 10 MG tablet Take 1 tablet (10 mg total) by mouth 2 (two) times daily. 06/02/15  Yes Shuvon B Rankin, NP  metoprolol tartrate (LOPRESSOR) 25 MG tablet Take 0.5 tablets (12.5 mg total) by mouth every morning. 06/02/15  Yes Shuvon B Rankin, NP  mirtazapine (REMERON) 7.5 MG tablet Take 1 tablet (7.5 mg total) by mouth at bedtime. 06/02/15  Yes Shuvon B Rankin, NP  nystatin (MYCOSTATIN/NYSTOP) 100000 UNIT/GM  POWD Apply topically 2 times daily 06/02/15  Yes Shuvon B Rankin, NP  omeprazole (PRILOSEC) 20 MG capsule Take 1 capsule (20 mg total) by mouth daily. For acid reflux 06/02/15  Yes Shuvon B Rankin, NP  pravastatin (PRAVACHOL) 20 MG tablet Take 1 tablet (20 mg total) by mouth at bedtime. For high cholesterol 06/02/15  Yes Shuvon B Rankin, NP  QUEtiapine (SEROQUEL) 400 MG tablet Take 1 tablet (400 mg total) by mouth at bedtime. 06/02/15  Yes Shuvon B Rankin, NP  tamsulosin (FLOMAX) 0.4 MG CAPS capsule Take 1 capsule (0.4 mg total) by mouth daily after supper. For frequent urination/urgency 06/02/15  Yes Shuvon B Rankin, NP  triazolam (HALCION) 0.25 MG tablet Take 1 tablet (0.25 mg total) by mouth at bedtime. 06/02/15  Yes Shuvon B Rankin, NP  cyanocobalamin (,VITAMIN B-12,) 1000 MCG/ML injection Inject 1 mL (1,000 mcg total) into the skin once a week. For bone health Patient not taking: Reported on 06/06/2015 12/20/14   Encarnacion Slates, NP   BP 157/77 mmHg  Pulse 71  Temp(Src) 98.2 F (36.8 C) (Oral)  Resp 16  SpO2 99% Physical Exam  Constitutional: She appears well-developed and well-nourished. No distress.  HENT:  Head: Normocephalic and atraumatic.  Eyes: Conjunctivae and EOM are normal. Pupils are equal, round, and reactive to light.  Neck: Normal range of motion. Neck supple.  Cardiovascular: Normal rate and regular rhythm.   Pulmonary/Chest: Effort normal and breath sounds normal.  Abdominal: Soft. Bowel sounds are normal. There is no tenderness.  Neurological: She is alert.  Cranial nerves II-VIII and X-XII evaluated and show no deficits. Pt alert and oriented x 3 Upper and lower extremity strength is symmetrical and physiologic Normal muscular tone No facial droop Coordination intact, no limb ataxia, finger-nose-finger normal Rapid alternating movements normal No pronator drift  Skin: Skin is warm and dry.  Psychiatric: Her mood appears not anxious. She exhibits a depressed mood.   Nursing note and vitals reviewed.   ED Course  Procedures (including critical care time) Labs Review Labs Reviewed  BASIC METABOLIC PANEL - Abnormal; Notable for the following:    Glucose, Bld 136 (*)    All other components within normal limits  URINALYSIS, ROUTINE W REFLEX MICROSCOPIC (NOT AT [facility]) - Abnormal; Notable for the following:    APPearance CLOUDY (*)    Leukocytes, UA SMALL (*)    All other components within normal limits  URINE MICROSCOPIC-ADD ON - Abnormal; Notable for the following:    Squamous Epithelial / LPF FEW (*)    All other components within normal limits  CBC    Imaging Review No results found.   EKG Interpretation None      MDM   Final diagnoses:  Orthostatic hypotension    Orthostatic VS - Lying from 06/05/15 1926 to 06/06/15 1926    Date/Time   06/06/15 1823   BP-  Lying: 124/76 mmHg   Pulse- Lying: 84   Who: JTE     Orthostatic VS - Sitting from 06/05/15 1926 to 06/06/15 1926    Date/Time   06/06/15 1823   BP- Sitting: 95/68 mmHg   Pulse- Sitting: 87   Who: JTE     Orthostatic VS - Standing from 06/05/15 1926 to 06/06/15 1926    Date/Time   06/06/15 1823   BP- Standing at 0 minutes: 102/62 mmHg   Pulse- Standing at 0 minutes: 120   Who: JTE        Treatment Plan Summary: at visit from a few days ago taken from patients chart. Daily contact with patient to assess and evaluate symptoms and progress in treatment and Medication management Supportive approach/coping skills  Will reduce Remeron to 7.5 mg po qhs for sleep,depression. Will continue Luvox  125 mg po qhs for depression. Will continue Klonopin 1 mg po qhs for anxiety sx. Will increase  Halcion to 0.25 mg po qhs for sleep. Will continue seroquel 400 mg po qhs tonight for sleep. Started Megace 400 mg po daily for loss of appetite  Patient with tachycardia on and off - Per hospitalist pt to follow up with PCP - no other recommendations at this  time. Will continue Namenda 10 mg bid po for dementia. Will continue Pravachol 20 mg po daily for hyperlipidemia. Will continue Protonix 40 mg po daily for GERD Patient is on Falls precautions . CSW will start working on disposition. Patient to participate in therapeutic milieu .    Patient rehydrated and is up and walking in the department without any near syncopal episodes, dizziness. She was able to pass urine here in the emergency department and her labs are unremarkable without any electrolyte abnormalities. She is not tachycardic or hypotensive any longer. Believe her symptoms were due to dehydration because of lack of by mouth intake. I discussed the patient's care with Dr. Eulis Foster who has gone to see the patient and agrees that she is safe for discharge. At this time we will not make any alterations to her current medication regimen as the patient will need more time to adjust her medications and follow-up with a psychiatrist or primary care doctor.  Medications  sodium chloride 0.9 % bolus 1,000 mL (1,000 mLs Intravenous New Bag/Given 06/06/15 1938)  sodium chloride 0.9 % bolus 1,000 mL (0 mLs Intravenous Stopped 06/06/15 1939)    64 y.o.Amrie L Litsey's evaluation in the Emergency Department is complete. It has been determined that no acute conditions requiring further emergency intervention are present at this time. The patient/guardian have been advised of the diagnosis and plan. We have discussed signs and symptoms that warrant return to the ED, such as changes or worsening in symptoms.  Vital signs are stable at discharge. Filed Vitals:   06/06/15 2002  BP: 157/77  Pulse: 71  Temp:   Resp:     Patient/guardian has voiced understanding and agreed to follow-up with the PCP or specialist.    Delos Haring, PA-C 06/06/15 2014  Daleen Bo, MD 06/09/15 [phone]

## 2015-06-06 NOTE — Discharge Instructions (Signed)
Near-Syncope Near-syncope (commonly known as near fainting) is sudden weakness, dizziness, or feeling like you might pass out. During an episode of near-syncope, you may also develop pale skin, have tunnel vision, or feel sick to your stomach (nauseous). Near-syncope may occur when getting up after sitting or while standing for a long time. It is caused by a sudden decrease in blood flow to the brain. This decrease can result from various causes or triggers, most of which are not serious. However, because near-syncope can sometimes be a sign of something serious, a medical evaluation is required. The specific cause is often not determined. HOME CARE INSTRUCTIONS  Monitor your condition for any changes. The following actions may help to alleviate any discomfort you are experiencing:  Have someone stay with you until you feel stable.  Lie down right away and prop your feet up if you start feeling like you might faint. Breathe deeply and steadily. Wait until all the symptoms have passed. Most of these episodes last only a few minutes. You may feel tired for several hours.   Drink enough fluids to keep your urine clear or pale yellow.   If you are taking blood pressure or heart medicine, get up slowly when seated or lying down. Take several minutes to sit and then stand. This can reduce dizziness.  Follow up with your health care provider as directed. SEEK IMMEDIATE MEDICAL CARE IF:   You have a severe headache.   You have unusual pain in the chest, abdomen, or back.   You are bleeding from the mouth or rectum, or you have black or tarry stool.   You have an irregular or very fast heartbeat.   You have repeated fainting or have seizure-like jerking during an episode.   You faint when sitting or lying down.   You have confusion.   You have difficulty walking.   You have severe weakness.   You have vision problems.  MAKE SURE YOU:   Understand these instructions.  Will  watch your condition.  Will get help right away if you are not doing well or get worse. Document Released: 12/03/2005 Document Revised: 12/08/2013 Document Reviewed: 05/08/2013 [facility] Patient Information 2015 [place], [place]. This information is not intended to replace advice given to you by your health care provider. Make sure you discuss any questions you have with your health care provider.  Orthostatic Hypotension Orthostatic hypotension is a sudden drop in blood pressure. It happens when you quickly stand up from a seated or lying position. You may feel dizzy or light-headed. This can last for just a few seconds or for up to a few minutes. It is usually not a serious problem. However, if this happens frequently or gets worse, it can be a sign of something more serious. CAUSES  Different things can cause orthostatic hypotension, including:   Loss of body fluids (dehydration).  Medicines that lower blood pressure.  Sudden changes in posture, such as standing up quickly after you have been sitting or lying down.  Taking too much of your medicine. SIGNS AND SYMPTOMS   Light-headedness or dizziness.   Fainting or near-fainting.   A fast heart rate.   Weakness.   Feeling tired (fatigue).  DIAGNOSIS  Your health care provider may do several things to help diagnose your condition and identify the cause. These may include:   Taking a medical history and doing a physical exam.  Checking your blood pressure. Your health care provider will check your blood pressure when you  are:  Lying down.  Sitting.  Standing.  Using tilt table testing. In this test, you lie down on a table that moves from a lying position to a standing position. You will be strapped onto the table. This test monitors your blood pressure and heart rate when you are in different positions. TREATMENT  Treatment will vary depending on the cause. Possible treatments include:   Changing the dosage of your  medicines.  Wearing compression stockings on your lower legs.  Standing up slowly after sitting or lying down.  Eating more salt.  Eating frequent, small meals.  In some cases, getting IV fluids.  Taking medicine to enhance fluid retention. HOME CARE INSTRUCTIONS  Only take over-the-counter or prescription medicines as directed by your health care provider.  Follow your health care provider's instructions for changing the dosage of your current medicines.  Do not stop or adjust your medicine on your own.  Stand up slowly after sitting or lying down. This allows your body to adjust to the different position.  Wear compression stockings as directed.  Eat extra salt as directed.  Do not add extra salt to your diet unless directed to by your health care provider.  Eat frequent, small meals.  Avoid standing suddenly after eating.  Avoid hot showers or excessive heat as directed by your health care provider.  Keep all follow-up appointments. SEEK MEDICAL CARE IF:  You continue to feel dizzy or light-headed after standing.  You feel groggy or confused.  You feel cold, clammy, or sick to your stomach (nauseous).  You have blurred vision.  You feel short of breath. SEEK IMMEDIATE MEDICAL CARE IF:   You faint after standing.  You have chest pain.  You have difficulty breathing.   You lose feeling or movement in your arms or legs.   You have slurred speech or difficulty talking, or you are unable to talk.  MAKE SURE YOU:   Understand these instructions.  Will watch your condition.  Will get help right away if you are not doing well or get worse. Document Released: 11/23/2002 Document Revised: 12/08/2013 Document Reviewed: 09/25/2013 [facility] Patient Information 2015 [place], [place]. This information is not intended to replace advice given to you by your health care provider. Make sure you discuss any questions you have with your health care  provider. Dehydration, Adult Dehydration is when you lose more fluids from the body than you take in. Vital organs like the kidneys, brain, and heart cannot function without a proper amount of fluids and salt. Any loss of fluids from the body can cause dehydration.  CAUSES   Vomiting.  Diarrhea.  Excessive sweating.  Excessive urine output.  Fever. SYMPTOMS  Mild dehydration  Thirst.  Dry lips.  Slightly dry mouth. Moderate dehydration  Very dry mouth.  Sunken eyes.  Skin does not bounce back quickly when lightly pinched and released.  Dark urine and decreased urine production.  Decreased tear production.  Headache. Severe dehydration  Very dry mouth.  Extreme thirst.  Rapid, weak pulse (more than 100 beats per minute at rest).  Cold hands and feet.  Not able to sweat in spite of heat and temperature.  Rapid breathing.  Blue lips.  Confusion and lethargy.  Difficulty being awakened.  Minimal urine production.  No tears. DIAGNOSIS  Your caregiver will diagnose dehydration based on your symptoms and your exam. Blood and urine tests will help confirm the diagnosis. The diagnostic evaluation should also identify the cause of dehydration. TREATMENT  Treatment of mild or moderate dehydration can often be done at home by increasing the amount of fluids that you drink. It is best to drink small amounts of fluid more often. Drinking too much at one time can make vomiting worse. Refer to the home care instructions below. Severe dehydration needs to be treated at the hospital where you will probably be given intravenous (IV) fluids that contain water and electrolytes. HOME CARE INSTRUCTIONS   Ask your caregiver about specific rehydration instructions.  Drink enough fluids to keep your urine clear or pale yellow.  Drink small amounts frequently if you have nausea and vomiting.  Eat as you normally do.  Avoid:  Foods or drinks high in sugar.  Carbonated  drinks.  Juice.  Extremely hot or cold fluids.  Drinks with caffeine.  Fatty, greasy foods.  Alcohol.  Tobacco.  Overeating.  Gelatin desserts.  Wash your hands well to avoid spreading bacteria and viruses.  Only take over-the-counter or prescription medicines for pain, discomfort, or fever as directed by your caregiver.  Ask your caregiver if you should continue all prescribed and over-the-counter medicines.  Keep all follow-up appointments with your caregiver. SEEK MEDICAL CARE IF:  You have abdominal pain and it increases or stays in one area (localizes).  You have a rash, stiff neck, or severe headache.  You are irritable, sleepy, or difficult to awaken.  You are weak, dizzy, or extremely thirsty. SEEK IMMEDIATE MEDICAL CARE IF:   You are unable to keep fluids down or you get worse despite treatment.  You have frequent episodes of vomiting or diarrhea.  You have blood or green matter (bile) in your vomit.  You have blood in your stool or your stool looks black and tarry.  You have not urinated in 6 to 8 hours, or you have only urinated a small amount of very dark urine.  You have a fever.  You faint. MAKE SURE YOU:   Understand these instructions.  Will watch your condition.  Will get help right away if you are not doing well or get worse. Document Released: 12/03/2005 Document Revised: 02/25/2012 Document Reviewed: 07/23/2011 [facility] Patient Information 2015 [place], [place]. This information is not intended to replace advice given to you by your health care provider. Make sure you discuss any questions you have with your health care provider.

## 2015-06-06 NOTE — ED Notes (Signed)
Pt c/o slipping and falling x 3 this morning.  Sts "I thought that I was going to pass out."  Denies pain.  Family reports a recent change in psych medications.

## 2015-06-06 NOTE — ED Provider Notes (Signed)
  Face-to-face evaluation   History: Patient here for evaluation of weakness, and near syncope. She was recently hospitalized in the psychiatric facility, and had several medication changes. She's not been eating well recently.    Physical exam: Alert patient who appears older than stated age. Mouth mildly dry. Moves arms and legs easily.  Medical screening examination/treatment/procedure(s) were conducted as a shared visit with non-physician practitioner(s) and myself.  I personally evaluated the patient during the encounter  Daleen Bo, MD 06/09/15 [phone]

## 2015-06-06 NOTE — ED Notes (Signed)
Pt ambulatory to bathroom with tech.

## 2015-06-06 NOTE — ED Notes (Signed)
PA at bedside.

## 2015-06-07 DIAGNOSIS — F329 Major depressive disorder, single episode, unspecified: Secondary | ICD-10-CM | POA: Diagnosis not present

## 2015-06-07 DIAGNOSIS — S60222A Contusion of left hand, initial encounter: Secondary | ICD-10-CM | POA: Diagnosis not present

## 2015-06-07 DIAGNOSIS — R630 Anorexia: Secondary | ICD-10-CM | POA: Diagnosis not present

## 2015-06-07 DIAGNOSIS — I959 Hypotension, unspecified: Secondary | ICD-10-CM | POA: Diagnosis not present

## 2015-06-07 DIAGNOSIS — F5105 Insomnia due to other mental disorder: Secondary | ICD-10-CM | POA: Diagnosis not present

## 2015-06-13 ENCOUNTER — Ambulatory Visit (INDEPENDENT_AMBULATORY_CARE_PROVIDER_SITE_OTHER): Payer: Medicare Other | Admitting: Psychiatry

## 2015-06-13 ENCOUNTER — Encounter (HOSPITAL_COMMUNITY): Payer: Self-pay | Admitting: Psychiatry

## 2015-06-13 VITALS — BP 134/83 | HR 118 | Ht 62.6 in | Wt 143.8 lb

## 2015-06-13 DIAGNOSIS — F332 Major depressive disorder, recurrent severe without psychotic features: Secondary | ICD-10-CM

## 2015-06-13 DIAGNOSIS — F09 Unspecified mental disorder due to known physiological condition: Secondary | ICD-10-CM

## 2015-06-13 DIAGNOSIS — F4310 Post-traumatic stress disorder, unspecified: Secondary | ICD-10-CM

## 2015-06-13 DIAGNOSIS — F330 Major depressive disorder, recurrent, mild: Secondary | ICD-10-CM

## 2015-06-13 MED ORDER — CITALOPRAM HYDROBROMIDE 10 MG PO TABS
10.0000 mg | ORAL_TABLET | Freq: Every day | ORAL | Status: DC
Start: 2015-06-13 — End: 2015-07-06

## 2015-06-13 MED ORDER — MIRTAZAPINE 15 MG PO TABS: 15.0000 mg | ORAL_TABLET | Freq: Every day | ORAL | Status: DC

## 2015-06-13 MED ORDER — QUETIAPINE FUMARATE 400 MG PO TABS: 400.0000 mg | ORAL_TABLET | Freq: Every day | ORAL | Status: DC

## 2015-06-13 NOTE — Progress Notes (Signed)
[facility] [phone] Progress Note   Kendra Simmons [id number] 64 y.o.  06/13/2015 2:10 PM  Chief Complaint:  I was admitted in the hospital.  I cannot sleep.  I think my insomnia is causing depression.      History of Present Illness:  Kendra Simmons came for her follow-up appointment with her sister and her niece.  She was admitted to behavioral [facility] 2 weeks ago.  She was admitted because of depression, unable to sleep, racing thoughts and her family is concerned that she cannot take care of herself.  Her biggest complaint was insomnia.  Though she denies any suicidal thoughts but admitted feeling hopeless helpless and worthless.  During hospitalization herRemeronwasreduced her Zyprexa was discontinued and she was given Seroquel 400 mg and she was continued on Klonopin.  She was also given Halcion to help her sleep.  She was also started her on Luvox to help her anxiety symptoms.  After discharge from the hospital on 16 patient had a fall which could be syncopal and require emergency room visit.  Her Lopressor was discontinued.  Patient is still take multiple medication including amantadine, do benzodiazepine, fluoxetine, Seroquel, Remeron which may be contributing to her dizziness and fall.  Patient continued to feel tired, lack of energy and poor sleep.  However during hospitalization and recently her family member does not believe she has insomnia.  She was noticed sleeping all night without any problem.  We have discussion about memory impairment and her perception about insomnia.  Patient get very upset and does not believe she has memory impairment.  We have encouraged her to see neurologist on a regular basis.  Now patient believe her previous medicine were helping her more .  She reported her sleep was better when she was taking increased doses of Remeron .  She does not believe fluoxetine is helping her anxiety and sleep.  She still have nightmares and flashback.  We also talk  about assisted living facility versus skilled nursing facility but patient and her family like to defer this for now .  Patient denies any agitation, anger, mood swing.  Her affect remains flat.  Her ADLs remains very limited.  She admitted being afraid living alone and her family member are living with her.  Patient denies any crying spells, feeling of hopelessness or worthlessness.  She admitted lately poor appetite and she had lost weight from the past.  However patient denies any aggression , paranoia or any suicidal thoughts or homicidal thought.    Suicidal Ideation: No Plan Formed: No Patient has means to carry out plan: No  Homicidal Ideation: No Plan Formed: No Patient has means to carry out plan: No  Past Psychiatric History/Hospitalization(s) Patient has multiple hospitalization. Her last hospitalization was June 2016 .  She has done one intensive outpatient program.  She has a history of hallucination, paranoia, severe depression. In the past she has seen [name], [name] and [name].  As per chart she has taken in the past doxepin, Xanax, Seroquel, amitriptyline, imipramine, temazepam , trazodone , Zyprexa, Bellsomra, Luvox, Wellbutrin, Prozac and Vistaril.     Anxiety: Yes Bipolar Disorder: No Depression: Yes Mania: No Psychosis: History of hallucinations Schizophrenia: No Personality Disorder: No Hospitalization for psychiatric illness: Yes History of Electroconvulsive Shock Therapy: No Prior Suicide Attempts: No  Medical History; Patient has high cholesterol, memory impairment, GERD and palpitation.  She has history of frequent falls and seen by a neurologist in the past.  Her  primary care physician is [name] in [place].  Review of Systems  Constitutional: Positive for weight loss and malaise/fatigue.  Eyes: Negative for blurred vision.  Respiratory: Negative.   Cardiovascular: Negative for chest pain and palpitations.  Musculoskeletal: Negative.    Neurological: Positive for dizziness. Negative for tremors and headaches.  Psychiatric/Behavioral: Positive for memory loss. The patient has insomnia.    Psychiatric: Agitation: No Hallucination: No Depressed Mood: No Insomnia: Yes Hypersomnia: No Altered Concentration: No Feels Worthless: No Grandiose Ideas: No Belief In Special Powers: No New/Increased Substance Abuse: No Compulsions: No  Neurologic: Headache: No Seizure: No Paresthesias: Yes   Musculoskeletal: Strength & Muscle Tone: within normal limits Gait & Station: normal Patient leans: N/A   Outpatient Encounter Prescriptions as of 06/13/2015  Medication Sig  . acetaminophen (TYLENOL) 500 MG tablet Take 1,000 mg by mouth every 6 (six) hours as needed for mild pain or headache.  . citalopram (CELEXA) 10 MG tablet Take 1 tablet (10 mg total) by mouth daily.  . clonazePAM (KLONOPIN) 0.5 MG tablet Take 1 tablet (0.5 mg total) by mouth at bedtime.  . cyanocobalamin (,VITAMIN B-12,) 1000 MCG/ML injection Inject 1 mL (1,000 mcg total) into the skin once a week. For bone health (Patient not taking: Reported on 06/06/2015)  . ibuprofen (ADVIL,MOTRIN) 200 MG tablet Take 800 mg by mouth every 6 (six) hours as needed.  . loratadine (CLARITIN) 10 MG tablet Take 1 tablet (10 mg total) by mouth daily.  . megestrol (MEGACE) 400 MG/10ML suspension Take 10 mLs (400 mg total) by mouth daily.  . memantine (NAMENDA) 10 MG tablet Take 1 tablet (10 mg total) by mouth 2 (two) times daily.  . mirtazapine (REMERON) 15 MG tablet Take 1 tablet (15 mg total) by mouth at bedtime.  [name] nystatin (MYCOSTATIN/NYSTOP) 100000 UNIT/GM POWD Apply topically 2 times daily  . omeprazole (PRILOSEC) 20 MG capsule Take 1 capsule (20 mg total) by mouth daily. For acid reflux  . pravastatin (PRAVACHOL) 20 MG tablet Take 1 tablet (20 mg total) by mouth at bedtime. For high cholesterol  . QUEtiapine (SEROQUEL) 400 MG tablet Take 1 tablet (400 mg total) by mouth at  bedtime.  . tamsulosin (FLOMAX) 0.4 MG CAPS capsule Take 1 capsule (0.4 mg total) by mouth daily after supper. For frequent urination/urgency  . [DISCONTINUED] fluvoxaMINE (LUVOX) 25 MG tablet Take 5 tablets (125 mg total) by mouth at bedtime.  . [DISCONTINUED] metoprolol tartrate (LOPRESSOR) 25 MG tablet Take 0.5 tablets (12.5 mg total) by mouth every morning.  . [DISCONTINUED] mirtazapine (REMERON) 7.5 MG tablet Take 1 tablet (7.5 mg total) by mouth at bedtime.  . [DISCONTINUED] QUEtiapine (SEROQUEL) 400 MG tablet Take 1 tablet (400 mg total) by mouth at bedtime.  . [DISCONTINUED] triazolam (HALCION) 0.25 MG tablet Take 1 tablet (0.25 mg total) by mouth at bedtime.   No facility-administered encounter medications on file as of 06/13/2015.    Recent Results (from the past 2160 hour(s))  Acetaminophen level     Status: Abnormal   Collection Time: 05/23/15  3:08 PM  Result Value Ref Range   Acetaminophen (Tylenol), Serum <10 (L) 10 - 30 ug/mL    Comment:        THERAPEUTIC CONCENTRATIONS VARY SIGNIFICANTLY. A RANGE OF 10-30 ug/mL MAY BE AN EFFECTIVE CONCENTRATION FOR MANY PATIENTS. HOWEVER, SOME ARE BEST TREATED AT CONCENTRATIONS OUTSIDE THIS RANGE. ACETAMINOPHEN CONCENTRATIONS >150 ug/mL AT 4 HOURS AFTER INGESTION AND >50 ug/mL AT 12 HOURS AFTER INGESTION ARE OFTEN  ASSOCIATED WITH TOXIC REACTIONS.   CBC     Status: None   Collection Time: 05/23/15  3:08 PM  Result Value Ref Range   WBC 7.7 4.0 - 10.5 K/uL   RBC 4.47 3.87 - 5.11 MIL/uL   Hemoglobin 14.0 12.0 - 15.0 g/dL   HCT 41.0 36.0 - 46.0 %   MCV 91.7 78.0 - 100.0 fL   MCH 31.3 26.0 - 34.0 pg   MCHC 34.1 30.0 - 36.0 g/dL   RDW 14.4 11.5 - 15.5 %   Platelets 185 150 - 400 K/uL  Comprehensive metabolic panel     Status: Abnormal   Collection Time: 05/23/15  3:08 PM  Result Value Ref Range   Sodium 143 135 - 145 mmol/L   Potassium 2.8 (L) 3.5 - 5.1 mmol/L   Chloride 108 101 - 111 mmol/L   CO2 22 22 - 32 mmol/L    Glucose, Bld 140 (H) 65 - 99 mg/dL   BUN 13 6 - 20 mg/dL   Creatinine, Ser 0.84 0.44 - 1.00 mg/dL   Calcium 9.8 8.9 - 10.3 mg/dL   Total Protein 7.5 6.5 - 8.1 g/dL   Albumin 4.5 3.5 - 5.0 g/dL   AST 25 15 - 41 U/L   ALT 12 (L) 14 - 54 U/L   Alkaline Phosphatase 43 38 - 126 U/L   Total Bilirubin 0.6 0.3 - 1.2 mg/dL   GFR calc non Af Amer >60 >60 mL/min   GFR calc Af Amer >60 >60 mL/min    Comment: (NOTE) The eGFR has been calculated using the CKD EPI equation. This calculation has not been validated in all clinical situations. eGFR's persistently <60 mL/min signify possible Chronic Kidney Disease.    Anion gap 13 5 - 15  Ethanol (ETOH)     Status: None   Collection Time: 05/23/15  3:08 PM  Result Value Ref Range   Alcohol, Ethyl (B) <5 <5 mg/dL    Comment:        LOWEST DETECTABLE LIMIT FOR SERUM ALCOHOL IS 11 mg/dL FOR MEDICAL PURPOSES ONLY   Salicylate level     Status: None   Collection Time: 05/23/15  3:08 PM  Result Value Ref Range   Salicylate Lvl <1.0 2.8 - 30.0 mg/dL  Urine rapid drug screen (hosp performed)not at [facility]     Status: None   Collection Time: 05/23/15  4:57 PM  Result Value Ref Range   Opiates NONE DETECTED NONE DETECTED   Cocaine NONE DETECTED NONE DETECTED   Benzodiazepines NONE DETECTED NONE DETECTED   Amphetamines NONE DETECTED NONE DETECTED   Tetrahydrocannabinol NONE DETECTED NONE DETECTED   Barbiturates NONE DETECTED NONE DETECTED    Comment:        DRUG SCREEN FOR MEDICAL PURPOSES ONLY.  IF CONFIRMATION IS NEEDED FOR ANY PURPOSE, NOTIFY LAB WITHIN 5 DAYS.        LOWEST DETECTABLE LIMITS FOR URINE DRUG SCREEN Drug Class       Cutoff (ng/mL) Amphetamine      1000 Barbiturate      200 Benzodiazepine   626 Tricyclics       948 Opiates          300 Cocaine          300 THC              50   Potassium     Status: Abnormal   Collection Time: 05/24/15  6:24 AM  Result Value Ref Range  Potassium 3.0 (L) 3.5 - 5.1 mmol/L  Potassium      Status: Abnormal   Collection Time: 05/24/15 10:38 AM  Result Value Ref Range   Potassium 3.3 (L) 3.5 - 5.1 mmol/L  Urinalysis, Routine w reflex microscopic (not at [facility])     Status: Abnormal   Collection Time: 05/24/15  2:23 PM  Result Value Ref Range   Color, Urine AMBER (A) YELLOW    Comment: BIOCHEMICALS MAY BE AFFECTED BY COLOR   APPearance CLEAR CLEAR   Specific Gravity, Urine 1.029 1.005 - 1.030   pH 5.5 5.0 - 8.0   Glucose, UA NEGATIVE NEGATIVE mg/dL   Hgb urine dipstick NEGATIVE NEGATIVE   Bilirubin Urine SMALL (A) NEGATIVE   Ketones, ur 15 (A) NEGATIVE mg/dL   Protein, ur NEGATIVE NEGATIVE mg/dL   Urobilinogen, UA 1.0 0.0 - 1.0 mg/dL   Nitrite NEGATIVE NEGATIVE   Leukocytes, UA NEGATIVE NEGATIVE    Comment: MICROSCOPIC NOT DONE ON URINES WITH NEGATIVE PROTEIN, BLOOD, LEUKOCYTES, NITRITE, OR GLUCOSE <1000 mg/dL.  Magnesium     Status: None   Collection Time: 05/25/15  7:34 PM  Result Value Ref Range   Magnesium 2.1 1.7 - 2.4 mg/dL    Comment: Performed at [facility]  Prolactin     Status: Abnormal   Collection Time: 05/25/15  7:34 PM  Result Value Ref Range   Prolactin 50.3 (H) 4.8 - 23.3 ng/mL    Comment: (NOTE) Performed At: [facility] [place], [place] [id number] Lindon Romp MD HA:[id number] Performed at [facility] metabolic panel     Status: Abnormal   Collection Time: 05/26/15  8:35 PM  Result Value Ref Range   Sodium 143 135 - 145 mmol/L   Potassium 3.5 3.5 - 5.1 mmol/L   Chloride 110 101 - 111 mmol/L   CO2 25 22 - 32 mmol/L   Glucose, Bld 127 (H) 65 - 99 mg/dL   BUN 21 (H) 6 - 20 mg/dL   Creatinine, Ser 0.63 0.44 - 1.00 mg/dL   Calcium 9.5 8.9 - 10.3 mg/dL   GFR calc non Af Amer >60 >60 mL/min   GFR calc Af Amer >60 >60 mL/min    Comment: (NOTE) The eGFR has been calculated using the CKD EPI equation. This calculation has not been validated in all clinical  situations. eGFR's persistently <60 mL/min signify possible Chronic Kidney Disease.    Anion gap 8 5 - 15    Comment: Performed at [facility]     Status: Abnormal   Collection Time: 05/27/15 10:55 PM  Result Value Ref Range   Sodium 144 135 - 145 mmol/L   Potassium 2.9 (L) 3.5 - 5.1 mmol/L   Chloride 107 101 - 111 mmol/L   BUN 15 6 - 20 mg/dL   Creatinine, Ser 0.60 0.44 - 1.00 mg/dL   Glucose, Bld 123 (H) 65 - 99 mg/dL   Calcium, Ion 1.20 1.13 - 1.30 mmol/L   TCO2 18 0 - 100 mmol/L   Hemoglobin 11.6 (L) 12.0 - 15.0 g/dL   HCT 34.0 (L) 36.0 - 46.0 %  Potassium     Status: None   Collection Time: 05/29/15  6:25 AM  Result Value Ref Range   Potassium 3.7 3.5 - 5.1 mmol/L    Comment: Performed at [facility]  Magnesium     Status: None   Collection Time:  05/29/15  6:25 AM  Result Value Ref Range   Magnesium 1.8 1.7 - 2.4 mg/dL    Comment: Performed at [facility]  TSH     Status: None   Collection Time: 05/29/15  6:25 AM  Result Value Ref Range   TSH 2.704 0.350 - 4.500 uIU/mL    Comment: Performed at [facility]  CBC     Status: Abnormal   Collection Time: 06/01/15  5:00 AM  Result Value Ref Range   WBC 6.2 4.0 - 10.5 K/uL   RBC 3.51 (L) 3.87 - 5.11 MIL/uL   Hemoglobin 10.7 (L) 12.0 - 15.0 g/dL   HCT 32.6 (L) 36.0 - 46.0 %   MCV 92.9 78.0 - 100.0 fL   MCH 30.5 26.0 - 34.0 pg   MCHC 32.8 30.0 - 36.0 g/dL   RDW 14.4 11.5 - 15.5 %   Platelets 120 (L) 150 - 400 K/uL    Comment: Performed at [facility] metabolic panel     Status: Abnormal   Collection Time: 06/01/15  7:05 AM  Result Value Ref Range   Sodium 140 135 - 145 mmol/L   Potassium 3.5 3.5 - 5.1 mmol/L   Chloride 109 101 - 111 mmol/L   CO2 24 22 - 32 mmol/L   Glucose, Bld 101 (H) 65 - 99 mg/dL   BUN 14 6 - 20 mg/dL   Creatinine, Ser 0.63 0.44 - 1.00 mg/dL   Calcium 9.2 8.9 - 10.3 mg/dL    GFR calc non Af Amer >60 >60 mL/min   GFR calc Af Amer >60 >60 mL/min    Comment: (NOTE) The eGFR has been calculated using the CKD EPI equation. This calculation has not been validated in all clinical situations. eGFR's persistently <60 mL/min signify possible Chronic Kidney Disease.    Anion gap 7 5 - 15    Comment: Performed at [facility]  CBC     Status: None   Collection Time: 06/06/15  2:58 PM  Result Value Ref Range   WBC 9.4 4.0 - 10.5 K/uL   RBC 4.50 3.87 - 5.11 MIL/uL   Hemoglobin 13.8 12.0 - 15.0 g/dL   HCT 42.4 36.0 - 46.0 %   MCV 94.2 78.0 - 100.0 fL   MCH 30.7 26.0 - 34.0 pg   MCHC 32.5 30.0 - 36.0 g/dL   RDW 13.9 11.5 - 15.5 %   Platelets 191 150 - 400 K/uL  Basic metabolic panel     Status: Abnormal   Collection Time: 06/06/15  2:58 PM  Result Value Ref Range   Sodium 139 135 - 145 mmol/L   Potassium 4.1 3.5 - 5.1 mmol/L   Chloride 107 101 - 111 mmol/L   CO2 24 22 - 32 mmol/L   Glucose, Bld 136 (H) 65 - 99 mg/dL   BUN 17 6 - 20 mg/dL   Creatinine, Ser 0.79 0.44 - 1.00 mg/dL   Calcium 9.7 8.9 - 10.3 mg/dL   GFR calc non Af Amer >60 >60 mL/min   GFR calc Af Amer >60 >60 mL/min    Comment: (NOTE) The eGFR has been calculated using the CKD EPI equation. This calculation has not been validated in all clinical situations. eGFR's persistently <60 mL/min signify possible Chronic Kidney Disease.    Anion gap 8 5 - 15  Urinalysis, Routine w reflex microscopic (not at [facility])     Status: Abnormal   Collection Time:  06/06/15  6:11 PM  Result Value Ref Range   Color, Urine YELLOW YELLOW   APPearance CLOUDY (A) CLEAR   Specific Gravity, Urine 1.015 1.005 - 1.030   pH 7.0 5.0 - 8.0   Glucose, UA NEGATIVE NEGATIVE mg/dL   Hgb urine dipstick NEGATIVE NEGATIVE   Bilirubin Urine NEGATIVE NEGATIVE   Ketones, ur NEGATIVE NEGATIVE mg/dL   Protein, ur NEGATIVE NEGATIVE mg/dL   Urobilinogen, UA 1.0 0.0 - 1.0 mg/dL   Nitrite NEGATIVE NEGATIVE    Leukocytes, UA SMALL (A) NEGATIVE  Urine microscopic-add on     Status: Abnormal   Collection Time: 06/06/15  6:11 PM  Result Value Ref Range   Squamous Epithelial / LPF FEW (A) RARE   WBC, UA 3-6 <3 WBC/hpf   Urine-Other MUCOUS PRESENT     Comment: AMORPHOUS URATES/PHOSPHATES      Constitutional:  BP 134/83 mmHg  Pulse 118  Ht 5' 2.6" (1.59 m)  Wt 143 lb 12.8 oz (65.227 kg)  BMI 25.80 kg/m2   Mental Status Examination;  Patient is casually dressed and fairly groomed. She appears tired and fatigued.  Her speech is slow, clear and coherent.  Her thought process slow and logical.  She described her mood sad and her affect is constricted. Her psychomotor activity is slightly decreased.  She denies any active or passive suicidal thoughts or homicidal thought.  There were no delusions, paranoia or any obsessive thoughts.  Her attention and concentration is fair.  Her fund of knowledge is average.  She has difficulty remembering things.  She has no tremors or shakes.  She is alert and oriented 3.  Her insight, judgment and impulse control is fair.   Review of Psycho-Social Stressors (1), Review or order clinical lab tests (1), Review and summation of old records (2), Established Problem, Worsening (2), Review of Last Therapy Session (1), Review of Medication Regimen & Side Effects (2) and Review of New Medication or Change in Dosage (2)  Assessment: Axis I: Maj. depressive disorder, recurrent severe, posttraumatic stress disorder, cognitive disorder NOS  Axis II: Deferred  Axis III:  Past Medical History  Diagnosis Date  . High cholesterol   . Depression   . Anxiety   . GERD (gastroesophageal reflux disease)   . Insomnia   . Arthritis   . Memory loss   . Emotional depression 02/04/2015   Plan:  I reviewed her discharge summary, current medication, recent blood work and notes from the emergency room.  We talked about polypharmacy and increased incidence of fall and dizziness due  to multiple medication.  She is no longer taking Lopressor.  I recommended to discontinue Halcion, Luvox at this time.  Recommended to increase Remeron 15 mg which can also help her sleep and appetite.  I will also start low-dose Celexa to help the depression .  Continue Klonopin 0.5 mg at bedtime and Seroquel 400 mg at bedtime.  One more time I discuss that she should continue follow-up with a neurologist for the management of memory impairment.  Her sleep studies were negative.  Discuss with the family about long-term care if patient continued to deteriorate and require 24 7 help.  I also encouraged to see Joaquim Lai for coping and social skills.  I will see her again in 4 weeks.  Plan discussed with the patient and her sister and niece in detail.  Recommended to call us back if there is any question concern or if patient feels worsening of the symptoms.  Time  spent 25 minutes.  More than 50% of the time spent in psychoeducation, counseling" of care. Discuss safety plan that anytime having active suicidal thoughts or homicidal thoughts then patient need to call 911 or go to the local emergency room.   Anayely Constantine T., MD 06/13/2015

## 2015-06-21 ENCOUNTER — Ambulatory Visit (HOSPITAL_COMMUNITY): Payer: Self-pay | Admitting: Psychiatry

## 2015-06-24 DIAGNOSIS — E440 Moderate protein-calorie malnutrition: Secondary | ICD-10-CM | POA: Diagnosis not present

## 2015-06-24 DIAGNOSIS — F411 Generalized anxiety disorder: Secondary | ICD-10-CM | POA: Diagnosis not present

## 2015-06-24 DIAGNOSIS — R031 Nonspecific low blood-pressure reading: Secondary | ICD-10-CM | POA: Diagnosis not present

## 2015-06-24 DIAGNOSIS — S5002XA Contusion of left elbow, initial encounter: Secondary | ICD-10-CM | POA: Diagnosis not present

## 2015-06-24 DIAGNOSIS — W19XXXA Unspecified fall, initial encounter: Secondary | ICD-10-CM | POA: Diagnosis not present

## 2015-06-28 ENCOUNTER — Other Ambulatory Visit (HOSPITAL_COMMUNITY): Payer: Self-pay | Admitting: Psychiatry

## 2015-06-29 ENCOUNTER — Other Ambulatory Visit: Payer: Self-pay | Admitting: Endocrinology

## 2015-07-03 ENCOUNTER — Encounter (HOSPITAL_COMMUNITY): Payer: Self-pay

## 2015-07-03 ENCOUNTER — Inpatient Hospital Stay (HOSPITAL_COMMUNITY)
Admission: EM | Admit: 2015-07-03 | Discharge: 2015-07-06 | DRG: 683 | Disposition: A | Discharge status: Skilled Nursing Facility | Payer: Medicare Other | Attending: Internal Medicine | Admitting: Internal Medicine

## 2015-07-03 ENCOUNTER — Emergency Department (HOSPITAL_COMMUNITY): Payer: Medicare Other

## 2015-07-03 DIAGNOSIS — E860 Dehydration: Secondary | ICD-10-CM | POA: Diagnosis not present

## 2015-07-03 DIAGNOSIS — N179 Acute kidney failure, unspecified: Secondary | ICD-10-CM | POA: Diagnosis not present

## 2015-07-03 DIAGNOSIS — R197 Diarrhea, unspecified: Secondary | ICD-10-CM | POA: Diagnosis present

## 2015-07-03 DIAGNOSIS — Z791 Long term (current) use of non-steroidal anti-inflammatories (NSAID): Secondary | ICD-10-CM

## 2015-07-03 DIAGNOSIS — Z833 Family history of diabetes mellitus: Secondary | ICD-10-CM

## 2015-07-03 DIAGNOSIS — N390 Urinary tract infection, site not specified: Secondary | ICD-10-CM | POA: Diagnosis not present

## 2015-07-03 DIAGNOSIS — E876 Hypokalemia: Secondary | ICD-10-CM | POA: Diagnosis not present

## 2015-07-03 DIAGNOSIS — F339 Major depressive disorder, recurrent, unspecified: Secondary | ICD-10-CM | POA: Diagnosis not present

## 2015-07-03 DIAGNOSIS — Z79899 Other long term (current) drug therapy: Secondary | ICD-10-CM | POA: Diagnosis not present

## 2015-07-03 DIAGNOSIS — I959 Hypotension, unspecified: Secondary | ICD-10-CM | POA: Diagnosis not present

## 2015-07-03 DIAGNOSIS — E878 Other disorders of electrolyte and fluid balance, not elsewhere classified: Secondary | ICD-10-CM | POA: Diagnosis present

## 2015-07-03 DIAGNOSIS — R262 Difficulty in walking, not elsewhere classified: Secondary | ICD-10-CM | POA: Diagnosis not present

## 2015-07-03 DIAGNOSIS — G4700 Insomnia, unspecified: Secondary | ICD-10-CM | POA: Diagnosis present

## 2015-07-03 DIAGNOSIS — E780 Pure hypercholesterolemia: Secondary | ICD-10-CM | POA: Diagnosis present

## 2015-07-03 DIAGNOSIS — R404 Transient alteration of awareness: Secondary | ICD-10-CM | POA: Diagnosis not present

## 2015-07-03 DIAGNOSIS — R42 Dizziness and giddiness: Secondary | ICD-10-CM | POA: Diagnosis not present

## 2015-07-03 DIAGNOSIS — K219 Gastro-esophageal reflux disease without esophagitis: Secondary | ICD-10-CM | POA: Diagnosis present

## 2015-07-03 DIAGNOSIS — M1990 Unspecified osteoarthritis, unspecified site: Secondary | ICD-10-CM | POA: Diagnosis present

## 2015-07-03 DIAGNOSIS — Z66 Do not resuscitate: Secondary | ICD-10-CM | POA: Diagnosis present

## 2015-07-03 DIAGNOSIS — R630 Anorexia: Secondary | ICD-10-CM | POA: Diagnosis present

## 2015-07-03 DIAGNOSIS — F333 Major depressive disorder, recurrent, severe with psychotic symptoms: Secondary | ICD-10-CM | POA: Diagnosis not present

## 2015-07-03 DIAGNOSIS — R358 Other polyuria: Secondary | ICD-10-CM | POA: Diagnosis not present

## 2015-07-03 DIAGNOSIS — E872 Acidosis: Secondary | ICD-10-CM | POA: Diagnosis present

## 2015-07-03 DIAGNOSIS — R531 Weakness: Secondary | ICD-10-CM | POA: Diagnosis not present

## 2015-07-03 DIAGNOSIS — F29 Unspecified psychosis not due to a substance or known physiological condition: Secondary | ICD-10-CM | POA: Diagnosis present

## 2015-07-03 DIAGNOSIS — F0390 Unspecified dementia without behavioral disturbance: Secondary | ICD-10-CM | POA: Diagnosis present

## 2015-07-03 DIAGNOSIS — F419 Anxiety disorder, unspecified: Secondary | ICD-10-CM | POA: Diagnosis present

## 2015-07-03 DIAGNOSIS — F332 Major depressive disorder, recurrent severe without psychotic features: Secondary | ICD-10-CM | POA: Diagnosis present

## 2015-07-03 DIAGNOSIS — M6281 Muscle weakness (generalized): Secondary | ICD-10-CM | POA: Diagnosis not present

## 2015-07-03 LAB — CBC WITH DIFFERENTIAL/PLATELET
BASOS PCT: 0 % (ref 0–1)
Basophils Absolute: 0 10*3/uL (ref 0.0–0.1)
Eosinophils Absolute: 0.1 10*3/uL (ref 0.0–0.7)
Eosinophils Relative: 3 % (ref 0–5)
HCT: 34.3 % — ABNORMAL LOW (ref 36.0–46.0)
HEMOGLOBIN: 11.4 g/dL — AB (ref 12.0–15.0)
LYMPHS PCT: 31 % (ref 12–46)
Lymphs Abs: 1.5 10*3/uL (ref 0.7–4.0)
MCH: 31.1 pg (ref 26.0–34.0)
MCHC: 33.2 g/dL (ref 30.0–36.0)
MCV: 93.7 fL (ref 78.0–100.0)
MONO ABS: 0.4 10*3/uL (ref 0.1–1.0)
MONOS PCT: 8 % (ref 3–12)
NEUTROS ABS: 2.9 10*3/uL (ref 1.7–7.7)
NEUTROS PCT: 58 % (ref 43–77)
Platelets: 125 10*3/uL — ABNORMAL LOW (ref 150–400)
RBC: 3.66 MIL/uL — ABNORMAL LOW (ref 3.87–5.11)
RDW: 12.5 % (ref 11.5–15.5)
WBC: 5 10*3/uL (ref 4.0–10.5)

## 2015-07-03 LAB — COMPREHENSIVE METABOLIC PANEL
ALT: 17 U/L (ref 14–54)
AST: 25 U/L (ref 15–41)
Albumin: 3.9 g/dL (ref 3.5–5.0)
Alkaline Phosphatase: 48 U/L (ref 38–126)
Anion gap: 9 (ref 5–15)
BUN: 19 mg/dL (ref 6–20)
CALCIUM: 9.4 mg/dL (ref 8.9–10.3)
CHLORIDE: 114 mmol/L — AB (ref 101–111)
CO2: 20 mmol/L — ABNORMAL LOW (ref 22–32)
CREATININE: 1.41 mg/dL — AB (ref 0.44–1.00)
GFR calc Af Amer: 45 mL/min — ABNORMAL LOW (ref >60)
GFR, EST NON AFRICAN AMERICAN: 38 mL/min — AB (ref >60)
Glucose, Bld: 92 mg/dL (ref 65–99)
Potassium: 3.3 mmol/L — ABNORMAL LOW (ref 3.5–5.1)
Sodium: 143 mmol/L (ref 135–145)
Total Bilirubin: 0.5 mg/dL (ref 0.3–1.2)
Total Protein: 6.5 g/dL (ref 6.5–8.1)

## 2015-07-03 LAB — I-STAT CG4 LACTIC ACID, ED: Lactic Acid, Venous: 1.54 mmol/L (ref 0.5–2.0)

## 2015-07-03 LAB — URINALYSIS, ROUTINE W REFLEX MICROSCOPIC
GLUCOSE, UA: NEGATIVE mg/dL
Ketones, ur: NEGATIVE mg/dL
NITRITE: NEGATIVE
Protein, ur: 30 mg/dL — AB
SPECIFIC GRAVITY, URINE: 1.027 (ref 1.005–1.030)
UROBILINOGEN UA: 0.2 mg/dL (ref 0.0–1.0)
pH: 6 (ref 5.0–8.0)

## 2015-07-03 LAB — URINE MICROSCOPIC-ADD ON

## 2015-07-03 LAB — TROPONIN I

## 2015-07-03 MED ORDER — SODIUM CHLORIDE 0.9 % IV BOLUS (SEPSIS)
1000.0000 mL | Freq: Once | INTRAVENOUS | Status: AC
  Administered 2015-07-03: 1000 mL via INTRAVENOUS

## 2015-07-03 MED ORDER — SODIUM CHLORIDE 0.9 % IV SOLN
INTRAVENOUS | Status: DC
  Administered 2015-07-03: 14:00:00 via INTRAVENOUS

## 2015-07-03 MED ORDER — KCL IN DEXTROSE-NACL 20-5-0.45 MEQ/L-%-% IV SOLN
INTRAVENOUS | Status: DC
  Administered 2015-07-03 – 2015-07-04 (×2): via INTRAVENOUS
  Filled 2015-07-03 (×3): qty 1000

## 2015-07-03 MED ORDER — DEXTROSE 5 % IV SOLN
1.0000 g | INTRAVENOUS | Status: DC
  Administered 2015-07-03 – 2015-07-06 (×4): 1 g via INTRAVENOUS
  Filled 2015-07-03 (×5): qty 10

## 2015-07-03 MED ORDER — ONDANSETRON HCL 4 MG/2ML IJ SOLN: 4.0000 mg | Freq: Four times a day (QID) | INTRAMUSCULAR | Status: DC | PRN

## 2015-07-03 MED ORDER — ONDANSETRON HCL 4 MG PO TABS: 4.0000 mg | ORAL_TABLET | Freq: Four times a day (QID) | ORAL | Status: DC | PRN

## 2015-07-03 MED ORDER — POTASSIUM CHLORIDE CRYS ER 20 MEQ PO TBCR
40.0000 meq | EXTENDED_RELEASE_TABLET | Freq: Once | ORAL | Status: DC
  Filled 2015-07-03: qty 2

## 2015-07-03 MED ORDER — CITALOPRAM HYDROBROMIDE 20 MG PO TABS
10.0000 mg | ORAL_TABLET | Freq: Every day | ORAL | Status: DC
  Administered 2015-07-03: 10 mg via ORAL
  Filled 2015-07-03: qty 0.5
  Filled 2015-07-03: qty 1
  Filled 2015-07-03: qty 0.5

## 2015-07-03 MED ORDER — ENOXAPARIN SODIUM 40 MG/0.4ML ~~LOC~~ SOLN
40.0000 mg | SUBCUTANEOUS | Status: DC
  Filled 2015-07-03: qty 0.4

## 2015-07-03 MED ORDER — PANTOPRAZOLE SODIUM 40 MG PO TBEC
40.0000 mg | DELAYED_RELEASE_TABLET | Freq: Every day | ORAL | Status: DC
  Administered 2015-07-03 – 2015-07-06 (×4): 40 mg via ORAL
  Filled 2015-07-03 (×4): qty 1

## 2015-07-03 MED ORDER — MIRTAZAPINE 15 MG PO TABS
15.0000 mg | ORAL_TABLET | Freq: Every day | ORAL | Status: DC
  Administered 2015-07-03 – 2015-07-05 (×3): 15 mg via ORAL
  Filled 2015-07-03 (×3): qty 1

## 2015-07-03 MED ORDER — CLONAZEPAM 0.5 MG PO TABS
0.5000 mg | ORAL_TABLET | Freq: Every day | ORAL | Status: DC
  Administered 2015-07-03 – 2015-07-05 (×3): 0.5 mg via ORAL
  Filled 2015-07-03 (×3): qty 1

## 2015-07-03 MED ORDER — MEMANTINE HCL 10 MG PO TABS
10.0000 mg | ORAL_TABLET | Freq: Two times a day (BID) | ORAL | Status: DC
  Administered 2015-07-03 – 2015-07-06 (×5): 10 mg via ORAL
  Filled 2015-07-03 (×5): qty 1

## 2015-07-03 MED ORDER — ACETAMINOPHEN 500 MG PO TABS
1000.0000 mg | ORAL_TABLET | Freq: Four times a day (QID) | ORAL | Status: DC | PRN
  Administered 2015-07-03 – 2015-07-05 (×6): 1000 mg via ORAL
  Filled 2015-07-03 (×6): qty 2

## 2015-07-03 MED ORDER — MEGESTROL ACETATE 400 MG/10ML PO SUSP
400.0000 mg | Freq: Every day | ORAL | Status: DC
  Administered 2015-07-04 – 2015-07-06 (×3): 400 mg via ORAL
  Filled 2015-07-03 (×4): qty 10

## 2015-07-03 MED ORDER — TAMSULOSIN HCL 0.4 MG PO CAPS
0.4000 mg | ORAL_CAPSULE | Freq: Every day | ORAL | Status: DC
  Administered 2015-07-03 – 2015-07-06 (×4): 0.4 mg via ORAL
  Filled 2015-07-03 (×4): qty 1

## 2015-07-03 MED ORDER — QUETIAPINE FUMARATE 200 MG PO TABS
400.0000 mg | ORAL_TABLET | Freq: Every day | ORAL | Status: DC
  Administered 2015-07-03 – 2015-07-05 (×3): 400 mg via ORAL
  Filled 2015-07-03 (×3): qty 2

## 2015-07-03 NOTE — ED Provider Notes (Signed)
CSN: [id number]     Arrival date & time 07/03/15  1234 History   First MD Initiated Contact with Patient 07/03/15 1255     Chief Complaint  Patient presents with  . Hypotension     (Consider location/radiation/quality/duration/timing/severity/associated sxs/prior Treatment) HPI Comments: Patient complains of weakness with several falls over the last few days. States that she has had anorexia which is causing her to not eat and drink as normal. When she is falling she has not had loss of consciousness. Denies any associated abdominal chest pain. Does have some underlying dementia . No recent bloody stools. No fever or chills. EMS was called and patient's blood pressure was 80/56. She was given 500 mL of saline and blood pressure improved to 112/60. She is not having any cough or shortness of breath. Denies any urinary symptoms  The history is provided by the patient.    Past Medical History  Diagnosis Date  . High cholesterol   . Depression   . Anxiety   . GERD (gastroesophageal reflux disease)   . Insomnia   . Arthritis   . Memory loss   . Emotional depression 02/04/2015   Past Surgical History  Procedure Laterality Date  . Cosmetic surgery    . Cesarean section    . Laproscopy     Family History  Problem Relation Age of Onset  . Sleep apnea Father   . Alcohol abuse Father   . Diabetes Mother   . Diabetes Sister   . Diabetes Maternal Uncle   . Diabetes Cousin    History  Substance Use Topics  . Smoking status: Never Smoker   . Smokeless tobacco: Never Used  . Alcohol Use: No     Comment: zero   OB History    No data available     Review of Systems  All other systems reviewed and are negative.     Allergies  Review of patient's allergies indicates no known allergies.  Home Medications   Prior to Admission medications   Medication Sig Start Date End Date Taking? Authorizing Provider  acetaminophen (TYLENOL) 500 MG tablet Take 1,000 mg by mouth every 6  (six) hours as needed for mild pain or headache.    Historical Provider, MD  citalopram (CELEXA) 10 MG tablet Take 1 tablet (10 mg total) by mouth daily. 06/13/15 06/12/16  [name], MD  clonazePAM (KLONOPIN) 0.5 MG tablet Take 1 tablet (0.5 mg total) by mouth at bedtime. 06/02/15   Shuvon B Rankin, NP  cyanocobalamin (,VITAMIN B-12,) 1000 MCG/ML injection Inject 1 mL (1,000 mcg total) into the skin once a week. For bone health Patient not taking: Reported on 06/06/2015 12/20/14   Encarnacion Slates, NP  ibuprofen (ADVIL,MOTRIN) 200 MG tablet Take 800 mg by mouth every 6 (six) hours as needed.    Historical Provider, MD  loratadine (CLARITIN) 10 MG tablet Take 1 tablet (10 mg total) by mouth daily. 06/02/15   Shuvon B Rankin, NP  megestrol (MEGACE) 400 MG/10ML suspension Take 10 mLs (400 mg total) by mouth daily. 06/02/15   Shuvon B Rankin, NP  memantine (NAMENDA) 10 MG tablet Take 1 tablet (10 mg total) by mouth 2 (two) times daily. 06/02/15   Shuvon B Rankin, NP  mirtazapine (REMERON) 15 MG tablet Take 1 tablet (15 mg total) by mouth at bedtime. 06/13/15   [name], MD  nystatin (MYCOSTATIN/NYSTOP) 100000 UNIT/GM POWD Apply topically 2 times daily 06/02/15   Consuello Closs, NP  omeprazole (PRILOSEC) 20 MG capsule Take 1 capsule (20 mg total) by mouth daily. For acid reflux 06/02/15   Shuvon B Rankin, NP  pravastatin (PRAVACHOL) 20 MG tablet Take 1 tablet (20 mg total) by mouth at bedtime. For high cholesterol 06/02/15   Shuvon B Rankin, NP  QUEtiapine (SEROQUEL) 400 MG tablet Take 1 tablet (400 mg total) by mouth at bedtime. 06/13/15   [name], MD  tamsulosin (FLOMAX) 0.4 MG CAPS capsule Take 1 capsule (0.4 mg total) by mouth daily after supper. For frequent urination/urgency 06/02/15   Shuvon B Rankin, NP   BP 134/74 mmHg  Pulse 85  Temp(Src) 98.5 F (36.9 C) (Oral)  Resp 20  SpO2 100% Physical Exam  Constitutional: She is oriented to person, place, and time. She appears cachectic.  Non-toxic  appearance. She has a sickly appearance. She appears ill. No distress.  HENT:  Head: Normocephalic and atraumatic.  Eyes: Conjunctivae, EOM and lids are normal. Pupils are equal, round, and reactive to light.  Neck: Normal range of motion. Neck supple. No tracheal deviation present. No thyroid mass present.  Cardiovascular: Normal rate, regular rhythm and normal heart sounds.  Exam reveals no gallop.   No murmur heard. Pulmonary/Chest: Effort normal and breath sounds normal. No stridor. No respiratory distress. She has no decreased breath sounds. She has no wheezes. She has no rhonchi. She has no rales.  Abdominal: Soft. Normal appearance and bowel sounds are normal. She exhibits no distension. There is no tenderness. There is no rebound and no CVA tenderness.  Musculoskeletal: Normal range of motion. She exhibits no edema or tenderness.  Neurological: She is alert and oriented to person, place, and time. She has normal strength. No cranial nerve deficit or sensory deficit. GCS eye subscore is 4. GCS verbal subscore is 5. GCS motor subscore is 6.  Skin: Skin is warm and dry. No abrasion and no rash noted.  Psychiatric: She has a normal mood and affect. Her speech is normal and behavior is normal.  Nursing note and vitals reviewed.   ED Course  Procedures (including critical care time) Labs Review Labs Reviewed  CBC WITH DIFFERENTIAL/PLATELET  COMPREHENSIVE METABOLIC PANEL  TROPONIN I  URINALYSIS, ROUTINE W REFLEX MICROSCOPIC (NOT AT [facility])  I-STAT CG4 LACTIC ACID, ED    Imaging Review No results found.   EKG Interpretation   Date/Time:  Sunday July 03 2015 12:50:10 EDT Ventricular Rate:  83 PR Interval:  138 QRS Duration: 87 QT Interval:  381 QTC Calculation: 448 R Axis:   69 Text Interpretation:  Sinus rhythm Abnormal R-wave progression, early  transition Nonspecific T abnormalities, anterior leads Baseline wander in  lead(s) V1 V6 Confirmed by Zenia Resides  MD, Dmitri Pettigrew ([id number]) on  07/03/2015 2:33:45  PM      MDM   Final diagnoses:  None    Patient given IV fluids here and blood pressure stabilized. She has evidence of dehydration on her blood work and be admitted to the hospitalist    Lacretia Leigh, MD 07/03/15 1440

## 2015-07-03 NOTE — ED Notes (Signed)
Per EMS, pt from home.  Pt with hypotension.  Pt has had several falls recently.  Pt with underlying dementia.  Pt not eating/drinking as normal.  Pt getting weak.  History per family.  Pt cleared spinal assessment.  Pt ambulatory.  Initial bp 80/56 standing.  500cc NS given via 20g RFA.  Ending vitals:  112/60, hr 80, cbg 95, 98% ra.  Pt is alert and oriented to baseline.

## 2015-07-03 NOTE — Progress Notes (Signed)
ANTIBIOTIC CONSULT NOTE - INITIAL  Pharmacy Consult for ceftriaxone Indication: UTI  No Known Allergies  Patient Measurements:   Adjusted Body Weight:   Vital Signs: Temp: 97.9 F (36.6 C) (07/17 1508) Temp Source: Oral (07/17 1508) BP: 144/67 mmHg (07/17 1508) Pulse Rate: 87 (07/17 1508) Intake/Output from previous day:   Intake/Output from this shift:    Labs:  Recent Labs  07/03/15 1315  WBC 5.0  HGB 11.4*  PLT 125*  CREATININE 1.41*   CrCl cannot be calculated (Unknown ideal weight.). No results for input(s): VANCOTROUGH, VANCOPEAK, VANCORANDOM, GENTTROUGH, GENTPEAK, GENTRANDOM, TOBRATROUGH, TOBRAPEAK, TOBRARND, AMIKACINPEAK, AMIKACINTROU, AMIKACIN in the last 72 hours.   Microbiology: No results found for this or any previous visit (from the past 720 hour(s)).  Medical History: Past Medical History  Diagnosis Date  . High cholesterol   . Depression   . Anxiety   . GERD (gastroesophageal reflux disease)   . Insomnia   . Arthritis   . Memory loss   . Emotional depression 02/04/2015   Assessment: 12 YOF presents with falls, hypotensive in ED.  Recently started citalopram.  Concern for UTI.  Pharmacy asked to dose ceftriaxone  7/17 >> ceftriaxone >>  Renal: AKI - Scr increased WBC WNL  Goal of Therapy:  Dose for indication and patient specific parameters  Plan:   Continue ceftriaxone 1gm IV q24h (no renal adjustment needed)  No further adjustment required at this time  Doreene Eland, PharmD, BCPS.   Pager: [phone]  07/03/2015,3:54 PM

## 2015-07-03 NOTE — H&P (Signed)
PCP:   [name], MD   Chief Complaint:  Fall  HPI: 64 year old female who  has a past medical history of High cholesterol; Depression; Anxiety; GERD (gastroesophageal reflux disease); Insomnia; Arthritis; Memory loss; and Emotional depression (02/04/2015). today was brought to the hospital by her sister and caregiver due to several falls over the past few days. Patient has not been eating and drinking well for past 3 weeks. Patient says that she feels hopeless and is concerned about her future. She denies suicidal ideation. Patient has a history of depression and dementia and is taken care of at home by her caregivers. She also endorses some diarrhea but no vomiting or abdominal pain. In the ED patient was found to have hypotension blood pressure was 80/56. After 500 of saline bolus blood pressure improved to 112/60. Patient was recently seen by her psychiatrist 2 weeks ago and was started on Celexa. She already is taking Seroquel and mirtazapine. Patient also found to have abnormal UA the ED and given one dose of Rocephin.  Allergies:  No Known Allergies    Past Medical History  Diagnosis Date  . High cholesterol   . Depression   . Anxiety   . GERD (gastroesophageal reflux disease)   . Insomnia   . Arthritis   . Memory loss   . Emotional depression 02/04/2015    Past Surgical History  Procedure Laterality Date  . Cosmetic surgery    . Cesarean section    . Laproscopy      Prior to Admission medications   Medication Sig Start Date End Date Taking? Authorizing Provider  acetaminophen (TYLENOL) 500 MG tablet Take 1,000 mg by mouth every 6 (six) hours as needed for mild pain or headache.   Yes Historical Provider, MD  citalopram (CELEXA) 10 MG tablet Take 1 tablet (10 mg total) by mouth daily. 06/13/15 06/12/16 Yes [name], MD  clonazePAM (KLONOPIN) 0.5 MG tablet Take 1 tablet (0.5 mg total) by mouth at bedtime. 06/02/15  Yes Shuvon B Rankin, NP  ibuprofen (ADVIL,MOTRIN)  200 MG tablet Take 800 mg by mouth every 6 (six) hours as needed.   Yes Historical Provider, MD  loperamide (IMODIUM A-D) 2 MG tablet Take 2 mg by mouth 4 (four) times daily as needed for diarrhea or loose stools.   Yes Historical Provider, MD  megestrol (MEGACE) 400 MG/10ML suspension Take 10 mLs (400 mg total) by mouth daily. 06/02/15  Yes Shuvon B Rankin, NP  memantine (NAMENDA) 10 MG tablet Take 1 tablet (10 mg total) by mouth 2 (two) times daily. 06/02/15  Yes Shuvon B Rankin, NP  mirtazapine (REMERON) 15 MG tablet Take 1 tablet (15 mg total) by mouth at bedtime. 06/13/15  Yes [name], MD  nystatin (MYCOSTATIN/NYSTOP) 100000 UNIT/GM POWD Apply topically 2 times daily 06/02/15  Yes Shuvon B Rankin, NP  omeprazole (PRILOSEC) 20 MG capsule Take 1 capsule (20 mg total) by mouth daily. For acid reflux 06/02/15  Yes Shuvon B Rankin, NP  pravastatin (PRAVACHOL) 20 MG tablet Take 1 tablet (20 mg total) by mouth at bedtime. For high cholesterol 06/02/15  Yes Shuvon B Rankin, NP  QUEtiapine (SEROQUEL) 400 MG tablet Take 1 tablet (400 mg total) by mouth at bedtime. 06/13/15  Yes [name], MD  tamsulosin (FLOMAX) 0.4 MG CAPS capsule Take 1 capsule (0.4 mg total) by mouth daily after supper. For frequent urination/urgency 06/02/15  Yes Shuvon B Rankin, NP  cyanocobalamin (,VITAMIN B-12,) 1000 MCG/ML injection Inject  1 mL (1,000 mcg total) into the skin once a week. For bone health Patient not taking: Reported on 06/06/2015 12/20/14   Encarnacion Slates, NP  loratadine (CLARITIN) 10 MG tablet Take 1 tablet (10 mg total) by mouth daily. Patient not taking: Reported on 07/03/2015 06/02/15   Mercy Moore Rankin, NP    Social History:  reports that she has never smoked. She has never used smokeless tobacco. She reports that she does not drink alcohol or use illicit drugs.  Family History  Problem Relation Age of Onset  . Sleep apnea Father   . Alcohol abuse Father   . Diabetes Mother   . Diabetes Sister   .  Diabetes Maternal Uncle   . Diabetes Cousin      All the positives are listed in BOLD  Review of Systems:  HEENT: Headache, blurred vision, runny nose, sore throat Neck: Hypothyroidism, hyperthyroidism,,lymphadenopathy Chest : Shortness of breath, history of COPD, Asthma Heart : Chest pain, history of coronary arterey disease GI:  Nausea, vomiting, diarrhea, constipation, GERD GU: Dysuria, urgency, frequency of urination, hematuria Neuro: Stroke, seizures, syncope Psych: Depression, anxiety, hallucinations   Physical Exam: Blood pressure 144/67, pulse 87, temperature 97.9 F (36.6 C), temperature source Oral, resp. rate 10, SpO2 97 %. Constitutional:   Patient is a well-developed and well-nourished female in no acute distress and cooperative with exam. Head: Normocephalic and atraumatic Mouth: Mucus membranes moist Eyes: PERRL, EOMI, conjunctivae normal Neck: Supple, No Thyromegaly Cardiovascular: RRR, S1 normal, S2 normal Pulmonary/Chest: CTAB, no wheezes, rales, or rhonchi Abdominal: Soft. Non-tender, non-distended, bowel sounds are normal, no masses, organomegaly, or guarding present.  Neurological: A&O x3, Strength is normal and symmetric bilaterally, cranial nerve II-XII are grossly intact, no focal motor deficit, sensory intact to light touch bilaterally.  Extremities : No Cyanosis, Clubbing or Edema  Labs on Admission:  Basic Metabolic Panel:  Recent Labs Lab 07/03/15 1315  NA 143  K 3.3*  CL 114*  CO2 20*  GLUCOSE 92  BUN 19  CREATININE 1.41*  CALCIUM 9.4   Liver Function Tests:  Recent Labs Lab 07/03/15 1315  AST 25  ALT 17  ALKPHOS 48  BILITOT 0.5  PROT 6.5  ALBUMIN 3.9   No results for input(s): LIPASE, AMYLASE in the last 168 hours. No results for input(s): AMMONIA in the last 168 hours. CBC:  Recent Labs Lab 07/03/15 1315  WBC 5.0  NEUTROABS 2.9  HGB 11.4*  HCT 34.3*  MCV 93.7  PLT 125*   Cardiac Enzymes:  Recent Labs Lab  07/03/15 1315  TROPONINI <0.03     Radiological Exams on Admission: Ct Head Wo Contrast  07/03/2015   CLINICAL DATA:  Dizziness, hypotension  EXAM: CT HEAD WITHOUT CONTRAST  TECHNIQUE: Contiguous axial images were obtained from the base of the skull through the vertex without intravenous contrast.  COMPARISON:  02/06/2014  FINDINGS: No skull fracture is noted. Paranasal sinuses and mastoid air cells are unremarkable. No intracranial hemorrhage, mass effect or midline shift. No acute cortical infarction. No mass lesion is noted on this unenhanced scan. Stable cerebral atrophy. Stable periventricular chronic white matter disease.  IMPRESSION: No acute intracranial abnormality. Stable atrophy and chronic white matter disease.   Electronically Signed   By: Lahoma Crocker M.D.   On: 07/03/2015 13:58    EKG: Independently reviewed. Sinus rhythm, nonspecific T-wave abnormalities   Assessment/Plan Active Problems:   Dehydration   UTI (lower urinary tract infection)   Hypokalemia   AKI (acute  kidney injury)  Acute kidney injury Due to dehydration and poor by mouth intake. Patient started on IV normal saline. Follow BMP in a.m.  UTI Patient with abnormal UA, started Rocephin in the ED. Follow urine culture results.  Hypokalemia Replace potassium, check BMP in a.m.  Hyperchloremic metabolic acidosis Patient has mild hyperchloremic metabolic acidosis, chloride 114, CO2 20. Start D5 half normal saline with 20 of KCl at 100 mL per hour.  Depression Patient has severe depression, feels hopeless not eating. Has been started on Celexa, mirtazapine with no significant effect. Will consult psych for further recommendations.  Anorexia Continue Megace  DVT prophylaxis SCDs  Code status: DO NOT RESUSCITATE  Family discussion: Admission, patients condition and plan of care including tests being ordered have been discussed with the patient and her sister at bedside* who indicate understanding and  agree with the plan and Code Status.   Time Spent on Admission: 60 minutes  [facility] Pager: [phone] 07/03/2015, 3:16 PM  If 7PM-7AM, please contact night-coverage  [email or web address]  Password [email or web address]

## 2015-07-03 NOTE — ED Notes (Signed)
MD at bedside. 

## 2015-07-04 ENCOUNTER — Other Ambulatory Visit (HOSPITAL_COMMUNITY): Payer: Self-pay | Admitting: Psychiatry

## 2015-07-04 LAB — CBC
HCT: 30.2 % — ABNORMAL LOW (ref 36.0–46.0)
Hemoglobin: 10.1 g/dL — ABNORMAL LOW (ref 12.0–15.0)
MCH: 31.1 pg (ref 26.0–34.0)
MCHC: 33.4 g/dL (ref 30.0–36.0)
MCV: 92.9 fL (ref 78.0–100.0)
Platelets: 108 10*3/uL — ABNORMAL LOW (ref 150–400)
RBC: 3.25 MIL/uL — ABNORMAL LOW (ref 3.87–5.11)
RDW: 12.6 % (ref 11.5–15.5)
WBC: 3.8 10*3/uL — ABNORMAL LOW (ref 4.0–10.5)

## 2015-07-04 LAB — COMPREHENSIVE METABOLIC PANEL
ALBUMIN: 3.4 g/dL — AB (ref 3.5–5.0)
ALK PHOS: 42 U/L (ref 38–126)
ALT: 16 U/L (ref 14–54)
AST: 21 U/L (ref 15–41)
Anion gap: 6 (ref 5–15)
BUN: 10 mg/dL (ref 6–20)
CHLORIDE: 116 mmol/L — AB (ref 101–111)
CO2: 20 mmol/L — AB (ref 22–32)
Calcium: 8.5 mg/dL — ABNORMAL LOW (ref 8.9–10.3)
Creatinine, Ser: 0.7 mg/dL (ref 0.44–1.00)
GFR calc Af Amer: >60 mL/min (ref >60)
GFR calc non Af Amer: >60 mL/min (ref >60)
Glucose, Bld: 109 mg/dL — ABNORMAL HIGH (ref 65–99)
Potassium: 3 mmol/L — ABNORMAL LOW (ref 3.5–5.1)
Sodium: 142 mmol/L (ref 135–145)
Total Bilirubin: 0.7 mg/dL (ref 0.3–1.2)
Total Protein: 5.5 g/dL — ABNORMAL LOW (ref 6.5–8.1)

## 2015-07-04 LAB — TSH: TSH: 1.523 u[IU]/mL (ref 0.350–4.500)

## 2015-07-04 MED ORDER — POTASSIUM CHLORIDE 10 MEQ/100ML IV SOLN
10.0000 meq | INTRAVENOUS | Status: AC
  Administered 2015-07-04 (×3): 10 meq via INTRAVENOUS
  Filled 2015-07-04 (×3): qty 100

## 2015-07-04 MED ORDER — CITALOPRAM HYDROBROMIDE 20 MG PO TABS
20.0000 mg | ORAL_TABLET | Freq: Every day | ORAL | Status: DC
  Administered 2015-07-04 – 2015-07-05 (×2): 20 mg via ORAL
  Filled 2015-07-04 (×2): qty 1

## 2015-07-04 MED ORDER — FAMOTIDINE 20 MG PO TABS
20.0000 mg | ORAL_TABLET | Freq: Two times a day (BID) | ORAL | Status: DC | PRN
  Filled 2015-07-04: qty 1

## 2015-07-04 NOTE — Progress Notes (Signed)
[facility] PROGRESS NOTE  Kendra Simmons NLG:[id number] DOB: June 23, 1951 DOA: 07/03/2015 PCP: [name], MD  Assessment/Plan:  Acute kidney injury resolved, today creatinine 0.70 Due to dehydration and poor by mouth intake.   UTI Patient with abnormal UA, started Rocephin in the ED. Follow urine culture results.  Hypokalemia Replace potassium, check BMP in a.m.  Hyperchloremic metabolic acidosis Patient has mild hyperchloremic metabolic acidosis Continue D5 half normal finger 20 of KCl  Depression Patient has severe depression, feels hopeless not eating. Has been started on Celexa, mirtazapine with no significant effect. Psychiatry consulted and patient started on Seroquel  Anorexia Continue Megace  DVT prophylaxis Lovenox   Code Status: DO NOT RESUSCITATE Family Communication: *No family at bedside Disposition Plan: Home when medically stable   Consultants:  Psychiatry  Procedures:  None  Antibiotics:  Rocephin  HPI/Subjective: 64 year old female who  has a past medical history of High cholesterol; Depression; Anxiety; GERD (gastroesophageal reflux disease); Insomnia; Arthritis; Memory loss; and Emotional depression (02/04/2015). today was brought to the hospital by her sister and caregiver due to several falls over the past few days. Patient has not been eating and drinking well for past 3 weeks. Patient says that she feels hopeless and is concerned about her future. She denies suicidal ideation. Patient has a history of depression and dementia and is taken care of at home by her caregivers. She also endorses some diarrhea but no vomiting or abdominal pain. In the ED patient was found to have hypotension blood pressure was 80/56. After 500 of saline bolus blood pressure improved to 112/60.  This morning patient feels ok, has been eating better, denies any pain or shortness of breath.  Objective: Filed Vitals:   07/04/15 0658  BP: 100/60  Pulse: 87   Temp: 98.8 F (37.1 C)  Resp: 18    Intake/Output Summary (Last 24 hours) at 07/04/15 1645 Last data filed at 07/04/15 1230  Gross per 24 hour  Intake 1923.33 ml  Output   1001 ml  Net 922.33 ml   Filed Weights   07/03/15 1555  Weight: 64.547 kg (142 lb 4.8 oz)    Exam:   General:  Appears in no acute distress  Cardiovascular: S1-S2 regular  Respiratory: Clear to auscultation bilaterally  Abdomen: Soft, nontender, no organomegaly  Musculoskeletal: No edema of the lower extremities   Data Reviewed: Basic Metabolic Panel:  Recent Labs Lab 07/03/15 1315 07/04/15 0428  NA 143 142  K 3.3* 3.0*  CL 114* 116*  CO2 20* 20*  GLUCOSE 92 109*  BUN 19 10  CREATININE 1.41* 0.70  CALCIUM 9.4 8.5*   Liver Function Tests:  Recent Labs Lab 07/03/15 1315 07/04/15 0428  AST 25 21  ALT 17 16  ALKPHOS 48 42  BILITOT 0.5 0.7  PROT 6.5 5.5*  ALBUMIN 3.9 3.4*   No results for input(s): LIPASE, AMYLASE in the last 168 hours. No results for input(s): AMMONIA in the last 168 hours. CBC:  Recent Labs Lab 07/03/15 1315 07/04/15 0428  WBC 5.0 3.8*  NEUTROABS 2.9  --   HGB 11.4* 10.1*  HCT 34.3* 30.2*  MCV 93.7 92.9  PLT 125* 108*   Cardiac Enzymes:  Recent Labs Lab 07/03/15 1315  TROPONINI <0.03   Studies: Ct Head Wo Contrast  07/03/2015   CLINICAL DATA:  Dizziness, hypotension  EXAM: CT HEAD WITHOUT CONTRAST  TECHNIQUE: Contiguous axial images were obtained from the base of the skull through the vertex without intravenous contrast.  COMPARISON:  02/06/2014  FINDINGS: No skull fracture is noted. Paranasal sinuses and mastoid air cells are unremarkable. No intracranial hemorrhage, mass effect or midline shift. No acute cortical infarction. No mass lesion is noted on this unenhanced scan. Stable cerebral atrophy. Stable periventricular chronic white matter disease.  IMPRESSION: No acute intracranial abnormality. Stable atrophy and chronic white matter disease.    Electronically Signed   By: Lahoma Crocker M.D.   On: 07/03/2015 13:58    Scheduled Meds: . cefTRIAXone (ROCEPHIN)  IV  1 g Intravenous Q24H  . citalopram  20 mg Oral Daily  . clonazePAM  0.5 mg Oral QHS  . enoxaparin (LOVENOX) injection  40 mg Subcutaneous Q24H  . megestrol  400 mg Oral Daily  . memantine  10 mg Oral BID  . mirtazapine  15 mg Oral QHS  . pantoprazole  40 mg Oral Daily  . potassium chloride  40 mEq Oral Once  . QUEtiapine  400 mg Oral QHS  . tamsulosin  0.4 mg Oral Daily   Continuous Infusions: . dextrose 5 % and 0.45 % NaCl with KCl 20 mEq/L 10 mL/hr (07/04/15 1002)    Principal Problem:   MDD (major depressive disorder), recurrent, severe, with psychosis Active Problems:   Dehydration   UTI (lower urinary tract infection)   Hypokalemia   AKI (acute kidney injury)    Time spent: 25 min    [facility] [facility] Pager [phone]. If 7PM-7AM, please contact night-coverage at [email or web address], password [facility] 07/04/2015, 4:45 PM  LOS: 1 day

## 2015-07-04 NOTE — Discharge Instructions (Signed)
[facility] Stay Proper nutrition can help your body recover from illness and injury.   Foods and beverages high in protein, vitamins, and minerals help rebuild muscle loss, promote healing, & reduce fall risk.   In addition to eating healthy foods, a nutrition shake is an easy, delicious way to get the nutrition you need during and after your hospital stay  It is recommended that you continue to drink 2 bottles per day of:       Carnation Instant Breakfast   for at least 1 month (30 days) after your hospital stay   Tips for adding a nutrition shake into your routine: As allowed, drink one with vitamins or medications instead of water or juice Enjoy one as a tasty mid-morning or afternoon snack Drink cold or make a milkshake out of it Drink one instead of milk with cereal or snacks Use as a coffee creamer   Available at the following grocery stores and pharmacies:           * [place] [phone]            For COUPONS visit: www.ensure.com/join or [email or web address]   Suggested Substitutions Ensure Plus = Boost Plus = Carnation Breakfast Essentials = Boost Compact Ensure Active Clear = Boost Breeze Glucerna Shake = Boost Glucose Control = Carnation Breakfast Essentials SUGAR FREE

## 2015-07-04 NOTE — Clinical Social Work Psych Assess (Signed)
Clinical Social Work Nature conservation officer  Clinical Social Worker:  Boone Master, [place] Date/Time:  07/04/2015, 2:39 PM Referred By:  Physician Date Referred:  07/04/15 Reason for Referral:  Behavioral Health Issues   Presenting Symptoms/Problems  Presenting Symptoms/Problems(in person's/family's own words):  Psych consulted due to depression.   Abuse/Neglect/Trauma History  Abuse/Neglect/Trauma History:  Denies History Abuse/Neglect/Trauma History Comments (indicate dates):  N/A   Psychiatric History  Psychiatric History:  Inpatient/Hospitalization, Outpatient Treatment Psychiatric Medication:  Klonopin, Celexa, Remeron, Seroquel   Current Mental Health Hospitalizations/Previous Mental Health History:  Patient has been diagnosed with depression in the past and is currently receiving medication management on outpatient basis.   Current Provider:  Dr. Adele Schilder Place and Date:  [facility]  Current Medications:   Scheduled Meds: . cefTRIAXone (ROCEPHIN)  IV  1 g Intravenous Q24H  . citalopram  10 mg Oral Daily  . clonazePAM  0.5 mg Oral QHS  . enoxaparin (LOVENOX) injection  40 mg Subcutaneous Q24H  . megestrol  400 mg Oral Daily  . memantine  10 mg Oral BID  . mirtazapine  15 mg Oral QHS  . pantoprazole  40 mg Oral Daily  . potassium chloride  10 mEq Intravenous Q1 Hr x 3  . potassium chloride  40 mEq Oral Once  . QUEtiapine  400 mg Oral QHS  . tamsulosin  0.4 mg Oral Daily   Continuous Infusions: . dextrose 5 % and 0.45 % NaCl with KCl 20 mEq/L 10 mL/hr (07/04/15 1002)   PRN Meds:.acetaminophen, ondansetron **OR** ondansetron (ZOFRAN) IV     Previous Inpatient Admission/Date/Reason:  Patient was hospitalized at [facility] about 1 month ago.      Emotional Health/Current Symptoms  Suicide/Self Harm: None Reported Suicide Attempt in Past (date/description):  None reported  Other Harmful Behavior (ex. homicidal ideation) (describe):  None  reported   Psychotic/Dissociative Symptoms  Psychotic/Dissociative Symptoms: None Reported Other Psychotic/Dissociative Symptoms:  N/A   Attention/Behavioral Symptoms  Attention/Behavioral Symptoms: Within Normal Limits Other Attention/Behavioral Symptoms:  Patient engaged during assessment.   Cognitive Impairment  Cognitive Impairment:  Within Normal Limits Other Cognitive Impairment:  Patient alert and oriented and performed well during mini mental status exam.   Mood and Adjustment  Mood and Adjustment:  Mood Congruent   Stress, Anxiety, Trauma, Any Recent Loss/Stressor  Stress, Anxiety, Trauma, Any Recent Loss/Stressor: Grief/Loss (recent or history) Anxiety (frequency):  N/A  Phobia (specify):  N/A  Compulsive Behavior (specify):  N/A  Obsessive Behavior (specify):  N/A  Other Stress, Anxiety, Trauma, Any Recent Loss/Stressor:  Patient's mother passed away in 03-13-2015.   Substance Abuse/Use  Substance Abuse/Use: None SBIRT Completed (please refer for detailed history): No Self-reported Substance Use (last use and frequency):  None reported  Urinary Drug Screen Completed: No Alcohol Level:  N/A   Environment/Housing/Living Arrangement  Environmental/Housing/Living Arrangement: Stable Housing Who is in the Home:  Alone  Emergency Contact:  Niece and sisters   Financial  Financial: Medicare   Patient's Strengths and Goals  Patient's Strengths and Goals (patient's own words):  Patient reports supportive family.   Clinical Social Worker's Interpretive Summary  Clinical Social Workers Interpretive Summary:    CSW received referral in order to complete psychosocial assessment. CSW reviewed chart and met with patient at bedside with psych MD.  Patient lives alone and has support from family. Patient reports that she is compliant with medications and follows up on outpatient basis with Dr. Adele Schilder. Patient reports that depression is being  managed  by medication but that she had to be hospitalized at [facility] last month due to insomnia. Patient denies any SI or HI and denies and hallucinations. Patient reports that she continues to have trouble sleeping but feels that her depression has been affected by her mother passing away.  Psych MD to make medication recommendations. CSW will continue to follow.   Disposition  Disposition: Outpatient Referral Made/Needed    Sindy Messing, LCSW [phone]

## 2015-07-04 NOTE — Progress Notes (Signed)
Initial Nutrition Assessment  DOCUMENTATION CODES:   Severe malnutrition in context of acute illness/injury  INTERVENTION:   Encourage PO intake Patient declines supplements at this time  NUTRITION DIAGNOSIS:   Malnutrition related to acute illness as evidenced by severe depletion of muscle mass, moderate depletion of body fat, energy intake < or equal to 50% for > or equal to 5 days.  GOAL:   Patient will meet greater than or equal to 90% of their needs  MONITOR:   PO intake, Labs, Weight trends, Skin, I & O's  REASON FOR ASSESSMENT:   Malnutrition Screening Tool    ASSESSMENT:   64 year old female who  has a past medical history of High cholesterol; Depression; Anxiety; GERD (gastroesophageal reflux disease); Insomnia; Arthritis; Memory loss; and Emotional depression (02/04/2015). today was brought to the hospital by her sister and caregiver due to several falls over the past few days. Patient has not been eating and drinking well for past 3 weeks.   Pt reports eating lunch today (documented that pt refused her tray). Pt states a family member is bringing her a subway sandwich to eat. Pt has had supplements in the past but declines them at this time. Encouraged pt to request Ensure, ice cream, pudding ,etc when her appetite is low.  Per H&P, pt has not been eating well x 3 weeks. Pt has been ordered Megace. Pt states the Megace is helping her appetite.  Per weight history, pt has lost 11 lb since 2/16 (7% weight loss x 5 months, insignificant for time frame).  Nutrition-Focused physical exam completed. Findings are moderate fat depletion, severe muscle depletion, and no edema.   Labs reviewed: Low K  Diet Order:  Diet regular Room service appropriate?: Yes; Fluid consistency:: Thin  Skin:  Reviewed, no issues  Last BM:  7/17  Height:   Ht Readings from Last 1 Encounters:  07/03/15 5\' 3"  (1.6 m)    Weight:   Wt Readings from Last 1 Encounters:  07/03/15 142 lb  4.8 oz (64.547 kg)    Ideal Body Weight:  52.3 kg  Wt Readings from Last 10 Encounters:  07/03/15 142 lb 4.8 oz (64.547 kg)  06/13/15 143 lb 12.8 oz (65.227 kg)  05/24/15 141 lb (63.957 kg)  05/23/15 147 lb (66.679 kg)  03/22/15 147 lb 6.4 oz (66.86 kg)  03/10/15 145 lb (65.772 kg)  03/02/15 143 lb (64.864 kg)  02/22/15 146 lb (66.225 kg)  02/04/15 150 lb (68.04 kg)  02/01/15 153 lb 6.4 oz (69.582 kg)    BMI:  Body mass index is 25.21 kg/(m^2).  Estimated Nutritional Needs:   Kcal:  1600-1800  Protein:  80-90g  Fluid:  1.7L/day  EDUCATION NEEDS:   Education needs addressed  Clayton Bibles, MS, RD, LDN Pager: [phone] After Hours Pager: [phone]

## 2015-07-04 NOTE — Consult Note (Signed)
[facility] Face-to-Face Psychiatry Consult   Reason for Consult:  Depression medication management Referring Physician: Dr. Darrick Meigs Patient Identification: Kendra Simmons MRN:  [id number] Principal Diagnosis: MDD (major depressive disorder), recurrent, severe, with psychosis Diagnosis:   Patient Active Problem List   Diagnosis Date Noted  . Dehydration [E86.0] 07/03/2015  . UTI (lower urinary tract infection) [N39.0] 07/03/2015  . Hypokalemia [E87.6] 07/03/2015  . AKI (acute kidney injury) [N17.9] 07/03/2015  . Tachycardia [R00.0] 05/27/2015  . MDD (major depressive disorder), recurrent, severe, with psychosis [F33.3] 05/25/2015  . 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 Snoring [R06.83] 12/24/2014  . Unintended weight loss [R63.4] 12/24/2014  . 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 Panic attacks [F41.0] 07/26/2014  . Thrombocytopenia [D69.6] 12/17/2012  . DYSLIPIDEMIA [E78.5] 03/13/2010  . GERD [K21.9] 03/13/2010    Total Time spent with patient: 1 hour  Subjective:   Kendra Simmons is a 64 y.o. female patient admitted with syncopal episodes and multiple medication changes  HPI: Kendra Simmons is a 64 years old female seen with psychiatric clinic the social service and chart reviewed for psychiatric consultation and evaluation of depression and medication management. Patient presented to the [facility] long hospital due to several falls over the past few days. Patient is also known for major depressive disorder recurrent with the insomnia. Patient reported she was recently admitted to behavioral [facility] about 4 weeks ago for similar clinical symptoms. Patient was seen by Dr. Adele Schilder and made appropriate medication changes during last visitation about 2 weeks ago. Patient has denied suicidal/homicidal ideation, intention or plans. Patient  has no evidence of psychosis. Patient has been compliant with her medications Celexa Remeron and Seroquel. Patient also suffering with urinary tract infection and started treating it IV antibiotics. Patient is willing to continue taking her psych appropriate medications and also willing to adjust medication as clinically required.  HPI Elements: Location:  Depression and anxiety. Quality:  Fair. Severity:  Recent medication changes. Timing:  Frequent falling. Duration:  Few days. Context:  Unknown psychosocial and medical problems.  Past Medical History:  Past Medical History  Diagnosis Date  . High cholesterol   . Depression   . Anxiety   . GERD (gastroesophageal reflux disease)   . Insomnia   . Arthritis   . Memory loss   . Emotional depression 02/04/2015    Past Surgical History  Procedure Laterality Date  . Cosmetic surgery    . Cesarean section    . Laproscopy     Family History:  Family History  Problem Relation Age of Onset  . Sleep apnea Father   . Alcohol abuse Father   . Diabetes Mother   . Diabetes Sister   . Diabetes Maternal Uncle   . Diabetes Cousin    Social History:  History  Alcohol Use No    Comment: zero     History  Drug Use No    Comment: Denies any drug use other than benzos    History   Social History  . Marital Status: Divorced    Spouse Name: N/A  . Number of Children: 3  . Years of Education: 14   Occupational History  . Retired    Social History Main Topics  . Smoking status: Never Smoker   . Smokeless tobacco: Never Used  . Alcohol Use: No     Comment: zero  . Drug Use: No  Comment: Denies any drug use other than benzos  . Sexual Activity: No   Other Topics Concern  . None   Social History Narrative   Patient lives at home alone.   Caffeine Use:  2 cokes daily   Sister lives next door.   Additional Social History:                          Allergies:  No Known Allergies  Labs:  Results for orders  placed or performed during the hospital encounter of 07/03/15 (from the past 48 hour(s))  CBC with Differential/Platelet     Status: Abnormal   Collection Time: 07/03/15  1:15 PM  Result Value Ref Range   WBC 5.0 4.0 - 10.5 K/uL   RBC 3.66 (L) 3.87 - 5.11 MIL/uL   Hemoglobin 11.4 (L) 12.0 - 15.0 g/dL   HCT 34.3 (L) 36.0 - 46.0 %   MCV 93.7 78.0 - 100.0 fL   MCH 31.1 26.0 - 34.0 pg   MCHC 33.2 30.0 - 36.0 g/dL   RDW 12.5 11.5 - 15.5 %   Platelets 125 (L) 150 - 400 K/uL   Neutrophils Relative % 58 43 - 77 %   Neutro Abs 2.9 1.7 - 7.7 K/uL   Lymphocytes Relative 31 12 - 46 %   Lymphs Abs 1.5 0.7 - 4.0 K/uL   Monocytes Relative 8 3 - 12 %   Monocytes Absolute 0.4 0.1 - 1.0 K/uL   Eosinophils Relative 3 0 - 5 %   Eosinophils Absolute 0.1 0.0 - 0.7 K/uL   Basophils Relative 0 0 - 1 %   Basophils Absolute 0.0 0.0 - 0.1 K/uL  Comprehensive metabolic panel     Status: Abnormal   Collection Time: 07/03/15  1:15 PM  Result Value Ref Range   Sodium 143 135 - 145 mmol/L   Potassium 3.3 (L) 3.5 - 5.1 mmol/L   Chloride 114 (H) 101 - 111 mmol/L   CO2 20 (L) 22 - 32 mmol/L   Glucose, Bld 92 65 - 99 mg/dL   BUN 19 6 - 20 mg/dL   Creatinine, Ser 1.41 (H) 0.44 - 1.00 mg/dL   Calcium 9.4 8.9 - 10.3 mg/dL   Total Protein 6.5 6.5 - 8.1 g/dL   Albumin 3.9 3.5 - 5.0 g/dL   AST 25 15 - 41 U/L   ALT 17 14 - 54 U/L   Alkaline Phosphatase 48 38 - 126 U/L   Total Bilirubin 0.5 0.3 - 1.2 mg/dL   GFR calc non Af Amer 38 (L) >60 mL/min   GFR calc Af Amer 45 (L) >60 mL/min    Comment: (NOTE) The eGFR has been calculated using the CKD EPI equation. This calculation has not been validated in all clinical situations. eGFR's persistently <60 mL/min signify possible Chronic Kidney Disease.    Anion gap 9 5 - 15  Troponin I     Status: None   Collection Time: 07/03/15  1:15 PM  Result Value Ref Range   Troponin I <0.03 <0.031 ng/mL    Comment:        NO INDICATION OF MYOCARDIAL INJURY.   I-Stat CG4  Lactic Acid, ED     Status: None   Collection Time: 07/03/15  1:26 PM  Result Value Ref Range   Lactic Acid, Venous 1.54 0.5 - 2.0 mmol/L  Urinalysis, Routine w reflex microscopic (not at [facility])     Status: Abnormal  Collection Time: 07/03/15  2:20 PM  Result Value Ref Range   Color, Urine AMBER (A) YELLOW    Comment: BIOCHEMICALS MAY BE AFFECTED BY COLOR   APPearance CLOUDY (A) CLEAR   Specific Gravity, Urine 1.027 1.005 - 1.030   pH 6.0 5.0 - 8.0   Glucose, UA NEGATIVE NEGATIVE mg/dL   Hgb urine dipstick SMALL (A) NEGATIVE   Bilirubin Urine SMALL (A) NEGATIVE   Ketones, ur NEGATIVE NEGATIVE mg/dL   Protein, ur 30 (A) NEGATIVE mg/dL   Urobilinogen, UA 0.2 0.0 - 1.0 mg/dL   Nitrite NEGATIVE NEGATIVE   Leukocytes, UA LARGE (A) NEGATIVE  Urine microscopic-add on     Status: Abnormal   Collection Time: 07/03/15  2:20 PM  Result Value Ref Range   Squamous Epithelial / LPF MANY (A) RARE   WBC, UA TOO NUMEROUS TO COUNT <3 WBC/hpf   Bacteria, UA MANY (A) RARE  TSH     Status: None   Collection Time: 07/04/15  4:28 AM  Result Value Ref Range   TSH 1.523 0.350 - 4.500 uIU/mL  CBC     Status: Abnormal   Collection Time: 07/04/15  4:28 AM  Result Value Ref Range   WBC 3.8 (L) 4.0 - 10.5 K/uL   RBC 3.25 (L) 3.87 - 5.11 MIL/uL   Hemoglobin 10.1 (L) 12.0 - 15.0 g/dL   HCT 30.2 (L) 36.0 - 46.0 %   MCV 92.9 78.0 - 100.0 fL   MCH 31.1 26.0 - 34.0 pg   MCHC 33.4 30.0 - 36.0 g/dL   RDW 12.6 11.5 - 15.5 %   Platelets 108 (L) 150 - 400 K/uL    Comment: RESULT REPEATED AND VERIFIED SPECIMEN CHECKED FOR CLOTS PLATELET COUNT CONFIRMED BY SMEAR   Comprehensive metabolic panel     Status: Abnormal   Collection Time: 07/04/15  4:28 AM  Result Value Ref Range   Sodium 142 135 - 145 mmol/L   Potassium 3.0 (L) 3.5 - 5.1 mmol/L   Chloride 116 (H) 101 - 111 mmol/L   CO2 20 (L) 22 - 32 mmol/L   Glucose, Bld 109 (H) 65 - 99 mg/dL   BUN 10 6 - 20 mg/dL   Creatinine, Ser 0.70 0.44 - 1.00 mg/dL    Calcium 8.5 (L) 8.9 - 10.3 mg/dL   Total Protein 5.5 (L) 6.5 - 8.1 g/dL   Albumin 3.4 (L) 3.5 - 5.0 g/dL   AST 21 15 - 41 U/L   ALT 16 14 - 54 U/L   Alkaline Phosphatase 42 38 - 126 U/L   Total Bilirubin 0.7 0.3 - 1.2 mg/dL   GFR calc non Af Amer >60 >60 mL/min   GFR calc Af Amer >60 >60 mL/min    Comment: (NOTE) The eGFR has been calculated using the CKD EPI equation. This calculation has not been validated in all clinical situations. eGFR's persistently <60 mL/min signify possible Chronic Kidney Disease.    Anion gap 6 5 - 15    Vitals: Blood pressure 100/60, pulse 87, temperature 98.8 F (37.1 C), temperature source Oral, resp. rate 18, height 5' 3" (1.6 m), weight 64.547 kg (142 lb 4.8 oz), SpO2 99 %.  Risk to Self: Is patient at risk for suicide?: No Risk to Others:   Prior Inpatient Therapy:   Prior Outpatient Therapy:    Current Facility-Administered Medications  Medication Dose Route Frequency Provider Last Rate Last Dose  . acetaminophen (TYLENOL) tablet 1,000 mg  1,000 mg Oral Q6H PRN  Oswald Hillock, MD   1,000 mg at 07/04/15 1779  . cefTRIAXone (ROCEPHIN) 1 g in dextrose 5 % 50 mL IVPB  1 g Intravenous Q24H Lacretia Leigh, MD 100 mL/hr at 07/03/15 1501 1 g at 07/03/15 1501  . citalopram (CELEXA) tablet 10 mg  10 mg Oral Daily Oswald Hillock, MD   10 mg at 07/03/15 2115  . clonazePAM (KLONOPIN) tablet 0.5 mg  0.5 mg Oral QHS Oswald Hillock, MD   0.5 mg at 07/03/15 2110  . dextrose 5 % and 0.45 % NaCl with KCl 20 mEq/L infusion   Intravenous Continuous Oswald Hillock, MD 10 mL/hr at 07/04/15 1002 10 mL/hr at 07/04/15 1002  . enoxaparin (LOVENOX) injection 40 mg  40 mg Subcutaneous Q24H Oswald Hillock, MD   40 mg at 07/03/15 2200  . megestrol (MEGACE) 400 MG/10ML suspension 400 mg  400 mg Oral Daily Oswald Hillock, MD   400 mg at 07/04/15 1005  . memantine (NAMENDA) tablet 10 mg  10 mg Oral BID Oswald Hillock, MD   10 mg at 07/04/15 1005  . mirtazapine (REMERON) tablet 15 mg  15 mg Oral  QHS Oswald Hillock, MD   15 mg at 07/03/15 2110  . ondansetron (ZOFRAN) tablet 4 mg  4 mg Oral Q6H PRN Oswald Hillock, MD       Or  . ondansetron (ZOFRAN) injection 4 mg  4 mg Intravenous Q6H PRN Oswald Hillock, MD      . pantoprazole (PROTONIX) EC tablet 40 mg  40 mg Oral Daily Oswald Hillock, MD   40 mg at 07/04/15 1005  . potassium chloride 10 mEq in 100 mL IVPB  10 mEq Intravenous Q1 Hr x 3 Oswald Hillock, MD   10 mEq at 07/04/15 1006  . potassium chloride SA (K-DUR,KLOR-CON) CR tablet 40 mEq  40 mEq Oral Once Oswald Hillock, MD   40 mEq at 07/03/15 1801  . QUEtiapine (SEROQUEL) tablet 400 mg  400 mg Oral QHS Oswald Hillock, MD   400 mg at 07/03/15 2110  . tamsulosin (FLOMAX) capsule 0.4 mg  0.4 mg Oral Daily Oswald Hillock, MD   0.4 mg at 07/04/15 1005    Musculoskeletal: Strength & Muscle Tone: decreased Gait & Station: unable to stand Patient leans: N/A  Psychiatric Specialty Exam: Physical Exam as per history and physical   ROS depression, anxiety, frequent urination, generalized weakness and frequent falls No Fever-chills, No Headache, No changes with Vision or hearing, reports vertigo No problems swallowing food or Liquids, No Chest pain, Cough or Shortness of Breath, No Abdominal pain, No Nausea or Vommitting, Bowel movements are regular, No Blood in stool or Urine, No dysuria, No new skin rashes or bruises, No new joints pains-aches,  No new weakness, tingling, numbness in any extremity, No recent weight gain or loss, No polyuria, polydypsia or polyphagia,   A full 10 point Review of Systems was done, except as stated above, all other Review of Systems were negative.   Blood pressure 100/60, pulse 87, temperature 98.8 F (37.1 C), temperature source Oral, resp. rate 18, height 5' 3" (1.6 m), weight 64.547 kg (142 lb 4.8 oz), SpO2 99 %.Body mass index is 25.21 kg/(m^2).  General Appearance: Guarded  Eye Contact::  Good  Speech:  Clear and Coherent  Volume:  Decreased  Mood:   Anxious and Depressed  Affect:  Appropriate and Congruent  Thought Process:  Coherent and Goal  Directed  Orientation:  Full (Time, Place, and Person)  Thought Content:  WDL  Suicidal Thoughts:  No  Homicidal Thoughts:  No  Memory:  Immediate;   Fair Recent;   Fair  Judgement:  Intact  Insight:  Fair  Psychomotor Activity:  Decreased  Concentration:  Fair  Recall:  [employer] of Knowledge:Good  Language: Good  Akathisia:  Negative  Handed:  Right  AIMS (if indicated):     Assets:  Communication Skills Desire for Improvement Financial Resources/Insurance Housing Intimacy Leisure Time Resilience Social Support  ADL's:  Impaired  Cognition: WNL  Sleep:      Medical Decision Making: New problem, with additional work up planned, Review of Psycho-Social Stressors (1), Review or order clinical lab tests (1), Review of Last Therapy Session (1), Review or order medicine tests (1), Review of Medication Regimen & Side Effects (2) and Review of New Medication or Change in Dosage (2)  Treatment Plan Summary: Patient presented with increased symptoms of depression anxiety and recent urinary tract infection and frequent falls. Patient has no suicidal or homicidal ideation and psychosis. Daily contact with patient to assess and evaluate symptoms and progress in treatment and Medication management  Plan: Depression: Seroquel 400 mg at bedtime and Increase citalopram 20 mg daily for depression Insomnia: Continue Remeron 15 mg at bedtime Patient does not meet criteria for psychiatric inpatient admission. Supportive therapy provided about ongoing stressors. Appreciate psychiatric consultation and follow up as clinically required Please contact [phone] or [phone] if needs further assistance  Disposition: Patient may be discharged outpatient psychiatric medication management and medically stable.   Kinser Fellman,JANARDHAHA R. 07/04/2015 10:08 AM

## 2015-07-05 LAB — BASIC METABOLIC PANEL
ANION GAP: 5 (ref 5–15)
BUN: 8 mg/dL (ref 6–20)
CO2: 20 mmol/L — AB (ref 22–32)
Calcium: 8.5 mg/dL — ABNORMAL LOW (ref 8.9–10.3)
Chloride: 116 mmol/L — ABNORMAL HIGH (ref 101–111)
Creatinine, Ser: 0.75 mg/dL (ref 0.44–1.00)
GFR calc Af Amer: >60 mL/min (ref >60)
GLUCOSE: 93 mg/dL (ref 65–99)
POTASSIUM: 3.6 mmol/L (ref 3.5–5.1)
Sodium: 141 mmol/L (ref 135–145)

## 2015-07-05 LAB — MAGNESIUM: Magnesium: 1.8 mg/dL (ref 1.7–2.4)

## 2015-07-05 NOTE — Progress Notes (Signed)
Foley cath removed per order tolerated well.instructions given for bathroom help

## 2015-07-05 NOTE — Clinical Social Work Placement (Signed)
   CLINICAL SOCIAL WORK PLACEMENT  NOTE  Date:  07/05/2015  Patient Details  Name: GLADYSE CORVIN MRN: [id number] Date of Birth: 07/31/1951  Clinical Social Work is seeking post-discharge placement for this patient at the [place] level of care (*CSW will initial, date and re-position this form in  chart as items are completed):  Yes   Patient/family provided with [facility] Work Department's list of facilities offering this level of care within the geographic area requested by the patient (or if unable, by the patient's family).  Yes   Patient/family informed of their freedom to choose among providers that offer the needed level of care, that participate in Medicare, Medicaid or managed care program needed by the patient, have an available bed and are willing to accept the patient.  Yes   Patient/family informed of [facility]'s ownership interest in [facility] and [facility], as well as of the fact that they are under no obligation to receive care at these facilities.  PASRR submitted to EDS on 07/05/15     PASRR number received on 07/05/15     Existing PASRR number confirmed on       FL2 transmitted to all facilities in geographic area requested by pt/family on 07/05/15     FL2 transmitted to all facilities within larger geographic area on       Patient informed that his/her managed care company has contracts with or will negotiate with certain facilities, including the following:        Yes   Patient/family informed of bed offers received.  Patient chooses bed at       Physician recommends and patient chooses bed at      Patient to be transferred to   on  .  Patient to be transferred to facility by       Patient family notified on   of transfer.  Name of family member notified:        PHYSICIAN       Additional Comment:    _______________________________________________ Standley Brooking, LCSW 07/05/2015, 2:35  PM

## 2015-07-05 NOTE — Progress Notes (Signed)
[facility] PROGRESS NOTE  Kendra Simmons BHA:[id number] DOB: May 22, 1951 DOA: 07/03/2015 PCP: Kendra Logan, MD  Assessment/Plan:  Acute kidney injury resolved, today creatinine 0.75 Due to dehydration and poor by mouth intake.   UTI Patient with abnormal UA, started Rocephin in the ED.  No urine cultures obtained in the ED Will complete 5 days therapy of Rocephin, today is day #3  Hypokalemia Replacedpotassium  Hyperchloremic metabolic acidosis Patient has mild hyperchloremic metabolic acidosis Continue D5 half normal saline +20 of KCl  Depression Patient has severe depression, feels hopeless not eating. Has been started on Celexa, mirtazapine with no significant effect. Psychiatry consulted and patient started on Seroquel  Anorexia Continue Megace  DVT prophylaxis Lovenox   Code Status: DO NOT RESUSCITATE Family Communication: *No family at bedside Disposition Plan: Skilled nursing facility   Consultants:  Psychiatry  Procedures:  None  Antibiotics:  Rocephin  HPI/Subjective: 64 year old female who  has a past medical history of High cholesterol; Depression; Anxiety; GERD (gastroesophageal reflux disease); Insomnia; Arthritis; Memory loss; and Emotional depression (02/04/2015). today was brought to the hospital by her sister and caregiver due to several falls over the past few days. Patient has not been eating and drinking well for past 3 weeks. Patient says that she feels hopeless and is concerned about her future. She denies suicidal ideation. Patient has a history of depression and dementia and is taken care of at home by her caregivers. She also endorses some diarrhea but no vomiting or abdominal pain. In the ED patient was found to have hypotension blood pressure was 80/56. After 500 of saline bolus blood pressure improved to 112/60.  This morning patient feels better, patient says that she is eating better though she did not sleep well last night.  She was started on Seroquel by psych   Objective: Filed Vitals:   07/05/15 0634  BP: 122/59  Pulse: 80  Temp: 98.4 F (36.9 C)  Resp: 18    Intake/Output Summary (Last 24 hours) at 07/05/15 1423 Last data filed at 07/05/15 9735  Gross per 24 hour  Intake    580 ml  Output   1075 ml  Net   -495 ml   Filed Weights   07/03/15 1555  Weight: 64.547 kg (142 lb 4.8 oz)    Exam:   General:  Appears in no acute distress  Cardiovascular: S1-S2 regular  Respiratory: Clear to auscultation bilaterally  Abdomen: Soft, nontender, no organomegaly  Musculoskeletal: No edema of the lower extremities   Data Reviewed: Basic Metabolic Panel:  Recent Labs Lab 07/03/15 1315 07/04/15 0428 07/05/15 0428  NA 143 142 141  K 3.3* 3.0* 3.6  CL 114* 116* 116*  CO2 20* 20* 20*  GLUCOSE 92 109* 93  BUN 19 10 8   CREATININE 1.41* 0.70 0.75  CALCIUM 9.4 8.5* 8.5*  MG  --   --  1.8   Liver Function Tests:  Recent Labs Lab 07/03/15 1315 07/04/15 0428  AST 25 21  ALT 17 16  ALKPHOS 48 42  BILITOT 0.5 0.7  PROT 6.5 5.5*  ALBUMIN 3.9 3.4*   No results for input(s): LIPASE, AMYLASE in the last 168 hours. No results for input(s): AMMONIA in the last 168 hours. CBC:  Recent Labs Lab 07/03/15 1315 07/04/15 0428  WBC 5.0 3.8*  NEUTROABS 2.9  --   HGB 11.4* 10.1*  HCT 34.3* 30.2*  MCV 93.7 92.9  PLT 125* 108*   Cardiac Enzymes:  Recent Labs Lab 07/03/15 1315  TROPONINI <0.03   Studies: No results found.  Scheduled Meds: . cefTRIAXone (ROCEPHIN)  IV  1 g Intravenous Q24H  . citalopram  20 mg Oral Daily  . clonazePAM  0.5 mg Oral QHS  . enoxaparin (LOVENOX) injection  40 mg Subcutaneous Q24H  . megestrol  400 mg Oral Daily  . memantine  10 mg Oral BID  . mirtazapine  15 mg Oral QHS  . pantoprazole  40 mg Oral Daily  . potassium chloride  40 mEq Oral Once  . QUEtiapine  400 mg Oral QHS  . tamsulosin  0.4 mg Oral Daily   Continuous Infusions:    Principal  Problem:   MDD (major depressive disorder), recurrent, severe, with psychosis Active Problems:   Dehydration   UTI (lower urinary tract infection)   Hypokalemia   AKI (acute kidney injury)    Time spent: 25 min    [facility] Pager [phone]. If 7PM-7AM, please contact night-coverage at [email or web address], password [facility] 07/05/2015, 2:23 PM  LOS: 2 days            Was at

## 2015-07-05 NOTE — Clinical Social Work Note (Signed)
Clinical Social Work Assessment  Patient Details  Name: Kendra Simmons MRN: [id number] Date of Birth: [birth year]  Date of referral:  07/05/15               Reason for consult:  Facility Placement                Permission sought to share information with:  Chartered certified accountant granted to share information::  Yes, Verbal Permission Granted  Name::        Agency::     Relationship::     Contact Information:     Housing/Transportation Living arrangements for the past 2 months:  Single Family Home Source of Information:  Patient, Other (Comment Required) (siblings - [name] at bedside) Patient Interpreter Needed:  None Criminal Activity/Legal Involvement Pertinent to Current Situation/Hospitalization:  No - Comment as needed Significant Relationships:  Siblings Lives with:  Siblings Do you feel safe going back to the place where you live?  No Need for family participation in patient care:  Yes (Comment)  Care giving concerns:  CSW received consult for SNF placement.    Social Worker assessment / plan:  CSW spoke with patient's sisters at bedside to confirm that they are agreeable with plan for SNF at discharge.   Employment status:    Insurance information:  Medicare PT Recommendations:  [place] / Referral to community resources:  [place]  Patient/Family's Response to care:  CSW reviewed PT evaluation recommending SNF at discharge - patient & sisters agreeable. CSW provided patient/sisters with SNF bed offers.   Patient/Family's Understanding of and Emotional Response to Diagnosis, Current Treatment, and Prognosis:    Emotional Assessment Appearance:  Appears stated age Attitude/Demeanor/Rapport:    Affect (typically observed):  Flat, Quiet, Calm Orientation:  Oriented to Self, Oriented to Place, Oriented to  Time, Oriented to Situation Alcohol / Substance use:    Psych involvement (Current and /or in the  community):     Discharge Needs  Concerns to be addressed:    Readmission within the last 30 days:    Current discharge risk:    Barriers to Discharge:      Standley Brooking, LCSW 07/05/2015, 2:34 PM

## 2015-07-05 NOTE — Evaluation (Signed)
Physical Therapy Evaluation Patient Details Name: Kendra Simmons MRN: [id number] DOB: 10-12-1951 Today's Date: 07/05/2015   History of Present Illness  64 year old female who has a past medical history of High cholesterol; Depression; Anxiety; GERD (gastroesophageal reflux disease); Insomnia; Arthritis; Memory loss; and Emotional depression (02/04/2015). today was brought to the hospital by her sister and caregiver due to several falls over the past few days. Patient has not been eating and drinking well for past 3 weeks. Patient says that she feels hopeless and is concerned about her future. She denies suicidal ideation. Patient has a history of depression and dementia and is taken care of at home by her caregivers. She also endorses some diarrhea but no vomiting or abdominal pain. In the ED patient was found to have hypotension blood pressure was 80/56. After 500 of saline bolus blood pressure improved to 112/60.  Clinical Impression  Patient  With very flat affect. Stating " I have the feeling of hopelessness". Family/caregivers present and agree with Short term snf rehab. Patient will benefit from PT to address problems listed in note below. HR max 138, BP after ambulating 117/56,    Follow Up Recommendations SNF;Supervision/Assistance - 24 hour    Equipment Recommendations       Recommendations for Other Services       Precautions / Restrictions Precautions Precautions: Fall Precaution Comments: monitor VS, hypotn and incr. HR      Mobility  Bed Mobility Overal bed mobility: Needs Assistance Bed Mobility: Supine to Sit     Supine to sit: Supervision        Transfers Overall transfer level: Needs assistance Equipment used: Rolling walker (2 wheeled) Transfers: Sit to/from Stand           General transfer comment: cues for safety  Ambulation/Gait Ambulation/Gait assistance: Min assist Ambulation Distance (Feet): 50 Feet Assistive device: Rolling walker (2  wheeled) Gait Pattern/deviations: Step-to pattern     General Gait Details: cues for safety  Stairs            Wheelchair Mobility    Modified Rankin (Stroke Patients Only)       Balance Overall balance assessment: History of Falls;Needs assistance Sitting-balance support: Feet supported;Bilateral upper extremity supported Sitting balance-Leahy Scale: Fair     Standing balance support: During functional activity;No upper extremity supported Standing balance-Leahy Scale: Poor                               Pertinent Vitals/Pain Pain Assessment: No/denies pain    Home Living Family/patient expects to be discharged to:: Private residence Living Arrangements: Alone Available Help at Discharge: Family Type of Home: House Home Access: Ramped entrance     Home Layout: One level        Prior Function Level of Independence: Needs assistance   Gait / Transfers Assistance Needed: with meals, with bath,           Hand Dominance        Extremity/Trunk Assessment   Upper Extremity Assessment: Generalized weakness           Lower Extremity Assessment: Generalized weakness      Cervical / Trunk Assessment: Normal  Communication      Cognition Arousal/Alertness: Awake/alert Behavior During Therapy: Flat affect Overall Cognitive Status: History of cognitive impairments - at baseline       Memory: Decreased short-term memory;Decreased recall of precautions  General Comments      Exercises        Assessment/Plan    PT Assessment Patient needs continued PT services  PT Diagnosis Difficulty walking;Generalized weakness;Altered mental status   PT Problem List Decreased strength;Decreased activity tolerance;Decreased mobility;Decreased cognition;Decreased knowledge of use of DME;Decreased safety awareness;Decreased knowledge of precautions;Cardiopulmonary status limiting activity  PT Treatment Interventions DME  instruction;Gait training;Functional mobility training;Therapeutic activities;Therapeutic exercise;Patient/family education   PT Goals (Current goals can be found in the Care Plan section) Acute Rehab PT Goals Patient Stated Goal: agreed to walk. PT Goal Formulation: With patient/family Time For Goal Achievement: 07/19/15 Potential to Achieve Goals: Good    Frequency Min 3X/week   Barriers to discharge Decreased caregiver support family has gaps in caregiver schedule.    Co-evaluation               End of Session Equipment Utilized During Treatment: Gait belt Activity Tolerance: Treatment limited secondary to medical complications (Comment) (incr. HR ) Patient left: in chair;with call bell/phone within reach;with family/visitor present Nurse Communication: Mobility status         Time: 1317-1340 PT Time Calculation (min) (ACUTE ONLY): 23 min   Charges:   PT Evaluation $Initial PT Evaluation Tier I: 1 Procedure PT Treatments $Gait Training: 8-22 mins   PT G Codes:        Claretha Cooper 07/05/2015, 1:54 PM Tresa Endo PT [phone]

## 2015-07-05 NOTE — Care Management Important Message (Signed)
Important Message  Patient Details  Name: PRAPTI GRUSSING MRN: [id number] Date of Birth: 09/07/1951   Medicare Important Message Given:  Yes-second notification given    Camillo Flaming 07/05/2015, 11:48 AMImportant Message  Patient Details  Name: SADAKO CEGIELSKI MRN: [id number] Date of Birth: 1951-10-28   Medicare Important Message Given:  Yes-second notification given    Camillo Flaming 07/05/2015, 11:48 AM

## 2015-07-06 DIAGNOSIS — R630 Anorexia: Secondary | ICD-10-CM | POA: Diagnosis not present

## 2015-07-06 DIAGNOSIS — M25662 Stiffness of left knee, not elsewhere classified: Secondary | ICD-10-CM | POA: Diagnosis not present

## 2015-07-06 DIAGNOSIS — M6281 Muscle weakness (generalized): Secondary | ICD-10-CM | POA: Diagnosis not present

## 2015-07-06 DIAGNOSIS — E860 Dehydration: Secondary | ICD-10-CM

## 2015-07-06 DIAGNOSIS — E876 Hypokalemia: Secondary | ICD-10-CM | POA: Diagnosis not present

## 2015-07-06 DIAGNOSIS — R5381 Other malaise: Secondary | ICD-10-CM | POA: Diagnosis not present

## 2015-07-06 DIAGNOSIS — N179 Acute kidney failure, unspecified: Principal | ICD-10-CM

## 2015-07-06 DIAGNOSIS — F0390 Unspecified dementia without behavioral disturbance: Secondary | ICD-10-CM | POA: Diagnosis not present

## 2015-07-06 DIAGNOSIS — F333 Major depressive disorder, recurrent, severe with psychotic symptoms: Secondary | ICD-10-CM | POA: Diagnosis not present

## 2015-07-06 DIAGNOSIS — F410 Panic disorder [episodic paroxysmal anxiety] without agoraphobia: Secondary | ICD-10-CM | POA: Diagnosis not present

## 2015-07-06 DIAGNOSIS — N390 Urinary tract infection, site not specified: Secondary | ICD-10-CM

## 2015-07-06 DIAGNOSIS — R262 Difficulty in walking, not elsewhere classified: Secondary | ICD-10-CM | POA: Diagnosis not present

## 2015-07-06 DIAGNOSIS — M1990 Unspecified osteoarthritis, unspecified site: Secondary | ICD-10-CM | POA: Diagnosis not present

## 2015-07-06 DIAGNOSIS — R358 Other polyuria: Secondary | ICD-10-CM | POA: Diagnosis not present

## 2015-07-06 DIAGNOSIS — F339 Major depressive disorder, recurrent, unspecified: Secondary | ICD-10-CM | POA: Diagnosis not present

## 2015-07-06 DIAGNOSIS — E780 Pure hypercholesterolemia: Secondary | ICD-10-CM | POA: Diagnosis not present

## 2015-07-06 DIAGNOSIS — F29 Unspecified psychosis not due to a substance or known physiological condition: Secondary | ICD-10-CM | POA: Diagnosis not present

## 2015-07-06 DIAGNOSIS — F419 Anxiety disorder, unspecified: Secondary | ICD-10-CM | POA: Diagnosis not present

## 2015-07-06 DIAGNOSIS — R627 Adult failure to thrive: Secondary | ICD-10-CM | POA: Diagnosis not present

## 2015-07-06 DIAGNOSIS — K219 Gastro-esophageal reflux disease without esophagitis: Secondary | ICD-10-CM | POA: Diagnosis not present

## 2015-07-06 LAB — BASIC METABOLIC PANEL
Anion gap: 8 (ref 5–15)
BUN: 10 mg/dL (ref 6–20)
CALCIUM: 8.8 mg/dL — AB (ref 8.9–10.3)
CO2: 22 mmol/L (ref 22–32)
Chloride: 112 mmol/L — ABNORMAL HIGH (ref 101–111)
Creatinine, Ser: 0.73 mg/dL (ref 0.44–1.00)
Glucose, Bld: 88 mg/dL (ref 65–99)
Potassium: 3.6 mmol/L (ref 3.5–5.1)
Sodium: 142 mmol/L (ref 135–145)

## 2015-07-06 MED ORDER — CEPHALEXIN 500 MG PO CAPS: 500.0000 mg | ORAL_CAPSULE | Freq: Two times a day (BID) | ORAL | Status: DC

## 2015-07-06 MED ORDER — CITALOPRAM HYDROBROMIDE 20 MG PO TABS: 20.0000 mg | ORAL_TABLET | Freq: Every day | ORAL | Status: DC

## 2015-07-06 NOTE — Clinical Social Work Placement (Signed)
Patient is set to discharge to [facility] today. Patient & sister, Kendra Simmons at bedside aware. Discharge packet given to RN, Shirlee Limerick. Patient's sister, Kendra Simmons to transport to SNF.    Raynaldo Opitz, [facility] Clinical Social Worker cell #: [phone]    CLINICAL SOCIAL WORK PLACEMENT  NOTE  Date:  07/06/2015  Patient Details  Name: Kendra Simmons MRN: [id number] Date of Birth: [birth year]  Clinical Social Work is seeking post-discharge placement for this patient at the [place] level of care (*CSW will initial, date and re-position this form in  chart as items are completed):  Yes   Patient/family provided with [place] Work Department's list of facilities offering this level of care within the geographic area requested by the patient (or if unable, by the patient's family).  Yes   Patient/family informed of their freedom to choose among providers that offer the needed level of care, that participate in Medicare, Medicaid or managed care program needed by the patient, have an available bed and are willing to accept the patient.  Yes   Patient/family informed of [facility]'s ownership interest in [facility] and [facility], as well as of the fact that they are under no obligation to receive care at these facilities.  PASRR submitted to EDS on 07/05/15     PASRR number received on 07/05/15     Existing PASRR number confirmed on       FL2 transmitted to all facilities in geographic area requested by pt/family on 07/05/15     FL2 transmitted to all facilities within larger geographic area on       Patient informed that his/her managed care company has contracts with or will negotiate with certain facilities, including the following:        Yes   Patient/family informed of bed offers received.  Patient chooses bed at [facility]     Physician recommends and patient chooses bed at      Patient  to be transferred to [facility] on 07/06/15.  Patient to be transferred to facility by sister, Jean's car     Patient family notified on 07/06/15 of transfer.  Name of family member notified:  patient's sister, Kendra Simmons at bedside     PHYSICIAN       Additional Comment:    _______________________________________________ Standley Brooking, LCSW 07/06/2015, 12:01 PM

## 2015-07-06 NOTE — Discharge Summary (Signed)
Physician Discharge Summary  Kendra Simmons KGY:[id number] DOB: 06-02-1951 DOA: 07/03/2015  PCP: [name], MD  Admit date: 07/03/2015 Discharge date: 07/06/2015  Time spent: 30 minutes  Recommendations for Outpatient Follow-up:  1. Follow up with PCP in one week.   Discharge Diagnoses:  Principal Problem:   MDD (major depressive disorder), recurrent, severe, with psychosis Active Problems:   Dehydration   UTI (lower urinary tract infection)   Hypokalemia   AKI (acute kidney injury)   Discharge Condition: improved.  Diet recommendation: regular diet.   Filed Weights   07/03/15 1555  Weight: 64.547 kg (142 lb 4.8 oz)    History of present illness:  64 year old female who  has a past medical history of High cholesterol; Depression; Anxiety; GERD (gastroesophageal reflux disease); Insomnia; Arthritis; Memory loss; and Emotional depression (02/04/2015). was brought to the hospital by her sister and caregiver due to several falls over the past few days. She was found to be dehydrated and in acute renal failure along with UTI.   Hospital Course:  Acute kidney injury resolved, today creatinine 0.75 Due to dehydration and poor by mouth intake.   UTI Patient with abnormal UA, started Rocephin in the ED.  No urine cultures obtained in the ED Finished 4 days of IV antibiotics and 3 more days of oral antibiotic to complete the course.   Hypokalemia Replacedpotassium  Hyperchloremic metabolic acidosis Patient has mild hyperchloremic metabolic acidosis Resolved.   Depression Patient has severe depression, feels hopeless not eating. Has been started on Celexa, mirtazapine with no significant effect. Psychiatry consulted and patient started on Seroquel.   Anorexia Continue Megace  Procedures:  none  Consultations:  Psychiatry.   Discharge Exam: Filed Vitals:   07/06/15 0508  BP: 146/68  Pulse: 109  Temp: 98.1 F (36.7 C)  Resp: 18    General: alert  afebrile comfortable Cardiovascular: s1s2 Respiratory: ctab  Discharge Instructions   Discharge Instructions    Discharge instructions    Complete by:  As directed   Follow up with PCP in one week.          Current Discharge Medication List    START taking these medications   Details  cephALEXin (KEFLEX) 500 MG capsule Take 1 capsule (500 mg total) by mouth 2 (two) times daily. Qty: 6 capsule, Refills: 0      CONTINUE these medications which have CHANGED   Details  citalopram (CELEXA) 20 MG tablet Take 1 tablet (20 mg total) by mouth daily.      CONTINUE these medications which have NOT CHANGED   Details  acetaminophen (TYLENOL) 500 MG tablet Take 1,000 mg by mouth every 6 (six) hours as needed for mild pain or headache.    clonazePAM (KLONOPIN) 0.5 MG tablet Take 1 tablet (0.5 mg total) by mouth at bedtime. Qty: 30 tablet, Refills: 2   Associated Diagnoses: Major depressive disorder, recurrent episode, moderate    loperamide (IMODIUM A-D) 2 MG tablet Take 2 mg by mouth 4 (four) times daily as needed for diarrhea or loose stools.    megestrol (MEGACE) 400 MG/10ML suspension Take 10 mLs (400 mg total) by mouth daily. Qty: 240 mL, Refills: 0    memantine (NAMENDA) 10 MG tablet Take 1 tablet (10 mg total) by mouth 2 (two) times daily. Qty: 60 tablet, Refills: 12    mirtazapine (REMERON) 15 MG tablet Take 1 tablet (15 mg total) by mouth at bedtime. Qty: 30 tablet, Refills: 0   Associated Diagnoses: Major depressive disorder,  recurrent episode, mild    omeprazole (PRILOSEC) 20 MG capsule Take 1 capsule (20 mg total) by mouth daily. For acid reflux    pravastatin (PRAVACHOL) 20 MG tablet Take 1 tablet (20 mg total) by mouth at bedtime. For high cholesterol    QUEtiapine (SEROQUEL) 400 MG tablet Take 1 tablet (400 mg total) by mouth at bedtime. Qty: 30 tablet, Refills: 0   Associated Diagnoses: Major depressive disorder, recurrent episode, mild    tamsulosin (FLOMAX)  0.4 MG CAPS capsule Take 1 capsule (0.4 mg total) by mouth daily after supper. For frequent urination/urgency Qty: 15 capsule, Refills: 0    cyanocobalamin (,VITAMIN B-12,) 1000 MCG/ML injection Inject 1 mL (1,000 mcg total) into the skin once a week. For bone health Qty: 1 mL, Refills: 0      STOP taking these medications     ibuprofen (ADVIL,MOTRIN) 200 MG tablet      nystatin (MYCOSTATIN/NYSTOP) 100000 UNIT/GM POWD      loratadine (CLARITIN) 10 MG tablet        No Known Allergies Follow-up Information    Follow up with [name], MD. Schedule an appointment as soon as possible for a visit in 1 week.   Specialty:  Family Medicine   Contact information:   [address] [place] [address] [phone]        The results of significant diagnostics from this hospitalization (including imaging, microbiology, ancillary and laboratory) are listed below for reference.    Significant Diagnostic Studies: Ct Head Wo Contrast  07/03/2015   CLINICAL DATA:  Dizziness, hypotension  EXAM: CT HEAD WITHOUT CONTRAST  TECHNIQUE: Contiguous axial images were obtained from the base of the skull through the vertex without intravenous contrast.  COMPARISON:  02/06/2014  FINDINGS: No skull fracture is noted. Paranasal sinuses and mastoid air cells are unremarkable. No intracranial hemorrhage, mass effect or midline shift. No acute cortical infarction. No mass lesion is noted on this unenhanced scan. Stable cerebral atrophy. Stable periventricular chronic white matter disease.  IMPRESSION: No acute intracranial abnormality. Stable atrophy and chronic white matter disease.   Electronically Signed   By: Lahoma Crocker M.D.   On: 07/03/2015 13:58    Microbiology: No results found for this or any previous visit (from the past 240 hour(s)).   Labs: Basic Metabolic Panel:  Recent Labs Lab 07/03/15 1315 07/04/15 0428 07/05/15 0428 07/06/15 0457  NA 143 142 141 142  K 3.3* 3.0* 3.6  3.6  CL 114* 116* 116* 112*  CO2 20* 20* 20* 22  GLUCOSE 92 109* 93 88  BUN 19 10 8 10   CREATININE 1.41* 0.70 0.75 0.73  CALCIUM 9.4 8.5* 8.5* 8.8*  MG  --   --  1.8  --    Liver Function Tests:  Recent Labs Lab 07/03/15 1315 07/04/15 0428  AST 25 21  ALT 17 16  ALKPHOS 48 42  BILITOT 0.5 0.7  PROT 6.5 5.5*  ALBUMIN 3.9 3.4*   No results for input(s): LIPASE, AMYLASE in the last 168 hours. No results for input(s): AMMONIA in the last 168 hours. CBC:  Recent Labs Lab 07/03/15 1315 07/04/15 0428  WBC 5.0 3.8*  NEUTROABS 2.9  --   HGB 11.4* 10.1*  HCT 34.3* 30.2*  MCV 93.7 92.9  PLT 125* 108*   Cardiac Enzymes:  Recent Labs Lab 07/03/15 1315  TROPONINI <0.03   BNP: BNP (last 3 results) No results for input(s): BNP in the last 8760 hours.  ProBNP (last 3 results) No results for input(s): PROBNP in the last 8760 hours.  CBG: No results for input(s): GLUCAP in the last 168 hours.     SignedHosie Poisson  [facility] 07/06/2015, 11:35 AM

## 2015-07-06 NOTE — Progress Notes (Signed)
Clinical Social Work  CSW met with patient and niece at bedside. Patient laying in bed and reports that she is supposed to go to rehab today. Patient reports she is hopeful she can get stronger quickly so that she can return home quickly. Patient reports she continues to feel depressed but that she plans on following up with her community psychiatrist. Patient's niece reports concern about patient's hygiene and contributes her lack of bathing to feeling depressed. Patient has been cleared by psych MD to follow up on outpatient basis.  Psych CSW is signing off and unit CSW to assist with DC to SNF.  [place], [place] [phone]

## 2015-07-06 NOTE — Progress Notes (Signed)
Discharge to [facility], sister to transport patient. Called the facility 3 times to give report but unable to talk to the nurse, phone continuously just ringing. Spoke to the facility operator  And gave my #  For the nurse to call me.

## 2015-07-07 ENCOUNTER — Other Ambulatory Visit (HOSPITAL_COMMUNITY): Payer: Self-pay | Admitting: Psychiatry

## 2015-07-13 DIAGNOSIS — F419 Anxiety disorder, unspecified: Secondary | ICD-10-CM | POA: Diagnosis not present

## 2015-07-13 DIAGNOSIS — F410 Panic disorder [episodic paroxysmal anxiety] without agoraphobia: Secondary | ICD-10-CM | POA: Diagnosis not present

## 2015-07-13 DIAGNOSIS — F333 Major depressive disorder, recurrent, severe with psychotic symptoms: Secondary | ICD-10-CM | POA: Diagnosis not present

## 2015-07-13 DIAGNOSIS — F0390 Unspecified dementia without behavioral disturbance: Secondary | ICD-10-CM | POA: Diagnosis not present

## 2015-07-20 DIAGNOSIS — R262 Difficulty in walking, not elsewhere classified: Secondary | ICD-10-CM | POA: Diagnosis not present

## 2015-07-20 DIAGNOSIS — M6281 Muscle weakness (generalized): Secondary | ICD-10-CM | POA: Diagnosis not present

## 2015-07-20 DIAGNOSIS — R5381 Other malaise: Secondary | ICD-10-CM | POA: Diagnosis not present

## 2015-07-20 DIAGNOSIS — M25662 Stiffness of left knee, not elsewhere classified: Secondary | ICD-10-CM | POA: Diagnosis not present

## 2015-07-21 ENCOUNTER — Ambulatory Visit (HOSPITAL_COMMUNITY): Payer: Self-pay | Admitting: Clinical

## 2015-07-21 DIAGNOSIS — K219 Gastro-esophageal reflux disease without esophagitis: Secondary | ICD-10-CM | POA: Diagnosis not present

## 2015-07-21 DIAGNOSIS — F410 Panic disorder [episodic paroxysmal anxiety] without agoraphobia: Secondary | ICD-10-CM | POA: Diagnosis not present

## 2015-07-21 DIAGNOSIS — F29 Unspecified psychosis not due to a substance or known physiological condition: Secondary | ICD-10-CM | POA: Diagnosis not present

## 2015-07-21 DIAGNOSIS — F419 Anxiety disorder, unspecified: Secondary | ICD-10-CM | POA: Diagnosis not present

## 2015-07-21 DIAGNOSIS — M6281 Muscle weakness (generalized): Secondary | ICD-10-CM | POA: Diagnosis not present

## 2015-07-21 DIAGNOSIS — F0390 Unspecified dementia without behavioral disturbance: Secondary | ICD-10-CM | POA: Diagnosis not present

## 2015-07-21 DIAGNOSIS — F339 Major depressive disorder, recurrent, unspecified: Secondary | ICD-10-CM | POA: Diagnosis not present

## 2015-07-21 DIAGNOSIS — R630 Anorexia: Secondary | ICD-10-CM | POA: Diagnosis not present

## 2015-07-21 DIAGNOSIS — M1990 Unspecified osteoarthritis, unspecified site: Secondary | ICD-10-CM | POA: Diagnosis not present

## 2015-07-26 ENCOUNTER — Ambulatory Visit (INDEPENDENT_AMBULATORY_CARE_PROVIDER_SITE_OTHER): Payer: Medicare Other | Admitting: Psychiatry

## 2015-07-26 VITALS — BP 136/84 | HR 80 | Ht 64.0 in | Wt 139.4 lb

## 2015-07-26 DIAGNOSIS — G3184 Mild cognitive impairment, so stated: Secondary | ICD-10-CM | POA: Diagnosis not present

## 2015-07-26 DIAGNOSIS — F4310 Post-traumatic stress disorder, unspecified: Secondary | ICD-10-CM

## 2015-07-26 DIAGNOSIS — F330 Major depressive disorder, recurrent, mild: Secondary | ICD-10-CM

## 2015-07-26 DIAGNOSIS — F331 Major depressive disorder, recurrent, moderate: Secondary | ICD-10-CM

## 2015-07-26 MED ORDER — CLONAZEPAM 0.5 MG PO TABS: 0.5000 mg | ORAL_TABLET | Freq: Every day | ORAL | Status: DC

## 2015-07-26 MED ORDER — MIRTAZAPINE 15 MG PO TABS: 15.0000 mg | ORAL_TABLET | Freq: Every day | ORAL | Status: DC

## 2015-07-26 MED ORDER — CITALOPRAM HYDROBROMIDE 20 MG PO TABS: 20.0000 mg | ORAL_TABLET | Freq: Every day | ORAL | Status: DC

## 2015-07-26 MED ORDER — QUETIAPINE FUMARATE 400 MG PO TABS: 400.0000 mg | ORAL_TABLET | Freq: Every day | ORAL | Status: DC

## 2015-07-27 ENCOUNTER — Encounter (HOSPITAL_COMMUNITY): Payer: Self-pay | Admitting: Psychiatry

## 2015-07-27 NOTE — Progress Notes (Signed)
[facility] [phone] Progress Note   Kendra Simmons [id number] 64 y.o.  07/27/2015 1:13 PM  Chief Complaint:  I was admitted in the hospital for dehydration.  I'm feeling better.  History of Present Illness:  Kendra Simmons came for her follow-up appointment with her two sisters.  She was recently admitted on the medical floor due to dehydration and acute renal failure.  Her creatinine was high.  She was given hydration and now she is feeling better.  She was also seen by psychiatry and her Celexa was increased to 20 mg.  Patient reported much improvement in her mood and depression.  She is sleeping better.  Her sister also believe that she is doing best on her current medication.  She still have memory impairment and feeling freaked but she is getting better.  Her appetite is improved from the past.  She had lost a lot of weight but now she is gaining back.  Sr. believe that she is sleeping better and also patient believe that she is sleeping all night.  She still requires help in her ADLs.  She denies any agitation, anger, mood swing.  She is taking Seroquel, Remeron, Celexa, Klonopin.  She has no tremors or shakes.  She has no more episodes of dizziness.  She's also taking Megace to increase appetite.  Though she improved from the past but she still require 24 7 help.  She is living at her place but her sisters are making shifts to provide ADLs.  Patient denies any nightmares or flashback.  She still have memory impairment and she has not seen a neurologist but promised to see neurologist very soon.  She still forgetful.  She denies any paranoia or any hallucination.  Patient denies drinking or using any illegal substances.  Suicidal Ideation: No Plan Formed: No Patient has means to carry out plan: No  Homicidal Ideation: No Plan Formed: No Patient has means to carry out plan: No  Past Psychiatric History/Hospitalization(s) Patient has multiple hospitalization. Her last hospitalization  was June 2016 .  She has done one intensive outpatient program.  She has a history of hallucination, paranoia, severe depression. In the past she has seen [name], [name] and [name].  As per chart she has taken in the past doxepin, Xanax, Seroquel, amitriptyline, imipramine, temazepam , trazodone , Zyprexa, Bellsomra, Luvox, Wellbutrin, Prozac and Vistaril.     Anxiety: Yes Bipolar Disorder: No Depression: Yes Mania: No Psychosis: History of hallucinations Schizophrenia: No Personality Disorder: No Hospitalization for psychiatric illness: Yes History of Electroconvulsive Shock Therapy: No Prior Suicide Attempts: No  Medical History; Patient has high cholesterol, memory impairment, GERD and palpitation.  She has history of frequent falls and seen by a neurologist in the past.  Her primary care physician is [name] in [place].  Review of Systems  Constitutional: Positive for weight loss.  Eyes: Negative for blurred vision.  Respiratory: Negative.   Cardiovascular: Negative for chest pain and palpitations.  Musculoskeletal: Negative.   Neurological: Negative for tremors and headaches.  Psychiatric/Behavioral: Positive for memory loss.   Psychiatric: Agitation: No Hallucination: No Depressed Mood: No Insomnia: No Hypersomnia: No Altered Concentration: No Feels Worthless: No Grandiose Ideas: No Belief In Special Powers: No New/Increased Substance Abuse: No Compulsions: No  Neurologic: Headache: No Seizure: No Paresthesias: Yes   Musculoskeletal: Strength & Muscle Tone: within normal limits Gait & Station: normal Patient leans: N/A   Outpatient Encounter Prescriptions as of 07/26/2015  Medication Sig  .  acetaminophen (TYLENOL) 500 MG tablet Take 1,000 mg by mouth every 6 (six) hours as needed for mild pain or headache.  . citalopram (CELEXA) 20 MG tablet Take 1 tablet (20 mg total) by mouth daily.  . clonazePAM (KLONOPIN) 0.5 MG tablet Take 1  tablet (0.5 mg total) by mouth at bedtime.  . megestrol (MEGACE) 400 MG/10ML suspension Take 10 mLs (400 mg total) by mouth daily.  . memantine (NAMENDA) 10 MG tablet Take 1 tablet (10 mg total) by mouth 2 (two) times daily.  . mirtazapine (REMERON) 15 MG tablet Take 1 tablet (15 mg total) by mouth at bedtime.  [name] omeprazole (PRILOSEC) 20 MG capsule Take 1 capsule (20 mg total) by mouth daily. For acid reflux  . pravastatin (PRAVACHOL) 20 MG tablet Take 1 tablet (20 mg total) by mouth at bedtime. For high cholesterol  . QUEtiapine (SEROQUEL) 400 MG tablet Take 1 tablet (400 mg total) by mouth at bedtime.  . tamsulosin (FLOMAX) 0.4 MG CAPS capsule Take 1 capsule (0.4 mg total) by mouth daily after supper. For frequent urination/urgency  . [DISCONTINUED] cephALEXin (KEFLEX) 500 MG capsule Take 1 capsule (500 mg total) by mouth 2 (two) times daily.  . [DISCONTINUED] citalopram (CELEXA) 20 MG tablet Take 1 tablet (20 mg total) by mouth daily.  . [DISCONTINUED] clonazePAM (KLONOPIN) 0.5 MG tablet Take 1 tablet (0.5 mg total) by mouth at bedtime.  . [DISCONTINUED] cyanocobalamin (,VITAMIN B-12,) 1000 MCG/ML injection Inject 1 mL (1,000 mcg total) into the skin once a week. For bone health (Patient not taking: Reported on 06/06/2015)  . [DISCONTINUED] loperamide (IMODIUM A-D) 2 MG tablet Take 2 mg by mouth 4 (four) times daily as needed for diarrhea or loose stools.  . [DISCONTINUED] mirtazapine (REMERON) 15 MG tablet Take 1 tablet (15 mg total) by mouth at bedtime.  . [DISCONTINUED] QUEtiapine (SEROQUEL) 400 MG tablet Take 1 tablet (400 mg total) by mouth at bedtime.   No facility-administered encounter medications on file as of 07/26/2015.    Recent Results (from the past 2160 hour(s))  Acetaminophen level     Status: Abnormal   Collection Time: 05/23/15  3:08 PM  Result Value Ref Range   Acetaminophen (Tylenol), Serum <10 (L) 10 - 30 ug/mL    Comment:        THERAPEUTIC CONCENTRATIONS  VARY SIGNIFICANTLY. A RANGE OF 10-30 ug/mL MAY BE AN EFFECTIVE CONCENTRATION FOR MANY PATIENTS. HOWEVER, SOME ARE BEST TREATED AT CONCENTRATIONS OUTSIDE THIS RANGE. ACETAMINOPHEN CONCENTRATIONS >150 ug/mL AT 4 HOURS AFTER INGESTION AND >50 ug/mL AT 12 HOURS AFTER INGESTION ARE OFTEN ASSOCIATED WITH TOXIC REACTIONS.   CBC     Status: None   Collection Time: 05/23/15  3:08 PM  Result Value Ref Range   WBC 7.7 4.0 - 10.5 K/uL   RBC 4.47 3.87 - 5.11 MIL/uL   Hemoglobin 14.0 12.0 - 15.0 g/dL   HCT 41.0 36.0 - 46.0 %   MCV 91.7 78.0 - 100.0 fL   MCH 31.3 26.0 - 34.0 pg   MCHC 34.1 30.0 - 36.0 g/dL   RDW 14.4 11.5 - 15.5 %   Platelets 185 150 - 400 K/uL  Comprehensive metabolic panel     Status: Abnormal   Collection Time: 05/23/15  3:08 PM  Result Value Ref Range   Sodium 143 135 - 145 mmol/L   Potassium 2.8 (L) 3.5 - 5.1 mmol/L   Chloride 108 101 - 111 mmol/L   CO2 22 22 - 32 mmol/L   Glucose,  Bld 140 (H) 65 - 99 mg/dL   BUN 13 6 - 20 mg/dL   Creatinine, Ser 0.84 0.44 - 1.00 mg/dL   Calcium 9.8 8.9 - 10.3 mg/dL   Total Protein 7.5 6.5 - 8.1 g/dL   Albumin 4.5 3.5 - 5.0 g/dL   AST 25 15 - 41 U/L   ALT 12 (L) 14 - 54 U/L   Alkaline Phosphatase 43 38 - 126 U/L   Total Bilirubin 0.6 0.3 - 1.2 mg/dL   GFR calc non Af Amer >60 >60 mL/min   GFR calc Af Amer >60 >60 mL/min    Comment: (NOTE) The eGFR has been calculated using the CKD EPI equation. This calculation has not been validated in all clinical situations. eGFR's persistently <60 mL/min signify possible Chronic Kidney Disease.    Anion gap 13 5 - 15  Ethanol (ETOH)     Status: None   Collection Time: 05/23/15  3:08 PM  Result Value Ref Range   Alcohol, Ethyl (B) <5 <5 mg/dL    Comment:        LOWEST DETECTABLE LIMIT FOR SERUM ALCOHOL IS 11 mg/dL FOR MEDICAL PURPOSES ONLY   Salicylate level     Status: None   Collection Time: 05/23/15  3:08 PM  Result Value Ref Range   Salicylate Lvl <7.7 2.8 - 30.0 mg/dL   Urine rapid drug screen (hosp performed)not at [facility]     Status: None   Collection Time: 05/23/15  4:57 PM  Result Value Ref Range   Opiates NONE DETECTED NONE DETECTED   Cocaine NONE DETECTED NONE DETECTED   Benzodiazepines NONE DETECTED NONE DETECTED   Amphetamines NONE DETECTED NONE DETECTED   Tetrahydrocannabinol NONE DETECTED NONE DETECTED   Barbiturates NONE DETECTED NONE DETECTED    Comment:        DRUG SCREEN FOR MEDICAL PURPOSES ONLY.  IF CONFIRMATION IS NEEDED FOR ANY PURPOSE, NOTIFY LAB WITHIN 5 DAYS.        LOWEST DETECTABLE LIMITS FOR URINE DRUG SCREEN Drug Class       Cutoff (ng/mL) Amphetamine      1000 Barbiturate      200 Benzodiazepine   824 Tricyclics       235 Opiates          300 Cocaine          300 THC              50   Potassium     Status: Abnormal   Collection Time: 05/24/15  6:24 AM  Result Value Ref Range   Potassium 3.0 (L) 3.5 - 5.1 mmol/L  Potassium     Status: Abnormal   Collection Time: 05/24/15 10:38 AM  Result Value Ref Range   Potassium 3.3 (L) 3.5 - 5.1 mmol/L  Urinalysis, Routine w reflex microscopic (not at [facility])     Status: Abnormal   Collection Time: 05/24/15  2:23 PM  Result Value Ref Range   Color, Urine AMBER (A) YELLOW    Comment: BIOCHEMICALS MAY BE AFFECTED BY COLOR   APPearance CLEAR CLEAR   Specific Gravity, Urine 1.029 1.005 - 1.030   pH 5.5 5.0 - 8.0   Glucose, UA NEGATIVE NEGATIVE mg/dL   Hgb urine dipstick NEGATIVE NEGATIVE   Bilirubin Urine SMALL (A) NEGATIVE   Ketones, ur 15 (A) NEGATIVE mg/dL   Protein, ur NEGATIVE NEGATIVE mg/dL   Urobilinogen, UA 1.0 0.0 - 1.0 mg/dL   Nitrite NEGATIVE NEGATIVE   Leukocytes, UA  NEGATIVE NEGATIVE    Comment: MICROSCOPIC NOT DONE ON URINES WITH NEGATIVE PROTEIN, BLOOD, LEUKOCYTES, NITRITE, OR GLUCOSE <1000 mg/dL.  Magnesium     Status: None   Collection Time: 05/25/15  7:34 PM  Result Value Ref Range   Magnesium 2.1 1.7 - 2.4 mg/dL    Comment: Performed at [facility]  Prolactin     Status: Abnormal   Collection Time: 05/25/15  7:34 PM  Result Value Ref Range   Prolactin 50.3 (H) 4.8 - 23.3 ng/mL    Comment: (NOTE) Performed At: [facility] [place], [place] [id number] Lindon Romp MD HF:[id number] Performed at [place] metabolic panel     Status: Abnormal   Collection Time: 05/26/15  8:35 PM  Result Value Ref Range   Sodium 143 135 - 145 mmol/L   Potassium 3.5 3.5 - 5.1 mmol/L   Chloride 110 101 - 111 mmol/L   CO2 25 22 - 32 mmol/L   Glucose, Bld 127 (H) 65 - 99 mg/dL   BUN 21 (H) 6 - 20 mg/dL   Creatinine, Ser 0.63 0.44 - 1.00 mg/dL   Calcium 9.5 8.9 - 10.3 mg/dL   GFR calc non Af Amer >60 >60 mL/min   GFR calc Af Amer >60 >60 mL/min    Comment: (NOTE) The eGFR has been calculated using the CKD EPI equation. This calculation has not been validated in all clinical situations. eGFR's persistently <60 mL/min signify possible Chronic Kidney Disease.    Anion gap 8 5 - 15    Comment: Performed at [facility]     Status: Abnormal   Collection Time: 05/27/15 10:55 PM  Result Value Ref Range   Sodium 144 135 - 145 mmol/L   Potassium 2.9 (L) 3.5 - 5.1 mmol/L   Chloride 107 101 - 111 mmol/L   BUN 15 6 - 20 mg/dL   Creatinine, Ser 0.60 0.44 - 1.00 mg/dL   Glucose, Bld 123 (H) 65 - 99 mg/dL   Calcium, Ion 1.20 1.13 - 1.30 mmol/L   TCO2 18 0 - 100 mmol/L   Hemoglobin 11.6 (L) 12.0 - 15.0 g/dL   HCT 34.0 (L) 36.0 - 46.0 %  Potassium     Status: None   Collection Time: 05/29/15  6:25 AM  Result Value Ref Range   Potassium 3.7 3.5 - 5.1 mmol/L    Comment: Performed at [facility]  Magnesium     Status: None   Collection Time: 05/29/15  6:25 AM  Result Value Ref Range   Magnesium 1.8 1.7 - 2.4 mg/dL    Comment: Performed at [facility]  TSH     Status: None   Collection Time: 05/29/15  6:25  AM  Result Value Ref Range   TSH 2.704 0.350 - 4.500 uIU/mL    Comment: Performed at [facility]  CBC     Status: Abnormal   Collection Time: 06/01/15  5:00 AM  Result Value Ref Range   WBC 6.2 4.0 - 10.5 K/uL   RBC 3.51 (L) 3.87 - 5.11 MIL/uL   Hemoglobin 10.7 (L) 12.0 - 15.0 g/dL   HCT 32.6 (L) 36.0 - 46.0 %   MCV 92.9 78.0 - 100.0 fL   MCH 30.5 26.0 - 34.0 pg   MCHC 32.8 30.0 - 36.0 g/dL   RDW 14.4 11.5 - 15.5 %  Platelets 120 (L) 150 - 400 K/uL    Comment: Performed at [place] metabolic panel     Status: Abnormal   Collection Time: 06/01/15  7:05 AM  Result Value Ref Range   Sodium 140 135 - 145 mmol/L   Potassium 3.5 3.5 - 5.1 mmol/L   Chloride 109 101 - 111 mmol/L   CO2 24 22 - 32 mmol/L   Glucose, Bld 101 (H) 65 - 99 mg/dL   BUN 14 6 - 20 mg/dL   Creatinine, Ser 0.63 0.44 - 1.00 mg/dL   Calcium 9.2 8.9 - 10.3 mg/dL   GFR calc non Af Amer >60 >60 mL/min   GFR calc Af Amer >60 >60 mL/min    Comment: (NOTE) The eGFR has been calculated using the CKD EPI equation. This calculation has not been validated in all clinical situations. eGFR's persistently <60 mL/min signify possible Chronic Kidney Disease.    Anion gap 7 5 - 15    Comment: Performed at [facility]  CBC     Status: None   Collection Time: 06/06/15  2:58 PM  Result Value Ref Range   WBC 9.4 4.0 - 10.5 K/uL   RBC 4.50 3.87 - 5.11 MIL/uL   Hemoglobin 13.8 12.0 - 15.0 g/dL   HCT 42.4 36.0 - 46.0 %   MCV 94.2 78.0 - 100.0 fL   MCH 30.7 26.0 - 34.0 pg   MCHC 32.5 30.0 - 36.0 g/dL   RDW 13.9 11.5 - 15.5 %   Platelets 191 150 - 400 K/uL  Basic metabolic panel     Status: Abnormal   Collection Time: 06/06/15  2:58 PM  Result Value Ref Range   Sodium 139 135 - 145 mmol/L   Potassium 4.1 3.5 - 5.1 mmol/L   Chloride 107 101 - 111 mmol/L   CO2 24 22 - 32 mmol/L   Glucose, Bld 136 (H) 65 - 99 mg/dL   BUN 17 6 - 20 mg/dL   Creatinine, Ser  0.79 0.44 - 1.00 mg/dL   Calcium 9.7 8.9 - 10.3 mg/dL   GFR calc non Af Amer >60 >60 mL/min   GFR calc Af Amer >60 >60 mL/min    Comment: (NOTE) The eGFR has been calculated using the CKD EPI equation. This calculation has not been validated in all clinical situations. eGFR's persistently <60 mL/min signify possible Chronic Kidney Disease.    Anion gap 8 5 - 15  Urinalysis, Routine w reflex microscopic (not at [facility])     Status: Abnormal   Collection Time: 06/06/15  6:11 PM  Result Value Ref Range   Color, Urine YELLOW YELLOW   APPearance CLOUDY (A) CLEAR   Specific Gravity, Urine 1.015 1.005 - 1.030   pH 7.0 5.0 - 8.0   Glucose, UA NEGATIVE NEGATIVE mg/dL   Hgb urine dipstick NEGATIVE NEGATIVE   Bilirubin Urine NEGATIVE NEGATIVE   Ketones, ur NEGATIVE NEGATIVE mg/dL   Protein, ur NEGATIVE NEGATIVE mg/dL   Urobilinogen, UA 1.0 0.0 - 1.0 mg/dL   Nitrite NEGATIVE NEGATIVE   Leukocytes, UA SMALL (A) NEGATIVE  Urine microscopic-add on     Status: Abnormal   Collection Time: 06/06/15  6:11 PM  Result Value Ref Range   Squamous Epithelial / LPF FEW (A) RARE   WBC, UA 3-6 <3 WBC/hpf   Urine-Other MUCOUS PRESENT     Comment: AMORPHOUS URATES/PHOSPHATES  CBC with Differential/Platelet     Status: Abnormal   Collection  Time: 07/03/15  1:15 PM  Result Value Ref Range   WBC 5.0 4.0 - 10.5 K/uL   RBC 3.66 (L) 3.87 - 5.11 MIL/uL   Hemoglobin 11.4 (L) 12.0 - 15.0 g/dL   HCT 34.3 (L) 36.0 - 46.0 %   MCV 93.7 78.0 - 100.0 fL   MCH 31.1 26.0 - 34.0 pg   MCHC 33.2 30.0 - 36.0 g/dL   RDW 12.5 11.5 - 15.5 %   Platelets 125 (L) 150 - 400 K/uL   Neutrophils Relative % 58 43 - 77 %   Neutro Abs 2.9 1.7 - 7.7 K/uL   Lymphocytes Relative 31 12 - 46 %   Lymphs Abs 1.5 0.7 - 4.0 K/uL   Monocytes Relative 8 3 - 12 %   Monocytes Absolute 0.4 0.1 - 1.0 K/uL   Eosinophils Relative 3 0 - 5 %   Eosinophils Absolute 0.1 0.0 - 0.7 K/uL   Basophils Relative 0 0 - 1 %   Basophils Absolute 0.0 0.0 -  0.1 K/uL  Comprehensive metabolic panel     Status: Abnormal   Collection Time: 07/03/15  1:15 PM  Result Value Ref Range   Sodium 143 135 - 145 mmol/L   Potassium 3.3 (L) 3.5 - 5.1 mmol/L   Chloride 114 (H) 101 - 111 mmol/L   CO2 20 (L) 22 - 32 mmol/L   Glucose, Bld 92 65 - 99 mg/dL   BUN 19 6 - 20 mg/dL   Creatinine, Ser 1.41 (H) 0.44 - 1.00 mg/dL   Calcium 9.4 8.9 - 10.3 mg/dL   Total Protein 6.5 6.5 - 8.1 g/dL   Albumin 3.9 3.5 - 5.0 g/dL   AST 25 15 - 41 U/L   ALT 17 14 - 54 U/L   Alkaline Phosphatase 48 38 - 126 U/L   Total Bilirubin 0.5 0.3 - 1.2 mg/dL   GFR calc non Af Amer 38 (L) >60 mL/min   GFR calc Af Amer 45 (L) >60 mL/min    Comment: (NOTE) The eGFR has been calculated using the CKD EPI equation. This calculation has not been validated in all clinical situations. eGFR's persistently <60 mL/min signify possible Chronic Kidney Disease.    Anion gap 9 5 - 15  Troponin I     Status: None   Collection Time: 07/03/15  1:15 PM  Result Value Ref Range   Troponin I <0.03 <0.031 ng/mL    Comment:        NO INDICATION OF MYOCARDIAL INJURY.   I-Stat CG4 Lactic Acid, ED     Status: None   Collection Time: 07/03/15  1:26 PM  Result Value Ref Range   Lactic Acid, Venous 1.54 0.5 - 2.0 mmol/L  Urinalysis, Routine w reflex microscopic (not at [facility])     Status: Abnormal   Collection Time: 07/03/15  2:20 PM  Result Value Ref Range   Color, Urine AMBER (A) YELLOW    Comment: BIOCHEMICALS MAY BE AFFECTED BY COLOR   APPearance CLOUDY (A) CLEAR   Specific Gravity, Urine 1.027 1.005 - 1.030   pH 6.0 5.0 - 8.0   Glucose, UA NEGATIVE NEGATIVE mg/dL   Hgb urine dipstick SMALL (A) NEGATIVE   Bilirubin Urine SMALL (A) NEGATIVE   Ketones, ur NEGATIVE NEGATIVE mg/dL   Protein, ur 30 (A) NEGATIVE mg/dL   Urobilinogen, UA 0.2 0.0 - 1.0 mg/dL   Nitrite NEGATIVE NEGATIVE   Leukocytes, UA LARGE (A) NEGATIVE  Urine microscopic-add on  Status: Abnormal   Collection Time: 07/03/15   2:20 PM  Result Value Ref Range   Squamous Epithelial / LPF MANY (A) RARE   WBC, UA TOO NUMEROUS TO COUNT <3 WBC/hpf   Bacteria, UA MANY (A) RARE  TSH     Status: None   Collection Time: 07/04/15  4:28 AM  Result Value Ref Range   TSH 1.523 0.350 - 4.500 uIU/mL  CBC     Status: Abnormal   Collection Time: 07/04/15  4:28 AM  Result Value Ref Range   WBC 3.8 (L) 4.0 - 10.5 K/uL   RBC 3.25 (L) 3.87 - 5.11 MIL/uL   Hemoglobin 10.1 (L) 12.0 - 15.0 g/dL   HCT 30.2 (L) 36.0 - 46.0 %   MCV 92.9 78.0 - 100.0 fL   MCH 31.1 26.0 - 34.0 pg   MCHC 33.4 30.0 - 36.0 g/dL   RDW 12.6 11.5 - 15.5 %   Platelets 108 (L) 150 - 400 K/uL    Comment: RESULT REPEATED AND VERIFIED SPECIMEN CHECKED FOR CLOTS PLATELET COUNT CONFIRMED BY SMEAR   Comprehensive metabolic panel     Status: Abnormal   Collection Time: 07/04/15  4:28 AM  Result Value Ref Range   Sodium 142 135 - 145 mmol/L   Potassium 3.0 (L) 3.5 - 5.1 mmol/L   Chloride 116 (H) 101 - 111 mmol/L   CO2 20 (L) 22 - 32 mmol/L   Glucose, Bld 109 (H) 65 - 99 mg/dL   BUN 10 6 - 20 mg/dL   Creatinine, Ser 0.70 0.44 - 1.00 mg/dL   Calcium 8.5 (L) 8.9 - 10.3 mg/dL   Total Protein 5.5 (L) 6.5 - 8.1 g/dL   Albumin 3.4 (L) 3.5 - 5.0 g/dL   AST 21 15 - 41 U/L   ALT 16 14 - 54 U/L   Alkaline Phosphatase 42 38 - 126 U/L   Total Bilirubin 0.7 0.3 - 1.2 mg/dL   GFR calc non Af Amer >60 >60 mL/min   GFR calc Af Amer >60 >60 mL/min    Comment: (NOTE) The eGFR has been calculated using the CKD EPI equation. This calculation has not been validated in all clinical situations. eGFR's persistently <60 mL/min signify possible Chronic Kidney Disease.    Anion gap 6 5 - 15  Magnesium     Status: None   Collection Time: 07/05/15  4:28 AM  Result Value Ref Range   Magnesium 1.8 1.7 - 2.4 mg/dL  Basic metabolic panel     Status: Abnormal   Collection Time: 07/05/15  4:28 AM  Result Value Ref Range   Sodium 141 135 - 145 mmol/L   Potassium 3.6 3.5 - 5.1  mmol/L   Chloride 116 (H) 101 - 111 mmol/L   CO2 20 (L) 22 - 32 mmol/L   Glucose, Bld 93 65 - 99 mg/dL   BUN 8 6 - 20 mg/dL   Creatinine, Ser 0.75 0.44 - 1.00 mg/dL   Calcium 8.5 (L) 8.9 - 10.3 mg/dL   GFR calc non Af Amer >60 >60 mL/min   GFR calc Af Amer >60 >60 mL/min    Comment: (NOTE) The eGFR has been calculated using the CKD EPI equation. This calculation has not been validated in all clinical situations. eGFR's persistently <60 mL/min signify possible Chronic Kidney Disease.    Anion gap 5 5 - 15  Basic metabolic panel     Status: Abnormal   Collection Time: 07/06/15  4:57 AM  Result  Value Ref Range   Sodium 142 135 - 145 mmol/L   Potassium 3.6 3.5 - 5.1 mmol/L   Chloride 112 (H) 101 - 111 mmol/L   CO2 22 22 - 32 mmol/L   Glucose, Bld 88 65 - 99 mg/dL   BUN 10 6 - 20 mg/dL   Creatinine, Ser 0.73 0.44 - 1.00 mg/dL   Calcium 8.8 (L) 8.9 - 10.3 mg/dL   GFR calc non Af Amer >60 >60 mL/min   GFR calc Af Amer >60 >60 mL/min    Comment: (NOTE) The eGFR has been calculated using the CKD EPI equation. This calculation has not been validated in all clinical situations. eGFR's persistently <60 mL/min signify possible Chronic Kidney Disease.    Anion gap 8 5 - 15      Constitutional:  BP 136/84 mmHg  Pulse 80  Ht _0  (1.626 m)  Wt 139 lb 6.4 oz (63.231 kg)  BMI 23.92 kg/m2   Mental Status Examination;  Patient is casually dressed and fairly groomed. She appears tired but cooperative and maintained fair eye contact.  Her speech is slow, clear and coherent.  Her thought process slow and logical.  She described her mood euthymic and her affect is improved from the past.  Her psychomotor activity is slightly decreased.  She denies any active or passive suicidal thoughts or homicidal thought.  There were no delusions, paranoia or any obsessive thoughts.  Her attention and concentration is fair.  Her fund of knowledge is average.  She has difficulty remembering things.  She  has no tremors or shakes.  She is alert and oriented 3.  Her insight, judgment and impulse control is fair.   Established Problem, Stable/Improving (1), Review of Psycho-Social Stressors (1), Review or order clinical lab tests (1), Review and summation of old records (2), Review of Last Therapy Session (1) and Review of Medication Regimen & Side Effects (2)  Assessment: Axis I: Maj. depressive disorder, recurrent severe, posttraumatic stress disorder, cognitive disorder NOS  Axis II: Deferred  Axis III:  Past Medical History  Diagnosis Date  . High cholesterol   . Depression   . Anxiety   . GERD (gastroesophageal reflux disease)   . Insomnia   . Arthritis   . Memory loss   . Emotional depression 02/04/2015   Plan:  I reviewed her discharge summary, current medication, recent blood work and notes from her recent hospitalization.  Her creatinine is improved from the past.  Her Celexa was increased while she was in Hospital by consultation liaison services.  She is feeling better.  She has no side effects.  I will continue Klonopin 0.5 mg at bedtime, Seroquel 400 mg at bedtime, Celexa 20 mg daily.  She has no tremors or shakes.  Encourage to see neurologist for memory impairment.  Due to hospital stay she is unable to see Joaquim Lai but she like to see a therapist for coping skills.  We will schedule appointment with Joaquim Lai in this office.  Plan discussed with the patient and with her 2 sister.  Recommended to call us back if there is any question concern or if patient feels worsening of the symptoms.  Time spent 25 minutes.  More than 50% of the time spent in psychoeducation, counseling" of care. Discuss safety plan that anytime having active suicidal thoughts or homicidal thoughts then patient need to call 911 or go to the local emergency room.   ARFEEN,SYED T., MD 07/27/2015

## 2015-08-02 DIAGNOSIS — R262 Difficulty in walking, not elsewhere classified: Secondary | ICD-10-CM | POA: Diagnosis not present

## 2015-08-02 DIAGNOSIS — M1990 Unspecified osteoarthritis, unspecified site: Secondary | ICD-10-CM | POA: Diagnosis not present

## 2015-08-02 DIAGNOSIS — M6281 Muscle weakness (generalized): Secondary | ICD-10-CM | POA: Diagnosis not present

## 2015-08-02 DIAGNOSIS — R296 Repeated falls: Secondary | ICD-10-CM | POA: Diagnosis not present

## 2015-08-05 ENCOUNTER — Other Ambulatory Visit (HOSPITAL_COMMUNITY): Payer: Self-pay | Admitting: Psychiatry

## 2015-08-08 DIAGNOSIS — M1990 Unspecified osteoarthritis, unspecified site: Secondary | ICD-10-CM | POA: Diagnosis not present

## 2015-08-08 DIAGNOSIS — R296 Repeated falls: Secondary | ICD-10-CM | POA: Diagnosis not present

## 2015-08-08 DIAGNOSIS — M6281 Muscle weakness (generalized): Secondary | ICD-10-CM | POA: Diagnosis not present

## 2015-08-08 DIAGNOSIS — R262 Difficulty in walking, not elsewhere classified: Secondary | ICD-10-CM | POA: Diagnosis not present

## 2015-08-11 DIAGNOSIS — M1990 Unspecified osteoarthritis, unspecified site: Secondary | ICD-10-CM | POA: Diagnosis not present

## 2015-08-11 DIAGNOSIS — R296 Repeated falls: Secondary | ICD-10-CM | POA: Diagnosis not present

## 2015-08-11 DIAGNOSIS — R262 Difficulty in walking, not elsewhere classified: Secondary | ICD-10-CM | POA: Diagnosis not present

## 2015-08-11 DIAGNOSIS — M6281 Muscle weakness (generalized): Secondary | ICD-10-CM | POA: Diagnosis not present

## 2015-08-16 DIAGNOSIS — M1990 Unspecified osteoarthritis, unspecified site: Secondary | ICD-10-CM | POA: Diagnosis not present

## 2015-08-16 DIAGNOSIS — R296 Repeated falls: Secondary | ICD-10-CM | POA: Diagnosis not present

## 2015-08-16 DIAGNOSIS — R262 Difficulty in walking, not elsewhere classified: Secondary | ICD-10-CM | POA: Diagnosis not present

## 2015-08-16 DIAGNOSIS — M6281 Muscle weakness (generalized): Secondary | ICD-10-CM | POA: Diagnosis not present

## 2015-08-18 ENCOUNTER — Other Ambulatory Visit: Payer: Self-pay | Admitting: Endocrinology

## 2015-08-18 DIAGNOSIS — R262 Difficulty in walking, not elsewhere classified: Secondary | ICD-10-CM | POA: Diagnosis not present

## 2015-08-18 DIAGNOSIS — M1990 Unspecified osteoarthritis, unspecified site: Secondary | ICD-10-CM | POA: Diagnosis not present

## 2015-08-18 DIAGNOSIS — M6281 Muscle weakness (generalized): Secondary | ICD-10-CM | POA: Diagnosis not present

## 2015-08-18 DIAGNOSIS — R296 Repeated falls: Secondary | ICD-10-CM | POA: Diagnosis not present

## 2015-08-23 DIAGNOSIS — R262 Difficulty in walking, not elsewhere classified: Secondary | ICD-10-CM | POA: Diagnosis not present

## 2015-08-23 DIAGNOSIS — M6281 Muscle weakness (generalized): Secondary | ICD-10-CM | POA: Diagnosis not present

## 2015-08-23 DIAGNOSIS — M1990 Unspecified osteoarthritis, unspecified site: Secondary | ICD-10-CM | POA: Diagnosis not present

## 2015-08-23 DIAGNOSIS — R296 Repeated falls: Secondary | ICD-10-CM | POA: Diagnosis not present

## 2015-08-25 DIAGNOSIS — M1990 Unspecified osteoarthritis, unspecified site: Secondary | ICD-10-CM | POA: Diagnosis not present

## 2015-08-25 DIAGNOSIS — M6281 Muscle weakness (generalized): Secondary | ICD-10-CM | POA: Diagnosis not present

## 2015-08-25 DIAGNOSIS — R262 Difficulty in walking, not elsewhere classified: Secondary | ICD-10-CM | POA: Diagnosis not present

## 2015-08-25 DIAGNOSIS — R296 Repeated falls: Secondary | ICD-10-CM | POA: Diagnosis not present

## 2015-08-30 DIAGNOSIS — M1990 Unspecified osteoarthritis, unspecified site: Secondary | ICD-10-CM | POA: Diagnosis not present

## 2015-08-30 DIAGNOSIS — M6281 Muscle weakness (generalized): Secondary | ICD-10-CM | POA: Diagnosis not present

## 2015-08-30 DIAGNOSIS — R262 Difficulty in walking, not elsewhere classified: Secondary | ICD-10-CM | POA: Diagnosis not present

## 2015-08-30 DIAGNOSIS — R296 Repeated falls: Secondary | ICD-10-CM | POA: Diagnosis not present

## 2015-09-01 DIAGNOSIS — M1990 Unspecified osteoarthritis, unspecified site: Secondary | ICD-10-CM | POA: Diagnosis not present

## 2015-09-01 DIAGNOSIS — R262 Difficulty in walking, not elsewhere classified: Secondary | ICD-10-CM | POA: Diagnosis not present

## 2015-09-01 DIAGNOSIS — M6281 Muscle weakness (generalized): Secondary | ICD-10-CM | POA: Diagnosis not present

## 2015-09-01 DIAGNOSIS — R296 Repeated falls: Secondary | ICD-10-CM | POA: Diagnosis not present

## 2015-09-12 ENCOUNTER — Ambulatory Visit (INDEPENDENT_AMBULATORY_CARE_PROVIDER_SITE_OTHER): Payer: Medicare Other | Admitting: Clinical

## 2015-09-12 ENCOUNTER — Telehealth (HOSPITAL_COMMUNITY): Payer: Self-pay

## 2015-09-12 ENCOUNTER — Other Ambulatory Visit (HOSPITAL_COMMUNITY): Payer: Self-pay | Admitting: Psychiatry

## 2015-09-12 DIAGNOSIS — F411 Generalized anxiety disorder: Secondary | ICD-10-CM

## 2015-09-12 DIAGNOSIS — F410 Panic disorder [episodic paroxysmal anxiety] without agoraphobia: Secondary | ICD-10-CM | POA: Diagnosis not present

## 2015-09-12 DIAGNOSIS — F332 Major depressive disorder, recurrent severe without psychotic features: Secondary | ICD-10-CM

## 2015-09-12 DIAGNOSIS — F331 Major depressive disorder, recurrent, moderate: Secondary | ICD-10-CM

## 2015-09-12 MED ORDER — CLONAZEPAM 0.5 MG PO TABS: 0.5000 mg | ORAL_TABLET | Freq: Every day | ORAL | Status: DC

## 2015-09-12 NOTE — Telephone Encounter (Signed)
Medication refill request for Clonazepam - Pt in for therapy today with sister stating they need a new Clonazepam order as she reports thinking they gave to pharmacy but Bryson Ha, pharmacist at [facility] reports no order from 07/26/15.  Pharmacist does report the last order filled was from an [place], named [facility] for 30 days supply on 07/21/15 and had never filled another Clonazepam order from this provider or other's prior to this dosage.  Patient and her sister request a new order.
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
## 2015-09-13 DIAGNOSIS — M1990 Unspecified osteoarthritis, unspecified site: Secondary | ICD-10-CM | POA: Diagnosis not present

## 2015-09-13 DIAGNOSIS — R296 Repeated falls: Secondary | ICD-10-CM | POA: Diagnosis not present

## 2015-09-13 DIAGNOSIS — M6281 Muscle weakness (generalized): Secondary | ICD-10-CM | POA: Diagnosis not present

## 2015-09-13 DIAGNOSIS — R262 Difficulty in walking, not elsewhere classified: Secondary | ICD-10-CM | POA: Diagnosis not present

## 2015-09-15 DIAGNOSIS — R296 Repeated falls: Secondary | ICD-10-CM | POA: Diagnosis not present

## 2015-09-15 DIAGNOSIS — R262 Difficulty in walking, not elsewhere classified: Secondary | ICD-10-CM | POA: Diagnosis not present

## 2015-09-15 DIAGNOSIS — M1990 Unspecified osteoarthritis, unspecified site: Secondary | ICD-10-CM | POA: Diagnosis not present

## 2015-09-15 DIAGNOSIS — M6281 Muscle weakness (generalized): Secondary | ICD-10-CM | POA: Diagnosis not present

## 2015-09-18 ENCOUNTER — Encounter (HOSPITAL_COMMUNITY): Payer: Self-pay | Admitting: Clinical

## 2015-09-20 DIAGNOSIS — R296 Repeated falls: Secondary | ICD-10-CM | POA: Diagnosis not present

## 2015-09-20 DIAGNOSIS — R262 Difficulty in walking, not elsewhere classified: Secondary | ICD-10-CM | POA: Diagnosis not present

## 2015-09-20 DIAGNOSIS — M1990 Unspecified osteoarthritis, unspecified site: Secondary | ICD-10-CM | POA: Diagnosis not present

## 2015-09-20 DIAGNOSIS — M6281 Muscle weakness (generalized): Secondary | ICD-10-CM | POA: Diagnosis not present

## 2015-09-21 DIAGNOSIS — Z23 Encounter for immunization: Secondary | ICD-10-CM | POA: Diagnosis not present

## 2015-09-22 DIAGNOSIS — M6281 Muscle weakness (generalized): Secondary | ICD-10-CM | POA: Diagnosis not present

## 2015-09-22 DIAGNOSIS — M1990 Unspecified osteoarthritis, unspecified site: Secondary | ICD-10-CM | POA: Diagnosis not present

## 2015-09-22 DIAGNOSIS — R296 Repeated falls: Secondary | ICD-10-CM | POA: Diagnosis not present

## 2015-09-22 DIAGNOSIS — R262 Difficulty in walking, not elsewhere classified: Secondary | ICD-10-CM | POA: Diagnosis not present

## 2015-09-27 DIAGNOSIS — M6281 Muscle weakness (generalized): Secondary | ICD-10-CM | POA: Diagnosis not present

## 2015-09-27 DIAGNOSIS — M1990 Unspecified osteoarthritis, unspecified site: Secondary | ICD-10-CM | POA: Diagnosis not present

## 2015-09-27 DIAGNOSIS — R296 Repeated falls: Secondary | ICD-10-CM | POA: Diagnosis not present

## 2015-09-27 DIAGNOSIS — R262 Difficulty in walking, not elsewhere classified: Secondary | ICD-10-CM | POA: Diagnosis not present

## 2015-09-29 DIAGNOSIS — R296 Repeated falls: Secondary | ICD-10-CM | POA: Diagnosis not present

## 2015-09-29 DIAGNOSIS — R262 Difficulty in walking, not elsewhere classified: Secondary | ICD-10-CM | POA: Diagnosis not present

## 2015-09-29 DIAGNOSIS — M6281 Muscle weakness (generalized): Secondary | ICD-10-CM | POA: Diagnosis not present

## 2015-09-29 DIAGNOSIS — M1990 Unspecified osteoarthritis, unspecified site: Secondary | ICD-10-CM | POA: Diagnosis not present

## 2015-10-26 ENCOUNTER — Encounter (HOSPITAL_COMMUNITY): Payer: Self-pay | Admitting: Psychiatry

## 2015-10-26 ENCOUNTER — Ambulatory Visit (INDEPENDENT_AMBULATORY_CARE_PROVIDER_SITE_OTHER): Payer: Medicare Other | Admitting: Psychiatry

## 2015-10-26 VITALS — BP 105/74 | HR 98 | Ht 63.0 in | Wt 158.0 lb

## 2015-10-26 DIAGNOSIS — F331 Major depressive disorder, recurrent, moderate: Secondary | ICD-10-CM | POA: Diagnosis not present

## 2015-10-26 DIAGNOSIS — F4310 Post-traumatic stress disorder, unspecified: Secondary | ICD-10-CM

## 2015-10-26 DIAGNOSIS — F330 Major depressive disorder, recurrent, mild: Secondary | ICD-10-CM

## 2015-10-26 MED ORDER — QUETIAPINE FUMARATE 400 MG PO TABS: 400.0000 mg | ORAL_TABLET | Freq: Every day | ORAL | Status: DC

## 2015-10-26 MED ORDER — CLONAZEPAM 0.5 MG PO TABS: 0.5000 mg | ORAL_TABLET | Freq: Every day | ORAL | Status: DC

## 2015-10-26 MED ORDER — MIRTAZAPINE 15 MG PO TABS: 15.0000 mg | ORAL_TABLET | Freq: Every day | ORAL | Status: DC

## 2015-10-26 MED ORDER — CITALOPRAM HYDROBROMIDE 20 MG PO TABS: 20.0000 mg | ORAL_TABLET | Freq: Every day | ORAL | Status: DC

## 2015-10-26 NOTE — Progress Notes (Signed)
[facility] Progress Note   Kendra Simmons [id number] 64 y.o.  10/26/2015 1:59 PM  Chief Complaint:  I am doing better but I have sleep issue.    History of Present Illness:  Kendra Simmons came for her follow-up appointment with her two sisters.  She was seen 3 months ago and at that time she was recently admitted on the medical floor due to dehydration .  She is feeling much better.  Her sister endorse much improvement in her appetite and physical energy.  However patient is to endorse some time episodes of insomnia and racing thoughts.  She also endorse numbness in her feet.  She has seen Dr. Launa Simmons in the past.  Patient denies any dizziness, fall or any tremors.  She still have some time memory issues but overall she has seen a huge improvement in her mood, socialization and energy level.  Her sister also believe improvement in her appetite and vitals.  Patient denies any nightmares or flashbacks.  She denies any crying spells.  She denies any paranoia or any hallucination.  She denies any agitation, anger or any mania.  She denies any suicidal thoughts.  She is hoping to visit her daughter who has diagnoses of autism and she lives in group home.  She has not seen her daughter in one year.  Patient has 24 7 help from her sister.  She is living at her place but her sisters are making shifts to provide ADLs.  Patient denies drinking or using any illegal substances.  She takes Seroquel, Celexa, Klonopin and Remeron.  She has no side effects including any EPS.  Suicidal Ideation: No Plan Formed: No Patient has means to carry out plan: No  Homicidal Ideation: No Plan Formed: No Patient has means to carry out plan: No  Past Psychiatric History/Hospitalization(s) Patient has multiple hospitalization. Her last hospitalization was June 2016 .  She has done one intensive outpatient program.  She has a history of hallucination, paranoia, severe depression. In the past she has seen Kendra Simmons, Dr Kendra Simmons and Dr Kendra Simmons.  As per chart she has taken in the past doxepin, Xanax, Seroquel, amitriptyline, imipramine, temazepam , trazodone , Zyprexa, Bellsomra, Luvox, Wellbutrin, Prozac and Vistaril.     Anxiety: Yes Bipolar Disorder: No Depression: Yes Mania: No Psychosis: History of hallucinations Schizophrenia: No Personality Disorder: No Hospitalization for psychiatric illness: Yes History of Electroconvulsive Shock Therapy: No Prior Suicide Attempts: No  Medical History; Patient has high cholesterol, memory impairment, GERD and palpitation.  She has history of frequent falls and seen by a neurologist in the past.  Her primary care physician is Dr. Rex Simmons in [place].  Review of Systems  Constitutional: Negative for weight loss.  Eyes: Negative for blurred vision.  Respiratory: Negative.   Cardiovascular: Negative for chest pain and palpitations.  Musculoskeletal: Negative.   Skin: Negative for itching and rash.  Neurological: Negative for tremors and headaches.       Numbness in her feet  Psychiatric/Behavioral: Positive for memory loss.   Psychiatric: Agitation: No Hallucination: No Depressed Mood: No Insomnia: No Hypersomnia: No Altered Concentration: No Feels Worthless: No Grandiose Ideas: No Belief In Special Powers: No New/Increased Substance Abuse: No Compulsions: No  Neurologic: Headache: No Seizure: No Paresthesias: Yes   Musculoskeletal: Strength & Muscle Tone: within normal limits Gait & Station: normal Patient leans: N/A   Outpatient Encounter Prescriptions as of 10/26/2015  Medication Sig  . acetaminophen (TYLENOL) 500 MG tablet Take 1,000  mg by mouth every 6 (six) hours as needed for mild pain or headache.  . citalopram (CELEXA) 20 MG tablet Take 1 tablet (20 mg total) by mouth daily.  . clonazePAM (KLONOPIN) 0.5 MG tablet Take 1 tablet (0.5 mg total) by mouth at bedtime.  . megestrol (MEGACE) 400 MG/10ML suspension Take  10 mLs (400 mg total) by mouth daily.  . memantine (NAMENDA) 10 MG tablet Take 1 tablet (10 mg total) by mouth 2 (two) times daily.  . mirtazapine (REMERON) 15 MG tablet Take 1 tablet (15 mg total) by mouth at bedtime.  [name] omeprazole (PRILOSEC) 20 MG capsule Take 1 capsule (20 mg total) by mouth daily. For acid reflux  . pravastatin (PRAVACHOL) 20 MG tablet Take 1 tablet (20 mg total) by mouth at bedtime. For high cholesterol  . QUEtiapine (SEROQUEL) 400 MG tablet Take 1 tablet (400 mg total) by mouth at bedtime.  . tamsulosin (FLOMAX) 0.4 MG CAPS capsule Take 1 capsule (0.4 mg total) by mouth daily after supper. For frequent urination/urgency  . [DISCONTINUED] citalopram (CELEXA) 20 MG tablet Take 1 tablet (20 mg total) by mouth daily.  . [DISCONTINUED] clonazePAM (KLONOPIN) 0.5 MG tablet Take 1 tablet (0.5 mg total) by mouth at bedtime.  . [DISCONTINUED] mirtazapine (REMERON) 15 MG tablet Take 1 tablet (15 mg total) by mouth at bedtime.  . [DISCONTINUED] QUEtiapine (SEROQUEL) 400 MG tablet Take 1 tablet (400 mg total) by mouth at bedtime.   No facility-administered encounter medications on file as of 10/26/2015.    No results found for this or any previous visit (from the past 2160 hour(s)).    Constitutional:  BP 105/74 mmHg  Pulse 98  Ht 5\' 3"  (1.6 m)  Wt 158 lb (71.668 kg)  BMI 28.00 kg/m2   Mental Status Examination;  Patient is casually dressed and fairly groomed.  She is pleasant and cooperative.  She maintained good eye contact.  Her speech is slow, clear and coherent.  Her thought process slow and logical.  She described her mood euthymic and her affect is improved from the past.  Her psychomotor activity is normal.  She denies any active or passive suicidal thoughts or homicidal thought.  There were no delusions, paranoia or any obsessive thoughts.  Her attention and concentration is fair.  Her fund of knowledge is average.  She has difficulty remembering things.  She has no  tremors or shakes.  She is alert and oriented 3.  Her insight, judgment and impulse control is fair.   Established Problem, Stable/Improving (1), New problem, with additional work up planned, Review of Psycho-Social Stressors (1), Review and summation of old records (2), Review of Last Therapy Session (1) and Review of Medication Regimen & Side Effects (2)  Assessment: Axis I: Maj. depressive disorder, recurrent severe, posttraumatic stress disorder, cognitive disorder NOS  Axis II: Deferred  Axis III:  Past Medical History  Diagnosis Date  . High cholesterol   . Depression   . Anxiety   . GERD (gastroesophageal reflux disease)   . Insomnia   . Arthritis   . Memory loss   . Emotional depression 02/04/2015   Plan:  I reviewed her symptoms, current medication and collateral information.  In the past she had tried numerous psychiatric medication to help sleep but limited response.  I recommended to try melatonin 6 mg to help the insomnia and we had a long discussion with the patient and her sisters about medication side effects.  She had a history of  fall and dizziness.  She had a sleep study which was unremarkable.  I recommended to see her neurologist and to discuss her feet numbness and memory impairment .  We discussed about gabapentin that may help her numbness and may help her insomnia however it has to be given by neurologist after further workup.  Patient and her sister agree with the plan.  At this time we will not change any medication.  Continue Klonopin 0.5 mg at bedtime, Seroquel 400 mg at bedtime, Celexa 20 mg daily.  She has no tremors or shakes.  I also encouraged to see Joaquim Lai for coping and social skills. Plan discussed with the patient and with her 2 sister.  Recommended to call us back if there is any question concern or if patient feels worsening of the symptoms.  Time spent 25 minutes.  More than 50% of the time spent in psychoeducation, counseling" of care. Discuss safety  plan that anytime having active suicidal thoughts or homicidal thoughts then patient need to call 911 or go to the local emergency room.   Jaythen Hamme T., MD 10/26/2015

## 2015-11-08 ENCOUNTER — Telehealth (HOSPITAL_COMMUNITY): Payer: Self-pay

## 2015-11-08 DIAGNOSIS — F0390 Unspecified dementia without behavioral disturbance: Secondary | ICD-10-CM | POA: Diagnosis not present

## 2015-11-08 DIAGNOSIS — E440 Moderate protein-calorie malnutrition: Secondary | ICD-10-CM | POA: Diagnosis not present

## 2015-11-08 DIAGNOSIS — N390 Urinary tract infection, site not specified: Secondary | ICD-10-CM | POA: Diagnosis not present

## 2015-11-08 DIAGNOSIS — F329 Major depressive disorder, single episode, unspecified: Secondary | ICD-10-CM | POA: Diagnosis not present

## 2015-11-08 DIAGNOSIS — R339 Retention of urine, unspecified: Secondary | ICD-10-CM | POA: Diagnosis not present

## 2015-11-08 DIAGNOSIS — M791 Myalgia: Secondary | ICD-10-CM | POA: Diagnosis not present

## 2015-11-08 DIAGNOSIS — K5900 Constipation, unspecified: Secondary | ICD-10-CM | POA: Diagnosis not present

## 2015-11-08 DIAGNOSIS — E538 Deficiency of other specified B group vitamins: Secondary | ICD-10-CM | POA: Diagnosis not present

## 2015-11-08 NOTE — Telephone Encounter (Signed)
Telephone call with patient after she left a message she is still not sleeping after adding Melatonin to regimen from Dr. Adele Schilder 10/26/15 evaluation.  Patient reported only sleeping 3-4 hours a night and discussed with her this nurse was not sure another doctor would be willing to change her medication without ever seeing her.  Agreed to discuss with Dr. Lovena Le covering this week for Dr. Adele Schilder and will call back to inform of what he thinks about her current medication and if he wishes to change anything prior to Dr. Adele Schilder returning on 11/15/15.  Patient agreed with plan and will call back once discussed with Dr. Lovena Le.

## 2015-11-09 NOTE — Telephone Encounter (Signed)
Telephone call with patient to follow up on another call stating she is still having problems with sleep. Informed patient Dr. Lovena Le reviewed patient's medications as she reports taking Melatonin Dr. Adele Schilder started too and thought she needed an increase in Seroquel.  Informed patient of ongoing problems with sleep by history and Dr. Lovena Le suggested patient try Cognitive Behavioral Therapy for Sleep and provided Kendra Simmons, CBT therapists name and phone number at [phone].  Patient reluctantly agreed to consider trying this and will call therapist to find out more about the therapy and if covered under Medicare.  Patient questioned when Dr. Adele Schilder would be back in the office and reports she will call back on Tuesday 11/15/15 if still having problems as she still reports thinking she needs an increase in Seroquel.  Encouraged patient to try CBT counseling for sleep due to history and she agreed at this time.

## 2015-11-15 ENCOUNTER — Telehealth (HOSPITAL_COMMUNITY): Payer: Self-pay

## 2015-11-15 NOTE — Telephone Encounter (Signed)
Telephone call with patient to inform Dr. Adele Schilder would not be willing to try any more medications for sleep.  Discussed patient's history of use of multiple medications but no real success in helping her consistently sleep better each night and informed Dr. Adele Schilder agreed with recommendation from Dr. Lovena Le for patient to contact [facility], therapist to try CBT for sleep.  Patient was not receptive to this plan but agreed.  Patient stated "well what does he want me to do in the mean time" and discussed continuing with present medications but to please call and see when they could begin seeing her for CBT therapy if she would be willing to try it.  Patient requested Dr. Adele Schilder call her back to discuss as was states she would like to speak to him about this but again reiterated due to patient's history with long term problems with sleep it was unlikely Dr. Adele Schilder would be willing to try more medication for this.  Patient requested Dr Adele Schilder call to discuss.

## 2015-11-16 ENCOUNTER — Ambulatory Visit (INDEPENDENT_AMBULATORY_CARE_PROVIDER_SITE_OTHER): Payer: Medicare Other | Admitting: Diagnostic Neuroimaging

## 2015-11-16 ENCOUNTER — Encounter: Payer: Self-pay | Admitting: Diagnostic Neuroimaging

## 2015-11-16 VITALS — BP 109/73 | HR 95 | Ht 63.5 in | Wt 164.0 lb

## 2015-11-16 DIAGNOSIS — R413 Other amnesia: Secondary | ICD-10-CM

## 2015-11-16 DIAGNOSIS — F489 Nonpsychotic mental disorder, unspecified: Secondary | ICD-10-CM

## 2015-11-16 DIAGNOSIS — F5105 Insomnia due to other mental disorder: Secondary | ICD-10-CM

## 2015-11-16 NOTE — Progress Notes (Signed)
[facility]  PATIENT: Kendra Simmons DOB: 11/21/1951  REFERRING CLINICIAN:  HISTORY FROM: patient and sister [name]) REASON FOR VISIT: follow up   HISTORICAL  CHIEF COMPLAINT:  Chief Complaint  Patient presents with  . Dementia    rm 7, sisterRomie Minus, MMSE 24, "generalized aching- getting worse"    HISTORY OF PRESENT ILLNESS:   UPDATE 11/16/15: Since last visit, patient is doing better with general functioning with increased family support. More problems with sleep. Memory and numbness are stable.  UPDATE 03/10/15: Since last visit, continues to decline physically, mentally, emotionally. Poor hygeine, PO intake, physical abilities. Multiple ER visits for panic attacks, weakness, pain. Family struggling to care for her at home. Patient doesn't want to move to assisted living or long term care.  NEW HPI 08/27/14: 64 year old female here for evaluation of numbness in feet. Previous evaluated for memory and mood issues. Past 1-2 months patient has had gradual onset, progressive numbness and tingling in the toes and feet. Symptoms are symmetric. No pain. No burning or pins and needles. She feels a mild annoying numbness sensation. No low back pain. No proximal symptoms. No symptoms in her hands.  UPDATE 01/04/14: Doing well. No new issues. Memory and mood issues are stable. She is concerned about cost of namenda (> $300 per month) and she cannot afford it. Also, she tells me that her dementia diagnosis is not clear, and that she was told that her memory issues are related to mood and pain issues.   PRIOR HPI (03/20/12, Dr. Erling Cruz): 64 year old right-handed white single female with a 4-1/2 year history of falls causing left elbow fracture 12/13/06. She is being followed by Dr. Caryl Comes and has been evaluated with a tilt table. She fell 05/23/10. She does not have syncope with falls. There is no warning. At times she feels sluggish before falling. She denies bowel or bladder  incontinence. Workup in the hospital for falls has included CT scans of the brain 12/13/06 and 09/01/07 showing bifrontal atrophy. MRI study of the brain 01/12/10 shows increased hyperintensity on T2 study of the  bilateral petrous apices representing pneumatized bone. The MRI also shows focal atrophy. She has vitamin B12 deficiency with methylmalonic acid of 207 homocystine 14.7 on 01/18/10. Other studies have shown vitamin D deficiency. RPR nonreactive, B12 254, cortisol 12.2, TSH 1.93, Cholesterol 267,HDL 51, LDL 90, CBC normal, and CMP normal. She denies a family history of dementia. She has a history of depression previously followed by [name] but is now going to the  [facility] .She is sleeping much better, feels much better, and has increased appetite. She is not having hallucinations. She is not having headaches, visual loss, double vision, or swallowing problems. She is able to live by herself and  drives a car which her sister says has been safe. The patient states "my head is clear" though I still have problems sleeping at night.08/29/2012= MMSE 25/30. Clock drawing task 4/4. Animal  fluency task 13. Ron Parker Index of independence in activities of daily living 6.  Instrumental  activities of daily living scale 7.  Neuropsychiatric inventory one for hallucinations, 5 for agitation, 6 for depression, 6 for anxiety, 5 for disinhibition,  5 for irritability, 8 for night time behaviors, and 6 for appetite change. Geriatric depression scale 7/15.  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 CBC, CMP, TSH  were normal except for slightly elevated liver function tests and  TSH 8.27 on 07/17/2011. She is doing well on Namenda. Her only complaint is lower jaw pain from a dry socket at tooth #18. She is concerned about weight gain on Abilify.   REVIEW OF SYSTEMS: Full 14 system review of systems performed and notable only for numbness depression  anxiety not enough sleep racing thoughts.    ALLERGIES: No Known Allergies  HOME MEDICATIONS: Outpatient Prescriptions Prior to Visit  Medication Sig Dispense Refill  . acetaminophen (TYLENOL) 500 MG tablet Take 1,000 mg by mouth every 6 (six) hours as needed for mild pain or headache.    . citalopram (CELEXA) 20 MG tablet Take 1 tablet (20 mg total) by mouth daily. 30 tablet 2  . clonazePAM (KLONOPIN) 0.5 MG tablet Take 1 tablet (0.5 mg total) by mouth at bedtime. 30 tablet 2  . memantine (NAMENDA) 10 MG tablet Take 1 tablet (10 mg total) by mouth 2 (two) times daily. 60 tablet 12  . mirtazapine (REMERON) 15 MG tablet Take 1 tablet (15 mg total) by mouth at bedtime. 30 tablet 2  . omeprazole (PRILOSEC) 20 MG capsule Take 1 capsule (20 mg total) by mouth daily. For acid reflux    . pravastatin (PRAVACHOL) 20 MG tablet Take 1 tablet (20 mg total) by mouth at bedtime. For high cholesterol    . QUEtiapine (SEROQUEL) 400 MG tablet Take 1 tablet (400 mg total) by mouth at bedtime. 30 tablet 2  . tamsulosin (FLOMAX) 0.4 MG CAPS capsule Take 1 capsule (0.4 mg total) by mouth daily after supper. For frequent urination/urgency 15 capsule 0  . megestrol (MEGACE) 400 MG/10ML suspension Take 10 mLs (400 mg total) by mouth daily. (Patient not taking: Reported on 11/16/2015) 240 mL 0   No facility-administered medications prior to visit.    PAST MEDICAL HISTORY: Past Medical History  Diagnosis Date  . High cholesterol   . Depression   . Anxiety   . GERD (gastroesophageal reflux disease)   . Insomnia   . Arthritis   . Memory loss   . Emotional depression 02/04/2015    PAST SURGICAL HISTORY: Past Surgical History  Procedure Laterality Date  . Cosmetic surgery    . Cesarean section    . Laproscopy      FAMILY HISTORY: Family History  Problem Relation Age of Onset  . Sleep apnea Father   . Alcohol abuse Father   . Diabetes Mother   . Diabetes Sister   . Diabetes Maternal Uncle   .  Diabetes Cousin     SOCIAL HISTORY:  Social History   Social History  . Marital Status: Divorced    Spouse Name: N/A  . Number of Children: 3  . Years of Education: 14   Occupational History  . Retired    Social History Main Topics  . Smoking status: Never Smoker   . Smokeless tobacco: Never Used  . Alcohol Use: No     Comment: zero  . Drug Use: No     Comment: Denies any drug use other than benzos  . Sexual Activity: No   Other Topics Concern  . Not on file   Social History Narrative   Patient lives at home alone.   Caffeine Use:  2 cokes daily   Sister lives next door.     PHYSICAL EXAM  Filed Vitals:   11/16/15 1014  BP: 109/73  Pulse: 95  Height: 5' 3.5" (1.613 m)  Weight: 164 lb (74.39 kg)    Not recorded  Body mass index is 28.59 kg/(m^2).  MMSE - Mini Mental State Exam 11/16/2015 03/10/2015  Orientation to time 5 4  Orientation to Place 5 5  Registration 3 3  Attention/ Calculation 2 1  Recall 1 0  Language- name 2 objects 2 2  Language- repeat 1 1  Language- follow 3 step command 3 3  Language- read & follow direction 1 1  Write a sentence 1 1  Copy design 0 0  Total score 24 21     GENERAL EXAM: Patient is in no distress; well developed, nourished and groomed; neck is supple  CARDIOVASCULAR: Regular rate and rhythm, no murmurs, no carotid bruits  NEUROLOGIC: MENTAL STATUS: awake, alert, language fluent, comprehension intact, naming intact, fund of knowledge appropriate; POSITIVE SNOUT, POSITIVE MYERSONS, POSITIVE PALMOMENTAL CRANIAL NERVE: pupils equal and reactive to light, visual fields full to confrontation, extraocular muscles intact, no nystagmus, facial sensation and strength symmetric, hearing intact, palate elevates symmetrically, uvula midline, shoulder shrug symmetric, tongue midline. MOTOR:  normal bulk and tone, DIFFUSE 4+/5 strength in the BUE, BLE; MILD POSTURAL TREMOR; MILD BRADYKINESIA IN BUE AND BLE SENSORY:  normal LT and vibration COORDINATION: finger-nose-finger, fine finger movements normal REFLEXES: BUE 2, KNEES 1, RIGHT ANKLE 1, LEFT ANKLE 0. DOWN GOING TOES GAIT/STATION: narrow based gait; STOOPED POSTURE, SMALL SHORT STEPS, ROMBERG NEG.     DIAGNOSTIC DATA (LABS, IMAGING, TESTING) - I reviewed patient records, labs, notes, testing and imaging myself where available.  Lab Results  Component Value Date   WBC 3.8* 07/04/2015   HGB 10.1* 07/04/2015   HCT 30.2* 07/04/2015   MCV 92.9 07/04/2015   PLT 108* 07/04/2015      Component Value Date/Time   NA 142 07/06/2015 0457   K 3.6 07/06/2015 0457   CL 112* 07/06/2015 0457   CO2 22 07/06/2015 0457   GLUCOSE 88 07/06/2015 0457   BUN 10 07/06/2015 0457   CREATININE 0.73 07/06/2015 0457   CALCIUM 8.8* 07/06/2015 0457   PROT 5.5* 07/04/2015 0428   ALBUMIN 3.4* 07/04/2015 0428   AST 21 07/04/2015 0428   ALT 16 07/04/2015 0428   ALKPHOS 42 07/04/2015 0428   BILITOT 0.7 07/04/2015 0428   GFRNONAA >60 07/06/2015 0457   GFRAA >60 07/06/2015 0457   Lab Results  Component Value Date   CHOL 177 11/04/2014   Lab Results  Component Value Date   HGBA1C 5.8* 11/04/2014   No results found for: [id number] Lab Results  Component Value Date   TSH 1.523 07/04/2015    I reviewed images myself and agree with interpretation. -VRP  08/15/11 MRI brain - Non-specific enlargement of the subarachnoid spaces over the bifrontal region, without definite underlying cortical atrophy.  02/06/14 CT head / cervical spine - Generalized atrophy. No acute intracranial abnormalities. Mild scattered degenerative disc and facet disease changes of the cervical spine. No acute cervical spine abnormalities.     ASSESSMENT AND PLAN  64 y.o. year old female here with depression, insomnia, chronic pain, and progressive cognitive decline, frontal release signs, brain atrophy on CT. Suspect underlying long stand mood disorder / depression. Not clear that patient  has superimposed neurodegenerative dementia (alzheimers vs dementia with lewy bodies), but brain atrophy and frotnal release signs raise this possibility. MMSE have been stable over past 3 years.     PLAN: I spent 25 minutes of face to face time with patient. Greater than 50% of time was spent in counseling and coordination of care with patient. In summary we  discussed:  - continue memantine 10mg  BID (will ask PCP to continue refills) - continue supervision / care per family - no further neurologic testing/treatment advised - follow up with PCP and psychiatry  Return for return to PCP.    Penni Bombard, MD [id number], [id number] AM Certified in Neurology, Neurophysiology and Neuroimaging  [facility] [address], [place], [place] [address] [phone]

## 2015-11-16 NOTE — Telephone Encounter (Signed)
I returned patient's phone call.  She saw a neurologist today who did not give her gabapentin.  Patient 1 more time consist to increase her Seroquel however given the fact that she has memory impairment and she is taking multiple psychiatric medication I would defer any further adjustment.  She has sleep study which was unremarkable.  I encourage to see CBT for sleep.  Patient accepted and agreed to explore her options.

## 2015-11-16 NOTE — Patient Instructions (Signed)

## 2015-11-16 NOTE — Telephone Encounter (Signed)
Patient left another message this date requesting Dr. Adele Schilder call her back to discuss her continued problems with not sleeping.  Now stating she cannot afford to do therapy, suggested CBT to help with sleep and wants him to call her to discuss.  States she only slept a few hours this past night and requests her call her sometime after 1pm this date as she has a neurology appointment this am.

## 2015-11-18 ENCOUNTER — Other Ambulatory Visit (HOSPITAL_COMMUNITY): Payer: Self-pay | Admitting: Psychiatry

## 2015-11-18 DIAGNOSIS — M2550 Pain in unspecified joint: Secondary | ICD-10-CM | POA: Diagnosis not present

## 2015-11-22 ENCOUNTER — Other Ambulatory Visit (HOSPITAL_COMMUNITY): Payer: Self-pay | Admitting: Psychiatry

## 2015-11-30 DIAGNOSIS — M1712 Unilateral primary osteoarthritis, left knee: Secondary | ICD-10-CM | POA: Diagnosis not present

## 2015-11-30 DIAGNOSIS — M25562 Pain in left knee: Secondary | ICD-10-CM | POA: Diagnosis not present

## 2015-11-30 DIAGNOSIS — M25511 Pain in right shoulder: Secondary | ICD-10-CM | POA: Diagnosis not present

## 2015-12-01 ENCOUNTER — Telehealth (HOSPITAL_COMMUNITY): Payer: Self-pay

## 2015-12-06 DIAGNOSIS — F411 Generalized anxiety disorder: Secondary | ICD-10-CM | POA: Diagnosis not present

## 2015-12-06 DIAGNOSIS — R42 Dizziness and giddiness: Secondary | ICD-10-CM | POA: Diagnosis not present

## 2015-12-06 DIAGNOSIS — R109 Unspecified abdominal pain: Secondary | ICD-10-CM | POA: Diagnosis not present

## 2015-12-06 DIAGNOSIS — R002 Palpitations: Secondary | ICD-10-CM | POA: Diagnosis not present

## 2015-12-07 NOTE — Telephone Encounter (Signed)
Telephone call with patient to follow up on a call she had left of continued problems with sleep.  Informed patient Dr. Adele Schilder is out of the office until the New Year but had spoken to him prior to going out and he stated he had already spoken with her and was not willing to change anything else on her current medications but supported plan for patient to try sleep therapy.  Patient stated she could not afford this and saw her PCP on 12/06/15 who told her is it was okay to take one Klonopin at night and one in the morning for the next few days to help with sleep and reported increased anxiety.  Patient questioned if would discuss with covering provider and agreed to do this but warned patient it was unlikely they would change anything too since Dr. Adele Schilder was not in agreement and Dr. Lovena Le was the one who originally supported her trying sleep therapy.  Informed Dr. Lovena Le is covering again this week but agreed to question again as patient states she is not sleeping more than a few hours a night.

## 2015-12-07 NOTE — Telephone Encounter (Signed)
Met with Dr.  who reviewed patient's record and discussed past recommendations and no changes to medications for sleep. Called patient back to inform Dr.  did not want to change any medication at this time and again suggested patient follow through with trying CBT for sleep therapy as the recommended treatment to really help with patient's ongoing sleep disturbance concerns.  Patient stated understanding and will call back as needed.  

## 2015-12-22 DIAGNOSIS — H8110 Benign paroxysmal vertigo, unspecified ear: Secondary | ICD-10-CM | POA: Diagnosis not present

## 2015-12-22 DIAGNOSIS — R1013 Epigastric pain: Secondary | ICD-10-CM | POA: Diagnosis not present

## 2015-12-30 ENCOUNTER — Telehealth (HOSPITAL_COMMUNITY): Payer: Self-pay | Admitting: *Deleted

## 2015-12-30 NOTE — Telephone Encounter (Signed)
Pt called stating she is more depressed not sleeping, not eating well. Pt did have oral surgery that is preventing her from eating well but needs medication for depression. Asking that Dr. Adele Schilder call her back as soon as possible. Did explain that if she has any suicidal or homicidal thought to be seen at [facility] psych ED. Pt in agreeable and states that she is not feeling that way currently.

## 2016-01-02 ENCOUNTER — Telehealth (HOSPITAL_COMMUNITY): Payer: Self-pay

## 2016-01-02 DIAGNOSIS — F331 Major depressive disorder, recurrent, moderate: Secondary | ICD-10-CM

## 2016-01-02 DIAGNOSIS — F330 Major depressive disorder, recurrent, mild: Secondary | ICD-10-CM

## 2016-01-02 MED ORDER — MIRTAZAPINE 30 MG PO TABS: 30.0000 mg | ORAL_TABLET | Freq: Every day | ORAL | Status: DC

## 2016-01-02 NOTE — Telephone Encounter (Signed)
Call return.  Patient is complaining of increased anxiety and poor sleep.  She insists that she wants to try a higher dose of sleep medication.  She is taking melatonin with limited effect.  We have long discussion about her medication and the side effects.  We will try Remeron 30 mg at bedtime to help anxiety and insomnia however discuss that it can cause weight gain, sedation, postural hypotension and nightmares.  Patient like to try increase Remeron.  I recommended to call us back if she feels any worsening of the symptoms.  Patient has appointment on February 9.

## 2016-01-12 ENCOUNTER — Encounter (HOSPITAL_COMMUNITY): Payer: Self-pay | Admitting: Psychiatry

## 2016-01-12 ENCOUNTER — Ambulatory Visit (INDEPENDENT_AMBULATORY_CARE_PROVIDER_SITE_OTHER): Payer: Medicare Other | Admitting: Psychiatry

## 2016-01-12 VITALS — BP 128/86 | HR 80 | Ht 64.5 in | Wt 172.6 lb

## 2016-01-12 DIAGNOSIS — F331 Major depressive disorder, recurrent, moderate: Secondary | ICD-10-CM | POA: Diagnosis not present

## 2016-01-12 DIAGNOSIS — F330 Major depressive disorder, recurrent, mild: Secondary | ICD-10-CM

## 2016-01-12 DIAGNOSIS — F4310 Post-traumatic stress disorder, unspecified: Secondary | ICD-10-CM | POA: Diagnosis not present

## 2016-01-12 MED ORDER — CLONAZEPAM 0.5 MG PO TABS: 0.5000 mg | ORAL_TABLET | Freq: Every day | ORAL | Status: DC

## 2016-01-12 MED ORDER — CITALOPRAM HYDROBROMIDE 20 MG PO TABS: 20.0000 mg | ORAL_TABLET | Freq: Every day | ORAL | Status: DC

## 2016-01-12 MED ORDER — QUETIAPINE FUMARATE 400 MG PO TABS: 400.0000 mg | ORAL_TABLET | Freq: Every day | ORAL | Status: DC

## 2016-01-12 MED ORDER — MIRTAZAPINE 30 MG PO TABS: 30.0000 mg | ORAL_TABLET | Freq: Every day | ORAL | Status: DC

## 2016-01-12 NOTE — Progress Notes (Signed)
[facility] [phone] Progress Note   JATYRA YOHMAN [id number] 65 y.o.  01/12/2016 3:53 PM  Chief Complaint:  I am sleeping better with Remeron but high gained weight.      History of Present Illness:  Kendra Simmons came for her follow-up appointment with her sisters.  She had called here few times requesting to increase her sleep medication.  She was recommended to try increase Remeron which helped her but now she is complaining weight gain.  I suggested to cut down Remeron but she refused.  She promised to watch carefully her diet and regular exercise.  Her sister endorse that she is walking every day and watching her diet.  She had a good Christmas.  Recently she see neurologist and there were no changes.  She gained 10 pounds in past 3 months.  She stopped taking megestrol as she does not need it.  Overall she described her mood is better and she denies any irritability, anger, mood swing.  She still have mild cognitive impairment she does not believe that she has memory impairment.  I read neurology notes and she has cerebral atrophy and frontal release signs .  She recently seen her primary care physician Dr. Nancy Fetter and she had normal EKG.  She was told her blood pressure was also good.  Overall she described her energy level is good.  She denies any paranoia, hallucination, irritability or any anger.  She denies any crying spells or any feeling of hopelessness or worthlessness. Patient has 24/ 7 help from her sister.  She is living at her place but her sisters are helping in her ADLs. Patient denies drinking or using any illegal substances.  She takes Seroquel, Celexa, Klonopin and Remeron.  She has no side effects including any EPS.  Suicidal Ideation: No Plan Formed: No Patient has means to carry out plan: No  Homicidal Ideation: No Plan Formed: No Patient has means to carry out plan: No  Past Psychiatric History/Hospitalization(s) Patient has multiple hospitalization. Her last  hospitalization was June 2016 .  She has done one intensive outpatient program.  She has a history of hallucination, paranoia, severe depression. In the past she has seen [name], [name] and [name].  As per chart she has taken in the past doxepin, Xanax, Seroquel, amitriptyline, imipramine, temazepam , trazodone , Zyprexa, Bellsomra, Luvox, Wellbutrin, Prozac and Vistaril.     Anxiety: Yes Bipolar Disorder: No Depression: Yes Mania: No Psychosis: History of hallucinations Schizophrenia: No Personality Disorder: No Hospitalization for psychiatric illness: Yes History of Electroconvulsive Shock Therapy: No Prior Suicide Attempts: No  Medical History; Patient has high cholesterol, memory impairment, GERD and palpitation.  She has history of frequent falls and seen by a neurologist in the past.  Her primary care physician is [name] in [place], [place].  Review of Systems  Constitutional: Negative for weight loss.  Eyes: Negative for blurred vision.  Respiratory: Negative.   Cardiovascular: Negative for chest pain and palpitations.  Musculoskeletal: Negative.   Skin: Negative for itching and rash.  Neurological: Negative for tremors and headaches.       Numbness in her feet  Psychiatric/Behavioral: Positive for memory loss.   Psychiatric: Agitation: No Hallucination: No Depressed Mood: No Insomnia: No Hypersomnia: No Altered Concentration: No Feels Worthless: No Grandiose Ideas: No Belief In Special Powers: No New/Increased Substance Abuse: No Compulsions: No  Neurologic: Headache: No Seizure: No Paresthesias: Yes   Musculoskeletal: Strength & Muscle Tone: within normal limits Gait &  Station: normal Patient leans: N/A   Outpatient Encounter Prescriptions as of 01/12/2016  Medication Sig  . acetaminophen (TYLENOL) 500 MG tablet Take 1,000 mg by mouth every 6 (six) hours as needed for mild pain or headache.  . citalopram (CELEXA) 20 MG tablet Take 1  tablet (20 mg total) by mouth daily.  . clonazePAM (KLONOPIN) 0.5 MG tablet Take 1 tablet (0.5 mg total) by mouth at bedtime.  . memantine (NAMENDA) 10 MG tablet Take 1 tablet (10 mg total) by mouth 2 (two) times daily.  . mirtazapine (REMERON) 30 MG tablet Take 1 tablet (30 mg total) by mouth at bedtime.  [name] omeprazole (PRILOSEC) 20 MG capsule Take 1 capsule (20 mg total) by mouth daily. For acid reflux  . pravastatin (PRAVACHOL) 20 MG tablet Take 1 tablet (20 mg total) by mouth at bedtime. For high cholesterol  . QUEtiapine (SEROQUEL) 400 MG tablet Take 1 tablet (400 mg total) by mouth at bedtime.  . tamsulosin (FLOMAX) 0.4 MG CAPS capsule Take 1 capsule (0.4 mg total) by mouth daily after supper. For frequent urination/urgency  . [DISCONTINUED] citalopram (CELEXA) 20 MG tablet Take 1 tablet (20 mg total) by mouth daily.  . [DISCONTINUED] clonazePAM (KLONOPIN) 0.5 MG tablet Take 1 tablet (0.5 mg total) by mouth at bedtime.  . [DISCONTINUED] megestrol (MEGACE) 400 MG/10ML suspension Take 10 mLs (400 mg total) by mouth daily. (Patient not taking: Reported on 11/16/2015)  . [DISCONTINUED] mirtazapine (REMERON) 30 MG tablet Take 1 tablet (30 mg total) by mouth at bedtime.  . [DISCONTINUED] QUEtiapine (SEROQUEL) 400 MG tablet Take 1 tablet (400 mg total) by mouth at bedtime.   No facility-administered encounter medications on file as of 01/12/2016.    No results found for this or any previous visit (from the past 2160 hour(s)).    Constitutional:  BP 128/86 mmHg  Pulse 80  Ht 5' 4.5" (1.638 m)  Wt 172 lb 9.6 oz (78.291 kg)  BMI 29.18 kg/m2   Mental Status Examination;  Patient is casually dressed and fairly groomed.  She is pleasant and cooperative.  She maintained good eye contact.  Her speech is slow, clear and coherent.  Her thought process slow and logical.  She described her mood euthymic and her affect is improved from the past.  Her psychomotor activity is normal.  She denies any  active or passive suicidal thoughts or homicidal thought.  There were no delusions, paranoia or any obsessive thoughts.  Her attention and concentration is fair.  Her fund of knowledge is average.  She has difficulty remembering things.  She has no tremors or shakes.  She is alert and oriented 3.  Her insight, judgment and impulse control is fair.   Established Problem, Stable/Improving (1), Review of Psycho-Social Stressors (1), Review or order clinical lab tests (1), Review and summation of old records (2), Review of Last Therapy Session (1), Review of Medication Regimen & Side Effects (2) and Review of New Medication or Change in Dosage (2)  Assessment: Axis I: Maj. depressive disorder, recurrent severe, posttraumatic stress disorder, cognitive disorder NOS  Axis II: Deferred  Axis III:  Past Medical History  Diagnosis Date  . High cholesterol   . Depression   . Anxiety   . GERD (gastroesophageal reflux disease)   . Insomnia   . Arthritis   . Memory loss   . Emotional depression 02/04/2015   Plan:  I reviewed records from her neurologist.  Patient is not taking Remeron 30 mg and tolerating  well.  She has gained weight and high recommended to stop Megace as she does not needed.  Strongly encouraged to do regular exercise and watch her calorie intake.  Patient does not want to reduce Remeron because she believed could help her sleep anxiety and depression.  We will get records from her primary care physician as patient recently had blood work including her EKG.  One more time I encouraged to see therapist but patient refused.  I will continue Remeron 30 mg at bedtime, Celexa 20 mg daily, Klonopin 0.5 mg at bedtime and Seroquel 400 mg at bedtime.  Discussed meditation side effects especially serotonin syndrome with 2 antidepressant.  Discuss perception offer insomnia and encourage if symptoms persist then she should see therapist.  Recommended to call us back if she has any question or any  concern.  Follow-up in 3 months. Time spent 25 minutes.  More than 50% of the time spent in psychoeducation, counseling" of care. Discuss safety plan that anytime having active suicidal thoughts or homicidal thoughts then patient need to call 911 or go to the local emergency room.   Alejos Reinhardt T., MD 01/12/2016

## 2016-01-18 DIAGNOSIS — M25562 Pain in left knee: Secondary | ICD-10-CM | POA: Diagnosis not present

## 2016-01-18 DIAGNOSIS — M1712 Unilateral primary osteoarthritis, left knee: Secondary | ICD-10-CM | POA: Diagnosis not present

## 2016-01-18 DIAGNOSIS — M545 Low back pain: Secondary | ICD-10-CM | POA: Diagnosis not present

## 2016-01-18 DIAGNOSIS — M25512 Pain in left shoulder: Secondary | ICD-10-CM | POA: Diagnosis not present

## 2016-01-18 DIAGNOSIS — M25572 Pain in left ankle and joints of left foot: Secondary | ICD-10-CM | POA: Diagnosis not present

## 2016-01-26 ENCOUNTER — Ambulatory Visit (HOSPITAL_COMMUNITY): Payer: Self-pay | Admitting: Psychiatry

## 2016-03-15 DIAGNOSIS — M25562 Pain in left knee: Secondary | ICD-10-CM | POA: Diagnosis not present

## 2016-03-15 DIAGNOSIS — M25512 Pain in left shoulder: Secondary | ICD-10-CM | POA: Diagnosis not present

## 2016-03-15 DIAGNOSIS — M545 Low back pain: Secondary | ICD-10-CM | POA: Diagnosis not present

## 2016-03-16 ENCOUNTER — Telehealth (HOSPITAL_COMMUNITY): Payer: Self-pay

## 2016-03-19 NOTE — Telephone Encounter (Signed)
I called the patient and let her know, she stated she was going to call back and try to move her appointment up because she is only sleeping 2 hours a night.

## 2016-03-19 NOTE — Telephone Encounter (Signed)
This is chronic issue and will discuss on next appointment.

## 2016-03-27 ENCOUNTER — Ambulatory Visit (INDEPENDENT_AMBULATORY_CARE_PROVIDER_SITE_OTHER): Payer: Medicare Other | Admitting: Psychiatry

## 2016-03-27 ENCOUNTER — Encounter (HOSPITAL_COMMUNITY): Payer: Self-pay | Admitting: Psychiatry

## 2016-03-27 VITALS — BP 128/74 | HR 101 | Ht 64.0 in | Wt 182.0 lb

## 2016-03-27 DIAGNOSIS — G3184 Mild cognitive impairment, so stated: Secondary | ICD-10-CM

## 2016-03-27 DIAGNOSIS — F330 Major depressive disorder, recurrent, mild: Secondary | ICD-10-CM

## 2016-03-27 DIAGNOSIS — F4310 Post-traumatic stress disorder, unspecified: Secondary | ICD-10-CM | POA: Diagnosis not present

## 2016-03-27 DIAGNOSIS — F331 Major depressive disorder, recurrent, moderate: Secondary | ICD-10-CM

## 2016-03-27 MED ORDER — MIRTAZAPINE 30 MG PO TABS: 30.0000 mg | ORAL_TABLET | Freq: Every day | ORAL | Status: DC

## 2016-03-27 MED ORDER — CLONAZEPAM 0.5 MG PO TABS: 0.5000 mg | ORAL_TABLET | Freq: Every day | ORAL | Status: DC

## 2016-03-27 MED ORDER — CITALOPRAM HYDROBROMIDE 20 MG PO TABS: 20.0000 mg | ORAL_TABLET | Freq: Every day | ORAL | Status: DC

## 2016-03-27 MED ORDER — PREGABALIN 25 MG PO CAPS: 25.0000 mg | ORAL_CAPSULE | Freq: Two times a day (BID) | ORAL | Status: DC

## 2016-03-27 MED ORDER — QUETIAPINE FUMARATE 400 MG PO TABS: 400.0000 mg | ORAL_TABLET | Freq: Every day | ORAL | Status: DC

## 2016-03-27 NOTE — Progress Notes (Signed)
[facility] [phone] Progress Note   CASARAH GARRICK [id number] 65 y.o.  03/27/2016 3:40 PM  Chief Complaint:  I am doing fine but I cannot sleep.  I gain weight.        History of Present Illness:  Kendra Simmons came for her follow-up appointment .  She has been doing very well however she continues to have chronic insomnia.  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  She is happy that she is able to drive and her memory is much improved.  She is able to do her ADLs.  Her energy level is good.  She is taking Namenda which has because she is difference in her memory impairment.  She believe Remeron is working for anxiety and depression but does not help with sleep.  She sleeping only 2-3 hours.  She gets tired easily.  Today she came by herself as she can drive.  Her sister is still helps her to organize the medication.  Patient was recently given Tylenol #3 pain medication but she is taking on occasions.  She is concerned about her weight gain.  Despite watching her calories and more active she continues to gain weight.  She does not want to change her psychiatric medication because she believe it is helping her depression and anxiety and nervousness.  She denies any paranoia, hallucination, suicidal thoughts or homicidal thought.  She described her mood is most of the time stable.  She denies any crying spells or any feeling of hopelessness or worthlessness.  She is compliant with Seroquel, Celexa, Klonopin and Remeron.  She has no tremors or shakes.  She like to try either gabapentin or Lyrica to help her neuropathy patient also help her pain.  Patient denies drinking or using any illegal substances.  Suicidal Ideation: No Plan Formed: No Patient has means to carry out plan: No  Homicidal Ideation: No Plan Formed: No Patient has means to carry out plan: No  Past Psychiatric History/Hospitalization(s) Patient has multiple hospitalization. Her last hospitalization was June  2016 .  She has done one intensive outpatient program.  She has a history of hallucination, paranoia, severe depression. In the past she has seen [name], [name] and [name].  As per chart she has taken in the past doxepin, Xanax, Seroquel, amitriptyline, imipramine, temazepam , trazodone , Zyprexa, Bellsomra, Luvox, Wellbutrin, Prozac and Vistaril.     Anxiety: Yes Bipolar Disorder: No Depression: Yes Mania: No Psychosis: History of hallucinations Schizophrenia: No Personality Disorder: No Hospitalization for psychiatric illness: Yes History of Electroconvulsive Shock Therapy: No Prior Suicide Attempts: No  Medical History; Patient has high cholesterol, memory impairment, GERD and palpitation.  She has history of frequent falls and seen by a neurologist in the past.  Her primary care physician is [name] in [place], [place].  Review of Systems  Constitutional: Negative for weight loss.  Eyes: Negative for blurred vision.  Respiratory: Negative.   Cardiovascular: Negative for chest pain and palpitations.  Musculoskeletal: Negative.   Skin: Negative for itching and rash.  Neurological: Negative for tremors and headaches.       Numbness in her feet  Psychiatric/Behavioral: Positive for memory loss. Negative for suicidal ideas and substance abuse.   Psychiatric: Agitation: No Hallucination: No Depressed Mood: No Insomnia: No Hypersomnia: No Altered Concentration: No Feels Worthless: No Grandiose Ideas: No Belief In Special Powers: No New/Increased Substance Abuse: No Compulsions: No  Neurologic: Headache: No Seizure: No Paresthesias: Yes   Musculoskeletal:  Strength & Muscle Tone: within normal limits Gait & Station: normal Patient leans: N/A   Outpatient Encounter Prescriptions as of 03/27/2016  Medication Sig  . acetaminophen (TYLENOL) 500 MG tablet Take 1,000 mg by mouth every 6 (six) hours as needed for mild pain or headache.  . citalopram (CELEXA)  20 MG tablet Take 1 tablet (20 mg total) by mouth daily.  . clonazePAM (KLONOPIN) 0.5 MG tablet Take 1 tablet (0.5 mg total) by mouth at bedtime.  . CVS VITAMIN B-6 100 MG tablet 1 TABLET BY MOUTH TWICE DAILY  . meloxicam (MOBIC) 7.5 MG tablet TAKE 1 TABLET BY MOUTH TWICE DAILY AS NEEDED FOR INFLAMMATION AND PAIN  . memantine (NAMENDA) 10 MG tablet Take 1 tablet (10 mg total) by mouth 2 (two) times daily.  . mirtazapine (REMERON) 30 MG tablet Take 1 tablet (30 mg total) by mouth at bedtime.  [name] nystatin (MYCOSTATIN/NYSTOP) 100000 UNIT/GM POWD APPLY TO AFFECTED AREAS TWICE A DAY EXTERNALLY  . omeprazole (PRILOSEC) 20 MG capsule Take 1 capsule (20 mg total) by mouth daily. For acid reflux  . pravastatin (PRAVACHOL) 20 MG tablet Take 1 tablet (20 mg total) by mouth at bedtime. For high cholesterol  . pregabalin (LYRICA) 25 MG capsule Take 1 capsule (25 mg total) by mouth 2 (two) times daily.  . QUEtiapine (SEROQUEL) 400 MG tablet Take 1 tablet (400 mg total) by mouth at bedtime.  . tamsulosin (FLOMAX) 0.4 MG CAPS capsule Take 1 capsule (0.4 mg total) by mouth daily after supper. For frequent urination/urgency  . [DISCONTINUED] citalopram (CELEXA) 20 MG tablet Take 1 tablet (20 mg total) by mouth daily.  . [DISCONTINUED] clonazePAM (KLONOPIN) 0.5 MG tablet Take 1 tablet (0.5 mg total) by mouth at bedtime.  . [DISCONTINUED] mirtazapine (REMERON) 30 MG tablet Take 1 tablet (30 mg total) by mouth at bedtime.  . [DISCONTINUED] QUEtiapine (SEROQUEL) 400 MG tablet Take 1 tablet (400 mg total) by mouth at bedtime.   No facility-administered encounter medications on file as of 03/27/2016.    No results found for this or any previous visit (from the past 2160 hour(s)).    Constitutional:  BP 128/74 mmHg  Pulse 101  Ht 5\' 4"  (1.626 m)  Wt 182 lb (82.555 kg)  BMI 31.22 kg/m2   Mental Status Examination;  Patient is casually dressed and fairly groomed.  She is pleasant and cooperative.  She maintained  good eye contact.  Her speech is slow, clear and coherent.  Her thought process slow and logical.  She described her mood euthymic and her affect is improved from the past.  Her psychomotor activity is normal.  She denies any active or passive suicidal thoughts or homicidal thought.  There were no delusions, paranoia or any obsessive thoughts.  Her attention and concentration is good.  Her fund of knowledge is okay. She has no tremors or shakes.  She is alert and oriented 3.  Her insight, judgment and impulse control is fair.   Established Problem, Stable/Improving (1), Review of Psycho-Social Stressors (1), Review or order clinical lab tests (1), New Problem, with no additional work-up planned (3), Review of Last Therapy Session (1), Review of Medication Regimen & Side Effects (2) and Review of New Medication or Change in Dosage (2)  Assessment: Axis I: Maj. depressive disorder, recurrent severe, posttraumatic stress disorder, cognitive disorder NOS  Axis II: Deferred  Axis III:  Past Medical History  Diagnosis Date  . High cholesterol   . Depression   . Anxiety   .  GERD (gastroesophageal reflux disease)   . Insomnia   . Arthritis   . Memory loss   . Emotional depression 02/04/2015   Plan:  I reviewed records And collateral information.  She has seen neurologists however she was not given any medication for neuropathy.  Patient really like to try gabapentin or Lyrica .  We will start low-dose Lyrica to help her neuropathy pain and also help her residual anxiety and insomnia.  For now she will continue Remeron, Seroquel, Klonopin and Celexa.  However if she do not see any improvement with Lyrica then I recommended to call us back and discontinue and we will suggest to see the neurologist .  Discussed polypharmacy and medication side effects.  Overall patient is improved from the past. I will continue Remeron 30 mg at bedtime, Celexa 20 mg daily, Klonopin 0.5 mg at bedtime and Seroquel 400 mg at  bedtime.  Discussed meditation side effects especially serotonin syndrome with 2 antidepressant.  Recommended to call us back if she has any question or any concern.  Follow-up in 3 months. Time spent 25 minutes.  More than 50% of the time spent in psychoeducation, counseling" of care. Discuss safety plan that anytime having active suicidal thoughts or homicidal thoughts then patient need to call 911 or go to the local emergency room.   Eleftherios Dudenhoeffer T., MD 03/27/2016

## 2016-03-28 ENCOUNTER — Telehealth (HOSPITAL_COMMUNITY): Payer: Self-pay

## 2016-03-28 NOTE — Telephone Encounter (Signed)
She can take both Lyrica at bedtime.

## 2016-03-28 NOTE — Telephone Encounter (Signed)
Patient calling, she states that she had a really good nights sleep last night. She would like to know if she can take both Lyrica at night because when she took it this morning she fell back to sleep. She does not want to feel sleepy during the night. Please review and advise, thank you

## 2016-03-29 NOTE — Telephone Encounter (Signed)
I called patient and let her know that it would be fine to take both Lyrica at night. Patient had also inquired about medication assistance for the Lyrica and I will mail the application to patient today.

## 2016-04-05 ENCOUNTER — Telehealth (HOSPITAL_COMMUNITY): Payer: Self-pay

## 2016-04-05 DIAGNOSIS — F330 Major depressive disorder, recurrent, mild: Secondary | ICD-10-CM

## 2016-04-05 NOTE — Telephone Encounter (Signed)
Patient is calling because she has filled out the application to send to [employer] to help with her Lyrica. They need an original prescription to be faxed along with the application and her financials. Is it okay to print the prescription for her to fax? They would like it to be for 6 months as well - please review and advise, thank you

## 2016-04-06 MED ORDER — PREGABALIN 25 MG PO CAPS: 25.0000 mg | ORAL_CAPSULE | Freq: Two times a day (BID) | ORAL | Status: DC

## 2016-04-06 NOTE — Telephone Encounter (Signed)
Printed per Dr. Adele Schilder, I will mail to patient on Monday

## 2016-04-10 ENCOUNTER — Telehealth (HOSPITAL_COMMUNITY): Payer: Self-pay

## 2016-04-10 NOTE — Telephone Encounter (Signed)
Mindel picked up prescription on [id number] lic [id number]  dlo

## 2016-04-12 ENCOUNTER — Ambulatory Visit (HOSPITAL_COMMUNITY): Payer: Self-pay | Admitting: Psychiatry

## 2016-05-02 DIAGNOSIS — M1712 Unilateral primary osteoarthritis, left knee: Secondary | ICD-10-CM | POA: Diagnosis not present

## 2016-05-02 DIAGNOSIS — K5900 Constipation, unspecified: Secondary | ICD-10-CM | POA: Diagnosis not present

## 2016-05-02 DIAGNOSIS — R635 Abnormal weight gain: Secondary | ICD-10-CM | POA: Diagnosis not present

## 2016-05-02 DIAGNOSIS — R002 Palpitations: Secondary | ICD-10-CM | POA: Diagnosis not present

## 2016-05-02 DIAGNOSIS — F329 Major depressive disorder, single episode, unspecified: Secondary | ICD-10-CM | POA: Diagnosis not present

## 2016-05-24 DIAGNOSIS — M25562 Pain in left knee: Secondary | ICD-10-CM | POA: Diagnosis not present

## 2016-05-24 DIAGNOSIS — M25511 Pain in right shoulder: Secondary | ICD-10-CM | POA: Diagnosis not present

## 2016-06-12 ENCOUNTER — Emergency Department (HOSPITAL_COMMUNITY)
Admission: EM | Admit: 2016-06-12 | Discharge: 2016-06-12 | Disposition: A | Discharge status: Home / Self Care | Payer: Medicare Other | Attending: Emergency Medicine | Admitting: Emergency Medicine

## 2016-06-12 ENCOUNTER — Encounter (HOSPITAL_COMMUNITY): Payer: Self-pay | Admitting: Emergency Medicine

## 2016-06-12 ENCOUNTER — Emergency Department (HOSPITAL_COMMUNITY): Payer: Medicare Other

## 2016-06-12 DIAGNOSIS — Z791 Long term (current) use of non-steroidal anti-inflammatories (NSAID): Secondary | ICD-10-CM | POA: Diagnosis not present

## 2016-06-12 DIAGNOSIS — S63602A Unspecified sprain of left thumb, initial encounter: Secondary | ICD-10-CM | POA: Insufficient documentation

## 2016-06-12 DIAGNOSIS — M25532 Pain in left wrist: Secondary | ICD-10-CM | POA: Diagnosis not present

## 2016-06-12 DIAGNOSIS — M1990 Unspecified osteoarthritis, unspecified site: Secondary | ICD-10-CM | POA: Diagnosis not present

## 2016-06-12 DIAGNOSIS — F3289 Other specified depressive episodes: Secondary | ICD-10-CM | POA: Diagnosis not present

## 2016-06-12 DIAGNOSIS — Y999 Unspecified external cause status: Secondary | ICD-10-CM | POA: Diagnosis not present

## 2016-06-12 DIAGNOSIS — Y92009 Unspecified place in unspecified non-institutional (private) residence as the place of occurrence of the external cause: Secondary | ICD-10-CM | POA: Diagnosis not present

## 2016-06-12 DIAGNOSIS — M79642 Pain in left hand: Secondary | ICD-10-CM | POA: Diagnosis not present

## 2016-06-12 DIAGNOSIS — Y939 Activity, unspecified: Secondary | ICD-10-CM | POA: Insufficient documentation

## 2016-06-12 DIAGNOSIS — Z79899 Other long term (current) drug therapy: Secondary | ICD-10-CM | POA: Insufficient documentation

## 2016-06-12 DIAGNOSIS — S63502A Unspecified sprain of left wrist, initial encounter: Secondary | ICD-10-CM | POA: Diagnosis not present

## 2016-06-12 DIAGNOSIS — S6992XA Unspecified injury of left wrist, hand and finger(s), initial encounter: Secondary | ICD-10-CM | POA: Diagnosis not present

## 2016-06-12 DIAGNOSIS — T7411XA Adult physical abuse, confirmed, initial encounter: Secondary | ICD-10-CM | POA: Diagnosis not present

## 2016-06-12 MED ORDER — IBUPROFEN 200 MG PO TABS
400.0000 mg | ORAL_TABLET | Freq: Once | ORAL | Status: AC
  Administered 2016-06-12: 400 mg via ORAL
  Filled 2016-06-12: qty 2
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
## 2016-06-12 NOTE — Discharge Instructions (Signed)
Wear thumb spica splint for at least 2 weeks for stabilization of wrist/thumb. Ice and elevate wrist throughout the day, using ice pack for no more than 20 minutes every hour. Alternate between tylenol and motrin as needed for pain. Call hand specialist follow up today or tomorrow to schedule followup appointment for recheck of ongoing wrist/thumb pain in 1 week. Return to the ER for changes or worsening symptoms.    Thumb Sprain A thumb sprain is an injury to one of the strong bands of tissue (ligaments) that connect the bones in your thumb. The ligament can be stretched too much or it can tear. A tear can be either partial or complete. The severity of the sprain depends on how much of the ligament was damaged or torn. CAUSES A thumb sprain is often caused by a fall or an accident. If you extend your hands to catch an object or to protect yourself, the force of the impact can cause your ligament to stretch too much. This excess tension can also cause your ligament to tear. RISK FACTORS This injury is more likely to occur in people who play:  Sports that involve a greater risk of falling, such as skiing.  Sports that involve catching an object, such as basketball. SYMPTOMS Symptoms of this condition include:  Loss of motion in your thumb.  Bruising.  Tenderness.  Swelling. DIAGNOSIS This condition is diagnosed with a medical history and physical exam. You may also have an X-ray of your thumb. TREATMENT Treatment varies depending on the severity of your sprain. If your ligament is overstretched or partially torn, treatment usually involves keeping your thumb in a fixed position (immobilization) for a period of time. To help you do this, your health care provider will apply a bandage, cast, or splint to keep your thumb from moving until it heals. If your ligament is fully torn, you may need surgery to reconnect the ligament to the bone. After surgery, a cast or splint will be applied and  will need to stay on your thumb while it heals. Your health care provider may also suggest exercises or physical therapy to strengthen your thumb. HOME CARE INSTRUCTIONS If You Have a Cast:  Do not stick anything inside the cast to scratch your skin. Doing that increases your risk of infection.  Check the skin around the cast every day. Report any concerns to your health care provider. You may put lotion on dry skin around the edges of the cast. Do not apply lotion to the skin underneath the cast.  Keep the cast clean and dry. If You Have a Splint:  Wear it as directed by your health care provider. Remove it only as directed by your health care provider.  Loosen the splint if your fingers become numb and tingle, or if they turn cold and blue.  Keep the splint clean and dry. Bathing  Cover the bandage, cast, or splint with a watertight plastic bag to protect it from water while you take a bath or a shower. Do not let the bandage, cast, or splint get wet. Managing Pain, Stiffness, and Swelling   If directed, apply ice to the injured area (unless you have a cast):  Put ice in a plastic bag.  Place a towel between your skin and the bag.  Leave the ice on for 20 minutes, 2-3 times per day.  Move your fingers often to avoid stiffness and to lessen swelling.  Raise (elevate) the injured area above the level of your  heart while you are sitting or lying down. Driving  Do not drive or operate heavy machinery while taking pain medicine.  Do not drive while wearing a cast or splint on a hand that you use for driving. General Instructions  Do not put pressure on any part of your cast or splint until it is fully hardened. This may take several hours.  Take medicines only as directed by your health care provider. These include over-the-counter medicines and prescription medicines.  Keep all follow-up visits as directed by your health care provider. This is important.  Do any exercise  or physical therapy as directed by your health care provider.  Do not wear rings on your injured thumb. SEEK MEDICAL CARE IF:  Your pain is not controlled with medicine.  Your bruising or swelling gets worse.  Your cast or splint is damaged. SEEK IMMEDIATE MEDICAL CARE IF:  Your thumb is numb or blue.  Your thumb feels colder than normal.   This information is not intended to replace advice given to you by your health care provider. Make sure you discuss any questions you have with your health care provider.   Document Released: 01/10/2005 Document Revised: 04/19/2015 Document Reviewed: 09/14/2014 Elsevier Interactive Patient Education 2016 [employer]  Wrist Sprain A wrist sprain is a stretch or tear in the strong, fibrous tissues (ligaments) that connect your wrist bones. The ligaments of your wrist may be easily sprained. There are three types of wrist sprains.  Grade 1. The ligament is not stretched or torn, but the sprain causes pain.  Grade 2. The ligament is stretched or partially torn. You may be able to move your wrist, but not very much.  Grade 3. The ligament or muscle completely tears. You may find it difficult or extremely painful to move your wrist even a little. CAUSES Often, wrist sprains are a result of a fall or an injury. The force of the impact causes the fibers of your ligament to stretch too much or tear. Common causes of wrist sprains include:  Overextending your wrist while catching a ball with your hands.  Repetitive or strenuous extension or bending of your wrist.  Landing on your hand during a fall. RISK FACTORS  Having previous wrist injuries.  Playing contact sports, such as boxing or wrestling.  Participating in activities in which falling is common.  Having poor wrist strength and flexibility. SIGNS AND SYMPTOMS  Wrist pain.  Wrist tenderness.  Inflammation or bruising of the wrist area.  Hearing a "pop" or feeling a tear at the  time of the injury.  Decreased wrist movement due to pain, stiffness, or weakness. DIAGNOSIS Your health care provider will examine your wrist. In some cases, an X-ray will be taken to make sure you did not break any bones. If your health care provider thinks that you tore a ligament, he or she may order an MRI of your wrist. TREATMENT Treatment involves resting and icing your wrist. You may also need to take pain medicines to help lessen pain and inflammation. Your health care provider may recommend keeping your wrist still (immobilized) with a splint to help your sprain heal. When the splint is no longer necessary, you may need to perform strengthening and stretching exercises. These exercises help you to regain strength and full range of motion in your wrist. Surgery is not usually needed for wrist sprains unless the ligament completely tears. HOME CARE INSTRUCTIONS  Rest your wrist. Do not do things that cause pain.  Wear your wrist splint as directed by your health care provider.  Take medicines only as directed by your health care provider.  To ease pain and swelling, apply ice to the injured area.  Put ice in a plastic bag.  Place a towel between your skin and the bag.  Leave the ice on for 20 minutes, 2-3 times a day. SEEK MEDICAL CARE IF:  Your pain, discomfort, or swelling gets worse even with treatment.  You feel sudden numbness in your hand.   This information is not intended to replace advice given to you by your health care provider. Make sure you discuss any questions you have with your health care provider.   Document Released: 08/06/2014 Document Reviewed: 08/06/2014 Elsevier Interactive Patient Education [employer].  Cryotherapy Cryotherapy is when you put ice on your injury. Ice helps lessen pain and puffiness (swelling) after an injury. Ice works the best when you start using it in the first 24 to 48 hours after an injury. HOME CARE  Put a dry or damp  towel between the ice pack and your skin.  You may press gently on the ice pack.  Leave the ice on for no more than 10 to 20 minutes at a time.  Check your skin after 5 minutes to make sure your skin is okay.  Rest at least 20 minutes between ice pack uses.  Stop using ice when your skin loses feeling (numbness).  Do not use ice on someone who cannot tell you when it hurts. This includes small children and people with memory problems (dementia). GET HELP RIGHT AWAY IF:  You have white spots on your skin.  Your skin turns blue or pale.  Your skin feels waxy or hard.  Your puffiness gets worse. MAKE SURE YOU:   Understand these instructions.  Will watch your condition.  Will get help right away if you are not doing well or get worse.   This information is not intended to replace advice given to you by your health care provider. Make sure you discuss any questions you have with your health care provider.   Document Released: 05/21/2008 Document Revised: 02/25/2012 Document Reviewed: 07/26/2011 Elsevier Interactive Patient Education [employer].

## 2016-06-13 ENCOUNTER — Telehealth (HOSPITAL_COMMUNITY): Payer: Self-pay

## 2016-06-13 NOTE — Telephone Encounter (Signed)
Patient is calling because she would like to be place back in charge of her medications. Patients sister is currently her medical POA and patient states that her sister will allow her to keep and dispense her own medications if you say that you feel she is capable of that. Patient states that she is much better than she used to be and she feels that she can take on this responsibility. Please review and advise, thank you.

## 2016-06-13 NOTE — Telephone Encounter (Signed)
We will discuss this on her next appointment.

## 2016-06-14 NOTE — Telephone Encounter (Signed)
Called patient and let her know, she would really like this done soon so she is going to try and move her appointment up

## 2016-06-17 ENCOUNTER — Encounter (HOSPITAL_COMMUNITY): Payer: Self-pay | Admitting: *Deleted

## 2016-06-17 ENCOUNTER — Emergency Department (HOSPITAL_COMMUNITY)
Admission: EM | Admit: 2016-06-17 | Discharge: 2016-06-17 | Disposition: A | Discharge status: Home / Self Care | Payer: Medicare Other | Attending: Emergency Medicine | Admitting: Emergency Medicine

## 2016-06-17 DIAGNOSIS — R0989 Other specified symptoms and signs involving the circulatory and respiratory systems: Secondary | ICD-10-CM | POA: Diagnosis present

## 2016-06-17 DIAGNOSIS — T17228A Food in pharynx causing other injury, initial encounter: Secondary | ICD-10-CM | POA: Diagnosis not present

## 2016-06-17 DIAGNOSIS — Z79899 Other long term (current) drug therapy: Secondary | ICD-10-CM | POA: Insufficient documentation

## 2016-06-17 DIAGNOSIS — X58XXXA Exposure to other specified factors, initial encounter: Secondary | ICD-10-CM | POA: Diagnosis not present

## 2016-06-17 DIAGNOSIS — Y999 Unspecified external cause status: Secondary | ICD-10-CM | POA: Diagnosis not present

## 2016-06-17 DIAGNOSIS — F329 Major depressive disorder, single episode, unspecified: Secondary | ICD-10-CM | POA: Diagnosis not present

## 2016-06-17 DIAGNOSIS — T18120A Food in esophagus causing compression of trachea, initial encounter: Secondary | ICD-10-CM | POA: Diagnosis not present

## 2016-06-17 DIAGNOSIS — Y9389 Activity, other specified: Secondary | ICD-10-CM | POA: Insufficient documentation

## 2016-06-17 DIAGNOSIS — M1990 Unspecified osteoarthritis, unspecified site: Secondary | ICD-10-CM | POA: Insufficient documentation

## 2016-06-17 DIAGNOSIS — Y9222 Religious institution as the place of occurrence of the external cause: Secondary | ICD-10-CM | POA: Insufficient documentation

## 2016-06-17 DIAGNOSIS — T18128A Food in esophagus causing other injury, initial encounter: Secondary | ICD-10-CM

## 2016-06-17 DIAGNOSIS — K222 Esophageal obstruction: Secondary | ICD-10-CM | POA: Diagnosis not present

## 2016-06-17 NOTE — Discharge Instructions (Signed)
Please follow with your primary care doctor in the next 2 days for a check-up. They must obtain records for further management.  ° °Do not hesitate to return to the Emergency Department for any new, worsening or concerning symptoms.  ° °

## 2016-06-17 NOTE — ED Provider Notes (Signed)
CSN: [id number]     Arrival date & time 06/17/16  1317 History   First MD Initiated Contact with Patient 06/17/16 1321     Chief Complaint  Patient presents with  . Choking     (Consider location/radiation/quality/duration/timing/severity/associated sxs/prior Treatment) HPI  Blood pressure 127/72, pulse 82, temperature 98.3 F (36.8 C), temperature source Oral, resp. rate 16, SpO2 98 %.  Kendra Simmons is a 65 y.o. female complaining of choking sensation after she was eating a hot dog, she was gurgling and felt like she couldn't swallow however she was breathing appropriately, she felt larger throat, a passerby performed the Heimlich maneuver with little relief. She had a similar episode approximately 3 years ago, has never been evaluated by gastroenterology. When patient was being transferred by EMS from stretcher to hospital bed she let out a large burp and felt that the food bolus went down her esophagus. States that she feels much improved.  Past Medical History  Diagnosis Date  . High cholesterol   . Depression   . Anxiety   . GERD (gastroesophageal reflux disease)   . Insomnia   . Arthritis   . Memory loss   . Emotional depression 02/04/2015   Past Surgical History  Procedure Laterality Date  . Cosmetic surgery    . Cesarean section    . Laproscopy     Family History  Problem Relation Age of Onset  . Sleep apnea Father   . Alcohol abuse Father   . Diabetes Mother   . Diabetes Sister   . Diabetes Maternal Uncle   . Diabetes Cousin    Social History  Substance Use Topics  . Smoking status: Never Smoker   . Smokeless tobacco: Never Used  . Alcohol Use: No     Comment: zero   OB History    No data available     Review of Systems  10 systems reviewed and found to be negative, except as noted in the HPI.   Allergies  Review of patient's allergies indicates no known allergies.  Home Medications   Prior to Admission medications   Medication Sig Start  Date End Date Taking? Authorizing Provider  acetaminophen (TYLENOL) 500 MG tablet Take 1,000 mg by mouth every 6 (six) hours as needed for mild pain or headache.    Historical Provider, MD  citalopram (CELEXA) 20 MG tablet Take 1 tablet (20 mg total) by mouth daily. 03/27/16   [name], MD  clonazePAM (KLONOPIN) 0.5 MG tablet Take 1 tablet (0.5 mg total) by mouth at bedtime. 03/27/16   [name], MD  [facility] VITAMIN B-6 100 MG tablet 1 TABLET BY MOUTH TWICE DAILY 03/15/16   Historical Provider, MD  ibuprofen (ADVIL,MOTRIN) 200 MG tablet Take 400 mg by mouth 2 (two) times daily.    Historical Provider, MD  lansoprazole (PREVACID) 30 MG capsule Take 30 mg by mouth daily at 12 noon.    Historical Provider, MD  meloxicam (MOBIC) 7.5 MG tablet TAKE 1 TABLET BY MOUTH TWICE DAILY AS NEEDED FOR INFLAMMATION AND PAIN 03/15/16   Historical Provider, MD  memantine (NAMENDA) 10 MG tablet Take 1 tablet (10 mg total) by mouth 2 (two) times daily. 06/02/15   Shuvon B Rankin, NP  mirtazapine (REMERON) 30 MG tablet Take 1 tablet (30 mg total) by mouth at bedtime. 03/27/16   [name], MD  nystatin (MYCOSTATIN/NYSTOP) 100000 UNIT/GM POWD APPLY TO AFFECTED AREAS TWICE A DAY EXTERNALLY 01/23/16   Historical Provider, MD  omeprazole (PRILOSEC) 20 MG capsule Take 1 capsule (20 mg total) by mouth daily. For acid reflux Patient not taking: Reported on 06/12/2016 06/02/15   Shuvon B Rankin, NP  pravastatin (PRAVACHOL) 20 MG tablet Take 1 tablet (20 mg total) by mouth at bedtime. For high cholesterol 06/02/15   Shuvon B Rankin, NP  pregabalin (LYRICA) 25 MG capsule Take 1 capsule (25 mg total) by mouth 2 (two) times daily. Patient taking differently: Take 50 mg by mouth at bedtime.  04/06/16 04/06/17  [name], MD  QUEtiapine (SEROQUEL) 400 MG tablet Take 1 tablet (400 mg total) by mouth at bedtime. 03/27/16   [name], MD  tamsulosin (FLOMAX) 0.4 MG CAPS capsule Take 1 capsule (0.4 mg total) by mouth daily after  supper. For frequent urination/urgency 06/02/15   Shuvon B Rankin, NP   BP 127/72 mmHg  Pulse 82  Temp(Src) 98.3 F (36.8 C) (Oral)  Resp 16  SpO2 98% Physical Exam  Constitutional: She is oriented to person, place, and time. She appears well-developed and well-nourished. No distress.  HENT:  Head: Normocephalic.  Mouth/Throat: Oropharynx is clear and moist.  Eyes: Conjunctivae and EOM are normal. Pupils are equal, round, and reactive to light.  Cardiovascular: Normal rate, regular rhythm and intact distal pulses.   Pulmonary/Chest: Effort normal and breath sounds normal. No stridor. No respiratory distress. She has no wheezes. She has no rales. She exhibits no tenderness.  Lung sounds clear to auscultation  Abdominal: Soft. Bowel sounds are normal. She exhibits no mass. There is no tenderness. There is no rebound and no guarding.  Musculoskeletal: Normal range of motion.  Neurological: She is alert and oriented to person, place, and time.  Psychiatric: She has a normal mood and affect.  Nursing note and vitals reviewed.   ED Course  Procedures (including critical care time) Labs Review Labs Reviewed - No data to display  Imaging Review No results found. I have personally reviewed and evaluated these images and lab results as part of my medical decision-making.   EKG Interpretation None      MDM   Final diagnoses:  Food impaction of esophagus, initial encounter   Filed Vitals:   06/17/16 1344  BP: 127/72  Pulse: 82  Temp: 98.3 F (36.8 C)  TempSrc: Oral  Resp: 16  SpO2: 98%     Kendra Simmons is 65 y.o. female presenting with Food bolus impaction from hot dog, patient felt very uncomfortable, felt stuck in her esophagus. Patient had a large belch and feel that she swallowed it, she has no history of prior dilation, has never had a gastroenterology evaluation, of note this patient is 13 and has also never had a colonoscopy, counseled her on the importance of  obtaining colonoscopies and she will need evaluation for possible esophageal stricture. Referral is given to [facility], we've had an extensive discussion of return precautions and patient verbalized her understanding. Patient is tolerating by mouth's in the ED with out issue and states significantly improved after burping.  Evaluation does not show pathology that would require ongoing emergent intervention or inpatient treatment. Pt is hemodynamically stable and mentating appropriately. Discussed findings and plan with patient/guardian, who agrees with care plan. All questions answered. Return precautions discussed and outpatient follow up given.    Monico Blitz, PA-C 06/17/16 [place], MD 06/17/16 [phone]

## 2016-06-17 NOTE — ED Notes (Signed)
Per EMS [place] was in church, where she choked on a hot-dog. Heimlich was administered prior to EMS arrival, pt was able to have something to drink afterwards but was stating that she feels like something was "lodged in her throat". During transfer to hospital bed pt burped loudly, after which she said she feels much better.

## 2016-06-18 ENCOUNTER — Telehealth (HOSPITAL_COMMUNITY): Payer: Self-pay

## 2016-06-18 ENCOUNTER — Encounter (HOSPITAL_COMMUNITY): Payer: Self-pay | Admitting: Psychiatry

## 2016-06-18 ENCOUNTER — Ambulatory Visit (INDEPENDENT_AMBULATORY_CARE_PROVIDER_SITE_OTHER): Payer: Medicare Other | Admitting: Psychiatry

## 2016-06-18 VITALS — BP 122/72 | HR 105 | Ht 63.5 in | Wt 179.2 lb

## 2016-06-18 DIAGNOSIS — F331 Major depressive disorder, recurrent, moderate: Secondary | ICD-10-CM

## 2016-06-18 DIAGNOSIS — F330 Major depressive disorder, recurrent, mild: Secondary | ICD-10-CM | POA: Diagnosis not present

## 2016-06-18 DIAGNOSIS — F4310 Post-traumatic stress disorder, unspecified: Secondary | ICD-10-CM | POA: Diagnosis not present

## 2016-06-18 MED ORDER — QUETIAPINE FUMARATE 400 MG PO TABS
400.0000 mg | ORAL_TABLET | Freq: Every day | ORAL | Status: DC
Start: 2016-06-18 — End: 2016-09-18

## 2016-06-18 MED ORDER — CITALOPRAM HYDROBROMIDE 20 MG PO TABS: 20.0000 mg | ORAL_TABLET | Freq: Every day | ORAL | Status: DC

## 2016-06-18 MED ORDER — PREGABALIN 25 MG PO CAPS: 50.0000 mg | ORAL_CAPSULE | Freq: Every day | ORAL | Status: DC

## 2016-06-18 MED ORDER — CLONAZEPAM 0.5 MG PO TABS: 0.5000 mg | ORAL_TABLET | Freq: Every day | ORAL | Status: DC

## 2016-06-18 MED ORDER — MIRTAZAPINE 30 MG PO TABS: 30.0000 mg | ORAL_TABLET | Freq: Every day | ORAL | Status: DC
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
## 2016-07-09 ENCOUNTER — Ambulatory Visit (HOSPITAL_COMMUNITY): Payer: Self-pay | Admitting: Psychiatry

## 2016-07-23 ENCOUNTER — Telehealth (HOSPITAL_COMMUNITY): Payer: Self-pay
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
## 2016-08-02 ENCOUNTER — Other Ambulatory Visit (HOSPITAL_COMMUNITY): Payer: Self-pay

## 2016-08-02 MED ORDER — PREGABALIN 75 MG PO CAPS: 75.0000 mg | ORAL_CAPSULE | Freq: Every day | ORAL | 0 refills | Status: DC

## 2016-08-02 NOTE — Telephone Encounter (Signed)
I spoke to Dr. Doyne Keel about this one since we found out that Dr. Adele Schilder would be out a little longer. Dr. Doyne Keel said it would be okay to increase. I called the patient to let her know and she states that she still has 2 bottles of the 25 mg and she will just increase her dose to 3 a day and then call me when she was running low for a hardcopy prescription to send to manufacturer.

## 2016-08-09 DIAGNOSIS — R350 Frequency of micturition: Secondary | ICD-10-CM | POA: Diagnosis not present

## 2016-08-24 ENCOUNTER — Other Ambulatory Visit (HOSPITAL_COMMUNITY): Payer: Self-pay

## 2016-08-24 MED ORDER — DOXEPIN HCL 25 MG PO CAPS: 25.0000 mg | ORAL_CAPSULE | Freq: Every day | ORAL | 0 refills | Status: DC
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
## 2016-08-29 ENCOUNTER — Telehealth (HOSPITAL_COMMUNITY): Payer: Self-pay

## 2016-08-29 NOTE — Telephone Encounter (Signed)
Patient called today and states that she is still not sleeping, the last time she called Dr. Casimiro Needle prescribed Doxepin 25 mg 1 po qhs. Patient is currently taking Klonopin 0.5 mg, Seroquel 400 mg, Remeron 30 mg, and doxepin 25 mg all at bedtime. Patient states that yes, she is taking all of these but is still only sleeping about 2-3 hours a night. Please review and advise, thank you

## 2016-08-31 ENCOUNTER — Other Ambulatory Visit: Payer: Self-pay | Admitting: Physician Assistant

## 2016-08-31 DIAGNOSIS — R1310 Dysphagia, unspecified: Secondary | ICD-10-CM

## 2016-08-31 DIAGNOSIS — Z1211 Encounter for screening for malignant neoplasm of colon: Secondary | ICD-10-CM | POA: Diagnosis not present

## 2016-08-31 DIAGNOSIS — K219 Gastro-esophageal reflux disease without esophagitis: Secondary | ICD-10-CM | POA: Diagnosis not present

## 2016-08-31 DIAGNOSIS — R1314 Dysphagia, pharyngoesophageal phase: Secondary | ICD-10-CM | POA: Diagnosis not present

## 2016-08-31 DIAGNOSIS — R1111 Vomiting without nausea: Secondary | ICD-10-CM | POA: Diagnosis not present

## 2016-08-31 DIAGNOSIS — R1319 Other dysphagia: Secondary | ICD-10-CM

## 2016-09-04 ENCOUNTER — Ambulatory Visit
Admission: RE | Admit: 2016-09-04 | Discharge: 2016-09-04 | Disposition: A | Discharge status: Home / Self Care | Payer: Medicare Other | Source: Ambulatory Visit | Attending: Physician Assistant | Admitting: Physician Assistant

## 2016-09-04 DIAGNOSIS — K219 Gastro-esophageal reflux disease without esophagitis: Secondary | ICD-10-CM | POA: Diagnosis not present

## 2016-09-04 DIAGNOSIS — R1310 Dysphagia, unspecified: Secondary | ICD-10-CM | POA: Diagnosis not present

## 2016-09-04 DIAGNOSIS — R1319 Other dysphagia: Secondary | ICD-10-CM

## 2016-09-05 ENCOUNTER — Other Ambulatory Visit: Payer: Self-pay

## 2016-09-06 ENCOUNTER — Telehealth (HOSPITAL_COMMUNITY): Payer: Self-pay

## 2016-09-06 DIAGNOSIS — K224 Dyskinesia of esophagus: Secondary | ICD-10-CM | POA: Diagnosis not present

## 2016-09-06 DIAGNOSIS — R933 Abnormal findings on diagnostic imaging of other parts of digestive tract: Secondary | ICD-10-CM | POA: Diagnosis not present

## 2016-09-06 DIAGNOSIS — R1310 Dysphagia, unspecified: Secondary | ICD-10-CM | POA: Diagnosis not present

## 2016-09-06 DIAGNOSIS — D124 Benign neoplasm of descending colon: Secondary | ICD-10-CM | POA: Diagnosis not present

## 2016-09-06 DIAGNOSIS — K648 Other hemorrhoids: Secondary | ICD-10-CM | POA: Diagnosis not present

## 2016-09-06 DIAGNOSIS — D123 Benign neoplasm of transverse colon: Secondary | ICD-10-CM | POA: Diagnosis not present

## 2016-09-06 DIAGNOSIS — D126 Benign neoplasm of colon, unspecified: Secondary | ICD-10-CM | POA: Diagnosis not present

## 2016-09-06 DIAGNOSIS — Z1211 Encounter for screening for malignant neoplasm of colon: Secondary | ICD-10-CM | POA: Diagnosis not present

## 2016-09-06 DIAGNOSIS — K5730 Diverticulosis of large intestine without perforation or abscess without bleeding: Secondary | ICD-10-CM | POA: Diagnosis not present

## 2016-09-06 NOTE — Telephone Encounter (Signed)
Spoke to Dr. Adele Schilder and he stated he would talk to her about this at her next appointment. I called the patient and lvm letting her know what he said. Patient was agreeable to this
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
## 2016-09-11 DIAGNOSIS — D126 Benign neoplasm of colon, unspecified: Secondary | ICD-10-CM | POA: Diagnosis not present

## 2016-09-11 DIAGNOSIS — Z1211 Encounter for screening for malignant neoplasm of colon: Secondary | ICD-10-CM | POA: Diagnosis not present

## 2016-09-13 DIAGNOSIS — Z23 Encounter for immunization: Secondary | ICD-10-CM | POA: Diagnosis not present

## 2016-09-14 DIAGNOSIS — H8110 Benign paroxysmal vertigo, unspecified ear: Secondary | ICD-10-CM | POA: Diagnosis not present

## 2016-09-14 DIAGNOSIS — H6981 Other specified disorders of Eustachian tube, right ear: Secondary | ICD-10-CM | POA: Diagnosis not present

## 2016-09-14 DIAGNOSIS — H6122 Impacted cerumen, left ear: Secondary | ICD-10-CM | POA: Diagnosis not present

## 2016-09-18 ENCOUNTER — Encounter (HOSPITAL_COMMUNITY): Payer: Self-pay | Admitting: Psychiatry

## 2016-09-18 ENCOUNTER — Ambulatory Visit (INDEPENDENT_AMBULATORY_CARE_PROVIDER_SITE_OTHER): Payer: Medicare Other | Admitting: Psychiatry

## 2016-09-18 ENCOUNTER — Encounter (HOSPITAL_COMMUNITY): Payer: Self-pay

## 2016-09-18 DIAGNOSIS — G3184 Mild cognitive impairment, so stated: Secondary | ICD-10-CM

## 2016-09-18 DIAGNOSIS — Z79899 Other long term (current) drug therapy: Secondary | ICD-10-CM

## 2016-09-18 DIAGNOSIS — F331 Major depressive disorder, recurrent, moderate: Secondary | ICD-10-CM | POA: Diagnosis not present

## 2016-09-18 DIAGNOSIS — F330 Major depressive disorder, recurrent, mild: Secondary | ICD-10-CM

## 2016-09-18 DIAGNOSIS — F4310 Post-traumatic stress disorder, unspecified: Secondary | ICD-10-CM | POA: Diagnosis not present

## 2016-09-18 MED ORDER — QUETIAPINE FUMARATE 400 MG PO TABS: 400.0000 mg | ORAL_TABLET | Freq: Every day | ORAL | 2 refills | Status: DC

## 2016-09-18 MED ORDER — DOXEPIN HCL 25 MG PO CAPS: 25.0000 mg | ORAL_CAPSULE | Freq: Every day | ORAL | 2 refills | Status: DC

## 2016-09-18 MED ORDER — MIRTAZAPINE 30 MG PO TABS: 30.0000 mg | ORAL_TABLET | Freq: Every day | ORAL | 2 refills | Status: DC

## 2016-09-18 MED ORDER — CLONAZEPAM 0.5 MG PO TABS: 0.5000 mg | ORAL_TABLET | Freq: Every day | ORAL | 2 refills | Status: DC

## 2016-09-18 MED ORDER — PREGABALIN 75 MG PO CAPS: 75.0000 mg | ORAL_CAPSULE | Freq: Every day | ORAL | 0 refills | Status: DC

## 2016-09-18 MED ORDER — CITALOPRAM HYDROBROMIDE 40 MG PO TABS: 40.0000 mg | ORAL_TABLET | Freq: Every day | ORAL | 2 refills | Status: DC
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
## 2016-09-20 ENCOUNTER — Other Ambulatory Visit: Payer: Self-pay | Admitting: Family Medicine

## 2016-09-20 ENCOUNTER — Ambulatory Visit
Admission: RE | Admit: 2016-09-20 | Discharge: 2016-09-20 | Disposition: A | Discharge status: Home / Self Care | Payer: Medicare Other | Source: Ambulatory Visit | Attending: Family Medicine | Admitting: Family Medicine

## 2016-09-20 DIAGNOSIS — Z1231 Encounter for screening mammogram for malignant neoplasm of breast: Secondary | ICD-10-CM | POA: Diagnosis not present

## 2016-09-20 DIAGNOSIS — Z803 Family history of malignant neoplasm of breast: Secondary | ICD-10-CM

## 2016-09-26 ENCOUNTER — Ambulatory Visit (HOSPITAL_COMMUNITY)
Admission: RE | Admit: 2016-09-26 | Discharge: 2016-09-26 | Disposition: A | Discharge status: Home / Self Care | Payer: Medicare Other | Source: Ambulatory Visit | Attending: Gastroenterology | Admitting: Gastroenterology

## 2016-09-26 ENCOUNTER — Encounter (HOSPITAL_COMMUNITY)
Admission: RE | Disposition: A | Discharge status: Home / Self Care | Payer: Self-pay | Source: Ambulatory Visit | Attending: Gastroenterology

## 2016-09-26 DIAGNOSIS — K219 Gastro-esophageal reflux disease without esophagitis: Secondary | ICD-10-CM | POA: Diagnosis not present

## 2016-09-26 DIAGNOSIS — R1310 Dysphagia, unspecified: Secondary | ICD-10-CM | POA: Insufficient documentation

## 2016-09-26 DIAGNOSIS — R1314 Dysphagia, pharyngoesophageal phase: Secondary | ICD-10-CM | POA: Diagnosis not present

## 2016-09-26 HISTORY — PX: ESOPHAGEAL MANOMETRY: SHX5429

## 2016-09-26 SURGERY — MANOMETRY, ESOPHAGUS

## 2016-09-26 MED ORDER — LIDOCAINE VISCOUS 2 % MT SOLN
OROMUCOSAL | Status: AC
Start: 2016-09-26 — End: 2016-09-26
  Filled 2016-09-26: qty 15

## 2016-09-26 SURGICAL SUPPLY — 2 items
FACESHIELD LNG OPTICON STERILE (SAFETY) IMPLANT
GLOVE BIO SURGEON STRL SZ8 (GLOVE) ×4 IMPLANT

## 2016-09-27 ENCOUNTER — Other Ambulatory Visit (HOSPITAL_COMMUNITY): Payer: Self-pay | Admitting: Psychiatry

## 2016-09-27 DIAGNOSIS — F331 Major depressive disorder, recurrent, moderate: Secondary | ICD-10-CM

## 2016-09-27 DIAGNOSIS — F330 Major depressive disorder, recurrent, mild: Secondary | ICD-10-CM

## 2016-09-28 ENCOUNTER — Encounter (HOSPITAL_COMMUNITY): Payer: Self-pay | Admitting: Gastroenterology

## 2016-11-21 ENCOUNTER — Telehealth (INDEPENDENT_AMBULATORY_CARE_PROVIDER_SITE_OTHER): Payer: Self-pay | Admitting: *Deleted

## 2016-11-21 NOTE — Telephone Encounter (Signed)
Pt called stating she hurt her shoulder. Pt is taking anti inflammatory already. I offered to make pt an appt but she wanted to speak to someone about this to see if she needed to be seen.

## 2016-11-27 NOTE — Telephone Encounter (Signed)
I have tried calling patient multiple times. Either it is busy or no voicemail is set up

## 2016-12-19 ENCOUNTER — Other Ambulatory Visit (HOSPITAL_COMMUNITY): Payer: Self-pay

## 2016-12-19 MED ORDER — DOXEPIN HCL 25 MG PO CAPS: 25.0000 mg | ORAL_CAPSULE | Freq: Every day | ORAL | 0 refills | Status: DC

## 2016-12-24 ENCOUNTER — Ambulatory Visit (HOSPITAL_COMMUNITY): Payer: Self-pay | Admitting: Psychiatry

## 2016-12-26 ENCOUNTER — Ambulatory Visit (INDEPENDENT_AMBULATORY_CARE_PROVIDER_SITE_OTHER): Payer: Medicare Other | Admitting: Psychiatry

## 2016-12-26 ENCOUNTER — Encounter (HOSPITAL_COMMUNITY): Payer: Self-pay | Admitting: Psychiatry

## 2016-12-26 VITALS — BP 118/68 | HR 92 | Ht 64.0 in | Wt 191.2 lb

## 2016-12-26 DIAGNOSIS — F331 Major depressive disorder, recurrent, moderate: Secondary | ICD-10-CM

## 2016-12-26 DIAGNOSIS — F330 Major depressive disorder, recurrent, mild: Secondary | ICD-10-CM | POA: Diagnosis not present

## 2016-12-26 DIAGNOSIS — Z833 Family history of diabetes mellitus: Secondary | ICD-10-CM

## 2016-12-26 DIAGNOSIS — Z79899 Other long term (current) drug therapy: Secondary | ICD-10-CM

## 2016-12-26 DIAGNOSIS — Z811 Family history of alcohol abuse and dependence: Secondary | ICD-10-CM

## 2016-12-26 MED ORDER — CITALOPRAM HYDROBROMIDE 40 MG PO TABS: 40.0000 mg | ORAL_TABLET | Freq: Every day | ORAL | 2 refills | Status: DC

## 2016-12-26 MED ORDER — DOXEPIN HCL 25 MG PO CAPS: 25.0000 mg | ORAL_CAPSULE | Freq: Every day | ORAL | 2 refills | Status: DC

## 2016-12-26 MED ORDER — QUETIAPINE FUMARATE 400 MG PO TABS: 400.0000 mg | ORAL_TABLET | Freq: Every day | ORAL | 2 refills | Status: DC

## 2016-12-26 MED ORDER — PREGABALIN 75 MG PO CAPS: 75.0000 mg | ORAL_CAPSULE | Freq: Every day | ORAL | 0 refills | Status: DC

## 2016-12-26 MED ORDER — MIRTAZAPINE 15 MG PO TABS: 15.0000 mg | ORAL_TABLET | Freq: Every day | ORAL | 2 refills | Status: DC

## 2016-12-26 MED ORDER — CLONAZEPAM 0.5 MG PO TABS: 0.5000 mg | ORAL_TABLET | Freq: Every day | ORAL | 2 refills | Status: DC
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
## 2017-01-10 ENCOUNTER — Telehealth (INDEPENDENT_AMBULATORY_CARE_PROVIDER_SITE_OTHER): Payer: Self-pay | Admitting: *Deleted

## 2017-01-10 ENCOUNTER — Other Ambulatory Visit (INDEPENDENT_AMBULATORY_CARE_PROVIDER_SITE_OTHER): Payer: Self-pay

## 2017-01-10 MED ORDER — MELOXICAM 7.5 MG PO TABS: 7.5000 mg | ORAL_TABLET | Freq: Two times a day (BID) | ORAL | 3 refills | Status: DC

## 2017-01-10 NOTE — Telephone Encounter (Signed)
Please advise 

## 2017-01-10 NOTE — Telephone Encounter (Signed)
Ok for meloxicam 7.5 mg po twice daily prn pain and inflammation, #60, 3 refills

## 2017-01-10 NOTE — Telephone Encounter (Signed)
Patient came in this morning wanting to know if Dr. Ninfa Linden would possibly send in a prescription for her Meloxicam 7.5mg  please. Her CB # [phone]. Thank you

## 2017-01-10 NOTE — Telephone Encounter (Signed)
Faxed Rx into pharmacy

## 2017-01-16 ENCOUNTER — Telehealth: Payer: Self-pay | Admitting: *Deleted

## 2017-01-16 NOTE — Telephone Encounter (Signed)
Patient left a message stating she is a patient's of Dr. Pryor Montes and I having increased popping and cracking in her left knee. Patient is requesting to be worked in for an appointment tomorrow afternoon. She can be reached at [phone].

## 2017-01-16 NOTE — Telephone Encounter (Signed)
Can we open a spot for her?

## 2017-01-16 NOTE — Telephone Encounter (Signed)
Dr. Blackman patient 

## 2017-01-17 ENCOUNTER — Ambulatory Visit (INDEPENDENT_AMBULATORY_CARE_PROVIDER_SITE_OTHER): Payer: Medicare Other

## 2017-01-17 ENCOUNTER — Ambulatory Visit (INDEPENDENT_AMBULATORY_CARE_PROVIDER_SITE_OTHER): Payer: Medicare Other | Admitting: Orthopaedic Surgery

## 2017-01-17 DIAGNOSIS — M25562 Pain in left knee: Secondary | ICD-10-CM

## 2017-01-17 DIAGNOSIS — G8929 Other chronic pain: Secondary | ICD-10-CM | POA: Diagnosis not present

## 2017-01-17 MED ORDER — METHYLPREDNISOLONE ACETATE 40 MG/ML IJ SUSP
40.0000 mg | INTRAMUSCULAR | Status: AC | PRN
  Administered 2017-01-17: 40 mg via INTRA_ARTICULAR

## 2017-01-17 MED ORDER — DICLOFENAC SODIUM 1 % TD GEL: 2.0000 g | Freq: Four times a day (QID) | TRANSDERMAL | 3 refills | Status: DC

## 2017-01-17 MED ORDER — LIDOCAINE HCL 1 % IJ SOLN
3.0000 mL | INTRAMUSCULAR | Status: AC | PRN
  Administered 2017-01-17: 3 mL

## 2017-01-17 NOTE — Progress Notes (Signed)
Office Visit Note   Patient: Kendra Simmons           Date of Birth: 1951-03-31           MRN: [id number] Visit Date: 01/17/2017              Requested by: Donald Prose, MD [place], [place] [address] PCP: [name], MD   Assessment & Plan: Visit Diagnoses:  1. Chronic pain of left knee     Plan: He tolerated the steroid injection well and her left knee. She is a perfect candidate for hyaluronic acid again. This is an excellent idea and she agrees with this. We'll see her back in 4 weeks and hopefully will have improved hyaluronic acid a place in her left knee.  Follow-Up Instructions: Return in about 4 weeks (around 02/14/2017).   Orders:  Orders Placed This Encounter  Procedures  . Large Joint Injection/Arthrocentesis  . Large Joint Injection/Arthrocentesis  . XR Knee 1-2 Views Left   Meds ordered this encounter  Medications  . diclofenac sodium (VOLTAREN) 1 % GEL    Sig: Apply 2 g topically 4 (four) times daily.    Dispense:  100 g    Refill:  3      Procedures: Large Joint Inj Date/Time: 01/17/2017 3:48 PM Performed by: Mcarthur Rossetti Authorized by: Mcarthur Rossetti   Location:  Knee Site:  L knee Ultrasound Guidance: No   Fluoroscopic Guidance: No   Arthrogram: No   Medications:  3 mL lidocaine 1 %; 40 mg methylPREDNISolone acetate 40 MG/ML     Clinical Data: No additional findings.   Subjective: No chief complaint on file. The patient is well-known to me. She is a 66 year old female with known arthritis in her right knee. She is a family medical history of both her mother and sister having knee replacements. She said her knee does pop and crack and is painful to her. She's had steroid injections in the past and hyaluronic acid years ago and this did help. She is requesting a steroid injection in her left knee today and we will get new x-rays of her knee since we have and have these in over 3 years. She points  the medial joint line mainly as a source of her pain as well as in the patellofemoral joint. 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 She said that is helped with her aches and pains in general is well. She takes meloxicam and occasional topical anti-inflammatories well.  HPI  Review of Systems She is alert and oriented 3 and in no acute distress. She denies any chest pain, headache, shortness of breath, fever, chills, nausea, vomiting.  Objective: Vital Signs: There were no vitals taken for this visit.  Physical Exam She walks with a slight limp. She is again alert and oriented 3. She is in no acute distress. Ortho Exam In addition of her left knee today shows no effusion. There is medial joint line tenderness and a slight varus malalignment. She has excellent range of motion of the knee does feel ligamentously stable. Specialty Comments:  No specialty comments available.  Imaging: Xr Knee 1-2 Views Left  Result Date: 01/17/2017 An AP and lateral of her left knee shows a slight varus malalignment. She has moderate tricompartmental arthritic changes.    PMFS History: Patient Active Problem List   Diagnosis Date Noted  . Dehydration 07/03/2015  . UTI (lower urinary tract  infection) 07/03/2015  . Hypokalemia 07/03/2015  . AKI (acute kidney injury) ([place]) 07/03/2015  . Tachycardia 05/27/2015  . MDD (major depressive disorder), recurrent, severe, with psychosis ([place]) 05/25/2015  . 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 Insomnia disorder, with non-sleep disorder mental comorbidity, 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  . 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 Snoring 12/24/2014  . Unintended weight loss 12/24/2014  . 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 Panic attacks 07/26/2014  . Thrombocytopenia ([place]) 12/17/2012  . DYSLIPIDEMIA 03/13/2010  . GERD 03/13/2010   Past Medical History:  Diagnosis Date  . Anxiety   . Arthritis   . Depression   . Emotional  depression 02/04/2015  . GERD (gastroesophageal reflux disease)   . High cholesterol   . Insomnia   . Memory loss     Family History  Problem Relation Age of Onset  . Sleep apnea Father   . Alcohol abuse Father   . Diabetes Mother   . Diabetes Sister   . Diabetes Maternal Uncle   . Diabetes Cousin     Past Surgical History:  Procedure Laterality Date  . CESAREAN SECTION    . COSMETIC SURGERY    . ESOPHAGEAL MANOMETRY N/A 09/26/2016   Procedure: ESOPHAGEAL MANOMETRY (EM);  Surgeon: Clarene Essex, MD;  Location: [facility];  Service: Endoscopy;  Laterality: N/A;  . laproscopy     Social History   Occupational History  . Retired    Social History Main Topics  . Smoking status: Never Smoker  . Smokeless tobacco: Never Used  . Alcohol use No     Comment: zero  . Drug use: No     Comment: Denies any drug use other than benzos  . Sexual activity: No

## 2017-01-18 ENCOUNTER — Other Ambulatory Visit (INDEPENDENT_AMBULATORY_CARE_PROVIDER_SITE_OTHER): Payer: Self-pay

## 2017-02-14 ENCOUNTER — Ambulatory Visit (INDEPENDENT_AMBULATORY_CARE_PROVIDER_SITE_OTHER): Payer: Medicare Other | Admitting: Orthopaedic Surgery

## 2017-02-20 ENCOUNTER — Ambulatory Visit (INDEPENDENT_AMBULATORY_CARE_PROVIDER_SITE_OTHER): Payer: Medicare Other | Admitting: Physician Assistant

## 2017-02-20 DIAGNOSIS — M1712 Unilateral primary osteoarthritis, left knee: Secondary | ICD-10-CM | POA: Diagnosis not present

## 2017-02-20 DIAGNOSIS — M545 Low back pain, unspecified: Secondary | ICD-10-CM

## 2017-02-20 MED ORDER — HYALURONAN 88 MG/4ML IX SOSY
88.0000 mg | PREFILLED_SYRINGE | INTRA_ARTICULAR | Status: AC | PRN
  Administered 2017-02-20: 88 mg via INTRA_ARTICULAR

## 2017-02-20 MED ORDER — METHYLPREDNISOLONE 4 MG PO TABS: 4.0000 mg | ORAL_TABLET | Freq: Every day | ORAL | 0 refills | Status: DC

## 2017-02-20 NOTE — Progress Notes (Signed)
Office Visit Note   Patient: Kendra Simmons           Date of Birth: 1951-02-21           MRN: [id number] Visit Date: 02/20/2017              Requested by: Donald Prose, MD [place],  [id number] PCP: [name], MD   Assessment & Plan: Visit Diagnoses:  1. Unilateral primary osteoarthritis, left knee   2. Left-sided low back pain without sciatica, unspecified chronicity     Plan: We'll see her back in 6 weeks' check progress lack of regards to the left knee with Synvisc one injection. In regards to her low back pain that she's having no radicular symptoms therefore will have her do back exercises handouts given with back exercises and these are discussed with the patient today. Moist deep to the back. We'll place her on a Medrol Dosepak no NSAIDs while on the Dosepak  Follow-Up Instructions: Return in about 6 weeks (around 04/03/2017).   Orders:  Orders Placed This Encounter  Procedures  . Large Joint Injection/Arthrocentesis   Meds ordered this encounter  Medications  . methylPREDNISolone (MEDROL) 4 MG tablet    Sig: Take 1 tablet (4 mg total) by mouth daily.    Dispense:  21 tablet    Refill:  0      Procedures: Large Joint Inj Date/Time: 02/20/2017 10:49 AM Performed by: Pete Pelt Authorized by: Pete Pelt   Consent Given by:  Patient Indications:  Pain Location:  Knee Site:  L knee Needle Size:  22 G Approach:  Anterolateral Ultrasound Guidance: No   Fluoroscopic Guidance: No   Medications:  88 mg Hyaluronan 88 MG/4ML Aspiration Attempted: No   Patient tolerance:  Patient tolerated the procedure well with no immediate complications     Clinical Data: No additional findings.   Subjective: Left knee pain Low back pain without radicular symptoms  HPI [name] was in today for a left knee mono disc injection. He has known osteoarthritis left knee. She's also requesting a handicap placard. She's  having some low back pain without radicular symptoms down either leg. She's tried moist heat. We did see her last spring for similar low back pain lumbar from spine films showed just some straightening of the lumbar spine recommended physical therapy she did not go to physical therapy. States that the back pain began to 3 weeks ago no known injury. Review of Systems No radicular symptoms down either leg. No fevers or chills.  Objective: Vital Signs: There were no vitals taken for this visit.  Physical Exam Well-developed well-nourished female in no acute distress. Mood and affect Appropriate. Psychiatric alert 3. Ortho Exam Lower extremities 5 out of 5 strengths throughout the lower shin is against resistance negative straight leg raise bilaterally. She is able to touch her toes. She has good extension and lumbar spine without pain. No tenderness over the spinal column she has tenderness in the lower lumbar paraspinous region left greater than right. Left knee crepitus patellar region with passive range of motion. Knee is ligamentously stable. No effusion no abnormal warmth no erythema Specialty Comments:  No specialty comments available.  Imaging: No results found.   PMFS History: Patient Active Problem List   Diagnosis Date Noted  . Dehydration 07/03/2015  . UTI (lower urinary tract infection) 07/03/2015  . Hypokalemia 07/03/2015  . AKI (acute kidney injury) ([place]) 07/03/2015  . Tachycardia  05/27/2015  . MDD (major depressive disorder), recurrent, severe, with psychosis ([place]) 05/25/2015  . 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 Insomnia disorder, with non-sleep disorder mental comorbidity, 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  . 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 Snoring 12/24/2014  . Unintended weight loss 12/24/2014  . 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 Panic attacks 07/26/2014  . Thrombocytopenia ([place]) 12/17/2012  . DYSLIPIDEMIA 03/13/2010  . GERD 03/13/2010   Past Medical History:    Diagnosis Date  . Anxiety   . Arthritis   . Depression   . Emotional depression 02/04/2015  . GERD (gastroesophageal reflux disease)   . High cholesterol   . Insomnia   . Memory loss     Family History  Problem Relation Age of Onset  . Sleep apnea Father   . Alcohol abuse Father   . Diabetes Mother   . Diabetes Sister   . Diabetes Maternal Uncle   . Diabetes Cousin     Past Surgical History:  Procedure Laterality Date  . CESAREAN SECTION    . COSMETIC SURGERY    . ESOPHAGEAL MANOMETRY N/A 09/26/2016   Procedure: ESOPHAGEAL MANOMETRY (EM);  Surgeon: Clarene Essex, MD;  Location: [facility];  Service: Endoscopy;  Laterality: N/A;  . laproscopy     Social History   Occupational History  . Retired    Social History Main Topics  . Smoking status: Never Smoker  . Smokeless tobacco: Never Used  . Alcohol use No     Comment: zero  . Drug use: No     Comment: Denies any drug use other than benzos  . Sexual activity: No

## 2017-03-22 ENCOUNTER — Other Ambulatory Visit (HOSPITAL_COMMUNITY): Payer: Self-pay | Admitting: Psychiatry

## 2017-03-22 DIAGNOSIS — F330 Major depressive disorder, recurrent, mild: Secondary | ICD-10-CM

## 2017-03-27 ENCOUNTER — Encounter (HOSPITAL_COMMUNITY): Payer: Self-pay | Admitting: Psychiatry

## 2017-03-27 ENCOUNTER — Other Ambulatory Visit (HOSPITAL_COMMUNITY): Payer: Self-pay | Admitting: Psychiatry

## 2017-03-27 ENCOUNTER — Ambulatory Visit (INDEPENDENT_AMBULATORY_CARE_PROVIDER_SITE_OTHER): Payer: Medicare Other | Admitting: Psychiatry

## 2017-03-27 VITALS — BP 132/70 | HR 88 | Ht 64.0 in | Wt 201.2 lb

## 2017-03-27 DIAGNOSIS — Z79899 Other long term (current) drug therapy: Secondary | ICD-10-CM

## 2017-03-27 DIAGNOSIS — Z811 Family history of alcohol abuse and dependence: Secondary | ICD-10-CM

## 2017-03-27 DIAGNOSIS — F330 Major depressive disorder, recurrent, mild: Secondary | ICD-10-CM

## 2017-03-27 MED ORDER — PREGABALIN 75 MG PO CAPS: 75.0000 mg | ORAL_CAPSULE | Freq: Every day | ORAL | 0 refills | Status: DC

## 2017-03-27 MED ORDER — NALTREXONE-BUPROPION HCL ER 8-90 MG PO TB12: 8.0000 mg | ORAL_TABLET | Freq: Every morning | ORAL | 1 refills | Status: DC

## 2017-03-27 MED ORDER — CLONAZEPAM 0.5 MG PO TABS: 0.5000 mg | ORAL_TABLET | Freq: Every day | ORAL | 1 refills | Status: DC

## 2017-03-27 MED ORDER — CITALOPRAM HYDROBROMIDE 20 MG PO TABS: 20.0000 mg | ORAL_TABLET | Freq: Every day | ORAL | 1 refills | Status: DC

## 2017-03-27 MED ORDER — DOXEPIN HCL 25 MG PO CAPS: 25.0000 mg | ORAL_CAPSULE | Freq: Every day | ORAL | 1 refills | Status: DC

## 2017-03-27 NOTE — Progress Notes (Signed)
BH MD/PA/NP OP Progress Note  03/27/2017 10:49 AM Kendra Simmons  MRN:  [id number]  Chief Complaint:  Subjective:  I'm happy.  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  I love my job.  HPI: Kendra Simmons came for her follow-up appointment.  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  She really enjoys her work.  However she is still very disappointed from her family member who do not want to indicate with her.  She admitted this appointment and discourage but she wants to move on.  She is hoping to have a visit [place] in May.  She like to go by herself.  She continued to have concern about her obesity.  On her last visit we cut down the Remeron but she continues to gain weight.  She admitted that she continues to drink Coke which she cannot quit but has cut down sweets.  She notices energy level is fair.  She denies any irritability, anger, mania or any psychosis.  She is afraid cutting further medication because she does not want to relapse and especially insomnia.  She is very reluctant to stop the Lyrica and doxepin which is helping her sleep.  She is still taking multiple medication for her mental illness.  She is finally getting better and able to work.  She has no nightmares, flashback.  She has no tremors or shakes.  She denies any crying spells or any feeling of hopelessness or worthlessness.  Her tingling and neuropathy pain is improved by Lyrica.  She denies any auditory or visual hallucination.  She lives by herself.  She like to try contrave for weight loss.  Patient denies drinking alcohol or using any illegal substances.  Visit Diagnosis:    ICD-9-CM ICD-10-CM   1. Major depressive disorder, recurrent episode, mild (HCC) 296.31 F33.0 citalopram (CELEXA) 20 MG tablet     doxepin (SINEQUAN) 25 MG capsule     clonazePAM (KLONOPIN) 0.5 MG tablet     pregabalin (LYRICA) 75 MG capsule  2. Morbid obesity ([place])  278.01 E66.01 Naltrexone-Bupropion HCl ER (CONTRAVE) 8-90 MG TB12    Past Psychiatric History: Reviewed. Patient has multiple hospitalization.  Her last hospitalization was in June 2016.  She has done multiple intensive outpatient program.  She has history of hallucination, paranoia, severe depression.  In the past she had seen [name], [name] and Dr. Thurmond Butts.  In the past she has taken Xanax, amitriptyline, imipramine, temazepam, trazodone, Zyprexa, Luvox, Wellbutrin, Prozac, Vistaril and Bellsomra.  Past Medical History:  Past Medical History:  Diagnosis Date  . Anxiety   . Arthritis   . Depression   . Emotional depression 02/04/2015  . GERD (gastroesophageal reflux disease)   . High cholesterol   . Insomnia   . Memory loss     Past Surgical History:  Procedure Laterality Date  . CESAREAN SECTION    . COSMETIC SURGERY    . ESOPHAGEAL MANOMETRY N/A 09/26/2016   Procedure: ESOPHAGEAL MANOMETRY (EM);  Surgeon: Clarene Essex, MD;  Location: [facility];  Service: Endoscopy;  Laterality: N/A;  . laproscopy      Family Psychiatric History: Reviewed  Family History:  Family History  Problem Relation Age of Onset  . Sleep apnea Father   . Alcohol abuse Father   . Diabetes Mother   . Diabetes Sister   . Diabetes Maternal Uncle   . Diabetes Cousin  Social History:  Social History   Social History  . Marital status: Divorced    Spouse name: N/A  . Number of children: 3  . Years of education: 14   Occupational History  . Retired    Social History Main Topics  . Smoking status: Never Smoker  . Smokeless tobacco: Never Used  . Alcohol use No     Comment: zero  . Drug use: No     Comment: Denies any drug use other than benzos  . Sexual activity: No   Other Topics Concern  . None   Social History Narrative   Patient lives at home alone.   Caffeine Use:  2 cokes daily   Sister lives next door.    Allergies: No Known Allergies  Metabolic Disorder  Labs: Lab Results  Component Value Date   HGBA1C 5.8 (H) 11/04/2014   MPG 120 (H) 11/04/2014   MPG 134 (H) 07/13/2014   Lab Results  Component Value Date   PROLACTIN 50.3 (H) 05/25/2015   Lab Results  Component Value Date   CHOL 177 11/04/2014   TRIG 79 11/04/2014   HDL 45 11/04/2014   CHOLHDL 3.9 11/04/2014   VLDL 16 11/04/2014   LDLCALC 116 (H) 11/04/2014   LDLCALC 71 07/20/2014     Current Medications: Current Outpatient Prescriptions  Medication Sig Dispense Refill  . acetaminophen (TYLENOL) 500 MG tablet Take 1,000 mg by mouth every 6 (six) hours as needed for mild pain or headache.    . citalopram (CELEXA) 40 MG tablet Take 1 tablet (40 mg total) by mouth daily. 30 tablet 2  . clonazePAM (KLONOPIN) 0.5 MG tablet Take 1 tablet (0.5 mg total) by mouth at bedtime. 30 tablet 2  . CVS VITAMIN B-6 100 MG tablet 1 TABLET BY MOUTH TWICE DAILY  2  . diclofenac sodium (VOLTAREN) 1 % GEL Apply 2 g topically 4 (four) times daily. 100 g 3  . doxepin (SINEQUAN) 25 MG capsule Take 1 capsule (25 mg total) by mouth at bedtime. 30 capsule 2  . ibuprofen (ADVIL,MOTRIN) 200 MG tablet Take 400 mg by mouth 2 (two) times daily.    . meloxicam (MOBIC) 7.5 MG tablet Take 1 tablet (7.5 mg total) by mouth 2 (two) times daily. 60 tablet 3  . memantine (NAMENDA) 10 MG tablet Take 1 tablet (10 mg total) by mouth 2 (two) times daily. 60 tablet 12  . methylPREDNISolone (MEDROL) 4 MG tablet Take 1 tablet (4 mg total) by mouth daily. 21 tablet 0  . mirtazapine (REMERON) 15 MG tablet Take 1 tablet (15 mg total) by mouth at bedtime. 30 tablet 2  . omeprazole (PRILOSEC) 40 MG capsule 40 mg.  3  . pravastatin (PRAVACHOL) 20 MG tablet Take 1 tablet (20 mg total) by mouth at bedtime. For high cholesterol    . pregabalin (LYRICA) 75 MG capsule Take 1 capsule (75 mg total) by mouth at bedtime. 90 capsule 0  . promethazine (PHENERGAN) 25 MG tablet 1 TABLET AS NEEDED EVERY 12 HRS ORALLY 7 DAYS  0  . QUEtiapine  (SEROQUEL) 400 MG tablet Take 1 tablet (400 mg total) by mouth at bedtime. 30 tablet 2  . tamsulosin (FLOMAX) 0.4 MG CAPS capsule Take 1 capsule (0.4 mg total) by mouth daily after supper. For frequent urination/urgency 15 capsule 0   No current facility-administered medications for this visit.     Neurologic: Headache: No Seizure: No Paresthesias: Yes  Musculoskeletal: Strength & Muscle Tone: within normal limits  Gait & Station: normal Patient leans: N/A  Psychiatric Specialty Exam: ROS  Blood pressure 132/70, pulse 88, height 5\' 4"  (1.626 m), weight 201 lb 3.2 oz (91.3 kg).Body mass index is 34.54 kg/m.  General Appearance: Casual  Eye Contact:  Good  Speech:  Clear and Coherent  Volume:  Normal  Mood:  Euthymic  Affect:  Appropriate  Thought Process:  Goal Directed  Orientation:  Full (Time, Place, and Person)  Thought Content: WDL and Logical   Suicidal Thoughts:  No  Homicidal Thoughts:  No  Memory:  Immediate;   Good Recent;   Good Remote;   Good  Judgement:  Good  Insight:  Good  Psychomotor Activity:  Normal  Concentration:  Concentration: Good and Attention Span: Good  Recall:  Good  Fund of Knowledge: Good  Language: Good  Akathisia:  No  Handed:  Right  AIMS (if indicated):  0  Assets:  Communication Skills Desire for Improvement Housing Resilience  ADL's:  Intact  Cognition: WNL  Sleep:  ok    Assessment: Major depressive disorder, recurrent.  Obesity  Plan: Discuss in length her medication and especially polypharmacy which may be contributing to weight gain.  She is reluctant to stopping doxepin, Lyrica and Seroquel.  We discussed stopping Remeron and cutting Celexa and we will try contrave to help weight loss.  I also encouraged her to stop the Coke and do regular exercise.  Patient had appointment to see her primary care physician next month.  I will discontinue Remeron and reduce Celexa 20 mg.  I will start contrave 8/90 to help weight loss.   She like to continue Klonopin 0.5 mg at bedtime, doxepin 25 mg at bedtime, Seroquel 400 mg at bedtime and Lyrica 75 mg which she is getting from patient assistance.  Discuss in limb medication side effects and benefits.  Recommended to call us back if she has any question or any concern.  Discuss safety plan that anytime having active suicidal thoughts or homicidal thoughts and she need to call 911 or go to the local emergency room.  Time spent 25 minutes.  More than 50% of the time spent in psychoeducation, counseling, coordination of care and long-term prognosis.  Follow-up in 6 weeks.    ARFEEN,SYED T., MD 03/27/2017, 10:49 AM

## 2017-03-28 ENCOUNTER — Other Ambulatory Visit (HOSPITAL_COMMUNITY): Payer: Self-pay | Admitting: Psychiatry

## 2017-04-15 ENCOUNTER — Other Ambulatory Visit (HOSPITAL_COMMUNITY): Payer: Self-pay

## 2017-04-15 DIAGNOSIS — F330 Major depressive disorder, recurrent, mild: Secondary | ICD-10-CM

## 2017-04-15 MED ORDER — PREGABALIN 75 MG PO CAPS: 75.0000 mg | ORAL_CAPSULE | Freq: Every day | ORAL | 0 refills | Status: DC

## 2017-05-08 ENCOUNTER — Ambulatory Visit (INDEPENDENT_AMBULATORY_CARE_PROVIDER_SITE_OTHER): Payer: Medicare Other | Admitting: Psychiatry

## 2017-05-08 ENCOUNTER — Encounter (HOSPITAL_COMMUNITY): Payer: Self-pay | Admitting: Psychiatry

## 2017-05-08 VITALS — BP 126/64 | HR 92 | Ht 64.5 in | Wt 204.0 lb

## 2017-05-08 DIAGNOSIS — F330 Major depressive disorder, recurrent, mild: Secondary | ICD-10-CM | POA: Diagnosis not present

## 2017-05-08 DIAGNOSIS — Z6834 Body mass index (BMI) 34.0-34.9, adult: Secondary | ICD-10-CM | POA: Diagnosis not present

## 2017-05-08 DIAGNOSIS — E669 Obesity, unspecified: Secondary | ICD-10-CM

## 2017-05-08 DIAGNOSIS — Z811 Family history of alcohol abuse and dependence: Secondary | ICD-10-CM | POA: Diagnosis not present

## 2017-05-08 MED ORDER — CLONAZEPAM 0.5 MG PO TABS: 0.5000 mg | ORAL_TABLET | Freq: Every day | ORAL | 2 refills | Status: DC

## 2017-05-08 MED ORDER — NALTREXONE-BUPROPION HCL ER 8-90 MG PO TB12: 8.0000 mg | ORAL_TABLET | Freq: Two times a day (BID) | ORAL | 2 refills | Status: DC

## 2017-05-08 MED ORDER — QUETIAPINE FUMARATE 400 MG PO TABS: 400.0000 mg | ORAL_TABLET | Freq: Every day | ORAL | 0 refills | Status: DC

## 2017-05-08 MED ORDER — PREGABALIN 75 MG PO CAPS: 75.0000 mg | ORAL_CAPSULE | Freq: Every day | ORAL | 0 refills | Status: DC

## 2017-05-08 MED ORDER — CITALOPRAM HYDROBROMIDE 20 MG PO TABS: 20.0000 mg | ORAL_TABLET | Freq: Every day | ORAL | 0 refills | Status: DC

## 2017-05-08 MED ORDER — CLONAZEPAM 0.5 MG PO TABS: 0.5000 mg | ORAL_TABLET | Freq: Every day | ORAL | 1 refills | Status: DC

## 2017-05-08 NOTE — Progress Notes (Signed)
BH MD/PA/NP OP Progress Note  05/08/2017 10:20 AM Kendra Simmons  MRN:  [id number]  Chief Complaint:  Chief Complaint    Follow-up     Subjective:  I am doing better but I still have difficulty losing weight.  HPI: Kendra Simmons came for her follow-up appointment.  She is taking her medication as prescribed.  She is happy that she is not working 30 hours a week.  However she is not happy because she still struggle to lose weight.  We started Contrave last visit but she did not see any improvement.  She is tolerating medication and reported no side effects.  She is wondering if the dose can further increase.  She is no longer taking the Remeron but is still taking Celexa 20 mg.  She is very reluctant to cut down her Lyrica Seroquel and Sinequan.  She reported her sleep is good and she does not want to change any medication that causes insomnia.  She is still very frustrated and disappointed with her sister and family member.  Today she is going to see a lawyer because she signed her land papers to her sister last year and now she is thinking to get it back.  Patient believe last year she was very depressed and she may have signed under the pressure from the family.  She like to give this led to her daughter who need money.  Patient has some who lives in [place] but he is not interested in the line.  Patient has a daughter who lives in [place].  She has another daughter who lives in a group home due to special needs.  She sleeping good.  She has no tremors shakes or any EPS.  She is scheduled to see her primary care physician is Dr. Nancy Simmons on Friday.  Patient denies any crying spells or any feeling of hopelessness or worthlessness.  She is social, active, more hopeful denies any suicidal thoughts or homicidal thought.  She denies any nightmares or any flashback.  She lives by herself.  Patient denies drinking alcohol or using any illegal substances.  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   Her appetite is okay.  She still have struggled controlling soda but she cut down her sweets.  Visit Diagnosis:    ICD-9-CM ICD-10-CM   1. Major depressive disorder, recurrent episode, mild (HCC) 296.31 F33.0 Naltrexone-Bupropion HCl ER (CONTRAVE) 8-90 MG TB12     citalopram (CELEXA) 20 MG tablet     QUEtiapine (SEROQUEL) 400 MG tablet     pregabalin (LYRICA) 75 MG capsule     clonazePAM (KLONOPIN) 0.5 MG tablet     DISCONTINUED: clonazePAM (KLONOPIN) 0.5 MG tablet     DISCONTINUED: pregabalin (LYRICA) 75 MG capsule  2. Morbid obesity ([place]) 278.01 E66.01 Naltrexone-Bupropion HCl ER (CONTRAVE) 8-90 MG TB12    Past Psychiatric History: Reviewed. Patient has multiple hospitalization. Her last hospitalization was in June 2016. She has done multiple intensive outpatient program. She has history of hallucination, paranoia, severe depression. In the past she had seen Kendra Simmons, Dr. Reece Simmons and Dr. Thurmond Simmons. She has taken Xanax, amitriptyline, imipramine, temazepam, trazodone, Zyprexa, Luvox, Wellbutrin, Prozac, Vistaril and Bellsomra.  Recently she was given Remeron but she is trying to lose and it was discontinued.  Past Medical History:  Past Medical History:  Diagnosis Date  . Anxiety   . Arthritis   . Depression   . Emotional depression 02/04/2015  . GERD (gastroesophageal reflux disease)   .  High cholesterol   . Insomnia   . Memory loss     Past Surgical History:  Procedure Laterality Date  . CESAREAN SECTION    . COSMETIC SURGERY    . ESOPHAGEAL MANOMETRY N/A 09/26/2016   Procedure: ESOPHAGEAL MANOMETRY (EM);  Surgeon: Kendra Essex, MD;  Location: [facility];  Service: Endoscopy;  Laterality: N/A;  . laproscopy      Family Psychiatric History: Reviewed.  Family History:  Family History  Problem Relation Age of Onset  . Sleep apnea Father   . Alcohol abuse Father   . Diabetes Mother   . Diabetes Sister   . Diabetes Maternal Uncle   . Diabetes Cousin     Social  History:  Social History   Social History  . Marital status: Divorced    Spouse name: N/A  . Number of children: 3  . Years of education: 14   Occupational History  . Retired    Social History Main Topics  . Smoking status: Never Smoker  . Smokeless tobacco: Never Used  . Alcohol use No     Comment: zero  . Drug use: No     Comment: Denies any drug use other than benzos  . Sexual activity: No   Other Topics Concern  . None   Social History Narrative   Patient lives at home alone.   Caffeine Use:  2 cokes daily   Sister lives next door.    Allergies: No Known Allergies  Metabolic Disorder Labs: Lab Results  Component Value Date   HGBA1C 5.8 (H) 11/04/2014   MPG 120 (H) 11/04/2014   MPG 134 (H) 07/13/2014   Lab Results  Component Value Date   PROLACTIN 50.3 (H) 05/25/2015   Lab Results  Component Value Date   CHOL 177 11/04/2014   TRIG 79 11/04/2014   HDL 45 11/04/2014   CHOLHDL 3.9 11/04/2014   VLDL 16 11/04/2014   LDLCALC 116 (H) 11/04/2014   LDLCALC 71 07/20/2014     Current Medications: Current Outpatient Prescriptions  Medication Sig Dispense Refill  . acetaminophen (TYLENOL) 500 MG tablet Take 1,000 mg by mouth every 6 (six) hours as needed for mild pain or headache.    . citalopram (CELEXA) 20 MG tablet Take 1 tablet (20 mg total) by mouth daily. 30 tablet 1  . clonazePAM (KLONOPIN) 0.5 MG tablet Take 1 tablet (0.5 mg total) by mouth at bedtime. 30 tablet 1  . CVS VITAMIN B-6 100 MG tablet 1 TABLET BY MOUTH TWICE DAILY  2  . diclofenac sodium (VOLTAREN) 1 % GEL Apply 2 g topically 4 (four) times daily. 100 g 3  . doxepin (SINEQUAN) 25 MG capsule Take 1 capsule (25 mg total) by mouth at bedtime. 30 capsule 1  . ibuprofen (ADVIL,MOTRIN) 200 MG tablet Take 400 mg by mouth 2 (two) times daily.    [name] linaclotide (LINZESS) 145 MCG CAPS capsule Take 145 mcg by mouth every other day.    . meloxicam (MOBIC) 7.5 MG tablet Take 1 tablet (7.5 mg total) by  mouth 2 (two) times daily. 60 tablet 3  . memantine (NAMENDA) 10 MG tablet Take 1 tablet (10 mg total) by mouth 2 (two) times daily. 60 tablet 12  . methylPREDNISolone (MEDROL) 4 MG tablet Take 1 tablet (4 mg total) by mouth daily. 21 tablet 0  . Naltrexone-Bupropion HCl ER (CONTRAVE) 8-90 MG TB12 Take 8-90 mg by mouth every morning. 30 tablet 1  . omeprazole (PRILOSEC) 40 MG capsule 40  mg.  3  . pravastatin (PRAVACHOL) 20 MG tablet Take 1 tablet (20 mg total) by mouth at bedtime. For high cholesterol    . pregabalin (LYRICA) 75 MG capsule Take 1 capsule (75 mg total) by mouth at bedtime. 90 capsule 0  . promethazine (PHENERGAN) 25 MG tablet 1 TABLET AS NEEDED EVERY 12 HRS ORALLY 7 DAYS  0  . QUEtiapine (SEROQUEL) 400 MG tablet Take 1 tablet (400 mg total) by mouth at bedtime. 30 tablet 2  . tamsulosin (FLOMAX) 0.4 MG CAPS capsule Take 1 capsule (0.4 mg total) by mouth daily after supper. For frequent urination/urgency 15 capsule 0   No current facility-administered medications for this visit.     Neurologic: Headache: No Seizure: No Paresthesias: Yes  Musculoskeletal: Strength & Muscle Tone: within normal limits Gait & Station: normal Patient leans: N/A  Psychiatric Specialty Exam: Review of Systems  Constitutional: Negative for weight loss.  HENT: Negative.   Eyes: Negative.   Respiratory: Negative.   Cardiovascular: Negative.  Negative for chest pain.  Musculoskeletal: Positive for joint pain.  Skin: Negative for itching and rash.  Neurological: Positive for tingling. Negative for dizziness.    Blood pressure 126/64, pulse 92, height 5' 4.5" (1.638 m), weight 204 lb (92.5 kg), SpO2 97 %.Body mass index is 34.48 kg/m.  General Appearance: Fairly Groomed and Overweight  Eye Contact:  Good  Speech:  Clear and Coherent  Volume:  Normal  Mood:  Euthymic  Affect:  Congruent  Thought Process:  Goal Directed  Orientation:  Full (Time, Place, and Person)  Thought Content: WDL  and Logical   Suicidal Thoughts:  No  Homicidal Thoughts:  No  Memory:  Immediate;   Good Recent;   Good Remote;   Good  Judgement:  Good  Insight:  Good  Psychomotor Activity:  Normal  Concentration:  Concentration: Good and Attention Span: Good  Recall:  Good  Fund of Knowledge: Good  Language: Good  Akathisia:  No  Handed:  Right  AIMS (if indicated):  0  Assets:  Communication Skills Desire for Improvement Housing Physical Health Resilience  ADL's:  Intact  Cognition: WNL  Sleep:  Good     Assessment: Major depressive disorder, recurrent.  Obesity  Plan: Patient is doing better but continued to struggle with weight gain.  We discussed psychosocial stressors in current medication.  Patient reluctant to reduce Lyrica Seroquel and doxepin.  She is no longer taking Remeron.  She sleeping better.  I had a long discussion with the patient about her calorie intake, doing regular exercise and watching her sugar intake.  I would also increase her Contrave 8-90 mg twice a day.  We will consider stopping Celexa but patient decided to have any tremors or side effects.  I reminded then it may cause insomnia.  If patient is still have trouble losing weight but she agreed to think about cutting down the Seroquel.  Patient is scheduled to see her primary care physician Friday and I reminded her that she should get the blood work and faxed to [place].  We will get her consent to get collateral information from her primary care physician.  I offer counseling but patient declined.  I recommended to call us back if she has any question, concern or if she feels worsening of the symptom.  Follow-up in 3 months.  Oaklee Esther T., MD 05/08/2017, 10:20 AM

## 2017-05-10 ENCOUNTER — Other Ambulatory Visit (HOSPITAL_COMMUNITY): Payer: Self-pay | Admitting: Psychiatry

## 2017-05-10 DIAGNOSIS — F330 Major depressive disorder, recurrent, mild: Secondary | ICD-10-CM

## 2017-06-04 ENCOUNTER — Other Ambulatory Visit (HOSPITAL_COMMUNITY): Payer: Self-pay

## 2017-06-04 DIAGNOSIS — F330 Major depressive disorder, recurrent, mild: Secondary | ICD-10-CM

## 2017-06-08 ENCOUNTER — Other Ambulatory Visit (HOSPITAL_COMMUNITY): Payer: Self-pay | Admitting: Psychiatry

## 2017-06-08 DIAGNOSIS — F330 Major depressive disorder, recurrent, mild: Secondary | ICD-10-CM

## 2017-06-08 DIAGNOSIS — F331 Major depressive disorder, recurrent, moderate: Secondary | ICD-10-CM

## 2017-06-10 ENCOUNTER — Other Ambulatory Visit (HOSPITAL_COMMUNITY): Payer: Self-pay

## 2017-06-10 DIAGNOSIS — F330 Major depressive disorder, recurrent, mild: Secondary | ICD-10-CM

## 2017-06-13 ENCOUNTER — Other Ambulatory Visit (HOSPITAL_COMMUNITY): Payer: Self-pay | Admitting: Psychiatry

## 2017-06-13 DIAGNOSIS — F330 Major depressive disorder, recurrent, mild: Secondary | ICD-10-CM

## 2017-06-18 ENCOUNTER — Other Ambulatory Visit (HOSPITAL_COMMUNITY): Payer: Self-pay

## 2017-06-18 DIAGNOSIS — F330 Major depressive disorder, recurrent, mild: Secondary | ICD-10-CM

## 2017-06-18 MED ORDER — PREGABALIN 75 MG PO CAPS: 75.0000 mg | ORAL_CAPSULE | Freq: Every day | ORAL | 0 refills | Status: DC

## 2017-06-18 MED ORDER — DOXEPIN HCL 25 MG PO CAPS: 25.0000 mg | ORAL_CAPSULE | Freq: Every day | ORAL | 1 refills | Status: DC

## 2017-07-01 ENCOUNTER — Ambulatory Visit (INDEPENDENT_AMBULATORY_CARE_PROVIDER_SITE_OTHER): Payer: Medicare Other

## 2017-07-01 ENCOUNTER — Encounter (INDEPENDENT_AMBULATORY_CARE_PROVIDER_SITE_OTHER): Payer: Self-pay | Admitting: Physician Assistant

## 2017-07-01 ENCOUNTER — Ambulatory Visit (INDEPENDENT_AMBULATORY_CARE_PROVIDER_SITE_OTHER): Payer: Medicare Other | Admitting: Physician Assistant

## 2017-07-01 DIAGNOSIS — M545 Low back pain, unspecified: Secondary | ICD-10-CM | POA: Insufficient documentation

## 2017-07-01 DIAGNOSIS — S46319A Strain of muscle, fascia and tendon of triceps, unspecified arm, initial encounter: Secondary | ICD-10-CM | POA: Diagnosis not present

## 2017-07-01 DIAGNOSIS — G8929 Other chronic pain: Secondary | ICD-10-CM

## 2017-07-01 NOTE — Progress Notes (Signed)
Office Visit Note   Patient: Kendra Simmons           Date of Birth: June 03, 1951           MRN: [id number] Visit Date: 07/01/2017              Requested by: Donald Prose, MD [address] [place], [place] [address] PCP: Donald Prose, MD   Assessment & Plan: Visit Diagnoses:  1. Chronic bilateral low back pain, without sciatica presence unspecified   2. Strain of triceps muscle, initial encounter     Plan: Send her to physical therapy for her low back also for triceps pain. This is to include modalities, dry needling generalized strengthening, and home exercise program. Discussed with her that she should not take Mobley and Aleve at the same time or any to NSAIDs that she should be on just 1 NSAID.  Follow-Up Instructions: Return in about 6 weeks (around 08/12/2017), or DR. BLACKMAN.   Orders:  Orders Placed This Encounter  Procedures  . XR Lumbar Spine 2-3 Views   No orders of the defined types were placed in this encounter.     Procedures: No procedures performed   Clinical Data: No additional findings.   Subjective: Chief Complaint  Patient presents with  . Lower Back - Pain    HPI Kendra Simmons returns today for follow-up of her low back pain. She states that the Medrol Dosepak did not help with her low back pain and buttocks pain. She did not do any of the exercises as I discussed with or or the handouts that I gave her. She does feel that the injection in her left knee was helpful. She is complaining of pain in the backs of both arms today. No neck pain. No radicular symptoms down the upper extremities or the lower extremities. She did fine that she had tramadol after dental procedure and this helped with some of her back pain there is some concern of serotonin syndrome with some of her antidepressants with this in she's been told by her pharmacist to be careful taking the tramadol. States the low back pain inhibits her from lifting anything without  increased pain. She states they unable to sweep or even changed Later without pain in her low back. She is having no bowel or bladder dysfunction. 7 awaking pain in the back. She's had no injuries to the upper extremities. Review of Systems Please see history of present illness otherwise negative  Objective: Vital Signs: There were no vitals taken for this visit.  Physical Exam  Constitutional: She is oriented to person, place, and time. She appears well-developed and well-nourished. No distress.  Cardiovascular: Intact distal pulses.   Pulmonary/Chest: Effort normal.  Neurological: She is alert and oriented to person, place, and time.  Skin: She is not diaphoretic.  Psychiatric: She has a normal mood and affect. Her behavior is normal.    Ortho Exam Upper extremities she has good strength throughout against resistance. Tenderness triceps region bilaterally. No rashes skin lesions ulcerations. Good range of motion bilateral shoulders without pain. Lower extremities she has 5 out of 5 strengths throughout against resistance. Negative straight leg raise bilaterally. Tight hamstrings bilaterally. Good range of motion of both hips. Tenderness in the lower lumbar paraspinous region bilaterally. Specialty Comments:  No specialty comments available.  Imaging: Xr Lumbar Spine 2-3 Views  Result Date: 07/01/2017 AP lumbar spine: No acute fractures. No bony lesions. Disc space overall well maintained. Slight loss of  normal lordotic curvature. No spondylolisthesis    PMFS History: Patient Active Problem List   Diagnosis Date Noted  . Chronic bilateral low back pain 07/01/2017  . Dehydration 07/03/2015  . UTI (lower urinary tract infection) 07/03/2015  . Hypokalemia 07/03/2015  . AKI (acute kidney injury) ([place]) 07/03/2015  . Tachycardia 05/27/2015  . MDD (major depressive disorder), recurrent, severe, with psychosis ([place]) 05/25/2015  . 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 Insomnia  disorder, with non-sleep disorder mental comorbidity, 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  . 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 Snoring 12/24/2014  . Unintended weight loss 12/24/2014  . 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 Panic attacks 07/26/2014  . Thrombocytopenia ([place]) 12/17/2012  . DYSLIPIDEMIA 03/13/2010  . GERD 03/13/2010   Past Medical History:  Diagnosis Date  . Anxiety   . Arthritis   . Depression   . Emotional depression 02/04/2015  . GERD (gastroesophageal reflux disease)   . High cholesterol   . Insomnia   . Memory loss     Family History  Problem Relation Age of Onset  . Sleep apnea Father   . Alcohol abuse Father   . Diabetes Mother   . Diabetes Sister   . Diabetes Maternal Uncle   . Diabetes Cousin     Past Surgical History:  Procedure Laterality Date  . CESAREAN SECTION    . COSMETIC SURGERY    . ESOPHAGEAL MANOMETRY N/A 09/26/2016   Procedure: ESOPHAGEAL MANOMETRY (EM);  Surgeon: Clarene Essex, MD;  Location: [facility];  Service: Endoscopy;  Laterality: N/A;  . laproscopy     Social History   Occupational History  . Retired    Social History Main Topics  . Smoking status: Never Smoker  . Smokeless tobacco: Never Used  . Alcohol use No     Comment: zero  . Drug use: No     Comment: Denies any drug use other than benzos  . Sexual activity: No

## 2017-07-22 ENCOUNTER — Other Ambulatory Visit (HOSPITAL_COMMUNITY): Payer: Self-pay

## 2017-07-22 DIAGNOSIS — F330 Major depressive disorder, recurrent, mild: Secondary | ICD-10-CM

## 2017-07-22 MED ORDER — PREGABALIN 75 MG PO CAPS: 75.0000 mg | ORAL_CAPSULE | Freq: Every day | ORAL | 0 refills | Status: DC

## 2017-07-22 NOTE — Progress Notes (Signed)
Patient is calling because she gets her Lyrica through [facility] and they have not sent her a shipment. Patient and I both received a letter stating she was approved and that the meds were shipped, but that was July 23 and she has not received them yet. Per patient request, I will send a 30 day supply to her pharmacy and she comes to see the doctor next week.

## 2017-08-08 ENCOUNTER — Encounter (HOSPITAL_COMMUNITY): Payer: Self-pay | Admitting: Psychiatry

## 2017-08-08 ENCOUNTER — Ambulatory Visit (INDEPENDENT_AMBULATORY_CARE_PROVIDER_SITE_OTHER): Payer: Medicare Other | Admitting: Psychiatry

## 2017-08-08 VITALS — BP 126/78 | HR 102 | Ht 64.5 in | Wt 208.2 lb

## 2017-08-08 DIAGNOSIS — M2550 Pain in unspecified joint: Secondary | ICD-10-CM

## 2017-08-08 DIAGNOSIS — G4700 Insomnia, unspecified: Secondary | ICD-10-CM

## 2017-08-08 DIAGNOSIS — F330 Major depressive disorder, recurrent, mild: Secondary | ICD-10-CM | POA: Diagnosis not present

## 2017-08-08 DIAGNOSIS — Z811 Family history of alcohol abuse and dependence: Secondary | ICD-10-CM

## 2017-08-08 DIAGNOSIS — M549 Dorsalgia, unspecified: Secondary | ICD-10-CM

## 2017-08-08 MED ORDER — CITALOPRAM HYDROBROMIDE 20 MG PO TABS: 20.0000 mg | ORAL_TABLET | Freq: Every day | ORAL | 0 refills | Status: DC

## 2017-08-08 MED ORDER — PREGABALIN 75 MG PO CAPS: 75.0000 mg | ORAL_CAPSULE | Freq: Every day | ORAL | 0 refills | Status: DC

## 2017-08-08 MED ORDER — QUETIAPINE FUMARATE 400 MG PO TABS: 400.0000 mg | ORAL_TABLET | Freq: Every day | ORAL | 0 refills | Status: DC

## 2017-08-08 MED ORDER — CLONAZEPAM 0.5 MG PO TABS: 0.5000 mg | ORAL_TABLET | Freq: Every day | ORAL | 2 refills | Status: DC

## 2017-08-08 MED ORDER — DOXEPIN HCL 25 MG PO CAPS: 25.0000 mg | ORAL_CAPSULE | Freq: Every day | ORAL | 1 refills | Status: DC

## 2017-08-08 MED ORDER — BUPROPION HCL ER (XL) 150 MG PO TB24: 150.0000 mg | ORAL_TABLET | ORAL | 0 refills | Status: DC

## 2017-08-08 NOTE — Progress Notes (Signed)
BH MD/PA/NP OP Progress Note  08/08/2017 10:13 AM Kendra Simmons  MRN:  [id number]  Chief Complaint:  I cut down Contrave because it is causing insomnia.  HPI: Kendra Simmons came for her follow-up appointment.  She cut down her Contrave to 1 a day because twice a day was causing insomnia.  She is frustrated because she continued to gain weight and not able to lose weight.  Overall she described her mood and depression is good.  She denies any irritability, anger, mania or any psychosis.  She is taking Celexa is helping her depression.  We have discussed cutting down her Lyrica and Seroquel because of weight gain but she is afraid to cut down these medication because it is helping her sleep depression and anxiety.  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  She still regret about her family situation because her sister and family members.  She is still in touch with her daughter who lives in [place].  Patient saw her primary care physician Dr. Nancy Simmons but there were no changes in her medication.  She is usually seen orthopedic for her chronic back pain.  She denies any feeling of hopelessness or worthlessness.  She denies any crying spells or any suicidal thoughts.  She is more active, social denies any suicidal thoughts or homicidal thought.  Her appetite is okay.  She still struggling with weight gain.    Visit Diagnosis:    ICD-10-CM   1. Major depressive disorder, recurrent episode, mild (HCC) F33.0 QUEtiapine (SEROQUEL) 400 MG tablet    doxepin (SINEQUAN) 25 MG capsule    clonazePAM (KLONOPIN) 0.5 MG tablet    citalopram (CELEXA) 20 MG tablet    pregabalin (LYRICA) 75 MG capsule    buPROPion (WELLBUTRIN XL) 150 MG 24 hr tablet    Past Psychiatric History: Reviewed. Patient has multiple hospitalization. Her last hospitalization was in June 2016. She has done multiple intensive outpatient program. She has history of  hallucination, paranoia, severe depression. In the past she had seen Kendra Simmons, Dr. Reece Simmons and Dr. Thurmond Simmons. She has taken Xanax, amitriptyline, imipramine, temazepam, trazodone, Zyprexa, Luvox, Wellbutrin, Prozac, Vistaril, Remeron and Bellsomra.    Past Medical History:  Past Medical History:  Diagnosis Date  . Anxiety   . Arthritis   . Depression   . Emotional depression 02/04/2015  . GERD (gastroesophageal reflux disease)   . High cholesterol   . Insomnia   . Memory loss     Past Surgical History:  Procedure Laterality Date  . CESAREAN SECTION    . COSMETIC SURGERY    . ESOPHAGEAL MANOMETRY N/A 09/26/2016   Procedure: ESOPHAGEAL MANOMETRY (EM);  Surgeon: Clarene Essex, MD;  Location: [facility];  Service: Endoscopy;  Laterality: N/A;  . laproscopy      Family Psychiatric History: Reviewed.  Family History:  Family History  Problem Relation Age of Onset  . Sleep apnea Father   . Alcohol abuse Father   . Diabetes Mother   . Diabetes Sister   . Diabetes Maternal Uncle   . Diabetes Cousin     Social History:  Social History   Social History  . Marital status: Divorced    Spouse name: N/A  . Number of children: 3  . Years of education: 14   Occupational History  . Retired    Social History Main Topics  . Smoking status: Never Smoker  . Smokeless  tobacco: Never Used  . Alcohol use No     Comment: zero  . Drug use: No     Comment: Denies any drug use other than benzos  . Sexual activity: No   Other Topics Concern  . None   Social History Narrative   Patient lives at home alone.   Caffeine Use:  2 cokes daily   Sister lives next door.    Allergies: No Known Allergies  Metabolic Disorder Labs: Lab Results  Component Value Date   HGBA1C 5.8 (H) 11/04/2014   MPG 120 (H) 11/04/2014   MPG 134 (H) 07/13/2014   Lab Results  Component Value Date   PROLACTIN 50.3 (H) 05/25/2015   Lab Results  Component Value Date   CHOL 177 11/04/2014   TRIG 79  11/04/2014   HDL 45 11/04/2014   CHOLHDL 3.9 11/04/2014   VLDL 16 11/04/2014   LDLCALC 116 (H) 11/04/2014   LDLCALC 71 07/20/2014   Lab Results  Component Value Date   TSH 1.523 07/04/2015   TSH 2.704 05/29/2015    Therapeutic Level Labs: No results found for: LITHIUM No results found for: VALPROATE No components found for:  CBMZ  Current Medications: Current Outpatient Prescriptions  Medication Sig Dispense Refill  . citalopram (CELEXA) 20 MG tablet Take 1 tablet (20 mg total) by mouth daily. 90 tablet 0  . clonazePAM (KLONOPIN) 0.5 MG tablet Take 1 tablet (0.5 mg total) by mouth at bedtime. 30 tablet 2  . CVS VITAMIN B-6 100 MG tablet 1 TABLET BY MOUTH TWICE DAILY  2  . diclofenac sodium (VOLTAREN) 1 % GEL Apply 2 g topically 4 (four) times daily. 100 g 3  . doxepin (SINEQUAN) 25 MG capsule Take 1 capsule (25 mg total) by mouth at bedtime. 30 capsule 1  . ibuprofen (ADVIL,MOTRIN) 200 MG tablet Take 400 mg by mouth 2 (two) times daily.    [name] linaclotide (LINZESS) 145 MCG CAPS capsule Take 145 mcg by mouth every other day.    . meloxicam (MOBIC) 7.5 MG tablet Take 1 tablet (7.5 mg total) by mouth 2 (two) times daily. 60 tablet 3  . memantine (NAMENDA) 10 MG tablet Take 1 tablet (10 mg total) by mouth 2 (two) times daily. 60 tablet 12  . Naltrexone-Bupropion HCl ER (CONTRAVE) 8-90 MG TB12 Take 8-90 mg by mouth 2 (two) times daily. 60 tablet 2  . omeprazole (PRILOSEC) 40 MG capsule 40 mg.  3  . pravastatin (PRAVACHOL) 20 MG tablet Take 1 tablet (20 mg total) by mouth at bedtime. For high cholesterol    . pregabalin (LYRICA) 75 MG capsule Take 1 capsule (75 mg total) by mouth at bedtime. 30 capsule 0  . promethazine (PHENERGAN) 25 MG tablet 1 TABLET AS NEEDED EVERY 12 HRS ORALLY 7 DAYS  0  . QUEtiapine (SEROQUEL) 400 MG tablet Take 1 tablet (400 mg total) by mouth at bedtime. 90 tablet 0  . tamsulosin (FLOMAX) 0.4 MG CAPS capsule Take 1 capsule (0.4 mg total) by mouth daily after  supper. For frequent urination/urgency 15 capsule 0   No current facility-administered medications for this visit.      Musculoskeletal: Strength & Muscle Tone: within normal limits Gait & Station: normal Patient leans: N/A  Psychiatric Specialty Exam: Review of Systems  Constitutional: Negative for weight loss.  HENT: Negative.   Respiratory: Negative.   Genitourinary: Negative.   Musculoskeletal: Positive for back pain and joint pain.  Skin: Negative for itching and rash.  Neurological:  Positive for tingling.    Blood pressure 126/78, pulse (!) 102, height 5' 4.5" (1.638 m), weight 208 lb 3.2 oz (94.4 kg).Body mass index is 35.19 kg/m.  General Appearance: Casual  Eye Contact:  Good  Speech:  Clear and Coherent  Volume:  Normal  Mood:  Euthymic  Affect:  Appropriate  Thought Process:  Goal Directed  Orientation:  Full (Time, Place, and Person)  Thought Content: Logical   Suicidal Thoughts:  No  Homicidal Thoughts:  No  Memory:  Immediate;   Good Recent;   Good Remote;   Good  Judgement:  Good  Insight:  Good  Psychomotor Activity:  Normal  Concentration:  Concentration: Good and Attention Span: Good  Recall:  Good  Fund of Knowledge: Good  Language: Good  Akathisia:  No  Handed:  Right  AIMS (if indicated): not done  Assets:  Communication Skills Desire for Improvement Housing Resilience Social Support  ADL's:  Intact  Cognition: WNL  Sleep:  Fair   Screenings: AIMS     ED to Hosp-Admission (Discharged) from 05/24/2015 in [facility]  AIMS Total Score  0    AUDIT     ED to Hosp-Admission (Discharged) from 05/24/2015 in [facility] Admission (Discharged) from 12/13/2014 in [facility] Admission (Discharged) from 11/02/2014 in [facility] Admission (Discharged) from 07/11/2014 in [facility]   Alcohol Use Disorder Identification Test Final Score (AUDIT)  0  0  0  0    Mini-Mental     Office Visit from 11/16/2015 in [facility] Office Visit from 03/10/2015 in [facility]  Total Score (max 30 points )  24  21    PHQ2-9     Counselor from 09/12/2015 in [facility]  PHQ-2 Total Score  2  PHQ-9 Total Score  4      Assessment: Major depressive disorder, recurrent.  Plan; I review records from other providers.  She is seeing orthopedic doctor and getting physical therapy for her back pain.  She is frustrated with her weight gain.  She does not want to continue Contrave because it is not helping and also very expensive.  I recommended to try Wellbutrin XL 150 mg in the morning and continue other medications.  I also suggested that she should get nutritional consult for weight loss and she agreed with the plan.  We will also get records from her primary care physician since recently she had blood work results.  Overall her depression is improved.  She is very reluctant to cut down Lyrica and Seroquel and doxepin because she believes they are helping her depression.  I recommended to call us back if she is any question, concern or if she feels worsening of the symptoms.  Follow-up in 3 months.  Time spent 25 minutes.    Mujtaba Bollig T., MD 08/08/2017, 10:13 AM

## 2017-08-15 ENCOUNTER — Ambulatory Visit (INDEPENDENT_AMBULATORY_CARE_PROVIDER_SITE_OTHER): Payer: Medicare Other | Admitting: Orthopaedic Surgery

## 2017-08-15 ENCOUNTER — Ambulatory Visit (INDEPENDENT_AMBULATORY_CARE_PROVIDER_SITE_OTHER): Payer: Self-pay

## 2017-08-15 DIAGNOSIS — M533 Sacrococcygeal disorders, not elsewhere classified: Secondary | ICD-10-CM

## 2017-08-15 MED ORDER — TRAMADOL HCL 50 MG PO TABS: 50.0000 mg | ORAL_TABLET | Freq: Three times a day (TID) | ORAL | 0 refills | Status: DC | PRN

## 2017-08-15 NOTE — Progress Notes (Signed)
The patient is continue to follow-up from physical therapy on her lumbar spine and her hips. This is due to chronic back pain. I do have a prescription for me for a TENS unit today and I did side. She does report that she's been having pain in her coccyx for now a year and a half pounds slowly worse and she does sit on a donut else inside this. She has a history of a pilonidal cyst removed back when she was in her young 64s and this was in that area.  On examination there is a small scar down at the back of her pelvis near the rectal area. There is no is infection or recurrence of the cyst in this area that I can tell on palpation or visualization and inspection of the scar area. She is tender to deep palpation over this area but it is mild pain.  X-rays are difficult to interpret in terms of the bony anatomy due to soft tissue penetration and her obesity. I do not see any gross abnormalities.  She has Voltaren gel at home and we'll have her try to massage this area to 3 times a day with Voltaren gel. I did provide her prescription for tramadol as well for pain. We'll reevaluate her in 4 weeks. If she is continue to have the pain in her coccyx area would likely obtain either a CT or MRI of the pelvis to evaluate this further.

## 2017-08-16 ENCOUNTER — Other Ambulatory Visit (INDEPENDENT_AMBULATORY_CARE_PROVIDER_SITE_OTHER): Payer: Self-pay | Admitting: Orthopaedic Surgery

## 2017-08-29 ENCOUNTER — Other Ambulatory Visit (INDEPENDENT_AMBULATORY_CARE_PROVIDER_SITE_OTHER): Payer: Self-pay

## 2017-08-29 ENCOUNTER — Telehealth (INDEPENDENT_AMBULATORY_CARE_PROVIDER_SITE_OTHER): Payer: Self-pay

## 2017-08-29 MED ORDER — TRAMADOL HCL 50 MG PO TABS: 50.0000 mg | ORAL_TABLET | Freq: Three times a day (TID) | ORAL | 0 refills | Status: DC | PRN

## 2017-08-29 NOTE — Telephone Encounter (Signed)
Please advise 

## 2017-08-29 NOTE — Telephone Encounter (Signed)
Okay to refill her tramadol. 1-2 every 12 hours as needed for pain #60 no refills.

## 2017-08-29 NOTE — Telephone Encounter (Signed)
Patient would like a Rx refill on Tramadol.  Cb# is [phone].  Please advise.  Thank You.

## 2017-08-29 NOTE — Telephone Encounter (Signed)
Called into pharmacy

## 2017-09-12 ENCOUNTER — Ambulatory Visit (INDEPENDENT_AMBULATORY_CARE_PROVIDER_SITE_OTHER): Payer: Medicare Other | Admitting: Orthopaedic Surgery

## 2017-10-14 ENCOUNTER — Other Ambulatory Visit (HOSPITAL_COMMUNITY): Payer: Self-pay | Admitting: Psychiatry

## 2017-10-14 DIAGNOSIS — F330 Major depressive disorder, recurrent, mild: Secondary | ICD-10-CM

## 2017-10-16 ENCOUNTER — Other Ambulatory Visit (HOSPITAL_COMMUNITY): Payer: Self-pay

## 2017-10-16 DIAGNOSIS — F330 Major depressive disorder, recurrent, mild: Secondary | ICD-10-CM

## 2017-10-16 MED ORDER — DOXEPIN HCL 25 MG PO CAPS: 25.0000 mg | ORAL_CAPSULE | Freq: Every day | ORAL | 0 refills | Status: DC

## 2017-10-24 ENCOUNTER — Other Ambulatory Visit (HOSPITAL_COMMUNITY): Payer: Self-pay | Admitting: Psychiatry

## 2017-10-24 DIAGNOSIS — F330 Major depressive disorder, recurrent, mild: Secondary | ICD-10-CM

## 2017-10-28 ENCOUNTER — Other Ambulatory Visit (HOSPITAL_COMMUNITY): Payer: Self-pay

## 2017-10-28 DIAGNOSIS — F330 Major depressive disorder, recurrent, mild: Secondary | ICD-10-CM

## 2017-10-28 MED ORDER — CLONAZEPAM 0.5 MG PO TABS: 0.5000 mg | ORAL_TABLET | Freq: Every day | ORAL | 0 refills | Status: DC

## 2017-10-28 NOTE — Progress Notes (Unsigned)
Patient called today and was out of Klonopin, per protocol I called in a 30 day supply.

## 2017-10-29 ENCOUNTER — Telehealth (INDEPENDENT_AMBULATORY_CARE_PROVIDER_SITE_OTHER): Payer: Self-pay | Admitting: Orthopaedic Surgery

## 2017-10-29 NOTE — Telephone Encounter (Signed)
Kendra Simmons, [place] 1951-09-13 [phone]    Please call pt to discuss her meds.Pt might need a muscle relaxer.Pt is received physical therapy for 4 to 6 weeks and it did help only was a little while. Pt stated she is taking tradmol only as needed.

## 2017-10-29 NOTE — Telephone Encounter (Signed)
Please advise 

## 2017-10-30 ENCOUNTER — Other Ambulatory Visit (INDEPENDENT_AMBULATORY_CARE_PROVIDER_SITE_OTHER): Payer: Self-pay

## 2017-10-30 ENCOUNTER — Ambulatory Visit (HOSPITAL_COMMUNITY): Payer: Self-pay | Admitting: Psychiatry

## 2017-10-30 MED ORDER — TIZANIDINE HCL 4 MG PO CAPS: 4.0000 mg | ORAL_CAPSULE | Freq: Three times a day (TID) | ORAL | 2 refills | Status: DC

## 2017-10-30 NOTE — Telephone Encounter (Signed)
Sent to pharmacy 

## 2017-10-30 NOTE — Telephone Encounter (Signed)
Please send in Zanaflex 4 mg po every 8 hours as needed, #60, 2 refills

## 2017-10-31 ENCOUNTER — Ambulatory Visit (HOSPITAL_COMMUNITY): Payer: Self-pay | Admitting: Psychiatry

## 2017-10-31 ENCOUNTER — Ambulatory Visit (INDEPENDENT_AMBULATORY_CARE_PROVIDER_SITE_OTHER): Payer: Medicare Other | Admitting: Psychiatry

## 2017-10-31 ENCOUNTER — Telehealth (HOSPITAL_COMMUNITY): Payer: Self-pay | Admitting: Psychiatry

## 2017-10-31 ENCOUNTER — Encounter (HOSPITAL_COMMUNITY): Payer: Self-pay | Admitting: Psychiatry

## 2017-10-31 DIAGNOSIS — Z564 Discord with boss and workmates: Secondary | ICD-10-CM | POA: Diagnosis not present

## 2017-10-31 DIAGNOSIS — F419 Anxiety disorder, unspecified: Secondary | ICD-10-CM | POA: Diagnosis not present

## 2017-10-31 DIAGNOSIS — G4700 Insomnia, unspecified: Secondary | ICD-10-CM

## 2017-10-31 DIAGNOSIS — F330 Major depressive disorder, recurrent, mild: Secondary | ICD-10-CM | POA: Diagnosis not present

## 2017-10-31 DIAGNOSIS — Z811 Family history of alcohol abuse and dependence: Secondary | ICD-10-CM

## 2017-10-31 DIAGNOSIS — Z79899 Other long term (current) drug therapy: Secondary | ICD-10-CM | POA: Diagnosis not present

## 2017-10-31 MED ORDER — PREGABALIN 75 MG PO CAPS: 75.0000 mg | ORAL_CAPSULE | Freq: Every day | ORAL | 0 refills | Status: DC

## 2017-10-31 MED ORDER — CLONAZEPAM 0.5 MG PO TABS: 0.5000 mg | ORAL_TABLET | Freq: Every day | ORAL | 1 refills | Status: DC

## 2017-10-31 MED ORDER — QUETIAPINE FUMARATE 400 MG PO TABS: 400.0000 mg | ORAL_TABLET | Freq: Every day | ORAL | 0 refills | Status: DC

## 2017-10-31 MED ORDER — DOXEPIN HCL 50 MG PO CAPS: 50.0000 mg | ORAL_CAPSULE | Freq: Every day | ORAL | 0 refills | Status: DC

## 2017-10-31 MED ORDER — BUPROPION HCL ER (XL) 150 MG PO TB24: 150.0000 mg | ORAL_TABLET | ORAL | 0 refills | Status: DC

## 2017-10-31 NOTE — Telephone Encounter (Signed)
10/31/17 12:16pm The patient needed an sooner appt with a therapist in which we couldn't accomodate - so she was given some other referrals outside the office/sh

## 2017-10-31 NOTE — Progress Notes (Signed)
[facility] MD/PA/NP OP Progress Note  10/31/2017 11:27 AM Kendra Simmons  MRN:  [id number]  Chief Complaint: I am not sleeping well.  I am feeling more sad.  HPI: Kendra Simmons came for her follow-up appointment.  She has noticed lately more sadness and complaining of poor sleep.  Her job is on hold because coworker complained about her and she is trying to resolve the issue.  She is also very stressed about her family member.  Only one sister is talking to her and she admitted she feels sometimes isolated or withdrawn.  Though she denies any suicidal thoughts but admitted more sadness and poor sleep.  She is getting physical therapy and she feels Lyrica is not helping as much.  Some days she does not do anything at home and her daughter comes in help to clean the house.  She also struggling with weight loss.  We have recommended to get nutrition consult but when she call her insurance company the nearest nutrition consult was out of the town.  Patient denies any crying spells or any suicidal thoughts but she has been withdrawn.  She denies any hallucination, paranoia or any self abusive behavior.  Visit Diagnosis:    ICD-10-CM   1. Major depressive disorder, recurrent episode, mild (HCC) F33.0 pregabalin (LYRICA) 75 MG capsule    QUEtiapine (SEROQUEL) 400 MG tablet    buPROPion (WELLBUTRIN XL) 150 MG 24 hr tablet    doxepin (SINEQUAN) 50 MG capsule    clonazePAM (KLONOPIN) 0.5 MG tablet    Past Psychiatric History: Reviewed. Patient has multiple hospitalization. Her last hospitalization was in June 2016. She has done multiple intensive outpatient program. She has history of hallucination, paranoia, severe depression. In the past she had seen [name], [name] and Dr. Thurmond Butts. She has taken Xanax, amitriptyline, imipramine, temazepam, trazodone, Zyprexa, Luvox, Wellbutrin, Prozac, Vistaril, Remeron and Bellsomra.  Past Medical History:  Past Medical History:  Diagnosis Date  . Anxiety   .  Arthritis   . Depression   . Emotional depression 02/04/2015  . GERD (gastroesophageal reflux disease)   . High cholesterol   . Insomnia   . Memory loss     Past Surgical History:  Procedure Laterality Date  . CESAREAN SECTION    . COSMETIC SURGERY    . ESOPHAGEAL MANOMETRY N/A 09/26/2016   Procedure: ESOPHAGEAL MANOMETRY (EM);  Surgeon: Clarene Essex, MD;  Location: [facility];  Service: Endoscopy;  Laterality: N/A;  . laproscopy      Family Psychiatric History: Reviewed.  Family History:  Family History  Problem Relation Age of Onset  . Sleep apnea Father   . Alcohol abuse Father   . Diabetes Mother   . Diabetes Sister   . Diabetes Maternal Uncle   . Diabetes Cousin     Social History:  Social History   Socioeconomic History  . Marital status: Divorced    Spouse name: Not on file  . Number of children: 3  . Years of education: 26  . Highest education level: Not on file  Social Needs  . Financial resource strain: Not on file  . Food insecurity - worry: Not on file  . Food insecurity - inability: Not on file  . Transportation needs - medical: Not on file  . Transportation needs - non-medical: Not on file  Occupational History  . Occupation: Retired  Tobacco Use  . Smoking status: Never Smoker  . Smokeless tobacco: Never Used  Substance and Sexual Activity  . Alcohol  use: No    Alcohol/week: 0.0 oz    Comment: zero  . Drug use: No    Comment: Denies any drug use other than benzos  . Sexual activity: No    Birth control/protection: Abstinence  Other Topics Concern  . Not on file  Social History Narrative   Patient lives at home alone.   Caffeine Use:  2 cokes daily   Sister lives next door.    Allergies: No Known Allergies  Metabolic Disorder Labs: Lab Results  Component Value Date   HGBA1C 5.8 (H) 11/04/2014   MPG 120 (H) 11/04/2014   MPG 134 (H) 07/13/2014   Lab Results  Component Value Date   PROLACTIN 50.3 (H) 05/25/2015   Lab Results   Component Value Date   CHOL 177 11/04/2014   TRIG 79 11/04/2014   HDL 45 11/04/2014   CHOLHDL 3.9 11/04/2014   VLDL 16 11/04/2014   LDLCALC 116 (H) 11/04/2014   LDLCALC 71 07/20/2014   Lab Results  Component Value Date   TSH 1.523 07/04/2015   TSH 2.704 05/29/2015    Therapeutic Level Labs: No results found for: LITHIUM No results found for: VALPROATE No components found for:  CBMZ  Current Medications: Current Outpatient Medications  Medication Sig Dispense Refill  . buPROPion (WELLBUTRIN XL) 150 MG 24 hr tablet Take 1 tablet (150 mg total) by mouth every morning. 90 tablet 0  . citalopram (CELEXA) 20 MG tablet Take 1 tablet (20 mg total) by mouth daily. 90 tablet 0  . clonazePAM (KLONOPIN) 0.5 MG tablet Take 1 tablet (0.5 mg total) at bedtime by mouth. 30 tablet 0  . CVS VITAMIN B-6 100 MG tablet 1 TABLET BY MOUTH TWICE DAILY  2  . diclofenac sodium (VOLTAREN) 1 % GEL Apply 2 g topically 4 (four) times daily. 100 g 3  . doxepin (SINEQUAN) 25 MG capsule Take 1 capsule (25 mg total) by mouth at bedtime. 30 capsule 0  . ibuprofen (ADVIL,MOTRIN) 200 MG tablet Take 400 mg by mouth 2 (two) times daily.    [name] linaclotide (LINZESS) 145 MCG CAPS capsule Take 145 mcg by mouth every other day.    . meloxicam (MOBIC) 7.5 MG tablet TAKE 1 TABLET BY MOUTH TWICE A DAY 60 tablet 3  . memantine (NAMENDA) 10 MG tablet Take 1 tablet (10 mg total) by mouth 2 (two) times daily. 60 tablet 12  . omeprazole (PRILOSEC) 40 MG capsule 40 mg.  3  . pravastatin (PRAVACHOL) 20 MG tablet Take 1 tablet (20 mg total) by mouth at bedtime. For high cholesterol    . pregabalin (LYRICA) 75 MG capsule Take 1 capsule (75 mg total) by mouth at bedtime. 90 capsule 0  . QUEtiapine (SEROQUEL) 400 MG tablet Take 1 tablet (400 mg total) by mouth at bedtime. 90 tablet 0  . tamsulosin (FLOMAX) 0.4 MG CAPS capsule Take 1 capsule (0.4 mg total) by mouth daily after supper. For frequent urination/urgency 15 capsule 0  .  tiZANidine (ZANAFLEX) 4 MG capsule Take 1 capsule (4 mg total) 3 (three) times daily by mouth. 60 capsule 2  . traMADol (ULTRAM) 50 MG tablet Take 1-2 tablets (50-100 mg total) by mouth 3 (three) times daily as needed. 60 tablet 0   No current facility-administered medications for this visit.      Musculoskeletal: Strength & Muscle Tone: within normal limits Gait & Station: normal Patient leans: N/A  Psychiatric Specialty Exam: ROS  Blood pressure 122/78, pulse 90, height 5' 4.5" (  1.638 m), weight 213 lb 9.6 oz (96.9 kg).There is no height or weight on file to calculate BMI.  General Appearance: Casual  Eye Contact:  Good  Speech:  Slow  Volume:  Normal  Mood:  Dysphoric  Affect:  Constricted  Thought Process:  Goal Directed  Orientation:  Full (Time, Place, and Person)  Thought Content: Logical and Rumination   Suicidal Thoughts:  No  Homicidal Thoughts:  No  Memory:  Immediate;   Good Recent;   Good Remote;   Good  Judgement:  Good  Insight:  Good  Psychomotor Activity:  Decreased  Concentration:  Concentration: Fair and Attention Span: Fair  Recall:  Good  Fund of Knowledge: Good  Language: Good  Akathisia:  No  Handed:  Right  AIMS (if indicated): not done  Assets:  Communication Skills Desire for Improvement Housing Resilience  ADL's:  Intact  Cognition: WNL  Sleep:  Fair   Screenings: AIMS     ED to Hosp-Admission (Discharged) from 05/24/2015 in [facility]  AIMS Total Score  0    AUDIT     ED to Hosp-Admission (Discharged) from 05/24/2015 in [facility] Admission (Discharged) from 12/13/2014 in [facility] Admission (Discharged) from 11/02/2014 in [facility] Admission (Discharged) from 07/11/2014 in [facility]  Alcohol Use Disorder Identification Test Final Score (AUDIT)  0  0  0  0     Mini-Mental     Office Visit from 11/16/2015 in [facility] Office Visit from 03/10/2015 in [facility]  Total Score (max 30 points )  24  21    PHQ2-9     Counselor from 09/12/2015 in [facility]  PHQ-2 Total Score  2  PHQ-9 Total Score  4       Assessment and Plan: Major depressive disorder, recurrent worsening.  Anxiety disorder NOS.  Insomnia.  Patient is taking 3 antidepressant and continued to feel sad depressed and isolated.  She has a lot of family issues and recently lost her job.  I strongly encouraged that she should see a counselor for coping skills.  Her other issue is lack of sleep.  I recommended to try doxepin 50 mg and we will discontinue Celexa.  She is taking Wellbutrin and she like to continue 150 mg as she is tolerating well.  I also encouraged that she should contact her primary care physician Dr. Nancy Fetter to get help for nutrition consult.  We are still awaiting records from her primary care physician.  Continue Seroquel 400 mg at bedtime, Lyrica 75 mg at bedtime, Wellbutrin XL 150 mg daily, Klonopin 0.5 mg at bedtime and new dose doxepin 50 mg at bedtime.  Discussed in length medication side effects and benefits.  Discussed sleep hygiene and healthy lifestyle.  Recommended to call us back if she has any question, concern if she feels worsening of the symptoms.  Follow-up in 3 months.   Milda Lindvall T., MD 10/31/2017, 11:27 AM

## 2017-11-04 ENCOUNTER — Telehealth (HOSPITAL_COMMUNITY): Payer: Self-pay

## 2017-11-04 NOTE — Telephone Encounter (Signed)
She is to be careful about Lyrica.  We can authorize early refills but she may have to pay out of her pocket because she is not due.

## 2017-11-04 NOTE — Telephone Encounter (Signed)
Patient called me and said that the pharmacy would not fill her Lyrica because it was too early. She told me she had dropped the bottle and a few rolled under her couch. I called the pharmacy to verify when she last picked up and when she is due. The pharmacist told me that she is not due until December 4th and that patient told her that the doctor allows her to take extra pills. Please review and advise, thank you

## 2017-11-04 NOTE — Telephone Encounter (Signed)
Spoke with the patient and let her know that it was really early and that she would need to be more careful in the future and that we would not refill early again

## 2017-11-14 ENCOUNTER — Other Ambulatory Visit (INDEPENDENT_AMBULATORY_CARE_PROVIDER_SITE_OTHER): Payer: Self-pay | Admitting: Orthopaedic Surgery

## 2017-11-14 NOTE — Telephone Encounter (Signed)
Please advise 

## 2017-11-15 ENCOUNTER — Telehealth (INDEPENDENT_AMBULATORY_CARE_PROVIDER_SITE_OTHER): Payer: Self-pay | Admitting: Orthopaedic Surgery

## 2017-11-15 NOTE — Telephone Encounter (Signed)
Patients pharmacy needs a call stating her diagnosis code before they will give her Tramadol. If you can call pharmacy: [facility]  [address] [phone].

## 2017-11-15 NOTE — Telephone Encounter (Signed)
Diagnosis code given.

## 2017-11-18 ENCOUNTER — Encounter (INDEPENDENT_AMBULATORY_CARE_PROVIDER_SITE_OTHER): Payer: Self-pay | Admitting: Physician Assistant

## 2017-11-18 ENCOUNTER — Ambulatory Visit (INDEPENDENT_AMBULATORY_CARE_PROVIDER_SITE_OTHER): Payer: Medicare Other | Admitting: Physician Assistant

## 2017-11-18 ENCOUNTER — Telehealth (INDEPENDENT_AMBULATORY_CARE_PROVIDER_SITE_OTHER): Payer: Self-pay | Admitting: Radiology

## 2017-11-18 VITALS — Ht 64.5 in | Wt 200.0 lb

## 2017-11-18 DIAGNOSIS — M5416 Radiculopathy, lumbar region: Secondary | ICD-10-CM | POA: Diagnosis not present

## 2017-11-18 DIAGNOSIS — M25551 Pain in right hip: Secondary | ICD-10-CM | POA: Diagnosis not present

## 2017-11-18 MED ORDER — TIZANIDINE HCL 4 MG PO CAPS: 4.0000 mg | ORAL_CAPSULE | Freq: Three times a day (TID) | ORAL | 0 refills | Status: DC

## 2017-11-18 MED ORDER — METHYLPREDNISOLONE ACETATE 40 MG/ML IJ SUSP
40.0000 mg | INTRAMUSCULAR | Status: AC | PRN
  Administered 2017-11-18: 40 mg via INTRA_ARTICULAR

## 2017-11-18 MED ORDER — DICLOFENAC SODIUM 1 % TD GEL: 2.0000 g | Freq: Four times a day (QID) | TRANSDERMAL | 3 refills | Status: DC

## 2017-11-18 MED ORDER — LIDOCAINE HCL 1 % IJ SOLN
3.0000 mL | INTRAMUSCULAR | Status: AC | PRN
  Administered 2017-11-18: 3 mL

## 2017-11-18 MED ORDER — HYDROCODONE-ACETAMINOPHEN 5-325 MG PO TABS: 1.0000 | ORAL_TABLET | Freq: Four times a day (QID) | ORAL | 0 refills | Status: DC | PRN

## 2017-11-18 NOTE — Telephone Encounter (Signed)
Patient called triage line, and said that she s/w Dr Durward Fortes x2 this weekend and he suggested she call for appt today.  She is having LBP and leg pain.  I called home and cell [phone], LM cell.  Per chart is states possible MRI or CT if PT did not help.  Sending to you to see if we need to order something?  Or see her first?  I told her we would call her once we ask that.

## 2017-11-18 NOTE — Telephone Encounter (Signed)
Patient was seen in the office today

## 2017-11-18 NOTE — Progress Notes (Signed)
Office Visit Note   Patient: Kendra Simmons           Date of Birth: 11-30-1951           MRN: [id number] Visit Date: 11/18/2017              Requested by: Donald Prose, MD [address], [place] [address] PCP: Donald Prose, MD   Assessment & Plan: Visit Diagnoses:  1. Radiculopathy, lumbar region     Plan: Did refill her muscle relaxant.  Gave her a few Norco that she is to use sparingly.  Will obtain an MRI of her lumbar spine to rule out HNP as the source of her radicular symptoms on the right.  Have her follow-up after the MRI to go over results to discuss further treatment.  Follow-Up Instructions: No Follow-up on file.   Orders:  Orders Placed This Encounter  Procedures  . MR Lumbar Spine w/o contrast   Meds ordered this encounter  Medications  . HYDROcodone-acetaminophen (NORCO) 5-325 MG tablet    Sig: Take 1 tablet by mouth every 6 (six) hours as needed for moderate pain. One to two tabs every 4-6 hours for pain    Dispense:  15 tablet    Refill:  0  . tiZANidine (ZANAFLEX) 4 MG capsule    Sig: Take 1 capsule (4 mg total) by mouth 3 (three) times daily.    Dispense:  30 capsule    Refill:  0  . diclofenac sodium (VOLTAREN) 1 % GEL    Sig: Apply 2 g topically 4 (four) times daily.    Dispense:  100 g    Refill:  3      Procedures: Large Joint Inj: R greater trochanter on 11/18/2017 11:48 AM Indications: pain Details: 22 G 1.5 in and 3.5 in needle, lateral approach  Arthrogram: No  Medications: 3 mL lidocaine 1 %; 40 mg methylPREDNISolone acetate 40 MG/ML Outcome: tolerated well, no immediate complications Procedure, treatment alternatives, risks and benefits explained, specific risks discussed. Consent was given by the patient. Immediately prior to procedure a time out was called to verify the correct patient, procedure, equipment, support staff and site/side marked as required. Patient was prepped and draped in the usual sterile  fashion.       Clinical Data: No additional findings.   Subjective: Chief Complaint  Patient presents with  . Lower Back - Pain    HPI Kendra Simmons returns today due to low back pain with shooting pain down into her right thigh.  She has had on and off low back pain for some time is gone to physical therapy and treated with conservative measures and is responded well in the past.  However now she is having pain that is waking her up.  She states she is unable to stand to sweep or mop.  She is having difficulty getting up and down from a seated position due to the pain.  She actually called Dr. Durward Fortes this weekend due to her low back pain.  She tried a heating pad.  She is taking Zanaflex and tramadol without any real relief.   Review of Systems  Genitourinary: Positive for decreased urine volume. Negative for dysuria.  Denies bowel or bladder incontinence.  Please see HPI otherwise negative   Objective: Vital Signs: Ht 5' 4.5" (1.638 m)   Wt 200 lb (90.7 kg)   BMI 33.80 kg/m   Physical Exam  Constitutional: She is oriented to person,  place, and time. She appears well-developed and well-nourished. No distress.  Cardiovascular: Intact distal pulses.  Pulmonary/Chest: Effort normal.  Neurological: She is alert and oriented to person, place, and time.  Skin: She is not diaphoretic.  Psychiatric: She has a normal mood and affect.    Ortho Exam Positive straight leg raise bilaterally.  Tenderness right lower lumbar paraspinous region.  5 out of 5 strength lower extremities throughout against resistance.  Deep tendon reflexes are 2+ at the knees and 1+ at the ankles and equal and symmetric.  Sensation grossly intact bilateral feet to light touch.  Good range of motion of both hips.  She has tenderness over the right greater trochanteric region. Specialty Comments:  No specialty comments available.  Imaging: No results found.   PMFS History: Patient Active Problem List    Diagnosis Date Noted  . Chronic bilateral low back pain 07/01/2017  . Dehydration 07/03/2015  . UTI (lower urinary tract infection) 07/03/2015  . Hypokalemia 07/03/2015  . AKI (acute kidney injury) ([place]) 07/03/2015  . Tachycardia 05/27/2015  . MDD (major depressive disorder), recurrent, severe, with psychosis ([place]) 05/25/2015  . 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 Insomnia disorder, with non-sleep disorder mental comorbidity, 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  . 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 Snoring 12/24/2014  . Unintended weight loss 12/24/2014  . Secondary parkinsonism ([place]) 12/24/2014  . Panic attacks 07/26/2014  . Thrombocytopenia ([place]) 12/17/2012  . DYSLIPIDEMIA 03/13/2010  . GERD 03/13/2010   Past Medical History:  Diagnosis Date  . Anxiety   . Arthritis   . Depression   . Emotional depression 02/04/2015  . GERD (gastroesophageal reflux disease)   . High cholesterol   . Insomnia   . Memory loss     Family History  Problem Relation Age of Onset  . Sleep apnea Father   . Alcohol abuse Father   . Diabetes Mother   . Diabetes Sister   . Diabetes Maternal Uncle   . Diabetes Cousin     Past Surgical History:  Procedure Laterality Date  . CESAREAN SECTION    . COSMETIC SURGERY    . ESOPHAGEAL MANOMETRY N/A 09/26/2016   Procedure: ESOPHAGEAL MANOMETRY (EM);  Surgeon: Clarene Essex, MD;  Location: [facility];  Service: Endoscopy;  Laterality: N/A;  . laproscopy     Social History   Occupational History  . Occupation: Retired  Tobacco Use  . Smoking status: Never Smoker  . Smokeless tobacco: Never Used  Substance and Sexual Activity  . Alcohol use: No    Alcohol/week: 0.0 oz    Comment: zero  . Drug use: No    Comment: Denies any drug use other than benzos  . Sexual activity: No    Birth control/protection: Abstinence

## 2017-11-20 ENCOUNTER — Ambulatory Visit (INDEPENDENT_AMBULATORY_CARE_PROVIDER_SITE_OTHER): Payer: Medicare Other | Admitting: Physician Assistant

## 2017-11-20 ENCOUNTER — Telehealth (INDEPENDENT_AMBULATORY_CARE_PROVIDER_SITE_OTHER): Payer: Self-pay | Admitting: Orthopaedic Surgery

## 2017-11-20 NOTE — Telephone Encounter (Signed)
Colletta Maryland with [facility] left a message requesting a verbal order for patient. Please call to advise.

## 2017-11-20 NOTE — Telephone Encounter (Signed)
See below

## 2017-11-20 NOTE — Telephone Encounter (Signed)
Patient says that since her last appointment she has not gotten any better and still in a lot of pain. She did not want to make another appt to be seen she just wanted to let Dr. Ninfa Linden and Artis Delay know. She would like a call back [phone]

## 2017-11-21 NOTE — Telephone Encounter (Signed)
LM on machine giving verbal orders

## 2017-11-21 NOTE — Telephone Encounter (Signed)
Gave her a trochanteric injection due to trochanteric bursitis also ordered an MRI of her lumbar spine.  The injection may take some time to work in regards to trochanteric bursitis.  We will see her back after the MRI of the lumbar spine to go over the results and discuss further treatment

## 2017-11-22 NOTE — Telephone Encounter (Signed)
IC s/w pt and advised. 

## 2017-11-23 ENCOUNTER — Ambulatory Visit
Admission: RE | Admit: 2017-11-23 | Discharge: 2017-11-23 | Disposition: A | Discharge status: Home / Self Care | Payer: Medicare Other | Source: Ambulatory Visit | Attending: Physician Assistant | Admitting: Physician Assistant

## 2017-11-23 DIAGNOSIS — M5416 Radiculopathy, lumbar region: Secondary | ICD-10-CM

## 2017-11-27 ENCOUNTER — Other Ambulatory Visit (INDEPENDENT_AMBULATORY_CARE_PROVIDER_SITE_OTHER): Payer: Self-pay | Admitting: Orthopaedic Surgery

## 2017-11-27 NOTE — Telephone Encounter (Signed)
Patient requests a RX refill on hydrocodone. She says she is in a lot of pain. I moved her appt from Monday 12/17 to tomorrow. She is hoping that she can get her medication then.

## 2017-11-27 NOTE — Telephone Encounter (Signed)
We will see her tomorrow

## 2017-11-27 NOTE — Telephone Encounter (Signed)
Please advise 

## 2017-11-28 ENCOUNTER — Ambulatory Visit (INDEPENDENT_AMBULATORY_CARE_PROVIDER_SITE_OTHER): Payer: Medicare Other | Admitting: Psychology

## 2017-11-28 ENCOUNTER — Other Ambulatory Visit (INDEPENDENT_AMBULATORY_CARE_PROVIDER_SITE_OTHER): Payer: Self-pay

## 2017-11-28 ENCOUNTER — Ambulatory Visit (INDEPENDENT_AMBULATORY_CARE_PROVIDER_SITE_OTHER): Payer: Medicare Other | Admitting: Physician Assistant

## 2017-11-28 ENCOUNTER — Encounter (INDEPENDENT_AMBULATORY_CARE_PROVIDER_SITE_OTHER): Payer: Self-pay | Admitting: Physician Assistant

## 2017-11-28 DIAGNOSIS — G8929 Other chronic pain: Secondary | ICD-10-CM

## 2017-11-28 DIAGNOSIS — M7061 Trochanteric bursitis, right hip: Secondary | ICD-10-CM | POA: Diagnosis not present

## 2017-11-28 DIAGNOSIS — M545 Low back pain, unspecified: Secondary | ICD-10-CM

## 2017-11-28 DIAGNOSIS — F331 Major depressive disorder, recurrent, moderate: Secondary | ICD-10-CM | POA: Diagnosis not present

## 2017-11-28 MED ORDER — TRAMADOL HCL 50 MG PO TABS: 50.0000 mg | ORAL_TABLET | Freq: Four times a day (QID) | ORAL | 0 refills | Status: DC | PRN

## 2017-11-28 MED ORDER — TIZANIDINE HCL 4 MG PO TABS: 4.0000 mg | ORAL_TABLET | Freq: Four times a day (QID) | ORAL | 0 refills | Status: DC | PRN

## 2017-11-28 NOTE — Progress Notes (Signed)
Mrs. Berish returns today you have the results of her MRI of her lumbar spine.  She states that the trochanteric she gave her about 30% relief from her hip pain.  She continues to have what she describes as dull constant pain in the lateral and anterior aspect of the right thigh.  She states it begins in her back and radiates into her thigh.  She denies any numbness tingling down the leg.  She is asking for refill on her tramadol and Zanaflex also hydrocodone.  She is tried physical therapy without any relief   Physical exam: General well-developed well-nourished female no acute distress mood and affect appropriate.   Psych alert and oriented x3.  Straight leg raise bilaterally negative.  Tight hamstrings only.  She has tenderness over the right lower lumbar spine and the right lower paraspinous region.  Slight tenderness over the right trochanteric region.  MRI of the lumbar spine results reviewed with patient actual images from the MRI scan is reviewed.  MRI showed L4-L5 shallow disc protrusion which slightly indents the thecal sac but no compressive stenosis.  Mild facet and ligamentous hypertrophy also at L4-5.  Impression: Low back pain with right thigh pain  Left trochanteric bursitis  Plan: Like for her to continue to work on IT band stretching.  We will have her undergo consultation with Dr. Ernestina Patches for possible epidural steroid injection versus transforaminal injection L4-5.  Refill tramadol and Zanaflex.  Did not refill her hydrocodone do not feel is appropriate at this time.  She will follow-up with [place] in 1 month.

## 2017-11-29 ENCOUNTER — Telehealth (INDEPENDENT_AMBULATORY_CARE_PROVIDER_SITE_OTHER): Payer: Self-pay | Admitting: Physician Assistant

## 2017-11-29 NOTE — Telephone Encounter (Signed)
[place] [phone]    Pt called and she needs the quantity for her medicine. Pt is currently at the pharmacy on [address].Pharmacy will not give pt her medicine.

## 2017-11-29 NOTE — Telephone Encounter (Signed)
They did hear quantitiy and directions on VM

## 2017-12-01 ENCOUNTER — Other Ambulatory Visit: Payer: Self-pay

## 2017-12-02 ENCOUNTER — Ambulatory Visit (INDEPENDENT_AMBULATORY_CARE_PROVIDER_SITE_OTHER): Payer: Medicare Other | Admitting: Physician Assistant

## 2017-12-11 ENCOUNTER — Telehealth (INDEPENDENT_AMBULATORY_CARE_PROVIDER_SITE_OTHER): Payer: Self-pay | Admitting: Orthopaedic Surgery

## 2017-12-11 MED ORDER — TRAMADOL HCL 50 MG PO TABS: 50.0000 mg | ORAL_TABLET | Freq: Four times a day (QID) | ORAL | 0 refills | Status: DC | PRN

## 2017-12-11 MED ORDER — TIZANIDINE HCL 4 MG PO TABS: 4.0000 mg | ORAL_TABLET | Freq: Four times a day (QID) | ORAL | 0 refills | Status: DC | PRN

## 2017-12-11 NOTE — Telephone Encounter (Signed)
Rx refill Tizanidine (tablet) & Tramadol

## 2017-12-11 NOTE — Telephone Encounter (Signed)
That will be fine to refill both of those meds

## 2017-12-11 NOTE — Telephone Encounter (Signed)
Called to pharmacy. I called patient and advised. 

## 2017-12-11 NOTE — Telephone Encounter (Signed)
Ok for refill? 

## 2017-12-23 ENCOUNTER — Telehealth (HOSPITAL_COMMUNITY): Payer: Self-pay

## 2017-12-23 NOTE — Telephone Encounter (Signed)
She is taking Klonopin, Sinequan, hydrocodone, Lyrica, Seroquel, muscle relaxant at bedtime.  Her insomnia is chronic in nature and we have discussed this issue many time in the past.  At this time cannot add any more medication however we will discussed this issue on her next appointment.

## 2017-12-23 NOTE — Telephone Encounter (Signed)
Patient is calling to say that she can not sleep and she is up all night ruminating. She is currently on Seroquel and Doxepin at bedtime and it looks like Dr. Mackey Birchwood (Ortho) has her on Tramadol and tizanidine. Please review and advise, thank you

## 2017-12-24 NOTE — Telephone Encounter (Signed)
Attempted to call patient on both numbers in the chart, no answer on either number and voicemail's not set up.

## 2017-12-25 ENCOUNTER — Telehealth (INDEPENDENT_AMBULATORY_CARE_PROVIDER_SITE_OTHER): Payer: Self-pay | Admitting: Orthopaedic Surgery

## 2017-12-25 NOTE — Telephone Encounter (Signed)
Okay to refill but the tramadol and the TIZANIDINE

## 2017-12-25 NOTE — Telephone Encounter (Signed)
Please advise 

## 2017-12-25 NOTE — Telephone Encounter (Signed)
Patient would like RX refill on Tramadol and Tizanidine called in to [facility]   Call patient when called in # [phone]

## 2017-12-25 NOTE — Telephone Encounter (Signed)
Spoke to patient this morning and let her know what Dr. Adele Schilder said, we moved her appointment up to 2/1 and she will discuss with him at that visit.

## 2017-12-27 NOTE — Telephone Encounter (Signed)
I did not fill the Zanaflex because she is taking Klonopin just [employer]

## 2017-12-30 ENCOUNTER — Other Ambulatory Visit (INDEPENDENT_AMBULATORY_CARE_PROVIDER_SITE_OTHER): Payer: Self-pay | Admitting: Orthopaedic Surgery

## 2017-12-31 ENCOUNTER — Ambulatory Visit (INDEPENDENT_AMBULATORY_CARE_PROVIDER_SITE_OTHER): Payer: Medicare Other | Admitting: Physical Medicine and Rehabilitation

## 2017-12-31 ENCOUNTER — Encounter (INDEPENDENT_AMBULATORY_CARE_PROVIDER_SITE_OTHER): Payer: Self-pay | Admitting: Physical Medicine and Rehabilitation

## 2017-12-31 DIAGNOSIS — G8929 Other chronic pain: Secondary | ICD-10-CM

## 2017-12-31 DIAGNOSIS — M5441 Lumbago with sciatica, right side: Secondary | ICD-10-CM | POA: Diagnosis not present

## 2017-12-31 DIAGNOSIS — M5116 Intervertebral disc disorders with radiculopathy, lumbar region: Secondary | ICD-10-CM

## 2017-12-31 DIAGNOSIS — M47816 Spondylosis without myelopathy or radiculopathy, lumbar region: Secondary | ICD-10-CM

## 2017-12-31 NOTE — Progress Notes (Deleted)
Lower back pain- was mostly right sided, but now going across to left side also. Had pain in right hip; had a bursa injection about a month ago, and that helped with right leg pain. Difficulty bending, cleaning the house. This causes pain to increase. Hasn't been able to work since October due to the pain.No numbness or tingling.

## 2018-01-02 ENCOUNTER — Telehealth (INDEPENDENT_AMBULATORY_CARE_PROVIDER_SITE_OTHER): Payer: Self-pay | Admitting: Orthopaedic Surgery

## 2018-01-02 ENCOUNTER — Ambulatory Visit (INDEPENDENT_AMBULATORY_CARE_PROVIDER_SITE_OTHER): Payer: Medicare Other | Admitting: Psychology

## 2018-01-02 DIAGNOSIS — F331 Major depressive disorder, recurrent, moderate: Secondary | ICD-10-CM | POA: Diagnosis not present

## 2018-01-02 NOTE — Telephone Encounter (Signed)
Patient called asking if she could get the Zanaflex filled? CB # [phone]

## 2018-01-02 NOTE — Telephone Encounter (Signed)
Patient aware per behavior health she can not take the zanaflex and her klonopin they give her

## 2018-01-03 ENCOUNTER — Other Ambulatory Visit (INDEPENDENT_AMBULATORY_CARE_PROVIDER_SITE_OTHER): Payer: Self-pay | Admitting: Orthopaedic Surgery

## 2018-01-03 NOTE — Telephone Encounter (Signed)
Ok to rf? 

## 2018-01-03 NOTE — Telephone Encounter (Signed)
Called to pharmacy 

## 2018-01-08 ENCOUNTER — Telehealth (INDEPENDENT_AMBULATORY_CARE_PROVIDER_SITE_OTHER): Payer: Self-pay | Admitting: Physician Assistant

## 2018-01-08 NOTE — Telephone Encounter (Signed)
Allison-pharmacist with [facility] [address]. Called advised the insurance do not pay for (Tizanidine) capsules.  Ebony Hail asked if it can be changed to tablets and put in the patient chart to state tablets only. The number to contact Ebony Hail is [phone]

## 2018-01-08 NOTE — Telephone Encounter (Signed)
Pharmacy aware her behavioral health doc said she can't take this with her klonopin

## 2018-01-13 ENCOUNTER — Ambulatory Visit (INDEPENDENT_AMBULATORY_CARE_PROVIDER_SITE_OTHER): Payer: Medicare Other

## 2018-01-13 ENCOUNTER — Ambulatory Visit (INDEPENDENT_AMBULATORY_CARE_PROVIDER_SITE_OTHER): Payer: Medicare Other | Admitting: Physical Medicine and Rehabilitation

## 2018-01-13 ENCOUNTER — Encounter (INDEPENDENT_AMBULATORY_CARE_PROVIDER_SITE_OTHER): Payer: Self-pay | Admitting: Physical Medicine and Rehabilitation

## 2018-01-13 VITALS — BP 139/66 | HR 107 | Temp 98.0°F

## 2018-01-13 DIAGNOSIS — M47816 Spondylosis without myelopathy or radiculopathy, lumbar region: Secondary | ICD-10-CM

## 2018-01-13 MED ORDER — METHYLPREDNISOLONE ACETATE 80 MG/ML IJ SUSP
80.0000 mg | Freq: Once | INTRAMUSCULAR | Status: AC
  Administered 2018-01-13: 80 mg

## 2018-01-13 NOTE — Progress Notes (Deleted)
Patient is here today for planned bilateral L4-5 facet injection. No change in symptoms. Right=left. No leg pain.

## 2018-01-13 NOTE — Patient Instructions (Signed)

## 2018-01-14 NOTE — Procedures (Signed)
Kendra Simmons is a 67 year old female who comes in today for planned bilateral L4-5 facet joint/medial branch blocks.  Please see our prior evaluation and management note for further details and justification.  Lumbar Diagnostic Facet Joint Nerve Block with Fluoroscopic Guidance   Patient: Kendra Simmons      Date of Birth: [birth year] MRN: [id number] PCP: Donald Prose, MD      Visit Date: 01/13/2018   Universal Protocol:    Date/Time: 01/29/194:55 AM  Consent Given By: the patient  Position: PRONE  Additional Comments: Vital signs were monitored before and after the procedure. Patient was prepped and draped in the usual sterile fashion. The correct patient, procedure, and site was verified.   Injection Procedure Details:  Procedure Site One Meds Administered:  Meds ordered this encounter  Medications  . methylPREDNISolone acetate (DEPO-MEDROL) injection 80 mg     Laterality: Bilateral  Location/Site:  L4-L5  Needle size: 22 ga.  Needle type:spinal  Needle Placement: Oblique pedical  Findings:   -Comments: There was excellent flow of contrast along the articular pillars without intravascular flow.  Procedure Details: The fluoroscope beam is vertically oriented in AP and then obliqued 15 to 20 degrees to the ipsilateral side of the desired nerve to achieve the "Scotty dog" appearance.  The skin over the target area of the junction of the superior articulating process and the transverse process (sacral ala if blocking the L5 dorsal rami) was locally anesthetized with a 1 ml volume of 1% Lidocaine without Epinephrine.  The spinal needle was inserted and advanced in a trajectory view down to the target.   After contact with periosteum and negative aspirate for blood and CSF, correct placement without intravascular or epidural spread was confirmed by injecting 0.5 ml. of Isovue-250.  A spot radiograph was obtained of this image.    Next, a 0.5 ml. volume of the injectate  described above was injected. The needle was then redirected to the other facet joint nerves mentioned above if needed.  Prior to the procedure, the patient was given a Pain Diary which was completed for baseline measurements.  After the procedure, the patient rated their pain every 30 minutes and will continue rating at this frequency for a total of 5 hours.  The patient has been asked to complete the Diary and return to [place] by mail, fax or hand delivered as soon as possible.   Additional Comments:  The patient tolerated the procedure well Dressing: Band-Aid    Post-procedure details: Patient was observed during the procedure. Post-procedure instructions were reviewed.  Patient left the clinic in stable condition.  Pertinent Imaging: MRI LUMBAR SPINE WITHOUT CONTRAST  TECHNIQUE: Multiplanar, multisequence MR imaging of the lumbar spine was performed. No intravenous contrast was administered.  COMPARISON:  Radiography 07/01/2017  FINDINGS: Segmentation:  5 lumbar type vertebral bodies.  Alignment:  Normal  Vertebrae: Old superior endplate compression deformity at T11 which is healed. Old superior endplate Schmorl's node T12. Few scattered Schmorl's nodes elsewhere in the lumbar region. No acute bone finding.  Conus medullaris and cauda equina: Conus extends to the L2 level. Conus and cauda equina appear normal.  Paraspinal and other soft tissues: Negative  Disc levels:  L3-4 and above:  Noncompressive minimal disc bulges.  No stenosis.  L4-5: Shallow disc protrusion with slight upward migration in the midline behind the inferior endplate of L4. Mild facet and ligamentous hypertrophy. Slight indentation of the thecal sac but no compressive stenosis.  L5-S1: Noncompressive disc bulge. No stenosis.  No facet arthropathy.  IMPRESSION: L4-5: Shallow disc protrusion with slight upward migration in the midline behind L4 inferior endplate. Slight indentation of  the thecal sac but no apparent compressive stenosis. Mild facet and ligamentous hypertrophy at this level as well. No distinct cause of the presenting symptoms is identified.  Mild, non-compressive disc bulges L1-2, L2-3, L3-4 and L5-S1.   Electronically Signed   By: Nelson Chimes M.D.   On: 11/23/2017 08:46

## 2018-01-16 ENCOUNTER — Ambulatory Visit (INDEPENDENT_AMBULATORY_CARE_PROVIDER_SITE_OTHER): Payer: Medicare Other | Admitting: Psychology

## 2018-01-16 DIAGNOSIS — F331 Major depressive disorder, recurrent, moderate: Secondary | ICD-10-CM | POA: Diagnosis not present

## 2018-01-17 ENCOUNTER — Encounter (HOSPITAL_COMMUNITY): Payer: Self-pay | Admitting: Psychiatry

## 2018-01-17 ENCOUNTER — Ambulatory Visit (INDEPENDENT_AMBULATORY_CARE_PROVIDER_SITE_OTHER): Payer: Medicare Other | Admitting: Psychiatry

## 2018-01-17 DIAGNOSIS — Z638 Other specified problems related to primary support group: Secondary | ICD-10-CM | POA: Diagnosis not present

## 2018-01-17 DIAGNOSIS — G894 Chronic pain syndrome: Secondary | ICD-10-CM | POA: Diagnosis not present

## 2018-01-17 DIAGNOSIS — F330 Major depressive disorder, recurrent, mild: Secondary | ICD-10-CM

## 2018-01-17 DIAGNOSIS — Z811 Family history of alcohol abuse and dependence: Secondary | ICD-10-CM

## 2018-01-17 DIAGNOSIS — G4700 Insomnia, unspecified: Secondary | ICD-10-CM

## 2018-01-17 DIAGNOSIS — F419 Anxiety disorder, unspecified: Secondary | ICD-10-CM

## 2018-01-17 MED ORDER — PREGABALIN 75 MG PO CAPS: 75.0000 mg | ORAL_CAPSULE | Freq: Every day | ORAL | 0 refills | Status: DC

## 2018-01-17 MED ORDER — CLONAZEPAM 0.5 MG PO TABS: 0.5000 mg | ORAL_TABLET | Freq: Every day | ORAL | 2 refills | Status: DC

## 2018-01-17 MED ORDER — DOXEPIN HCL 50 MG PO CAPS: 50.0000 mg | ORAL_CAPSULE | Freq: Every day | ORAL | 0 refills | Status: DC

## 2018-01-17 MED ORDER — BUPROPION HCL ER (XL) 150 MG PO TB24: 150.0000 mg | ORAL_TABLET | ORAL | 0 refills | Status: DC

## 2018-01-17 MED ORDER — QUETIAPINE FUMARATE 400 MG PO TABS: 400.0000 mg | ORAL_TABLET | Freq: Every day | ORAL | 0 refills | Status: DC

## 2018-01-17 NOTE — Progress Notes (Signed)
BH MD/PA/NP OP Progress Note  01/17/2018 10:13 AM Kendra Simmons  MRN:  [id number]  Chief Complaint: I am doing fine.  Christmas was tough because my family abandoned me.  I started seeing a therapist.  It is helping me.  HPI: Patient came for her follow-up appointment.  She admitted Christmas was tough because her family abandoned her.  She admitted sometimes sadness and feeling lonely.  She continued to endorse chronic insomnia and despite taking multiple medication she feels that she did not slept well.  On her last visit we increase doxepin and she feels that helps her depression.  Finally she agreed to see a therapist and she is seeing Dr. Marlowe Sax for CBT.  She feels that is working very well.  She has no side effects from the medication.  She denies any agitation, anger, mania, psychosis.  Her appetite is okay.  She really like Lyrica and doxepin which helps her chronic pain and anxiety.  Patient denies any crying spells or any feeling of hopelessness or worthlessness.  She denies any suicidal thoughts.  Her energy level is improved from the past.  Patient denies drinking alcohol or using any illegal substances.  Visit Diagnosis:    ICD-10-CM   1. Major depressive disorder, recurrent episode, mild (HCC) F33.0 doxepin (SINEQUAN) 50 MG capsule    clonazePAM (KLONOPIN) 0.5 MG tablet    buPROPion (WELLBUTRIN XL) 150 MG 24 hr tablet    QUEtiapine (SEROQUEL) 400 MG tablet    pregabalin (LYRICA) 75 MG capsule    DISCONTINUED: pregabalin (LYRICA) 75 MG capsule    Past Psychiatric History: Reviewed. Patient has multiple hospitalization. Her last hospitalization was in June 2016. She has done multiple intensive outpatient program. She has history of hallucination, paranoia, severe depression. In the past she had seen [name], [name] and Dr. Thurmond Butts. She has taken Xanax, amitriptyline, imipramine, temazepam, trazodone, Zyprexa, Luvox, Wellbutrin, Prozac, Vistaril, Remeronand  Bellsomra.  Past Medical History:  Past Medical History:  Diagnosis Date  . Anxiety   . Arthritis   . Depression   . Emotional depression 02/04/2015  . GERD (gastroesophageal reflux disease)   . High cholesterol   . Insomnia   . Memory loss     Past Surgical History:  Procedure Laterality Date  . CESAREAN SECTION    . COSMETIC SURGERY    . ESOPHAGEAL MANOMETRY N/A 09/26/2016   Procedure: ESOPHAGEAL MANOMETRY (EM);  Surgeon: Clarene Essex, MD;  Location: [facility];  Service: Endoscopy;  Laterality: N/A;  . laproscopy      Family Psychiatric History: Viewed.  Family History:  Family History  Problem Relation Age of Onset  . Sleep apnea Father   . Alcohol abuse Father   . Diabetes Mother   . Diabetes Sister   . Diabetes Maternal Uncle   . Diabetes Cousin     Social History:  Social History   Socioeconomic History  . Marital status: Divorced    Spouse name: Not on file  . Number of children: 3  . Years of education: 38  . Highest education level: Not on file  Social Needs  . Financial resource strain: Not on file  . Food insecurity - worry: Not on file  . Food insecurity - inability: Not on file  . Transportation needs - medical: Not on file  . Transportation needs - non-medical: Not on file  Occupational History  . Occupation: Retired  Tobacco Use  . Smoking status: Never Smoker  . Smokeless tobacco: Never  Used  Substance and Sexual Activity  . Alcohol use: No    Alcohol/week: 0.0 oz    Comment: zero  . Drug use: No    Comment: Denies any drug use other than benzos  . Sexual activity: No    Birth control/protection: Abstinence  Other Topics Concern  . Not on file  Social History Narrative   Patient lives at home alone.   Caffeine Use:  2 cokes daily   Sister lives next door.    Allergies: No Known Allergies  Metabolic Disorder Labs: Lab Results  Component Value Date   HGBA1C 5.8 (H) 11/04/2014   MPG 120 (H) 11/04/2014   MPG 134 (H)  07/13/2014   Lab Results  Component Value Date   PROLACTIN 50.3 (H) 05/25/2015   Lab Results  Component Value Date   CHOL 177 11/04/2014   TRIG 79 11/04/2014   HDL 45 11/04/2014   CHOLHDL 3.9 11/04/2014   VLDL 16 11/04/2014   LDLCALC 116 (H) 11/04/2014   LDLCALC 71 07/20/2014   Lab Results  Component Value Date   TSH 1.523 07/04/2015   TSH 2.704 05/29/2015    Therapeutic Level Labs: No results found for: LITHIUM No results found for: VALPROATE No components found for:  CBMZ  Current Medications: Current Outpatient Medications  Medication Sig Dispense Refill  . buPROPion (WELLBUTRIN XL) 150 MG 24 hr tablet Take 1 tablet (150 mg total) every morning by mouth. 90 tablet 0  . clonazePAM (KLONOPIN) 0.5 MG tablet Take 1 tablet (0.5 mg total) at bedtime by mouth. 30 tablet 1  . [facility] VITAMIN B-6 100 MG tablet 1 TABLET BY MOUTH TWICE DAILY  2  . diclofenac sodium (VOLTAREN) 1 % GEL Apply 2 g topically 4 (four) times daily. 100 g 3  . doxepin (SINEQUAN) 50 MG capsule Take 1 capsule (50 mg total) at bedtime by mouth. 90 capsule 0  . HYDROcodone-acetaminophen (NORCO) 5-325 MG tablet Take 1 tablet by mouth every 6 (six) hours as needed for moderate pain. One to two tabs every 4-6 hours for pain (Patient not taking: Reported on 01/13/2018) 15 tablet 0  . ibuprofen (ADVIL,MOTRIN) 200 MG tablet Take 400 mg by mouth 2 (two) times daily.    [name] linaclotide (LINZESS) 145 MCG CAPS capsule Take 145 mcg by mouth every other day.    . meloxicam (MOBIC) 7.5 MG tablet TAKE 1 TABLET BY MOUTH TWICE A DAY 60 tablet 3  . memantine (NAMENDA) 10 MG tablet Take 1 tablet (10 mg total) by mouth 2 (two) times daily. 60 tablet 12  . omeprazole (PRILOSEC) 40 MG capsule 40 mg.  3  . pravastatin (PRAVACHOL) 20 MG tablet Take 1 tablet (20 mg total) by mouth at bedtime. For high cholesterol    . pregabalin (LYRICA) 75 MG capsule Take 1 capsule (75 mg total) at bedtime by mouth. 90 capsule 0  . QUEtiapine (SEROQUEL)  400 MG tablet Take 1 tablet (400 mg total) at bedtime by mouth. 90 tablet 0  . tamsulosin (FLOMAX) 0.4 MG CAPS capsule Take 1 capsule (0.4 mg total) by mouth daily after supper. For frequent urination/urgency 15 capsule 0  . tiZANidine (ZANAFLEX) 4 MG capsule Take 1 capsule (4 mg total) by mouth 3 (three) times daily. (Patient not taking: Reported on 01/13/2018) 30 capsule 0  . tiZANidine (ZANAFLEX) 4 MG tablet Take 1 tablet (4 mg total) by mouth every 6 (six) hours as needed for muscle spasms. (Patient not taking: Reported on 01/13/2018) 30 tablet  0  . traMADol (ULTRAM) 50 MG tablet TAKE 1 TO 2 TABLETS UP TO 3 TIMES A DAY AS NEEDED FOR PAIN 60 tablet 0  . traMADol (ULTRAM) 50 MG tablet Take 1 tablet (50 mg total) by mouth every 6 (six) hours as needed. 30 tablet 0  . traMADol (ULTRAM) 50 MG tablet TAKE 1 TABLET EVERY 6 HOURS AS NEEDED FOR PAIN 30 tablet 0   No current facility-administered medications for this visit.      Musculoskeletal: Strength & Muscle Tone: within normal limits Gait & Station: normal Patient leans: N/A  Psychiatric Specialty Exam: ROS  Blood pressure (!) 144/90, pulse 88, height 5\' 4"  (1.626 m), weight 211 lb 3.2 oz (95.8 kg).There is no height or weight on file to calculate BMI.  General Appearance: Casual  Eye Contact:  Good  Speech:  Clear and Coherent  Volume:  Normal  Mood:  Dysphoric  Affect:  Constricted  Thought Process:  Goal Directed  Orientation:  Full (Time, Place, and Person)  Thought Content: Rumination   Suicidal Thoughts:  No  Homicidal Thoughts:  No  Memory:  Immediate;   Good Recent;   Good Remote;   Good  Judgement:  Good  Insight:  Good  Psychomotor Activity:  Normal  Concentration:  Concentration: Good and Attention Span: Good  Recall:  Good  Fund of Knowledge: Good  Language: Good  Akathisia:  No  Handed:  Right  AIMS (if indicated): not done  Assets:  Communication Skills Desire for Improvement Housing  ADL's:  Intact   Cognition: WNL  Sleep:  Fair   Screenings: AIMS     ED to Hosp-Admission (Discharged) from 05/24/2015 in [place] Total Score  0    AUDIT     ED to Hosp-Admission (Discharged) from 05/24/2015 in [place] [facility] Admission (Discharged) from 12/13/2014 in [place] [facility] Admission (Discharged) from 11/02/2014 in [place] [facility] Admission (Discharged) from 07/11/2014 in [place] [facility]  Alcohol Use Disorder Identification Test Final Score (AUDIT)  0  0  0  0    Mini-Mental     Office Visit from 11/16/2015 in [facility] Office Visit from 03/10/2015 in [facility]  Total Score (max 30 points )  24  21    PHQ2-9     Counselor from 09/12/2015 in [place]  PHQ-2 Total Score  2  PHQ-9 Total Score  4       Assessment and Plan: Major depressive disorder, recurrent.  Anxiety disorder NOS.  Chronic insomnia.  Patient started seeing a therapist which I believe will help her coping skills.  She has issues with the family members.  She is taking 3 antidepressant and so far tolerating very well.  She has no tremors or shakes.  We discussed chronic insomnia.  She is taking Lyrica, Wellbutrin, Klonopin, doxepin, Seroquel and sometime muscle relaxant.  I will not increase or add any more medication.  Discussed polypharmacy.  Patient agreed with the plan.  Discussed medication side effect especially hypnotics and benzodiazepine dependence tolerance and withdrawal.  Continue Lyrica 75 mg at bedtime, doxepin 50 mg at bedtime, Wellbutrin XL 150 mg daily, Seroquel 400 mg at bedtime and Klonopin 0.5 mg at bedtime.  Strongly encouraged to keep appointment with a therapist for CBT.  Recommended to call us back if she has any question or any concern.  Discussed sleep hygiene and healthy  lifestyle.  Follow-up in 3 months.     [name], MD 01/17/2018, 10:13 AM

## 2018-01-23 ENCOUNTER — Ambulatory Visit (INDEPENDENT_AMBULATORY_CARE_PROVIDER_SITE_OTHER): Payer: Medicare Other | Admitting: Psychology

## 2018-01-23 DIAGNOSIS — F331 Major depressive disorder, recurrent, moderate: Secondary | ICD-10-CM

## 2018-01-29 ENCOUNTER — Ambulatory Visit (HOSPITAL_COMMUNITY): Payer: Self-pay | Admitting: Psychiatry

## 2018-01-30 ENCOUNTER — Ambulatory Visit (INDEPENDENT_AMBULATORY_CARE_PROVIDER_SITE_OTHER): Payer: Medicare Other | Admitting: Psychology

## 2018-01-30 DIAGNOSIS — F331 Major depressive disorder, recurrent, moderate: Secondary | ICD-10-CM | POA: Diagnosis not present

## 2018-02-03 ENCOUNTER — Encounter (INDEPENDENT_AMBULATORY_CARE_PROVIDER_SITE_OTHER): Payer: Self-pay | Admitting: Physical Medicine and Rehabilitation

## 2018-02-03 NOTE — Progress Notes (Signed)
Kendra Simmons - 67 y.o. female MRN [id number]  Date of birth: [birth year]  Office Visit Note: Visit Date: 12/31/2017 PCP: Donald Prose, MD Referred by: Donald Prose, MD  Subjective: Chief Complaint  Patient presents with  . Lower Back - Pain  . Right Hip - Pain   HPI: Kendra Simmons is a 67 year old female who is followed by Dr. Ninfa Linden and Benita Stabile.  She comes in today for evaluation and management of her low back and right hip pain.  Benita Stabile, P.A.-C has given her greater trochanteric injection that gave her some relief of her hip pain but did seem to help for a little while.  She is reporting mostly lower back pain which is worse with bending and cleaning the house.  Anything like that causes her pain to increase.  She reports not being able to work since October due to the pain that she is having.  She has pain across the low back which is been mostly right-sided but can occur on the left as well and has been worsening on the left.  She had no specific injury.  She has had no prior lumbar surgery.  She is tried medication management with anti-inflammatory as well as tramadol and hydrocodone.  There is been some issue with her Zanaflex concurrent use of Klonopin.  It is seen through behavioral health with a history of depression.  He also is severely obese and is seeking treatment for that as well.  She denies any specific groin pain.  They have looked at her hips.  She did have an MRI of the lumbar spine performed and is here really to review that and try to see if there is any interventional procedure that may help her.  Dr. Ninfa Linden and Artis Delay had suggested possible epidural injection.    Review of Systems  Constitutional: Negative for chills, fever, malaise/fatigue and weight loss.  HENT: Negative for hearing loss and sinus pain.   Eyes: Negative for blurred vision, double vision and photophobia.  Respiratory: Negative for cough and shortness of breath.   Cardiovascular: Negative for  chest pain, palpitations and leg swelling.  Gastrointestinal: Negative for abdominal pain, nausea and vomiting.  Genitourinary: Negative for flank pain.  Musculoskeletal: Positive for back pain and joint pain. Negative for myalgias.  Skin: Negative for itching and rash.  Neurological: Negative for tremors, focal weakness and weakness.  Endo/Heme/Allergies: Negative.   Psychiatric/Behavioral: Negative for depression.  All other systems reviewed and are negative.  Otherwise per HPI.  Assessment & Plan: Visit Diagnoses:  1. Spondylosis without myelopathy or radiculopathy, lumbar region   2. Radiculopathy due to lumbar intervertebral disc disorder   3. Chronic bilateral low back pain with right-sided sciatica     Plan: Findings:  Clinically she has sort of a mixed picture of mechanical low back pain worse with standing and bending and going from a forward flexion to extension which seems to be facet mediated type pain.  Reviewing the MRI with her I believe there is facet hypertrophy a little bit more than the radiologist calls out on this report.  Is clearly not severe but it is at least moderate.  There is a disc protrusion here with some migration but no focal compression or lateral recess narrowing.  If she could be getting symptoms though from the disc itself.  This is somewhat rare but we could see that especially with weight.  I think this first approach would be diagnostic and hopefully therapeutic facet joint blocks  bilaterally.  Depending on when we see her we could look at right versus left depending on the amount of pain that she is having.  We likely could look at radiofrequency ablation if this was successful and we have to go down that algorithm to get those approved.  With her body habitus it would be difficult but I do think he could do radiofrequency ablation.  Conversely if she does not get much relief we could look at epidural injection.  She should continue with weight loss efforts  as well as core strengthening.  We will see her back for facet joint blocks.    Meds & Orders: No orders of the defined types were placed in this encounter.  No orders of the defined types were placed in this encounter.   Follow-up: Return for Bilateral L4-5 facet joint blocks..   Procedures: No procedures performed  No notes on file   Clinical History: MRI LUMBAR SPINE WITHOUT CONTRAST  TECHNIQUE: Multiplanar, multisequence MR imaging of the lumbar spine was performed. No intravenous contrast was administered.  COMPARISON:  Radiography 07/01/2017  FINDINGS: Segmentation:  5 lumbar type vertebral bodies.  Alignment:  Normal  Vertebrae: Old superior endplate compression deformity at T11 which is healed. Old superior endplate Schmorl's node T12. Few scattered Schmorl's nodes elsewhere in the lumbar region. No acute bone finding.  Conus medullaris and cauda equina: Conus extends to the L2 level. Conus and cauda equina appear normal.  Paraspinal and other soft tissues: Negative  Disc levels:  L3-4 and above:  Noncompressive minimal disc bulges.  No stenosis.  L4-5: Shallow disc protrusion with slight upward migration in the midline behind the inferior endplate of L4. Mild facet and ligamentous hypertrophy. Slight indentation of the thecal sac but no compressive stenosis.  L5-S1: Noncompressive disc bulge. No stenosis. No facet arthropathy.  IMPRESSION: L4-5: Shallow disc protrusion with slight upward migration in the midline behind L4 inferior endplate. Slight indentation of the thecal sac but no apparent compressive stenosis. Mild facet and ligamentous hypertrophy at this level as well. No distinct cause of the presenting symptoms is identified.  Mild, non-compressive disc bulges L1-2, L2-3, L3-4 and L5-S1.   Electronically Signed   By: Nelson Chimes M.D.   On: 11/23/2017 08:46  She reports that  has never smoked. she has never used smokeless  tobacco. No results for input(s): HGBA1C, LABURIC in the last 8760 hours.  Objective:  VS:  HT:    WT:   BMI:     BP:   HR: bpm  TEMP: ( )  RESP:  Physical Exam  Constitutional: She is oriented to person, place, and time. She appears well-developed and well-nourished.  Obese  Eyes: Conjunctivae and EOM are normal. Pupils are equal, round, and reactive to light.  Cardiovascular: Normal rate and intact distal pulses.  Pulmonary/Chest: Effort normal.  Musculoskeletal:  Patient ambulates without aid.  She is somewhat slow to rise from a seated concordant pain with extension rotation of the lumbar spine.  No pain with hip rotation and good distal strength.  She is somewhat tender over the right side and iliotibial band.  Neurological: She is alert and oriented to person, place, and time. She exhibits normal muscle tone.  Skin: Skin is warm and dry. No rash noted. No erythema.  Psychiatric: She has a normal mood and affect. Her behavior is normal.  Nursing note and vitals reviewed.   Ortho Exam Imaging: No results found.  Past Medical/Family/Surgical/Social History: Medications & Allergies reviewed  per EMR Patient Active Problem List   Diagnosis Date Noted  . Chronic bilateral low back pain 07/01/2017  . Dehydration 07/03/2015  . UTI (lower urinary tract infection) 07/03/2015  . Hypokalemia 07/03/2015  . AKI (acute kidney injury) ([place]) 07/03/2015  . Tachycardia 05/27/2015  . MDD (major depressive disorder), recurrent, severe, with psychosis ([place]) 05/25/2015  . 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 Insomnia disorder, with non-sleep disorder mental comorbidity, 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  . 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 Snoring 12/24/2014  . Unintended weight loss 12/24/2014  . 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 Panic attacks 07/26/2014  . Thrombocytopenia ([place]) 12/17/2012  . DYSLIPIDEMIA 03/13/2010  . GERD 03/13/2010   Past Medical History:  Diagnosis Date  . Anxiety     . Arthritis   . Depression   . Emotional depression 02/04/2015  . GERD (gastroesophageal reflux disease)   . High cholesterol   . Insomnia   . Memory loss    Family History  Problem Relation Age of Onset  . Sleep apnea Father   . Alcohol abuse Father   . Diabetes Mother   . Diabetes Sister   . Diabetes Maternal Uncle   . Diabetes Cousin    Past Surgical History:  Procedure Laterality Date  . CESAREAN SECTION    . COSMETIC SURGERY    . ESOPHAGEAL MANOMETRY N/A 09/26/2016   Procedure: ESOPHAGEAL MANOMETRY (EM);  Surgeon: Clarene Essex, MD;  Location: [facility];  Service: Endoscopy;  Laterality: N/A;  . laproscopy     Social History   Occupational History  . Occupation: Retired  Tobacco Use  . Smoking status: Never Smoker  . Smokeless tobacco: Never Used  Substance and Sexual Activity  . Alcohol use: No    Alcohol/week: 0.0 oz    Comment: zero  . Drug use: No    Comment: Denies any drug use other than benzos  . Sexual activity: No    Birth control/protection: Abstinence

## 2018-02-10 ENCOUNTER — Telehealth (INDEPENDENT_AMBULATORY_CARE_PROVIDER_SITE_OTHER): Payer: Self-pay | Admitting: Orthopaedic Surgery

## 2018-02-10 MED ORDER — TRAMADOL HCL 50 MG PO TABS: 100.0000 mg | ORAL_TABLET | Freq: Three times a day (TID) | ORAL | 0 refills | Status: DC | PRN

## 2018-02-10 NOTE — Telephone Encounter (Signed)
Please advise 

## 2018-02-10 NOTE — Telephone Encounter (Signed)
Patient requesting RX refill on Tramadol, states the injection with Dr. Ernestina Patches did not help at all. [facility] on [address] is where she wants it sent. # [phone]

## 2018-02-10 NOTE — Telephone Encounter (Signed)
I sent in some Tramadol  

## 2018-02-13 ENCOUNTER — Ambulatory Visit (INDEPENDENT_AMBULATORY_CARE_PROVIDER_SITE_OTHER): Payer: Medicare Other | Admitting: Psychology

## 2018-02-13 DIAGNOSIS — F331 Major depressive disorder, recurrent, moderate: Secondary | ICD-10-CM | POA: Diagnosis not present

## 2018-02-24 ENCOUNTER — Encounter (HOSPITAL_COMMUNITY): Payer: Self-pay

## 2018-02-24 ENCOUNTER — Telehealth (HOSPITAL_COMMUNITY): Payer: Self-pay | Admitting: Psychiatry

## 2018-02-24 NOTE — Telephone Encounter (Signed)
02/24/2018 - Patient came to pick up letter. DL#: [id number]

## 2018-02-27 ENCOUNTER — Ambulatory Visit (INDEPENDENT_AMBULATORY_CARE_PROVIDER_SITE_OTHER): Payer: Medicare Other | Admitting: Psychology

## 2018-02-27 DIAGNOSIS — F331 Major depressive disorder, recurrent, moderate: Secondary | ICD-10-CM

## 2018-03-13 ENCOUNTER — Ambulatory Visit (INDEPENDENT_AMBULATORY_CARE_PROVIDER_SITE_OTHER): Payer: Medicare Other | Admitting: Psychology

## 2018-03-13 DIAGNOSIS — F331 Major depressive disorder, recurrent, moderate: Secondary | ICD-10-CM

## 2018-03-25 ENCOUNTER — Telehealth (INDEPENDENT_AMBULATORY_CARE_PROVIDER_SITE_OTHER): Payer: Self-pay | Admitting: Orthopaedic Surgery

## 2018-03-25 ENCOUNTER — Other Ambulatory Visit (INDEPENDENT_AMBULATORY_CARE_PROVIDER_SITE_OTHER): Payer: Self-pay

## 2018-03-25 MED ORDER — TRAMADOL HCL 50 MG PO TABS: 100.0000 mg | ORAL_TABLET | Freq: Three times a day (TID) | ORAL | 0 refills | Status: DC | PRN

## 2018-03-25 NOTE — Telephone Encounter (Signed)
Ok to refill. Thanks 

## 2018-03-25 NOTE — Telephone Encounter (Signed)
Please advise 

## 2018-03-25 NOTE — Telephone Encounter (Signed)
Patient called asking for a refill on her Tramadol. CB # [phone]

## 2018-03-25 NOTE — Telephone Encounter (Signed)
Rx sent in to pharmacy. 

## 2018-03-27 ENCOUNTER — Ambulatory Visit (INDEPENDENT_AMBULATORY_CARE_PROVIDER_SITE_OTHER): Payer: Medicare Other | Admitting: Psychology

## 2018-03-27 DIAGNOSIS — F331 Major depressive disorder, recurrent, moderate: Secondary | ICD-10-CM

## 2018-04-03 ENCOUNTER — Other Ambulatory Visit (HOSPITAL_COMMUNITY): Payer: Self-pay

## 2018-04-03 NOTE — Progress Notes (Unsigned)
Patient called me about a new prescription given to her by a weight loss doctor. He prescribed Phentermine. Patient said that she feels like she is having a heart attack, or that she is always on the verge of a panic attack. I advised patient to stop this medication, she has a diagnosis of anxiety and Phentermine can make it worse. Patient agreed and will talk to the prescribing doctor.

## 2018-04-09 ENCOUNTER — Other Ambulatory Visit (HOSPITAL_COMMUNITY): Payer: Self-pay | Admitting: Psychiatry

## 2018-04-09 DIAGNOSIS — F330 Major depressive disorder, recurrent, mild: Secondary | ICD-10-CM

## 2018-04-10 ENCOUNTER — Other Ambulatory Visit (INDEPENDENT_AMBULATORY_CARE_PROVIDER_SITE_OTHER): Payer: Self-pay | Admitting: Orthopaedic Surgery

## 2018-04-10 ENCOUNTER — Ambulatory Visit (INDEPENDENT_AMBULATORY_CARE_PROVIDER_SITE_OTHER): Payer: Medicare Other | Admitting: Psychology

## 2018-04-10 DIAGNOSIS — F331 Major depressive disorder, recurrent, moderate: Secondary | ICD-10-CM | POA: Diagnosis not present

## 2018-04-10 NOTE — Telephone Encounter (Signed)
Please advise 

## 2018-04-10 NOTE — Telephone Encounter (Signed)
Tell her that I need to her ween from these meds at this standpoint, because these are even narcotics that need to be stopped at some point.

## 2018-04-14 ENCOUNTER — Other Ambulatory Visit (INDEPENDENT_AMBULATORY_CARE_PROVIDER_SITE_OTHER): Payer: Self-pay | Admitting: Orthopaedic Surgery

## 2018-04-14 NOTE — Telephone Encounter (Signed)
Please advise 

## 2018-04-16 ENCOUNTER — Encounter (HOSPITAL_COMMUNITY): Payer: Self-pay | Admitting: Psychiatry

## 2018-04-16 ENCOUNTER — Other Ambulatory Visit (HOSPITAL_COMMUNITY): Payer: Self-pay | Admitting: Psychiatry

## 2018-04-16 ENCOUNTER — Ambulatory Visit (INDEPENDENT_AMBULATORY_CARE_PROVIDER_SITE_OTHER): Payer: Medicare Other | Admitting: Psychiatry

## 2018-04-16 VITALS — Wt 204.0 lb

## 2018-04-16 DIAGNOSIS — Z638 Other specified problems related to primary support group: Secondary | ICD-10-CM | POA: Diagnosis not present

## 2018-04-16 DIAGNOSIS — Z79899 Other long term (current) drug therapy: Secondary | ICD-10-CM | POA: Diagnosis not present

## 2018-04-16 DIAGNOSIS — F330 Major depressive disorder, recurrent, mild: Secondary | ICD-10-CM

## 2018-04-16 DIAGNOSIS — Z811 Family history of alcohol abuse and dependence: Secondary | ICD-10-CM | POA: Diagnosis not present

## 2018-04-16 DIAGNOSIS — Z8489 Family history of other specified conditions: Secondary | ICD-10-CM | POA: Diagnosis not present

## 2018-04-16 DIAGNOSIS — F411 Generalized anxiety disorder: Secondary | ICD-10-CM

## 2018-04-16 DIAGNOSIS — F5104 Psychophysiologic insomnia: Secondary | ICD-10-CM

## 2018-04-16 MED ORDER — DOXEPIN HCL 75 MG PO CAPS
75.0000 mg | ORAL_CAPSULE | Freq: Every day | ORAL | 0 refills | Status: DC
Start: 2018-04-16 — End: 2018-07-03

## 2018-04-16 MED ORDER — BUPROPION HCL ER (XL) 150 MG PO TB24: 150.0000 mg | ORAL_TABLET | ORAL | 0 refills | Status: DC

## 2018-04-16 MED ORDER — QUETIAPINE FUMARATE 400 MG PO TABS: 400.0000 mg | ORAL_TABLET | Freq: Every day | ORAL | 0 refills | Status: DC

## 2018-04-16 MED ORDER — CLONAZEPAM 0.5 MG PO TABS: 0.5000 mg | ORAL_TABLET | Freq: Every day | ORAL | 2 refills | Status: DC

## 2018-04-16 MED ORDER — PREGABALIN 75 MG PO CAPS: 75.0000 mg | ORAL_CAPSULE | Freq: Every day | ORAL | 0 refills | Status: DC

## 2018-04-16 NOTE — Progress Notes (Signed)
BH MD/PA/NP OP Progress Note  04/16/2018 12:29 PM Kendra Simmons  MRN:  [id number]  Chief Complaint: I am taking my medication.  Some days I have anxiety and difficulty falling asleep.  My family is causing a lot of issues for me.  HPI: Kendra Simmons came for her follow-up appointment.  She is taking her medication but there are days that she feels very nervous and anxious.  She also endorsed there are nights that she has difficulty falling and staying asleep.  She is very upset with her sisters and family members.  Patient finally decided to get legal help and she hired Forensic psychologist.  Patient told her sister invading her property and approach her son who lives in [place] to allow to have a driveway and garden inpatients property.  Patient became very upset and she signed a legal notice to her sister.  Patient hoping this issue will resolve soon.  Overall she describes her mood is a stable.  She denies any mania, psychosis, hallucination.  She denies any major panic attack.  She recently seen her primary care physician Dr. Nancy Fetter and she had blood work.  She is seeing her therapist Marlowe Sax once a week for CBT.  Patient has no tremors shakes or any EPS.  She has no serotonin syndrome.  She is taking Lyrica doxepin Wellbutrin and Klonopin.  She denies any crying spells.  Sometimes she feels lack of support when she is resilience and like to fight for her rights.  Patient denies drinking or using any illegal substances.  She is happy that she lost weight and she cut down soda and sweets.  She is walking every day.  Really her primary care physician tried her on phentermine but she started to have palpitation and she is stopped.  Visit Diagnosis:    ICD-10-CM   1. GAD (generalized anxiety disorder) F41.1   2. Major depressive disorder, recurrent episode, mild (HCC) F33.0 QUEtiapine (SEROQUEL) 400 MG tablet    pregabalin (LYRICA) 75 MG capsule    doxepin (SINEQUAN) 75 MG capsule    buPROPion (WELLBUTRIN XL) 150 MG 24  hr tablet    clonazePAM (KLONOPIN) 0.5 MG tablet    Past Psychiatric History: Reviewed. Patient has multiple hospitalization. Her last hospitalization was in June 2016. She has done multiple intensive outpatient program. She has history of hallucination, paranoia, severe depression. In the past she had seen [name], [name] and Dr. Thurmond Butts. She has taken Xanax, amitriptyline, imipramine, temazepam, trazodone, Zyprexa, Luvox, Wellbutrin, Prozac, Vistaril, Remeronand Bellsomra.  Past Medical History:  Past Medical History:  Diagnosis Date  . Anxiety   . Arthritis   . Depression   . Emotional depression 02/04/2015  . GERD (gastroesophageal reflux disease)   . High cholesterol   . Insomnia   . Memory loss     Past Surgical History:  Procedure Laterality Date  . CESAREAN SECTION    . COSMETIC SURGERY    . ESOPHAGEAL MANOMETRY N/A 09/26/2016   Procedure: ESOPHAGEAL MANOMETRY (EM);  Surgeon: Clarene Essex, MD;  Location: [facility];  Service: Endoscopy;  Laterality: N/A;  . laproscopy      Family Psychiatric History: Reviewed  Family History:  Family History  Problem Relation Age of Onset  . Sleep apnea Father   . Alcohol abuse Father   . Diabetes Mother   . Diabetes Sister   . Diabetes Maternal Uncle   . Diabetes Cousin     Social History:  Social History   Socioeconomic History  .  Marital status: Divorced    Spouse name: Not on file  . Number of children: 3  . Years of education: 77  . Highest education level: Not on file  Occupational History  . Occupation: Retired  Scientific laboratory technician  . Financial resource strain: Not on file  . Food insecurity:    Worry: Not on file    Inability: Not on file  . Transportation needs:    Medical: Not on file    Non-medical: Not on file  Tobacco Use  . Smoking status: Never Smoker  . Smokeless tobacco: Never Used  Substance and Sexual Activity  . Alcohol use: No    Alcohol/week: 0.0 oz    Comment: zero  . Drug use: No     Types: Barbituates, Benzodiazepines    Comment: Denies any drug use other than benzos  . Sexual activity: Never    Birth control/protection: Abstinence  Lifestyle  . Physical activity:    Days per week: Not on file    Minutes per session: Not on file  . Stress: Not on file  Relationships  . Social connections:    Talks on phone: Not on file    Gets together: Not on file    Attends religious service: Not on file    Active member of club or organization: Not on file    Attends meetings of clubs or organizations: Not on file    Relationship status: Not on file  Other Topics Concern  . Not on file  Social History Narrative   Patient lives at home alone.   Caffeine Use:  2 cokes daily   Sister lives next door.    Allergies: No Known Allergies  Metabolic Disorder Labs: Lab Results  Component Value Date   HGBA1C 5.8 (H) 11/04/2014   MPG 120 (H) 11/04/2014   MPG 134 (H) 07/13/2014   Lab Results  Component Value Date   PROLACTIN 50.3 (H) 05/25/2015   Lab Results  Component Value Date   CHOL 177 11/04/2014   TRIG 79 11/04/2014   HDL 45 11/04/2014   CHOLHDL 3.9 11/04/2014   VLDL 16 11/04/2014   LDLCALC 116 (H) 11/04/2014   LDLCALC 71 07/20/2014   Lab Results  Component Value Date   TSH 1.523 07/04/2015   TSH 2.704 05/29/2015    Therapeutic Level Labs: No results found for: LITHIUM No results found for: VALPROATE No components found for:  CBMZ  Current Medications: Current Outpatient Medications  Medication Sig Dispense Refill  . buPROPion (WELLBUTRIN XL) 150 MG 24 hr tablet Take 1 tablet (150 mg total) by mouth every morning. 90 tablet 0  . clonazePAM (KLONOPIN) 0.5 MG tablet Take 1 tablet (0.5 mg total) by mouth at bedtime. 30 tablet 2  . [facility]S VITAMIN B-6 100 MG tablet 1 TABLET BY MOUTH TWICE DAILY  2  . diclofenac sodium (VOLTAREN) 1 % GEL Apply 2 g topically 4 (four) times daily. 100 g 3  . doxepin (SINEQUAN) 50 MG capsule Take 1 capsule (50 mg total) by  mouth at bedtime. 90 capsule 0  . ibuprofen (ADVIL,MOTRIN) 200 MG tablet Take 400 mg by mouth 2 (two) times daily.    [name] linaclotide (LINZESS) 145 MCG CAPS capsule Take 145 mcg by mouth every other day.    . meloxicam (MOBIC) 7.5 MG tablet TAKE 1 TABLET BY MOUTH TWICE A DAY 60 tablet 3  . memantine (NAMENDA) 10 MG tablet Take 1 tablet (10 mg total) by mouth 2 (two) times daily. [place]  tablet 12  . omeprazole (PRILOSEC) 40 MG capsule 40 mg.  3  . phentermine 15 MG capsule Take 15 mg by mouth daily.  1  . pravastatin (PRAVACHOL) 20 MG tablet Take 1 tablet (20 mg total) by mouth at bedtime. For high cholesterol    . pregabalin (LYRICA) 75 MG capsule Take 1 capsule (75 mg total) by mouth at bedtime. 90 capsule 0  . QUEtiapine (SEROQUEL) 400 MG tablet Take 1 tablet (400 mg total) by mouth at bedtime. 90 tablet 0  . tamsulosin (FLOMAX) 0.4 MG CAPS capsule Take 1 capsule (0.4 mg total) by mouth daily after supper. For frequent urination/urgency 15 capsule 0  . tiZANidine (ZANAFLEX) 4 MG capsule Take 1 capsule (4 mg total) by mouth 3 (three) times daily. (Patient not taking: Reported on 01/13/2018) 30 capsule 0  . traMADol (ULTRAM) 50 MG tablet TAKE 1-2 TABLETS 3 TIMES DAILY AS NEEDED FOR SEVERE PAIN 60 tablet 0   No current facility-administered medications for this visit.      Musculoskeletal: Strength & Muscle Tone: within normal limits Gait & Station: normal Patient leans: N/A  Psychiatric Specialty Exam: ROS  Weight 204 lb (92.5 kg).There is no height or weight on file to calculate BMI.  General Appearance: Casual  Eye Contact:  Good  Speech:  Clear and Coherent  Volume:  Normal  Mood:  Anxious  Affect:  Congruent  Thought Process:  Goal Directed  Orientation:  Full (Time, Place, and Person)  Thought Content: Rumination   Suicidal Thoughts:  No  Homicidal Thoughts:  No  Memory:  Immediate;   Good Recent;   Good Remote;   Good  Judgement:  Good  Insight:  Good  Psychomotor Activity:   Normal  Concentration:  Concentration: Fair and Attention Span: Fair  Recall:  Good  Fund of Knowledge: Good  Language: Good  Akathisia:  No  Handed:  Right  AIMS (if indicated): not done  Assets:  Communication Skills Desire for Improvement Housing  ADL's:  Intact  Cognition: WNL  Sleep:  Fair   Screenings: AIMS     ED to Hosp-Admission (Discharged) from 05/24/2015 in [facility] Total Score  0    AUDIT     ED to Hosp-Admission (Discharged) from 05/24/2015 in [facility] Admission (Discharged) from 12/13/2014 in [facility] Admission (Discharged) from 11/02/2014 in [facility] Admission (Discharged) from 07/11/2014 in [facility]  Alcohol Use Disorder Identification Test Final Score (AUDIT)  0  0  0  0    Mini-Mental     Office Visit from 11/16/2015 in [facility] Office Visit from 03/10/2015 in [facility]  Total Score (max 30 points )  24  21    PHQ2-9     Counselor from 09/12/2015 in [facility]  PHQ-2 Total Score  2  PHQ-9 Total Score  4       Assessment and Plan: Major depressive disorder, recurrent.  Generalized anxiety disorder.  Chronic insomnia.  Discussed psychosocial stressors.  Encouraged to keep therapy with Marlowe Sax for CBT.  Patient insists that she should take a higher dose of doxepin to help her sleep and anxiety.  We discussed polypharmacy.  At this time patient does not have any tremors or shakes.  We will try doxepin 75 mg however if she is started to have dizziness, tremors and shakes  that she need to call us immediately.  Continue Lyrica 75 mg at bedtime, Wellbutrin XL 150 mg daily, Klonopin 0.5 mg at bedtime and Seroquel 400 mg at bedtime.  Recommended to call us back if she has any question or any concern.  Discussed  healthy lifestyle.  Patient has loss weight from the last visit.  Encourage to continue to watch her calorie intake and do regular exercise.  Follow-up in 3 months.     [name], MD 04/16/2018, 12:29 PM

## 2018-04-21 ENCOUNTER — Ambulatory Visit: Payer: Medicare Other | Admitting: Psychology

## 2018-04-23 ENCOUNTER — Ambulatory Visit: Payer: Medicare Other | Admitting: Psychology

## 2018-04-25 ENCOUNTER — Other Ambulatory Visit (HOSPITAL_COMMUNITY): Payer: Self-pay | Admitting: Psychiatry

## 2018-04-25 DIAGNOSIS — F330 Major depressive disorder, recurrent, mild: Secondary | ICD-10-CM

## 2018-05-06 ENCOUNTER — Telehealth (INDEPENDENT_AMBULATORY_CARE_PROVIDER_SITE_OTHER): Payer: Self-pay

## 2018-05-06 ENCOUNTER — Other Ambulatory Visit (INDEPENDENT_AMBULATORY_CARE_PROVIDER_SITE_OTHER): Payer: Self-pay | Admitting: Orthopaedic Surgery

## 2018-05-06 MED ORDER — TRAMADOL HCL 50 MG PO TABS: 50.0000 mg | ORAL_TABLET | Freq: Two times a day (BID) | ORAL | 0 refills | Status: DC | PRN

## 2018-05-06 MED ORDER — TIZANIDINE HCL 4 MG PO CAPS: 4.0000 mg | ORAL_CAPSULE | Freq: Three times a day (TID) | ORAL | 0 refills | Status: DC

## 2018-05-06 NOTE — Telephone Encounter (Signed)
Please advise 

## 2018-05-06 NOTE — Telephone Encounter (Signed)
Patient would like to have a Rx for a muscle relaxer.  Stated that she is in a lot of pain in her back that is radiating into her right hip.  Cb# is [phone].  Please advise.  Thank You.

## 2018-05-06 NOTE — Telephone Encounter (Signed)
I sent in some Zanaflex. 

## 2018-05-06 NOTE — Telephone Encounter (Signed)
Patient called back and stated that she needed a Rx refill for Tramadol.  CB# is [phone].  Please advise.  Thank You.

## 2018-05-07 ENCOUNTER — Encounter (INDEPENDENT_AMBULATORY_CARE_PROVIDER_SITE_OTHER): Payer: Self-pay | Admitting: Orthopaedic Surgery

## 2018-05-07 ENCOUNTER — Ambulatory Visit (INDEPENDENT_AMBULATORY_CARE_PROVIDER_SITE_OTHER): Payer: Medicare Other | Admitting: Orthopaedic Surgery

## 2018-05-07 ENCOUNTER — Ambulatory Visit (INDEPENDENT_AMBULATORY_CARE_PROVIDER_SITE_OTHER): Payer: Medicare Other

## 2018-05-07 DIAGNOSIS — M5441 Lumbago with sciatica, right side: Secondary | ICD-10-CM

## 2018-05-07 DIAGNOSIS — G8929 Other chronic pain: Secondary | ICD-10-CM

## 2018-05-07 NOTE — Progress Notes (Signed)
Patient is a 67 year old female was seen for a long period time for some chronic pain issues.  She has had bilateral L4-L5 facet injections by Dr. Ernestina Patches back in January but has been to physical therapy last summer for her back.  She had a acute flareup of back pain again for about 2 days now.  She has been on Zanaflex and tramadol.  She denies any radicular symptoms of mainly pain in her back.  Occasionally has been on the right side but she does have known bursitis on the right side.  On exam she seems to hurt in the paraspinal muscles throughout her lumbar spine more to the right and left.  Both hips moves fluidly and strong.  She does have positive straight leg raise to the right side.  New x-rays of her lumbar spine were obtained and there is some slight loss of her lordosis and there is moderate arthritic changes in her back but nothing is changed compared to previous films.  At this point I would like to send her to physical therapy and see if they can help calm down the symptoms of her back.  She agrees with trying this.  We will see her back in a month to see how she is doing overall.

## 2018-05-08 ENCOUNTER — Ambulatory Visit (INDEPENDENT_AMBULATORY_CARE_PROVIDER_SITE_OTHER): Payer: Medicare Other | Admitting: Psychology

## 2018-05-08 DIAGNOSIS — F331 Major depressive disorder, recurrent, moderate: Secondary | ICD-10-CM

## 2018-05-09 ENCOUNTER — Encounter (HOSPITAL_COMMUNITY): Payer: Self-pay | Admitting: Emergency Medicine

## 2018-05-09 ENCOUNTER — Ambulatory Visit (HOSPITAL_COMMUNITY)
Admission: EM | Admit: 2018-05-09 | Discharge: 2018-05-09 | Disposition: A | Discharge status: Home / Self Care | Payer: Medicare Other | Attending: Internal Medicine | Admitting: Internal Medicine

## 2018-05-09 ENCOUNTER — Ambulatory Visit (INDEPENDENT_AMBULATORY_CARE_PROVIDER_SITE_OTHER): Payer: Medicare Other

## 2018-05-09 DIAGNOSIS — M25571 Pain in right ankle and joints of right foot: Secondary | ICD-10-CM | POA: Diagnosis not present

## 2018-05-09 DIAGNOSIS — S82831A Other fracture of upper and lower end of right fibula, initial encounter for closed fracture: Secondary | ICD-10-CM | POA: Diagnosis not present

## 2018-05-09 MED ORDER — KETOROLAC TROMETHAMINE 30 MG/ML IJ SOLN
30.0000 mg | Freq: Once | INTRAMUSCULAR | Status: AC
  Administered 2018-05-09: 30 mg via INTRAMUSCULAR

## 2018-05-09 MED ORDER — WALKER GLIDE WHEELS MISC: 0 refills | Status: DC

## 2018-05-09 MED ORDER — KETOROLAC TROMETHAMINE 30 MG/ML IJ SOLN
INTRAMUSCULAR | Status: AC
  Filled 2018-05-09: qty 1

## 2018-05-09 NOTE — Discharge Instructions (Addendum)
Cam walker given Patient declines crutches  Continue conservative management of rest, ice, elevate, and compress Take naproxen or ibuprofen as needed for pain relief (may cause abdominal discomfort, ulcers, and GI bleeds avoid taking with other NSAIDs) Follow up with orthopedist ASAP next week for further evaluation and management (patient already sees an orthopedic specialist) Return or go to the ER if you have any new or worsening symptoms (fever, chills, chest pain, abdominal pain, changes in bowel or bladder habits, pain radiating into lower legs, etc...)

## 2018-05-09 NOTE — ED Provider Notes (Signed)
[place]   [id number] 05/09/18 Arrival Time: 1937  SUBJECTIVE: History from: patient. Kendra Simmons is a 67 y.o. female complains of right ankle pain that started today.  It began after she tripped over her purse while getting out of the car. Localizes the pain to the outside of ankle.  Describes the pain as intermittent and sharp in character.  Has NOT tried OTC medications.  Symptoms are made worse with weight-bearing activities.  Reports hx of fracturing right ankle. Complains of swelling.  Denies fever, chills, erythema, ecchymosis, weakness, numbness and tingling.      ROS: As per HPI.  Past Medical History:  Diagnosis Date  . Anxiety   . Arthritis   . Depression   . Emotional depression 02/04/2015  . GERD (gastroesophageal reflux disease)   . High cholesterol   . Insomnia   . Memory loss    Past Surgical History:  Procedure Laterality Date  . CESAREAN SECTION    . COSMETIC SURGERY    . ESOPHAGEAL MANOMETRY N/A 09/26/2016   Procedure: ESOPHAGEAL MANOMETRY (EM);  Surgeon: Clarene Essex, MD;  Location: [facility];  Service: Endoscopy;  Laterality: N/A;  . laproscopy     No Known Allergies No current facility-administered medications on file prior to encounter.    Current Outpatient Medications on File Prior to Encounter  Medication Sig Dispense Refill  . buPROPion (WELLBUTRIN XL) 150 MG 24 hr tablet Take 1 tablet (150 mg total) by mouth every morning. 90 tablet 0  . clonazePAM (KLONOPIN) 0.5 MG tablet Take 1 tablet (0.5 mg total) by mouth at bedtime. 30 tablet 2  . [facility]S VITAMIN B-6 100 MG tablet 1 TABLET BY MOUTH TWICE DAILY  2  . diclofenac sodium (VOLTAREN) 1 % GEL Apply 2 g topically 4 (four) times daily. 100 g 3  . doxepin (SINEQUAN) 75 MG capsule Take 1 capsule (75 mg total) by mouth at bedtime. 90 capsule 0  . ibuprofen (ADVIL,MOTRIN) 200 MG tablet Take 400 mg by mouth 2 (two) times daily.    [name] linaclotide (LINZESS) 145 MCG CAPS capsule Take 145 mcg by  mouth every other day.    . meloxicam (MOBIC) 7.5 MG tablet TAKE 1 TABLET BY MOUTH TWICE A DAY 60 tablet 3  . memantine (NAMENDA) 10 MG tablet Take 1 tablet (10 mg total) by mouth 2 (two) times daily. 60 tablet 12  . omeprazole (PRILOSEC) 40 MG capsule 40 mg.  3  . pravastatin (PRAVACHOL) 20 MG tablet Take 1 tablet (20 mg total) by mouth at bedtime. For high cholesterol    . pregabalin (LYRICA) 75 MG capsule Take 1 capsule (75 mg total) by mouth at bedtime. 90 capsule 0  . QUEtiapine (SEROQUEL) 400 MG tablet Take 1 tablet (400 mg total) by mouth at bedtime. 90 tablet 0  . tamsulosin (FLOMAX) 0.4 MG CAPS capsule Take 1 capsule (0.4 mg total) by mouth daily after supper. For frequent urination/urgency 15 capsule 0  . tiZANidine (ZANAFLEX) 4 MG capsule Take 1 capsule (4 mg total) by mouth 3 (three) times daily. 30 capsule 0  . traMADol (ULTRAM) 50 MG tablet Take 1-2 tablets (50-100 mg total) by mouth every 12 (twelve) hours as needed. 40 tablet 0   Social History   Socioeconomic History  . Marital status: Divorced    Spouse name: Not on file  . Number of children: 3  . Years of education: 2  . Highest education level: Not on file  Occupational History  .  Occupation: Retired  Scientific laboratory technician  . Financial resource strain: Not on file  . Food insecurity:    Worry: Not on file    Inability: Not on file  . Transportation needs:    Medical: Not on file    Non-medical: Not on file  Tobacco Use  . Smoking status: Never Smoker  . Smokeless tobacco: Never Used  Substance and Sexual Activity  . Alcohol use: No    Alcohol/week: 0.0 oz    Comment: zero  . Drug use: No    Types: Barbituates, Benzodiazepines    Comment: Denies any drug use other than benzos  . Sexual activity: Never    Birth control/protection: Abstinence  Lifestyle  . Physical activity:    Days per week: Not on file    Minutes per session: Not on file  . Stress: Not on file  Relationships  . Social connections:    Talks  on phone: Not on file    Gets together: Not on file    Attends religious service: Not on file    Active member of club or organization: Not on file    Attends meetings of clubs or organizations: Not on file    Relationship status: Not on file  . Intimate partner violence:    Fear of current or ex partner: Not on file    Emotionally abused: Not on file    Physically abused: Not on file    Forced sexual activity: Not on file  Other Topics Concern  . Not on file  Social History Narrative   Patient lives at home alone.   Caffeine Use:  2 cokes daily   Sister lives next door.   Family History  Problem Relation Age of Onset  . Sleep apnea Father   . Alcohol abuse Father   . Diabetes Mother   . Diabetes Sister   . Diabetes Maternal Uncle   . Diabetes Cousin     OBJECTIVE:  Vitals:   05/09/18 1431  BP: (!) 153/76  Pulse: (!) 110  Resp: 18  Temp: 98.2 F (36.8 C)  SpO2: 98%    General appearance: AOx3; in no acute distress.  Head: NCAT Lungs: CTA bilaterally Heart: RRR.  Clear S1 and S2 without murmur, gallops, or rubs.  Radial pulses 2+ bilaterally. Musculoskeletal:  Right ankle Inspection: Skin warm, dry, clear and intact without obvious  erythema, or ecchymosis. Moderate effusion about the lateral aspect of the ankle Palpation: Tender to palpation about the lateral aspect of the ankle ROM: FROM active and passive with mild discomfort Strength:  5/5 knee flexion, 5/5 knee extension, 5/5 dorsiflexion, 5/5 plantar flexion CV: cap refill <2 secs Skin: warm and dry Neurologic: Sitting in wheelchair; Sensation intact about the lower extremities Psychological: alert and cooperative; normal mood and affect  RADIOLOGY:  CLINICAL DATA: Pain following twisting injury  EXAM: RIGHT ANKLE - COMPLETE 3+ VIEW  COMPARISON: None.  FINDINGS: Frontal, oblique, and lateral views obtained. There is marked soft tissue swelling laterally with evident hematoma laterally. There  is an obliquely oriented fracture of the distal fibular diaphysis with alignment essentially anatomic. No other fracture. There is a joint effusion. There is no appreciable joint space narrowing or erosion. The ankle mortise appears intact.  IMPRESSION: Obliquely oriented fracture distal fibula with alignment essentially anatomic. Joint effusion present. Marked soft tissue swelling laterally with lateral hematoma.  These results will be called to the ordering clinician or representative by the Radiologist Assistant, and communication documented in the  PACS or zVision Dashboard.   Electronically Signed By: Lowella Grip III M.D. On: 05/09/2018 15:04  I have reviewed the x-raysand the radiologist interpretation. I am in agreement with the radiologist interpretation.     ASSESSMENT & PLAN:  1. Closed fracture of distal end of right fibula, unspecified fracture morphology, initial encounter       Meds ordered this encounter  Medications  . ketorolac (TORADOL) 30 MG/ML injection 30 mg  . Misc. Devices ([place]) MISC    Sig: Use walker daily to remain non-weight-bearing    Dispense:  1 each    Refill:  0    Order Specific Question:   Supervising Provider    Answer:   Wynona Luna [phone]   Cam walker given Patient declines crutches  Walker prescribed Continue conservative management of rest, ice, elevate, and compress Take naproxen or ibuprofen as needed for pain relief (may cause abdominal discomfort, ulcers, and GI bleeds avoid taking with other NSAIDs) Follow up with ortho ASAP next week for further evaluation and managment Return or go to the ER if you have any new or worsening symptoms (fever, chills, chest pain, abdominal pain, changes in bowel or bladder habits, pain radiating into lower legs, etc...)   Reviewed expectations re: course of current medical issues. Questions answered. Outlined signs and symptoms indicating need for more  acute intervention. Patient verbalized understanding. After Visit Summary given.    Lestine Box, PA-C 05/09/18 1558

## 2018-05-09 NOTE — ED Triage Notes (Signed)
Pt states she tripped over her purse and twisted her R ankle. R ankle is swollen.

## 2018-05-14 ENCOUNTER — Ambulatory Visit (INDEPENDENT_AMBULATORY_CARE_PROVIDER_SITE_OTHER): Payer: Medicare Other | Admitting: Orthopaedic Surgery

## 2018-05-14 ENCOUNTER — Encounter (INDEPENDENT_AMBULATORY_CARE_PROVIDER_SITE_OTHER): Payer: Self-pay | Admitting: Orthopaedic Surgery

## 2018-05-14 DIAGNOSIS — S8264XA Nondisplaced fracture of lateral malleolus of right fibula, initial encounter for closed fracture: Secondary | ICD-10-CM | POA: Diagnosis not present

## 2018-05-14 DIAGNOSIS — S8264XD Nondisplaced fracture of lateral malleolus of right fibula, subsequent encounter for closed fracture with routine healing: Secondary | ICD-10-CM | POA: Insufficient documentation

## 2018-05-14 MED ORDER — HYDROCODONE-ACETAMINOPHEN 5-325 MG PO TABS: 1.0000 | ORAL_TABLET | Freq: Four times a day (QID) | ORAL | 0 refills | Status: DC | PRN

## 2018-05-14 NOTE — Progress Notes (Signed)
Office Visit Note   Patient: Kendra Simmons           Date of Birth: Sep 01, 1951           MRN: [id number] Visit Date: 05/14/2018              Requested by: Donald Prose, MD [address] [place], [place] [address] PCP: Donald Prose, MD   Assessment & Plan: Visit Diagnoses:  1. Closed nondisplaced fracture of lateral malleolus of right fibula, initial encounter     Plan: This is a stable fracture pattern that can be treated with just conservative treatment including a cam walker as well as activity modification and ice and elevation for swelling.  She can attempt weightbearing as tolerated.  All questions concerns were answered and addressed.  We will see her back in 3 weeks with a repeat 3 views of the right ankle.  She was given hydrocodone for pain.  She would like to see if she can get a home health aide at least 3 times a week to help with her at home with activities of daily living.  Follow-Up Instructions: Return in about 3 weeks (around 06/04/2018).   Orders:  No orders of the defined types were placed in this encounter.  No orders of the defined types were placed in this encounter.     Procedures: No procedures performed   Clinical Data: No additional findings.   Subjective: Chief Complaint  Patient presents with  . Right Ankle - Fracture  The patient is only seen before but this is a new issue today.  Last week she was getting out of her car when she tripped over her purse injuring her right ankle.  She went to the [facility] urgent care center and was found to have a nondisplaced right ankle lateral malleolus fracture.  She is placed in a cam walking boot due to the stable nature of this fracture and due to significant soft tissue swelling.  This is her first follow-up since the accident.  She does report significant right ankle pain.  HPI  Review of Systems She denies any headache, chest pain, shortness of breath, fever, chills, nausea,  vomiting.  Objective: Vital Signs: There were no vitals taken for this visit.  Physical Exam She is alert and oriented x3 and in no acute distress. Ortho Exam Examination of her right ankle shows that is well located.  There is significant swelling and bruising laterally.  This limits her range of motion as well as pain limits her range of motion.  Her foot is well-perfused with normal sensation.  There is pain all around the lateral aspect of the ankle and no significant medial tenderness.  She can slightly flex and extend the ankle but its significantly limited due to her swelling and pain. Specialty Comments:  No specialty comments available.  Imaging: No results found. Independent review of x-rays of the right ankle shows 3 views of the ankle.  There is a nondisplaced lateral malleolus fracture at the level of the ankle mortise.  The ankle mortise is well aligned and congruent.  This is a stable fracture pattern.  PMFS History: Patient Active Problem List   Diagnosis Date Noted  . Closed nondisplaced fracture of lateral malleolus of right fibula 05/14/2018  . Chronic bilateral low back pain 07/01/2017  . Dehydration 07/03/2015  . UTI (lower urinary tract infection) 07/03/2015  . Hypokalemia 07/03/2015  . AKI (acute kidney injury) ([place]) 07/03/2015  .  Tachycardia 05/27/2015  . MDD (major depressive disorder), recurrent, severe, with psychosis ([place]) 05/25/2015  . 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 Insomnia disorder, with non-sleep disorder mental comorbidity, 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  . 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 Snoring 12/24/2014  . Unintended weight loss 12/24/2014  . Secondary parkinsonism ([place]) 12/24/2014  . Panic attacks 07/26/2014  . Thrombocytopenia ([place]) 12/17/2012  . DYSLIPIDEMIA 03/13/2010  . GERD 03/13/2010   Past Medical History:  Diagnosis Date  . Anxiety   . Arthritis   . Depression   . Emotional depression 02/04/2015  . GERD (gastroesophageal reflux  disease)   . High cholesterol   . Insomnia   . Memory loss     Family History  Problem Relation Age of Onset  . Sleep apnea Father   . Alcohol abuse Father   . Diabetes Mother   . Diabetes Sister   . Diabetes Maternal Uncle   . Diabetes Cousin     Past Surgical History:  Procedure Laterality Date  . CESAREAN SECTION    . COSMETIC SURGERY    . ESOPHAGEAL MANOMETRY N/A 09/26/2016   Procedure: ESOPHAGEAL MANOMETRY (EM);  Surgeon: Clarene Essex, MD;  Location: [facility];  Service: Endoscopy;  Laterality: N/A;  . laproscopy     Social History   Occupational History  . Occupation: Retired  Tobacco Use  . Smoking status: Never Smoker  . Smokeless tobacco: Never Used  Substance and Sexual Activity  . Alcohol use: No    Alcohol/week: 0.0 oz    Comment: zero  . Drug use: No    Types: Barbituates, Benzodiazepines    Comment: Denies any drug use other than benzos  . Sexual activity: Never    Birth control/protection: Abstinence

## 2018-05-15 ENCOUNTER — Telehealth (INDEPENDENT_AMBULATORY_CARE_PROVIDER_SITE_OTHER): Payer: Self-pay | Admitting: Orthopaedic Surgery

## 2018-05-15 NOTE — Telephone Encounter (Signed)
Pt called would like to check on the status of Milo request

## 2018-05-15 NOTE — Telephone Encounter (Signed)
Patient aware it is in progress

## 2018-05-16 ENCOUNTER — Other Ambulatory Visit (INDEPENDENT_AMBULATORY_CARE_PROVIDER_SITE_OTHER): Payer: Self-pay

## 2018-05-19 ENCOUNTER — Telehealth (INDEPENDENT_AMBULATORY_CARE_PROVIDER_SITE_OTHER): Payer: Self-pay | Admitting: Orthopaedic Surgery

## 2018-05-19 NOTE — Telephone Encounter (Signed)
Kendra Simmons with [facility] is requesting verbal orders for physical therapy  3 x for 1 week 2 x for 3 weeks As well as a social work consult to identify additional community resources for patient  CB # [phone]

## 2018-05-19 NOTE — Telephone Encounter (Signed)
Verbal orders given  

## 2018-05-22 ENCOUNTER — Ambulatory Visit: Payer: Medicare Other | Admitting: Psychology

## 2018-05-23 ENCOUNTER — Telehealth (INDEPENDENT_AMBULATORY_CARE_PROVIDER_SITE_OTHER): Payer: Self-pay | Admitting: Orthopaedic Surgery

## 2018-05-23 NOTE — Telephone Encounter (Signed)
Ann Vern Claude -social worker with [facility] called advised she is faxing over the [employer] 350 form for Dr Ninfa Linden to complete and have faxed back to her so that the patient can have a Home Health Aid come to her home. The number to contact Catalina Lunger  Is [phone]

## 2018-05-23 NOTE — Telephone Encounter (Signed)
Patient called asked if Kendra Simmons can be called for her to get assistance because she live alone and don't have any help around her home. Patient said a [employer] 305 form need to be completed and faxed back to [employer]. Patient asked if [facility] can be called and have the form faxed over to Dr Ninfa Linden to  complete and fax back to them.  The fax# is [phone]  The ph# is [phone]  The number to contact patient is [phone]

## 2018-05-23 NOTE — Telephone Encounter (Signed)
Called patient and left voicemail letting her know that we are actually going through [facility] and we are working on this form with them

## 2018-05-27 NOTE — Telephone Encounter (Signed)
Forms received  Patient aware

## 2018-05-27 NOTE — Telephone Encounter (Signed)
Patient called inquiring about the status of the form that you are working on with [facility]. Patient wants you to give her a call. CB#[phone].  Thank you.

## 2018-05-28 ENCOUNTER — Ambulatory Visit (INDEPENDENT_AMBULATORY_CARE_PROVIDER_SITE_OTHER): Payer: Medicare Other | Admitting: Psychology

## 2018-05-28 DIAGNOSIS — F331 Major depressive disorder, recurrent, moderate: Secondary | ICD-10-CM

## 2018-05-30 ENCOUNTER — Telehealth (INDEPENDENT_AMBULATORY_CARE_PROVIDER_SITE_OTHER): Payer: Self-pay | Admitting: Orthopaedic Surgery

## 2018-05-30 NOTE — Telephone Encounter (Signed)
Please advise 

## 2018-05-30 NOTE — Telephone Encounter (Signed)
Patient left a message stating that she has a fractured foot and wanted to know if Dr. Ninfa Linden would call in an RX for Tramadol and Diclofenac (2Tubes).  CB#[phone].  Thank you.

## 2018-06-02 ENCOUNTER — Other Ambulatory Visit (INDEPENDENT_AMBULATORY_CARE_PROVIDER_SITE_OTHER): Payer: Self-pay | Admitting: Orthopaedic Surgery

## 2018-06-02 ENCOUNTER — Telehealth (INDEPENDENT_AMBULATORY_CARE_PROVIDER_SITE_OTHER): Payer: Self-pay | Admitting: Orthopaedic Surgery

## 2018-06-02 MED ORDER — TRAMADOL HCL 50 MG PO TABS: 50.0000 mg | ORAL_TABLET | Freq: Four times a day (QID) | ORAL | 0 refills | Status: DC | PRN

## 2018-06-02 MED ORDER — DICLOFENAC SODIUM 1 % TD GEL: 2.0000 g | Freq: Four times a day (QID) | TRANSDERMAL | 3 refills | Status: DC

## 2018-06-02 NOTE — Telephone Encounter (Signed)
I see that the Tramadol has already been sent, please disregard the earlier msg, thanks.

## 2018-06-02 NOTE — Telephone Encounter (Signed)
I sent them in. 

## 2018-06-02 NOTE — Telephone Encounter (Signed)
Patient would like a refill on her tramadol to be sent into the pharmacy. CB # [phone]

## 2018-06-02 NOTE — Telephone Encounter (Signed)
Please advise 

## 2018-06-03 ENCOUNTER — Telehealth (INDEPENDENT_AMBULATORY_CARE_PROVIDER_SITE_OTHER): Payer: Self-pay | Admitting: Orthopaedic Surgery

## 2018-06-03 ENCOUNTER — Other Ambulatory Visit (INDEPENDENT_AMBULATORY_CARE_PROVIDER_SITE_OTHER): Payer: Self-pay

## 2018-06-03 MED ORDER — DICLOFENAC SODIUM 1 % TD GEL: 2.0000 g | Freq: Four times a day (QID) | TRANSDERMAL | 3 refills | Status: DC

## 2018-06-03 NOTE — Telephone Encounter (Signed)
Patient called left voicemail message needing Rx refilled (Diclofenac Sodium Topical Gel)  Patient asked if more than one tube can be called in for her. Patient asked [facility] [address]. The number to contact patient is [phone]

## 2018-06-03 NOTE — Telephone Encounter (Signed)
Sent in to pharmacy.  

## 2018-06-04 ENCOUNTER — Encounter (INDEPENDENT_AMBULATORY_CARE_PROVIDER_SITE_OTHER): Payer: Self-pay | Admitting: Orthopaedic Surgery

## 2018-06-04 ENCOUNTER — Ambulatory Visit (INDEPENDENT_AMBULATORY_CARE_PROVIDER_SITE_OTHER): Payer: Medicare Other

## 2018-06-04 ENCOUNTER — Ambulatory Visit (INDEPENDENT_AMBULATORY_CARE_PROVIDER_SITE_OTHER): Payer: Medicare Other | Admitting: Orthopaedic Surgery

## 2018-06-04 DIAGNOSIS — S8264XD Nondisplaced fracture of lateral malleolus of right fibula, subsequent encounter for closed fracture with routine healing: Secondary | ICD-10-CM

## 2018-06-04 MED ORDER — DICLOFENAC SODIUM 1 % TD GEL: 2.0000 g | Freq: Four times a day (QID) | TRANSDERMAL | 3 refills | Status: DC

## 2018-06-04 NOTE — Progress Notes (Signed)
The patient is now 3 and half weeks status post a nondisplaced fracture of her right ankle lateral malleolus.  Is been weightbearing as tolerated in a cam walking boot.  She is having moderate pain but doing well overall.  We will try to send in some tramadol for her and Voltaren gel.  On exam, her right ankle is moving better and she is neurovascularly intact.  There is pain still to be expected over the lateral malleolus but no medial pain.  The ankle is ligamentously stable.  3 views of the right ankle are obtained and show an intact ankle mortise and a nondisplaced lateral malaise fracture with interval healing.  At this point we will transition her to an ASO as comfort allows.  She will still hold out of any high impact aerobic activities.  We will see her back in 4 weeks with a repeat 3 views of her right ankle.

## 2018-06-05 ENCOUNTER — Ambulatory Visit: Payer: Medicare Other | Admitting: Psychology

## 2018-06-05 NOTE — Telephone Encounter (Signed)
Left Leg pain associated with pain shooting down leg

## 2018-06-05 NOTE — Telephone Encounter (Signed)
  Left Leg pain associated with pain shooting down leg       We just saw her yesterday, unsure what advice to give her

## 2018-06-05 NOTE — Telephone Encounter (Signed)
Try just rest and ant-inflammatories

## 2018-06-05 NOTE — Telephone Encounter (Signed)
Patient aware of the below message  

## 2018-06-05 NOTE — Telephone Encounter (Signed)
Pt is in a lot of pain wanted to speak with someone

## 2018-06-09 ENCOUNTER — Ambulatory Visit (INDEPENDENT_AMBULATORY_CARE_PROVIDER_SITE_OTHER): Payer: Medicare Other | Admitting: Orthopaedic Surgery

## 2018-06-16 ENCOUNTER — Ambulatory Visit (INDEPENDENT_AMBULATORY_CARE_PROVIDER_SITE_OTHER): Payer: Medicare Other | Admitting: Physician Assistant

## 2018-06-16 ENCOUNTER — Encounter (INDEPENDENT_AMBULATORY_CARE_PROVIDER_SITE_OTHER): Payer: Self-pay | Admitting: Physician Assistant

## 2018-06-16 DIAGNOSIS — M7062 Trochanteric bursitis, left hip: Secondary | ICD-10-CM

## 2018-06-16 MED ORDER — TIZANIDINE HCL 4 MG PO TABS: 4.0000 mg | ORAL_TABLET | Freq: Every day | ORAL | 0 refills | Status: DC

## 2018-06-16 MED ORDER — METHYLPREDNISOLONE ACETATE 40 MG/ML IJ SUSP
40.0000 mg | INTRAMUSCULAR | Status: AC | PRN
  Administered 2018-06-16: 40 mg via INTRA_ARTICULAR

## 2018-06-16 MED ORDER — LIDOCAINE HCL 1 % IJ SOLN
3.0000 mL | INTRAMUSCULAR | Status: AC | PRN
  Administered 2018-06-16: 3 mL

## 2018-06-16 NOTE — Progress Notes (Signed)
   Procedure Note  Patient: Kendra Simmons             Date of Birth: [birth year]           MRN: [id number]             Visit Date: 06/16/2018 HPI: Kendra Simmons comes in today due tolo left hip pain.  Most her pains in the left hip she lies on her hip at night.  She does have chronic back pain is seen Dr. Ernestina Patches in the past and was given epidural steroid injections which did help.  She is currently being treated for a nondisplaced right lateral malleolus fracture ASO brace she was initially in a cam walker boot.  She is wondering if pain in her left hip is due to being in the cam walker boot for some time.  She does have some pain that radiates down into the calf but again most of her pain is lateral aspect of the hip.  She is using Aleve for pain relief.  She is requesting a refill on her tizanidine.  Physical exam: Left hip good range of motion some discomfort with extremes of external rotation.  She has tenderness over the left trochanteric region.  Tenderness down her IT band to the knee.  Left calf supple.  Procedures: Visit Diagnoses: Trochanteric bursitis, left hip  Large Joint Inj: L greater trochanter on 06/16/2018 4:49 PM Indications: pain Details: 22 G 3.5 in needle, lateral approach  Arthrogram: No  Medications: 3 mL lidocaine 1 %; 40 mg methylPREDNISolone acetate 40 MG/ML Outcome: tolerated well, no immediate complications Procedure, treatment alternatives, risks and benefits explained, specific risks discussed. Consent was given by the patient. Immediately prior to procedure a time out was called to verify the correct patient, procedure, equipment, support staff and site/side marked as required. Patient was prepped and draped in the usual sterile fashion.    Plan: She is given IT band stretching exercises to perform.  We will see her back at her regular scheduled appointment for follow-up of her right ankle lateral malleolus fracture in about 3 weeks.  Refill on her tizanidine is  given.

## 2018-06-23 ENCOUNTER — Ambulatory Visit (INDEPENDENT_AMBULATORY_CARE_PROVIDER_SITE_OTHER): Payer: Medicare Other | Admitting: Psychology

## 2018-06-23 DIAGNOSIS — F331 Major depressive disorder, recurrent, moderate: Secondary | ICD-10-CM | POA: Diagnosis not present

## 2018-06-24 ENCOUNTER — Telehealth (INDEPENDENT_AMBULATORY_CARE_PROVIDER_SITE_OTHER): Payer: Self-pay | Admitting: Orthopaedic Surgery

## 2018-06-24 NOTE — Telephone Encounter (Signed)
Please advise 

## 2018-06-24 NOTE — Telephone Encounter (Signed)
Patient requesting a refill on hydrocodone, states for the last 3-4 days her ankle has been hurting more than before and that she is trying to stay off of it as much as possible.   Patients # [phone]

## 2018-06-25 MED ORDER — HYDROCODONE-ACETAMINOPHEN 5-325 MG PO TABS: 1.0000 | ORAL_TABLET | Freq: Four times a day (QID) | ORAL | 0 refills | Status: DC | PRN

## 2018-06-25 NOTE — Telephone Encounter (Signed)
I sent some in.  Tell her to use it sparingly.

## 2018-06-30 ENCOUNTER — Other Ambulatory Visit (HOSPITAL_COMMUNITY): Payer: Self-pay

## 2018-06-30 DIAGNOSIS — F330 Major depressive disorder, recurrent, mild: Secondary | ICD-10-CM

## 2018-06-30 MED ORDER — QUETIAPINE FUMARATE 400 MG PO TABS
400.0000 mg | ORAL_TABLET | Freq: Every day | ORAL | 0 refills | Status: DC
Start: 2018-06-30 — End: 2018-09-19

## 2018-07-02 ENCOUNTER — Ambulatory Visit (INDEPENDENT_AMBULATORY_CARE_PROVIDER_SITE_OTHER): Payer: Medicare Other

## 2018-07-02 ENCOUNTER — Ambulatory Visit (INDEPENDENT_AMBULATORY_CARE_PROVIDER_SITE_OTHER): Payer: Medicare Other | Admitting: Orthopaedic Surgery

## 2018-07-02 DIAGNOSIS — S8264XD Nondisplaced fracture of lateral malleolus of right fibula, subsequent encounter for closed fracture with routine healing: Secondary | ICD-10-CM

## 2018-07-02 DIAGNOSIS — M7062 Trochanteric bursitis, left hip: Secondary | ICD-10-CM

## 2018-07-02 NOTE — Progress Notes (Signed)
The patient is returning for follow-up to things today.  She recently saw [place] with left hip trochanteric bursitis and we provided her left hip trochanteric injection and it helped her greatly.  She is walking favoring her right side due to a known lateral malleolus fracture is been nondisplaced.  We treated that with an ASO.  She is here today for follow-up of both areas.  She said the left hip is doing well in the right ankle is been sore but she tolerates things better.  She does take an occasional half of hydrocodone.  She said the pain is not as bad as it is been.  On examination of her left hip it is moving better and her pain is much less of the trochanteric area.  Imaging of right ankle show some pain over the lateral malleolus but minimal swelling.  Her ankle range of motion on the right side is full.  The ankle feels ligamentously stable.  3 views of the right ankle obtained in the office today.  The lateral malleolus fracture line can still be seen which is an oblique fracture line but there is been interval healing.  The ankle mortise is intact.  At this point should continue increase activities as comfort allows.  She should continue the ASO.  We will see her back in 6 weeks with a repeat 3 views of the right ankle.

## 2018-07-03 ENCOUNTER — Other Ambulatory Visit (HOSPITAL_COMMUNITY): Payer: Self-pay

## 2018-07-03 ENCOUNTER — Other Ambulatory Visit (HOSPITAL_COMMUNITY): Payer: Self-pay | Admitting: Psychiatry

## 2018-07-03 DIAGNOSIS — F330 Major depressive disorder, recurrent, mild: Secondary | ICD-10-CM

## 2018-07-03 MED ORDER — DOXEPIN HCL 75 MG PO CAPS: 75.0000 mg | ORAL_CAPSULE | Freq: Every day | ORAL | 0 refills | Status: DC

## 2018-07-05 ENCOUNTER — Other Ambulatory Visit (HOSPITAL_COMMUNITY): Payer: Self-pay | Admitting: Psychiatry

## 2018-07-05 DIAGNOSIS — F330 Major depressive disorder, recurrent, mild: Secondary | ICD-10-CM

## 2018-07-10 ENCOUNTER — Other Ambulatory Visit (HOSPITAL_COMMUNITY): Payer: Self-pay | Admitting: Psychiatry

## 2018-07-10 DIAGNOSIS — F330 Major depressive disorder, recurrent, mild: Secondary | ICD-10-CM

## 2018-07-11 ENCOUNTER — Other Ambulatory Visit (HOSPITAL_COMMUNITY): Payer: Self-pay | Admitting: Psychiatry

## 2018-07-11 ENCOUNTER — Other Ambulatory Visit (INDEPENDENT_AMBULATORY_CARE_PROVIDER_SITE_OTHER): Payer: Self-pay | Admitting: Physician Assistant

## 2018-07-11 DIAGNOSIS — F330 Major depressive disorder, recurrent, mild: Secondary | ICD-10-CM

## 2018-07-11 NOTE — Telephone Encounter (Signed)
Please advise 

## 2018-07-14 ENCOUNTER — Telehealth (INDEPENDENT_AMBULATORY_CARE_PROVIDER_SITE_OTHER): Payer: Self-pay | Admitting: Radiology

## 2018-07-14 MED ORDER — TIZANIDINE HCL 4 MG PO CAPS: 4.0000 mg | ORAL_CAPSULE | Freq: Two times a day (BID) | ORAL | 0 refills | Status: DC | PRN

## 2018-07-14 MED ORDER — TRAMADOL HCL 50 MG PO TABS: 50.0000 mg | ORAL_TABLET | Freq: Four times a day (QID) | ORAL | 0 refills | Status: DC | PRN

## 2018-07-14 NOTE — Telephone Encounter (Signed)
I sent some more in for her.

## 2018-07-14 NOTE — Telephone Encounter (Signed)
Please advise 

## 2018-07-14 NOTE — Telephone Encounter (Signed)
Requests refill on tizanidine tablets and tramadol. [facility] [address]  [phone]

## 2018-07-17 ENCOUNTER — Encounter (HOSPITAL_COMMUNITY): Payer: Self-pay | Admitting: Psychiatry

## 2018-07-17 ENCOUNTER — Ambulatory Visit (INDEPENDENT_AMBULATORY_CARE_PROVIDER_SITE_OTHER): Payer: Medicare Other | Admitting: Psychiatry

## 2018-07-17 ENCOUNTER — Ambulatory Visit (HOSPITAL_COMMUNITY): Payer: Medicare Other | Admitting: Psychiatry

## 2018-07-17 DIAGNOSIS — F330 Major depressive disorder, recurrent, mild: Secondary | ICD-10-CM

## 2018-07-17 MED ORDER — PREGABALIN 75 MG PO CAPS: 75.0000 mg | ORAL_CAPSULE | Freq: Every day | ORAL | 0 refills | Status: DC

## 2018-07-17 MED ORDER — CLONAZEPAM 0.5 MG PO TABS: 0.5000 mg | ORAL_TABLET | Freq: Every day | ORAL | 1 refills | Status: DC

## 2018-07-17 MED ORDER — RAMELTEON 8 MG PO TABS: 8.0000 mg | ORAL_TABLET | Freq: Every day | ORAL | 0 refills | Status: DC

## 2018-07-17 NOTE — Progress Notes (Signed)
BH MD/PA/NP OP Progress Note  07/17/2018 1:35 PM Kendra Simmons  MRN:  [id number]  Chief Complaint:  sleep problems  HPI: Kendra Simmons presents with ongoing complaints of external locus of control, complains about her 3 sisters, strained relationship with her son, strained relationship with her daughter, strained relationship with her church, and consistent friendships.  Has little to no insight into how she contributes to these relationship issues, I spent some time trying to unpack what her understanding is related to these damaged relationships.  She has a significant difficulty in thinking about how she could possibly contribute to issues in her relationships.  She was receptive to working on this in therapy with Dr. Marlowe Sax.  With regard to medication management, her primary concern today is related to ongoing insomnia.  I suggested we try her on Rozerem 8 mg nightly and I explained how this works in her brain and sleep center.  She was receptive to continuing her current medication regimen with the addition of Rozerem.  No other acute concerns at this time and we will follow-up with Dr. Adele Schilder in 3 months.  Visit Diagnosis:    ICD-10-CM   1. Major depressive disorder, recurrent episode, mild (HCC) F33.0 clonazePAM (KLONOPIN) 0.5 MG tablet    ramelteon (ROZEREM) 8 MG tablet    pregabalin (LYRICA) 75 MG capsule    Past Psychiatric History: See intake H&P for full details. Reviewed, with no updates at this time.   Past Medical History:  Past Medical History:  Diagnosis Date  . Anxiety   . Arthritis   . Depression   . Emotional depression 02/04/2015  . GERD (gastroesophageal reflux disease)   . High cholesterol   . Insomnia   . Memory loss     Past Surgical History:  Procedure Laterality Date  . CESAREAN SECTION    . COSMETIC SURGERY    . ESOPHAGEAL MANOMETRY N/A 09/26/2016   Procedure: ESOPHAGEAL MANOMETRY (EM);  Surgeon: Clarene Essex, MD;  Location: [facility];   Service: Endoscopy;  Laterality: N/A;  . laproscopy      Family Psychiatric History: See intake H&P for full details. Reviewed, with no updates at this time.   Family History:  Family History  Problem Relation Age of Onset  . Sleep apnea Father   . Alcohol abuse Father   . Diabetes Mother   . Diabetes Sister   . Diabetes Maternal Uncle   . Diabetes Cousin     Social History:  Social History   Socioeconomic History  . Marital status: Divorced    Spouse name: Not on file  . Number of children: 3  . Years of education: 55  . Highest education level: Not on file  Occupational History  . Occupation: Retired  Scientific laboratory technician  . Financial resource strain: Not on file  . Food insecurity:    Worry: Not on file    Inability: Not on file  . Transportation needs:    Medical: Not on file    Non-medical: Not on file  Tobacco Use  . Smoking status: Never Smoker  . Smokeless tobacco: Never Used  Substance and Sexual Activity  . Alcohol use: No    Alcohol/week: 0.0 oz    Comment: zero  . Drug use: No    Types: Barbituates, Benzodiazepines    Comment: Denies any drug use other than benzos  . Sexual activity: Never    Birth control/protection: Abstinence  Lifestyle  . Physical activity:    Days per week:  Not on file    Minutes per session: Not on file  . Stress: Not on file  Relationships  . Social connections:    Talks on phone: Not on file    Gets together: Not on file    Attends religious service: Not on file    Active member of club or organization: Not on file    Attends meetings of clubs or organizations: Not on file    Relationship status: Not on file  Other Topics Concern  . Not on file  Social History Narrative   Patient lives at home alone.   Caffeine Use:  2 cokes daily   Sister lives next door.    Allergies:  Allergies  Allergen Reactions  . Phentermine Anxiety    Metabolic Disorder Labs: Lab Results  Component Value Date   HGBA1C 5.8 (H)  11/04/2014   MPG 120 (H) 11/04/2014   MPG 134 (H) 07/13/2014   Lab Results  Component Value Date   PROLACTIN 50.3 (H) 05/25/2015   Lab Results  Component Value Date   CHOL 177 11/04/2014   TRIG 79 11/04/2014   HDL 45 11/04/2014   CHOLHDL 3.9 11/04/2014   VLDL 16 11/04/2014   LDLCALC 116 (H) 11/04/2014   LDLCALC 71 07/20/2014   Lab Results  Component Value Date   TSH 1.523 07/04/2015   TSH 2.704 05/29/2015    Therapeutic Level Labs: No results found for: LITHIUM No results found for: VALPROATE No components found for:  CBMZ  Current Medications: Current Outpatient Medications  Medication Sig Dispense Refill  . buPROPion (WELLBUTRIN XL) 150 MG 24 hr tablet TAKE 1 TABLET BY MOUTH EVERY DAY IN THE MORNING 90 tablet 0  . clonazePAM (KLONOPIN) 0.5 MG tablet Take 1 tablet (0.5 mg total) by mouth at bedtime. 30 tablet 1  . [facility] VITAMIN B-6 100 MG tablet 1 TABLET BY MOUTH TWICE DAILY  2  . diclofenac sodium (VOLTAREN) 1 % GEL Apply 2 g topically 4 (four) times daily. 100 g 3  . doxepin (SINEQUAN) 75 MG capsule Take 1 capsule (75 mg total) by mouth at bedtime. 90 capsule 0  . HYDROcodone-acetaminophen (NORCO/VICODIN) 5-325 MG tablet Take 1 tablet by mouth every 6 (six) hours as needed for moderate pain. 30 tablet 0  . ibuprofen (ADVIL,MOTRIN) 200 MG tablet Take 400 mg by mouth 2 (two) times daily.    [name] linaclotide (LINZESS) 145 MCG CAPS capsule Take 145 mcg by mouth every other day.    . meloxicam (MOBIC) 7.5 MG tablet TAKE 1 TABLET BY MOUTH TWICE A DAY 60 tablet 3  . memantine (NAMENDA) 10 MG tablet Take 1 tablet (10 mg total) by mouth 2 (two) times daily. 60 tablet 12  . Misc. Devices ([place]) MISC Use walker daily to remain non-weight-bearing 1 each 0  . omeprazole (PRILOSEC) 40 MG capsule 40 mg.  3  . pravastatin (PRAVACHOL) 20 MG tablet Take 1 tablet (20 mg total) by mouth at bedtime. For high cholesterol    . pregabalin (LYRICA) 75 MG capsule Take 1 capsule (75 mg  total) by mouth at bedtime. 90 capsule 0  . QUEtiapine (SEROQUEL) 400 MG tablet Take 1 tablet (400 mg total) by mouth at bedtime. 90 tablet 0  . tamsulosin (FLOMAX) 0.4 MG CAPS capsule Take 1 capsule (0.4 mg total) by mouth daily after supper. For frequent urination/urgency 15 capsule 0  . tiZANidine (ZANAFLEX) 4 MG capsule Take 1 capsule (4 mg total) by mouth 2 (two)  times daily as needed for muscle spasms. 40 capsule 0  . tiZANidine (ZANAFLEX) 4 MG tablet TAKE 1 TABLET (4 MG TOTAL) BY MOUTH AT BEDTIME. 30 tablet 0  . traMADol (ULTRAM) 50 MG tablet Take 1-2 tablets (50-100 mg total) by mouth every 6 (six) hours as needed. 50 tablet 0  . ramelteon (ROZEREM) 8 MG tablet Take 1 tablet (8 mg total) by mouth at bedtime. 90 tablet 0   No current facility-administered medications for this visit.      Musculoskeletal: Strength & Muscle Tone: within normal limits Gait & Station: normal Patient leans: N/A  Psychiatric Specialty Exam: ROS  Blood pressure (!) 146/84, pulse 80, height 5\' 4"  (1.626 m), weight 203 lb (92.1 kg).Body mass index is 34.84 kg/m.  General Appearance: Fairly Groomed and Well Groomed  Eye Contact:  Good  Speech:  Clear and Coherent and Normal Rate  Volume:  Normal  Mood:  Anxious and worrier  Affect:  Appropriate and Congruent  Thought Process:  Coherent and Descriptions of Associations: Intact  Orientation:  Full (Time, Place, and Person)  Thought Content: Logical   Suicidal Thoughts:  No  Homicidal Thoughts:  No  Memory:  Immediate;   Fair  Judgement:  Fair  Insight:  Shallow  Psychomotor Activity:  Normal  Concentration:  Concentration: Fair  Recall:  [employer] of Knowledge: Fair  Language: Fair  Akathisia:  Negative  Handed:  Right  AIMS (if indicated): not done  Assets:  Communication Skills Desire for Improvement Financial Resources/Insurance  ADL's:  Intact  Cognition: WNL  Sleep:  Poor   Screenings: AIMS     ED to Hosp-Admission (Discharged)  from 05/24/2015 in [place] [facility]  AIMS Total Score  0    AUDIT     ED to Hosp-Admission (Discharged) from 05/24/2015 in [place] [facility] Admission (Discharged) from 12/13/2014 in [place] [facility] Admission (Discharged) from 11/02/2014 in [place] [facility] Admission (Discharged) from 07/11/2014 in [place] [facility]  Alcohol Use Disorder Identification Test Final Score (AUDIT)  0  0  0  0    Mini-Mental     Office Visit from 11/16/2015 in [facility] Office Visit from 03/10/2015 in [facility]  Total Score (max 30 points )  24  21    PHQ2-9     Counselor from 09/12/2015 in [place]  PHQ-2 Total Score  2  PHQ-9 Total Score  4       Assessment and Plan:  Kendra Simmons presents with relatively stable mood, ongoing difficulties with sleep and insomnia.  I suggested we try Rozerem 8 mg nightly, given its efficacy for insomnia and reducing confusion in the elderly.  She does not present with any acute safety issues but has ongoing rumination and anxiety about relationship stressors.  She spends much of our time discussing the many damaged relationships in her life, but has difficulty thinking about how she has actually contributed to these damaged relationships.  She displays little to no insight, but was receptive to building insight into this with her therapist.  1. Major depressive disorder, recurrent episode, mild ([place])     Status of current problems: stable  Labs Ordered: No orders of the defined types were placed in this encounter.   Labs Reviewed: n/a  Collateral Obtained/Records Reviewed: n/a  Plan:  Continue clonazepam, Wellbutrin, Lyrica, Seroquel, doxepin as prescribed Follow-up  in 3 months Start Rozerem 8 mg nightly   Aundra Dubin, MD 07/17/2018,  1:35 PM

## 2018-07-21 ENCOUNTER — Ambulatory Visit (INDEPENDENT_AMBULATORY_CARE_PROVIDER_SITE_OTHER): Payer: Medicare Other | Admitting: Psychology

## 2018-07-21 DIAGNOSIS — F331 Major depressive disorder, recurrent, moderate: Secondary | ICD-10-CM | POA: Diagnosis not present

## 2018-08-12 ENCOUNTER — Ambulatory Visit (INDEPENDENT_AMBULATORY_CARE_PROVIDER_SITE_OTHER): Payer: Medicare Other

## 2018-08-12 ENCOUNTER — Ambulatory Visit (INDEPENDENT_AMBULATORY_CARE_PROVIDER_SITE_OTHER): Payer: Medicare Other | Admitting: Orthopaedic Surgery

## 2018-08-12 ENCOUNTER — Other Ambulatory Visit (INDEPENDENT_AMBULATORY_CARE_PROVIDER_SITE_OTHER): Payer: Self-pay

## 2018-08-12 ENCOUNTER — Encounter (INDEPENDENT_AMBULATORY_CARE_PROVIDER_SITE_OTHER): Payer: Self-pay | Admitting: Orthopaedic Surgery

## 2018-08-12 DIAGNOSIS — S8264XD Nondisplaced fracture of lateral malleolus of right fibula, subsequent encounter for closed fracture with routine healing: Secondary | ICD-10-CM | POA: Diagnosis not present

## 2018-08-12 DIAGNOSIS — S82899A Other fracture of unspecified lower leg, initial encounter for closed fracture: Secondary | ICD-10-CM

## 2018-08-12 MED ORDER — TRAMADOL HCL 50 MG PO TABS: 50.0000 mg | ORAL_TABLET | Freq: Four times a day (QID) | ORAL | 0 refills | Status: DC | PRN

## 2018-08-12 NOTE — Progress Notes (Signed)
Office Visit Note   Patient: Kendra Simmons           Date of Birth: Mar 07, 1951           MRN: [id number] Visit Date: 08/12/2018              Requested by: Donald Prose, MD [address] [place], [place] [address] PCP: Donald Prose, MD   Assessment & Plan: Visit Diagnoses:  1. Closed nondisplaced fracture of lateral malleolus of right fibula with routine healing     Plan: Due to the fact that she is now 3 months status post right lateral malleolus fracture that is failed to heal we will obtain a CT scan of the right ankle to evaluate the fracture also check for any fragments that may be impeding healing.  Follow-up after the CT scan to go over results and discuss further treatment.  She is weightbearing as tolerated.  Follow-Up Instructions: Return for Approximately 3 days after the CT scan.   Orders:  Orders Placed This Encounter  Procedures  . XR Ankle Complete Right   Meds ordered this encounter  Medications  . traMADol (ULTRAM) 50 MG tablet    Sig: Take 1 tablet (50 mg total) by mouth every 6 (six) hours as needed.    Dispense:  40 tablet    Refill:  0      Procedures: No procedures performed   Clinical Data: No additional findings.   Subjective: Chief Complaint  Patient presents with  . Right Ankle - Follow-up    HPI Mrs. Group returns today follow-up of her right ankle.  She sustained a right lateral malleolus fracture nondisplaced on 05/09/2018 patient has been treated conservatively.  She states she still has pain lateral aspect of the ankle.  She is wearing this today neoprene type brace during the day.  States swelling has gone down.  She did do some walking over the weekend and states she had severe pain over the next few days and had to really rest the ankle. Review of Systems   Objective: Vital Signs: There were no vitals taken for this visit.  Physical Exam  Constitutional: She is oriented to person, place, and time. She  appears well-developed and well-nourished. No distress.  Cardiovascular: Intact distal pulses.  Neurological: She is alert and oriented to person, place, and time.  Skin: She is not diaphoretic.    Ortho Exam Right ankle good dorsiflexion plantarflexion without pain.  She has 5 out of 5 strength with inversion eversion against resistance.  Tenderness over the lateral malleolus.  The remainder of the ankle is nontender.  Calf supple nontender. Specialty Comments:  No specialty comments available.  Imaging: Xr Ankle Complete Right  Result Date: 08/12/2018 Right ankle 3 views: Minimal interval healing since prior films.  The oblique nondisplaced fracture remains quite evident.  Talus well located within the ankle mortise.  No diastases.  No other acute fractures bony abnormalities.    PMFS History: Patient Active Problem List   Diagnosis Date Noted  . Closed nondisplaced fracture of lateral malleolus of right fibula 05/14/2018  . Chronic bilateral low back pain 07/01/2017  . Dehydration 07/03/2015  . UTI (lower urinary tract infection) 07/03/2015  . Hypokalemia 07/03/2015  . AKI (acute kidney injury) ([place]) 07/03/2015  . Tachycardia 05/27/2015  . MDD (major depressive disorder), recurrent, severe, with psychosis ([facility]) 05/25/2015  . 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 Insomnia disorder, with non-sleep disorder mental comorbidity, 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  .  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 Snoring 12/24/2014  . Unintended weight loss 12/24/2014  . Secondary parkinsonism ([facility]) 12/24/2014  . Panic attacks 07/26/2014  . Thrombocytopenia ([facility]) 12/17/2012  . DYSLIPIDEMIA 03/13/2010  . GERD 03/13/2010   Past Medical History:  Diagnosis Date  . Anxiety   . Arthritis   . Depression   . Emotional depression 02/04/2015  . GERD (gastroesophageal reflux disease)   . High cholesterol   . Insomnia   . Memory loss     Family History  Problem Relation Age of Onset  . Sleep apnea Father     . Alcohol abuse Father   . Diabetes Mother   . Diabetes Sister   . Diabetes Maternal Uncle   . Diabetes Cousin     Past Surgical History:  Procedure Laterality Date  . CESAREAN SECTION    . COSMETIC SURGERY    . ESOPHAGEAL MANOMETRY N/A 09/26/2016   Procedure: ESOPHAGEAL MANOMETRY (EM);  Surgeon: Clarene Essex, MD;  Location: [facility];  Service: Endoscopy;  Laterality: N/A;  . laproscopy     Social History   Occupational History  . Occupation: Retired  Tobacco Use  . Smoking status: Never Smoker  . Smokeless tobacco: Never Used  Substance and Sexual Activity  . Alcohol use: No    Alcohol/week: 0.0 standard drinks    Comment: zero  . Drug use: No    Types: Barbituates, Benzodiazepines    Comment: Denies any drug use other than benzos  . Sexual activity: Never    Birth control/protection: Abstinence

## 2018-08-14 ENCOUNTER — Ambulatory Visit (INDEPENDENT_AMBULATORY_CARE_PROVIDER_SITE_OTHER): Payer: Medicare Other | Admitting: Psychology

## 2018-08-14 ENCOUNTER — Ambulatory Visit
Admission: RE | Admit: 2018-08-14 | Discharge: 2018-08-14 | Disposition: A | Discharge status: Home / Self Care | Payer: Medicare Other | Source: Ambulatory Visit | Attending: Orthopaedic Surgery | Admitting: Orthopaedic Surgery

## 2018-08-14 DIAGNOSIS — F331 Major depressive disorder, recurrent, moderate: Secondary | ICD-10-CM | POA: Diagnosis not present

## 2018-08-14 DIAGNOSIS — S82899A Other fracture of unspecified lower leg, initial encounter for closed fracture: Secondary | ICD-10-CM

## 2018-08-15 ENCOUNTER — Telehealth (INDEPENDENT_AMBULATORY_CARE_PROVIDER_SITE_OTHER): Payer: Self-pay

## 2018-08-15 NOTE — Telephone Encounter (Signed)
Patient called asking for CT results over the phone, first available appt isn't until 9/9 or 9/10 and she states she isn't sure if she should go that long without results

## 2018-08-19 NOTE — Telephone Encounter (Signed)
Can you try to call her no answer and voicemail had not been set-up. She is healing the fracture but only about 40% healed. Continue WBAT. See if we can get a bone stimulator on her ankle.

## 2018-08-20 ENCOUNTER — Ambulatory Visit (INDEPENDENT_AMBULATORY_CARE_PROVIDER_SITE_OTHER): Payer: Medicare Other | Admitting: Physician Assistant

## 2018-08-20 ENCOUNTER — Encounter (INDEPENDENT_AMBULATORY_CARE_PROVIDER_SITE_OTHER): Payer: Self-pay | Admitting: Physician Assistant

## 2018-08-20 DIAGNOSIS — S8264XD Nondisplaced fracture of lateral malleolus of right fibula, subsequent encounter for closed fracture with routine healing: Secondary | ICD-10-CM | POA: Diagnosis not present

## 2018-08-20 DIAGNOSIS — S8264XK Nondisplaced fracture of lateral malleolus of right fibula, subsequent encounter for closed fracture with nonunion: Secondary | ICD-10-CM | POA: Diagnosis not present

## 2018-08-20 NOTE — Progress Notes (Signed)
Office Visit Note   Patient: Kendra Simmons           Date of Birth: Dec 07, 1951           MRN: [id number] Visit Date: 08/20/2018              Requested by: Donald Prose, MD [address] [place], [place] [address] PCP: Donald Prose, MD   Assessment & Plan: Visit Diagnoses:  1. Closed nondisplaced fracture of lateral malleolus of right fibula with routine healing   2. 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  She continues to have pain over the lateral aspect ankle with prolonged activity.  See her back in 1 month for 3 views of the right ankle.  She is weightbearing as tolerated  Follow-Up Instructions: Return in about 4 weeks (around 09/17/2018) for Radiographs.   Orders:  No orders of the defined types were placed in this encounter.  No orders of the defined types were placed in this encounter.     Procedures: No procedures performed   Clinical Data: No additional findings.   Subjective: Chief Complaint  Patient presents with  . Right Ankle - Follow-up    HPI Rhen returns today follow-up of her right ankle status post CT scan rule out nonunion.  CT scan scan showed the right lateral malleolus fracture to be approximately 40% bridged.  1 to 2 mm fracture gap still evident.  Fracture margins were corticated.    Review of Systems Please see HPI otherwise negative  Objective: Vital Signs: There were no vitals taken for this visit.  Physical Exam  Constitutional: She is oriented to person, place, and time. She appears well-developed and well-nourished. No distress.  Pulmonary/Chest: Effort normal.  Neurological: She is alert and oriented to person, place, and time.  Skin: She is not diaphoretic.    Ortho Exam Tenderness lateral malleolus right ankle.  He ambulates without  any assistive device. Specialty Comments:  No specialty comments available.  Imaging: No results found.   PMFS History: Patient Active Problem List   Diagnosis Date Noted  . Chronic bilateral low back pain 07/01/2017  . Dehydration 07/03/2015  . UTI (lower urinary tract infection) 07/03/2015  . Hypokalemia 07/03/2015  . AKI (acute kidney injury) ([place]) 07/03/2015  . Tachycardia 05/27/2015  . MDD (major depressive disorder), recurrent, severe, with psychosis ([facility]) 05/25/2015  . 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 Insomnia disorder, with non-sleep disorder mental comorbidity, 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  . 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 Snoring 12/24/2014  . Unintended weight loss 12/24/2014  . Secondary parkinsonism ([place]) 12/24/2014  . Panic attacks 07/26/2014  . Thrombocytopenia ([facility]) 12/17/2012  . DYSLIPIDEMIA 03/13/2010  . GERD 03/13/2010   Past Medical History:  Diagnosis Date  . Anxiety   . Arthritis   . Depression   . Emotional depression 02/04/2015  . GERD (gastroesophageal reflux disease)   . High cholesterol   . Insomnia   . Memory loss     Family History  Problem Relation Age of Onset  . Sleep apnea Father   . Alcohol abuse Father   . Diabetes Mother   . Diabetes Sister   . Diabetes Maternal Uncle   . Diabetes Cousin     Past Surgical History:  Procedure Laterality Date  . CESAREAN SECTION    .  COSMETIC SURGERY    . ESOPHAGEAL MANOMETRY N/A 09/26/2016   Procedure: ESOPHAGEAL MANOMETRY (EM);  Surgeon: Clarene Essex, MD;  Location: [facility];  Service: Endoscopy;  Laterality: N/A;  . laproscopy     Social History   Occupational History  . Occupation: Retired  Tobacco Use  . Smoking status: Never Smoker  . Smokeless tobacco: Never Used  Substance and Sexual Activity  . Alcohol use: No    Alcohol/week: 0.0 standard drinks    Comment: zero  . Drug use: No    Types: Barbituates, Benzodiazepines    Comment: Denies any drug use other than benzos  .  Sexual activity: Never    Birth control/protection: Abstinence

## 2018-08-22 ENCOUNTER — Other Ambulatory Visit (INDEPENDENT_AMBULATORY_CARE_PROVIDER_SITE_OTHER): Payer: Self-pay | Admitting: Physician Assistant

## 2018-08-22 NOTE — Telephone Encounter (Signed)
Please advise 

## 2018-09-09 ENCOUNTER — Ambulatory Visit (INDEPENDENT_AMBULATORY_CARE_PROVIDER_SITE_OTHER): Payer: Medicare Other | Admitting: Psychology

## 2018-09-09 DIAGNOSIS — F331 Major depressive disorder, recurrent, moderate: Secondary | ICD-10-CM | POA: Diagnosis not present

## 2018-09-10 ENCOUNTER — Telehealth (INDEPENDENT_AMBULATORY_CARE_PROVIDER_SITE_OTHER): Payer: Self-pay | Admitting: Orthopaedic Surgery

## 2018-09-10 MED ORDER — TRAMADOL HCL 50 MG PO TABS: 50.0000 mg | ORAL_TABLET | Freq: Four times a day (QID) | ORAL | 0 refills | Status: DC | PRN

## 2018-09-10 NOTE — Telephone Encounter (Signed)
Please advise 

## 2018-09-10 NOTE — Telephone Encounter (Signed)
Patient left a vm requesting rx [facility] [address]. Patients # [phone]

## 2018-09-10 NOTE — Telephone Encounter (Signed)
I sent some in 

## 2018-09-15 ENCOUNTER — Other Ambulatory Visit (INDEPENDENT_AMBULATORY_CARE_PROVIDER_SITE_OTHER): Payer: Self-pay | Admitting: Orthopaedic Surgery

## 2018-09-15 NOTE — Telephone Encounter (Signed)
Ok to rf? 

## 2018-09-16 ENCOUNTER — Other Ambulatory Visit (HOSPITAL_COMMUNITY): Payer: Self-pay | Admitting: Psychiatry

## 2018-09-16 DIAGNOSIS — F330 Major depressive disorder, recurrent, mild: Secondary | ICD-10-CM

## 2018-09-18 ENCOUNTER — Encounter (INDEPENDENT_AMBULATORY_CARE_PROVIDER_SITE_OTHER): Payer: Self-pay | Admitting: Orthopaedic Surgery

## 2018-09-18 ENCOUNTER — Ambulatory Visit (INDEPENDENT_AMBULATORY_CARE_PROVIDER_SITE_OTHER): Payer: Medicare Other | Admitting: Orthopaedic Surgery

## 2018-09-18 ENCOUNTER — Ambulatory Visit (INDEPENDENT_AMBULATORY_CARE_PROVIDER_SITE_OTHER): Payer: Self-pay

## 2018-09-18 ENCOUNTER — Other Ambulatory Visit (HOSPITAL_COMMUNITY): Payer: Self-pay

## 2018-09-18 DIAGNOSIS — M25571 Pain in right ankle and joints of right foot: Secondary | ICD-10-CM | POA: Diagnosis not present

## 2018-09-18 DIAGNOSIS — S8264XD Nondisplaced fracture of lateral malleolus of right fibula, subsequent encounter for closed fracture with routine healing: Secondary | ICD-10-CM | POA: Diagnosis not present

## 2018-09-18 DIAGNOSIS — F330 Major depressive disorder, recurrent, mild: Secondary | ICD-10-CM

## 2018-09-18 MED ORDER — CLONAZEPAM 0.5 MG PO TABS: 0.5000 mg | ORAL_TABLET | Freq: Every day | ORAL | 0 refills | Status: DC

## 2018-09-18 NOTE — Progress Notes (Signed)
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  She still reports swelling but she does understand or at least have explained to her is swelling and ankle can still take 6 months to a year to resolve even ankles that have healed completely.  On exam, she does have some slight lateral ankle swelling on the right side.  Her ankle range of motion is full and her ankle feels ligamentously stable.  3 views of the right ankle show significant healing to where the fracture is almost healed at this point.  It looks significantly different from comparative x-rays from late in August.  She will continue increase her activities as comfort allows.  We will see her back for final visit in 4 weeks with just an AP and mortise view of the right ankle.

## 2018-09-19 ENCOUNTER — Other Ambulatory Visit (HOSPITAL_COMMUNITY): Payer: Self-pay

## 2018-09-19 DIAGNOSIS — F330 Major depressive disorder, recurrent, mild: Secondary | ICD-10-CM

## 2018-09-19 MED ORDER — QUETIAPINE FUMARATE 400 MG PO TABS: 400.0000 mg | ORAL_TABLET | Freq: Every day | ORAL | 0 refills | Status: DC

## 2018-09-19 MED ORDER — DOXEPIN HCL 75 MG PO CAPS: 75.0000 mg | ORAL_CAPSULE | Freq: Every day | ORAL | 0 refills | Status: DC

## 2018-09-23 ENCOUNTER — Ambulatory Visit (INDEPENDENT_AMBULATORY_CARE_PROVIDER_SITE_OTHER): Payer: Medicare Other | Admitting: Psychology

## 2018-09-23 DIAGNOSIS — F331 Major depressive disorder, recurrent, moderate: Secondary | ICD-10-CM | POA: Diagnosis not present

## 2018-09-28 ENCOUNTER — Other Ambulatory Visit (HOSPITAL_COMMUNITY): Payer: Self-pay | Admitting: Psychiatry

## 2018-09-28 DIAGNOSIS — F330 Major depressive disorder, recurrent, mild: Secondary | ICD-10-CM

## 2018-10-07 ENCOUNTER — Other Ambulatory Visit (HOSPITAL_COMMUNITY): Payer: Self-pay

## 2018-10-07 DIAGNOSIS — F330 Major depressive disorder, recurrent, mild: Secondary | ICD-10-CM

## 2018-10-07 MED ORDER — RAMELTEON 8 MG PO TABS: 8.0000 mg | ORAL_TABLET | Freq: Every day | ORAL | 0 refills | Status: DC

## 2018-10-14 ENCOUNTER — Ambulatory Visit (INDEPENDENT_AMBULATORY_CARE_PROVIDER_SITE_OTHER): Payer: Medicare Other | Admitting: Psychology

## 2018-10-14 DIAGNOSIS — F331 Major depressive disorder, recurrent, moderate: Secondary | ICD-10-CM

## 2018-10-16 ENCOUNTER — Ambulatory Visit (INDEPENDENT_AMBULATORY_CARE_PROVIDER_SITE_OTHER): Payer: Medicare Other | Admitting: Orthopaedic Surgery

## 2018-10-17 ENCOUNTER — Ambulatory Visit (INDEPENDENT_AMBULATORY_CARE_PROVIDER_SITE_OTHER): Payer: Medicare Other | Admitting: Psychiatry

## 2018-10-17 ENCOUNTER — Encounter (HOSPITAL_COMMUNITY): Payer: Self-pay | Admitting: Psychiatry

## 2018-10-17 VITALS — BP 128/74 | Ht 63.0 in | Wt 200.0 lb

## 2018-10-17 DIAGNOSIS — F411 Generalized anxiety disorder: Secondary | ICD-10-CM | POA: Diagnosis not present

## 2018-10-17 DIAGNOSIS — F5101 Primary insomnia: Secondary | ICD-10-CM

## 2018-10-17 DIAGNOSIS — F330 Major depressive disorder, recurrent, mild: Secondary | ICD-10-CM | POA: Diagnosis not present

## 2018-10-17 MED ORDER — BUPROPION HCL ER (XL) 150 MG PO TB24: ORAL_TABLET | ORAL | 0 refills | Status: DC

## 2018-10-17 MED ORDER — DOXEPIN HCL 75 MG PO CAPS: 75.0000 mg | ORAL_CAPSULE | Freq: Every day | ORAL | 0 refills | Status: DC

## 2018-10-17 MED ORDER — PREGABALIN 75 MG PO CAPS: 75.0000 mg | ORAL_CAPSULE | Freq: Every day | ORAL | 0 refills | Status: DC

## 2018-10-17 MED ORDER — QUETIAPINE FUMARATE 400 MG PO TABS: 400.0000 mg | ORAL_TABLET | Freq: Every day | ORAL | 0 refills | Status: DC

## 2018-10-17 MED ORDER — CLONAZEPAM 0.5 MG PO TABS: 0.5000 mg | ORAL_TABLET | Freq: Every day | ORAL | 0 refills | Status: DC | PRN

## 2018-10-17 MED ORDER — RAMELTEON 8 MG PO TABS: 8.0000 mg | ORAL_TABLET | Freq: Every day | ORAL | 2 refills | Status: DC

## 2018-10-17 NOTE — Progress Notes (Signed)
[facility] MD/PA/NP OP Progress Note  10/17/2018 11:07 AM Kendra Simmons  MRN:  [id number]  Chief Complaint: I am doing fine.  Rozerem helps most of the time for my sleep.  HPI: Patient came for her follow-up appointment.  She is taking her medication as prescribed.  Now she had a mediation with her sister about the property issue and she is happy that she is getting more than a acre.  However she still have chronic issues with her family member time to time.  She is happy that her son is helping her a lot.  She is now upset on her daughter who disappear 2 months ago and lived up [place] and then moved to [place] with her boyfriend's family and now decided to come back and wanted to live with her.  She is not happy with the daughter because daughter usually calls when she is in crisis.  However being a mother she is trying to help her as much.  She feels that most of her family member had abandoned her.  She is seeing Dr. Marlowe Sax for therapy.  She feels Rozerem helping her sleep.  She is not willing to cut down her psychiatric medication.  She takes 6 medication and she feels that she needed.  She has no side effects including tremors shakes or any EPS.  She has some financial issues because she is spending too much money for mediation with the sister.  She like to work part-time to help her financial and to keep herself busy.  Patient denies any suicidal thoughts or homicidal thought.  She denies any paranoia, hallucination or any psychosis.  Her energy level is fair.  Her appetite is okay.  Visit Diagnosis:    ICD-10-CM   1. GAD (generalized anxiety disorder) F41.1   2. Major depressive disorder, recurrent episode, mild (HCC) F33.0 ramelteon (ROZEREM) 8 MG tablet    QUEtiapine (SEROQUEL) 400 MG tablet    clonazePAM (KLONOPIN) 0.5 MG tablet    buPROPion (WELLBUTRIN XL) 150 MG 24 hr tablet    doxepin (SINEQUAN) 75 MG capsule    pregabalin (LYRICA) 75 MG capsule  3. Primary insomnia F51.01     Past  Psychiatric History: Reviewed. Patient has multiple hospitalization. Her last hospitalization was in June 2016. She has done multiple intensive outpatient program. She has history of hallucination, paranoia, severe depression. In the past she had seen [name], [name] and Dr. Thurmond Butts. She has taken Xanax, amitriptyline, imipramine, temazepam, trazodone, Zyprexa, Luvox, Wellbutrin, Prozac, Vistaril, Remeronand Bellsomra.  Past Medical History:  Past Medical History:  Diagnosis Date  . Anxiety   . Arthritis   . Depression   . Emotional depression 02/04/2015  . GERD (gastroesophageal reflux disease)   . High cholesterol   . Insomnia   . Memory loss     Past Surgical History:  Procedure Laterality Date  . CESAREAN SECTION    . COSMETIC SURGERY    . ESOPHAGEAL MANOMETRY N/A 09/26/2016   Procedure: ESOPHAGEAL MANOMETRY (EM);  Surgeon: Clarene Essex, MD;  Location: [facility];  Service: Endoscopy;  Laterality: N/A;  . laproscopy      Family Psychiatric History: Viewed.  Family History:  Family History  Problem Relation Age of Onset  . Sleep apnea Father   . Alcohol abuse Father   . Diabetes Mother   . Diabetes Sister   . Diabetes Maternal Uncle   . Diabetes Cousin     Social History:  Social History   Socioeconomic History  .  Marital status: Divorced    Spouse name: Not on file  . Number of children: 3  . Years of education: 26  . Highest education level: Not on file  Occupational History  . Occupation: Retired  Scientific laboratory technician  . Financial resource strain: Not on file  . Food insecurity:    Worry: Not on file    Inability: Not on file  . Transportation needs:    Medical: Not on file    Non-medical: Not on file  Tobacco Use  . Smoking status: Never Smoker  . Smokeless tobacco: Never Used  Substance and Sexual Activity  . Alcohol use: No    Alcohol/week: 0.0 standard drinks    Comment: zero  . Drug use: No    Types: Barbituates, Benzodiazepines     Comment: Denies any drug use other than benzos  . Sexual activity: Never    Birth control/protection: Abstinence  Lifestyle  . Physical activity:    Days per week: Not on file    Minutes per session: Not on file  . Stress: Not on file  Relationships  . Social connections:    Talks on phone: Not on file    Gets together: Not on file    Attends religious service: Not on file    Active member of club or organization: Not on file    Attends meetings of clubs or organizations: Not on file    Relationship status: Not on file  Other Topics Concern  . Not on file  Social History Narrative   Patient lives at home alone.   Caffeine Use:  2 cokes daily   Sister lives next door.    Allergies:  Allergies  Allergen Reactions  . Phentermine Anxiety    Metabolic Disorder Labs: Lab Results  Component Value Date   HGBA1C 5.8 (H) 11/04/2014   MPG 120 (H) 11/04/2014   MPG 134 (H) 07/13/2014   Lab Results  Component Value Date   PROLACTIN 50.3 (H) 05/25/2015   Lab Results  Component Value Date   CHOL 177 11/04/2014   TRIG 79 11/04/2014   HDL 45 11/04/2014   CHOLHDL 3.9 11/04/2014   VLDL 16 11/04/2014   LDLCALC 116 (H) 11/04/2014   LDLCALC 71 07/20/2014   Lab Results  Component Value Date   TSH 1.523 07/04/2015   TSH 2.704 05/29/2015    Therapeutic Level Labs: No results found for: LITHIUM No results found for: VALPROATE No components found for:  CBMZ  Current Medications: Current Outpatient Medications  Medication Sig Dispense Refill  . buPROPion (WELLBUTRIN XL) 150 MG 24 hr tablet TAKE 1 TABLET BY MOUTH EVERY DAY IN THE MORNING 90 tablet 0  . clonazePAM (KLONOPIN) 0.5 MG tablet Take 1 tablet (0.5 mg total) by mouth at bedtime. 30 tablet 0  . [facility] VITAMIN B-6 100 MG tablet 1 TABLET BY MOUTH TWICE DAILY  2  . diclofenac sodium (VOLTAREN) 1 % GEL Apply 2 g topically 4 (four) times daily. 100 g 3  . doxepin (SINEQUAN) 75 MG capsule Take 1 capsule (75 mg total) by mouth at  bedtime. 90 capsule 0  . HYDROcodone-acetaminophen (NORCO/VICODIN) 5-325 MG tablet Take 1 tablet by mouth every 6 (six) hours as needed for moderate pain. 30 tablet 0  . ibuprofen (ADVIL,MOTRIN) 200 MG tablet Take 400 mg by mouth 2 (two) times daily.    [name] linaclotide (LINZESS) 145 MCG CAPS capsule Take 145 mcg by mouth every other day.    . meloxicam (MOBIC) 7.5  MG tablet TAKE 1 TABLET BY MOUTH TWICE A DAY 60 tablet 3  . memantine (NAMENDA) 10 MG tablet Take 1 tablet (10 mg total) by mouth 2 (two) times daily. 60 tablet 12  . Misc. Devices ([place]) MISC Use walker daily to remain non-weight-bearing 1 each 0  . omeprazole (PRILOSEC) 40 MG capsule 40 mg.  3  . pravastatin (PRAVACHOL) 20 MG tablet Take 1 tablet (20 mg total) by mouth at bedtime. For high cholesterol    . pregabalin (LYRICA) 75 MG capsule Take 1 capsule (75 mg total) by mouth at bedtime. 90 capsule 0  . QUEtiapine (SEROQUEL) 400 MG tablet Take 1 tablet (400 mg total) by mouth at bedtime. 90 tablet 0  . ramelteon (ROZEREM) 8 MG tablet Take 1 tablet (8 mg total) by mouth at bedtime. 30 tablet 0  . tamsulosin (FLOMAX) 0.4 MG CAPS capsule Take 1 capsule (0.4 mg total) by mouth daily after supper. For frequent urination/urgency 15 capsule 0  . tiZANidine (ZANAFLEX) 4 MG capsule Take 1 capsule (4 mg total) by mouth 2 (two) times daily as needed for muscle spasms. 40 capsule 0  . tiZANidine (ZANAFLEX) 4 MG tablet TAKE 1 TABLET BY MOUTH AT BEDTIME 30 tablet 0  . traMADol (ULTRAM) 50 MG tablet Take 1 tablet (50 mg total) by mouth every 6 (six) hours as needed. 40 tablet 0   No current facility-administered medications for this visit.      Musculoskeletal: Strength & Muscle Tone: within normal limits Gait & Station: normal Patient leans: N/A  Psychiatric Specialty Exam: ROS  Blood pressure 128/74, height 5\' 3"  (1.6 m), weight 200 lb (90.7 kg).Body mass index is 35.43 kg/m.  General Appearance: Casual  Eye Contact:  Good   Speech:  Clear and Coherent  Volume:  Normal  Mood:  Anxious  Affect:  Appropriate  Thought Process:  Goal Directed  Orientation:  Full (Time, Place, and Person)  Thought Content: Rumination   Suicidal Thoughts:  No  Homicidal Thoughts:  No  Memory:  Immediate;   Good  Judgement:  Good  Insight:  Fair  Psychomotor Activity:  Normal  Concentration:  Concentration: Fair and Attention Span: Fair  Recall:  Good  Fund of Knowledge: Good  Language: Good  Akathisia:  No  Handed:  Right  AIMS (if indicated): not done  Assets:  Communication Skills Desire for Improvement Housing  ADL's:  Intact  Cognition: WNL  Sleep:  Fair   Screenings: AIMS     ED to Hosp-Admission (Discharged) from 05/24/2015 in [facility]  AIMS Total Score  0    AUDIT     ED to Hosp-Admission (Discharged) from 05/24/2015 in [facility] Admission (Discharged) from 12/13/2014 in [facility] Admission (Discharged) from 11/02/2014 in [facility] Admission (Discharged) from 07/11/2014 in [facility]  Alcohol Use Disorder Identification Test Final Score (AUDIT)  0  0  0  0    Mini-Mental     Office Visit from 11/16/2015 in [facility] Office Visit from 03/10/2015 in [facility]  Total Score (max 30 points )  24  21    PHQ2-9     Counselor from 09/12/2015 in [facility]  PHQ-2 Total Score  2  PHQ-9 Total Score  4       Assessment and Plan: Major depressive disorder, recurrent.  Generalized  anxiety disorder.  Chronic insomnia.  Discuss her psychosocial stressors and encourage to continue therapy with Dr. Marlowe Sax.  Patient is a stable on her current medication.  We discussed polypharmacy but at this time she does not want to cut down or reduce medication.  I will continue doxepin  75 mg daily, Lyrica 75 mg at bedtime, Wellbutrin XL 150 mg daily, Rozerem 8 mg at bedtime, Klonopin 0.5 mg at bedtime for anxiety and Seroquel 400 mg at bedtime.  Recommended to call us back if she has any question or any concern.  We will continue to try cutting down her medication and reinforcing issues with polypharmacy.  Often 3 months.   [name], MD 10/17/2018, 11:07 AM

## 2018-10-28 ENCOUNTER — Ambulatory Visit: Payer: Medicare Other | Admitting: Psychology

## 2018-10-28 ENCOUNTER — Ambulatory Visit (INDEPENDENT_AMBULATORY_CARE_PROVIDER_SITE_OTHER): Payer: Medicare Other | Admitting: Orthopaedic Surgery

## 2018-11-08 ENCOUNTER — Other Ambulatory Visit (INDEPENDENT_AMBULATORY_CARE_PROVIDER_SITE_OTHER): Payer: Self-pay | Admitting: Orthopaedic Surgery

## 2018-11-10 ENCOUNTER — Ambulatory Visit (INDEPENDENT_AMBULATORY_CARE_PROVIDER_SITE_OTHER): Payer: Medicare Other | Admitting: Orthopaedic Surgery

## 2018-11-10 ENCOUNTER — Encounter (INDEPENDENT_AMBULATORY_CARE_PROVIDER_SITE_OTHER): Payer: Self-pay | Admitting: Orthopaedic Surgery

## 2018-11-10 ENCOUNTER — Ambulatory Visit (INDEPENDENT_AMBULATORY_CARE_PROVIDER_SITE_OTHER): Payer: Self-pay

## 2018-11-10 DIAGNOSIS — S8264XD Nondisplaced fracture of lateral malleolus of right fibula, subsequent encounter for closed fracture with routine healing: Secondary | ICD-10-CM

## 2018-11-10 NOTE — Telephone Encounter (Signed)
Please advise 

## 2018-11-10 NOTE — Progress Notes (Signed)
The patient is well-known to [place].  Almost 6 months ago she sustained an injury to her right ankle with a fracture of the lateral malleolus that was minimally displaced.  She eventually had difficulty completely healing the fracture.  It was more of a nonunion versus delayed union.  We did get her for placement of her bone stimulator.  She denies any ankle issues at this point.  On examination of her right ankle the swelling is gone.  Her range of motion is full.  She has no pain over the fracture site of the lateral malleolus.  Her ankle is ligamentously stable.  2 views of the right ankle show a healed lateral malleolus fracture and an intact ankle mortise.  At this point she can be released to full activities as comfort allows.  All question concerns were answered and addressed.  Follow-up will be as needed.

## 2018-11-11 ENCOUNTER — Ambulatory Visit (INDEPENDENT_AMBULATORY_CARE_PROVIDER_SITE_OTHER): Payer: Medicare Other | Admitting: Psychology

## 2018-11-11 DIAGNOSIS — F331 Major depressive disorder, recurrent, moderate: Secondary | ICD-10-CM | POA: Diagnosis not present

## 2018-11-17 ENCOUNTER — Other Ambulatory Visit (HOSPITAL_COMMUNITY): Payer: Self-pay

## 2018-11-17 ENCOUNTER — Other Ambulatory Visit (HOSPITAL_COMMUNITY): Payer: Self-pay | Admitting: Psychiatry

## 2018-11-17 DIAGNOSIS — F330 Major depressive disorder, recurrent, mild: Secondary | ICD-10-CM

## 2018-11-25 ENCOUNTER — Ambulatory Visit: Payer: Self-pay | Admitting: Psychology

## 2018-12-01 ENCOUNTER — Other Ambulatory Visit: Payer: Self-pay | Admitting: Family Medicine

## 2018-12-01 DIAGNOSIS — Z1231 Encounter for screening mammogram for malignant neoplasm of breast: Secondary | ICD-10-CM

## 2018-12-04 ENCOUNTER — Telehealth (INDEPENDENT_AMBULATORY_CARE_PROVIDER_SITE_OTHER): Payer: Self-pay | Admitting: Orthopaedic Surgery

## 2018-12-04 ENCOUNTER — Ambulatory Visit
Admission: RE | Admit: 2018-12-04 | Discharge: 2018-12-04 | Disposition: A | Discharge status: Home / Self Care | Payer: Medicare Other | Source: Ambulatory Visit | Attending: Family Medicine | Admitting: Family Medicine

## 2018-12-04 DIAGNOSIS — Z1231 Encounter for screening mammogram for malignant neoplasm of breast: Secondary | ICD-10-CM

## 2018-12-04 MED ORDER — TIZANIDINE HCL 4 MG PO CAPS: 4.0000 mg | ORAL_CAPSULE | Freq: Two times a day (BID) | ORAL | 0 refills | Status: DC | PRN

## 2018-12-04 NOTE — Telephone Encounter (Signed)
Patient called needing Rx refilled (Tizanidine) tabs  [facility] [address] The number to contact patient is [phone]

## 2018-12-04 NOTE — Telephone Encounter (Signed)
Please advise 

## 2018-12-08 ENCOUNTER — Observation Stay (HOSPITAL_COMMUNITY)
Admission: EM | Admit: 2018-12-08 | Discharge: 2018-12-11 | Disposition: A | Discharge status: Home / Self Care | Payer: Medicare Other | Attending: Nephrology | Admitting: Nephrology

## 2018-12-08 ENCOUNTER — Emergency Department (HOSPITAL_COMMUNITY): Payer: Medicare Other

## 2018-12-08 ENCOUNTER — Other Ambulatory Visit: Payer: Self-pay

## 2018-12-08 DIAGNOSIS — F419 Anxiety disorder, unspecified: Secondary | ICD-10-CM | POA: Diagnosis not present

## 2018-12-08 DIAGNOSIS — K1120 Sialoadenitis, unspecified: Secondary | ICD-10-CM | POA: Insufficient documentation

## 2018-12-08 DIAGNOSIS — J181 Lobar pneumonia, unspecified organism: Secondary | ICD-10-CM

## 2018-12-08 DIAGNOSIS — Z791 Long term (current) use of non-steroidal anti-inflammatories (NSAID): Secondary | ICD-10-CM | POA: Insufficient documentation

## 2018-12-08 DIAGNOSIS — Z789 Other specified health status: Secondary | ICD-10-CM | POA: Diagnosis not present

## 2018-12-08 DIAGNOSIS — A419 Sepsis, unspecified organism: Secondary | ICD-10-CM | POA: Diagnosis present

## 2018-12-08 DIAGNOSIS — Z79899 Other long term (current) drug therapy: Secondary | ICD-10-CM | POA: Insufficient documentation

## 2018-12-08 DIAGNOSIS — R1310 Dysphagia, unspecified: Secondary | ICD-10-CM

## 2018-12-08 DIAGNOSIS — F329 Major depressive disorder, single episode, unspecified: Secondary | ICD-10-CM

## 2018-12-08 DIAGNOSIS — J690 Pneumonitis due to inhalation of food and vomit: Principal | ICD-10-CM | POA: Insufficient documentation

## 2018-12-08 DIAGNOSIS — D649 Anemia, unspecified: Secondary | ICD-10-CM | POA: Diagnosis not present

## 2018-12-08 DIAGNOSIS — K219 Gastro-esophageal reflux disease without esophagitis: Secondary | ICD-10-CM | POA: Diagnosis not present

## 2018-12-08 DIAGNOSIS — R1319 Other dysphagia: Secondary | ICD-10-CM

## 2018-12-08 DIAGNOSIS — R1110 Vomiting, unspecified: Secondary | ICD-10-CM

## 2018-12-08 DIAGNOSIS — Z881 Allergy status to other antibiotic agents status: Secondary | ICD-10-CM | POA: Insufficient documentation

## 2018-12-08 DIAGNOSIS — R300 Dysuria: Secondary | ICD-10-CM | POA: Diagnosis not present

## 2018-12-08 DIAGNOSIS — Z880 Allergy status to penicillin: Secondary | ICD-10-CM | POA: Insufficient documentation

## 2018-12-08 DIAGNOSIS — F333 Major depressive disorder, recurrent, severe with psychotic symptoms: Secondary | ICD-10-CM | POA: Insufficient documentation

## 2018-12-08 DIAGNOSIS — J189 Pneumonia, unspecified organism: Secondary | ICD-10-CM

## 2018-12-08 LAB — COMPREHENSIVE METABOLIC PANEL
ALT: 23 U/L (ref 0–44)
AST: 34 U/L (ref 15–41)
Albumin: 4.4 g/dL (ref 3.5–5.0)
Alkaline Phosphatase: 56 U/L (ref 38–126)
Anion gap: 11 (ref 5–15)
BILIRUBIN TOTAL: 0.7 mg/dL (ref 0.3–1.2)
BUN: 15 mg/dL (ref 8–23)
CO2: 23 mmol/L (ref 22–32)
Calcium: 9.4 mg/dL (ref 8.9–10.3)
Chloride: 108 mmol/L (ref 98–111)
Creatinine, Ser: 1.01 mg/dL — ABNORMAL HIGH (ref 0.44–1.00)
GFR calc Af Amer: >60 mL/min (ref >60)
GFR calc non Af Amer: 58 mL/min — ABNORMAL LOW (ref >60)
Glucose, Bld: 160 mg/dL — ABNORMAL HIGH (ref 70–99)
Potassium: 4.3 mmol/L (ref 3.5–5.1)
Sodium: 142 mmol/L (ref 135–145)
TOTAL PROTEIN: 7.2 g/dL (ref 6.5–8.1)

## 2018-12-08 LAB — CBC WITH DIFFERENTIAL/PLATELET
Abs Immature Granulocytes: 0.02 K/uL (ref 0.00–0.07)
Basophils Absolute: 0 K/uL (ref 0.0–0.1)
Basophils Relative: 0 %
Eosinophils Absolute: 0 K/uL (ref 0.0–0.5)
Eosinophils Relative: 0 %
HCT: 42.5 % (ref 36.0–46.0)
Hemoglobin: 13.8 g/dL (ref 12.0–15.0)
Immature Granulocytes: 0 %
Lymphocytes Relative: 5 %
Lymphs Abs: 0.4 K/uL — ABNORMAL LOW (ref 0.7–4.0)
MCH: 31.4 pg (ref 26.0–34.0)
MCHC: 32.5 g/dL (ref 30.0–36.0)
MCV: 96.6 fL (ref 80.0–100.0)
Monocytes Absolute: 0.5 K/uL (ref 0.1–1.0)
Monocytes Relative: 7 %
Neutro Abs: 6.2 K/uL (ref 1.7–7.7)
Neutrophils Relative %: 88 %
Platelets: 122 K/uL — ABNORMAL LOW (ref 150–400)
RBC: 4.4 MIL/uL (ref 3.87–5.11)
RDW: 13 % (ref 11.5–15.5)
WBC: 7.1 K/uL (ref 4.0–10.5)
nRBC: 0 % (ref 0.0–0.2)

## 2018-12-08 LAB — URINALYSIS, ROUTINE W REFLEX MICROSCOPIC
Bilirubin Urine: NEGATIVE
Glucose, UA: NEGATIVE mg/dL
Hgb urine dipstick: NEGATIVE
Ketones, ur: NEGATIVE mg/dL
Leukocytes, UA: NEGATIVE
Nitrite: NEGATIVE
Protein, ur: NEGATIVE mg/dL
Specific Gravity, Urine: 1.015 (ref 1.005–1.030)
pH: 6 (ref 5.0–8.0)

## 2018-12-08 LAB — I-STAT CG4 LACTIC ACID, ED
Lactic Acid, Venous: 2.06 mmol/L (ref 0.5–1.9)
Lactic Acid, Venous: 2.28 mmol/L (ref 0.5–1.9)

## 2018-12-08 LAB — INFLUENZA PANEL BY PCR (TYPE A & B)
Influenza A By PCR: NEGATIVE
Influenza B By PCR: NEGATIVE

## 2018-12-08 MED ORDER — SODIUM CHLORIDE 0.9 % IV BOLUS (SEPSIS)
1000.0000 mL | Freq: Once | INTRAVENOUS | Status: AC
  Administered 2018-12-08: 1000 mL via INTRAVENOUS

## 2018-12-08 MED ORDER — RAMELTEON 8 MG PO TABS
8.0000 mg | ORAL_TABLET | Freq: Every day | ORAL | Status: DC
  Administered 2018-12-09 – 2018-12-10 (×3): 8 mg via ORAL
  Filled 2018-12-08 (×4): qty 1

## 2018-12-08 MED ORDER — CALCIUM CARBONATE 1250 (500 CA) MG PO TABS
1250.0000 mg | ORAL_TABLET | Freq: Every day | ORAL | Status: DC
  Administered 2018-12-09 – 2018-12-11 (×3): 1250 mg via ORAL
  Filled 2018-12-08 (×3): qty 1

## 2018-12-08 MED ORDER — DICYCLOMINE HCL 10 MG PO CAPS
10.0000 mg | ORAL_CAPSULE | Freq: Once | ORAL | Status: AC
  Administered 2018-12-08: 10 mg via ORAL
  Filled 2018-12-08: qty 1

## 2018-12-08 MED ORDER — SODIUM CHLORIDE 0.9 % IV BOLUS: 1000.0000 mL | Freq: Once | INTRAVENOUS | Status: DC

## 2018-12-08 MED ORDER — DOXEPIN HCL 25 MG PO CAPS
75.0000 mg | ORAL_CAPSULE | Freq: Every day | ORAL | Status: DC
  Administered 2018-12-09 – 2018-12-10 (×3): 75 mg via ORAL
  Filled 2018-12-08: qty 1
  Filled 2018-12-08 (×3): qty 3

## 2018-12-08 MED ORDER — BUPROPION HCL ER (XL) 150 MG PO TB24
150.0000 mg | ORAL_TABLET | Freq: Every day | ORAL | Status: DC
  Administered 2018-12-09 – 2018-12-11 (×3): 150 mg via ORAL
  Filled 2018-12-08 (×3): qty 1

## 2018-12-08 MED ORDER — SODIUM CHLORIDE 0.9 % IV SOLN
INTRAVENOUS | Status: DC
  Administered 2018-12-08 – 2018-12-10 (×2): via INTRAVENOUS

## 2018-12-08 MED ORDER — PANTOPRAZOLE SODIUM 40 MG PO TBEC
40.0000 mg | DELAYED_RELEASE_TABLET | Freq: Every day | ORAL | Status: DC
  Administered 2018-12-09 – 2018-12-11 (×3): 40 mg via ORAL
  Filled 2018-12-08 (×3): qty 1

## 2018-12-08 MED ORDER — PREGABALIN 75 MG PO CAPS
75.0000 mg | ORAL_CAPSULE | Freq: Every day | ORAL | Status: DC
  Administered 2018-12-08 – 2018-12-10 (×3): 75 mg via ORAL
  Filled 2018-12-08 (×3): qty 1

## 2018-12-08 MED ORDER — PROCHLORPERAZINE EDISYLATE 10 MG/2ML IJ SOLN
10.0000 mg | Freq: Once | INTRAMUSCULAR | Status: AC
  Administered 2018-12-08: 10 mg via INTRAVENOUS
  Filled 2018-12-08: qty 2

## 2018-12-08 MED ORDER — SODIUM CHLORIDE 0.9 % IV SOLN
2.0000 g | INTRAVENOUS | Status: DC
  Administered 2018-12-08: 2 g via INTRAVENOUS
  Filled 2018-12-08: qty 20

## 2018-12-08 MED ORDER — CLINDAMYCIN PHOSPHATE 300 MG/50ML IV SOLN
300.0000 mg | Freq: Four times a day (QID) | INTRAVENOUS | Status: DC
  Administered 2018-12-08 – 2018-12-09 (×3): 300 mg via INTRAVENOUS
  Filled 2018-12-08 (×4): qty 50

## 2018-12-08 MED ORDER — CLONAZEPAM 0.5 MG PO TABS
0.5000 mg | ORAL_TABLET | Freq: Every day | ORAL | Status: DC | PRN
  Administered 2018-12-08 – 2018-12-09 (×2): 0.5 mg via ORAL
  Filled 2018-12-08 (×2): qty 1

## 2018-12-08 MED ORDER — PRAVASTATIN SODIUM 10 MG PO TABS
20.0000 mg | ORAL_TABLET | Freq: Every day | ORAL | Status: DC
  Administered 2018-12-08 – 2018-12-10 (×3): 20 mg via ORAL
  Filled 2018-12-08 (×3): qty 2

## 2018-12-08 MED ORDER — SODIUM CHLORIDE 0.9 % IV BOLUS
1000.0000 mL | Freq: Once | INTRAVENOUS | Status: AC
  Administered 2018-12-08: 1000 mL via INTRAVENOUS

## 2018-12-08 MED ORDER — MEMANTINE HCL 10 MG PO TABS
10.0000 mg | ORAL_TABLET | Freq: Two times a day (BID) | ORAL | Status: DC
  Administered 2018-12-08 – 2018-12-11 (×6): 10 mg via ORAL
  Filled 2018-12-08 (×6): qty 1

## 2018-12-08 MED ORDER — ACETAMINOPHEN 325 MG PO TABS
650.0000 mg | ORAL_TABLET | Freq: Four times a day (QID) | ORAL | Status: DC | PRN
  Administered 2018-12-08 – 2018-12-11 (×7): 650 mg via ORAL
  Filled 2018-12-08 (×7): qty 2

## 2018-12-08 MED ORDER — ONDANSETRON HCL 4 MG/2ML IJ SOLN: 4.0000 mg | Freq: Four times a day (QID) | INTRAMUSCULAR | Status: DC | PRN

## 2018-12-08 MED ORDER — RISAQUAD PO CAPS
1.0000 | ORAL_CAPSULE | Freq: Every day | ORAL | Status: DC
  Administered 2018-12-09 – 2018-12-11 (×3): 1 via ORAL
  Filled 2018-12-08 (×3): qty 1

## 2018-12-08 MED ORDER — TRAMADOL HCL 50 MG PO TABS
50.0000 mg | ORAL_TABLET | Freq: Four times a day (QID) | ORAL | Status: DC | PRN
  Administered 2018-12-08 – 2018-12-10 (×4): 50 mg via ORAL
  Filled 2018-12-08 (×5): qty 1

## 2018-12-08 MED ORDER — PHENAZOPYRIDINE HCL 100 MG PO TABS
95.0000 mg | ORAL_TABLET | Freq: Once | ORAL | Status: AC
  Administered 2018-12-08: 100 mg via ORAL
  Filled 2018-12-08: qty 1

## 2018-12-08 MED ORDER — QUETIAPINE FUMARATE 400 MG PO TABS
400.0000 mg | ORAL_TABLET | Freq: Every day | ORAL | Status: DC
  Administered 2018-12-08 – 2018-12-10 (×3): 400 mg via ORAL
  Filled 2018-12-08 (×3): qty 1

## 2018-12-08 MED ORDER — OXYBUTYNIN CHLORIDE 5 MG PO TABS: 2.5000 mg | ORAL_TABLET | Freq: Three times a day (TID) | ORAL | Status: DC | PRN

## 2018-12-08 MED ORDER — SODIUM CHLORIDE 0.9 % IV SOLN
500.0000 mg | INTRAVENOUS | Status: DC
  Administered 2018-12-08: 500 mg via INTRAVENOUS
  Filled 2018-12-08: qty 500

## 2018-12-08 MED ORDER — LINACLOTIDE 72 MCG PO CAPS
72.0000 ug | ORAL_CAPSULE | ORAL | Status: DC
  Administered 2018-12-10: 72 ug via ORAL
  Filled 2018-12-08 (×2): qty 1

## 2018-12-08 MED ORDER — ONDANSETRON HCL 4 MG PO TABS: 4.0000 mg | ORAL_TABLET | Freq: Four times a day (QID) | ORAL | Status: DC | PRN

## 2018-12-08 MED ORDER — CALCIUM CARBONATE 1500 (600 CA) MG PO TABS: 600.0000 mg | ORAL_TABLET | Freq: Every day | ORAL | Status: DC

## 2018-12-08 MED ORDER — TAMSULOSIN HCL 0.4 MG PO CAPS
0.4000 mg | ORAL_CAPSULE | Freq: Every day | ORAL | Status: DC
  Administered 2018-12-08 – 2018-12-10 (×3): 0.4 mg via ORAL
  Filled 2018-12-08 (×3): qty 1

## 2018-12-08 MED ORDER — ENOXAPARIN SODIUM 40 MG/0.4ML ~~LOC~~ SOLN
40.0000 mg | SUBCUTANEOUS | Status: DC
  Administered 2018-12-09 – 2018-12-10 (×2): 40 mg via SUBCUTANEOUS
  Filled 2018-12-08 (×2): qty 0.4

## 2018-12-08 MED ORDER — TIZANIDINE HCL 4 MG PO TABS
4.0000 mg | ORAL_TABLET | Freq: Two times a day (BID) | ORAL | Status: DC | PRN
  Filled 2018-12-08: qty 1

## 2018-12-08 MED ORDER — DIPHENHYDRAMINE HCL 50 MG/ML IJ SOLN
25.0000 mg | Freq: Once | INTRAMUSCULAR | Status: AC
  Administered 2018-12-08: 25 mg via INTRAVENOUS
  Filled 2018-12-08: qty 1

## 2018-12-08 MED ORDER — ACETAMINOPHEN 325 MG PO TABS
650.0000 mg | ORAL_TABLET | Freq: Once | ORAL | Status: AC
  Administered 2018-12-08: 650 mg via ORAL
  Filled 2018-12-08: qty 2

## 2018-12-08 NOTE — ED Provider Notes (Signed)
[facility] EMERGENCY DEPARTMENT Provider Note   CSN: [id number] Arrival date & time: 12/08/18  1349     History   Chief Complaint Chief Complaint  Patient presents with  . Tachycardia    HPI Kendra Simmons is a 67 y.o. female with history of GERD, HLD presents for evaluation of acute onset, progressively worsening right-sided facial swelling for 2 weeks sent from PCP for evaluation of tachycardia.  She notes that she has had swelling of her "glands" on the right side for the last 2 weeks or so and was seen by her PCP last Thursday 4 days ago and was diagnosed with sialoadenitis clinically.  She was sent home with Augmentin which she took once Thursday evening, then developed diarrhea the following day.  She discontinued the Augmentin Saturday morning and had 4 doses in total.  She notes that she had several episodes of watery nonbloody diarrhea on Friday and has not had a bowel movement since then.  She has had cramping abdominal pain worst along the midline with nausea and dry heaves but no emesis.  She also developed cough and some shortness of breath yesterday.  This morning she woke up with generalized myalgias and was reevaluated by her PCP who sent her to the ED for evaluation as she was found to be tachycardic and pale.  She notes a constant chest soreness along the ribs inferiorly as well as a generalized headache similar to headaches she has had in the past when she has had fevers.  Has taken hyoscyamine without significant relief.  Her PCP did switch her to Keflex but she has not taken any of these tablets due to concern from her GI upset.  The history is provided by the patient.    Past Medical History:  Diagnosis Date  . Anxiety   . Arthritis   . Depression   . Emotional depression 02/04/2015  . GERD (gastroesophageal reflux disease)   . High cholesterol   . Insomnia   . Memory loss     Patient Active Problem List   Diagnosis Date Noted  .  Pneumonia 12/08/2018  . Chronic bilateral low back pain 07/01/2017  . Dehydration 07/03/2015  . UTI (lower urinary tract infection) 07/03/2015  . Hypokalemia 07/03/2015  . AKI (acute kidney injury) ([place]) 07/03/2015  . Tachycardia 05/27/2015  . 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 Insomnia disorder, with non-sleep disorder mental comorbidity, 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  . 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 Snoring 12/24/2014  . Unintended weight loss 12/24/2014  . 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 Panic attacks 07/26/2014  . Thrombocytopenia ([place]) 12/17/2012  . DYSLIPIDEMIA 03/13/2010  . GERD 03/13/2010    Past Surgical History:  Procedure Laterality Date  . CESAREAN SECTION    . COSMETIC SURGERY    . ESOPHAGEAL MANOMETRY N/A 09/26/2016   Procedure: ESOPHAGEAL MANOMETRY (EM);  Surgeon: Clarene Essex, MD;  Location: [facility];  Service: Endoscopy;  Laterality: N/A;  . laproscopy       OB History   No obstetric history on file.      Home Medications    Prior to Admission medications   Medication Sig Start Date End Date Taking? Authorizing Provider  buPROPion (WELLBUTRIN XL) 150 MG 24 hr tablet TAKE 1 TABLET BY MOUTH EVERY DAY IN THE MORNING 10/17/18  Yes Arfeen, Arlyce Harman, MD  Calcium Carbonate (CALCIUM 600 PO) Take 1 tablet by mouth daily.  Yes [provider]  clonazePAM (KLONOPIN) 0.5 MG tablet TAKE 1 TABLET BY MOUTH EVERY DAY AS NEEDED FOR ANXIETY (11/05) Patient taking differently: Take 0.5 mg by mouth daily as needed for anxiety.  11/18/18  Yes Arfeen, Arlyce Harman, MD  diclofenac sodium (VOLTAREN) 1 % GEL Apply 2 g topically 4 (four) times daily. 06/04/18  Yes Mcarthur Rossetti, MD  doxepin (SINEQUAN) 75 MG capsule Take 1 capsule (75 mg total) by mouth at bedtime. 10/17/18  Yes Arfeen, Arlyce Harman, MD  ibuprofen (ADVIL,MOTRIN) 200 MG tablet Take 400 mg by mouth 2 (two) times daily.    Yes [provider]  linaclotide (LINZESS) 72 MCG capsule Take 72-145 mcg by mouth every other day.    Yes Clarene Essex, MD  memantine (NAMENDA) 10 MG tablet Take 1 tablet (10 mg total) by mouth 2 (two) times daily. 06/02/15  Yes Rankin, Shuvon B, NP  Multiple Vitamins-Minerals (MULTIVITAMIN PO) Take 1 tablet by mouth daily.   Yes [provider]  omeprazole (PRILOSEC) 40 MG capsule Take 40 mg by mouth daily.  09/06/16  Yes [provider]  pravastatin (PRAVACHOL) 20 MG tablet Take 1 tablet (20 mg total) by mouth at bedtime. For high cholesterol 06/02/15  Yes Rankin, Shuvon B, NP  pregabalin (LYRICA) 75 MG capsule Take 1 capsule (75 mg total) by mouth at bedtime. 10/17/18 10/17/19 Yes Arfeen, Arlyce Harman, MD  Probiotic Product (PROBIOTIC PO) Take 1 tablet by mouth daily.   Yes [provider]  QUEtiapine (SEROQUEL) 400 MG tablet Take 1 tablet (400 mg total) by mouth at bedtime. 10/17/18  Yes Arfeen, Arlyce Harman, MD  ramelteon (ROZEREM) 8 MG tablet Take 1 tablet (8 mg total) by mouth at bedtime. 10/17/18  Yes Arfeen, Arlyce Harman, MD  tamsulosin (FLOMAX) 0.4 MG CAPS capsule Take 1 capsule (0.4 mg total) by mouth daily after supper. For frequent urination/urgency 06/02/15  Yes Rankin, Shuvon B, NP  tiZANidine (ZANAFLEX) 4 MG capsule Take 1 capsule (4 mg total) by mouth 2 (two) times daily as needed for muscle spasms. 12/04/18  Yes Mcarthur Rossetti, MD  traMADol (ULTRAM) 50 MG tablet Take 1 tablet (50 mg total) by mouth every 6 (six) hours as needed. 09/10/18  Yes Mcarthur Rossetti, MD  HYDROcodone-acetaminophen (NORCO/VICODIN) 5-325 MG tablet Take 1 tablet by mouth every 6 (six) hours as needed for moderate pain. Patient not taking: Reported on 12/08/2018 06/25/18   Mcarthur Rossetti, MD  meloxicam (MOBIC) 7.5 MG tablet TAKE 1 TABLET BY MOUTH TWICE A DAY Patient not taking: Reported on 12/08/2018 08/16/17   Mcarthur Rossetti, MD  Misc. Devices ([place])  MISC Use walker daily to remain non-weight-bearing 05/09/18   Wurst, [place], PA-C    Family History Family History  Problem Relation Age of Onset  . Sleep apnea Father   . Alcohol abuse Father   . Diabetes Mother   . Diabetes Sister   . Diabetes Maternal Uncle   . Diabetes Cousin     Social History Social History   Tobacco Use  . Smoking status: Never Smoker  . Smokeless tobacco: Never Used  Substance Use Topics  . Alcohol use: No    Alcohol/week: 0.0 standard drinks    Comment: zero  . Drug use: No    Types: Barbituates, Benzodiazepines    Comment: Denies any drug use other than benzos     Allergies   Phentermine and Augmentin [amoxicillin-pot clavulanate]   Review of Systems Review of  Systems  Constitutional: Positive for chills and fever.  Eyes: Negative for photophobia and visual disturbance.  Respiratory: Positive for cough and shortness of breath.   Cardiovascular: Positive for chest pain.  Gastrointestinal: Positive for abdominal pain and nausea.  Genitourinary: Positive for decreased urine volume. Negative for frequency and urgency.  Musculoskeletal: Positive for myalgias.  Neurological: Positive for headaches. Negative for light-headedness.  All other systems reviewed and are negative.    Physical Exam Updated Vital Signs BP (!) 118/57   Pulse 98   Temp (S) (!) 101.9 F (38.8 C) (Oral)   Resp 19   Ht 5\' 2"  (1.575 m)   Wt 90 kg   SpO2 96%   BMI 36.29 kg/m   Physical Exam Vitals signs and nursing note reviewed.  Constitutional:      General: She is not in acute distress.    Appearance: She is well-developed.  HENT:     Head: Normocephalic and atraumatic.     Jaw: There is normal jaw occlusion. No trismus.     Salivary Glands: Right salivary gland is diffusely enlarged and tender.     Right Ear: A middle ear effusion is present. No mastoid tenderness.     Left Ear: A middle ear effusion is present. No mastoid tenderness.     Nose: Nose  normal.     Right Sinus: No maxillary sinus tenderness or frontal sinus tenderness.     Left Sinus: No maxillary sinus tenderness or frontal sinus tenderness.     Mouth/Throat:     Mouth: Mucous membranes are dry.     Comments: Tolerating secretions without difficulty.  No [place] spots noted. Eyes:     General:        Right eye: No discharge.        Left eye: No discharge.     Conjunctiva/sclera: Conjunctivae normal.  Neck:     Musculoskeletal: Normal range of motion and neck supple. No neck rigidity.     Vascular: No JVD.     Trachea: Trachea and phonation normal. No tracheal deviation.     Meningeal: Brudzinski's sign and Kernig's sign absent.  Cardiovascular:     Rate and Rhythm: Tachycardia present.     Pulses: Normal pulses.     Heart sounds: Normal heart sounds.  Pulmonary:     Effort: Pulmonary effort is normal.     Breath sounds: Normal breath sounds.  Chest:     Chest wall: Tenderness present.  Abdominal:     General: Bowel sounds are increased. There is no distension.     Tenderness: There is abdominal tenderness in the epigastric area. There is no right CVA tenderness, left CVA tenderness, guarding or rebound. Negative signs include Murphy's sign and Rovsing's sign.  Lymphadenopathy:     Cervical: Cervical adenopathy present.  Skin:    Findings: No erythema.  Neurological:     Mental Status: She is alert.  Psychiatric:        Behavior: Behavior normal.      ED Treatments / Results  Labs (all labs ordered are listed, but only abnormal results are displayed) Labs Reviewed  COMPREHENSIVE METABOLIC PANEL - Abnormal; Notable for the following components:      Result Value   Glucose, Bld 160 (*)    Creatinine, Ser 1.01 (*)    GFR calc non Af Amer 58 (*)    All other components within normal limits  CBC WITH DIFFERENTIAL/PLATELET - Abnormal; Notable for the following components:   Platelets  122 (*)    Lymphs Abs 0.4 (*)    All other components within normal  limits  I-STAT CG4 LACTIC ACID, ED - Abnormal; Notable for the following components:   Lactic Acid, Venous 2.06 (*)    All other components within normal limits  I-STAT CG4 LACTIC ACID, ED - Abnormal; Notable for the following components:   Lactic Acid, Venous 2.28 (*)    All other components within normal limits  C DIFFICILE QUICK SCREEN W PCR REFLEX  GASTROINTESTINAL PANEL BY PCR, STOOL (REPLACES STOOL CULTURE)  CULTURE, BLOOD (ROUTINE X 2)  CULTURE, BLOOD (ROUTINE X 2)  URINALYSIS, ROUTINE W REFLEX MICROSCOPIC  INFLUENZA PANEL BY PCR (TYPE A & B)  HIV ANTIBODY (ROUTINE TESTING W REFLEX)  CBC  CREATININE, SERUM  CBC  BASIC METABOLIC PANEL    EKG None  Radiology Dg Chest 2 View  Result Date: 12/08/2018 CLINICAL DATA:  Cough and congestion. EXAM: CHEST - 2 VIEW COMPARISON:  11/24/2014 FINDINGS: The heart size and mediastinal contours are within normal limits. Subtle airspace opacity in the right upper lobe which appears partially nodular in appearance. Findings may be consistent with acute pneumonia. Follow-up recommended to exclude potential underlying pulmonary nodule(s). There is no evidence of pulmonary edema, pneumothorax or pleural fluid. The visualized skeletal structures are unremarkable. IMPRESSION: Airspace opacity in the right upper lobe which appears somewhat nodular in appearance. Findings may be consistent with acute pneumonia. Follow-up recommended to exclude potential underlying pulmonary nodule(s). Followup PA and lateral chest X-ray is recommended in 3-4 weeks following trial of antibiotic therapy to ensure resolution and exclude underlying malignancy. Electronically Signed   By: Aletta Edouard M.D.   On: 12/08/2018 15:47    Procedures .Critical Care Performed by: Renita Papa, PA-C Authorized by: Renita Papa, PA-C   Critical care provider statement:    Critical care time (minutes):  40   Critical care was necessary to treat or prevent imminent or  life-threatening deterioration of the following conditions:  Sepsis   Critical care was time spent personally by me on the following activities:  Discussions with consultants, evaluation of patient's response to treatment, examination of patient, ordering and performing treatments and interventions, ordering and review of laboratory studies, ordering and review of radiographic studies, pulse oximetry, re-evaluation of patient's condition, obtaining history from patient or surrogate and review of old charts   I assumed direction of critical care for this patient from another provider in my specialty: no     (including critical care time)  Medications Ordered in ED Medications  azithromycin (ZITHROMAX) 500 mg in sodium chloride 0.9 % 250 mL IVPB (0 mg Intravenous Stopped 12/08/18 1803)  traMADol (ULTRAM) tablet 50 mg (has no administration in time range)  pravastatin (PRAVACHOL) tablet 20 mg (has no administration in time range)  buPROPion (WELLBUTRIN XL) 24 hr tablet 150 mg (has no administration in time range)  doxepin (SINEQUAN) capsule 75 mg (has no administration in time range)  memantine (NAMENDA) tablet 10 mg (has no administration in time range)  QUEtiapine (SEROQUEL) tablet 400 mg (has no administration in time range)  ramelteon (ROZEREM) tablet 8 mg (has no administration in time range)  linaclotide (LINZESS) capsule 72-145 mcg (has no administration in time range)  pantoprazole (PROTONIX) EC tablet 40 mg (has no administration in time range)  Probiotic CAPS (has no administration in time range)  tamsulosin (FLOMAX) capsule 0.4 mg (has no administration in time range)  clonazePAM (KLONOPIN) tablet 0.5 mg (has no administration  in time range)  pregabalin (LYRICA) capsule 75 mg (has no administration in time range)  tiZANidine (ZANAFLEX) capsule 4 mg (has no administration in time range)  calcium carbonate (OSCAL) tablet 1,500 mg (has no administration in time range)  enoxaparin  (LOVENOX) injection 40 mg (has no administration in time range)  ondansetron (ZOFRAN) tablet 4 mg (has no administration in time range)    Or  ondansetron (ZOFRAN) injection 4 mg (has no administration in time range)  clindamycin (CLEOCIN) IVPB 300 mg (has no administration in time range)  0.9 %  sodium chloride infusion (has no administration in time range)  acetaminophen (TYLENOL) tablet 650 mg (650 mg Oral Given 12/08/18 1450)  sodium chloride 0.9 % bolus 1,000 mL (0 mLs Intravenous Stopped 12/08/18 1830)    And  sodium chloride 0.9 % bolus 1,000 mL (0 mLs Intravenous Stopped 12/08/18 1830)  sodium chloride 0.9 % bolus 1,000 mL (0 mLs Intravenous Stopped 12/08/18 1802)  dicyclomine (BENTYL) capsule 10 mg (10 mg Oral Given 12/08/18 1854)  phenazopyridine (PYRIDIUM) tablet 100 mg (100 mg Oral Given 12/08/18 1854)  prochlorperazine (COMPAZINE) injection 10 mg (10 mg Intravenous Given 12/08/18 1854)  diphenhydrAMINE (BENADRYL) injection 25 mg (25 mg Intravenous Given 12/08/18 1854)     Initial Impression / Assessment and Plan / ED Course  I have reviewed the triage vital signs and the nursing notes.  Pertinent labs & imaging results that were available during my care of the patient were reviewed by me and considered in my medical decision making (see chart for details).     Patient presenting for evaluation sent from her PCP due to tachycardia and fever.  Recently treated for right-sided sialoadenitis but was unable to tolerate the Augmentin due to GI discomfort and diarrhea.  She is febrile and tachycardic in the ED with improvement on reevaluation.  I did personally reassess her temperature which was 99.8 though a repeat temperature was not documented in the ED.  No meningeal signs on examination, doubt meningitis.  She is also tolerating secretions without difficulty, no evidence of airway obstruction or angioedema.  I do not feel she needs emergent imaging of the neck at this time and I  have a low suspicion of retropharyngeal abscess or deep space neck infection.  We will obtain lab work, chest x-ray, and reassess.  Lab work significant for elevated lactate, mildly elevated creatinine, no metabolic derangements.  She does appear clinically dehydrated.  Code sepsis was initiated and she was given broad-spectrum antibiotics in the ED.  Chest x-ray does show evidence of right upper lobe pneumonia.  Influenza test negative.  On reassessment, patient is complaining of headache and lower abdominal discomfort.  We will give migraine cocktail, Bentyl, and Azo.  Spoke with Dr. Earnest Conroy with [facility] hospitalist service who agrees to assume care of patient and bring her into the hospital for further evaluation and management.  Critical care services were provided during the encounter.  Final Clinical Impressions(s) / ED Diagnoses   Final diagnoses:  Sepsis without acute organ dysfunction, due to unspecified organism [facility])  Community acquired pneumonia of right upper lobe of lung [facility])    ED Discharge Orders    None       Debroah Baller 12/08/18 2010    Tegeler, Gwenyth Allegra, MD 12/09/18 [phone]

## 2018-12-08 NOTE — ED Notes (Signed)
Sats maintained 96-100% RA while ambulating, pt denies SOB

## 2018-12-08 NOTE — H&P (Signed)
History and Physical    DOA: 12/08/2018  PCP: Donald Prose, MD  Patient coming from: Home  Chief Complaint: Vomiting, diarrhea, fever and cough  HPI: Kendra Simmons is a 67 y.o. female with history h/o anxiety, depression, severe GERD on PPI, dysphagia status post recent esophageal dilatation presents today with above complaints.  Patient states she was in her usual state of health until last week when she developed right facial swelling and presented past Thursday to PCP office where she was diagnosed with sialadenitis and prescribed Augmentin.  Patient states she took 2 pills of Augmentin and had severe GI intolerance with nausea vomiting and diarrhea. She stopped taking the medication.  She developed cough,/fever of 95 F and went to see her PCP who referred her for further evaluation. She states she has chronic aspiration from severe GERD.  She recently underwent EGD and colonoscopy and was told to have esophageal dysmotility, apparently underwent dilatation and was advised that she might need Botox injections in the future.  She was found to have colon polyps and hemorrhoids as well.  Additionally she has experienced severe bladder spasms and dysuria since this morning.  She states she has had similar symptoms with UTIs in the past.  In the ED revealed T-max of 101.9, normal WBC, right upper lobe infiltrate on chest x-ray.  Patient received Rocephin and Zithromax in the ED.  Additionally she complained of frontal headache while here and received migraine cocktail.  She is requested to be admitted for further evaluation and management.  Review of Systems: As per HPI otherwise 10 point review of systems negative.    Past Medical History:  Diagnosis Date  . Anxiety   . Arthritis   . Depression   . Emotional depression 02/04/2015  . GERD (gastroesophageal reflux disease)   . High cholesterol   . Insomnia   . Memory loss     Past Surgical History:  Procedure Laterality Date  .  CESAREAN SECTION    . COSMETIC SURGERY    . ESOPHAGEAL MANOMETRY N/A 09/26/2016   Procedure: ESOPHAGEAL MANOMETRY (EM);  Surgeon: Clarene Essex, MD;  Location: [facility];  Service: Endoscopy;  Laterality: N/A;  . laproscopy      Social history:  reports that she has never smoked. She has never used smokeless tobacco. She reports that she does not drink alcohol or use drugs.   Allergies  Allergen Reactions  . Phentermine Anxiety  . Augmentin [Amoxicillin-Pot Clavulanate] Diarrhea    Family History  Problem Relation Age of Onset  . Sleep apnea Father   . Alcohol abuse Father   . Diabetes Mother   . Diabetes Sister   . Diabetes Maternal Uncle   . Diabetes Cousin       Prior to Admission medications   Medication Sig Start Date End Date Taking? Authorizing Provider  buPROPion (WELLBUTRIN XL) 150 MG 24 hr tablet TAKE 1 TABLET BY MOUTH EVERY DAY IN THE MORNING 10/17/18  Yes Arfeen, Arlyce Harman, MD  Calcium Carbonate (CALCIUM 600 PO) Take 1 tablet by mouth daily.   Yes [provider]  clonazePAM (KLONOPIN) 0.5 MG tablet TAKE 1 TABLET BY MOUTH EVERY DAY AS NEEDED FOR ANXIETY (11/05) Patient taking differently: Take 0.5 mg by mouth daily as needed for anxiety.  11/18/18  Yes Arfeen, Arlyce Harman, MD  diclofenac sodium (VOLTAREN) 1 % GEL Apply 2 g topically 4 (four) times daily. 06/04/18  Yes Mcarthur Rossetti, MD  doxepin (SINEQUAN) 75 MG capsule Take 1  capsule (75 mg total) by mouth at bedtime. 10/17/18  Yes Arfeen, Arlyce Harman, MD  ibuprofen (ADVIL,MOTRIN) 200 MG tablet Take 400 mg by mouth 2 (two) times daily.   Yes [provider]  linaclotide (LINZESS) 72 MCG capsule Take 72-145 mcg by mouth every other day.    Yes Clarene Essex, MD  memantine (NAMENDA) 10 MG tablet Take 1 tablet (10 mg total) by mouth 2 (two) times daily. 06/02/15  Yes Rankin, Shuvon B, NP  Multiple Vitamins-Minerals (MULTIVITAMIN PO) Take 1 tablet by mouth daily.   Yes [provider]  omeprazole  (PRILOSEC) 40 MG capsule Take 40 mg by mouth daily.  09/06/16  Yes [provider]  pravastatin (PRAVACHOL) 20 MG tablet Take 1 tablet (20 mg total) by mouth at bedtime. For high cholesterol 06/02/15  Yes Rankin, Shuvon B, NP  pregabalin (LYRICA) 75 MG capsule Take 1 capsule (75 mg total) by mouth at bedtime. 10/17/18 10/17/19 Yes Arfeen, Arlyce Harman, MD  Probiotic Product (PROBIOTIC PO) Take 1 tablet by mouth daily.   Yes [provider]  QUEtiapine (SEROQUEL) 400 MG tablet Take 1 tablet (400 mg total) by mouth at bedtime. 10/17/18  Yes Arfeen, Arlyce Harman, MD  ramelteon (ROZEREM) 8 MG tablet Take 1 tablet (8 mg total) by mouth at bedtime. 10/17/18  Yes Arfeen, Arlyce Harman, MD  tamsulosin (FLOMAX) 0.4 MG CAPS capsule Take 1 capsule (0.4 mg total) by mouth daily after supper. For frequent urination/urgency 06/02/15  Yes Rankin, Shuvon B, NP  tiZANidine (ZANAFLEX) 4 MG capsule Take 1 capsule (4 mg total) by mouth 2 (two) times daily as needed for muscle spasms. 12/04/18  Yes Mcarthur Rossetti, MD  traMADol (ULTRAM) 50 MG tablet Take 1 tablet (50 mg total) by mouth every 6 (six) hours as needed. 09/10/18  Yes Mcarthur Rossetti, MD  HYDROcodone-acetaminophen (NORCO/VICODIN) 5-325 MG tablet Take 1 tablet by mouth every 6 (six) hours as needed for moderate pain. Patient not taking: Reported on 12/08/2018 06/25/18   Mcarthur Rossetti, MD  meloxicam (MOBIC) 7.5 MG tablet TAKE 1 TABLET BY MOUTH TWICE A DAY Patient not taking: Reported on 12/08/2018 08/16/17   Mcarthur Rossetti, MD  Misc. Devices (WALKER GLIDE WHEELS) MISC Use walker daily to remain non-weight-bearing 05/09/18   Stacey Drain [place], [place]    Physical Exam: Vitals:   12/08/18 1730 12/08/18 1745 12/08/18 1800 12/08/18 1845  BP: (!) 126/56 123/69 112/69 (!) 128/59  Pulse: (!) 108 (!) 107 99 (!) 103  Resp: (!) 21 17 17  (!) 23  Temp:      TempSrc:      SpO2: 99% 97% 97% 95%  Weight:      Height:        Constitutional:  Appears anxious but not in any respiratory distress and saturating well on room air.   Eyes: PERRL, lids and conjunctivae normal ENMT: Mucous membranes are moist. Posterior pharynx clear of any exudate or lesions.Normal dentition.  Neck: normal, supple, no masses, no thyromegaly Respiratory: clear to auscultation bilaterally, no wheezing, no crackles. Normal respiratory effort. No accessory muscle use.  Cardiovascular: Regular rate and rhythm, no murmurs / rubs / gallops. No extremity edema. 2+ pedal pulses. No carotid bruits.  Abdomen: no tenderness, no masses palpated. No hepatosplenomegaly. Bowel sounds positive.  Musculoskeletal: no clubbing / cyanosis. No joint deformity upper and lower extremities. Good ROM, no contractures. Normal muscle tone.  Neurologic: CN 2-12 grossly intact. Sensation intact, DTR normal. Strength 5/5 in all 4.  Psychiatric: Normal judgment and insight. Alert and oriented x 3. Normal mood.  SKIN/catheters: no rashes, lesions, ulcers. No induration  Labs on Admission: I have personally reviewed following labs and imaging studies  CBC: Recent Labs  Lab 12/08/18 1430  WBC 7.1  NEUTROABS 6.2  HGB 13.8  HCT 42.5  MCV 96.6  PLT 756*   Basic Metabolic Panel: Recent Labs  Lab 12/08/18 1430  NA 142  K 4.3  CL 108  CO2 23  GLUCOSE 160*  BUN 15  CREATININE 1.01*  CALCIUM 9.4   GFR: Estimated Creatinine Clearance: 56.4 mL/min (A) (by C-G formula based on SCr of 1.01 mg/dL (H)). Liver Function Tests: Recent Labs  Lab 12/08/18 1430  AST 34  ALT 23  ALKPHOS 56  BILITOT 0.7  PROT 7.2  ALBUMIN 4.4   No results for input(s): LIPASE, AMYLASE in the last 168 hours. No results for input(s): AMMONIA in the last 168 hours. Coagulation Profile: No results for input(s): INR, PROTIME in the last 168 hours. Cardiac Enzymes: No results for input(s): CKTOTAL, CKMB, CKMBINDEX, TROPONINI in the last 168 hours. BNP (last 3 results) No results for input(s): PROBNP  in the last 8760 hours. HbA1C: No results for input(s): HGBA1C in the last 72 hours. CBG: No results for input(s): GLUCAP in the last 168 hours. Lipid Profile: No results for input(s): CHOL, HDL, LDLCALC, TRIG, CHOLHDL, LDLDIRECT in the last 72 hours. Thyroid Function Tests: No results for input(s): TSH, T4TOTAL, FREET4, T3FREE, THYROIDAB in the last 72 hours. Anemia Panel: No results for input(s): VITAMINB12, FOLATE, FERRITIN, TIBC, IRON, RETICCTPCT in the last 72 hours. Urine analysis:    Component Value Date/Time   COLORURINE YELLOW 12/08/2018 1855   APPEARANCEUR CLEAR 12/08/2018 1855   LABSPEC 1.015 12/08/2018 1855   PHURINE 6.0 12/08/2018 1855   GLUCOSEU NEGATIVE 12/08/2018 1855   HGBUR NEGATIVE 12/08/2018 1855   BILIRUBINUR NEGATIVE 12/08/2018 1855   KETONESUR NEGATIVE 12/08/2018 1855   PROTEINUR NEGATIVE 12/08/2018 1855   UROBILINOGEN 0.2 07/03/2015 1420   NITRITE NEGATIVE 12/08/2018 1855   LEUKOCYTESUR NEGATIVE 12/08/2018 1855    Radiological Exams on Admission: Dg Chest 2 View  Result Date: 12/08/2018 CLINICAL DATA:  Cough and congestion. EXAM: CHEST - 2 VIEW COMPARISON:  11/24/2014 FINDINGS: The heart size and mediastinal contours are within normal limits. Subtle airspace opacity in the right upper lobe which appears partially nodular in appearance. Findings may be consistent with acute pneumonia. Follow-up recommended to exclude potential underlying pulmonary nodule(s). There is no evidence of pulmonary edema, pneumothorax or pleural fluid. The visualized skeletal structures are unremarkable. IMPRESSION: Airspace opacity in the right upper lobe which appears somewhat nodular in appearance. Findings may be consistent with acute pneumonia. Follow-up recommended to exclude potential underlying pulmonary nodule(s). Followup PA and lateral chest X-ray is recommended in 3-4 weeks following trial of antibiotic therapy to ensure resolution and exclude underlying malignancy.  Electronically Signed   By: Aletta Edouard M.D.   On: 12/08/2018 15:47    Assessment and Plan:   1.  Right upper lobe pneumonia: Chest x-ray findings could be secondary to aspiration/pneumonitis.  No leukocytosis.  Saturating well on room air.  Did not tolerate Augmentin as outpatient.  Will admit with IV clindamycin which can be likely transition to oral if tolerates well.  She did receive 1 dose of Rocephin and Zithromax in the ED but presentation seems to suggest aspiration.  Risk factors include severe GERD, recent esophageal dilatation, salivary gland infection, vomiting after Augmentin  intake and multiple psych medications on board.  2.  Bladder spasm/dysuria: Will check UA.  Patient did receive 1 dose of Rocephin in the ED.  Can resume gram-negative coverage if work-up reveals UTI.  He is on tamsulosin at baseline.  Try Ditropan for severe bladder spasms.   3.  Anxiety/depression: Resume home medications  4.  GERD/esophageal dysmotility: PPI.  Follow-up with GI upon discharge  5.  Headache: Likely stress headache.  Received migraine cocktail in the ED.  PRN analgesics.  DVT prophylaxis: Lovenox  Code Status: Full code  Family Communication: Discussed with patient. Health care proxy would be her Sister [name] but she is also thinking of appointing her friend. Consults called: None Admission status:  Patient admitted as observation status anticipated LOS less than 2 midnights    Guilford Shi MD [facility] Pager [phone]  If 7PM-7AM, please contact night-coverage [email or web address] Password [facility]  12/08/2018, 7:49 PM

## 2018-12-08 NOTE — ED Triage Notes (Addendum)
Per [facility], pt sent by [facility] physicians for tachycardia. Pt started on abx last week for abcess r/t salivary gland. Pt stopped taking abx due to diarrhea. MD ordered her a new abx which she did not start. Pt endorses SOB on exertion with some right rib cage pain. Pt had dizziness upon standing. Pt also has lower abdominal pain and dysuria. Pt placed on 2L [place] via [facility], pt not on home O2.   EMS VS 122/90 98% [place] 2L, 94% RA 180 CBG 18 RR

## 2018-12-09 ENCOUNTER — Encounter (HOSPITAL_COMMUNITY): Payer: Self-pay | Admitting: Anesthesiology

## 2018-12-09 DIAGNOSIS — J181 Lobar pneumonia, unspecified organism: Secondary | ICD-10-CM | POA: Diagnosis not present

## 2018-12-09 LAB — BASIC METABOLIC PANEL
ANION GAP: 11 (ref 5–15)
BUN: 10 mg/dL (ref 8–23)
CALCIUM: 8.1 mg/dL — AB (ref 8.9–10.3)
CO2: 21 mmol/L — ABNORMAL LOW (ref 22–32)
Chloride: 111 mmol/L (ref 98–111)
Creatinine, Ser: 0.8 mg/dL (ref 0.44–1.00)
GFR calc Af Amer: >60 mL/min (ref >60)
GFR calc non Af Amer: >60 mL/min (ref >60)
Glucose, Bld: 128 mg/dL — ABNORMAL HIGH (ref 70–99)
Potassium: 3.7 mmol/L (ref 3.5–5.1)
Sodium: 143 mmol/L (ref 135–145)

## 2018-12-09 LAB — CBC
HCT: 30.8 % — ABNORMAL LOW (ref 36.0–46.0)
Hemoglobin: 9.8 g/dL — ABNORMAL LOW (ref 12.0–15.0)
MCH: 30.2 pg (ref 26.0–34.0)
MCHC: 31.8 g/dL (ref 30.0–36.0)
MCV: 95.1 fL (ref 80.0–100.0)
NRBC: 0 % (ref 0.0–0.2)
Platelets: 107 10*3/uL — ABNORMAL LOW (ref 150–400)
RBC: 3.24 MIL/uL — ABNORMAL LOW (ref 3.87–5.11)
RDW: 13.2 % (ref 11.5–15.5)
WBC: 8.1 10*3/uL (ref 4.0–10.5)

## 2018-12-09 LAB — HIV ANTIBODY (ROUTINE TESTING W REFLEX): HIV Screen 4th Generation wRfx: NONREACTIVE

## 2018-12-09 MED ORDER — TIZANIDINE HCL 4 MG PO TABS
4.0000 mg | ORAL_TABLET | Freq: Three times a day (TID) | ORAL | Status: DC | PRN
  Administered 2018-12-09 – 2018-12-11 (×3): 4 mg via ORAL
  Filled 2018-12-09 (×4): qty 1

## 2018-12-09 MED ORDER — SODIUM CHLORIDE 0.9 % IV SOLN
1.0000 g | INTRAVENOUS | Status: DC
  Administered 2018-12-09: 1 g via INTRAVENOUS
  Filled 2018-12-09 (×2): qty 10

## 2018-12-09 MED ORDER — METRONIDAZOLE 500 MG PO TABS
500.0000 mg | ORAL_TABLET | Freq: Three times a day (TID) | ORAL | Status: DC
  Administered 2018-12-09 – 2018-12-11 (×6): 500 mg via ORAL
  Filled 2018-12-09 (×6): qty 1

## 2018-12-09 MED ORDER — SUCRALFATE 1 GM/10ML PO SUSP
1.0000 g | Freq: Three times a day (TID) | ORAL | Status: DC
  Administered 2018-12-09 – 2018-12-11 (×8): 1 g via ORAL
  Filled 2018-12-09 (×8): qty 10

## 2018-12-09 NOTE — Care Management Obs Status (Addendum)
[place] NOTIFICATION   Patient Details  Name: Kendra Simmons MRN: [id number] Date of Birth: Jun 01, 1951   Medicare Observation Status Notification Given:  Yes  Patient on enteric precautions and gave CM permission to sign.   Georgeanna Lea, RNCM 12/09/2018, 2:11 PM

## 2018-12-09 NOTE — Progress Notes (Signed)
Patient arrived to 2W08 via stretcher with ED RN. Patient ambulatory to floor bed. Patient with 1 PIV in L forearm. AAOX4, headache present along with temperature 99.4. Schorr, NP paged regarding tylenol. See orders. See flowsheets for initial assessment. VSS. Will continue to closely monitor.

## 2018-12-09 NOTE — Progress Notes (Addendum)
[facility] Progress Note  Patient: Kendra Simmons QVZ:[id number]   PCP: Donald Prose, MD DOB: 1951/03/03   DOA: 12/08/2018   DOS: 12/09/2018   Date of Service: the patient was seen and examined on 12/09/2018  Brief hospital course: Pt. with PMH of anxiety, depression, severe GERD on PPI, dysphagia recent esophageal dilatation; admitted on 12/08/2018, presented with complaint of vomiting and diarrhea as well as cough, was found to have aspiration pneumonitis. Currently further plan is continue IV antibiotics.  Subjective: Feeling better, no further nausea or vomiting.  Reports that she has some reflux sensation every time she tries to eat.  No diarrhea reported.  Assessment and Plan: 1.  Right upper lobe pneumonia Aspiration pneumonia. sialoadenitis  Sepsis ruled out.  Patient recently saw PCP for sialoadenitis, was prescribed Augmentin and then started having nausea vomiting and diarrhea. Comes to the hospital with fever and cough. No diarrhea reported in last 24 hours. Chest x-ray shows evidence of right upper lobe consolidation. Influenza PCR is negative Blood cultures so far negative Started on IV clindamycin.  I will change to IV ceftriaxone and Flagyl to cover for both infection.  2.  GERD Esophageal dysmotility Continue PPI. Add Carafate.  3.  Anxiety and depression. Continue home medication.  4. Anemia Unclear etiology. Patient refuses any bleeding. Likely her admission hemoglobin of 13.8 was hemoconcentration. Her baseline is around 11. We will recheck labs tomorrow along with anemia panel.  5. Body mass index is 36.29 kg/m.  Dietary consult.  Diet: low salth diet DVT Prophylaxis: subcutaneous Heparin  Advance goals of care discussion: full code  Family Communication: no family was present at bedside, at the time of interview.   Disposition:  Discharge to home tomorrow 12/10/18 .  Consultants: none Procedures: none  Scheduled Meds: .  acidophilus  1 capsule Oral Daily  . buPROPion  150 mg Oral Daily  . calcium carbonate  1,250 mg Oral Q breakfast  . doxepin  75 mg Oral QHS  . enoxaparin (LOVENOX) injection  40 mg Subcutaneous Q24H  . linaclotide  72 mcg Oral QODAY  . memantine  10 mg Oral BID  . metroNIDAZOLE  500 mg Oral Q8H  . pantoprazole  40 mg Oral Daily  . pravastatin  20 mg Oral QHS  . pregabalin  75 mg Oral QHS  . QUEtiapine  400 mg Oral QHS  . ramelteon  8 mg Oral QHS  . tamsulosin  0.4 mg Oral QPC supper   Continuous Infusions: . sodium chloride 75 mL/hr at 12/09/18 1111  . cefTRIAXone (ROCEPHIN)  IV 1 g (12/09/18 1108)   PRN Meds: acetaminophen, clonazePAM, ondansetron **OR** ondansetron (ZOFRAN) IV, oxybutynin, tiZANidine, traMADol Antibiotics: Anti-infectives (From admission, onward)   Start     Dose/Rate Route Frequency Ordered Stop   12/09/18 1000  cefTRIAXone (ROCEPHIN) 1 g in sodium chloride 0.9 % 100 mL IVPB     1 g 200 mL/hr over 30 Minutes Intravenous Every 24 hours 12/09/18 0912     12/09/18 1000  metroNIDAZOLE (FLAGYL) tablet 500 mg     500 mg Oral Every 8 hours 12/09/18 0912     12/08/18 2100  clindamycin (CLEOCIN) IVPB 300 mg  Status:  Discontinued     300 mg 100 mL/hr over 30 Minutes Intravenous Every 6 hours 12/08/18 1929 12/09/18 0912   12/08/18 1630  cefTRIAXone (ROCEPHIN) 2 g in sodium chloride 0.9 % 100 mL IVPB  Status:  Discontinued     2 g 200 mL/hr over  30 Minutes Intravenous Every 24 hours 12/08/18 1626 12/08/18 1930   12/08/18 1630  azithromycin (ZITHROMAX) 500 mg in sodium chloride 0.9 % 250 mL IVPB  Status:  Discontinued     500 mg 250 mL/hr over 60 Minutes Intravenous Every 24 hours 12/08/18 1626 12/08/18 2007       Objective: Physical Exam: Vitals:   12/08/18 2041 12/09/18 0006 12/09/18 0736 12/09/18 1600  BP: 128/61 (!) 109/52 121/68 113/64  Pulse: 93 (!) 104 (!) 102 90  Resp: 18 12 18 16   Temp: 100 F (37.8 C) 98.8 F (37.1 C) 99.9 F (37.7 C) 98.5 F (36.9  C)  TempSrc: Oral Oral Oral Oral  SpO2: 98% 97% 97% 97%  Weight:      Height:        Intake/Output Summary (Last 24 hours) at 12/09/2018 1738 Last data filed at 12/09/2018 1500 Gross per 24 hour  Intake 4067.78 ml  Output 1 ml  Net 4066.78 ml   Filed Weights   12/08/18 1526  Weight: 90 kg   General: Alert, Awake and Oriented to Time, Place and Person. Appear in mild distress, affect appropriate Eyes: PERRL, Conjunctiva normal ENT: Oral Mucosa clear moist. Neck: no JVD, no Abnormal Mass Or lumps Cardiovascular: S1 and S2 Present, no Murmur, Peripheral Pulses Present Respiratory: normal respiratory effort, Bilateral Air entry equal and Decreased, no use of accessory muscle, right basal Crackles, no wheezes Abdomen: Bowel Sound present, Soft and no tenderness, no hernia Skin: no redness, no Rash, no induration Extremities: no Pedal edema, no calf tenderness Neurologic: Grossly no focal neuro deficit. Bilaterally Equal motor strength  Data Reviewed: CBC: Recent Labs  Lab 12/08/18 1430 12/09/18 0235  WBC 7.1 8.1  NEUTROABS 6.2  --   HGB 13.8 9.8*  HCT 42.5 30.8*  MCV 96.6 95.1  PLT 122* 950*   Basic Metabolic Panel: Recent Labs  Lab 12/08/18 1430 12/09/18 0235  NA 142 143  K 4.3 3.7  CL 108 111  CO2 23 21*  GLUCOSE 160* 128*  BUN 15 10  CREATININE 1.01* 0.80  CALCIUM 9.4 8.1*    Liver Function Tests: Recent Labs  Lab 12/08/18 1430  AST 34  ALT 23  ALKPHOS 56  BILITOT 0.7  PROT 7.2  ALBUMIN 4.4   No results for input(s): LIPASE, AMYLASE in the last 168 hours. No results for input(s): AMMONIA in the last 168 hours. Coagulation Profile: No results for input(s): INR, PROTIME in the last 168 hours. Cardiac Enzymes: No results for input(s): CKTOTAL, CKMB, CKMBINDEX, TROPONINI in the last 168 hours. BNP (last 3 results) No results for input(s): PROBNP in the last 8760 hours. CBG: No results for input(s): GLUCAP in the last 168 hours. Studies: No  results found.   Time spent: 35 minutes  Author: Berle Mull, MD [facility] Pager: [phone] 12/09/2018 5:38 PM  Between 7PM-7AM, please contact night-coverage at [email or web address], password [facility]

## 2018-12-10 DIAGNOSIS — J181 Lobar pneumonia, unspecified organism: Secondary | ICD-10-CM | POA: Diagnosis not present

## 2018-12-10 MED ORDER — CEFDINIR 300 MG PO CAPS
300.0000 mg | ORAL_CAPSULE | Freq: Two times a day (BID) | ORAL | Status: DC
  Administered 2018-12-10 – 2018-12-11 (×3): 300 mg via ORAL
  Filled 2018-12-10 (×3): qty 1

## 2018-12-10 NOTE — Progress Notes (Signed)
PROGRESS NOTE    Kendra Simmons  XBD:[id number] DOB: June 30, 1951 DOA: 12/08/2018 PCP: Donald Prose, MD   Brief Narrative:   Pt. with PMH of anxiety, depression, severe GERD on PPI, dysphagia recent esophageal dilatation; admitted on 12/08/2018, presented with complaint of vomiting and diarrhea as well as cough, was found to have aspiration pneumonitis. Currently further plan is continue IV antibiotics.  Feels awful. no further nausea or vomiting.  Reports that she has some reflux sensation every time she tries to eat.  No diarrhea reported.  Assessment & Plan:   Active Problems:   Pneumonia   Assessment and Plan: 1.  Right upper lobe pneumonia Aspiration pneumonia. sialoadenitis  Sepsis ruled out.  Patient recently saw PCP for sialoadenitis, was prescribed Augmentin and then started having nausea vomiting and diarrhea. Comes to the hospital with fever and cough. No diarrhea reported in last 48 hours. Chest x-ray shows evidence of right upper lobe consolidation. Influenza PCR is negative Blood cultures so far negative Started on IV clindamycin.  I will change to IV ceftriaxone and Flagyl to cover for both infection.  2.  GERD Esophageal dysmotility Continue PPI. Add Carafate.  3.  Anxiety and depression. Continue home medication.  4. Anemia Unclear etiology. Patient refuses any bleeding. Likely her admission hemoglobin of 13.8 was hemoconcentration. Her baseline is around 11. We will recheck labs tomorrow along with anemia panel.  5. Body mass index is 36.29 kg/m.  Dietary consult.   DVT prophylaxis: sq heparin  Disposition Plan: does not feel well enough for dc today   Subjective: Feels awful.  States she cannot go home  Objective: Vitals:   12/09/18 1600 12/09/18 1941 12/09/18 2300 12/10/18 0747  BP: 113/64 109/60 110/60 (!) 96/50  Pulse: 90 85 90 74  Resp: 16 12 14 14   Temp: 98.5 F (36.9 C) 98.9 F (37.2 C) 98 F (36.7 C) (!) 97.2  F (36.2 C)  TempSrc: Oral Oral Oral Oral  SpO2: 97% 98% 98% 95%  Weight:      Height:        Intake/Output Summary (Last 24 hours) at 12/10/2018 1028 Last data filed at 12/09/2018 1500 Gross per 24 hour  Intake 1717.78 ml  Output -  Net 1717.78 ml   Filed Weights   12/08/18 1526  Weight: 90 kg    Examination:  General exam: Appears calm and comfortable  Respiratory system: Clear to auscultation. Respiratory effort normal. Cardiovascular system: S1 & S2 heard, RRR. No JVD, murmurs, rubs, gallops or clicks. No pedal edema. Gastrointestinal system: Abdomen is nondistended, soft and nontender. No organomegaly or masses felt. Normal bowel sounds heard. Central nervous system: Alert and oriented. No focal neurological deficits. Extremities: Symmetric 5 x 5 power. Skin: No rashes, lesions or ulcers Psychiatry: Judgement and insight appear normal. Mood & affect appropriate.     Data Reviewed: I have personally reviewed following labs and imaging studies  CBC: Recent Labs  Lab 12/08/18 1430 12/09/18 0235  WBC 7.1 8.1  NEUTROABS 6.2  --   HGB 13.8 9.8*  HCT 42.5 30.8*  MCV 96.6 95.1  PLT 122* 834*   Basic Metabolic Panel: Recent Labs  Lab 12/08/18 1430 12/09/18 0235  NA 142 143  K 4.3 3.7  CL 108 111  CO2 23 21*  GLUCOSE 160* 128*  BUN 15 10  CREATININE 1.01* 0.80  CALCIUM 9.4 8.1*   GFR: Estimated Creatinine Clearance: 71.2 mL/min (by C-G formula based on SCr of 0.8 mg/dL). Liver Function Tests:  Recent Labs  Lab 12/08/18 1430  AST 34  ALT 23  ALKPHOS 56  BILITOT 0.7  PROT 7.2  ALBUMIN 4.4   No results for input(s): LIPASE, AMYLASE in the last 168 hours. No results for input(s): AMMONIA in the last 168 hours. Coagulation Profile: No results for input(s): INR, PROTIME in the last 168 hours. Cardiac Enzymes: No results for input(s): CKTOTAL, CKMB, CKMBINDEX, TROPONINI in the last 168 hours. BNP (last 3 results) No results for input(s): PROBNP in  the last 8760 hours. HbA1C: No results for input(s): HGBA1C in the last 72 hours. CBG: No results for input(s): GLUCAP in the last 168 hours. Lipid Profile: No results for input(s): CHOL, HDL, LDLCALC, TRIG, CHOLHDL, LDLDIRECT in the last 72 hours. Thyroid Function Tests: No results for input(s): TSH, T4TOTAL, FREET4, T3FREE, THYROIDAB in the last 72 hours. Anemia Panel: No results for input(s): VITAMINB12, FOLATE, FERRITIN, TIBC, IRON, RETICCTPCT in the last 72 hours. Sepsis Labs: Recent Labs  Lab 12/08/18 1452 12/08/18 1844  LATICACIDVEN 2.06* 2.28*    Recent Results (from the past 240 hour(s))  Blood Culture (routine x 2)     Status: None (Preliminary result)   Collection Time: 12/08/18  6:00 PM  Result Value Ref Range Status   Specimen Description BLOOD RIGHT ANTECUBITAL  Final   Special Requests   Final    BOTTLES DRAWN AEROBIC AND ANAEROBIC Blood Culture adequate volume Performed at [facility], [place] [address], [place], [place] [address]    Culture NO GROWTH 2 DAYS  Final   Report Status PENDING  Incomplete  Blood Culture (routine x 2)     Status: None (Preliminary result)   Collection Time: 12/08/18  6:17 PM  Result Value Ref Range Status   Specimen Description BLOOD RIGHT FOREARM  Final   Special Requests   Final    BOTTLES DRAWN AEROBIC AND ANAEROBIC Blood Culture adequate volume Performed at [facility], [place] [address] [place],  [address]    Culture NO GROWTH 2 DAYS  Final   Report Status PENDING  Incomplete         Radiology Studies: Dg Chest 2 View  Result Date: 12/08/2018 CLINICAL DATA:  Cough and congestion. EXAM: CHEST - 2 VIEW COMPARISON:  11/24/2014 FINDINGS: The heart size and mediastinal contours are within normal limits. Subtle airspace opacity in the right upper lobe which appears partially nodular in appearance. Findings may be consistent with acute pneumonia. Follow-up recommended to exclude potential underlying pulmonary  nodule(s). There is no evidence of pulmonary edema, pneumothorax or pleural fluid. The visualized skeletal structures are unremarkable. IMPRESSION: Airspace opacity in the right upper lobe which appears somewhat nodular in appearance. Findings may be consistent with acute pneumonia. Follow-up recommended to exclude potential underlying pulmonary nodule(s). Followup PA and lateral chest X-ray is recommended in 3-4 weeks following trial of antibiotic therapy to ensure resolution and exclude underlying malignancy. Electronically Signed   By: Aletta Edouard M.D.   On: 12/08/2018 15:47        Scheduled Meds: . acidophilus  1 capsule Oral Daily  . buPROPion  150 mg Oral Daily  . calcium carbonate  1,250 mg Oral Q breakfast  . cefdinir  300 mg Oral Q12H  . doxepin  75 mg Oral QHS  . enoxaparin (LOVENOX) injection  40 mg Subcutaneous Q24H  . linaclotide  72 mcg Oral QODAY  . memantine  10 mg Oral BID  . metroNIDAZOLE  500 mg Oral Q8H  .  pantoprazole  40 mg Oral Daily  . pravastatin  20 mg Oral QHS  . pregabalin  75 mg Oral QHS  . QUEtiapine  400 mg Oral QHS  . ramelteon  8 mg Oral QHS  . sucralfate  1 g Oral TID WC & HS  . tamsulosin  0.4 mg Oral QPC supper   Continuous Infusions: . sodium chloride 75 mL/hr at 12/10/18 0836     LOS: 0 days    Time spent: Crum, MD FACP [facility] Pager [phone]  If 7PM-7AM, please contact night-coverage [email or web address] Password [facility] 12/10/2018, 10:28 AM

## 2018-12-11 ENCOUNTER — Other Ambulatory Visit: Payer: Self-pay | Admitting: Family Medicine

## 2018-12-11 DIAGNOSIS — K219 Gastro-esophageal reflux disease without esophagitis: Secondary | ICD-10-CM | POA: Diagnosis not present

## 2018-12-11 DIAGNOSIS — R1110 Vomiting, unspecified: Secondary | ICD-10-CM | POA: Diagnosis not present

## 2018-12-11 DIAGNOSIS — R1310 Dysphagia, unspecified: Secondary | ICD-10-CM

## 2018-12-11 DIAGNOSIS — E2839 Other primary ovarian failure: Secondary | ICD-10-CM

## 2018-12-11 DIAGNOSIS — A419 Sepsis, unspecified organism: Secondary | ICD-10-CM

## 2018-12-11 DIAGNOSIS — J181 Lobar pneumonia, unspecified organism: Secondary | ICD-10-CM | POA: Diagnosis not present

## 2018-12-11 DIAGNOSIS — R1319 Other dysphagia: Secondary | ICD-10-CM

## 2018-12-11 MED ORDER — LINACLOTIDE 72 MCG PO CAPS: 72.0000 ug | ORAL_CAPSULE | ORAL | 0 refills | Status: DC

## 2018-12-11 MED ORDER — SUCRALFATE 1 GM/10ML PO SUSP: 1.0000 g | Freq: Three times a day (TID) | ORAL | 0 refills | Status: DC

## 2018-12-11 MED ORDER — OXYBUTYNIN CHLORIDE 5 MG PO TABS: 2.5000 mg | ORAL_TABLET | Freq: Three times a day (TID) | ORAL | 0 refills | Status: AC | PRN

## 2018-12-11 MED ORDER — METRONIDAZOLE 500 MG PO TABS: 500.0000 mg | ORAL_TABLET | Freq: Three times a day (TID) | ORAL | 0 refills | Status: AC

## 2018-12-11 MED ORDER — CEFDINIR 300 MG PO CAPS: 300.0000 mg | ORAL_CAPSULE | Freq: Two times a day (BID) | ORAL | 0 refills | Status: AC

## 2018-12-11 NOTE — Care Management Note (Signed)
Case Management Note  Patient Details  Name: Bryahna Lesko MRN: [id number] Date of Birth: 1951-12-12  Subjective/Objective:                    Action/Plan: Pt discharging home with self care. Pt has PcP, insurance and transportation home.   Expected Discharge Date:  12/11/18               Expected Discharge Plan:  Home/Self Care  In-House Referral:     Discharge planning Services     Post Acute Care Choice:    Choice offered to:     DME Arranged:    DME Agency:     HH Arranged:    HH Agency:     Status of Service:  Completed, signed off  If discussed at [employer] of Stay Meetings, dates discussed:    Additional Comments:  Pollie Friar, RN 12/11/2018, 1:09 PM

## 2018-12-11 NOTE — Progress Notes (Signed)
Pt seen by MD, orders written for discharge. RN went over discharge instructions with pt and answered all questions. RN removed IV. Escorted for discharge via wheelchair with all belongings. Pt will follow up outpatient with MD.

## 2018-12-11 NOTE — Discharge Summary (Signed)
Physician Discharge Summary  Kendra Simmons MVH:[id number] DOB: 1951/08/03 DOA: 12/08/2018  PCP: Donald Prose, MD  Admit date: 12/08/2018 Discharge date: 12/11/2018  Time spent: 40 minutes  Recommendations for Outpatient Follow-up:  1. Follow up with Dr [name] 1-2 weeks  2. Follow up with PCP 3-4 weeks for chest xray to evaluate resolution of pneumonia and monitor Hg   Discharge Diagnoses:  Principal Problem:   Pneumonia Active Problems:   GERD   Discharge Condition: stable  Diet recommendation: heart healthy  Filed Weights   12/08/18 1526  Weight: 90 kg    History of present illness:  Pt. with PMH of anxiety, depression, severe GERD on PPI, dysphagia recent esophageal dilatation; admitted on 12/08/2018, presented with complaint of vomiting and diarrhea as well as cough, was found to have aspiration pneumonitis.  Hospital Course:  1.Right upper lobe pneumonia Aspiration pneumonia. sialoadenitis Sepsis ruled out.  Patient recently saw PCP forsialoadenitis, was prescribed Augmentin and then started having nausea vomiting and diarrhea. Comes to the hospital with fever and cough. No diarrhea reported in last 48 hours. Chest x-ray shows evidence of right upper lobe consolidation. Influenza PCR is negative Blood cultures so far negative Started on IV clindamycin. changed to IV ceftriaxone and Flagyl to cover for both infection. Will discharge with  Omnicef and flagyl to complete 5 day course  Follow up with PCP 3-4 weeks for chest xray  2.GERD Esophageal dysmotility s/p dilation 10/19. Has discussed botox therapy with dr [name] Follow up with Dr [name] 1-2 weeks Continue PPI.  Carafate.  3.Anxiety and depression. Stable at baseline Continue home medication.  4. Anemia Unclear etiology. Patient denies bleeding. Likely her admission hemoglobin of 13.8 was hemoconcentration. Her baseline is around 11.  No s/sx bleeding Follow up with PCP 3-4  weeks  5.Body mass index is 36.29 kg/m.   Procedures:    Consultations:    Discharge Exam: Vitals:   12/10/18 2309 12/11/18 0756  BP: 122/63 (!) 111/49  Pulse: 97 81  Resp: 10 17  Temp: 98.7 F (37.1 C) 98 F (36.7 C)  SpO2: 97% 97%    General: obese well nourished in no acute distress Cardiovascular: rrr no mgr no LE edema Respiratory: normal effort BS with good air flow faint crackles on right clear on left no wheeze or rhonchi  Discharge Instructions   Discharge Instructions    Call MD for:  persistant nausea and vomiting   Complete by:  As directed    Call MD for:  severe uncontrolled pain   Complete by:  As directed    Diet - low sodium heart healthy   Complete by:  As directed    Discharge instructions   Complete by:  As directed    Advance diet slowly, eat small frequent meals Follow up with [facility] 1-2 weeks  Take medications as directed   Increase activity slowly   Complete by:  As directed      Allergies as of 12/11/2018      Reactions   Phentermine Anxiety   Augmentin [amoxicillin-pot Clavulanate] Diarrhea      Medication List    STOP taking these medications   HYDROcodone-acetaminophen 5-325 MG tablet Commonly known as:  NORCO/VICODIN   meloxicam 7.5 MG tablet Commonly known as:  MOBIC     TAKE these medications   buPROPion 150 MG 24 hr tablet Commonly known as:  WELLBUTRIN XL TAKE 1 TABLET BY MOUTH EVERY DAY IN THE MORNING   CALCIUM 600 PO Take 1  tablet by mouth daily.   cefdinir 300 MG capsule Commonly known as:  OMNICEF Take 1 capsule (300 mg total) by mouth every 12 (twelve) hours for 3 days.   clonazePAM 0.5 MG tablet Commonly known as:  KLONOPIN TAKE 1 TABLET BY MOUTH EVERY DAY AS NEEDED FOR ANXIETY (11/05) What changed:  See the new instructions.   diclofenac sodium 1 % Gel Commonly known as:  VOLTAREN Apply 2 g topically 4 (four) times daily.   doxepin 75 MG capsule Commonly known as:  SINEQUAN Take 1  capsule (75 mg total) by mouth at bedtime.   ibuprofen 200 MG tablet Commonly known as:  ADVIL,MOTRIN Take 400 mg by mouth 2 (two) times daily.   linaclotide 72 MCG capsule Commonly known as:  LINZESS Take 1 capsule (72 mcg total) by mouth every other day. Start taking on:  December 12, 2018 What changed:  how much to take   memantine 10 MG tablet Commonly known as:  NAMENDA Take 1 tablet (10 mg total) by mouth 2 (two) times daily.   metroNIDAZOLE 500 MG tablet Commonly known as:  FLAGYL Take 1 tablet (500 mg total) by mouth every 8 (eight) hours for 5 days.   MULTIVITAMIN PO Take 1 tablet by mouth daily.   omeprazole 40 MG capsule Commonly known as:  PRILOSEC Take 40 mg by mouth daily.   oxybutynin 5 MG tablet Commonly known as:  DITROPAN Take 0.5 tablets (2.5 mg total) by mouth every 8 (eight) hours as needed for bladder spasms.   pravastatin 20 MG tablet Commonly known as:  PRAVACHOL Take 1 tablet (20 mg total) by mouth at bedtime. For high cholesterol   pregabalin 75 MG capsule Commonly known as:  LYRICA Take 1 capsule (75 mg total) by mouth at bedtime.   PROBIOTIC PO Take 1 tablet by mouth daily.   QUEtiapine 400 MG tablet Commonly known as:  SEROQUEL Take 1 tablet (400 mg total) by mouth at bedtime.   ramelteon 8 MG tablet Commonly known as:  ROZEREM Take 1 tablet (8 mg total) by mouth at bedtime.   sucralfate 1 GM/10ML suspension Commonly known as:  CARAFATE Take 10 mLs (1 g total) by mouth 4 (four) times daily -  with meals and at bedtime.   tamsulosin 0.4 MG Caps capsule Commonly known as:  FLOMAX Take 1 capsule (0.4 mg total) by mouth daily after supper. For frequent urination/urgency   tiZANidine 4 MG capsule Commonly known as:  ZANAFLEX Take 1 capsule (4 mg total) by mouth 2 (two) times daily as needed for muscle spasms.   traMADol 50 MG tablet Commonly known as:  ULTRAM Take 1 tablet (50 mg total) by mouth every 6 (six) hours as needed.    Walker [employer] Wheels Misc Use walker daily to remain non-weight-bearing      Allergies  Allergen Reactions  . Phentermine Anxiety  . Augmentin [Amoxicillin-Pot Clavulanate] Diarrhea      The results of significant diagnostics from this hospitalization (including imaging, microbiology, ancillary and laboratory) are listed below for reference.    Significant Diagnostic Studies: Dg Chest 2 View  Result Date: 12/08/2018 CLINICAL DATA:  Cough and congestion. EXAM: CHEST - 2 VIEW COMPARISON:  11/24/2014 FINDINGS: The heart size and mediastinal contours are within normal limits. Subtle airspace opacity in the right upper lobe which appears partially nodular in appearance. Findings may be consistent with acute pneumonia. Follow-up recommended to exclude potential underlying pulmonary nodule(s). There is no evidence of pulmonary edema, pneumothorax  or pleural fluid. The visualized skeletal structures are unremarkable. IMPRESSION: Airspace opacity in the right upper lobe which appears somewhat nodular in appearance. Findings may be consistent with acute pneumonia. Follow-up recommended to exclude potential underlying pulmonary nodule(s). Followup PA and lateral chest X-ray is recommended in 3-4 weeks following trial of antibiotic therapy to ensure resolution and exclude underlying malignancy. Electronically Signed   By: Aletta Edouard M.D.   On: 12/08/2018 15:47   Mm 3d Screen Breast Bilateral  Result Date: 12/05/2018 CLINICAL DATA:  Screening. EXAM: DIGITAL SCREENING BILATERAL MAMMOGRAM WITH TOMO AND CAD COMPARISON:  Previous exam(s). ACR Breast Density Category b: There are scattered areas of fibroglandular density. FINDINGS: There are no findings suspicious for malignancy. Images were processed with CAD. IMPRESSION: No mammographic evidence of malignancy. A result letter of this screening mammogram will be mailed directly to the patient. RECOMMENDATION: Screening mammogram in one year.  (Code:SM-B-01Y) BI-RADS CATEGORY  1: Negative. Electronically Signed   By: Lillia Mountain M.D.   On: 12/05/2018 09:43    Microbiology: Recent Results (from the past 240 hour(s))  Blood Culture (routine x 2)     Status: None (Preliminary result)   Collection Time: 12/08/18  6:00 PM  Result Value Ref Range Status   Specimen Description BLOOD RIGHT ANTECUBITAL  Final   Special Requests   Final    BOTTLES DRAWN AEROBIC AND ANAEROBIC Blood Culture adequate volume   Culture   Final    NO GROWTH 3 DAYS Performed at [facility], [address] [place], [place] [address]    Report Status PENDING  Incomplete  Blood Culture (routine x 2)     Status: None (Preliminary result)   Collection Time: 12/08/18  6:17 PM  Result Value Ref Range Status   Specimen Description BLOOD RIGHT FOREARM  Final   Special Requests   Final    BOTTLES DRAWN AEROBIC AND ANAEROBIC Blood Culture adequate volume   Culture   Final    NO GROWTH 3 DAYS Performed at [facility], [place] [address], [place], [place] [address]    Report Status PENDING  Incomplete     Labs: Basic Metabolic Panel: Recent Labs  Lab 12/08/18 1430 12/09/18 0235  NA 142 143  K 4.3 3.7  CL 108 111  CO2 23 21*  GLUCOSE 160* 128*  BUN 15 10  CREATININE 1.01* 0.80  CALCIUM 9.4 8.1*   Liver Function Tests: Recent Labs  Lab 12/08/18 1430  AST 34  ALT 23  ALKPHOS 56  BILITOT 0.7  PROT 7.2  ALBUMIN 4.4   No results for input(s): LIPASE, AMYLASE in the last 168 hours. No results for input(s): AMMONIA in the last 168 hours. CBC: Recent Labs  Lab 12/08/18 1430 12/09/18 0235  WBC 7.1 8.1  NEUTROABS 6.2  --   HGB 13.8 9.8*  HCT 42.5 30.8*  MCV 96.6 95.1  PLT 122* 107*   Cardiac Enzymes: No results for input(s): CKTOTAL, CKMB, CKMBINDEX, TROPONINI in the last 168 hours. BNP: BNP (last 3 results) No results for input(s): BNP in the last 8760 hours.  ProBNP (last 3 results) No results for input(s): PROBNP in the  last 8760 hours.  CBG: No results for input(s): GLUCAP in the last 168 hours.     SignedRadene Gunning NP [facility] 12/11/2018, 1:00 PM

## 2018-12-11 NOTE — Progress Notes (Signed)
Pt was ambulated on room air. During that time, pt did not present with dyspnea. She was mildly fatigued. Pt's oxygen was 95% at beginning of walk around unit and 92% once returning to her room.

## 2018-12-13 LAB — CULTURE, BLOOD (ROUTINE X 2)
Culture: NO GROWTH
Culture: NO GROWTH
Special Requests: ADEQUATE
Special Requests: ADEQUATE

## 2018-12-23 ENCOUNTER — Ambulatory Visit: Payer: Medicare Other | Admitting: Psychology

## 2018-12-27 ENCOUNTER — Other Ambulatory Visit (INDEPENDENT_AMBULATORY_CARE_PROVIDER_SITE_OTHER): Payer: Self-pay | Admitting: Orthopaedic Surgery

## 2018-12-29 NOTE — Telephone Encounter (Signed)
Please advise 

## 2019-01-06 ENCOUNTER — Ambulatory Visit: Payer: Medicare Other | Admitting: Psychology

## 2019-01-06 ENCOUNTER — Other Ambulatory Visit (HOSPITAL_COMMUNITY): Payer: Self-pay | Admitting: Psychiatry

## 2019-01-06 DIAGNOSIS — F330 Major depressive disorder, recurrent, mild: Secondary | ICD-10-CM

## 2019-01-14 NOTE — Telephone Encounter (Signed)
Pt has called in to see if she can get a refill on her (doxepin SINEQUAN) 75 MG capsule) sent to [address]

## 2019-01-15 ENCOUNTER — Other Ambulatory Visit (HOSPITAL_COMMUNITY): Payer: Self-pay

## 2019-01-15 DIAGNOSIS — F330 Major depressive disorder, recurrent, mild: Secondary | ICD-10-CM

## 2019-01-15 MED ORDER — PREGABALIN 75 MG PO CAPS: 75.0000 mg | ORAL_CAPSULE | Freq: Every day | ORAL | 0 refills | Status: DC

## 2019-01-15 NOTE — Progress Notes (Unsigned)
Patient called in for a Lyrica refill, she has an appointment on Monday, but nit enough medication to get through the weekend. Per protocol, a 30 day supply was called into her pharmacy.

## 2019-01-19 ENCOUNTER — Encounter (HOSPITAL_COMMUNITY): Payer: Self-pay | Admitting: Psychiatry

## 2019-01-19 ENCOUNTER — Telehealth (INDEPENDENT_AMBULATORY_CARE_PROVIDER_SITE_OTHER): Payer: Self-pay | Admitting: Orthopaedic Surgery

## 2019-01-19 ENCOUNTER — Ambulatory Visit (INDEPENDENT_AMBULATORY_CARE_PROVIDER_SITE_OTHER): Payer: Medicare Other | Admitting: Psychiatry

## 2019-01-19 DIAGNOSIS — F5101 Primary insomnia: Secondary | ICD-10-CM | POA: Diagnosis not present

## 2019-01-19 DIAGNOSIS — F330 Major depressive disorder, recurrent, mild: Secondary | ICD-10-CM | POA: Diagnosis not present

## 2019-01-19 DIAGNOSIS — F411 Generalized anxiety disorder: Secondary | ICD-10-CM | POA: Diagnosis not present

## 2019-01-19 MED ORDER — MELOXICAM 15 MG PO TABS: 15.0000 mg | ORAL_TABLET | Freq: Every day | ORAL | 6 refills | Status: DC | PRN

## 2019-01-19 MED ORDER — CLONAZEPAM 0.5 MG PO TABS: 0.5000 mg | ORAL_TABLET | Freq: Every day | ORAL | 2 refills | Status: DC | PRN

## 2019-01-19 MED ORDER — QUETIAPINE FUMARATE 400 MG PO TABS: 400.0000 mg | ORAL_TABLET | Freq: Every day | ORAL | 0 refills | Status: DC

## 2019-01-19 MED ORDER — RAMELTEON 8 MG PO TABS: 8.0000 mg | ORAL_TABLET | Freq: Every day | ORAL | 2 refills | Status: DC

## 2019-01-19 MED ORDER — DOXEPIN HCL 75 MG PO CAPS: 75.0000 mg | ORAL_CAPSULE | Freq: Every day | ORAL | 0 refills | Status: DC

## 2019-01-19 MED ORDER — BUPROPION HCL ER (XL) 150 MG PO TB24: ORAL_TABLET | ORAL | 0 refills | Status: DC

## 2019-01-19 MED ORDER — PREGABALIN 75 MG PO CAPS: 75.0000 mg | ORAL_CAPSULE | Freq: Every day | ORAL | 0 refills | Status: DC

## 2019-01-19 NOTE — Telephone Encounter (Signed)
Patient called requesting an RX refill on her Meloxicam.  She uses [facility] on [address]  CB#[phone] ro [phone].  Thank you.

## 2019-01-19 NOTE — Telephone Encounter (Signed)
I don't see this on her medication list,is this ok to call in?

## 2019-01-19 NOTE — Telephone Encounter (Signed)
I'll send some in. 

## 2019-01-19 NOTE — Progress Notes (Signed)
BH MD/PA/NP OP Progress Note  01/19/2019 10:52 AM Kendra Simmons  MRN:  [id number]  Chief Complaint: I am doing fine.  I had a pneumonia around Christmas time but I am now feeling better.  HPI: Patient came for her follow-up appointment.  She was sick around Christmas time because of pneumonia.  She was admitted but now she is slowly and gradually getting better.  She feel her depression and anxiety is much better since she is taking the medication but she still ruminates about her past especially about her family members.  She is still not happy with the daughter who does not talk to her but only calls when she needs the money.  She is happy that son is at least helping her but is still she hope that more communication with the son.  Patient had a dispute with her sister about the land and now she has a survey done and hoping to have a deed in her drain.  She is sleeping good.  She denies any crying spells.  She feels more active and less depressed with the help of medication.  We talked about polypharmacy but patient is very reluctant to change the medication or reduce the dose since it is working very well.  She was seeing therapist Marlowe Sax but since January her insurance changed and she is no longer covered to see a therapist.  She denies any irritability, anger, mania or any psychosis.  She admitted financial issues and hoping to get part-time job in the future.  Her energy level is good.  She has no tremors, shakes, EPS.  She has no fall and denies any drinking or using any illegal substances.  Visit Diagnosis:    ICD-10-CM   1. Major depressive disorder, recurrent episode, mild (HCC) F33.0 ramelteon (ROZEREM) 8 MG tablet    QUEtiapine (SEROQUEL) 400 MG tablet    pregabalin (LYRICA) 75 MG capsule    doxepin (SINEQUAN) 75 MG capsule    clonazePAM (KLONOPIN) 0.5 MG tablet    buPROPion (WELLBUTRIN XL) 150 MG 24 hr tablet  2. Primary insomnia F51.01 ramelteon (ROZEREM) 8 MG tablet  3. GAD  (generalized anxiety disorder) F41.1 buPROPion (WELLBUTRIN XL) 150 MG 24 hr tablet    Past Psychiatric History: Reviewed. H/O multiple hospitalization. Last hospitalization in June 2016. Multiple IOP.  H/O hallucination, paranoia and depression. In the past seen [name], [name] and Dr. Thurmond Butts. Tried Xanax, amitriptyline, imipramine, temazepam, trazodone, Zyprexa, Luvox, Wellbutrin, Prozac, Vistaril, Remeronand Bellsomra.  Past Medical History:  Past Medical History:  Diagnosis Date  . Anxiety   . Arthritis   . Depression   . Emotional depression 02/04/2015  . GERD (gastroesophageal reflux disease)   . High cholesterol   . Insomnia   . Memory loss     Past Surgical History:  Procedure Laterality Date  . CESAREAN SECTION    . COSMETIC SURGERY    . ESOPHAGEAL MANOMETRY N/A 09/26/2016   Procedure: ESOPHAGEAL MANOMETRY (EM);  Surgeon: Clarene Essex, MD;  Location: [facility];  Service: Endoscopy;  Laterality: N/A;  . laproscopy      Family Psychiatric History: Viewed.  Family History:  Family History  Problem Relation Age of Onset  . Sleep apnea Father   . Alcohol abuse Father   . Diabetes Mother   . Diabetes Sister   . Diabetes Maternal Uncle   . Diabetes Cousin     Social History:  Social History   Socioeconomic History  . Marital status: Divorced  Spouse name: Not on file  . Number of children: 3  . Years of education: 108  . Highest education level: Not on file  Occupational History  . Occupation: Retired  Scientific laboratory technician  . Financial resource strain: Not on file  . Food insecurity:    Worry: Not on file    Inability: Not on file  . Transportation needs:    Medical: Not on file    Non-medical: Not on file  Tobacco Use  . Smoking status: Never Smoker  . Smokeless tobacco: Never Used  Substance and Sexual Activity  . Alcohol use: No    Alcohol/week: 0.0 standard drinks    Comment: zero  . Drug use: No    Types: Barbituates, Benzodiazepines     Comment: Denies any drug use other than benzos  . Sexual activity: Never    Birth control/protection: Abstinence  Lifestyle  . Physical activity:    Days per week: Not on file    Minutes per session: Not on file  . Stress: Not on file  Relationships  . Social connections:    Talks on phone: Not on file    Gets together: Not on file    Attends religious service: Not on file    Active member of club or organization: Not on file    Attends meetings of clubs or organizations: Not on file    Relationship status: Not on file  Other Topics Concern  . Not on file  Social History Narrative   Patient lives at home alone.   Caffeine Use:  2 cokes daily   Sister lives next door.    Allergies:  Allergies  Allergen Reactions  . Phentermine Anxiety  . Augmentin [Amoxicillin-Pot Clavulanate] Diarrhea    Metabolic Disorder Labs: Lab Results  Component Value Date   HGBA1C 5.8 (H) 11/04/2014   MPG 120 (H) 11/04/2014   MPG 134 (H) 07/13/2014   Lab Results  Component Value Date   PROLACTIN 50.3 (H) 05/25/2015   Lab Results  Component Value Date   CHOL 177 11/04/2014   TRIG 79 11/04/2014   HDL 45 11/04/2014   CHOLHDL 3.9 11/04/2014   VLDL 16 11/04/2014   LDLCALC 116 (H) 11/04/2014   LDLCALC 71 07/20/2014   Lab Results  Component Value Date   TSH 1.523 07/04/2015   TSH 2.704 05/29/2015    Therapeutic Level Labs: No results found for: LITHIUM No results found for: VALPROATE No components found for:  CBMZ  Current Medications: Current Outpatient Medications  Medication Sig Dispense Refill  . buPROPion (WELLBUTRIN XL) 150 MG 24 hr tablet TAKE 1 TABLET BY MOUTH EVERY DAY IN THE MORNING 90 tablet 0  . Calcium Carbonate (CALCIUM 600 PO) Take 1 tablet by mouth daily.    . clonazePAM (KLONOPIN) 0.5 MG tablet TAKE 1 TABLET BY MOUTH EVERY DAY AS NEEDED FOR ANXIETY (11/05) (Patient taking differently: Take 0.5 mg by mouth daily as needed for anxiety. ) 30 tablet 1  . diclofenac  sodium (VOLTAREN) 1 % GEL Apply 2 g topically 4 (four) times daily. 100 g 3  . doxepin (SINEQUAN) 75 MG capsule Take 1 capsule (75 mg total) by mouth at bedtime. 90 capsule 0  . ibuprofen (ADVIL,MOTRIN) 200 MG tablet Take 400 mg by mouth 2 (two) times daily.    [name] linaclotide (LINZESS) 72 MCG capsule Take 1 capsule (72 mcg total) by mouth every other day. 30 capsule 0  . memantine (NAMENDA) 10 MG tablet Take 1 tablet (10  mg total) by mouth 2 (two) times daily. 60 tablet 12  . Misc. Devices ([place]) MISC Use walker daily to remain non-weight-bearing 1 each 0  . Multiple Vitamins-Minerals (MULTIVITAMIN PO) Take 1 tablet by mouth daily.    [name] omeprazole (PRILOSEC) 40 MG capsule Take 40 mg by mouth daily.   3  . oxybutynin (DITROPAN) 5 MG tablet Take 0.5 tablets (2.5 mg total) by mouth every 8 (eight) hours as needed for bladder spasms. 90 tablet 0  . pravastatin (PRAVACHOL) 20 MG tablet Take 1 tablet (20 mg total) by mouth at bedtime. For high cholesterol    . pregabalin (LYRICA) 75 MG capsule Take 1 capsule (75 mg total) by mouth at bedtime. 30 capsule 0  . Probiotic Product (PROBIOTIC PO) Take 1 tablet by mouth daily.    . QUEtiapine (SEROQUEL) 400 MG tablet Take 1 tablet (400 mg total) by mouth at bedtime. 90 tablet 0  . ramelteon (ROZEREM) 8 MG tablet Take 1 tablet (8 mg total) by mouth at bedtime. 30 tablet 2  . sucralfate (CARAFATE) 1 GM/10ML suspension Take 10 mLs (1 g total) by mouth 4 (four) times daily -  with meals and at bedtime. 420 mL 0  . tamsulosin (FLOMAX) 0.4 MG CAPS capsule Take 1 capsule (0.4 mg total) by mouth daily after supper. For frequent urination/urgency 15 capsule 0  . tiZANidine (ZANAFLEX) 4 MG capsule Take 1 capsule (4 mg total) by mouth 2 (two) times daily as needed for muscle spasms. 40 capsule 0  . tiZANidine (ZANAFLEX) 4 MG tablet TAKE 1 TABLET BY MOUTH AT BEDTIME 30 tablet 0  . traMADol (ULTRAM) 50 MG tablet Take 1 tablet (50 mg total) by mouth every 6  (six) hours as needed. 40 tablet 0   No current facility-administered medications for this visit.      Musculoskeletal: Strength & Muscle Tone: within normal limits Gait & Station: normal Patient leans: N/A  Psychiatric Specialty Exam: ROS  Blood pressure 128/76, height 5' 4.5" (1.638 m), weight 194 lb (88 kg).Body mass index is 32.79 kg/m.  General Appearance: Casual  Eye Contact:  Good  Speech:  Clear and Coherent  Volume:  Normal  Mood:  Euthymic  Affect:  Appropriate  Thought Process:  Goal Directed  Orientation:  Full (Time, Place, and Person)  Thought Content: Rumination   Suicidal Thoughts:  No  Homicidal Thoughts:  No  Memory:  Immediate;   Good Recent;   Good Remote;   Good  Judgement:  Good  Insight:  Good  Psychomotor Activity:  Normal  Concentration:  Concentration: Fair and Attention Span: Fair  Recall:  Good  Fund of Knowledge: Good  Language: Good  Akathisia:  No  Handed:  Right  AIMS (if indicated): not done  Assets:  Communication Skills Desire for Improvement Housing Resilience  ADL's:  Intact  Cognition: WNL  Sleep:  Good   Screenings: AIMS     ED to Hosp-Admission (Discharged) from 05/24/2015 in [facility]  AIMS Total Score  0    AUDIT     ED to Hosp-Admission (Discharged) from 05/24/2015 in [place] [facility] Admission (Discharged) from 12/13/2014 in [facility] Admission (Discharged) from 11/02/2014 in [facility] Admission (Discharged) from 07/11/2014 in [facility]  Alcohol Use Disorder Identification Test Final Score (AUDIT)  0  0  0  0    Mini-Mental  Office Visit from 11/16/2015 in [facility] Office Visit from 03/10/2015 in [facility]  Total Score (max 30 points )  24  21    PHQ2-9     Counselor from 09/12/2015 in [facility]  PHQ-2 Total Score  2  PHQ-9 Total Score  4       Assessment and Plan: Major depressive disorder, recurrent.  Generalized anxiety disorder.  Chronic insomnia.  Patient is doing better on her current medication.  She does not feel that she need a therapist as she is using her skills which she learned when she was in therapy with Dr. Marlowe Sax.  She understand that she need to move on and should not have new expectations from her family members.  Patient has no side effects from the medication.  She like to continue doxepin 75 mg at bedtime, Lyrica 75 mg at bedtime, Wellbutrin XL 150 mg daily, Rozerem 8 mg at bedtime, Klonopin 0.5 mg at bedtime for anxiety and Seroquel 400 mg at bedtime.  We discussed medication side effect specially postural hypotension, fall and dizziness but patient is tolerating all her medication without any side effects.  I recommended if she needed a therapy in the future then she should call us.  Safety concerns at any time having active suicidal thoughts or homicidal thought that she need to call 911 or go to local emergency room.  Follow-up in 3 months.   [name], MD 01/19/2019, 10:52 AM

## 2019-01-20 ENCOUNTER — Ambulatory Visit: Payer: Medicare Other | Admitting: Psychology

## 2019-01-22 ENCOUNTER — Other Ambulatory Visit (INDEPENDENT_AMBULATORY_CARE_PROVIDER_SITE_OTHER): Payer: Self-pay | Admitting: Orthopaedic Surgery

## 2019-01-22 NOTE — Telephone Encounter (Signed)
Please advise 

## 2019-01-30 ENCOUNTER — Other Ambulatory Visit: Payer: Self-pay | Admitting: Family Medicine

## 2019-01-30 ENCOUNTER — Ambulatory Visit
Admission: RE | Admit: 2019-01-30 | Discharge: 2019-01-30 | Disposition: A | Discharge status: Home / Self Care | Payer: Medicare Other | Source: Ambulatory Visit | Attending: Family Medicine | Admitting: Family Medicine

## 2019-01-30 DIAGNOSIS — J690 Pneumonitis due to inhalation of food and vomit: Secondary | ICD-10-CM

## 2019-02-03 ENCOUNTER — Ambulatory Visit: Payer: Medicare Other | Admitting: Psychology

## 2019-02-07 ENCOUNTER — Other Ambulatory Visit: Payer: Self-pay

## 2019-02-07 ENCOUNTER — Ambulatory Visit
Admission: EM | Admit: 2019-02-07 | Discharge: 2019-02-07 | Disposition: A | Discharge status: Home / Self Care | Payer: Medicare Other | Attending: Family Medicine | Admitting: Family Medicine

## 2019-02-07 DIAGNOSIS — H01004 Unspecified blepharitis left upper eyelid: Secondary | ICD-10-CM | POA: Diagnosis not present

## 2019-02-07 MED ORDER — GENTAMICIN SULFATE 0.3 % OP SOLN: 2.0000 [drp] | Freq: Three times a day (TID) | OPHTHALMIC | 0 refills | Status: AC

## 2019-02-07 MED ORDER — CEPHALEXIN 500 MG PO CAPS: 500.0000 mg | ORAL_CAPSULE | Freq: Three times a day (TID) | ORAL | 0 refills | Status: AC

## 2019-02-07 NOTE — ED Provider Notes (Signed)
[facility]    CSN: [id number] Arrival date & time: 02/07/19  1343     History   Chief Complaint Chief Complaint  Patient presents with  . Eye Pain    HPI Kendra Simmons is a 68 y.o. female.   HPI  Eye pain, left  Complains of acute eye pain in left eye upon awakening this morning. She characterizes pain as "rocks in her eyes". She experienced a similar occurrence about 6 months ago in which she suffered from Blepharitis which finally resolved after multiple courses of gentamycin. She complains of redness outer upper eye lid which is very irritated. Complains of clear watery eye drainage this morning which has decreased with warm compressions. Past Medical History:  Diagnosis Date  . Anxiety   . Arthritis   . Depression   . Emotional depression 02/04/2015  . GERD (gastroesophageal reflux disease)   . High cholesterol   . Insomnia   . Memory loss     Patient Active Problem List   Diagnosis Date Noted  . Sepsis without acute organ dysfunction ([place])   . Regurgitation of stomach contents   . Esophageal dysphagia   . Community acquired pneumonia of right upper lobe of lung ([place]) 12/08/2018  . Chronic bilateral low back pain 07/01/2017  . Dehydration 07/03/2015  . UTI (lower urinary tract infection) 07/03/2015  . Hypokalemia 07/03/2015  . AKI (acute kidney injury) ([place]) 07/03/2015  . Tachycardia 05/27/2015  . 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 Insomnia disorder, with non-sleep disorder mental comorbidity, 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  . 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 Snoring 12/24/2014  . Unintended weight loss 12/24/2014  . 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 Panic attacks 07/26/2014  . Thrombocytopenia ([place]) 12/17/2012  . DYSLIPIDEMIA 03/13/2010  . GERD 03/13/2010    Past Surgical History:  Procedure Laterality Date  . CESAREAN SECTION    . COSMETIC  SURGERY    . ESOPHAGEAL MANOMETRY N/A 09/26/2016   Procedure: ESOPHAGEAL MANOMETRY (EM);  Surgeon: Clarene Essex, MD;  Location: [facility];  Service: Endoscopy;  Laterality: N/A;  . laproscopy      OB History   No obstetric history on file.      Home Medications    Prior to Admission medications   Medication Sig Start Date End Date Taking? Authorizing Provider  buPROPion (WELLBUTRIN XL) 150 MG 24 hr tablet TAKE 1 TABLET BY MOUTH EVERY DAY IN THE MORNING 01/19/19   Arfeen, Arlyce Harman, MD  Calcium Carbonate (CALCIUM 600 PO) Take 1 tablet by mouth daily.    [provider]  cephALEXin (KEFLEX) 500 MG capsule Take 1 capsule (500 mg total) by mouth 3 (three) times daily for 7 days. 02/07/19 02/14/19  Scot Jun, FNP  clonazePAM (KLONOPIN) 0.5 MG tablet Take 1 tablet (0.5 mg total) by mouth daily as needed for anxiety. 01/19/19   Arfeen, Arlyce Harman, MD  diclofenac sodium (VOLTAREN) 1 % GEL Apply 2 g topically 4 (four) times daily. 06/04/18   Mcarthur Rossetti, MD  doxepin (SINEQUAN) 75 MG capsule Take 1 capsule (75 mg total) by mouth at bedtime. 01/19/19   Arfeen, Arlyce Harman, MD  gentamicin (GARAMYCIN) 0.3 % ophthalmic solution Place 2 drops into the left eye 3 (three) times daily for 10 days. 02/07/19 02/17/19  Scot Jun, FNP  ibuprofen (ADVIL,MOTRIN) 200 MG tablet Take 400 mg by mouth 2 (two) times daily.    [provider]  linaclotide (LINZESS) 72 MCG capsule Take 1 capsule (72 mcg total) by mouth every other day. 12/12/18   Black, Lezlie Octave, NP  meloxicam (MOBIC) 15 MG tablet Take 1 tablet (15 mg total) by mouth daily as needed for pain. 01/19/19   Mcarthur Rossetti, MD  memantine (NAMENDA) 10 MG tablet Take 1 tablet (10 mg total) by mouth 2 (two) times daily. 06/02/15   Rankin, Shuvon B, NP  Misc. Devices (WALKER GLIDE WHEELS) MISC Use walker daily to remain non-weight-bearing 05/09/18   [place]urst, [place], PA-C  Multiple Vitamins-Minerals (MULTIVITAMIN PO) Take 1 tablet by  mouth daily.    [provider]  omeprazole (PRILOSEC) 40 MG capsule Take 40 mg by mouth daily.  09/06/16   [provider]  oxybutynin (DITROPAN) 5 MG tablet Take 0.5 tablets (2.5 mg total) by mouth every 8 (eight) hours as needed for bladder spasms. 12/11/18 12/12/19  Radene Gunning, NP  pravastatin (PRAVACHOL) 20 MG tablet Take 1 tablet (20 mg total) by mouth at bedtime. For high cholesterol 06/02/15   Rankin, Shuvon B, NP  pregabalin (LYRICA) 75 MG capsule Take 1 capsule (75 mg total) by mouth at bedtime. 01/19/19 01/19/20  [name], MD  Probiotic Product (PROBIOTIC PO) Take 1 tablet by mouth daily.    [provider]  QUEtiapine (SEROQUEL) 400 MG tablet Take 1 tablet (400 mg total) by mouth at bedtime. 01/19/19   Arfeen, Arlyce Harman, MD  ramelteon (ROZEREM) 8 MG tablet Take 1 tablet (8 mg total) by mouth at bedtime. 01/19/19   Arfeen, Arlyce Harman, MD  sucralfate (CARAFATE) 1 GM/10ML suspension Take 10 mLs (1 g total) by mouth 4 (four) times daily -  with meals and at bedtime. 12/11/18   Black, Lezlie Octave, NP  tamsulosin (FLOMAX) 0.4 MG CAPS capsule Take 1 capsule (0.4 mg total) by mouth daily after supper. For frequent urination/urgency 06/02/15   Rankin, Shuvon B, NP  tiZANidine (ZANAFLEX) 4 MG capsule Take 1 capsule (4 mg total) by mouth 2 (two) times daily as needed for muscle spasms. 12/04/18   Mcarthur Rossetti, MD  tiZANidine (ZANAFLEX) 4 MG tablet TAKE 1 TABLET BY MOUTH EVERYDAY AT BEDTIME 01/22/19   Mcarthur Rossetti, MD    Family History Family History  Problem Relation Age of Onset  . Sleep apnea Father   . Alcohol abuse Father   . Diabetes Mother   . Diabetes Sister   . Diabetes Maternal Uncle   . Diabetes Cousin     Social History Social History   Tobacco Use  . Smoking status: Never Smoker  . Smokeless tobacco: Never Used  Substance Use Topics  . Alcohol use: No    Alcohol/week: 0.0 standard drinks    Comment: zero  . Drug use: No    Types:  Barbituates, Benzodiazepines    Comment: Denies any drug use other than benzos     Allergies   Phentermine and Augmentin [amoxicillin-pot clavulanate]   Review of Systems Review of Systems Pertinent negatives listed in HPI  Physical Exam Triage Vital Signs ED Triage Vitals  Enc Vitals Group     BP 02/07/19 1405 138/77     Pulse Rate 02/07/19 1405 88     Resp 02/07/19 1405 16     Temp 02/07/19 1405 97.7 F (36.5 C)     Temp Source 02/07/19 1405 Oral     SpO2 02/07/19 1405 95 %     Weight 02/07/19 1404 190 lb (86.2 kg)  Height 02/07/19 1404 5\' 4"  (1.626 m)     Head Circumference --      Peak Flow --      Pain Score 02/07/19 1404 8     Pain Loc --      Pain Edu? --      Excl. in [place]? --    No data found.  Updated Vital Signs BP 138/77 (BP Location: Left Arm)   Pulse 88   Temp 97.7 F (36.5 C) (Oral)   Resp 16   Ht 5\' 4"  (1.626 m)   Wt 190 lb (86.2 kg)   SpO2 95%   BMI 32.61 kg/m   Visual Acuity Right Eye Distance:   Left Eye Distance:   Bilateral Distance:    Right Eye Near:   Left Eye Near:    Bilateral Near:     Physical Exam Constitutional:      Appearance: Normal appearance.  Eyes:   Cardiovascular:     Rate and Rhythm: Normal rate and regular rhythm.  Pulmonary:     Effort: Pulmonary effort is normal.     Breath sounds: Normal breath sounds.  Neurological:     Mental Status: She is alert.  Psychiatric:        Attention and Perception: Attention normal.        Speech: Speech normal.     UC Treatments / Results  Labs (all labs ordered are listed, but only abnormal results are displayed) Labs Reviewed - No data to display  EKG None  Radiology No results found.  Procedures Procedures (including critical care time)  Medications Ordered in UC Medications - No data to display  Initial Impression / Assessment and Plan / UC Course  I have reviewed the triage vital signs and the nursing notes.  Pertinent labs & imaging results  that were available during my care of the patient were reviewed by me and considered in my medical decision making (see chart for details).   Fluorescein staining unavailable. Patient given the option to go elsewhere for treatment however opted to be treated here today.  Explained of limited ability to complete a comprehensive exam given unavailability of fluorescein staining.  She has no visual acuity changes has some erythema and redness of the upper left eyelid.  Will treat for blepharitis however given strict instructions to follow-up with an ophthalmologist if symptoms have not significantly improved within 2 days. Final Clinical Impressions(s) / UC Diagnoses   Final diagnoses:  Blepharitis of left upper eyelid, unspecified type   Discharge Instructions   None    ED Prescriptions    Medication Sig Dispense Auth. Provider   cephALEXin (KEFLEX) 500 MG capsule Take 1 capsule (500 mg total) by mouth 3 (three) times daily for 7 days. 20 capsule Scot Jun, FNP   gentamicin (GARAMYCIN) 0.3 % ophthalmic solution Place 2 drops into the left eye 3 (three) times daily for 10 days. 3 mL Scot Jun, FNP     Controlled Substance Prescriptions [place] Controlled Substance Registry consulted? Not Applicable   Scot Jun, FNP 02/10/19 2052

## 2019-02-07 NOTE — ED Triage Notes (Signed)
Per pt she woke upon this morning with irritation to her left eye. Has been using warm compresses and eye drops to try to clean it out. Says it feels like a rock is in her eye. Some redness to the eyes noted.

## 2019-02-15 ENCOUNTER — Other Ambulatory Visit (INDEPENDENT_AMBULATORY_CARE_PROVIDER_SITE_OTHER): Payer: Self-pay | Admitting: Orthopaedic Surgery

## 2019-02-16 NOTE — Telephone Encounter (Signed)
Please advise 

## 2019-02-18 ENCOUNTER — Telehealth (INDEPENDENT_AMBULATORY_CARE_PROVIDER_SITE_OTHER): Payer: Self-pay | Admitting: Physician Assistant

## 2019-02-18 MED ORDER — TRAMADOL HCL 50 MG PO TABS: 50.0000 mg | ORAL_TABLET | Freq: Four times a day (QID) | ORAL | 0 refills | Status: DC | PRN

## 2019-02-18 NOTE — Telephone Encounter (Signed)
Please advise 

## 2019-02-18 NOTE — Telephone Encounter (Signed)
I sent in some tramadol 

## 2019-02-18 NOTE — Telephone Encounter (Signed)
Patient called advised she hurt her back bending down unpacking from a trip she took. Patient asked if she can get something called in for her prior to her appointment tomorrow. Patient uses the [facility] on [address]. The number to contact patient is [phone] or [phone] (Cell)

## 2019-02-19 ENCOUNTER — Ambulatory Visit (INDEPENDENT_AMBULATORY_CARE_PROVIDER_SITE_OTHER): Payer: Self-pay | Admitting: Physician Assistant

## 2019-03-11 ENCOUNTER — Other Ambulatory Visit (INDEPENDENT_AMBULATORY_CARE_PROVIDER_SITE_OTHER): Payer: Self-pay | Admitting: Orthopaedic Surgery

## 2019-03-11 NOTE — Telephone Encounter (Signed)
Please advise 

## 2019-03-16 ENCOUNTER — Other Ambulatory Visit (INDEPENDENT_AMBULATORY_CARE_PROVIDER_SITE_OTHER): Payer: Self-pay | Admitting: Orthopaedic Surgery

## 2019-03-16 ENCOUNTER — Other Ambulatory Visit (HOSPITAL_COMMUNITY): Payer: Self-pay | Admitting: Psychiatry

## 2019-03-16 DIAGNOSIS — F5101 Primary insomnia: Secondary | ICD-10-CM

## 2019-03-16 DIAGNOSIS — F330 Major depressive disorder, recurrent, mild: Secondary | ICD-10-CM

## 2019-03-16 NOTE — Telephone Encounter (Signed)
Please advise 

## 2019-04-03 ENCOUNTER — Other Ambulatory Visit (INDEPENDENT_AMBULATORY_CARE_PROVIDER_SITE_OTHER): Payer: Self-pay | Admitting: Orthopaedic Surgery

## 2019-04-06 NOTE — Telephone Encounter (Signed)
Please advise 

## 2019-04-09 ENCOUNTER — Other Ambulatory Visit (HOSPITAL_COMMUNITY): Payer: Self-pay | Admitting: Psychiatry

## 2019-04-09 DIAGNOSIS — F5101 Primary insomnia: Secondary | ICD-10-CM

## 2019-04-09 DIAGNOSIS — F330 Major depressive disorder, recurrent, mild: Secondary | ICD-10-CM

## 2019-04-13 ENCOUNTER — Other Ambulatory Visit (HOSPITAL_COMMUNITY): Payer: Self-pay | Admitting: Psychiatry

## 2019-04-13 ENCOUNTER — Other Ambulatory Visit (INDEPENDENT_AMBULATORY_CARE_PROVIDER_SITE_OTHER): Payer: Self-pay | Admitting: Orthopaedic Surgery

## 2019-04-13 DIAGNOSIS — F330 Major depressive disorder, recurrent, mild: Secondary | ICD-10-CM

## 2019-04-13 NOTE — Telephone Encounter (Signed)
Please advise 

## 2019-04-15 ENCOUNTER — Other Ambulatory Visit (HOSPITAL_COMMUNITY): Payer: Self-pay

## 2019-04-15 DIAGNOSIS — F330 Major depressive disorder, recurrent, mild: Secondary | ICD-10-CM

## 2019-04-15 MED ORDER — CLONAZEPAM 0.5 MG PO TABS: 0.5000 mg | ORAL_TABLET | Freq: Every day | ORAL | 0 refills | Status: DC | PRN

## 2019-04-20 ENCOUNTER — Encounter (HOSPITAL_COMMUNITY): Payer: Self-pay | Admitting: Psychiatry

## 2019-04-20 ENCOUNTER — Ambulatory Visit (INDEPENDENT_AMBULATORY_CARE_PROVIDER_SITE_OTHER): Payer: Medicare Other | Admitting: Psychiatry

## 2019-04-20 ENCOUNTER — Other Ambulatory Visit: Payer: Self-pay

## 2019-04-20 DIAGNOSIS — F411 Generalized anxiety disorder: Secondary | ICD-10-CM

## 2019-04-20 DIAGNOSIS — F330 Major depressive disorder, recurrent, mild: Secondary | ICD-10-CM | POA: Diagnosis not present

## 2019-04-20 DIAGNOSIS — F5101 Primary insomnia: Secondary | ICD-10-CM | POA: Diagnosis not present

## 2019-04-20 MED ORDER — QUETIAPINE FUMARATE 400 MG PO TABS: 400.0000 mg | ORAL_TABLET | Freq: Every day | ORAL | 0 refills | Status: DC

## 2019-04-20 MED ORDER — CLONAZEPAM 0.5 MG PO TABS: 0.5000 mg | ORAL_TABLET | Freq: Every day | ORAL | 0 refills | Status: DC | PRN

## 2019-04-20 MED ORDER — RAMELTEON 8 MG PO TABS: 8.0000 mg | ORAL_TABLET | Freq: Every day | ORAL | 2 refills | Status: DC

## 2019-04-20 MED ORDER — PREGABALIN 75 MG PO CAPS: 75.0000 mg | ORAL_CAPSULE | Freq: Every day | ORAL | 0 refills | Status: DC

## 2019-04-20 MED ORDER — BUPROPION HCL ER (XL) 150 MG PO TB24: ORAL_TABLET | ORAL | 0 refills | Status: DC

## 2019-04-20 MED ORDER — DOXEPIN HCL 75 MG PO CAPS: 75.0000 mg | ORAL_CAPSULE | Freq: Every day | ORAL | 0 refills | Status: DC

## 2019-04-20 NOTE — Progress Notes (Signed)
Virtual Visit via Telephone Note  I connected with Kendra Simmons on 04/20/19 at 10:00 AM EDT by telephone and verified that I am speaking with the correct person using two identifiers.   I discussed the limitations, risks, security and privacy concerns of performing an evaluation and management service by telephone and the availability of in person appointments. I also discussed with the patient that there may be a patient responsible charge related to this service. The patient expressed understanding and agreed to proceed.   History of Present Illness: Patient was evaluated through phone session.  Patient told she is under a lot of stress lately because her daughter moved in with her dogs after she lost her job.  Patient already had issues with the daughter in the past but she cannot say no as she does not have any other place to live.  She also struggling with a legal issues with her sister and it is not going anywhere.  Lately the courts are closed and she is spending too much money on [employer].  Patient admitted poor sleep and sometimes ruminative thoughts but denies any suicidal thoughts or homicidal thoughts.  She also told that she is no longer seeing a therapist because her insurance does not cover her sessions.  She admitted weight gain and poor sleep because she is thinking all the time about her relationship with her daughter and legal issues with the sister.  Patient reported she was doing better until the new situation started.  She also worried about pandemic as she has multiple health issues.  Patient denies any irritability, anger, mania, psychosis.  She is taking multiple psychotropic medication and despite she still have residual symptoms of anxiety and depression.  She is hoping to get a part-time job in the future but due to current situation she realized it will be difficult.  She has a plan to call Lawyer discuss about legal challenges with her sister Kendra Simmons.  Patient is  taking all her medication and reported no tremors, shakes, rash or any itching.  She denies drinking or using any illegal substances.   Past Psychiatric History: Reviewed. H/O multiple hospitalization. Last hospitalization in June 2016. Multiple IOP.  H/O hallucination, paranoia and depression. In the past seen [name], [name] and Dr. Thurmond Butts. Tried Xanax, amitriptyline, imipramine, temazepam, trazodone, Zyprexa, Luvox, Wellbutrin, Prozac, Vistaril, Remeronand Bellsomra.   Observations/Objective: Mental status examination done on the phone.  Patient appears stressed out and describes her mood anxious.  Her speech is slow but clear and coherent.  Her thought process logical and goal-directed.  Her attention concentration is fair.  She admitted ruminative thoughts but denies any auditory or visual hallucination, active or passive suicidal thoughts or homicidal thought.  She has no flight of ideas or loose association.  There were no psychosis, grandiosity or paranoia.  She is alert and oriented x3.  Her cognition is fair.  Her fund of knowledge is adequate.  She reported no tremors shakes or any EPS.  She admitted weight gain due to lack of mobility.  Her insight and judgment is okay.  Assessment and Plan: Major depressive disorder, recurrent.  Generalized anxiety disorder.  Primary insomnia.  Discussed psychosocial stressors.  Discussed her stress and anxiety is due to situational as increased psychosocial stressors in her life.  I do believe she should see a therapist at symptoms started to get worse when she is not seeing therapy.  After some discussion she agreed to see a therapist in our office.  I also discussed that she should contact her lawyer to find out about the future of her legal battle with the sister Kendra Simmons.  Patient like to continue current medication.  She denies any dizziness, recent fall or any tremors.  She is taking multiple medication and very reluctant to cut down the  medication at this time.  I will continue doxepin 75 mg at bedtime, Lyrica 75 mg at bedtime, Wellbutrin XL 150 mg daily, Rozerem 8 mg at bedtime, Klonopin 0.5 mg daily as needed for anxiety and Seroquel 400 mg at bedtime.  We discussed medication side effects and benefits in detail.  I recommended to call us back if she has any concerns about the medication, suicidal thoughts or homicidal thoughts or worsening of the symptoms.  Follow-up in 2 months.  I spent more than 50% of the time in providing psychoeducation, counseling and coordination of care.  We will also schedule appointment to 1 of our therapist for therapy.  Follow Up Instructions:    I discussed the assessment and treatment plan with the patient. The patient was provided an opportunity to ask questions and all were answered. The patient agreed with the plan and demonstrated an understanding of the instructions.   The patient was advised to call back or seek an in-person evaluation if the symptoms worsen or if the condition fails to improve as anticipated.  I provided 30 minutes of non-face-to-face time during this encounter.   Kendra Nations, MD

## 2019-04-30 ENCOUNTER — Other Ambulatory Visit (INDEPENDENT_AMBULATORY_CARE_PROVIDER_SITE_OTHER): Payer: Self-pay | Admitting: Orthopaedic Surgery

## 2019-04-30 NOTE — Telephone Encounter (Signed)
Ok to rf? 

## 2019-05-03 ENCOUNTER — Other Ambulatory Visit (INDEPENDENT_AMBULATORY_CARE_PROVIDER_SITE_OTHER): Payer: Self-pay | Admitting: Orthopaedic Surgery

## 2019-05-04 NOTE — Telephone Encounter (Signed)
Please advise 

## 2019-05-16 ENCOUNTER — Other Ambulatory Visit (HOSPITAL_COMMUNITY): Payer: Self-pay | Admitting: Psychiatry

## 2019-05-16 DIAGNOSIS — F330 Major depressive disorder, recurrent, mild: Secondary | ICD-10-CM

## 2019-05-26 ENCOUNTER — Other Ambulatory Visit (INDEPENDENT_AMBULATORY_CARE_PROVIDER_SITE_OTHER): Payer: Self-pay | Admitting: Orthopaedic Surgery

## 2019-05-26 NOTE — Telephone Encounter (Signed)
Please advise 

## 2019-06-18 ENCOUNTER — Other Ambulatory Visit (HOSPITAL_COMMUNITY): Payer: Self-pay

## 2019-06-18 DIAGNOSIS — F330 Major depressive disorder, recurrent, mild: Secondary | ICD-10-CM

## 2019-06-18 MED ORDER — CLONAZEPAM 0.5 MG PO TABS: 0.5000 mg | ORAL_TABLET | Freq: Every day | ORAL | 0 refills | Status: DC | PRN

## 2019-06-19 ENCOUNTER — Other Ambulatory Visit (INDEPENDENT_AMBULATORY_CARE_PROVIDER_SITE_OTHER): Payer: Self-pay | Admitting: Orthopaedic Surgery

## 2019-06-22 NOTE — Telephone Encounter (Signed)
Please advise 

## 2019-06-24 ENCOUNTER — Encounter (HOSPITAL_COMMUNITY): Payer: Self-pay | Admitting: Psychiatry

## 2019-06-24 ENCOUNTER — Other Ambulatory Visit: Payer: Self-pay

## 2019-06-24 ENCOUNTER — Ambulatory Visit (INDEPENDENT_AMBULATORY_CARE_PROVIDER_SITE_OTHER): Payer: Medicare Other | Admitting: Psychiatry

## 2019-06-24 DIAGNOSIS — F5101 Primary insomnia: Secondary | ICD-10-CM | POA: Diagnosis not present

## 2019-06-24 DIAGNOSIS — F330 Major depressive disorder, recurrent, mild: Secondary | ICD-10-CM | POA: Diagnosis not present

## 2019-06-24 DIAGNOSIS — F411 Generalized anxiety disorder: Secondary | ICD-10-CM | POA: Diagnosis not present

## 2019-06-24 MED ORDER — CLONAZEPAM 0.5 MG PO TABS: 0.5000 mg | ORAL_TABLET | Freq: Every day | ORAL | 0 refills | Status: DC | PRN

## 2019-06-24 MED ORDER — RAMELTEON 8 MG PO TABS: 8.0000 mg | ORAL_TABLET | Freq: Every day | ORAL | 2 refills | Status: DC

## 2019-06-24 MED ORDER — DOXEPIN HCL 75 MG PO CAPS: 75.0000 mg | ORAL_CAPSULE | Freq: Every day | ORAL | 0 refills | Status: DC

## 2019-06-24 MED ORDER — BUPROPION HCL ER (XL) 150 MG PO TB24: ORAL_TABLET | ORAL | 0 refills | Status: DC

## 2019-06-24 MED ORDER — PREGABALIN 75 MG PO CAPS: 75.0000 mg | ORAL_CAPSULE | Freq: Every day | ORAL | 0 refills | Status: DC

## 2019-06-24 MED ORDER — QUETIAPINE FUMARATE 400 MG PO TABS: 400.0000 mg | ORAL_TABLET | Freq: Every day | ORAL | 0 refills | Status: DC

## 2019-06-24 NOTE — Progress Notes (Signed)
Virtual Visit via Telephone Note  I connected with Kendra Simmons on 06/24/19 at 10:20 AM EDT by telephone and verified that I am speaking with the correct person using two identifiers.   I discussed the limitations, risks, security and privacy concerns of performing an evaluation and management service by telephone and the availability of in person appointments. I also discussed with the patient that there may be a patient responsible charge related to this service. The patient expressed understanding and agreed to proceed.   History of Present Illness: Patient was evaluated through phone session.  Recently she had sign the papers about the land.  But she was not happy but she also relieved that it is over.  She admitted having a lot of anger towards the family because she spent a lot of money which could be prevented by mutual discussion with the family members.  She is not talking to her sisters.  She is getting along with her daughter who recently moved in after she lost the job.  Patient is sleeping good.  She is taking multiple medication and feel the current regimen is working well and she does not want to lower or decrease any of her medication.  This afternoon she has a procedure for her esophagus to stretch and she is little anxious but she had it in the October which went well.  She denies any crying spells or any feeling of hopelessness.  She denies any irritability, severe mood swings.  She admitted sadness due to family conflict but she like to move on.  Her energy level is okay.  She is not thinking about getting a job because she feels she want to spend time to herself and now relationship with her daughter is getting better she wants to have the company of her daughter.  She denies any paranoia or any hallucination.  She denies any tremors, shakes or any EPS.  She has no rash or itching.  Her appetite is okay.  She denies drinking or using any illegal substances.  Past Psychiatric  History:Reviewed. H/Omultiple hospitalization.Lasthospitalization in June 2016. MultipleIOP. H/O hallucination, paranoiaanddepression. In the past seenAlan Watt, [name] and Dr. Thurmond Butts. TriedXanax, amitriptyline, imipramine, temazepam, trazodone, Zyprexa, Luvox, Wellbutrin, Prozac, Vistaril, Remeronand Bellsomra.   Psychiatric Specialty Exam: Physical Exam  ROS  There were no vitals taken for this visit.There is no height or weight on file to calculate BMI.  General Appearance: NA  Eye Contact:  NA  Speech:  Clear and Coherent and Normal Rate  Volume:  Normal  Mood:  Dysphoric  Affect:  NA  Thought Process:  Goal Directed  Orientation:  Full (Time, Place, and Person)  Thought Content:  Rumination  Suicidal Thoughts:  No  Homicidal Thoughts:  No  Memory:  Immediate;   Fair Recent;   Good Remote;   Good  Judgement:  Good  Insight:  Good  Psychomotor Activity:  NA  Concentration:  Concentration: Fair and Attention Span: Fair  Recall:  Good  Fund of Knowledge:  Good  Language:  Good  Akathisia:  No  Handed:  Right  AIMS (if indicated):     Assets:  Communication Skills Desire for Improvement Housing Resilience  ADL's:  Intact  Cognition:  WNL  Sleep:   ok      Assessment and Plan: Major depressive disorder, recurrent.  Generalized anxiety disorder.  Primary insomnia.  Patient admitted some sadness due to family conflict and the way it ended by signing the land papers.  However she is relieved and like to move on.  She does not want to change medication.  Her anxiety depression and sleep is better.  We discussed polypharmacy but patient is reluctant to change or reduce any of the medication.  I will continue doxepin 75 mg at bedtime, Lyrica 75 mg at bedtime, Rozerem 8 mg at bedtime, Klonopin 0.5 mg as needed for anxiety and Seroquel 400 mg at bedtime.  She takes Wellbutrin 150 in the morning.  She has no tremors, shakes, serotonin syndrome-like symptoms.   She is interested in therapy however her insurance does not cover therapy appointment.  Discussed medication side effects and benefits.  Recommended to call us back if she has any question or any concern.  Follow-up in 3 months.  Follow Up Instructions:    I discussed the assessment and treatment plan with the patient. The patient was provided an opportunity to ask questions and all were answered. The patient agreed with the plan and demonstrated an understanding of the instructions.   The patient was advised to call back or seek an in-person evaluation if the symptoms worsen or if the condition fails to improve as anticipated.  I provided 20 minutes of non-face-to-face time during this encounter.   Kendra Nations, MD

## 2019-06-27 ENCOUNTER — Other Ambulatory Visit (INDEPENDENT_AMBULATORY_CARE_PROVIDER_SITE_OTHER): Payer: Self-pay | Admitting: Orthopaedic Surgery

## 2019-06-29 NOTE — Telephone Encounter (Signed)
Please advise 

## 2019-07-01 ENCOUNTER — Other Ambulatory Visit (INDEPENDENT_AMBULATORY_CARE_PROVIDER_SITE_OTHER): Payer: Self-pay | Admitting: Orthopaedic Surgery

## 2019-07-07 ENCOUNTER — Other Ambulatory Visit (HOSPITAL_COMMUNITY): Payer: Self-pay | Admitting: Psychiatry

## 2019-07-07 DIAGNOSIS — F411 Generalized anxiety disorder: Secondary | ICD-10-CM

## 2019-07-07 DIAGNOSIS — F330 Major depressive disorder, recurrent, mild: Secondary | ICD-10-CM

## 2019-07-14 ENCOUNTER — Other Ambulatory Visit (INDEPENDENT_AMBULATORY_CARE_PROVIDER_SITE_OTHER): Payer: Self-pay | Admitting: Orthopaedic Surgery

## 2019-07-14 NOTE — Telephone Encounter (Signed)
Please advise 

## 2019-07-22 ENCOUNTER — Other Ambulatory Visit (INDEPENDENT_AMBULATORY_CARE_PROVIDER_SITE_OTHER): Payer: Self-pay | Admitting: Orthopaedic Surgery

## 2019-07-22 NOTE — Telephone Encounter (Signed)
Please advise 

## 2019-08-09 ENCOUNTER — Other Ambulatory Visit (INDEPENDENT_AMBULATORY_CARE_PROVIDER_SITE_OTHER): Payer: Self-pay | Admitting: Orthopaedic Surgery

## 2019-08-12 ENCOUNTER — Other Ambulatory Visit (INDEPENDENT_AMBULATORY_CARE_PROVIDER_SITE_OTHER): Payer: Self-pay | Admitting: Orthopaedic Surgery

## 2019-08-31 ENCOUNTER — Other Ambulatory Visit (INDEPENDENT_AMBULATORY_CARE_PROVIDER_SITE_OTHER): Payer: Self-pay | Admitting: Physician Assistant

## 2019-08-31 NOTE — Telephone Encounter (Signed)
Please advise 

## 2019-09-09 ENCOUNTER — Other Ambulatory Visit (INDEPENDENT_AMBULATORY_CARE_PROVIDER_SITE_OTHER): Payer: Self-pay | Admitting: Orthopaedic Surgery

## 2019-09-16 ENCOUNTER — Other Ambulatory Visit (HOSPITAL_COMMUNITY): Payer: Self-pay | Admitting: Psychiatry

## 2019-09-16 DIAGNOSIS — F330 Major depressive disorder, recurrent, mild: Secondary | ICD-10-CM

## 2019-09-24 ENCOUNTER — Other Ambulatory Visit: Payer: Self-pay

## 2019-09-24 ENCOUNTER — Encounter (HOSPITAL_COMMUNITY): Payer: Self-pay | Admitting: Psychiatry

## 2019-09-24 ENCOUNTER — Ambulatory Visit (INDEPENDENT_AMBULATORY_CARE_PROVIDER_SITE_OTHER): Payer: Medicare Other | Admitting: Psychiatry

## 2019-09-24 DIAGNOSIS — F5101 Primary insomnia: Secondary | ICD-10-CM | POA: Diagnosis not present

## 2019-09-24 DIAGNOSIS — F330 Major depressive disorder, recurrent, mild: Secondary | ICD-10-CM

## 2019-09-24 DIAGNOSIS — F411 Generalized anxiety disorder: Secondary | ICD-10-CM

## 2019-09-24 MED ORDER — RAMELTEON 8 MG PO TABS: 8.0000 mg | ORAL_TABLET | Freq: Every day | ORAL | 2 refills | Status: DC

## 2019-09-24 MED ORDER — BUPROPION HCL ER (XL) 150 MG PO TB24: ORAL_TABLET | ORAL | 0 refills | Status: DC

## 2019-09-24 MED ORDER — DOXEPIN HCL 75 MG PO CAPS: 75.0000 mg | ORAL_CAPSULE | Freq: Every day | ORAL | 0 refills | Status: DC

## 2019-09-24 MED ORDER — PREGABALIN 75 MG PO CAPS: 75.0000 mg | ORAL_CAPSULE | Freq: Every day | ORAL | 0 refills | Status: DC

## 2019-09-24 MED ORDER — QUETIAPINE FUMARATE 300 MG PO TABS: 300.0000 mg | ORAL_TABLET | Freq: Every day | ORAL | 2 refills | Status: DC

## 2019-09-24 MED ORDER — CLONAZEPAM 0.5 MG PO TABS: 0.5000 mg | ORAL_TABLET | Freq: Every day | ORAL | 0 refills | Status: DC | PRN

## 2019-09-24 NOTE — Progress Notes (Signed)
Virtual Visit via Telephone Note  I connected with Kendra Simmons on 09/24/19 at 10:20 AM EDT by telephone and verified that I am speaking with the correct person using two identifiers.   I discussed the limitations, risks, security and privacy concerns of performing an evaluation and management service by telephone and the availability of in person appointments. I also discussed with the patient that there may be a patient responsible charge related to this service. The patient expressed understanding and agreed to proceed.   History of Present Illness: Patient was evaluated through phone session.  She admitted some anxiety about her 67 year old handicapped daughter who lives in [place] in a group home.  She has not talked to her and seen her since COVID.  Her birthday is coming in mid October and she is not sure what to do.  Patient knows that she will be not allowed to visit her due to [place].  She also concerned about her other daughter who is staying with her and not motivated to get a job.  Patient is helping her daughter but wishes that she can do at least part-time job.  She is sleeping good.  Recently she had a procedure to stretch her esophagus and it did work but is still she has some time issues.  Her physician told the next step will be Botox and she cannot afford.  She had some time ruminative thoughts but denies any crying spells, feeling of hopelessness or worthlessness.  She is taking multiple psychotropic medication and we have discussed multiple times to try to cut down but she is reluctant.  She does not want to change medication because she is sleeping better with the regimen.  She denies any paranoia, hallucination, mania, psychosis or any agitation.  She denies drinking or using any illegal substances.  She has no rash, itching tremors or shakes.  Her energy level is fair.  Patient told her weight is unchanged from the past.   Past Psychiatric  History:Reviewed. H/Omultiple hospitalization.Lastinpatient June 2016. MultipleIOP. H/O hallucination, paranoiaanddepression. In the past seenAlan Watt, [name] and Dr. Thurmond Butts. TriedXanax, amitriptyline, imipramine, temazepam, trazodone, Zyprexa, Luvox, Wellbutrin, Prozac, Vistaril, Remeronand Bellsomra.  Psychiatric Specialty Exam: Physical Exam  ROS  There were no vitals taken for this visit.There is no height or weight on file to calculate BMI.  General Appearance: NA  Eye Contact:  NA  Speech:  Clear and Coherent and Slow  Volume:  Normal  Mood:  Euthymic  Affect:  NA  Thought Process:  Goal Directed  Orientation:  Full (Time, Place, and Person)  Thought Content:  WDL  Suicidal Thoughts:  No  Homicidal Thoughts:  No  Memory:  Immediate;   Fair Recent;   Good Remote;   Good  Judgement:  Good  Insight:  Good  Psychomotor Activity:  NA  Concentration:  Concentration: Fair and Attention Span: Fair  Recall:  Good  Fund of Knowledge:  Good  Language:  Good  Akathisia:  No  Handed:  Right  AIMS (if indicated):     Assets:  Communication Skills Desire for Improvement Housing  ADL's:  Intact  Cognition:  WNL  Sleep:   fair     Assessment and Plan: Major depressive disorder, recurrent.  Generalized anxiety disorder.  Primary insomnia.  I discussed her psychosocial issues.  One more time I encourage therapy but patient is not interested.  She like to keep the medication however I insist that she should try cutting down the Seroquel as  patient already taking Klonopin, Rozerem, doxepin, Lyrica, Seroquel, Wellbutrin, muscle relaxant and sometimes tramadol.  After some discussion she agreed to give a trial of Seroquel dose to reduce 300 mg.  I suggested if she started to have any mood symptoms, irritability, lack of sleep or anxiety then she should call us back.  We will reduce Seroquel to 300 mg at bedtime, continue Lyrica 75 mg at bedtime, Rozerem 8 mg at  bedtime, doxepin 75 mg at bedtime, Wellbutrin from 50 in the morning and Klonopin 0.5 mg as needed for anxiety.  Recommended to call us back if she has any question or any concern.  Follow-up in 3 months.  Follow Up Instructions:    I discussed the assessment and treatment plan with the patient. The patient was provided an opportunity to ask questions and all were answered. The patient agreed with the plan and demonstrated an understanding of the instructions.   The patient was advised to call back or seek an in-person evaluation if the symptoms worsen or if the condition fails to improve as anticipated.  I provided 20 minutes of non-face-to-face time during this encounter.   [name], MD

## 2019-09-28 ENCOUNTER — Telehealth (HOSPITAL_COMMUNITY): Payer: Self-pay

## 2019-09-28 DIAGNOSIS — F330 Major depressive disorder, recurrent, mild: Secondary | ICD-10-CM

## 2019-09-28 MED ORDER — QUETIAPINE FUMARATE 400 MG PO TABS: 400.0000 mg | ORAL_TABLET | Freq: Every day | ORAL | 0 refills | Status: DC

## 2019-09-28 NOTE — Telephone Encounter (Signed)
Patient is calling to let you know that decreasing the Seroquel was not a good idea for her, patient states that she has only slept 2 -3 hours a night since the decrease. Patient still has a bottle of the 400 mg and she wants to see if it is okay to go back up to 400 mg. Please review and advise, thank you

## 2019-09-28 NOTE — Telephone Encounter (Signed)
She can go back to Seroquel 400 mg at bedtime.  Please call her pharmacy for Seroquel 400 mg to take at bedtime.

## 2019-09-28 NOTE — Telephone Encounter (Signed)
Sent in the 400 mg and made patient aware of the increase.

## 2019-10-06 ENCOUNTER — Other Ambulatory Visit (INDEPENDENT_AMBULATORY_CARE_PROVIDER_SITE_OTHER): Payer: Self-pay | Admitting: Orthopaedic Surgery

## 2019-10-06 NOTE — Telephone Encounter (Signed)
Please advise 

## 2019-10-07 NOTE — Telephone Encounter (Signed)
Please advise 

## 2019-10-22 ENCOUNTER — Other Ambulatory Visit (HOSPITAL_COMMUNITY): Payer: Self-pay | Admitting: Psychiatry

## 2019-10-22 DIAGNOSIS — F330 Major depressive disorder, recurrent, mild: Secondary | ICD-10-CM

## 2019-10-23 ENCOUNTER — Other Ambulatory Visit (HOSPITAL_COMMUNITY): Payer: Self-pay | Admitting: Psychiatry

## 2019-10-23 DIAGNOSIS — F330 Major depressive disorder, recurrent, mild: Secondary | ICD-10-CM

## 2019-10-23 MED ORDER — QUETIAPINE FUMARATE 400 MG PO TABS: 400.0000 mg | ORAL_TABLET | Freq: Every day | ORAL | 1 refills | Status: DC

## 2019-10-29 ENCOUNTER — Other Ambulatory Visit (INDEPENDENT_AMBULATORY_CARE_PROVIDER_SITE_OTHER): Payer: Self-pay | Admitting: Orthopaedic Surgery

## 2019-10-29 NOTE — Telephone Encounter (Signed)
Please advise 

## 2019-10-31 ENCOUNTER — Other Ambulatory Visit (INDEPENDENT_AMBULATORY_CARE_PROVIDER_SITE_OTHER): Payer: Self-pay | Admitting: Orthopaedic Surgery

## 2019-11-02 NOTE — Telephone Encounter (Signed)
Please advise 

## 2019-11-10 ENCOUNTER — Other Ambulatory Visit (INDEPENDENT_AMBULATORY_CARE_PROVIDER_SITE_OTHER): Payer: Self-pay | Admitting: Orthopaedic Surgery

## 2019-11-10 NOTE — Telephone Encounter (Signed)
Please advise 

## 2019-11-21 ENCOUNTER — Other Ambulatory Visit (HOSPITAL_COMMUNITY): Payer: Self-pay | Admitting: Psychiatry

## 2019-11-21 DIAGNOSIS — F330 Major depressive disorder, recurrent, mild: Secondary | ICD-10-CM

## 2019-11-26 ENCOUNTER — Other Ambulatory Visit (INDEPENDENT_AMBULATORY_CARE_PROVIDER_SITE_OTHER): Payer: Self-pay | Admitting: Orthopaedic Surgery

## 2019-11-26 ENCOUNTER — Other Ambulatory Visit: Payer: Self-pay | Admitting: Orthopaedic Surgery

## 2019-11-26 MED ORDER — TRAMADOL HCL 50 MG PO TABS: 50.0000 mg | ORAL_TABLET | Freq: Four times a day (QID) | ORAL | 0 refills | Status: DC | PRN

## 2019-11-26 NOTE — Telephone Encounter (Signed)
Called patient multiple times, no answer no voicemail option

## 2019-11-26 NOTE — Telephone Encounter (Signed)
Please advise 

## 2019-11-26 NOTE — Telephone Encounter (Signed)
Do call her and let her know that this is the last time I can provide a narcotic for her.  She has been on them long enough.

## 2019-11-30 ENCOUNTER — Telehealth (HOSPITAL_COMMUNITY): Payer: Self-pay

## 2019-11-30 ENCOUNTER — Telehealth: Payer: Self-pay

## 2019-11-30 ENCOUNTER — Other Ambulatory Visit: Payer: Self-pay | Admitting: Radiology

## 2019-11-30 DIAGNOSIS — M5441 Lumbago with sciatica, right side: Secondary | ICD-10-CM

## 2019-11-30 DIAGNOSIS — S8264XD Nondisplaced fracture of lateral malleolus of right fibula, subsequent encounter for closed fracture with routine healing: Secondary | ICD-10-CM

## 2019-11-30 DIAGNOSIS — M25571 Pain in right ankle and joints of right foot: Secondary | ICD-10-CM

## 2019-11-30 MED ORDER — TIZANIDINE HCL 4 MG PO TABS: 4.0000 mg | ORAL_TABLET | Freq: Two times a day (BID) | ORAL | 0 refills | Status: DC | PRN

## 2019-11-30 NOTE — Telephone Encounter (Signed)
I refilled that medication and recently refilled her tramadol.  Please call her and let her know that I can not keep her on these medications any more and does she wish to have a referral to pain management.

## 2019-11-30 NOTE — Telephone Encounter (Signed)
Requests refill of Tizanidine

## 2019-11-30 NOTE — Telephone Encounter (Signed)
Called patient, patient is aware cannot stay on these medications and wants referral to pain management.

## 2019-11-30 NOTE — Telephone Encounter (Signed)
She was given enough refills to last her appointment in January.  If she requested a 90 days supply of her Rozerem then we will discuss on her next appointment.

## 2019-11-30 NOTE — Telephone Encounter (Signed)
Medication refill request - Fax from patient's [facility] requesting a 90 day order for her Ramelteon.

## 2019-12-08 ENCOUNTER — Telehealth (HOSPITAL_COMMUNITY): Payer: Self-pay | Admitting: *Deleted

## 2019-12-08 NOTE — Telephone Encounter (Signed)
Patient called stating that she is needing to discuss her medications with her doctor. Her current medications are costing too much and she is needing to change some things. Asking if he could call her back to discuss options.

## 2019-12-14 ENCOUNTER — Other Ambulatory Visit: Payer: Self-pay | Admitting: Orthopaedic Surgery

## 2019-12-14 NOTE — Telephone Encounter (Signed)
Ok to refill? She is pending pain mgmt referral

## 2019-12-16 NOTE — Telephone Encounter (Signed)
Call return.  Patient told that she does not want to change medication but she does not have extra financial help that used to cover her on all her medications doxepin, Lyrica and Rozerem are very expensive.  She is working with the pharmacist to see what medicines she can get it from [facility].  I also recommend she can try over-the-counter melatonin instead of Rozerem but again she is resistant to change the medication since they are working well.  She like to discuss more on her next appointment which is 10 days from now and at that time she has some information about patient's assistance program.  I agree that we will discuss more on her schedule appointment to see what our other options.

## 2019-12-23 ENCOUNTER — Other Ambulatory Visit: Payer: Self-pay

## 2019-12-23 ENCOUNTER — Encounter (INDEPENDENT_AMBULATORY_CARE_PROVIDER_SITE_OTHER): Payer: Self-pay

## 2019-12-23 ENCOUNTER — Encounter
Payer: Medicare Other | Attending: Physical Medicine and Rehabilitation | Admitting: Physical Medicine and Rehabilitation

## 2019-12-23 ENCOUNTER — Encounter: Payer: Self-pay | Admitting: Physical Medicine and Rehabilitation

## 2019-12-23 VITALS — BP 145/84 | HR 96 | Temp 97.7°F | Ht 64.0 in | Wt 200.0 lb

## 2019-12-23 DIAGNOSIS — T50905S Adverse effect of unspecified drugs, medicaments and biological substances, sequela: Secondary | ICD-10-CM

## 2019-12-23 DIAGNOSIS — M25551 Pain in right hip: Secondary | ICD-10-CM | POA: Diagnosis not present

## 2019-12-23 MED ORDER — TIZANIDINE HCL 4 MG PO TABS: 4.0000 mg | ORAL_TABLET | Freq: Two times a day (BID) | ORAL | 0 refills | Status: DC | PRN

## 2019-12-23 NOTE — Progress Notes (Signed)
Subjective:    Patient ID: Kendra Simmons, female    DOB: [birth year], 69 y.o.   MRN: [id number]  HPI  Kendra Simmons is a 69 year-old woman who presents today with lower back pain and right sided hip pain. Her pain is currently well controlled. She has been taking Tizanidine 4mg  HS, sometimes 6mg  if she is having trouble sleeping. She has Tramadol prescribed but has been taking this sparingly and does not feel much relief from it. She has done therapy in the past and does not want to again at this time, though she knows she needs to exercise more. She used to walk in her church, but it is closed to walking now due to the pandemic, and she is unable to walk outside due to the cold.   Pain Inventory Average Pain 4 Pain Right Now 0 My pain is intermittent and aching  In the last 24 hours, has pain interfered with the following? General activity 1 Relation with others 1 Enjoyment of life 1 What TIME of day is your pain at its worst? morning , night Sleep (in general) Good  Pain is worse with: bending and some activites Pain improves with: rest, heat/ice, pacing activities and medication Relief from Meds: 6  Mobility walk without assistance ability to climb steps?  yes do you drive?  yes Do you have any goals in this area?  yes  Function retired Do you have any goals in this area?  no  Neuro/Psych tremor depression anxiety  Prior Studies new  Physicians involved in your care new   Family History  Problem Relation Age of Onset   Sleep apnea Father    Alcohol abuse Father    Diabetes Mother    Diabetes Sister    Diabetes Maternal Uncle    Diabetes Cousin    Social History   Socioeconomic History   Marital status: Divorced    Spouse name: Not on file   Number of children: 3   Years of education: 14   Highest education level: Not on file  Occupational History   Occupation: Retired  Tobacco Use   Smoking status: Never Smoker   Smokeless  tobacco: Never Used  Substance and Sexual Activity   Alcohol use: No    Alcohol/week: 0.0 standard drinks    Comment: zero   Drug use: No    Types: Barbituates, Benzodiazepines    Comment: Denies any drug use other than benzos   Sexual activity: Never    Birth control/protection: Abstinence  Other Topics Concern   Not on file  Social History Narrative   Patient lives at home alone.   Caffeine Use:  2 cokes daily   Sister lives next door.   Social Determinants of Health   Financial Resource Strain:    Difficulty of Paying Living Expenses: Not on file  Food Insecurity:    Worried About Charity fundraiser in the Last Year: Not on file   [employer] of Food in the Last Year: Not on file  Transportation Needs:    Lack of Transportation (Medical): Not on file   Lack of Transportation (Non-Medical): Not on file  Physical Activity:    Days of Exercise per Week: Not on file   Minutes of Exercise per Session: Not on file  Stress:    Feeling of Stress : Not on file  Social Connections:    Frequency of Communication with Friends and Family: Not on file   Frequency of Social Gatherings  with Friends and Family: Not on file   Attends Religious Services: Not on file   Active Member of Clubs or Organizations: Not on file   Attends Archivist Meetings: Not on file   Marital Status: Not on file   Past Surgical History:  Procedure Laterality Date   CESAREAN SECTION     [place] N/A 09/26/2016   Procedure: ESOPHAGEAL MANOMETRY (EM);  Surgeon: Clarene Essex, MD;  Location: [facility];  Service: Endoscopy;  Laterality: N/A;   laproscopy     Past Medical History:  Diagnosis Date   Anxiety    Arthritis    Depression    Emotional depression 02/04/2015   GERD (gastroesophageal reflux disease)    High cholesterol    Insomnia    Memory loss    Temp 97.7 F (36.5 C)    Ht 5\' 4"  (1.626 m)    Wt 200 lb (90.7 kg)    BMI  34.33 kg/m   Opioid Risk Score:   Fall Risk Score:  `1  Depression screen PHQ 2/9  No flowsheet data found.  Review of Systems  Constitutional: Positive for unexpected weight change.  HENT: Negative.   Eyes: Negative.   Respiratory: Negative.   Gastrointestinal: Positive for constipation.  Endocrine: Negative.   Genitourinary: Negative.   Musculoskeletal: Positive for arthralgias, back pain, neck pain and neck stiffness.  Skin: Negative.   Neurological: Positive for tremors.  Hematological: Bruises/bleeds easily.  Psychiatric/Behavioral: Positive for dysphoric mood. The patient is nervous/anxious.   All other systems reviewed and are negative.      Objective:   Physical Exam Gen: no distress, normal appearing HEENT: oral mucosa pink and moist, NCAT Cardio: Reg rate Chest: normal effort, normal rate of breathing Abd: soft, non-distended Ext: no edema Skin: intact Neuro: Aox3.  Musculoskeletal: 5/5 strength throughout except 4/5 on right hip abduction.  Psych: pleasant, normal affect    Assessment & Plan:  Kendra Simmons is a 69 year-old woman who presents today with lower back pain and right sided hip pain.  Right sided greater trochanteric pain syndrome --Pain is currently very well controlled. She finds that the Tizanidine is more helpful than the Tramadol.  --Will order LFTs today since she has been taking Tizanidine daily. I will call her tomorrow to discuss the results. Ordered Tizanidine 4mg  BID PRN to help with muscle spasms. During the day, recommended that she minimize Tramadol and take Tylenol if needed for pain during her housework. --Emphasized the importance of exercise in maintaining health and minimizing the weight on her joints. She understands and says she will try to walk more.   Thirty minutes of face to face patient care time were spent during this visit. All questions were encouraged and answered. Follow up with me in 4 weeks.

## 2019-12-24 ENCOUNTER — Encounter (HOSPITAL_COMMUNITY): Payer: Self-pay | Admitting: Psychiatry

## 2019-12-24 ENCOUNTER — Ambulatory Visit (INDEPENDENT_AMBULATORY_CARE_PROVIDER_SITE_OTHER): Payer: Medicare Other | Admitting: Psychiatry

## 2019-12-24 ENCOUNTER — Other Ambulatory Visit: Payer: Self-pay

## 2019-12-24 DIAGNOSIS — F330 Major depressive disorder, recurrent, mild: Secondary | ICD-10-CM

## 2019-12-24 DIAGNOSIS — F411 Generalized anxiety disorder: Secondary | ICD-10-CM | POA: Diagnosis not present

## 2019-12-24 DIAGNOSIS — F5101 Primary insomnia: Secondary | ICD-10-CM

## 2019-12-24 LAB — COMPREHENSIVE METABOLIC PANEL
ALT: 24 IU/L (ref 0–32)
AST: 27 IU/L (ref 0–40)
Albumin/Globulin Ratio: 2.3 — ABNORMAL HIGH (ref 1.2–2.2)
Albumin: 5.2 g/dL — ABNORMAL HIGH (ref 3.8–4.8)
Alkaline Phosphatase: 81 IU/L (ref 39–117)
BUN/Creatinine Ratio: 23 (ref 12–28)
BUN: 23 mg/dL (ref 8–27)
Bilirubin Total: 0.2 mg/dL (ref 0.0–1.2)
CO2: 22 mmol/L (ref 20–29)
Calcium: 10.8 mg/dL — ABNORMAL HIGH (ref 8.7–10.3)
Chloride: 103 mmol/L (ref 96–106)
Creatinine, Ser: 1.02 mg/dL — ABNORMAL HIGH (ref 0.57–1.00)
GFR calc Af Amer: 65 mL/min/{1.73_m2} (ref >59)
GFR calc non Af Amer: 57 mL/min/{1.73_m2} — ABNORMAL LOW (ref >59)
Globulin, Total: 2.3 g/dL (ref 1.5–4.5)
Glucose: 125 mg/dL — ABNORMAL HIGH (ref 65–99)
Potassium: 4.5 mmol/L (ref 3.5–5.2)
Sodium: 140 mmol/L (ref 134–144)
Total Protein: 7.5 g/dL (ref 6.0–8.5)

## 2019-12-24 MED ORDER — CLONAZEPAM 0.5 MG PO TABS: 0.5000 mg | ORAL_TABLET | Freq: Every day | ORAL | 0 refills | Status: DC | PRN

## 2019-12-24 MED ORDER — PREGABALIN 50 MG PO CAPS: 50.0000 mg | ORAL_CAPSULE | Freq: Every day | ORAL | 0 refills | Status: DC

## 2019-12-24 MED ORDER — QUETIAPINE FUMARATE 400 MG PO TABS: 400.0000 mg | ORAL_TABLET | Freq: Every day | ORAL | 1 refills | Status: DC

## 2019-12-24 MED ORDER — BUPROPION HCL ER (XL) 150 MG PO TB24: ORAL_TABLET | ORAL | 0 refills | Status: DC

## 2019-12-24 MED ORDER — RAMELTEON 8 MG PO TABS: 8.0000 mg | ORAL_TABLET | Freq: Every day | ORAL | 2 refills | Status: DC

## 2019-12-24 MED ORDER — DOXEPIN HCL 75 MG PO CAPS: 75.0000 mg | ORAL_CAPSULE | Freq: Every day | ORAL | 0 refills | Status: DC

## 2019-12-24 NOTE — Progress Notes (Signed)
Virtual Visit via Telephone Note  I connected with Kendra Simmons on 12/24/19 at 11:00 AM EST by telephone and verified that I am speaking with the correct person using two identifiers.   I discussed the limitations, risks, security and privacy concerns of performing an evaluation and management service by telephone and the availability of in person appointments. I also discussed with the patient that there may be a patient responsible charge related to this service. The patient expressed understanding and agreed to proceed.   History of Present Illness: Patient was evaluated through phone session.  She is relieved since she was able to find resources to get help to fill the prescription.  Patient told her physicians staff were able to get in touch with the Medicare who may help some of the medication.  She also seen recently pain specialist and she like to continue pain management with a new person.  Her Christmas was quite.  She has not talked to her daughter in a while.  She is taking a lot of medication but she feels they are working very well.  She is sleeping good.  She denies any highs and lows, irritability, anger, crying spells with feeling of hopelessness or worthlessness.  She admitted weight gain but she is not walking and exercising.  Due to cold weather she cannot go outside and [place].  She has no tremors or shakes.  Her energy level is okay.  She did feel that she does not need a therapy since things are going well.    Past Psychiatric History:Reviewed. H/Omultiple hospitalization.Lastinpatient June 2016. MultipleIOP. H/O hallucination, paranoiaanddepression. In the past seenAlan Watt, [name] and Dr. Thurmond Butts. TriedXanax, amitriptyline, imipramine, temazepam, trazodone, Zyprexa, Luvox, Wellbutrin, Prozac, Vistaril, Remeronand Bellsomra.   Psychiatric Specialty Exam: Physical Exam  Review of Systems   There were no vitals taken for this visit.There is no height or weight on file to calculate BMI.  General Appearance: NA  Eye Contact:  NA  Speech:  Clear and Coherent  Volume:  Normal  Mood:  Euthymic  Affect:  NA  Thought Process:  Goal Directed  Orientation:  Full (Time, Place, and Person)  Thought Content:  WDL  Suicidal Thoughts:  No  Homicidal Thoughts:  No  Memory:  Immediate;   Good Recent;   Good Remote;   Good  Judgement:  Intact  Insight:  Present  Psychomotor Activity:  NA  Concentration:  Concentration: Fair and Attention Span: Fair  Recall:  Good  Fund of Knowledge:  Good  Language:  Good  Akathisia:  No  Handed:  Right  AIMS (if indicated):     Assets:  Communication Skills Desire for Improvement Housing Resilience Social Support  ADL's:  Intact  Cognition:  WNL  Sleep:   good      Assessment and Plan: Major depressive disorder, recurrent.  Generalized anxiety disorder.  Primary insomnia.  I reviewed her medication.  She is not taking any more tramadol but like to continue tizanidine.  We talked about multiple medication especially benzodiazepine, Lyrica, high-dose Seroquel, Rozerem and doxepin.  I recommend she should try to cut down her Lyrica since taking muscle relaxant, Klonopin and other medication.  After some discussion she agreed to reduce the dose of Lyrica to 50 mg.  She does not want to stop or reduce other medication dosage for now.  However she reported that she will try in the future.  She does  not feel she need therapist.  In the past we have tried cutting down Seroquel to 300 but she went back to 400 because she could not sleep.  For now continue doxepin 75 mg at bedtime, Wellbutrin 150 mg in the morning, Klonopin 0.5 mg as needed for anxiety, Rozerem 8 mg at bedtime, Seroquel 400 mg at bedtime and reduce Lyrica to milligrams at bedtime.  Recommended to call us back if she is any question of any concern.  Follow-up in 3 months.  Follow Up  Instructions:    I discussed the assessment and treatment plan with the patient. The patient was provided an opportunity to ask questions and all were answered. The patient agreed with the plan and demonstrated an understanding of the instructions.   The patient was advised to call back or seek an in-person evaluation if the symptoms worsen or if the condition fails to improve as anticipated.  I provided 20 minutes of non-face-to-face time during this encounter.   [name], MD

## 2019-12-26 ENCOUNTER — Other Ambulatory Visit: Payer: Self-pay | Admitting: Orthopaedic Surgery

## 2019-12-28 NOTE — Telephone Encounter (Signed)
Please advise 

## 2020-01-04 ENCOUNTER — Other Ambulatory Visit: Payer: Self-pay | Admitting: Orthopaedic Surgery

## 2020-01-19 ENCOUNTER — Other Ambulatory Visit: Payer: Self-pay | Admitting: Family Medicine

## 2020-01-19 ENCOUNTER — Other Ambulatory Visit (HOSPITAL_COMMUNITY)
Admission: RE | Admit: 2020-01-19 | Discharge: 2020-01-19 | Disposition: A | Discharge status: Home / Self Care | Payer: Medicare Other | Source: Ambulatory Visit | Attending: Family Medicine | Admitting: Family Medicine

## 2020-01-19 DIAGNOSIS — Z1151 Encounter for screening for human papillomavirus (HPV): Secondary | ICD-10-CM | POA: Diagnosis not present

## 2020-01-19 DIAGNOSIS — Z124 Encounter for screening for malignant neoplasm of cervix: Secondary | ICD-10-CM | POA: Diagnosis present

## 2020-01-20 ENCOUNTER — Ambulatory Visit
Admission: RE | Admit: 2020-01-20 | Discharge: 2020-01-20 | Disposition: A | Discharge status: Home / Self Care | Payer: Medicare Other | Source: Ambulatory Visit | Attending: Family Medicine | Admitting: Family Medicine

## 2020-01-20 ENCOUNTER — Other Ambulatory Visit: Payer: Self-pay

## 2020-01-20 ENCOUNTER — Other Ambulatory Visit: Payer: Self-pay | Admitting: Family Medicine

## 2020-01-20 DIAGNOSIS — R52 Pain, unspecified: Secondary | ICD-10-CM

## 2020-01-21 ENCOUNTER — Other Ambulatory Visit: Payer: Self-pay

## 2020-01-21 LAB — CYTOLOGY - PAP
Chlamydia: NEGATIVE
Comment: NEGATIVE
Comment: NEGATIVE
Comment: NORMAL
Diagnosis: NEGATIVE
High risk HPV: NEGATIVE
Neisseria Gonorrhea: NEGATIVE

## 2020-01-21 MED ORDER — DICLOFENAC SODIUM 1 % EX GEL: 2.0000 g | Freq: Four times a day (QID) | CUTANEOUS | 3 refills | Status: DC

## 2020-01-22 ENCOUNTER — Other Ambulatory Visit: Payer: Self-pay | Admitting: Family Medicine

## 2020-01-22 DIAGNOSIS — Z1231 Encounter for screening mammogram for malignant neoplasm of breast: Secondary | ICD-10-CM

## 2020-01-25 ENCOUNTER — Encounter
Payer: Medicare Other | Attending: Physical Medicine and Rehabilitation | Admitting: Physical Medicine and Rehabilitation

## 2020-01-25 ENCOUNTER — Encounter: Payer: Self-pay | Admitting: Physical Medicine and Rehabilitation

## 2020-01-25 ENCOUNTER — Other Ambulatory Visit: Payer: Self-pay

## 2020-01-25 VITALS — BP 144/85 | HR 110 | Temp 97.5°F | Ht 64.0 in | Wt 200.0 lb

## 2020-01-25 DIAGNOSIS — M545 Low back pain, unspecified: Secondary | ICD-10-CM

## 2020-01-25 DIAGNOSIS — M25551 Pain in right hip: Secondary | ICD-10-CM | POA: Insufficient documentation

## 2020-01-25 DIAGNOSIS — G8929 Other chronic pain: Secondary | ICD-10-CM | POA: Insufficient documentation

## 2020-01-25 DIAGNOSIS — T50905S Adverse effect of unspecified drugs, medicaments and biological substances, sequela: Secondary | ICD-10-CM | POA: Insufficient documentation

## 2020-01-25 DIAGNOSIS — E119 Type 2 diabetes mellitus without complications: Secondary | ICD-10-CM | POA: Diagnosis not present

## 2020-01-25 MED ORDER — METHOCARBAMOL 500 MG PO TABS: 500.0000 mg | ORAL_TABLET | Freq: Four times a day (QID) | ORAL | 1 refills | Status: DC

## 2020-01-25 MED ORDER — TIZANIDINE HCL 4 MG PO TABS: 4.0000 mg | ORAL_TABLET | Freq: Two times a day (BID) | ORAL | 0 refills | Status: DC | PRN

## 2020-01-25 NOTE — Progress Notes (Signed)
Subjective:    Patient ID: Kendra Simmons, female    DOB: Jul 13, 1951, 69 y.o.   MRN: [id number]  HPI  Kendra Simmons is a 69 year-old woman who presents today for follow-up of her lower back pain and right sided hip pain. Her pain is usually well controlled but she often has spasms during the day and before she sleeps at night. She has been taking Tizanidine 4mg  HS and this has been helping her. She has Tramadol prescribed but has been taking this sparingly and does not feel much relief from it. Once when she had additional spasms during the day she took the Tizanidine and felt very sleepy afterward She has done therapy in the past and does not want to again at this time, though she knows she needs to exercise more. She used to walk in her church, but it is closed to walking now due to the pandemic, and she is unable to walk outside due to the cold.   During today's visit she discusses extensively about her family stressors and how they are impacting her quality of life. She has regular appointments with behavioral health as well that she finds very helpful.    Pain Inventory Average Pain 6 Pain Right Now 7 My pain is sharp  In the last 24 hours, has pain interfered with the following? General activity 7 Relation with others 4 Enjoyment of life 6 What TIME of day is your pain at its worst? morning,night  Sleep (in general) Good  Pain is worse with: bending Pain improves with: medication Relief from Meds: 8  Mobility walk without assistance how many minutes can you walk? 15 ability to climb steps?  yes do you drive?  yes Do you have any goals in this area?  yes  Function retired I need assistance with the following:  household duties Do you have any goals in this area?  no  Neuro/Psych depression anxiety  Prior Studies Any changes since last visit?  no  Physicians involved in your care Any changes since last visit?  no   Family History  Problem Relation  Age of Onset  . Sleep apnea Father   . Alcohol abuse Father   . Diabetes Mother   . Diabetes Sister   . Diabetes Maternal Uncle   . Diabetes Cousin    Social History   Socioeconomic History  . Marital status: Divorced    Spouse name: Not on file  . Number of children: 3  . Years of education: 38  . Highest education level: Not on file  Occupational History  . Occupation: Retired  Tobacco Use  . Smoking status: Never Smoker  . Smokeless tobacco: Never Used  Substance and Sexual Activity  . Alcohol use: No    Alcohol/week: 0.0 standard drinks    Comment: zero  . Drug use: No    Types: Barbituates, Benzodiazepines    Comment: Denies any drug use other than benzos  . Sexual activity: Never    Birth control/protection: Abstinence  Other Topics Concern  . Not on file  Social History Narrative   Patient lives at home alone.   Caffeine Use:  2 cokes daily   Sister lives next door.   Social Determinants of Health   Financial Resource Strain:   . Difficulty of Paying Living Expenses: Not on file  Food Insecurity:   . Worried About Charity fundraiser in the Last Year: Not on file  . Ran Out of Food in the  Last Year: Not on file  Transportation Needs:   . Lack of Transportation (Medical): Not on file  . Lack of Transportation (Non-Medical): Not on file  Physical Activity:   . Days of Exercise per Week: Not on file  . Minutes of Exercise per Session: Not on file  Stress:   . Feeling of Stress : Not on file  Social Connections:   . Frequency of Communication with Friends and Family: Not on file  . Frequency of Social Gatherings with Friends and Family: Not on file  . Attends Religious Services: Not on file  . Active Member of Clubs or Organizations: Not on file  . Attends Archivist Meetings: Not on file  . Marital Status: Not on file   Past Surgical History:  Procedure Laterality Date  . CESAREAN SECTION    . COSMETIC SURGERY    . ESOPHAGEAL MANOMETRY N/A  09/26/2016   Procedure: ESOPHAGEAL MANOMETRY (EM);  Surgeon: Clarene Essex, MD;  Location: [facility];  Service: Endoscopy;  Laterality: N/A;  . laproscopy     Past Medical History:  Diagnosis Date  . Anxiety   . Arthritis   . Depression   . Emotional depression 02/04/2015  . GERD (gastroesophageal reflux disease)   . High cholesterol   . Insomnia   . Memory loss    BP (!) 144/85   Pulse (!) 110   Temp (!) 97.5 F (36.4 C)   Ht 5\' 4"  (1.626 m)   Wt 200 lb (90.7 kg)   SpO2 95%   BMI 34.33 kg/m   Opioid Risk Score:   Fall Risk Score:  `1  Depression screen PHQ 2/9  No flowsheet data found.  Review of Systems  Constitutional: Positive for unexpected weight change.  HENT: Negative.   Eyes: Negative.   Respiratory: Negative.   Cardiovascular: Negative.   Gastrointestinal: Negative.   Endocrine:       High blood sugar  Genitourinary: Negative.   Musculoskeletal: Positive for back pain, neck pain and neck stiffness.  Skin: Negative.   Allergic/Immunologic: Negative.   Neurological: Negative.   Hematological: Bruises/bleeds easily.  Psychiatric/Behavioral: Negative.   All other systems reviewed and are negative.      Objective:   Physical Exam Gen: no distress, normal appearing HEENT: oral mucosa pink and moist, NCAT Cardio: Reg rate Chest: normal effort, normal rate of breathing Abd: soft, non-distended Ext: no edema Skin: intact Neuro: Aox3.  Musculoskeletal: 5/5 strength throughout except 4/5 on right hip abduction. Full flexion and extension, more difficulty with extension. Tenderness to palpation along bilateral SI joints.  Psych: pleasant, normal affect       Assessment & Plan:  Kendra Simmons is a 69 year-old woman who presents today for follow-up of her lower back pain and right sided hip pain.  I have personally reviewed most recent labs and orders in chart.   --XR of her lower back and lumbar spine to assess for degenerative disc disease and  any hip arthritis. Suspect that hip pain is more likely secondary to greater trochanteric pain syndrome given her exam, age, gender, and flexibility.  --Provided renewal of Tizanidine. Added Robaxin for prn use if she experiences spasms during the day.   --Patient states she was recently diagnosed with diabetes Type 2. I have personally reviewed her labs and provided dietary and exercise advice.   --Recommended use of Down Dog app to do 15 min of daily yoga with her daughter. Advised that this will help with both  hip and low back pain.  --Discussed family stressors extensively. Her anxiety and depressed mood given her family is a large component of increased inflammation and pain. Advised that she focus on the positive relationships in her life and the things that she enjoys to reduce her stress and grief.   RTC in 2 months to assess progress. I will call her beforehand with imaging results.

## 2020-01-27 ENCOUNTER — Ambulatory Visit
Admission: RE | Admit: 2020-01-27 | Discharge: 2020-01-27 | Disposition: A | Discharge status: Home / Self Care | Payer: Medicare Other | Source: Ambulatory Visit | Attending: Physical Medicine and Rehabilitation | Admitting: Physical Medicine and Rehabilitation

## 2020-01-27 DIAGNOSIS — M545 Low back pain, unspecified: Secondary | ICD-10-CM

## 2020-01-27 DIAGNOSIS — G8929 Other chronic pain: Secondary | ICD-10-CM

## 2020-01-29 ENCOUNTER — Telehealth: Payer: Self-pay | Admitting: Physical Medicine and Rehabilitation

## 2020-01-29 NOTE — Telephone Encounter (Signed)
Patient is calling about xrays that they had done on back and hip.  Would like to know if Dr. Ranell Patrick has had a chance to review them.

## 2020-01-29 NOTE — Telephone Encounter (Signed)
I have called patient to discuss but she has not answered phone and mailbox is full. I just tried calling again now with same result. If patient calls again you can provide her with my cell phone [phone] to reach me. Thanks so much!

## 2020-02-01 ENCOUNTER — Telehealth: Payer: Self-pay

## 2020-02-01 ENCOUNTER — Telehealth: Payer: Self-pay | Admitting: Physical Medicine and Rehabilitation

## 2020-02-01 NOTE — Telephone Encounter (Signed)
Patient contacted. Provided number per Dr. Ranell Patrick.   Dr  Ranell Patrick texted me and explained that she communicated x-ray results to patient

## 2020-02-01 NOTE — Telephone Encounter (Signed)
Attempted to call patient back for 3rd time to discuss her XR results but unfortunately her voicemail box is full and I am unable to leave a message for her. If she calls again please feel free to give her my cell phone number [phone]. Thanks so much.

## 2020-02-01 NOTE — Telephone Encounter (Signed)
Hi Lisa, I have called her 3 times without response and she has no voicemail set up. Can you provide her with my cell # if she calls again? [phone]. Thank you!

## 2020-02-01 NOTE — Telephone Encounter (Signed)
Ptn called and wants to know results of her xrays last week on hip and spine - [phone]

## 2020-02-06 ENCOUNTER — Other Ambulatory Visit (HOSPITAL_COMMUNITY): Payer: Self-pay | Admitting: Psychiatry

## 2020-02-06 DIAGNOSIS — F330 Major depressive disorder, recurrent, mild: Secondary | ICD-10-CM

## 2020-02-12 ENCOUNTER — Other Ambulatory Visit: Payer: Self-pay

## 2020-02-12 ENCOUNTER — Encounter: Payer: Medicare Other | Admitting: Physical Medicine and Rehabilitation

## 2020-02-12 ENCOUNTER — Encounter: Payer: Self-pay | Admitting: Physical Medicine and Rehabilitation

## 2020-02-12 VITALS — BP 118/76 | HR 85 | Temp 97.7°F | Ht 64.0 in | Wt 197.8 lb

## 2020-02-12 DIAGNOSIS — M25551 Pain in right hip: Secondary | ICD-10-CM

## 2020-02-12 DIAGNOSIS — G8929 Other chronic pain: Secondary | ICD-10-CM

## 2020-02-12 DIAGNOSIS — M545 Low back pain: Secondary | ICD-10-CM

## 2020-02-12 MED ORDER — TIZANIDINE HCL 4 MG PO TABS: 4.0000 mg | ORAL_TABLET | Freq: Two times a day (BID) | ORAL | 0 refills | Status: DC | PRN

## 2020-02-12 MED ORDER — METHOCARBAMOL 500 MG PO TABS: 500.0000 mg | ORAL_TABLET | Freq: Four times a day (QID) | ORAL | 1 refills | Status: DC

## 2020-02-12 NOTE — Progress Notes (Signed)
Righter Trochanteric bursa injection With ultrasound guidance  Indication Trochanteric bursitis. Exam has tenderness over the greater trochanter of the hip. Pain has not responded to conservative care such as exercise therapy and oral medications. Pain interferes with sleep or with mobility Informed consent was obtained after describing risks and benefits of the procedure with the patient these include bleeding bruising and infection. Patient has signed written consent form. Patient placed in a lateral decubitus position with the affected hip superior. Point of maximal pain was palpated marked and prepped with Betadine and entered with a needle to bone contact using ultrasound guidance and sterile procedure. Needle slightly withdrawn then 62mL of triamcinolone with 4 cc 1% lidocaine were injected. Patient tolerated procedure well. Post procedure instructions given.

## 2020-02-17 ENCOUNTER — Encounter: Payer: Self-pay | Admitting: Physical Medicine and Rehabilitation

## 2020-02-17 ENCOUNTER — Other Ambulatory Visit: Payer: Self-pay

## 2020-02-17 ENCOUNTER — Telehealth: Payer: Self-pay | Admitting: *Deleted

## 2020-02-17 ENCOUNTER — Encounter
Payer: Medicare Other | Attending: Physical Medicine and Rehabilitation | Admitting: Physical Medicine and Rehabilitation

## 2020-02-17 VITALS — BP 137/86 | HR 98 | Temp 97.7°F | Ht 64.0 in | Wt 194.4 lb

## 2020-02-17 DIAGNOSIS — G8929 Other chronic pain: Secondary | ICD-10-CM | POA: Insufficient documentation

## 2020-02-17 DIAGNOSIS — T50905S Adverse effect of unspecified drugs, medicaments and biological substances, sequela: Secondary | ICD-10-CM | POA: Insufficient documentation

## 2020-02-17 DIAGNOSIS — M25551 Pain in right hip: Secondary | ICD-10-CM | POA: Insufficient documentation

## 2020-02-17 DIAGNOSIS — M545 Low back pain: Secondary | ICD-10-CM | POA: Insufficient documentation

## 2020-02-17 DIAGNOSIS — M7061 Trochanteric bursitis, right hip: Secondary | ICD-10-CM | POA: Diagnosis not present

## 2020-02-17 DIAGNOSIS — M7062 Trochanteric bursitis, left hip: Secondary | ICD-10-CM | POA: Diagnosis not present

## 2020-02-17 NOTE — Progress Notes (Signed)
Left-sided trochanteric bursa injection With ultrasound guidance  Indication Trochanteric bursitis. Exam has tenderness over the greater trochanter of the left hip. Pain has not responded to conservative care such as exercise therapy and oral medications. Pain interferes with sleep or with mobility Informed consent was obtained after describing risks and benefits of the procedure with the patient these include bleeding bruising and infection. Patient has signed written consent form. Patient placed in a lateral decubitus position with the affected hip superior. Point of maximal pain was palpated marked and prepped with Betadine and entered with a needle to bone contact. Needle slightly withdrawn then 6mg  of betamethasone with 4 cc 1% lidocaine were injected. Patient tolerated procedure well. Post procedure instructions given.

## 2020-02-17 NOTE — Telephone Encounter (Signed)
Patient seen by Dr. Ranell Patrick today (02/17/2020)

## 2020-02-17 NOTE — Telephone Encounter (Signed)
Patient left a message stating that she has been trying to get through to [place].  She states that now her left hip is hurting.  She had had a cortisone injection in her right hip.

## 2020-02-19 ENCOUNTER — Ambulatory Visit (HOSPITAL_COMMUNITY)
Admission: RE | Admit: 2020-02-19 | Discharge: 2020-02-19 | Disposition: A | Discharge status: Home / Self Care | Payer: Medicare Other | Source: Ambulatory Visit | Attending: Pulmonary Disease | Admitting: Pulmonary Disease

## 2020-02-19 ENCOUNTER — Encounter (HOSPITAL_COMMUNITY): Payer: Self-pay

## 2020-02-19 ENCOUNTER — Other Ambulatory Visit (HOSPITAL_COMMUNITY): Payer: Self-pay

## 2020-02-19 ENCOUNTER — Other Ambulatory Visit: Payer: Self-pay | Admitting: Internal Medicine

## 2020-02-19 DIAGNOSIS — Z23 Encounter for immunization: Secondary | ICD-10-CM | POA: Diagnosis present

## 2020-02-19 DIAGNOSIS — U071 COVID-19: Secondary | ICD-10-CM

## 2020-02-19 MED ORDER — SODIUM CHLORIDE 0.9 % IV SOLN
700.0000 mg | Freq: Once | INTRAVENOUS | Status: AC
  Administered 2020-02-19: 700 mg via INTRAVENOUS
  Filled 2020-02-19: qty 20

## 2020-02-19 MED ORDER — SODIUM CHLORIDE 0.9 % IV SOLN
INTRAVENOUS | Status: DC | PRN
  Administered 2020-02-19: 250 mL via INTRAVENOUS

## 2020-02-19 MED ORDER — EPINEPHRINE 0.3 MG/0.3ML IJ SOAJ: 0.3000 mg | Freq: Once | INTRAMUSCULAR | Status: DC | PRN

## 2020-02-19 MED ORDER — DIPHENHYDRAMINE HCL 50 MG/ML IJ SOLN: 50.0000 mg | Freq: Once | INTRAMUSCULAR | Status: DC | PRN

## 2020-02-19 MED ORDER — FAMOTIDINE IN NACL 20-0.9 MG/50ML-% IV SOLN: 20.0000 mg | Freq: Once | INTRAVENOUS | Status: DC | PRN

## 2020-02-19 MED ORDER — METHYLPREDNISOLONE SODIUM SUCC 125 MG IJ SOLR: 125.0000 mg | Freq: Once | INTRAMUSCULAR | Status: DC | PRN

## 2020-02-19 MED ORDER — ALBUTEROL SULFATE HFA 108 (90 BASE) MCG/ACT IN AERS: 2.0000 | INHALATION_SPRAY | Freq: Once | RESPIRATORY_TRACT | Status: DC | PRN

## 2020-02-19 NOTE — Progress Notes (Signed)
  I connected by phone with Kendra Simmons on 02/19/2020 at 8:22 AM to discuss the potential use of an new treatment for mild to moderate COVID-19 viral infection in non-hospitalized patients.  This patient is a 69 y.o. female that meets the FDA criteria for Emergency Use Authorization of bamlanivimab or casirivimab\imdevimab.  Has a (+) direct SARS-CoV-2 viral test result  Has mild or moderate COVID-19   Is ? 69 years of age and weighs ? 40 kg  Is NOT hospitalized due to COVID-19  Is NOT requiring oxygen therapy or requiring an increase in baseline oxygen flow rate due to COVID-19  Is within 10 days of symptom onset  Has at least one of the high risk factor(s) for progression to severe COVID-19 and/or hospitalization as defined in EUA.  Specific high risk criteria : >/= 69 yo   I have spoken and communicated the following to the patient or parent/caregiver:  1. FDA has authorized the emergency use of bamlanivimab and casirivimab\imdevimab for the treatment of mild to moderate COVID-19 in adults and pediatric patients with positive results of direct SARS-CoV-2 viral testing who are 41 years of age and older weighing at least 40 kg, and who are at high risk for progressing to severe COVID-19 and/or hospitalization.  2. The significant known and potential risks and benefits of bamlanivimab and casirivimab\imdevimab, and the extent to which such potential risks and benefits are unknown.  3. Information on available alternative treatments and the risks and benefits of those alternatives, including clinical trials.  4. Patients treated with bamlanivimab and casirivimab\imdevimab should continue to self-isolate and use infection control measures (e.g., wear mask, isolate, social distance, avoid sharing personal items, clean and disinfect "high touch" surfaces, and frequent handwashing) according to CDC guidelines.   5. The patient or parent/caregiver has the option to accept or refuse  bamlanivimab or casirivimab\imdevimab .  After reviewing this information with the patient, The patient agreed to proceed with receiving the bamlanimivab infusion and will be provided a copy of the Fact sheet prior to receiving the infusion.   Infusion scheduled for 3/5 at 1230. Day 3 of symptoms.   Alan Ripper, NP-C [place]

## 2020-02-19 NOTE — Discharge Instructions (Signed)

## 2020-02-19 NOTE — Progress Notes (Signed)
  Diagnosis: COVID-19  Physician: Dr. Joya Gaskins  Procedure: [facility] Med: bamlanivimab infusion - Provided patient with bamlanimivab fact sheet for patients, parents and caregivers prior to infusion.  Complications: No immediate complications noted.  Discharge: Discharged home   Acquanetta Chain 02/19/2020

## 2020-02-24 ENCOUNTER — Encounter (HOSPITAL_COMMUNITY): Payer: Self-pay | Admitting: Emergency Medicine

## 2020-02-24 ENCOUNTER — Other Ambulatory Visit: Payer: Self-pay

## 2020-02-24 ENCOUNTER — Emergency Department (HOSPITAL_COMMUNITY)
Admission: EM | Admit: 2020-02-24 | Discharge: 2020-02-24 | Disposition: A | Discharge status: Home / Self Care | Payer: Medicare Other | Attending: Emergency Medicine | Admitting: Emergency Medicine

## 2020-02-24 ENCOUNTER — Emergency Department (HOSPITAL_COMMUNITY): Payer: Medicare Other

## 2020-02-24 DIAGNOSIS — R0602 Shortness of breath: Secondary | ICD-10-CM | POA: Diagnosis present

## 2020-02-24 DIAGNOSIS — U071 COVID-19: Secondary | ICD-10-CM | POA: Insufficient documentation

## 2020-02-24 DIAGNOSIS — R197 Diarrhea, unspecified: Secondary | ICD-10-CM | POA: Diagnosis not present

## 2020-02-24 DIAGNOSIS — R519 Headache, unspecified: Secondary | ICD-10-CM | POA: Diagnosis not present

## 2020-02-24 DIAGNOSIS — Z79899 Other long term (current) drug therapy: Secondary | ICD-10-CM | POA: Diagnosis not present

## 2020-02-24 DIAGNOSIS — R0981 Nasal congestion: Secondary | ICD-10-CM | POA: Diagnosis not present

## 2020-02-24 DIAGNOSIS — R05 Cough: Secondary | ICD-10-CM | POA: Insufficient documentation

## 2020-02-24 LAB — CBC
HCT: 41.3 % (ref 36.0–46.0)
Hemoglobin: 13.2 g/dL (ref 12.0–15.0)
MCH: 31.2 pg (ref 26.0–34.0)
MCHC: 32 g/dL (ref 30.0–36.0)
MCV: 97.6 fL (ref 80.0–100.0)
Platelets: 148 10*3/uL — ABNORMAL LOW (ref 150–400)
RBC: 4.23 MIL/uL (ref 3.87–5.11)
RDW: 12.7 % (ref 11.5–15.5)
WBC: 6.9 10*3/uL (ref 4.0–10.5)
nRBC: 0 % (ref 0.0–0.2)

## 2020-02-24 LAB — BASIC METABOLIC PANEL
Anion gap: 13 (ref 5–15)
BUN: 22 mg/dL (ref 8–23)
CO2: 22 mmol/L (ref 22–32)
Calcium: 9.8 mg/dL (ref 8.9–10.3)
Chloride: 107 mmol/L (ref 98–111)
Creatinine, Ser: 0.84 mg/dL (ref 0.44–1.00)
GFR calc Af Amer: >60 mL/min (ref >60)
GFR calc non Af Amer: >60 mL/min (ref >60)
Glucose, Bld: 148 mg/dL — ABNORMAL HIGH (ref 70–99)
Potassium: 4.1 mmol/L (ref 3.5–5.1)
Sodium: 142 mmol/L (ref 135–145)

## 2020-02-24 LAB — CBG MONITORING, ED: Glucose-Capillary: 143 mg/dL — ABNORMAL HIGH (ref 70–99)

## 2020-02-24 MED ORDER — ONDANSETRON 4 MG PO TBDP: 4.0000 mg | ORAL_TABLET | Freq: Three times a day (TID) | ORAL | 0 refills | Status: DC | PRN

## 2020-02-24 MED ORDER — ALBUTEROL SULFATE HFA 108 (90 BASE) MCG/ACT IN AERS: 1.0000 | INHALATION_SPRAY | Freq: Four times a day (QID) | RESPIRATORY_TRACT | 0 refills | Status: DC | PRN

## 2020-02-24 MED ORDER — DEXAMETHASONE SODIUM PHOSPHATE 10 MG/ML IJ SOLN
6.0000 mg | Freq: Once | INTRAMUSCULAR | Status: AC
  Administered 2020-02-24: 6 mg via INTRAVENOUS
  Filled 2020-02-24: qty 1

## 2020-02-24 MED ORDER — METOCLOPRAMIDE HCL 5 MG/ML IJ SOLN
10.0000 mg | Freq: Once | INTRAMUSCULAR | Status: AC
  Administered 2020-02-24: 10 mg via INTRAVENOUS
  Filled 2020-02-24: qty 2

## 2020-02-24 MED ORDER — SODIUM CHLORIDE 0.9 % IV BOLUS
1000.0000 mL | Freq: Once | INTRAVENOUS | Status: AC
  Administered 2020-02-24: 1000 mL via INTRAVENOUS

## 2020-02-24 MED ORDER — FLUTICASONE PROPIONATE 50 MCG/ACT NA SUSP: 2.0000 | Freq: Every day | NASAL | 0 refills | Status: DC

## 2020-02-24 NOTE — ED Provider Notes (Signed)
[facility] Provider Note   CSN: [id number] Arrival date & time: 02/24/20  1204     History No chief complaint on file.   Kendra Simmons is a 69 y.o. female.  HPI      Kendra Simmons is a 69 y.o. female, with a history of anxiety, GERD, hypercholesterolemia, presenting to the ED with cough and shortness of breath beginning around March 1.  Also complains of nasal congestion, headache, nausea, intermittent diarrhea, and fatigue. She tested positive for Covid March 4 and underwent bamlanivimab infusion Friday, March 5.  She comes to the ED because she does not feel any better.  Denies fever, chest pain, dizziness, syncope, abdominal pain, urinary symptoms, or any other complaints.   Past Medical History:  Diagnosis Date  . Anxiety   . Arthritis   . Depression   . Emotional depression 02/04/2015  . GERD (gastroesophageal reflux disease)   . High cholesterol   . Insomnia   . Memory loss     Patient Active Problem List   Diagnosis Date Noted  . Sepsis without acute organ dysfunction ([place])   . Regurgitation of stomach contents   . Esophageal dysphagia   . Community acquired pneumonia of right upper lobe of lung 12/08/2018  . Chronic bilateral low back pain 07/01/2017  . Dehydration 07/03/2015  . UTI (lower urinary tract infection) 07/03/2015  . Hypokalemia 07/03/2015  . AKI (acute kidney injury) ([place]) 07/03/2015  . Tachycardia 05/27/2015  . MDD (major depressive disorder), recurrent, severe, with psychosis ([place]) 05/25/2015  . 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 Insomnia disorder, with non-sleep disorder mental comorbidity, 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  . 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 Snoring 12/24/2014  . Unintended weight loss 12/24/2014  . 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 Panic attacks 07/26/2014  . Thrombocytopenia ([place]) 12/17/2012  . DYSLIPIDEMIA 03/13/2010  . GERD 03/13/2010    Past Surgical  History:  Procedure Laterality Date  . CESAREAN SECTION    . COSMETIC SURGERY    . ESOPHAGEAL MANOMETRY N/A 09/26/2016   Procedure: ESOPHAGEAL MANOMETRY (EM);  Surgeon: Clarene Essex, MD;  Location: [facility];  Service: Endoscopy;  Laterality: N/A;  . laproscopy       OB History   No obstetric history on file.     Family History  Problem Relation Age of Onset  . Sleep apnea Father   . Alcohol abuse Father   . Diabetes Mother   . Diabetes Sister   . Diabetes Maternal Uncle   . Diabetes Cousin     Social History   Tobacco Use  . Smoking status: Never Smoker  . Smokeless tobacco: Never Used  Substance Use Topics  . Alcohol use: No    Alcohol/week: 0.0 standard drinks    Comment: zero  . Drug use: No    Types: Barbituates, Benzodiazepines    Comment: Denies any drug use other than benzos    Home Medications Prior to Admission medications   Medication Sig Start Date End Date Taking? Authorizing Provider  acetaminophen (TYLENOL) 500 MG tablet Take 500 mg by mouth every 4 (four) hours as needed for mild pain.   Yes [provider]  buPROPion (WELLBUTRIN XL) 150 MG 24 hr tablet TAKE 1 TABLET BY MOUTH EVERY DAY IN THE MORNING 12/24/19  Yes Arfeen, Arlyce Harman, MD  Calcium Carbonate (CALCIUM 600 PO) Take 1 tablet by mouth daily.   Yes [provider]  clonazePAM (KLONOPIN) 0.5 MG tablet Take 1 tablet (0.5  mg total) by mouth daily as needed for anxiety. 12/24/19  Yes Arfeen, Arlyce Harman, MD  diclofenac Sodium (VOLTAREN) 1 % GEL Apply 2 g topically 4 (four) times daily. 01/21/20  Yes Mcarthur Rossetti, MD  doxepin (SINEQUAN) 75 MG capsule Take 1 capsule (75 mg total) by mouth at bedtime. 12/24/19  Yes Arfeen, Arlyce Harman, MD  ibuprofen (ADVIL,MOTRIN) 200 MG tablet Take 400 mg by mouth every 4 (four) hours as needed for moderate pain.    Yes [provider]  meloxicam (MOBIC) 15 MG tablet TAKE 1 TABLET BY MOUTH EVERY DAY AS NEEDED FOR PAIN Patient taking differently:  Take 15 mg by mouth daily as needed.  08/10/19  Yes Mcarthur Rossetti, MD  memantine (NAMENDA) 10 MG tablet Take 1 tablet (10 mg total) by mouth 2 (two) times daily. 06/02/15  Yes Rankin, Shuvon B, NP  methocarbamol (ROBAXIN) 500 MG tablet Take 1 tablet (500 mg total) by mouth 4 (four) times daily. Patient taking differently: Take 500 mg by mouth every 6 (six) hours as needed for muscle spasms.  02/12/20  Yes Raulkar, Clide Deutscher, MD  Misc. Devices (WALKER GLIDE WHEELS) MISC Use walker daily to remain non-weight-bearing 05/09/18  Yes Wurst, [place], PA-C  Multiple Vitamins-Minerals (MULTIVITAMIN PO) Take 1 tablet by mouth daily.   Yes [provider]  omeprazole (PRILOSEC) 40 MG capsule Take 40 mg by mouth daily.  09/06/16  Yes [provider]  pravastatin (PRAVACHOL) 40 MG tablet Take 40 mg by mouth every evening. 08/05/19  Yes [provider]  pregabalin (LYRICA) 50 MG capsule Take 1 capsule (50 mg total) by mouth at bedtime. 12/24/19 12/23/20 Yes Arfeen, Arlyce Harman, MD  Probiotic Product (PROBIOTIC PO) Take 1 tablet by mouth daily.   Yes [provider]  QUEtiapine (SEROQUEL) 400 MG tablet Take 1 tablet (400 mg total) by mouth at bedtime. 12/24/19  Yes Arfeen, Arlyce Harman, MD  ramelteon (ROZEREM) 8 MG tablet Take 1 tablet (8 mg total) by mouth at bedtime. 12/24/19  Yes Arfeen, Arlyce Harman, MD  sucralfate (CARAFATE) 1 GM/10ML suspension Take 10 mLs (1 g total) by mouth 4 (four) times daily -  with meals and at bedtime. 12/11/18  Yes Black, Lezlie Octave, NP  tamsulosin (FLOMAX) 0.4 MG CAPS capsule Take 1 capsule (0.4 mg total) by mouth daily after supper. For frequent urination/urgency 06/02/15  Yes Rankin, Shuvon B, NP  tiZANidine (ZANAFLEX) 4 MG tablet Take 1 tablet (4 mg total) by mouth 2 (two) times daily as needed for muscle spasms. Patient taking differently: Take 8 mg by mouth at bedtime.  02/12/20  Yes Raulkar, Clide Deutscher, MD  albuterol (VENTOLIN HFA) 108 (90 Base) MCG/ACT inhaler  Inhale 1-2 puffs into the lungs every 6 (six) hours as needed for wheezing or shortness of breath. 02/24/20   Scarlettrose Costilow C, PA-C  fluticasone (FLONASE) 50 MCG/ACT nasal spray Place 2 sprays into both nostrils daily. 02/24/20   Dennys Traughber, Helane Gunther, PA-C  linaclotide (LINZESS) 72 MCG capsule Take 1 capsule (72 mcg total) by mouth every other day. Patient not taking: Reported on 02/24/2020 12/12/18   Radene Gunning, NP  ondansetron (ZOFRAN ODT) 4 MG disintegrating tablet Take 1 tablet (4 mg total) by mouth every 8 (eight) hours as needed for nausea or vomiting. 02/24/20   Ashante Snelling C, PA-C    Allergies    Phentermine and Augmentin [amoxicillin-pot clavulanate]  Review of Systems   Review of Systems  Constitutional: Negative for chills, diaphoresis  and fever.  HENT: Positive for congestion and sore throat. Negative for trouble swallowing and voice change.   Respiratory: Positive for cough and shortness of breath.   Cardiovascular: Negative for chest pain.  Gastrointestinal: Positive for diarrhea and nausea. Negative for abdominal pain, blood in stool and vomiting.  All other systems reviewed and are negative.   Physical Exam Updated Vital Signs BP (!) 145/79   Pulse 90   Temp 97.8 F (36.6 C) (Oral)   Resp 16   Ht 5\' 4"  (1.626 m)   Wt 88.2 kg   SpO2 99%   BMI 33.37 kg/m   Physical Exam Vitals and nursing note reviewed.  Constitutional:      General: She is not in acute distress.    Appearance: She is well-developed. She is not diaphoretic.  HENT:     Head: Normocephalic and atraumatic.     Nose: Mucosal edema and congestion present.     Mouth/Throat:     Mouth: Mucous membranes are moist.     Pharynx: Oropharynx is clear.  Eyes:     Conjunctiva/sclera: Conjunctivae normal.  Cardiovascular:     Rate and Rhythm: Normal rate and regular rhythm.     Pulses: Normal pulses.          Radial pulses are 2+ on the right side and 2+ on the left side.       Posterior tibial pulses are 2+ on  the right side and 2+ on the left side.     Heart sounds: Normal heart sounds.     Comments: Tactile temperature in the extremities appropriate and equal bilaterally. Pulmonary:     Effort: Pulmonary effort is normal. No respiratory distress.     Breath sounds: Normal breath sounds.     Comments: No increased work of breathing.  Speaks in full sentences without difficulty. Abdominal:     Palpations: Abdomen is soft.     Tenderness: There is no abdominal tenderness. There is no guarding.  Musculoskeletal:     Cervical back: Neck supple.     Right lower leg: No edema.     Left lower leg: No edema.  Lymphadenopathy:     Cervical: No cervical adenopathy.  Skin:    General: Skin is warm and dry.  Neurological:     Mental Status: She is alert.     Comments: No noted acute cognitive deficit. Sensation grossly intact to light touch in the extremities.   Grip strengths equal bilaterally.   Strength 5/5 in all extremities.  No gait disturbance.  Coordination intact.  Cranial nerves III-XII grossly intact.  Handles oral secretions without noted difficulty.  No noted phonation or speech deficit. No facial droop.   Psychiatric:        Mood and Affect: Mood and affect normal.        Speech: Speech normal.        Behavior: Behavior normal.     ED Results / Procedures / Treatments   Labs (all labs ordered are listed, but only abnormal results are displayed) Labs Reviewed  BASIC METABOLIC PANEL - Abnormal; Notable for the following components:      Result Value   Glucose, Bld 148 (*)    All other components within normal limits  CBC - Abnormal; Notable for the following components:   Platelets 148 (*)    All other components within normal limits  CBG MONITORING, ED - Abnormal; Notable for the following components:   Glucose-Capillary 143 (*)  All other components within normal limits  URINALYSIS, ROUTINE W REFLEX MICROSCOPIC    EKG EKG Interpretation  Date/Time:  Wednesday  February 24 2020 16:31:42 EST Ventricular Rate:  83 PR Interval:  148 QRS Duration: 82 QT Interval:  386 QTC Calculation: 453 R Axis:   -4 Text Interpretation: Normal sinus rhythm Minimal voltage criteria for LVH, may be normal variant ( R in aVL ) similar to penultimate ECG T wave abnormality Abnormal ECG Confirmed by Carmin Muskrat [phone]) on 02/24/2020 6:53:54 PM   Radiology DG Chest Port 1 View  Result Date: 02/24/2020 CLINICAL DATA:  Cough. COVID positive. EXAM: PORTABLE CHEST 1 VIEW COMPARISON:  Radiograph 01/30/2019 FINDINGS: The cardiomediastinal contours are normal. Minimal streaky opacities at the lung bases. Pulmonary vasculature is normal. No consolidation, pleural effusion, or pneumothorax. No acute osseous abnormalities are seen. IMPRESSION: Minimal streaky opacities at the lung bases, favoring atelectasis. Electronically Signed   By: Keith Rake M.D.   On: 02/24/2020 15:56    Procedures Procedures (including critical care time)  Medications Ordered in ED Medications  sodium chloride 0.9 % bolus 1,000 mL (0 mLs Intravenous Stopped 02/24/20 1800)  dexamethasone (DECADRON) injection 6 mg (6 mg Intravenous Given 02/24/20 1559)  metoCLOPramide (REGLAN) injection 10 mg (10 mg Intravenous Given 02/24/20 1559)    ED Course  I have reviewed the triage vital signs and the nursing notes.  Pertinent labs & imaging results that were available during my care of the patient were reviewed by me and considered in my medical decision making (see chart for details).  Clinical Course as of Feb 23 1906  Wed Feb 24, 2020  1547 Previously 107 about a year ago.  Platelets(!): 148 [SJ]  1800 Patient states she is feeling better.   [SJ]    Clinical Course User Index [SJ] Zykeria Laguardia, Helane Gunther, PA-C   MDM Rules/Calculators/A&P                      Patient presents with symptoms consistent with COVID-19 infection. Patient is nontoxic appearing, afebrile, not tachycardic, not tachypneic, not  hypotensive, maintains excellent SPO2 on room air, and is in no apparent distress.   I have reviewed the patient's chart to obtain more information.  I reviewed and interpreted the patient's labs and radiological studies. Findings overall reassuring. The patient was given instructions for home care as well as return precautions. Patient voices understanding of these instructions, accepts the plan, and is comfortable with discharge.   Findings and plan of care discussed with Adrian Prows, MD. Dr. Vanita Panda personally evaluated and examined this patient.  Masiya Babicz was evaluated in Emergency Department on 02/24/2020 for the symptoms described in the history of present illness. She was evaluated in the context of the global COVID-19 pandemic, which necessitated consideration that the patient might be at risk for infection with the SARS-CoV-2 virus that causes COVID-19. Institutional protocols and algorithms that pertain to the evaluation of patients at risk for COVID-19 are in a state of rapid change based on information released by regulatory bodies including the CDC and federal and state organizations. These policies and algorithms were followed during the patient's care in the ED.  Vitals:   02/24/20 1530 02/24/20 1600 02/24/20 1630 02/24/20 1827  BP: (!) 145/79 (!) 143/72 (!) 151/73 (!) 158/81  Pulse: 90 83 84 85  Resp: 16 15 14 17   Temp:      TempSrc:      SpO2: 99% 97% 99% 99%  Weight:      Height:         Final Clinical Impression(s) / ED Diagnoses Final diagnoses:  COVID-19    Rx / DC Orders ED Discharge Orders         Ordered    ondansetron (ZOFRAN ODT) 4 MG disintegrating tablet  Every 8 hours PRN     02/24/20 1802    fluticasone (FLONASE) 50 MCG/ACT nasal spray  Daily     02/24/20 1802    albuterol (VENTOLIN HFA) 108 (90 Base) MCG/ACT inhaler  Every 6 hours PRN     02/24/20 1802           Lorayne Bender, PA-C 02/24/20 1907    Carmin Muskrat,  MD 02/25/20 0028

## 2020-02-24 NOTE — Discharge Instructions (Addendum)
COVID-19 care Instructions:  Your symptoms are likely consistent with COVID-19 infection, which is a viral illness. Viruses do not require or respond to antibiotics. Treatment is symptomatic care and it is important to note that these symptoms may last for 7-14 days.   Hand washing: Wash your hands throughout the day, but especially before and after touching the face, using the restroom, sneezing, coughing, or touching surfaces that have been coughed or sneezed upon. Hydration: Symptoms of most illnesses will be intensified and complicated by dehydration. Dehydration can also extend the duration of symptoms. Drink plenty of fluids and get plenty of rest. You should be drinking at least half a liter of water an hour to stay hydrated. Electrolyte drinks (ex. Gatorade, Powerade, Pedialyte) are also encouraged. You should be drinking enough fluids to make your urine light yellow, almost clear. If this is not the case, you are not drinking enough water. Please note that some of the treatments indicated below will not be effective if you are not adequately hydrated. Diet: Please concentrate on hydration, however, you may introduce food slowly.  Start with a clear liquid diet, progressed to a full liquid diet, and then bland solids as you are able. Pain or fever: Ibuprofen, Naproxen, or acetaminophen (generic for Tylenol) for pain or fever.  Take 400-600 mg of ibuprofen every 6 hours for the next 3 days. After this time, this medication may be used as needed for pain. Take this medication with food to avoid upset stomach. Acetaminophen (generic for Tylenol): Should you continue to have additional pain while taking the ibuprofen or naproxen, you may add in acetaminophen as needed. Your daily total maximum amount of acetaminophen from all sources should be limited to 4000mg /day for persons without liver problems, or 2000mg /day for those with liver problems. Nausea/vomiting: Use the ondansetron (generic for Zofran)  for nausea or vomiting.  This medication may not prevent all vomiting or nausea, but can help facilitate better hydration. Things that can help with nausea/vomiting also include peppermint/menthol candies, vitamin B12, and ginger. Diarrhea: May use medications such as loperamide (Imodium) or Bismuth subsalicylate (Pepto-Bismol). Cough: Use the benzonatate (generic for Tessalon) for cough.  Teas, warm liquids, broths, and honey can also help with cough. Albuterol: May use the albuterol as needed for instances of shortness of breath. Zyrtec or Claritin: May add these medication daily to control underlying symptoms of congestion, sneezing, and other signs of allergies.  These medications are available over-the-counter. Generics: Cetirizine (generic for Zyrtec) and loratadine (generic for Claritin). Fluticasone: Use fluticasone (generic for Flonase), as directed, for nasal and sinus congestion.  This medication is available over-the-counter. Congestion: Plain guaifenesin (generic for plain Mucinex) may help relieve congestion. Saline sinus rinses and saline nasal sprays may also help relieve congestion.  Sore throat: Warm liquids or Chloraseptic spray may help soothe a sore throat. Gargle twice a day with a salt water solution made from a half teaspoon of salt in a cup of warm water.  Follow up: Follow up with a primary care provider within the next two weeks should symptoms fail to resolve. Return: Return to the ED for significantly worsening symptoms, shortness of breath, persistent vomiting, large amounts of blood in stool, or any other major concerns.  For prescription assistance, may try using prescription discount sites or apps, such as goodrx.com

## 2020-02-24 NOTE — ED Triage Notes (Signed)
Pt reports Covid + last Thursday and had an infusion Friday but is not feeling any better. Endorses HA, SOB and worried about a sinus infection.

## 2020-02-29 ENCOUNTER — Other Ambulatory Visit: Payer: Self-pay | Admitting: Physical Medicine and Rehabilitation

## 2020-03-02 ENCOUNTER — Ambulatory Visit: Payer: Medicare Other

## 2020-03-09 ENCOUNTER — Other Ambulatory Visit: Payer: Self-pay

## 2020-03-09 ENCOUNTER — Encounter: Payer: Self-pay | Admitting: Dietician

## 2020-03-09 ENCOUNTER — Other Ambulatory Visit (HOSPITAL_COMMUNITY): Payer: Self-pay | Admitting: Psychiatry

## 2020-03-09 ENCOUNTER — Encounter: Payer: Medicare Other | Attending: Family Medicine | Admitting: Dietician

## 2020-03-09 DIAGNOSIS — F330 Major depressive disorder, recurrent, mild: Secondary | ICD-10-CM

## 2020-03-09 DIAGNOSIS — E119 Type 2 diabetes mellitus without complications: Secondary | ICD-10-CM

## 2020-03-09 NOTE — Patient Instructions (Addendum)
Aim to get 150 minutes of activity per week. For example, this could be 30 minutes per day on 5 days per week.   Walking   Chair exercises   Try working on eating smaller portions of food at each meal and snack. Start paying attention to the foods that your eating and identify which ones have carbohydrates in them (use your handouts to help.)

## 2020-03-09 NOTE — Progress Notes (Signed)
Diabetes Self-Management Education  Visit Type: First/Initial  03/09/2020  [name], identified by name and date of birth, is a 69 y.o. female with a diagnosis of Diabetes: Type 2.   ASSESSMENT  Weight 191 lb (86.6 kg). Body mass index is 32.79 kg/m.  Diabetes Self-Management Education - 03/09/20 1412      Visit Information   Visit Type  First/Initial      Initial Visit   Diabetes Type  Type 2    Are you currently following a meal plan?  No    Are you taking your medications as prescribed?  Yes    Date Diagnosed  January 19, 2020      Health Coping   How would you rate your overall health?  Good      Psychosocial Assessment   Patient Belief/Attitude about Diabetes  Motivated to manage diabetes    Self-care barriers  None    Self-management support  Doctor's office;Family    Other persons present  Patient    Patient Concerns  Nutrition/Meal planning;Weight Control    Special Needs  None    Preferred Learning Style  No preference indicated    Learning Readiness  Ready    How often do you need to have someone help you when you read instructions, pamphlets, or other written materials from your doctor or pharmacy?  1 - Never    What is the last grade level you completed in school?  some college      Complications   Last HgB A1C per patient/outside source  6.5 %    How often do you check your blood sugar?  0 times/day (not testing)    Have you had a dilated eye exam in the past 12 months?  No    Have you had a dental exam in the past 12 months?  No    Are you checking your feet?  No      Dietary Intake   Breakfast  1 cup 1% chocolate milk   or protein shake, or 1 slice whole wheat toast with butter   Snack (morning)  nuts    Lunch  usually skips    Snack (afternoon)  banana   or apple, or whole grain peanut butter crackers   Dinner  casserole (whole grain pasta, rice, mushrooms, chicken, mozzarella cheese)   or wild rice with broccoli/peas/corn/cauliflower  and sometimes meat, or salad with grilled chicken, or Lean Cuisine   Beverage(s)  sugar-free lemonade, Sprite w/ Crystal Light, 1% chocolate milk, Premier Protein shakes      Exercise   Exercise Type  ADL's;Light (walking / raking leaves)    How many days per week to you exercise?  2    How many minutes per day do you exercise?  30    Total minutes per week of exercise  60      Patient Education   Previous Diabetes Education  No    Disease state   Definition of diabetes, type 1 and 2, and the diagnosis of diabetes;Explored patient's options for treatment of their diabetes;Factors that contribute to the development of diabetes    Nutrition management   Meal options for control of blood glucose level and chronic complications.    Physical activity and exercise   Role of exercise on diabetes management, blood pressure control and cardiac health.;Helped patient identify appropriate exercises in relation to his/her diabetes, diabetes complications and other health issue.    Monitoring  Interpreting lab values - A1C,  lipid, urine microalbumina.;Identified appropriate SMBG and/or A1C goals.    Psychosocial adjustment  Role of stress on diabetes;Helped patient identify a support system for diabetes management;Identified and addressed patients feelings and concerns about diabetes      Individualized Goals (developed by patient)   Nutrition  Other (comment)   reduce portion sizes of carbohydrate foods   Physical Activity  Exercise 3-5 times per week;30 minutes per day    Medications  take my medication as prescribed      Outcomes   Expected Outcomes  Demonstrated interest in learning. Expect positive outcomes    Future DMSE  4-6 wks    Program Status  Not Completed       Individualized Plan for Diabetes Self-Management Training:   Learning Objective:  Patient will have a greater understanding of diabetes self-management. Patient education plan is to attend individual and/or group sessions per  assessed needs and concerns.   Plan:  Patient Instructions  Aim to get 150 minutes of activity per week. For example, this could be 30 minutes per day on 5 days per week.   Walking   Chair exercises   Try working on eating smaller portions of food at each meal and snack. Start paying attention to the foods that your eating and identify which ones have carbohydrates in them (use your handouts to help.)    Expected Outcomes:  Demonstrated interest in learning. Expect positive outcomes  Education material provided: ADA - How to Thrive: A Guide for Your Journey with Diabetes, A1C conversion sheet and My Plate, ADA 3 Types of Exercise  If problems or questions, patient to contact team via:  Phone and Email  Future DSME appointment: 4-6 wks

## 2020-03-10 ENCOUNTER — Ambulatory Visit
Admission: RE | Admit: 2020-03-10 | Discharge: 2020-03-10 | Disposition: A | Discharge status: Home / Self Care | Payer: Medicare Other | Source: Ambulatory Visit | Attending: Family Medicine | Admitting: Family Medicine

## 2020-03-10 ENCOUNTER — Other Ambulatory Visit: Payer: Self-pay

## 2020-03-10 DIAGNOSIS — Z1231 Encounter for screening mammogram for malignant neoplasm of breast: Secondary | ICD-10-CM

## 2020-03-16 NOTE — Telephone Encounter (Signed)
disregard

## 2020-03-20 ENCOUNTER — Other Ambulatory Visit (HOSPITAL_COMMUNITY): Payer: Self-pay | Admitting: Psychiatry

## 2020-03-20 DIAGNOSIS — F411 Generalized anxiety disorder: Secondary | ICD-10-CM

## 2020-03-20 DIAGNOSIS — F330 Major depressive disorder, recurrent, mild: Secondary | ICD-10-CM

## 2020-03-21 ENCOUNTER — Ambulatory Visit: Payer: Medicare Other | Admitting: Physical Medicine and Rehabilitation

## 2020-03-23 ENCOUNTER — Encounter (HOSPITAL_COMMUNITY): Payer: Self-pay | Admitting: Psychiatry

## 2020-03-23 ENCOUNTER — Ambulatory Visit (INDEPENDENT_AMBULATORY_CARE_PROVIDER_SITE_OTHER): Payer: Medicare Other | Admitting: Psychiatry

## 2020-03-23 ENCOUNTER — Other Ambulatory Visit: Payer: Self-pay

## 2020-03-23 DIAGNOSIS — F411 Generalized anxiety disorder: Secondary | ICD-10-CM

## 2020-03-23 DIAGNOSIS — F5101 Primary insomnia: Secondary | ICD-10-CM | POA: Diagnosis not present

## 2020-03-23 DIAGNOSIS — F330 Major depressive disorder, recurrent, mild: Secondary | ICD-10-CM

## 2020-03-23 MED ORDER — PREGABALIN 25 MG PO CAPS: 25.0000 mg | ORAL_CAPSULE | Freq: Every day | ORAL | 0 refills | Status: DC

## 2020-03-23 MED ORDER — RAMELTEON 8 MG PO TABS: 8.0000 mg | ORAL_TABLET | Freq: Every day | ORAL | 2 refills | Status: DC

## 2020-03-23 MED ORDER — CLONAZEPAM 0.5 MG PO TABS: 0.5000 mg | ORAL_TABLET | Freq: Every day | ORAL | 0 refills | Status: DC | PRN

## 2020-03-23 MED ORDER — BUPROPION HCL ER (XL) 150 MG PO TB24: ORAL_TABLET | ORAL | 0 refills | Status: DC

## 2020-03-23 MED ORDER — QUETIAPINE FUMARATE 400 MG PO TABS: 400.0000 mg | ORAL_TABLET | Freq: Every day | ORAL | 1 refills | Status: DC

## 2020-03-23 MED ORDER — DOXEPIN HCL 75 MG PO CAPS: 75.0000 mg | ORAL_CAPSULE | Freq: Every day | ORAL | 0 refills | Status: DC

## 2020-03-23 NOTE — Progress Notes (Signed)
Virtual Visit via Telephone Note  I connected with Kendra Simmons on 03/23/20 at 11:20 AM EDT by telephone and verified that I am speaking with the correct person using two identifiers.   I discussed the limitations, risks, security and privacy concerns of performing an evaluation and management service by telephone and the availability of in person appointments. I also discussed with the patient that there may be a patient responsible charge related to this service. The patient expressed understanding and agreed to proceed.   History of Present Illness: Patient was evaluated through phone session.  She recently had Covid symptoms when she was having cough, breathing issues and given antibiotic.  She is feeling much better now.  She has no more symptoms.  She started pain management and recently given tizanidine and methocarbamol.  She also had injections to help her pain by the pain management.  Overall she described her mood is stable but she still have sometimes pain.  She denies any irritability, anger, mania.  She like to visit her daughter who has special needs and live in a group home.  She told her it has been a while that she has not visited but now she is not sure if she can go since she had Covid.  She went to get Covid vaccine and hoping she can get it since he is symptom-free for more than a month.  Her sleep is okay.  She takes a lot of medication.  We had cut down her Lyrica on the last visit.  She also lost weight as recently diagnosed prediabetic with hemoglobin A1c 6.5.  She has no tremors, shakes or any EPS.  She denies any crying spells or any feeling of hopelessness.  She is pleased that her medicines are getting on time and able to find resources to fill the prescriptions.     Past Psychiatric History:Reviewed. H/Omultiple hospitalization.LastinpatientJune 2016. MultipleIOP. H/O hallucination, paranoiaanddepression. In the past seenAlan Watt, [name] and Dr.  Thurmond Butts. TriedXanax, amitriptyline, imipramine, temazepam, trazodone, Zyprexa, Luvox, Wellbutrin, Prozac, Vistaril, Remeronand Bellsomra.  Psychiatric Specialty Exam: Physical Exam  Review of Systems  There were no vitals taken for this visit.There is no height or weight on file to calculate BMI.  General Appearance: NA  Eye Contact:  NA  Speech:  Clear and Coherent  Volume:  Normal  Mood:  Euthymic  Affect:  NA  Thought Process:  Goal Directed  Orientation:  Full (Time, Place, and Person)  Thought Content:  Logical  Suicidal Thoughts:  No  Homicidal Thoughts:  No  Memory:  Immediate;   Good Recent;   Good Remote;   Good  Judgement:  Intact  Insight:  Present  Psychomotor Activity:  NA  Concentration:  Concentration: Fair and Attention Span: Fair  Recall:  Good  Fund of Knowledge:  Good  Language:  Good  Akathisia:  No  Handed:  Right  AIMS (if indicated):     Assets:  Communication Skills Desire for Improvement Housing Resilience  ADL's:  Intact  Cognition:  WNL  Sleep:         Assessment and Plan: Primary insomnia, major depressive disorder, recurrent.  Generalized anxiety disorder.  Patient doing better.  Since cut down the Lyrica from 75 mg to 50 mg she did not see worsening of symptoms.  I recommend that she should try cutting down further to take only 25 mg as she is already taking muscle relaxant including tizanidine and methocarbamol.  But she is reluctant but agreed  to give a try.  We will continue current dose of Seroquel 400 mg at bedtime, Wellbutrin XL 150 mg in the morning, Klonopin 0.5 mg daily as needed for anxiety, Rozerem 8 mg at bedtime, doxepin 75 mg at bedtime and new dose of Lyrica 25 mg at bedtime.  I recommend to call us back if she is any question of any concern.  She does not feel that she needs therapy since things are going well.  Follow-up in 3 months.  Follow Up Instructions:    I discussed the assessment and treatment plan with the  patient. The patient was provided an opportunity to ask questions and all were answered. The patient agreed with the plan and demonstrated an understanding of the instructions.   The patient was advised to call back or seek an in-person evaluation if the symptoms worsen or if the condition fails to improve as anticipated.  I provided 20 minutes of non-face-to-face time during this encounter.   [name], MD

## 2020-03-31 ENCOUNTER — Ambulatory Visit: Payer: Medicare Other | Admitting: Orthopaedic Surgery

## 2020-04-11 ENCOUNTER — Ambulatory Visit: Payer: Medicare Other | Admitting: Dietician

## 2020-04-19 ENCOUNTER — Other Ambulatory Visit: Payer: Self-pay | Admitting: Family Medicine

## 2020-04-19 DIAGNOSIS — Z8781 Personal history of (healed) traumatic fracture: Secondary | ICD-10-CM

## 2020-04-20 ENCOUNTER — Telehealth: Payer: Self-pay

## 2020-04-20 NOTE — Telephone Encounter (Signed)
Patient called stating that her Psychiatrist decreased her Lyrica and she is not sleeping well. Requesting to take 2.5 to 3 muscle relaxer to help her sleep at night.

## 2020-04-20 NOTE — Telephone Encounter (Signed)
That would be too much of the muscle relaxer to take at one time; Kendra Simmons can you please move her appointment with me earlier if possible so I can help find her a safer agent? Thank you!

## 2020-04-21 ENCOUNTER — Other Ambulatory Visit (HOSPITAL_COMMUNITY): Payer: Self-pay | Admitting: *Deleted

## 2020-04-21 ENCOUNTER — Telehealth (HOSPITAL_COMMUNITY): Payer: Self-pay | Admitting: *Deleted

## 2020-04-21 DIAGNOSIS — F330 Major depressive disorder, recurrent, mild: Secondary | ICD-10-CM

## 2020-04-21 MED ORDER — PREGABALIN 50 MG PO CAPS: 50.0000 mg | ORAL_CAPSULE | Freq: Every day | ORAL | 0 refills | Status: DC

## 2020-04-21 NOTE — Telephone Encounter (Signed)
Offered a 5/20 @ 9am to Kendra Simmons, she declined stated she would keep her original appt.

## 2020-04-21 NOTE — Telephone Encounter (Signed)
Pt called with c/o decreased sleep since changing Lyrica dose to 25mg . Pt also takes Rozerem 8mg  qhs. Looks like pt has been asking to take 2.5 or 3 of her muscle relaxer at hs. Her next appointment is on 06/22/20. Please review and advise.

## 2020-04-21 NOTE — Telephone Encounter (Signed)
Patient notified and scheduled 

## 2020-04-21 NOTE — Telephone Encounter (Signed)
We have discussed on last visit that if 25 mg Lyrica did not help then she can go back to 50.  I understand she is taking too many medication but she had cut down Lyrica from 75 mg.  Please call new prescription of Lyrica 50 mg to her pharmacy.  We will continue to try reducing her medication on her future visits.

## 2020-04-28 ENCOUNTER — Ambulatory Visit: Payer: Medicare Other | Admitting: Physical Medicine and Rehabilitation

## 2020-04-29 ENCOUNTER — Encounter
Payer: Medicare Other | Attending: Physical Medicine and Rehabilitation | Admitting: Physical Medicine and Rehabilitation

## 2020-04-29 ENCOUNTER — Other Ambulatory Visit: Payer: Self-pay

## 2020-04-29 VITALS — BP 119/81 | HR 102 | Temp 98.5°F | Ht 64.0 in | Wt 186.0 lb

## 2020-04-29 DIAGNOSIS — M25551 Pain in right hip: Secondary | ICD-10-CM | POA: Diagnosis present

## 2020-04-29 DIAGNOSIS — M545 Low back pain, unspecified: Secondary | ICD-10-CM

## 2020-04-29 DIAGNOSIS — E669 Obesity, unspecified: Secondary | ICD-10-CM

## 2020-04-29 DIAGNOSIS — G8929 Other chronic pain: Secondary | ICD-10-CM | POA: Diagnosis not present

## 2020-04-29 DIAGNOSIS — Z6831 Body mass index (BMI) 31.0-31.9, adult: Secondary | ICD-10-CM

## 2020-04-29 DIAGNOSIS — F330 Major depressive disorder, recurrent, mild: Secondary | ICD-10-CM | POA: Diagnosis not present

## 2020-04-29 DIAGNOSIS — T50905S Adverse effect of unspecified drugs, medicaments and biological substances, sequela: Secondary | ICD-10-CM | POA: Insufficient documentation

## 2020-04-29 MED ORDER — TIZANIDINE HCL 4 MG PO TABS: 4.0000 mg | ORAL_TABLET | Freq: Two times a day (BID) | ORAL | 2 refills | Status: DC | PRN

## 2020-04-29 MED ORDER — METHOCARBAMOL 500 MG PO TABS: 500.0000 mg | ORAL_TABLET | Freq: Four times a day (QID) | ORAL | 3 refills | Status: DC | PRN

## 2020-04-29 MED ORDER — PREGABALIN 50 MG PO CAPS: 50.0000 mg | ORAL_CAPSULE | Freq: Every day | ORAL | 0 refills | Status: DC

## 2020-04-29 NOTE — Progress Notes (Signed)
Subjective:    Patient ID: Kendra Simmons, female    DOB: Feb 01, 1951, 69 y.o.   MRN: [id number]  HPI  Kendra Simmons is a 69 year-old woman who presents today for follow-up of her lower back pain and bilateral hip pain. Her pain is usually well controlled but she often has spasms during the day and before she sleeps at night. She has been taking Tizanidine 4mg  HS and this has been helping her. She has Tramadol prescribed but has been taking this sparingly and does not feel much relief from it. Once when she had additional spasms during the day she took the Tizanidine and felt very sleepy afterward She has done therapy in the past and does not want to again at this time, though she knows she needs to exercise more. She used to walk in her church, but it is closed to walking now due to the pandemic.  Since last visit I performed bilateral greater trochancteric bursa injections and her pain has decreased from 6 to 4. Her Lyrica dose has been decreased by Dr. Adele Schilder, her psychiatrist, to avoid polypharmacy, and she feels this change has increased her pain and insomnia.    During today's visit she discusses extensively about her family stressors and how they are impacting her quality of life. She has regular appointments with Dr. Adele Schilder as well that she finds very helpful.   Pain Inventory Average Pain 4 Pain Right Now 2 My pain is aching  In the last 24 hours, has pain interfered with the following? General activity 0 Relation with others 0 Enjoyment of life 0 What TIME of day is your pain at its worst? morning night Sleep (in general) Fair  Pain is worse with: bending Pain improves with: medication Relief from Meds: 9  Mobility walk without assistance how many minutes can you walk? 30-40 ability to climb steps?  yes do you drive?  yes  Function retired  Neuro/Psych numbness depression anxiety  Prior Studies Any changes since last visit?  yes  Physicians involved in  your care Primary care . Psychologist .   Family History  Problem Relation Age of Onset  . Sleep apnea Father   . Alcohol abuse Father   . Diabetes Mother   . Diabetes Sister   . Diabetes Maternal Uncle   . Diabetes Cousin    Social History   Socioeconomic History  . Marital status: Divorced    Spouse name: Not on file  . Number of children: 3  . Years of education: 62  . Highest education level: Not on file  Occupational History  . Occupation: Retired  Tobacco Use  . Smoking status: Never Smoker  . Smokeless tobacco: Never Used  Substance and Sexual Activity  . Alcohol use: No    Alcohol/week: 0.0 standard drinks    Comment: zero  . Drug use: No    Types: Barbituates, Benzodiazepines    Comment: Denies any drug use other than benzos  . Sexual activity: Never    Birth control/protection: Abstinence  Other Topics Concern  . Not on file  Social History Narrative   Patient lives at home alone.   Caffeine Use:  2 cokes daily   Sister lives next door.   Social Determinants of Health   Financial Resource Strain:   . Difficulty of Paying Living Expenses:   Food Insecurity:   . Worried About Charity fundraiser in the Last Year:   . [place] in the Last  Year:   Transportation Needs:   . Film/video editor (Medical):   [name] Lack of Transportation (Non-Medical):   Physical Activity:   . Days of Exercise per Week:   . Minutes of Exercise per Session:   Stress:   . Feeling of Stress :   Social Connections:   . Frequency of Communication with Friends and Family:   . Frequency of Social Gatherings with Friends and Family:   . Attends Religious Services:   . Active Member of Clubs or Organizations:   . Attends Archivist Meetings:   [name] Marital Status:    Past Surgical History:  Procedure Laterality Date  . CESAREAN SECTION    . COSMETIC SURGERY    . ESOPHAGEAL MANOMETRY N/A 09/26/2016   Procedure: ESOPHAGEAL MANOMETRY (EM);  Surgeon: Clarene Essex, MD;  Location: [facility];  Service: Endoscopy;  Laterality: N/A;  . laproscopy     Past Medical History:  Diagnosis Date  . Anxiety   . Arthritis   . Depression   . Emotional depression 02/04/2015  . GERD (gastroesophageal reflux disease)   . High cholesterol   . Insomnia   . Memory loss    BP 119/81   Pulse (!) 102   Temp 98.5 F (36.9 C)   Ht 5\' 4"  (1.626 m)   Wt 186 lb (84.4 kg)   SpO2 97%   BMI 31.93 kg/m   Opioid Risk Score:   Fall Risk Score:  `1  Depression screen PHQ 2/9  Depression screen PHQ 2/9 03/09/2020  Decreased Interest 0  Down, Depressed, Hopeless 0  PHQ - 2 Score 0  Some encounter information is confidential and restricted. Go to Review Flowsheets activity to see all data.  Some recent data might be hidden    Review of Systems  Constitutional: Positive for unexpected weight change.  Neurological: Positive for numbness.  Psychiatric/Behavioral: Positive for dysphoric mood. The patient is nervous/anxious.        Objective:   Physical Exam Gen: no distress, normal appearing, appears less stressed and has lost weight in a healthy manner.  HEENT: oral mucosa pink and moist, NCAT Cardio: Reg rate Chest: normal effort, normal rate of breathing Abd: soft, non-distended Ext: no edema Skin: intact Neuro: Aox3.  Musculoskeletal: 5/5 strength throughout except 4/5 on right hip abduction. Full flexion and extension, more difficulty with extension. Tenderness to palpation along bilateral SI joints.  Psych: pleasant, normal affect     Assessment & Plan:  Kendra Simmons is a 69 year-old woman who presents today for follow-up of her lower back pain and bilateral greater trochanteric bursa pain syndrome.  I have personally reviewed most recent labs and orders in chart.   --Received two greater trochanteric bursa injections on 2/26 and 3/03 with excellent results.   --Provided renewal of Tizanidine and Robaxin. Increased dose of Lyrica back to  50mg  for pain and insomnia and discussed with Dr. Adele Schilder.  --Patient states she was recently diagnosed with diabetes Type 2. I have personally reviewed her labs and provided dietary and exercise advice. She has lost 14 lbs since our visit 3 months ago!  --Recommended use of Down Dog app to do 15 min of daily yoga with her daughter. Advised that this will help with both hip and low back pain.  --Discussed family stressors extensively. Her anxiety and depressed mood given her family is a large component of increased inflammation and pain. Advised that she focus on the positive relationships in her life and  the things that she enjoys to reduce her stress and grief.   RTC in 3 months to assess progress with above interventions.

## 2020-04-30 ENCOUNTER — Encounter: Payer: Self-pay | Admitting: Physical Medicine and Rehabilitation

## 2020-05-09 ENCOUNTER — Encounter: Payer: Medicare Other | Admitting: Physical Medicine and Rehabilitation

## 2020-05-13 ENCOUNTER — Other Ambulatory Visit (HOSPITAL_COMMUNITY): Payer: Self-pay | Admitting: Psychiatry

## 2020-05-13 ENCOUNTER — Other Ambulatory Visit: Payer: Self-pay | Admitting: Family Medicine

## 2020-05-13 DIAGNOSIS — F330 Major depressive disorder, recurrent, mild: Secondary | ICD-10-CM

## 2020-05-13 DIAGNOSIS — L989 Disorder of the skin and subcutaneous tissue, unspecified: Secondary | ICD-10-CM

## 2020-05-19 ENCOUNTER — Other Ambulatory Visit: Payer: Medicaid Other

## 2020-06-01 ENCOUNTER — Ambulatory Visit
Admission: RE | Admit: 2020-06-01 | Discharge: 2020-06-01 | Disposition: A | Discharge status: Home / Self Care | Payer: Medicaid Other | Source: Ambulatory Visit | Attending: Family Medicine | Admitting: Family Medicine

## 2020-06-01 DIAGNOSIS — L989 Disorder of the skin and subcutaneous tissue, unspecified: Secondary | ICD-10-CM

## 2020-06-06 ENCOUNTER — Other Ambulatory Visit (HOSPITAL_COMMUNITY): Payer: Self-pay | Admitting: Psychiatry

## 2020-06-06 DIAGNOSIS — F330 Major depressive disorder, recurrent, mild: Secondary | ICD-10-CM

## 2020-06-07 ENCOUNTER — Other Ambulatory Visit (HOSPITAL_COMMUNITY): Payer: Self-pay | Admitting: *Deleted

## 2020-06-07 DIAGNOSIS — F330 Major depressive disorder, recurrent, mild: Secondary | ICD-10-CM

## 2020-06-07 MED ORDER — QUETIAPINE FUMARATE 400 MG PO TABS: 400.0000 mg | ORAL_TABLET | Freq: Every day | ORAL | 0 refills | Status: DC

## 2020-06-11 ENCOUNTER — Other Ambulatory Visit (HOSPITAL_COMMUNITY): Payer: Self-pay | Admitting: Psychiatry

## 2020-06-11 DIAGNOSIS — F411 Generalized anxiety disorder: Secondary | ICD-10-CM

## 2020-06-11 DIAGNOSIS — F330 Major depressive disorder, recurrent, mild: Secondary | ICD-10-CM

## 2020-06-15 ENCOUNTER — Other Ambulatory Visit: Payer: Self-pay | Admitting: Family Medicine

## 2020-06-16 ENCOUNTER — Other Ambulatory Visit: Payer: Self-pay | Admitting: Family Medicine

## 2020-06-16 ENCOUNTER — Other Ambulatory Visit: Payer: Self-pay

## 2020-06-16 DIAGNOSIS — L989 Disorder of the skin and subcutaneous tissue, unspecified: Secondary | ICD-10-CM

## 2020-06-16 MED ORDER — TIZANIDINE HCL 4 MG PO TABS: 4.0000 mg | ORAL_TABLET | Freq: Two times a day (BID) | ORAL | 2 refills | Status: DC | PRN

## 2020-06-22 ENCOUNTER — Ambulatory Visit (HOSPITAL_COMMUNITY): Payer: Medicaid Other | Admitting: Psychiatry

## 2020-06-23 ENCOUNTER — Telehealth (INDEPENDENT_AMBULATORY_CARE_PROVIDER_SITE_OTHER): Payer: Medicare Other | Admitting: Psychiatry

## 2020-06-23 ENCOUNTER — Other Ambulatory Visit: Payer: Self-pay

## 2020-06-23 ENCOUNTER — Encounter (HOSPITAL_COMMUNITY): Payer: Self-pay | Admitting: Psychiatry

## 2020-06-23 DIAGNOSIS — F5101 Primary insomnia: Secondary | ICD-10-CM

## 2020-06-23 DIAGNOSIS — F330 Major depressive disorder, recurrent, mild: Secondary | ICD-10-CM

## 2020-06-23 DIAGNOSIS — F411 Generalized anxiety disorder: Secondary | ICD-10-CM | POA: Diagnosis not present

## 2020-06-23 MED ORDER — DOXEPIN HCL 75 MG PO CAPS: 75.0000 mg | ORAL_CAPSULE | Freq: Every day | ORAL | 0 refills | Status: DC

## 2020-06-23 MED ORDER — CLONAZEPAM 0.5 MG PO TABS: 0.5000 mg | ORAL_TABLET | Freq: Every day | ORAL | 0 refills | Status: DC | PRN

## 2020-06-23 MED ORDER — RAMELTEON 8 MG PO TABS: 8.0000 mg | ORAL_TABLET | Freq: Every day | ORAL | 2 refills | Status: DC

## 2020-06-23 MED ORDER — BUPROPION HCL ER (XL) 150 MG PO TB24: ORAL_TABLET | ORAL | 0 refills | Status: DC

## 2020-06-23 MED ORDER — QUETIAPINE FUMARATE 400 MG PO TABS: 400.0000 mg | ORAL_TABLET | Freq: Every day | ORAL | 0 refills | Status: DC

## 2020-06-23 NOTE — Progress Notes (Signed)
Virtual Visit via Telephone Note  I connected with Kendra Simmons on 06/23/20 at 10:00 AM EDT by telephone and verified that I am speaking with the correct person using two identifiers.  Location: Kendra Simmons: home Provider: home office   I discussed the limitations, risks, security and privacy concerns of performing an evaluation and management service by telephone and the availability of in person appointments. I also discussed with the Kendra Simmons that there may be a Kendra Simmons responsible charge related to this service. The Kendra Simmons expressed understanding and agreed to proceed.   History of Present Illness: Kendra Simmons is evaluated by phone session.  She is getting Lyrica from her pain management and dose increased from 25 mg to 50 mg.  Kendra Simmons overall doing okay on her medication.  She lives with her daughter and some time having issues with her but overall she feels that she can handle the situation better.  She has not seen her therapist Dr. Marlowe Sax more than a year but promised that if needed she will contact.  She is sleeping at least 6 hours.  She has lower back pain and sometimes she has difficulty doing daily routine.  She bought a stationary bike and trying to lose weight and so far she has lost few pounds since the last visit.  She has no tremors, shakes or any EPS.  She denies any anxiety or panic attack.  She denies any crying spells or any feeling of hopelessness or worthlessness.  She does not want to change or reduce the medication despite taking multiple medication.  She is comfortable with the dose.  She denies drinking or using any illegal substances.    Past Psychiatric History:Reviewed. H/Omultiple hospitalization.LastinpatientJune 2016. MultipleIOP. H/O hallucination, paranoiaanddepression. In the past seenAlan Watt, [name] and Dr. Thurmond Butts. TriedXanax, amitriptyline, imipramine, temazepam, trazodone, Zyprexa, Luvox, Wellbutrin, Prozac, Vistaril, Remeronand  Bellsomra.    Psychiatric Specialty Exam: Physical Exam  Review of Systems  Musculoskeletal: Positive for back pain.    There were no vitals taken for this visit.There is no height or weight on file to calculate BMI.  General Appearance: NA  Eye Contact:  NA  Speech:  Clear and Coherent and Slow  Volume:  Decreased  Mood:  Dysphoric  Affect:  NA  Thought Process:  Goal Directed  Orientation:  Full (Time, Place, and Person)  Thought Content:  Rumination  Suicidal Thoughts:  No  Homicidal Thoughts:  No  Memory:  Immediate;   Good Recent;   Good Remote;   Fair  Judgement:  Intact  Insight:  Present  Psychomotor Activity:  NA  Concentration:  Concentration: Fair and Attention Span: Fair  Recall:  Good  Fund of Knowledge:  Good  Language:  Good  Akathisia:  No  Handed:  Right  AIMS (if indicated):     Assets:  Communication Skills Desire for Improvement Housing Resilience Social Support  ADL's:  Intact  Cognition:  WNL  Sleep:   6 to 7 hours      Assessment and Plan: Primary insomnia.  Major depressive disorder, recurrent.  Generalized anxiety disorder.  Kendra Simmons is a stable on her medication.  She is getting the Lyrica 50 mg from her pain management.  We discussed cutting down the medication since she is taking multiple medicine but Kendra Simmons is very reluctant uncomfortable with the current medication regimen.  She is aware if symptoms started to get worse then she will call us and if she needs therapy then she can contact Dr. Marlowe Sax her therapist.  I will continue Wellbutrin XL 150 mg daily in the morning, Klonopin 0.5 mg at bedtime for anxiety, Rozerem 8 mg at bedtime, doxepin 25 mg at bedtime and Seroquel 400 mg at bedtime.  Follow-up in 3 months.  Follow Up Instructions:    I discussed the assessment and treatment plan with the Kendra Simmons. The Kendra Simmons was provided an opportunity to ask questions and all were answered. The Kendra Simmons agreed with the plan and  demonstrated an understanding of the instructions.   The Kendra Simmons was advised to call back or seek an in-person evaluation if the symptoms worsen or if the condition fails to improve as anticipated.  I provided 15 minutes of non-face-to-face time during this encounter.   [name], MD

## 2020-06-24 ENCOUNTER — Other Ambulatory Visit: Payer: Self-pay | Admitting: Family Medicine

## 2020-06-24 DIAGNOSIS — Z780 Asymptomatic menopausal state: Secondary | ICD-10-CM

## 2020-06-24 DIAGNOSIS — Z8781 Personal history of (healed) traumatic fracture: Secondary | ICD-10-CM

## 2020-06-28 ENCOUNTER — Other Ambulatory Visit: Payer: Self-pay

## 2020-06-28 ENCOUNTER — Ambulatory Visit
Admission: RE | Admit: 2020-06-28 | Discharge: 2020-06-28 | Disposition: A | Discharge status: Home / Self Care | Payer: Medicaid Other | Source: Ambulatory Visit | Attending: Family Medicine | Admitting: Family Medicine

## 2020-06-28 DIAGNOSIS — Z8781 Personal history of (healed) traumatic fracture: Secondary | ICD-10-CM

## 2020-06-28 DIAGNOSIS — Z780 Asymptomatic menopausal state: Secondary | ICD-10-CM

## 2020-07-26 ENCOUNTER — Other Ambulatory Visit: Payer: Self-pay

## 2020-07-26 ENCOUNTER — Ambulatory Visit
Admission: RE | Admit: 2020-07-26 | Discharge: 2020-07-26 | Disposition: A | Discharge status: Home / Self Care | Payer: Medicare Other | Source: Ambulatory Visit | Attending: Family Medicine | Admitting: Family Medicine

## 2020-07-26 DIAGNOSIS — L989 Disorder of the skin and subcutaneous tissue, unspecified: Secondary | ICD-10-CM

## 2020-07-26 MED ORDER — GADOBENATE DIMEGLUMINE 529 MG/ML IV SOLN
17.0000 mL | Freq: Once | INTRAVENOUS | Status: AC | PRN
  Administered 2020-07-26: 17 mL via INTRAVENOUS

## 2020-07-27 ENCOUNTER — Other Ambulatory Visit (HOSPITAL_COMMUNITY): Payer: Self-pay | Admitting: Psychiatry

## 2020-07-27 DIAGNOSIS — F330 Major depressive disorder, recurrent, mild: Secondary | ICD-10-CM

## 2020-07-29 ENCOUNTER — Other Ambulatory Visit: Payer: Self-pay | Admitting: Physical Medicine and Rehabilitation

## 2020-07-29 ENCOUNTER — Telehealth: Payer: Self-pay

## 2020-07-29 MED ORDER — PREGABALIN 75 MG PO CAPS
75.0000 mg | ORAL_CAPSULE | Freq: Every day | ORAL | 2 refills | Status: DC
Start: 2020-07-29 — End: 2020-10-24

## 2020-07-29 NOTE — Telephone Encounter (Signed)
That would be fine; I will send a new script for her

## 2020-07-29 NOTE — Telephone Encounter (Signed)
Patient called stating that she saw Dr. Mannie Stabile and Dr. Mannie Stabile thinks her Lyrica needs to be increased to 75 mg at bedtime. Please advise.

## 2020-08-01 ENCOUNTER — Telehealth: Payer: Self-pay

## 2020-08-01 NOTE — Telephone Encounter (Signed)
Patient called to confirm we saw rqust for increase to 75 - told her Dr. Ranell Patrick hd acknowledged and sent RX based on notes

## 2020-08-01 NOTE — Telephone Encounter (Signed)
Thank you for letting her know!

## 2020-08-18 ENCOUNTER — Ambulatory Visit: Payer: Medicaid Other | Admitting: Physical Medicine and Rehabilitation

## 2020-08-25 ENCOUNTER — Other Ambulatory Visit: Payer: Self-pay

## 2020-08-25 ENCOUNTER — Encounter: Payer: Self-pay | Admitting: Physical Medicine and Rehabilitation

## 2020-08-25 ENCOUNTER — Encounter
Payer: Medicare Other | Attending: Physical Medicine and Rehabilitation | Admitting: Physical Medicine and Rehabilitation

## 2020-08-25 VITALS — BP 126/79 | HR 91 | Temp 98.1°F | Ht 64.0 in | Wt 187.0 lb

## 2020-08-25 DIAGNOSIS — M545 Low back pain, unspecified: Secondary | ICD-10-CM

## 2020-08-25 DIAGNOSIS — E119 Type 2 diabetes mellitus without complications: Secondary | ICD-10-CM

## 2020-08-25 DIAGNOSIS — M7062 Trochanteric bursitis, left hip: Secondary | ICD-10-CM

## 2020-08-25 DIAGNOSIS — M7061 Trochanteric bursitis, right hip: Secondary | ICD-10-CM

## 2020-08-25 DIAGNOSIS — Z6832 Body mass index (BMI) 32.0-32.9, adult: Secondary | ICD-10-CM | POA: Diagnosis present

## 2020-08-25 DIAGNOSIS — G8929 Other chronic pain: Secondary | ICD-10-CM

## 2020-08-25 DIAGNOSIS — E669 Obesity, unspecified: Secondary | ICD-10-CM | POA: Diagnosis not present

## 2020-08-25 MED ORDER — METHOCARBAMOL 500 MG PO TABS: 500.0000 mg | ORAL_TABLET | Freq: Four times a day (QID) | ORAL | 3 refills | Status: DC | PRN

## 2020-08-25 NOTE — Progress Notes (Signed)
Subjective:    Patient ID: Kendra Simmons, female    DOB: [birth year], 69 y.o.   MRN: [id number]  HPI  Since last visit, she has still been having bilateral hip pain. The increased dose of Lyrica has helped. I sent this for her prior to last visit.   She does not eat anything until lunch time. She eats a lot of strawberries and lunch. She feels she needs to lay off the bread. Her daughter bakes pizza and then it is hard for her to resist this. She eats a lot of broccoli ad stir fired vegetables. She drinks lemonade. She went to the dentist and was advised not to eat tea. If she eats a big lunch she does not eat much supper.   She takes probiotics and omeprazole.   Prior history:  Kendra Simmons is a 69 year-old woman who presents today for follow-up of her lower back pain and bilateral hip pain. Her pain is usually well controlled but she often has spasms during the day and before she sleeps at night. She has been taking Tizanidine 4mg  HS and this has been helping her. She has Tramadol prescribed but has been taking this sparingly and does not feel much relief from it. Once when she had additional spasms during the day she took the Tizanidine and felt very sleepy afterward She has done therapy in the past and does not want to again at this time, though she knows she needs to exercise more. She used to walk in her church, but it is closed to walking now due to the pandemic.  Since last visit I performed bilateral greater trochancteric bursa injections and her pain has decreased from 6 to 4. Her Lyrica dose has been decreased by Dr. Adele Schilder, her psychiatrist, to avoid polypharmacy, and she feels this change has increased her pain and insomnia.    During today's visit she discusses extensively about her family stressors and how they are impacting her quality of life. She has regular appointments with Dr. Adele Schilder as well that she finds very helpful.   Pain Inventory Average Pain 1 Pain Right  Now 1 My pain is aching  In the last 24 hours, has pain interfered with the following? General activity 1 Relation with others 1 Enjoyment of life 1 What TIME of day is your pain at its worst? morning Sleep (in general) Good  Pain is worse with: bending and some activites Pain improves with: rest and medication Relief from Meds: 5  Mobility walk without assistance how many minutes can you walk? 30-40 ability to climb steps?  yes do you drive?  yes  Function retired  Neuro/Psych numbness depression anxiety  Prior Studies Any changes since last visit?  no  Physicians involved in your care Any changes since last visit?  no   Family History  Problem Relation Age of Onset  . Sleep apnea Father   . Alcohol abuse Father   . Diabetes Mother   . Diabetes Sister   . Diabetes Maternal Uncle   . Diabetes Cousin    Social History   Socioeconomic History  . Marital status: Divorced    Spouse name: Not on file  . Number of children: 3  . Years of education: 75  . Highest education level: Not on file  Occupational History  . Occupation: Retired  Tobacco Use  . Smoking status: Never Smoker  . Smokeless tobacco: Never Used  Vaping Use  . Vaping Use: Never used  Substance and  Sexual Activity  . Alcohol use: No    Alcohol/week: 0.0 standard drinks    Comment: zero  . Drug use: No    Types: Barbituates, Benzodiazepines    Comment: Denies any drug use other than benzos  . Sexual activity: Never    Birth control/protection: Abstinence  Other Topics Concern  . Not on file  Social History Narrative   Patient lives at home alone.   Caffeine Use:  2 cokes daily   Sister lives next door.   Social Determinants of Health   Financial Resource Strain:   . Difficulty of Paying Living Expenses: Not on file  Food Insecurity:   . Worried About Charity fundraiser in the Last Year: Not on file  . Ran Out of Food in the Last Year: Not on file  Transportation Needs:   .  Lack of Transportation (Medical): Not on file  . Lack of Transportation (Non-Medical): Not on file  Physical Activity:   . Days of Exercise per Week: Not on file  . Minutes of Exercise per Session: Not on file  Stress:   . Feeling of Stress : Not on file  Social Connections:   . Frequency of Communication with Friends and Family: Not on file  . Frequency of Social Gatherings with Friends and Family: Not on file  . Attends Religious Services: Not on file  . Active Member of Clubs or Organizations: Not on file  . Attends Archivist Meetings: Not on file  . Marital Status: Not on file   Past Surgical History:  Procedure Laterality Date  . CESAREAN SECTION    . COSMETIC SURGERY    . ESOPHAGEAL MANOMETRY N/A 09/26/2016   Procedure: ESOPHAGEAL MANOMETRY (EM);  Surgeon: Clarene Essex, MD;  Location: [facility];  Service: Endoscopy;  Laterality: N/A;  . laproscopy     Past Medical History:  Diagnosis Date  . Anxiety   . Arthritis   . Depression   . Emotional depression 02/04/2015  . GERD (gastroesophageal reflux disease)   . High cholesterol   . Insomnia   . Memory loss    There were no vitals taken for this visit.  Opioid Risk Score:   Fall Risk Score:  `1  Depression screen PHQ 2/9  Depression screen PHQ 2/9 03/09/2020  Decreased Interest 0  Down, Depressed, Hopeless 0  PHQ - 2 Score 0  Some encounter information is confidential and restricted. Go to Review Flowsheets activity to see all data.  Some recent data might be hidden    Review of Systems  Constitutional: Positive for unexpected weight change.  Neurological: Positive for numbness.  Psychiatric/Behavioral: Positive for dysphoric mood. The patient is nervous/anxious.        Objective:   Physical Exam Gen: no distress, normal appearing HEENT: oral mucosa pink and moist, NCAT Cardio: Reg rate Chest: normal effort, normal rate of breathing Abd: soft, non-distended Ext: no edema Skin:  intact Musculoskeletal: 5/5 strength throughout except 4/5 on right hip abduction. Full flexion and extension, more difficulty with extension. Tenderness to palpation along bilateral SI joints.  Psych: pleasant, normal affect Assessment & Plan:  Kendra Simmons is a 69 year-old woman who presents today for follow-up of her lower back pain and bilateral greater trochanteric bursa pain syndrome.  I have personally reviewed most recent labs and orders in chart.   --Received two greater trochanteric bursa injections on 2/26 and 3/03 with excellent results. By now thise results have worn off. She continues to  take the [facility]- she usually takes only when she has to sweep and mop. She uses voltaren gel. She does not have to use this very often.   --Provided renewal of Robaxin. Has enough Tizanidine. Increased dose of Lyrica to 75mg  for pain and insomnia. Use proper bed.   --Patient states she was recently diagnosed with diabetes Type 2. I have personally reviewed her labs and provided dietary and exercise advice. She has lost 13 lbs since our visit 3 months ago! Discussed her current diet. Recommend   Osteopenia: Vitamin D supplement, calcium supplement. Weight-bearing exercise. Continue Cubie to exercise while watching television.   B12 deficiency: Continue supplement.  General health:  --Recommended use of Down Dog app to do 15 min of daily yoga with her daughter. Advised that this will help with both hip and low back pain.  Recommended eating pain relieving foods and provided with a handout.  Recommended Roobois tea for its multiple benefits.   --Discussed family stressors extensively. Her anxiety and depressed mood given her family is a large component of increased inflammation and pain. Advised that she focus on the positive relationships in her life and the things that she enjoys to reduce her stress and grief. Discussed her recent stressor regarding a cat her daughter brought in  that is stressing her other cats.   -She has thought a lot about getting back to work. 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 Encouraged her to do this! I think that is a wonderful idea!  RTC in 3 months to assess progress with above interventions.   40 minutes spent in discussion with her pain, pain-relieving foods, discussion of her pain medications, Roobois tea, Cubbi, meralgia paresthetica, vitamin D and B12 deficiency, osteopenia, yoga.

## 2020-08-25 NOTE — Patient Instructions (Signed)
roobois tea

## 2020-08-30 ENCOUNTER — Telehealth: Payer: Self-pay | Admitting: Physical Medicine and Rehabilitation

## 2020-08-30 NOTE — Telephone Encounter (Signed)
Had questions about Tea, for weight loss, and pain in neck has done heat and Ice, and creams has pain at base of neck, is not sure if there is anything else recommended, or anti inflammatory.

## 2020-08-30 NOTE — Telephone Encounter (Signed)
Will call now, thank you!

## 2020-09-14 ENCOUNTER — Other Ambulatory Visit (HOSPITAL_COMMUNITY): Payer: Self-pay | Admitting: Psychiatry

## 2020-09-14 DIAGNOSIS — F330 Major depressive disorder, recurrent, mild: Secondary | ICD-10-CM

## 2020-09-14 DIAGNOSIS — F5101 Primary insomnia: Secondary | ICD-10-CM

## 2020-09-17 ENCOUNTER — Other Ambulatory Visit (HOSPITAL_COMMUNITY): Payer: Self-pay | Admitting: Psychiatry

## 2020-09-17 DIAGNOSIS — F330 Major depressive disorder, recurrent, mild: Secondary | ICD-10-CM

## 2020-09-17 DIAGNOSIS — F411 Generalized anxiety disorder: Secondary | ICD-10-CM

## 2020-09-20 ENCOUNTER — Other Ambulatory Visit (HOSPITAL_COMMUNITY): Payer: Self-pay | Admitting: Psychiatry

## 2020-09-20 DIAGNOSIS — F330 Major depressive disorder, recurrent, mild: Secondary | ICD-10-CM

## 2020-09-22 ENCOUNTER — Encounter (HOSPITAL_COMMUNITY): Payer: Self-pay | Admitting: Psychiatry

## 2020-09-22 ENCOUNTER — Other Ambulatory Visit: Payer: Self-pay

## 2020-09-22 ENCOUNTER — Telehealth (INDEPENDENT_AMBULATORY_CARE_PROVIDER_SITE_OTHER): Payer: Medicare Other | Admitting: Psychiatry

## 2020-09-22 DIAGNOSIS — F5101 Primary insomnia: Secondary | ICD-10-CM | POA: Diagnosis not present

## 2020-09-22 DIAGNOSIS — F330 Major depressive disorder, recurrent, mild: Secondary | ICD-10-CM | POA: Diagnosis not present

## 2020-09-22 DIAGNOSIS — F411 Generalized anxiety disorder: Secondary | ICD-10-CM | POA: Diagnosis not present

## 2020-09-22 MED ORDER — RAMELTEON 8 MG PO TABS: 8.0000 mg | ORAL_TABLET | Freq: Every day | ORAL | 2 refills | Status: DC

## 2020-09-22 MED ORDER — DOXEPIN HCL 75 MG PO CAPS: 75.0000 mg | ORAL_CAPSULE | Freq: Every day | ORAL | 0 refills | Status: DC

## 2020-09-22 MED ORDER — CLONAZEPAM 0.5 MG PO TABS: 0.5000 mg | ORAL_TABLET | Freq: Every day | ORAL | 0 refills | Status: DC | PRN

## 2020-09-22 MED ORDER — BUPROPION HCL ER (XL) 150 MG PO TB24: ORAL_TABLET | ORAL | 0 refills | Status: DC

## 2020-09-22 MED ORDER — QUETIAPINE FUMARATE 400 MG PO TABS: 400.0000 mg | ORAL_TABLET | Freq: Every day | ORAL | 2 refills | Status: DC

## 2020-09-22 NOTE — Progress Notes (Signed)
Virtual Visit via Telephone Note  I connected with Kendra Simmons on 09/22/20 at 11:20 AM EDT by telephone and verified that I am speaking with the correct person using two identifiers.  Location: Patient: home Provider: home office   I discussed the limitations, risks, security and privacy concerns of performing an evaluation and management service by telephone and the availability of in person appointments. I also discussed with the patient that there may be a patient responsible charge related to this service. The patient expressed understanding and agreed to proceed.   History of Present Illness: Patient is evaluated by phone session.  She is sleeping better since her physician increase Lyrica to 75 mg daily.  She endorses her condition is chronic but stable.  She denies any crying spells but lately having issues with the daughter.  She believes her daughter has bipolar disorder and she goes with absent down.  She is sleeping too we consider counseling with Dr. Marlowe Sax.  She had a good summer.  She went beach few times.  Her sleep is better.  She is trying to lose weight and she had lost another 7 pounds in past few months.  She is watching her calorie intake.  She has a stationary bike and that helps off.  We talked about polypharmacy but patient is reluctant to cut down the medication since it is working well.  She has no tremors, shakes or any EPS.  She is comfortable with the dose.   Past Psychiatric History: H/Omultiple inpatient and IOP. LastinpatientJune 2016. H/O hallucination, paranoiaanddepression. Saw [name], [name] and Dr. Thurmond Butts. TriedXanax, amitriptyline, imipramine, temazepam, trazodone, Zyprexa, Luvox, Wellbutrin, Prozac, Vistaril, Remeronand Bellsomra.   Psychiatric Specialty Exam: Physical Exam  Review of Systems  Weight 180 lb (81.6 kg).There is no height or weight on file to calculate BMI.  General Appearance: NA  Eye Contact:  NA  Speech:   Clear and Coherent  Volume:  Decreased  Mood:  Euthymic  Affect:  NA  Thought Process:  Goal Directed  Orientation:  Full (Time, Place, and Person)  Thought Content:  Logical  Suicidal Thoughts:  No  Homicidal Thoughts:  No  Memory:  Immediate;   Good Recent;   Good Remote;   Fair  Judgement:  Intact  Insight:  Present  Psychomotor Activity:  NA  Concentration:  Concentration: Fair and Attention Span: Fair  Recall:  Good  Fund of Knowledge:  Good  Language:  Good  Akathisia:  No  Handed:  Right  AIMS (if indicated):     Assets:  Communication Skills Desire for Improvement Housing  ADL's:  Intact  Cognition:  WNL  Sleep:   good      Assessment and Plan: Major depressive disorder, recurrent.  Generalized anxiety disorder.  Primary insomnia.  Patient is a stable on her current medication.  She is getting Lyrica 75 mg from her other physician which is helping her pain and sleep.  Discussed again polypharmacy but patient is reluctant to cut down the medication.  She is hoping to reconsider therapy with Dr. Marlowe Sax as she having issues with her daughter.  I encouraged that.  Continue Rozerem 8 mg at bedtime, doxepin 75 mg at bedtime, Seroquel 400 mg at bedtime, Klonopin 0.5 mg at bedtime and Wellbutrin XL 150 mg daily.  Recommended to call us back if she has any question or any concern.  Follow-up in 3 months.  Follow Up Instructions:    I discussed the assessment and treatment plan  with the patient. The patient was provided an opportunity to ask questions and all were answered. The patient agreed with the plan and demonstrated an understanding of the instructions.   The patient was advised to call back or seek an in-person evaluation if the symptoms worsen or if the condition fails to improve as anticipated.  I provided 17 minutes of non-face-to-face time during this encounter.   [name], MD

## 2020-10-17 ENCOUNTER — Other Ambulatory Visit (HOSPITAL_COMMUNITY): Payer: Self-pay | Admitting: Psychiatry

## 2020-10-17 ENCOUNTER — Telehealth: Payer: Self-pay | Admitting: Physical Medicine and Rehabilitation

## 2020-10-17 DIAGNOSIS — F330 Major depressive disorder, recurrent, mild: Secondary | ICD-10-CM

## 2020-10-17 NOTE — Telephone Encounter (Signed)
Please schedule at next available appointment, no ultrasound guidance needed, thank you!

## 2020-10-17 NOTE — Telephone Encounter (Signed)
Patient left voice message requesting hip injection. Please advise

## 2020-10-20 ENCOUNTER — Encounter
Payer: Medicare Other | Attending: Physical Medicine and Rehabilitation | Admitting: Physical Medicine and Rehabilitation

## 2020-10-20 ENCOUNTER — Encounter: Payer: Self-pay | Admitting: Physical Medicine and Rehabilitation

## 2020-10-20 ENCOUNTER — Other Ambulatory Visit: Payer: Self-pay

## 2020-10-20 VITALS — BP 142/85 | HR 92 | Temp 98.2°F | Ht 64.0 in | Wt 188.0 lb

## 2020-10-20 DIAGNOSIS — M25551 Pain in right hip: Secondary | ICD-10-CM | POA: Insufficient documentation

## 2020-10-20 MED ORDER — TIZANIDINE HCL 4 MG PO TABS: 4.0000 mg | ORAL_TABLET | Freq: Three times a day (TID) | ORAL | 2 refills | Status: DC | PRN

## 2020-10-20 NOTE — Progress Notes (Signed)
Trochanteric bursa injection, right side  Indication Right Trochanteric bursitis. Exam has tenderness over the greater trochanter of the hip. Pain has not responded to conservative care such as exercise therapy and oral medications. Pain interferes with sleep or with mobility Informed consent was obtained after describing risks and benefits of the procedure with the patient these include bleeding bruising and infection. Patient has signed written consent form. Patient placed in a lateral decubitus position with the affected hip superior. Point of maximal pain was palpated marked and prepped with Betadine and entered with a needle to bone contact. Needle slightly withdrawn then 1cc celestone with 4 cc 1% lidocaine were injected. Patient tolerated procedure well. Post procedure instructions given.

## 2020-10-24 ENCOUNTER — Other Ambulatory Visit: Payer: Self-pay | Admitting: *Deleted

## 2020-10-25 MED ORDER — PREGABALIN 75 MG PO CAPS: 75.0000 mg | ORAL_CAPSULE | Freq: Every day | ORAL | 2 refills | Status: DC

## 2020-11-14 ENCOUNTER — Telehealth (HOSPITAL_COMMUNITY): Payer: Self-pay | Admitting: *Deleted

## 2020-11-14 NOTE — Telephone Encounter (Signed)
Pt called c/o seeing "orange patterns", seeing people and objects through these patterns when she closes her eyes. Says this has been happening for two weeks. No recent med changes on any of her meds she says. I don't see where pt has seen neuro or eye doctor. Please review and advise. Pt next appointment until 12/21/20. Pt does not c/o of headache with these symptoms fyi.

## 2020-11-14 NOTE — Telephone Encounter (Signed)
Since there is no recent changes in the medication and she does not have any associated nausea, vomiting or headaches, I recommend she needs evaluation by eye doctor to rule out retinal detachment.

## 2020-11-20 ENCOUNTER — Other Ambulatory Visit: Payer: Self-pay

## 2020-11-20 ENCOUNTER — Ambulatory Visit
Admission: EM | Admit: 2020-11-20 | Discharge: 2020-11-20 | Disposition: A | Discharge status: Home / Self Care | Payer: Medicare Other | Attending: Urgent Care | Admitting: Urgent Care

## 2020-11-20 DIAGNOSIS — S91209A Unspecified open wound of unspecified toe(s) with damage to nail, initial encounter: Secondary | ICD-10-CM

## 2020-11-20 DIAGNOSIS — M79631 Pain in right forearm: Secondary | ICD-10-CM

## 2020-11-20 DIAGNOSIS — S51811A Laceration without foreign body of right forearm, initial encounter: Secondary | ICD-10-CM

## 2020-11-20 DIAGNOSIS — M79674 Pain in right toe(s): Secondary | ICD-10-CM | POA: Diagnosis not present

## 2020-11-20 MED ORDER — CEPHALEXIN 250 MG PO CAPS
250.0000 mg | ORAL_CAPSULE | Freq: Three times a day (TID) | ORAL | 0 refills | Status: DC
Start: 2020-11-20 — End: 2020-11-30

## 2020-11-20 NOTE — Discharge Instructions (Signed)
WOUND CARE for the laceration Please return in 7-10 days to have your stitches/staples removed or sooner if you have concerns.  Keep area clean and dry for 24 hours. Do not remove bandage, if applied.  After 24 hours, remove bandage and wash wound gently with mild soap and warm water. Reapply a new bandage after cleaning wound, if directed.  Continue daily cleansing with soap and water until stitches/staples are removed.  Do not apply any ointments or creams to the wound while stitches/staples are in place, as this may cause delayed healing.  Notify the office if you experience any of the following signs of infection: Swelling, redness, pus drainage, streaking, fever >101.0 F  Notify the office if you experience excessive bleeding that does not stop after 15-20 minutes of constant, firm pressure.  WOUND CARE for your toe Please change your dressing 2-3 times daily. Do not apply any ointments or creams. Each time you change your dressing, make sure you clean gently around the perimeter of the wound with gentle soap and warm water. Pat your wound dry and let it air out if possible for 1-2 hours before reapplying another dressing.

## 2020-11-20 NOTE — ED Triage Notes (Signed)
Patient states her right toe toenail fell off today and may be infected and her right forearm has a cut from a nail on it that occurred today as well. Pt is aox4 and ambulatory.

## 2020-11-20 NOTE — ED Provider Notes (Signed)
[place]   MRN: [id number] DOB: Jun 22, 1951  Subjective:   Kendra Simmons is a 69 y.o. female presenting for suffering a right forearm laceration today.  This happened from an animal earlier today and was accidental.  Tdap was updated about 6 months ago.  She also had a right toenail avulsion of the fourth toe yesterday.  Started to notice drainage from it and worsening pain, throbbing pain today.  She is concerned about infection, has had this in that same toe before.  No current facility-administered medications for this encounter.  Current Outpatient Medications:  .  buPROPion (WELLBUTRIN XL) 150 MG 24 hr tablet, TAKE 1 TABLET BY MOUTH EVERY DAY IN THE MORNING, Disp: 90 tablet, Rfl: 0 .  clonazePAM (KLONOPIN) 0.5 MG tablet, Take 1 tablet (0.5 mg total) by mouth daily as needed for anxiety., Disp: 90 tablet, Rfl: 0 .  doxepin (SINEQUAN) 75 MG capsule, Take 1 capsule (75 mg total) by mouth at bedtime., Disp: 90 capsule, Rfl: 0 .  ibuprofen (ADVIL,MOTRIN) 200 MG tablet, Take 400 mg by mouth every 4 (four) hours as needed for moderate pain. , Disp: , Rfl:  .  methocarbamol (ROBAXIN) 500 MG tablet, Take 1 tablet (500 mg total) by mouth every 6 (six) hours as needed for muscle spasms., Disp: 30 tablet, Rfl: 3 .  Multiple Vitamins-Minerals (MULTIVITAMIN PO), Take 1 tablet by mouth daily., Disp: , Rfl:  .  pravastatin (PRAVACHOL) 40 MG tablet, Take 40 mg by mouth every evening., Disp: , Rfl:  .  pregabalin (LYRICA) 75 MG capsule, Take 1 capsule (75 mg total) by mouth at bedtime., Disp: 90 capsule, Rfl: 2 .  QUEtiapine (SEROQUEL) 400 MG tablet, Take 1 tablet (400 mg total) by mouth at bedtime., Disp: 30 tablet, Rfl: 2 .  tiZANidine (ZANAFLEX) 4 MG tablet, Take 1 tablet (4 mg total) by mouth 3 (three) times daily as needed for muscle spasms., Disp: 90 tablet, Rfl: 2 .  acetaminophen (TYLENOL) 500 MG tablet, Take 500 mg by mouth every 4 (four) hours as needed for mild pain.,  Disp: , Rfl:  .  albuterol (VENTOLIN HFA) 108 (90 Base) MCG/ACT inhaler, Inhale 1-2 puffs into the lungs every 6 (six) hours as needed for wheezing or shortness of breath., Disp: 18 g, Rfl: 0 .  Calcium Carbonate (CALCIUM 600 PO), Take 1 tablet by mouth daily., Disp: , Rfl:  .  diclofenac Sodium (VOLTAREN) 1 % GEL, Apply 2 g topically 4 (four) times daily., Disp: 300 g, Rfl: 3 .  fluticasone (FLONASE) 50 MCG/ACT nasal spray, Place 2 sprays into both nostrils daily., Disp: 16 g, Rfl: 0 .  linaclotide (LINZESS) 72 MCG capsule, Take 1 capsule (72 mcg total) by mouth every other day., Disp: 30 capsule, Rfl: 0 .  memantine (NAMENDA) 10 MG tablet, Take 1 tablet (10 mg total) by mouth 2 (two) times daily., Disp: 60 tablet, Rfl: 12 .  omeprazole (PRILOSEC) 40 MG capsule, Take 40 mg by mouth daily. , Disp: , Rfl: 3 .  ondansetron (ZOFRAN ODT) 4 MG disintegrating tablet, Take 1 tablet (4 mg total) by mouth every 8 (eight) hours as needed for nausea or vomiting., Disp: 20 tablet, Rfl: 0 .  Probiotic Product (PROBIOTIC PO), Take 1 tablet by mouth daily., Disp: , Rfl:  .  ramelteon (ROZEREM) 8 MG tablet, Take 1 tablet (8 mg total) by mouth at bedtime., Disp: 30 tablet, Rfl: 2 .  sucralfate (CARAFATE) 1 GM/10ML suspension, Take 10 mLs (1 g total)  by mouth 4 (four) times daily -  with meals and at bedtime., Disp: 420 mL, Rfl: 0 .  tamsulosin (FLOMAX) 0.4 MG CAPS capsule, Take 1 capsule (0.4 mg total) by mouth daily after supper. For frequent urination/urgency, Disp: 15 capsule, Rfl: 0   Allergies  Allergen Reactions  . Phentermine Anxiety  . Augmentin [Amoxicillin-Pot Clavulanate] Diarrhea    Past Medical History:  Diagnosis Date  . Anxiety   . Arthritis   . Depression   . Emotional depression 02/04/2015  . GERD (gastroesophageal reflux disease)   . High cholesterol   . Insomnia   . Memory loss      Past Surgical History:  Procedure Laterality Date  . CESAREAN SECTION    . COSMETIC SURGERY    .  ESOPHAGEAL MANOMETRY N/A 09/26/2016   Procedure: ESOPHAGEAL MANOMETRY (EM);  Surgeon: Clarene Essex, MD;  Location: [facility];  Service: Endoscopy;  Laterality: N/A;  . laproscopy      Family History  Problem Relation Age of Onset  . Sleep apnea Father   . Alcohol abuse Father   . Diabetes Mother   . Diabetes Sister   . Diabetes Maternal Uncle   . Diabetes Cousin     Social History   Tobacco Use  . Smoking status: Never Smoker  . Smokeless tobacco: Never Used  Vaping Use  . Vaping Use: Never used  Substance Use Topics  . Alcohol use: No    Alcohol/week: 0.0 standard drinks    Comment: zero  . Drug use: No    Types: Barbituates, Benzodiazepines    Comment: Denies any drug use other than benzos    ROS   Objective:   Vitals: BP (!) 144/103 (BP Location: Right Arm)   Pulse 89   Temp 97.7 F (36.5 C) (Oral)   Resp 20   SpO2 98%   Physical Exam Constitutional:      General: She is not in acute distress.    Appearance: Normal appearance. She is well-developed. She is not ill-appearing, toxic-appearing or diaphoretic.  HENT:     Head: Normocephalic and atraumatic.     Nose: Nose normal.     Mouth/Throat:     Mouth: Mucous membranes are moist.     Pharynx: Oropharynx is clear.  Eyes:     General: No scleral icterus.       Right eye: No discharge.        Left eye: No discharge.     Extraocular Movements: Extraocular movements intact.     Conjunctiva/sclera: Conjunctivae normal.     Pupils: Pupils are equal, round, and reactive to light.  Cardiovascular:     Rate and Rhythm: Normal rate.  Pulmonary:     Effort: Pulmonary effort is normal.  Skin:    General: Skin is warm and dry.       Neurological:     General: No focal deficit present.     Mental Status: She is alert and oriented to person, place, and time.  Psychiatric:        Mood and Affect: Mood normal.        Behavior: Behavior normal.        Thought Content: Thought content normal.         Judgment: Judgment normal.     PROCEDURE NOTE: laceration repair Verbal consent obtained from patient.  Local anesthesia with 2cc Lidocaine 2% with epinephrine.  Wound explored for tendon, ligament damage. Wound scrubbed with soap and water and rinsed. Wound  closed with #4 5-0 Prolene (simple interrupted) sutures.  Wound cleansed and dressed.   Assessment and Plan :   PDMP not reviewed this encounter.  1. Right forearm pain   2. Laceration of right forearm, initial encounter   3. Toenail avulsion, initial encounter   4. Toe pain, right     Laceration repair successful.  Wound care reviewed.  Dressing applied to both the right fourth toe and her forearm laceration.  Counseled on general wound care.  Recommended monitoring for now and discussed signs and symptoms of infection warranting Keflex.  Follow-up in 7 to 10 days for suture removal. Counseled patient on potential for adverse effects with medications prescribed/recommended today, ER and return-to-clinic precautions discussed, patient verbalized understanding.    Jaynee Eagles, PA-C 11/20/20 [id number]

## 2020-11-29 ENCOUNTER — Telehealth: Payer: Self-pay | Admitting: *Deleted

## 2020-11-29 ENCOUNTER — Ambulatory Visit: Payer: Medicare Other | Admitting: Physical Medicine and Rehabilitation

## 2020-11-29 NOTE — Telephone Encounter (Signed)
I am fine with this if you can get Kendra Simmons!

## 2020-11-29 NOTE — Telephone Encounter (Signed)
Mrs Kreiser called and reports that she needs to get a left hip injection.  She has an appt tomorrow.  Will this be possible?  I will send to Dr Ranell Patrick for approval and Lattie Haw to check on ins auth.

## 2020-11-30 ENCOUNTER — Other Ambulatory Visit: Payer: Self-pay

## 2020-11-30 ENCOUNTER — Encounter: Payer: Self-pay | Admitting: Physical Medicine and Rehabilitation

## 2020-11-30 ENCOUNTER — Encounter
Payer: Medicare Other | Attending: Physical Medicine and Rehabilitation | Admitting: Physical Medicine and Rehabilitation

## 2020-11-30 VITALS — BP 115/79 | HR 96 | Temp 98.0°F | Ht 64.0 in | Wt 191.4 lb

## 2020-11-30 DIAGNOSIS — M7062 Trochanteric bursitis, left hip: Secondary | ICD-10-CM | POA: Diagnosis not present

## 2020-11-30 NOTE — Progress Notes (Signed)
  Trochanteric bursa injection, left  Indication Trochanteric bursitis. Exam has tenderness over the greater trochanter of the hip. Pain has not responded to conservative care such as exercise therapy and oral medications. Pain interferes with sleep or with mobility Informed consent was obtained after describing risks and benefits of the procedure with the patient these include bleeding bruising and infection. Patient has signed written consent form. Patient placed in a lateral decubitus position with the affected hip superior. Point of maximal pain was palpated marked and prepped with Betadine and entered with a needle to bone contact. Needle slightly withdrawn then 6mg  of triamcinolone with 4 cc 1% lidocaine were injected. Patient tolerated procedure well. Post procedure instructions given.

## 2020-12-17 ENCOUNTER — Other Ambulatory Visit (HOSPITAL_COMMUNITY): Payer: Self-pay | Admitting: Psychiatry

## 2020-12-17 DIAGNOSIS — F330 Major depressive disorder, recurrent, mild: Secondary | ICD-10-CM

## 2020-12-21 ENCOUNTER — Other Ambulatory Visit (HOSPITAL_COMMUNITY): Payer: Self-pay | Admitting: Psychiatry

## 2020-12-21 ENCOUNTER — Other Ambulatory Visit: Payer: Self-pay

## 2020-12-21 ENCOUNTER — Telehealth (INDEPENDENT_AMBULATORY_CARE_PROVIDER_SITE_OTHER): Payer: 59 | Admitting: Psychiatry

## 2020-12-21 ENCOUNTER — Encounter (HOSPITAL_COMMUNITY): Payer: Self-pay | Admitting: Psychiatry

## 2020-12-21 DIAGNOSIS — F5101 Primary insomnia: Secondary | ICD-10-CM | POA: Diagnosis not present

## 2020-12-21 DIAGNOSIS — F411 Generalized anxiety disorder: Secondary | ICD-10-CM

## 2020-12-21 DIAGNOSIS — F330 Major depressive disorder, recurrent, mild: Secondary | ICD-10-CM

## 2020-12-21 MED ORDER — CLONAZEPAM 0.5 MG PO TABS: 0.5000 mg | ORAL_TABLET | Freq: Every day | ORAL | 0 refills | Status: DC | PRN

## 2020-12-21 MED ORDER — BUPROPION HCL ER (XL) 150 MG PO TB24: ORAL_TABLET | ORAL | 0 refills | Status: DC

## 2020-12-21 MED ORDER — DOXEPIN HCL 75 MG PO CAPS: 75.0000 mg | ORAL_CAPSULE | Freq: Every day | ORAL | 0 refills | Status: DC

## 2020-12-21 MED ORDER — QUETIAPINE FUMARATE 400 MG PO TABS: 400.0000 mg | ORAL_TABLET | Freq: Every day | ORAL | 2 refills | Status: DC

## 2020-12-21 MED ORDER — RAMELTEON 8 MG PO TABS: 8.0000 mg | ORAL_TABLET | Freq: Every day | ORAL | 2 refills | Status: DC

## 2020-12-21 NOTE — Progress Notes (Signed)
Virtual Visit via Telephone Note  I connected with Jakeline Dave Omara on 12/21/20 at 11:20 AM EST by telephone and verified that I am speaking with the correct person using two identifiers.  Location: Patient: Home Provider: Home Office   I discussed the limitations, risks, security and privacy concerns of performing an evaluation and management service by telephone and the availability of in person appointments. I also discussed with the patient that there may be a patient responsible charge related to this service. The patient expressed understanding and agreed to proceed.   History of Present Illness: Patient is evaluated by phone.  She is on the phone by herself.  Patient reported her Christmas was sad because she was with her friends and usually in the past she was with the family but due to recent family situation she did not had a Christmas with the family.  She is taking her medication as prescribed.  However she noticed recently having changes in her sleep pattern.  Sometimes she see colors, zigzag pattern, having hallucinations before going to sleep.  She also noticed sleep paralysis and her daughter noticed that she is calling for help before she go to sleep.  She do not recall these hallucinations however she is very concerned.  So far she recalled 4 times it happens.  There has been no changes in her medication.  She is taking all her medication regularly and there is no recent addition of medication.  She had laceration in her arm and require stitches in the emergency room when she hurt her arm when she was looking something in the box.  Overall she feels her depression is chronic and is stable but does not feel she need to add any medication.  Despite taking multiple medication she still have occasions  when she feel sad.  She wanted to have therapy in person but lately most of the places are going virtual and she is not happy.  She had called few places if they are doing in person  therapy.  Her appetite is okay.  She has no tremors, shakes or any EPS.  She denies any hallucination, paranoia or any suicidal thoughts.  Her living situation is unchanged.  Her daughter lives with her and sometimes she has issues with her but no new concerns.   Past Psychiatric History: H/Omultiple inpatient and IOP. LastinpatientJune 2016. H/O hallucination, paranoiaanddepression. Saw Jorje Guild, Dr. Betti Cruz and Dr. Gareth Eagle. TriedXanax, amitriptyline, imipramine, temazepam, trazodone, Zyprexa, Luvox, Wellbutrin, Prozac, Vistaril, Remeronand Bellsomra.  Psychiatric Specialty Exam: Physical Exam  Review of Systems  Weight 191 lb (86.6 kg).There is no height or weight on file to calculate BMI.  General Appearance: NA  Eye Contact:  NA  Speech:  Normal Rate  Volume:  Normal  Mood:  Euthymic  Affect:  NA  Thought Process:  Goal Directed  Orientation:  Full (Time, Place, and Person)  Thought Content:  Rumination  Suicidal Thoughts:  No  Homicidal Thoughts:  No  Memory:  Immediate;   Good Recent;   Good Remote;   Fair  Judgement:  Intact  Insight:  Present  Psychomotor Activity:  NA  Concentration:  Concentration: Fair and Attention Span: Fair  Recall:  Good  Fund of Knowledge:  Good  Language:  Good  Akathisia:  No  Handed:  Right  AIMS (if indicated):     Assets:  Communication Skills Desire for Improvement Housing Resilience  ADL's:  Intact  Cognition:  WNL  Sleep:   ok  Assessment and Plan: Major depressive disorder, recurrent.  Generalized anxiety disorder.  Primary insomnia.    Discussed new concerns about her sleep which may be hypnagogic hallucinations, seizure-like activities.  Recommend that she should see a neurologist and have sleep study.  We will send a referral to [facility] neurology to address these concerns.  Patient does not feel the current medicine causing any issues since she has been on these medication for a while.  She used to see  neurology at [facility] neurology however has not been follow-up in a while.  I agree that she should see a therapist in person to addressed her chronic depression symptoms.  I will continue Wellbutrin XL 150 mg in the morning, Klonopin 0.5 mg as needed daily, doxepin 75 mg at bedtime, Rozerem 8 mg at bedtime and Seroquel 400 mg at bedtime.  She is also taking Lyrica from other provider.  We discussed polypharmacy in detail.  Recommended to call us back if there is any question or any concern.  She has been taking these medicine for a while does not feel any concerns.  Follow-up in 3 months.    Follow Up Instructions:    I discussed the assessment and treatment plan with the patient. The patient was provided an opportunity to ask questions and all were answered. The patient agreed with the plan and demonstrated an understanding of the instructions.   The patient was advised to call back or seek an in-person evaluation if the symptoms worsen or if the condition fails to improve as anticipated.  I provided 27 minutes of non-face-to-face time during this encounter.   [name], MD

## 2021-01-04 ENCOUNTER — Telehealth (HOSPITAL_COMMUNITY): Payer: Self-pay | Admitting: *Deleted

## 2021-01-04 ENCOUNTER — Telehealth (HOSPITAL_COMMUNITY): Payer: Self-pay

## 2021-01-04 NOTE — Telephone Encounter (Signed)
Received a fax from [facility] regarding patient's Clonazepam 0.5mg  for a "script clarification." I spoke with the pharmacist and he stated that the patient is requesting it to be filled a few days early. Pharmacist stated that she is due on 1/23 and will be completely out and wants to pick it up tomorrow. Please review and advise. Thank you
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
## 2021-01-04 NOTE — Telephone Encounter (Signed)
Ok thank you 

## 2021-01-05 ENCOUNTER — Ambulatory Visit (HOSPITAL_COMMUNITY): Payer: Medicare Other | Admitting: Clinical
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
## 2021-01-06 NOTE — Telephone Encounter (Signed)
Ok thank you 

## 2021-01-09 ENCOUNTER — Other Ambulatory Visit: Payer: Self-pay | Admitting: Physical Medicine and Rehabilitation

## 2021-01-09 ENCOUNTER — Telehealth (HOSPITAL_COMMUNITY): Payer: Self-pay | Admitting: *Deleted

## 2021-01-09 NOTE — Telephone Encounter (Signed)
Patient called regarding Neurological consult.  Made call to [facility] regarding consult.  They had referral but had not contacted patient yet. They plan to call. Made call to patient to inform. FYI

## 2021-01-31 ENCOUNTER — Other Ambulatory Visit: Payer: Self-pay

## 2021-01-31 ENCOUNTER — Encounter: Payer: 59 | Attending: Physical Medicine and Rehabilitation | Admitting: Physical Medicine and Rehabilitation

## 2021-01-31 VITALS — BP 131/64 | HR 82 | Temp 97.6°F | Ht 64.0 in | Wt 192.2 lb

## 2021-01-31 DIAGNOSIS — M7062 Trochanteric bursitis, left hip: Secondary | ICD-10-CM | POA: Insufficient documentation

## 2021-01-31 DIAGNOSIS — G4701 Insomnia due to medical condition: Secondary | ICD-10-CM | POA: Insufficient documentation

## 2021-01-31 DIAGNOSIS — M7061 Trochanteric bursitis, right hip: Secondary | ICD-10-CM | POA: Diagnosis present

## 2021-01-31 MED ORDER — PREGABALIN 100 MG PO CAPS
100.0000 mg | ORAL_CAPSULE | Freq: Every day | ORAL | 2 refills | Status: DC
Start: 2021-01-31 — End: 2021-05-12

## 2021-01-31 MED ORDER — METHOCARBAMOL 500 MG PO TABS: 500.0000 mg | ORAL_TABLET | Freq: Four times a day (QID) | ORAL | 3 refills | Status: DC | PRN

## 2021-01-31 NOTE — Patient Instructions (Addendum)
Roobois tea Green tea White tea  Blue butterfly or chamomile at night  Foods that can assist in weight loss:  1) Eggs  2) Leafy greens  3) Salmon  4) Cruciferous vegetables  5) Lean beef and chicken breast  6) Boiled potatoes  7) Tuna  8) Beans and legumes  9) Soups  10) Cottage cheese  11) Avocados  12) Apple cider vinegar  13) Nuts  14) Whole grains  15) Chili pepper  16) Fruit- berries are some of the best  17) Grapefruit  18) Chia seeds  19) Coconut oil  20) Full-fat yogurt  -Discussed supplements that can be used:  1) Metatrim 400mg  BID 30 minutes before breakfast and dinner  2) Sphaeranthus indicus and Garcinia mangostana (combinations of these and #1 can be found in capsicum and zychrome  3) green coffee bean extract 400mg  twice per day or Irvingia (african mango) 150 to 300mg  twice per day.

## 2021-01-31 NOTE — Progress Notes (Signed)
Subjective:    Patient ID: Kendra Simmons, female    DOB: 1951/09/24, 70 y.o.   MRN: [id number]  HPI  Since last visit, she has still been having bilateral hip pain secondary to greater trochanteric pain syndrome.  1) Bilateral greater trochanteric pain syndrome  2) Obesity: -She got down to 180 and climbed back up again -BMI is 32.99  -She has been considering lap band surgery. She is not sure if she would be approved for this.  -She plans to walk more in the summer.  3) Prediabetes  4) HLD  5) Depression: She takes Wellbutrin.   Prior history:  She does not eat anything until lunch time. She eats a lot of strawberries and lunch. She feels she needs to lay off the bread. Her daughter bakes pizza and then it is hard for her to resist this. She eats a lot of broccoli ad stir fired vegetables. She drinks lemonade. She went to the dentist and was advised not to eat tea. If she eats a big lunch she does not eat much supper.   She takes probiotics and omeprazole.   Prior history:  Kendra Simmons is a 70 year-old woman who presents today for follow-up of her lower back pain and bilateral hip pain. Her pain is usually well controlled but she often has spasms during the day and before she sleeps at night. She has been taking Tizanidine 4mg  HS and this has been helping her. She has Tramadol prescribed but has been taking this sparingly and does not feel much relief from it. Once when she had additional spasms during the day she took the Tizanidine and felt very sleepy afterward She has done therapy in the past and does not want to again at this time, though she knows she needs to exercise more. She used to walk in her church, but it is closed to walking now due to the pandemic.  Since last visit I performed bilateral greater trochancteric bursa injections and her pain has decreased from 6 to 4. Her Lyrica dose has been decreased by Dr. Adele Schilder, her psychiatrist, to avoid polypharmacy,  and she feels this change has increased her pain and insomnia.    During today's visit she discusses extensively about her family stressors and how they are impacting her quality of life. She has regular appointments with Dr. Adele Schilder as well that she finds very helpful.   Pain Inventory Average Pain 2 Pain Right Now 2 My pain is dull and aching  In the last 24 hours, has pain interfered with the following? General activity 3 Relation with others 3 Enjoyment of life 3 What TIME of day is your pain at its worst? morning and night Sleep (in general) Good  Pain is worse with: bending and some activites Pain improves with: rest and medication Relief from Meds: 5   Family History  Problem Relation Age of Onset  . Sleep apnea Father   . Alcohol abuse Father   . Diabetes Mother   . Diabetes Sister   . Diabetes Maternal Uncle   . Diabetes Cousin    Social History   Socioeconomic History  . Marital status: Divorced    Spouse name: Not on file  . Number of children: 3  . Years of education: 11  . Highest education level: Not on file  Occupational History  . Occupation: Retired  Tobacco Use  . Smoking status: Never Smoker  . Smokeless tobacco: Never Used  Vaping Use  . Vaping  Use: Never used  Substance and Sexual Activity  . Alcohol use: No    Alcohol/week: 0.0 standard drinks    Comment: zero  . Drug use: No    Types: Barbituates, Benzodiazepines    Comment: Denies any drug use other than benzos  . Sexual activity: Never    Birth control/protection: Abstinence  Other Topics Concern  . Not on file  Social History Narrative   Patient lives at home alone.   Caffeine Use:  2 cokes daily   Sister lives next door.   Social Determinants of Health   Financial Resource Strain: Not on file  Food Insecurity: Not on file  Transportation Needs: Not on file  Physical Activity: Not on file  Stress: Not on file  Social Connections: Not on file   Past Surgical History:   Procedure Laterality Date  . CESAREAN SECTION    . COSMETIC SURGERY    . ESOPHAGEAL MANOMETRY N/A 09/26/2016   Procedure: ESOPHAGEAL MANOMETRY (EM);  Surgeon: Clarene Essex, MD;  Location: [facility];  Service: Endoscopy;  Laterality: N/A;  . laproscopy     Past Medical History:  Diagnosis Date  . Anxiety   . Arthritis   . Depression   . Emotional depression 02/04/2015  . GERD (gastroesophageal reflux disease)   . High cholesterol   . Insomnia   . Memory loss    BP 131/64   Pulse 82   Temp 97.6 F (36.4 C)   Ht 5\' 4"  (1.626 m)   Wt 192 lb 3.2 oz (87.2 kg)   SpO2 98%   BMI 32.99 kg/m   Opioid Risk Score:   Fall Risk Score:  `1  Depression screen PHQ 2/9  Depression screen [facility] 2/9 11/30/2020 03/09/2020  Decreased Interest 3 0  Down, Depressed, Hopeless 3 0  PHQ - 2 Score 6 0  Some encounter information is confidential and restricted. Go to Review Flowsheets activity to see all data.  Some recent data might be hidden    Review of Systems  Constitutional: Positive for unexpected weight change.  Neurological: Positive for numbness.  Psychiatric/Behavioral: Positive for dysphoric mood. The patient is nervous/anxious.        Objective:   Physical Exam Gen: no distress, normal appearing HEENT: oral mucosa pink and moist, NCAT Cardio: Reg rate Chest: normal effort, normal rate of breathing Abd: soft, non-distended Ext: no edema Psych: pleasant, normal affect Skin: intact Neuro: Alert and oriented x3.  Musculoskeletal: 5/5 strength throughout except 4/5 on right hip abduction. Full flexion and extension, more difficulty with extension. Tenderness to palpation along bilateral SI joints.  Psych: pleasant, normal affect Assessment & Plan:   Kendra Simmons is a 70 year-old woman who presents today for follow-up of her lower back pain and bilateral greater trochanteric bursa pain syndrome.  1) Bilateral greater trochanteric pain syndrome: - Provided referral for PT  to focus on stretching and strengthening of the hip abductors, myofascial release, modalities, HEP  -Received two greater trochanteric bursa injections on 2/26 and 3/03 with excellent results. By now thise results have worn off. She continues to take the [facility]- she usually takes only when she has to sweep and mop. She uses voltaren gel. She does not have to use this very often.   -Provided renewal of Robaxin. Has enough Tizanidine. Increased dose of Lyrica to 75mg  for pain and insomnia. Use proper bed.   2) Insomnia: -She has been using the Tizanidine more often prescribed to help her sleep- advised to try  to use no more than three times per day.  -Increased Lyrica to 100mg  for pain, and this will also help with sleep.  -She plans to purchase the pregnancy  -Repeat LFTs on Thursday.    3) Prediabetes:  -Patient states she was recently diagnosed with diabetes Type 2. I have personally reviewed her labs and provided dietary and exercise advice. She has lost 13 lbs since our visit 3 months ago! Discussed her current diet.   4) Obesity: -Educated regarding health benefits of weight loss- for pain, general health, chronic disease prevention, immune health, mental health.  -Will monitor weight every visit.  -Consider Roobois tea daily.  -Discussed the benefits of intermittent fasting. -Discussed foods that can assist in weight loss:  1) Eggs  2) Leafy greens  3) Salmon  4) Cruciferous vegetables  5) Lean beef and chicken breast  6) Boiled potatoes  7) Tuna  8) Beans and legumes  9) Soups  10) Cottage cheese  11) Avocados  12) Apple cider vinegar  13) Nuts  14) Whole grains  15) Chili pepper  16) Fruit- berries are some of the best  17) Grapefruit  18) Chia seeds  19) Coconut oil  20) Full-fat yogurt  -Discussed supplements that can be used:  1) Metatrim 400mg  BID 30 minutes before breakfast and dinner  2) Sphaeranthus indicus and Garcinia mangostana (combinations of  these and #1 can be found in capsicum and zychrome  3) green coffee bean extract 400mg  twice per day or Irvingia (african mango) 150 to 300mg  twice per day.  Osteopenia: Vitamin D supplement, calcium supplement. Weight-bearing exercise. Continue Cubie to exercise while watching television.   B12 deficiency: Continue supplement.  General health:  --Recommended use of Down Dog app to do 15 min of daily yoga with her daughter. Advised that this will help with both hip and low back pain.  Recommended eating pain relieving foods and provided with a handout.  Recommended Roobois tea for its multiple benefits.   --Discussed family stressors extensively. Her anxiety and depressed mood given her family is a large component of increased inflammation and pain. Advised that she focus on the positive relationships in her life and the things that she enjoys to reduce her stress and grief. Discussed her recent stressor regarding a cat her daughter brought in that is stressing her other cats.   -She has thought a lot about getting back to work. 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 Encouraged her to do this! I think that is a wonderful idea!  RTC in 3 months to assess progress with above interventions.   40 minutes spent in discussion of her bilateral greater trochanteric pain syndrome, history of pain attacks, depression and pain medications, sending her refills, providing dietary advise, increasing Lyrica.

## 2021-02-01 ENCOUNTER — Telehealth (HOSPITAL_COMMUNITY): Payer: Self-pay | Admitting: *Deleted

## 2021-02-01 NOTE — Telephone Encounter (Signed)
Referral faxed to [facility] via referral line. Pt is being referred for hypnagogic hallucinations and seizure-like activities. Pt never heard from [facility].

## 2021-02-03 ENCOUNTER — Other Ambulatory Visit: Payer: Self-pay | Admitting: Family Medicine

## 2021-02-03 DIAGNOSIS — N839 Noninflammatory disorder of ovary, fallopian tube and broad ligament, unspecified: Secondary | ICD-10-CM

## 2021-02-07 ENCOUNTER — Other Ambulatory Visit: Payer: Self-pay | Admitting: Family Medicine

## 2021-02-07 DIAGNOSIS — Z1231 Encounter for screening mammogram for malignant neoplasm of breast: Secondary | ICD-10-CM

## 2021-02-14 ENCOUNTER — Other Ambulatory Visit: Payer: Self-pay

## 2021-02-14 ENCOUNTER — Encounter: Payer: 59 | Attending: Physical Medicine and Rehabilitation | Admitting: Physical Medicine and Rehabilitation

## 2021-02-14 ENCOUNTER — Encounter: Payer: Self-pay | Admitting: Physical Medicine and Rehabilitation

## 2021-02-14 ENCOUNTER — Telehealth: Payer: Self-pay | Admitting: Physical Medicine and Rehabilitation

## 2021-02-14 VITALS — Ht 64.0 in | Wt 192.0 lb

## 2021-02-14 DIAGNOSIS — M7918 Myalgia, other site: Secondary | ICD-10-CM | POA: Diagnosis not present

## 2021-02-14 DIAGNOSIS — E669 Obesity, unspecified: Secondary | ICD-10-CM | POA: Insufficient documentation

## 2021-02-14 DIAGNOSIS — M7062 Trochanteric bursitis, left hip: Secondary | ICD-10-CM | POA: Diagnosis not present

## 2021-02-14 DIAGNOSIS — M7061 Trochanteric bursitis, right hip: Secondary | ICD-10-CM | POA: Insufficient documentation

## 2021-02-14 DIAGNOSIS — Z6832 Body mass index (BMI) 32.0-32.9, adult: Secondary | ICD-10-CM | POA: Insufficient documentation

## 2021-02-14 NOTE — Telephone Encounter (Signed)
Can you please open up 9:45 today for phone visit with her to discuss?

## 2021-02-14 NOTE — Telephone Encounter (Signed)
Kendra Simmons states her pain is excruciating in her neck shoulders, back and hips. She states she is taking her muscle relaxer's and not getting relief

## 2021-02-14 NOTE — Progress Notes (Signed)
Subjective:    Patient ID: Kendra Simmons, female    DOB: [birth year], 70 y.o.   MRN: [id number]  HPI  Due to national recommendations of social distancing because of COVID 22, an audio/video tele-health visit is felt to be the most appropriate encounter for this patient at this time. See MyChart message from today for the patient's consent to a tele-health encounter with [place]. This is a follow up tele-visit via phone. The patient is at home. MD is at office.   Kendra Simmons is a 70 year old woman who presents for follow-up of diffuse chronic pain.   Since last visit, she has still been having bilateral hip pain secondary to greater trochanteric pain syndrome. Her pain is also present in her hip joint, scapula.   1) Bilateral greater trochanteric pain syndrome -She has been taking Robaxin -She has been using voltaren gel -She has ordered a pillow to help offload her lateral hips- she will get this 3/10  2) Cervical myofascial pain syndrome: -muscles feel very tight near her scapula and upper back -She has been using heating pad every day.  -She notes poor posture.  -She has had injections in her upper back before.   3) Obesity: -She got down to 180 and climbed back up again to 192.  -BMI is 32.96 -She has been considering lap band surgery. She is not sure if she would be approved for this.  -She plans to walk more in the summer.  3) Prediabetes  4) HLD  5) Depression: She takes Wellbutrin.   Prior history:  She does not eat anything until lunch time. She eats a lot of strawberries and lunch. She feels she needs to lay off the bread. Her daughter bakes pizza and then it is hard for her to resist this. She eats a lot of broccoli ad stir fired vegetables. She drinks lemonade. She went to the dentist and was advised not to eat tea. If she eats a big lunch she does not eat much supper.   She takes probiotics and omeprazole.    Prior history:  Kendra Simmons is a 70 year-old woman who presents today for follow-up of her lower back pain and bilateral hip pain. Her pain is usually well controlled but she often has spasms during the day and before she sleeps at night. She has been taking Tizanidine 4mg  HS and this has been helping her. She has Tramadol prescribed but has been taking this sparingly and does not feel much relief from it. Once when she had additional spasms during the day she took the Tizanidine and felt very sleepy afterward She has done therapy in the past and does not want to again at this time, though she knows she needs to exercise more. She used to walk in her church, but it is closed to walking now due to the pandemic.  Since last visit I performed bilateral greater trochancteric bursa injections and her pain has decreased from 6 to 4. Her Lyrica dose has been decreased by Dr. Adele Schilder, her psychiatrist, to avoid polypharmacy, and she feels this change has increased her pain and insomnia.    During today's visit she discusses extensively about her family stressors and how they are impacting her quality of life. She has regular appointments with Dr. Adele Schilder as well that she finds very helpful.   Pain Inventory Average Pain 9 Pain Right Now 4 My pain is dull and aching  In the last 24  hours, has pain interfered with the following? General activity 9 Relation with others 9 Enjoyment of life 9 What TIME of day is your pain at its worst? morning and night Sleep (in general) Good  Pain is worse with: bending and some activites Pain improves with: rest and medication Relief from Meds: 4   Family History  Problem Relation Age of Onset  . Sleep apnea Father   . Alcohol abuse Father   . Diabetes Mother   . Diabetes Sister   . Diabetes Maternal Uncle   . Diabetes Cousin    Social History   Socioeconomic History  . Marital status: Divorced    Spouse name: Not on file  . Number of children: 3  .  Years of education: 40  . Highest education level: Not on file  Occupational History  . Occupation: Retired  Tobacco Use  . Smoking status: Never Smoker  . Smokeless tobacco: Never Used  Vaping Use  . Vaping Use: Never used  Substance and Sexual Activity  . Alcohol use: No    Alcohol/week: 0.0 standard drinks    Comment: zero  . Drug use: No    Types: Barbituates, Benzodiazepines    Comment: Denies any drug use other than benzos  . Sexual activity: Never    Birth control/protection: Abstinence  Other Topics Concern  . Not on file  Social History Narrative   Patient lives at home alone.   Caffeine Use:  2 cokes daily   Sister lives next door.   Social Determinants of Health   Financial Resource Strain: Not on file  Food Insecurity: Not on file  Transportation Needs: Not on file  Physical Activity: Not on file  Stress: Not on file  Social Connections: Not on file   Past Surgical History:  Procedure Laterality Date  . CESAREAN SECTION    . COSMETIC SURGERY    . ESOPHAGEAL MANOMETRY N/A 09/26/2016   Procedure: ESOPHAGEAL MANOMETRY (EM);  Surgeon: Clarene Essex, MD;  Location: [facility];  Service: Endoscopy;  Laterality: N/A;  . laproscopy     Past Medical History:  Diagnosis Date  . Anxiety   . Arthritis   . Depression   . Emotional depression 02/04/2015  . GERD (gastroesophageal reflux disease)   . High cholesterol   . Insomnia   . Memory loss    Ht 5\' 4"  (1.626 m) Comment: unknown, virtual visit  Wt 192 lb (87.1 kg) Comment: unknown, virtual visit  BMI 32.96 kg/m   Opioid Risk Score:   Fall Risk Score:  `1  Depression screen PHQ 2/9  Depression screen [facility] 2/9 11/30/2020 03/09/2020  Decreased Interest 3 0  Down, Depressed, Hopeless 3 0  PHQ - 2 Score 6 0  Some encounter information is confidential and restricted. Go to Review Flowsheets activity to see all data.  Some recent data might be hidden    Review of Systems  Constitutional: Positive for  unexpected weight change.  Musculoskeletal: Positive for myalgias and neck pain.       Shoulder pain Scapula pain   Neurological: Positive for numbness.  Psychiatric/Behavioral: Positive for dysphoric mood. The patient is nervous/anxious.   All other systems reviewed and are negative.      Objective:   Physical Exam  Assessment & Plan:   Kendra Simmons is a 70 year-old woman who presents today for follow-up of her lower back pain and bilateral greater trochanteric bursa pain syndrome.  1) Bilateral greater trochanteric pain syndrome: - Provided referral for  PT to focus on stretching and strengthening of the hip abductors, myofascial release, modalities, HEP  -She would like to repeat bilateral hip steroid injections tomorrow- discussed with Lattie Haw and no Josem Kaufmann is required.  -Currently worse in the right.   -Received two greater trochanteric bursa injections on 2/26 and 3/03 with excellent results. By now thise results have worn off. She continues to take the [facility]- she usually takes only when she has to sweep and mop. She uses voltaren gel. She does not have to use this very often.   -Continue Robaxin. Has enough Tizanidine. Increased dose of Lyrica to 75mg  for pain and insomnia. Use proper bed.   -Discussed that it best to avoid opioids, even tramadol, for chronic pain due to the risk of tolerance and dependence.   2) Insomnia: -She has been using the Tizanidine more often prescribed to help her sleep- advised to try to use no more than three times per day.  -Increased Lyrica to 100mg  for pain, and this will also help with sleep.  -She plans to purchase the pregnancy  -Repeat LFTs on w/ labs with PCP.   3) Prediabetes:  -Patient states she was recently diagnosed with diabetes Type 2. I have personally reviewed her labs and provided dietary and exercise advice. She has lost 13 lbs since our visit 3 months ago! Discussed her current diet.  -Continue Trulicity to help curb  her appetite.   4) Obesity: -Educated regarding health benefits of weight loss- for pain, general health, chronic disease prevention, immune health, mental health.  -Will monitor weight every visit.  -Consider Roobois tea daily.  -Discussed the benefits of intermittent fasting. -Discussed foods that can assist in weight loss:  1) Eggs  2) Leafy greens  3) Salmon  4) Cruciferous vegetables  5) Lean beef and chicken breast  6) Boiled potatoes  7) Tuna  8) Beans and legumes  9) Soups  10) Cottage cheese  11) Avocados  12) Apple cider vinegar  13) Nuts  14) Whole grains  15) Chili pepper  16) Fruit- berries are some of the best  17) Grapefruit  18) Chia seeds  19) Coconut oil  20) Full-fat yogurt  -Discussed supplements that can be used:  1) Metatrim 400mg  BID 30 minutes before breakfast and dinner  2) Sphaeranthus indicus and Garcinia mangostana (combinations of these and #1 can be found in capsicum and zychrome  3) green coffee bean extract 400mg  twice per day or Irvingia (african mango) 150 to 300mg  twice per day.  5) Osteopenia: Vitamin D supplement, calcium supplement. Weight-bearing exercise. Continue Cubie to exercise while watching television.   6) B12 deficiency: Continue supplement.  7) General health:  --Recommended use of Down Dog app to do 15 min of daily yoga with her daughter. Advised that this will help with both hip and low back pain.  Recommended eating pain relieving foods and provided with a handout.  Recommended Roobois tea for its multiple benefits.   --Discussed family stressors extensively. Her anxiety and depressed mood given her family is a large component of increased inflammation and pain. Advised that she focus on the positive relationships in her life and the things that she enjoys to reduce her stress and grief. Discussed her recent stressor regarding a cat her daughter brought in that is stressing her other cats.   -She has thought a lot  about getting back to work. 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 Encouraged her to do  this! I think that is a wonderful idea!  8) Cervical myofascial pain syndrome: -Continue heat -She would like to try trigger point injections in future.   20 minutes spent in discussion of pain (hip, neck, upper back), discussion of dates of prior injections, discussion of initiating PT for hip, postural correction, plan for repeat steroid injections this week, trigger point injections in future.

## 2021-02-15 ENCOUNTER — Telehealth: Payer: Self-pay | Admitting: *Deleted

## 2021-02-15 NOTE — Telephone Encounter (Signed)
I was hoping to get her in tomorrow instead when I will be in the clinic but she told Renae her home is being remodeled then- I can come to clinic today to do bilateral hip injections for her if today works for her.

## 2021-02-15 NOTE — Telephone Encounter (Signed)
Mrs Lovern called and said that she talked with Dr Ranell Patrick yesterday and she was going to try and get her on the schedule today for some shots for her back.  Please advise.

## 2021-02-16 ENCOUNTER — Ambulatory Visit (HOSPITAL_COMMUNITY): Payer: 59 | Admitting: Clinical

## 2021-02-16 ENCOUNTER — Ambulatory Visit: Payer: 59 | Admitting: Physical Medicine and Rehabilitation

## 2021-02-18 ENCOUNTER — Other Ambulatory Visit: Payer: Self-pay | Admitting: Orthopaedic Surgery

## 2021-02-21 ENCOUNTER — Telehealth: Payer: Self-pay

## 2021-02-21 NOTE — Telephone Encounter (Signed)
Can you please clarify with her which Rx she wants? When I spoke with her last we had discussed her coming in for injections, thank you!

## 2021-02-21 NOTE — Telephone Encounter (Signed)
Patient informed. 

## 2021-02-21 NOTE — Telephone Encounter (Signed)
Dr. Ranell Patrick is not in the office today. Patient has been informed.   Kendra Simmons called: "I am in a lot of lower back and hip pain. I need someone to send in a Rx for my pain today.   Patient advised to call her PCP or see an urgent care today. If she need immediate  therapy. A message will be sent to Dr. Ranell Patrick.   Call back phone [phone] or [phone].

## 2021-02-21 NOTE — Telephone Encounter (Signed)
Patient needs pain contract and urine sample in order to receive controlled substance. She can move current appointment earlier if she would like to

## 2021-02-21 NOTE — Telephone Encounter (Signed)
She is taking all prescribed medications nothing is helping. Patient requested Tramadol or something else.

## 2021-02-23 ENCOUNTER — Ambulatory Visit: Payer: 59

## 2021-03-02 ENCOUNTER — Ambulatory Visit (INDEPENDENT_AMBULATORY_CARE_PROVIDER_SITE_OTHER): Payer: 59 | Admitting: Neurology

## 2021-03-02 ENCOUNTER — Encounter: Payer: Self-pay | Admitting: Neurology

## 2021-03-02 VITALS — BP 152/87 | HR 94 | Ht 64.0 in | Wt 188.0 lb

## 2021-03-02 DIAGNOSIS — M2619 Other specified anomalies of jaw-cranial base relationship: Secondary | ICD-10-CM | POA: Diagnosis not present

## 2021-03-02 DIAGNOSIS — G2111 Neuroleptic induced parkinsonism: Secondary | ICD-10-CM

## 2021-03-02 DIAGNOSIS — F99 Mental disorder, not otherwise specified: Secondary | ICD-10-CM | POA: Insufficient documentation

## 2021-03-02 DIAGNOSIS — G478 Other sleep disorders: Secondary | ICD-10-CM | POA: Insufficient documentation

## 2021-03-02 DIAGNOSIS — F330 Major depressive disorder, recurrent, mild: Secondary | ICD-10-CM

## 2021-03-02 DIAGNOSIS — F5105 Insomnia due to other mental disorder: Secondary | ICD-10-CM

## 2021-03-02 DIAGNOSIS — G4753 Recurrent isolated sleep paralysis: Secondary | ICD-10-CM

## 2021-03-02 MED ORDER — CLONAZEPAM 0.5 MG PO TABS: 0.5000 mg | ORAL_TABLET | Freq: Every day | ORAL | 0 refills | Status: DC | PRN

## 2021-03-02 NOTE — Progress Notes (Signed)
SLEEP MEDICINE CLINIC    Provider:  Larey Seat, MD  Primary Care Physician:  [name], [name] [address]     Referring Provider: Dr Adele Schilder.         Chief Complaint according to patient   Patient presents with:    . New Patient (Initial Visit)           HISTORY OF PRESENT ILLNESS:  Kendra Simmons is a 70 y.o.  Caucasian female and chronic insomnia patient who has been seen at [facility] in 2016-17. She is now re-referred for a sleep consultation referral on 03/02/2021 from Dr Adele Schilder. She is a retired Marine scientist.  The patient is treated by psychiatry and pain management.  Patient is followed by Dr. Gilford Rile for pain management and reports that she has quite a bit of different joint pain conditions.  She has chronic insomnia probably starting in her teenage, long before she had pain but the pain seems to contribute to her sleep initiation and sleep maintenance problems now.  For the last 5 months she has felt that she has more often sleep paralysis.  She will wake but she cannot move her extremities or her head- and this this has happened only when she first fell asleep in her recliner or easy chair.  Waking up from sleep there? She has tried called out to her daughter and couldn't form the words.  A second concern is expressed as kaleidoscopic visual hallucinations or visions.  Things seem to sway through the visual field sometimes coming closer sometimes going further away and these are scary visual sites.  Those happens while she is in her bed not every night but she cannot go to sleep when these  happen. She laways wakes up frequently, but these visual hallucination have no sound to them and she cannot  Identify people she sees, they are dancing - without sound,  she sees also animals-  this is very disturbing to her. Her eye exam was normal, no history of migraines.     Chief concern according to patient :   Pt alone, rm 10. Presents for the last  5 months off/on she is having some sleep paralysis. She will feel like she can't move her head or arms. She will try and yell for her daughter but all the daughter will hear is moaning. She has noted visual type hallucinations as well. There have been no changes to her medications. She had taken ambien in past before and that cause hallucinations but when she stopped they went away. Last SS 2016. The sleep paralysis only happens when she falls asleep in the chair.      Other The visual hallucination that occurs happens while in her bed and intermittently. Not every night. Doesn't last long when it occurs but unable to pin point anything that could be causing this to happen.       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   The patient had the first sleep study 01-12-2015  with a result of an AHI ( Apnea Hypopnea index)  of 4.5/h  Sleep relevant medical history:  Vivid dreams, visual hallucinations, sleep paralysis.  She had auditory and visual hallucinations on Ambien, now on Namenda.      Family medical /sleep history: No other family member  on CPAP with OSA, but many with  Insomnia,fibromyalgia.    Social history:  Patient is retired from Building surveyor and lives in a household with her daughter- She is driving.   Family status is divorced , with adult children, one son 37, daughters, one autistic daughter at age 6 in a group home in [place] and one living with her, 13.  The patient used to work in shifts( Presenter, broadcasting,) Pets are  Present. 4 cats.  Tobacco use; none.  ETOH use ; none ,  Caffeine intake ( none )  Regular exercise in form of stationary bike.     Sleep habits are as follows: The patient's dinner time is between 7 PM. The patient goes to bed at 10.30 PM and  continues to sleep for intervals of 45-120 minute duration,  Wakes from dreams and pain.   The preferred sleep position is variable , with the support of 1-2 pillows.  Dreams are reportedly very vivid.[name]   8-9AM is the usual rise time. The patient wakes up spontaneously.  She reports sometimes  not feeling refreshed or restored in AM, with symptoms such as dry mouth, headaches, joimt pain,  and residual fatigue.  Naps are not taken- rare and in a recliner.    Review of Systems: Out of a complete 14 system review, the patient complains of only the following symptoms, and all other reviewed systems are negative.:  Fatigue, sleepiness , snoring, fragmented sleep, Insomnia - chronic   Not desoriented, fluent speech.    How likely are you to doze in the following situations: 0 = not likely, 1 = slight chance, 2 = moderate chance, 3 = high chance   Sitting and Reading? Watching Television? Sitting inactive in a public place (theater or meeting)? As a passenger in a car for an hour without a break? Lying down in the afternoon when circumstances permit? Sitting and talking to someone? Sitting quietly after lunch without alcohol? In a car, while stopped for a few minutes in traffic?   Total = 2 / 24 points   FSS endorsed at 20/ 63 points.   GDS 5/ 15 points, anxiety, insomnia.   Social History   Socioeconomic History  . Marital status: Divorced    Spouse name: Not on file  . Number of children: 3  . Years of education: 4  . Highest education level: Not on file  Occupational History  . Occupation: Retired  Tobacco Use  . Smoking status: Never Smoker  . Smokeless tobacco: Never Used  Vaping Use  . Vaping Use: Never used  Substance and Sexual Activity  . Alcohol use: No    Alcohol/week: 0.0 standard drinks    Comment: zero  . Drug use: No    Types: Barbituates, Benzodiazepines    Comment: Denies any drug use other than benzos  . Sexual activity: Never    Birth  control/protection: Abstinence  Other Topics Concern  . Not on file  Social History Narrative   Patient lives at home alone.   Caffeine Use:  2 cokes daily   Sister lives next door.   Social Determinants of Health   Financial Resource Strain: Not on file  Food Insecurity: Not on file  Transportation Needs: Not on file  Physical Activity: Not on file  Stress: Not on file  Social Connections: Not on file    Family History  Problem Relation Age of Onset  . Sleep apnea Father   . Alcohol abuse Father   . Diabetes  Mother   . Diabetes Sister   . Diabetes Maternal Uncle   . Diabetes Cousin     Past Medical History:  Diagnosis Date  . Anxiety   . Arthritis   . Depression   . Emotional depression 02/04/2015  . GERD (gastroesophageal reflux disease)   . High cholesterol   . Insomnia   . Memory loss     Past Surgical History:  Procedure Laterality Date  . CESAREAN SECTION    . COSMETIC SURGERY    . ESOPHAGEAL MANOMETRY N/A 09/26/2016   Procedure: ESOPHAGEAL MANOMETRY (EM);  Surgeon: Clarene Essex, MD;  Location: [facility];  Service: Endoscopy;  Laterality: N/A;  . laproscopy       Current Outpatient Medications on File Prior to Visit  Medication Sig Dispense Refill  . acetaminophen (TYLENOL) 500 MG tablet Take 500 mg by mouth every 4 (four) hours as needed for mild pain.    [name] buPROPion (WELLBUTRIN XL) 150 MG 24 hr tablet TAKE 1 TABLET BY MOUTH EVERY DAY IN THE MORNING 90 tablet 0  . Calcium Carbonate (CALCIUM 600 PO) Take 1 tablet by mouth daily.    . clonazePAM (KLONOPIN) 0.5 MG tablet Take 1 tablet (0.5 mg total) by mouth daily as needed for anxiety. 90 tablet 0  . Cyanocobalamin (VITAMIN B 12 PO) Take 1 tablet by mouth daily.    . diclofenac Sodium (VOLTAREN) 1 % GEL APPLY 2 GRAMS TO AFFECTED AREA 4 TIMES A DAY 300 g 3  . doxepin (SINEQUAN) 75 MG capsule Take 1 capsule (75 mg total) by mouth at bedtime. 90 capsule 0  . Dulaglutide (TRULICITY) 5.80 DX/8.3JA SOPN Inject  into the skin.    . fluticasone (FLONASE) 50 MCG/ACT nasal spray Place 2 sprays into both nostrils daily. 16 g 0  . ibuprofen (ADVIL,MOTRIN) 200 MG tablet Take 400 mg by mouth every 4 (four) hours as needed for moderate pain.     [name] lisinopril (ZESTRIL) 2.5 MG tablet Take 2.5 mg by mouth daily.    . memantine (NAMENDA) 10 MG tablet Take 1 tablet (10 mg total) by mouth 2 (two) times daily. 60 tablet 12  . methocarbamol (ROBAXIN) 500 MG tablet Take 1 tablet (500 mg total) by mouth every 6 (six) hours as needed for muscle spasms. 30 tablet 3  . Multiple Vitamins-Minerals (MULTIVITAMIN PO) Take 1 tablet by mouth daily.    . Omega-3 Fatty Acids (OMEGA-3 FISH OIL PO) Take by mouth daily.    [name] omeprazole (PRILOSEC) 40 MG capsule Take 40 mg by mouth daily.   3  . ondansetron (ZOFRAN ODT) 4 MG disintegrating tablet Take 1 tablet (4 mg total) by mouth every 8 (eight) hours as needed for nausea or vomiting. 20 tablet 0  . polyethylene glycol (MIRALAX / GLYCOLAX) 17 g packet Take 17 g by mouth daily.    . pregabalin (LYRICA) 100 MG capsule Take 1 capsule (100 mg total) by mouth at bedtime. 30 capsule 2  . Probiotic Product (PROBIOTIC PO) Take 1 tablet by mouth daily.    . QUEtiapine (SEROQUEL) 400 MG tablet Take 1 tablet (400 mg total) by mouth at bedtime. 30 tablet 2  . ramelteon (ROZEREM) 8 MG tablet Take 1 tablet (8 mg total) by mouth at bedtime. 30 tablet 2  . rosuvastatin (CRESTOR) 20 MG tablet Take 20 mg by mouth daily.    . sucralfate (CARAFATE) 1 GM/10ML suspension Take 10 mLs (1 g total) by mouth 4 (four) times daily -  with  meals and at bedtime. 420 mL 0  . tamsulosin (FLOMAX) 0.4 MG CAPS capsule Take 1 capsule (0.4 mg total) by mouth daily after supper. For frequent urination/urgency 15 capsule 0  . tiZANidine (ZANAFLEX) 4 MG tablet Take 4 mg by mouth at bedtime.     No current facility-administered medications on file prior to visit.    Allergies  Allergen Reactions  . Phentermine Anxiety  .  Augmentin [Amoxicillin-Pot Clavulanate] Diarrhea    Physical exam:  Today's Vitals   03/02/21 1535  BP: (!) 152/87  Pulse: 94  Weight: 188 lb (85.3 kg)  Height: '5\' 4"'  (1.626 m)   Body mass index is 32.27 kg/m.   Wt Readings from Last 3 Encounters:  03/02/21 188 lb (85.3 kg)  02/14/21 192 lb (87.1 kg)  01/31/21 192 lb 3.2 oz (87.2 kg)     Ht Readings from Last 3 Encounters:  03/02/21 '5\' 4"'  (1.626 m)  02/14/21 '5\' 4"'  (1.626 m)  01/31/21 '5\' 4"'  (1.626 m)      General: The patient is awake, alert and appears not in acute distress. The patient is well groomed. Head: Normocephalic, atraumatic. Neck is supple.  Mallampati 3,  neck circumference: 14.5  inches . Nasal airflow barely patent. Status post rhinoplasty,  Retrognathia is prominent . Dental status: crowded, small mouth.  Cardiovascular:  Regular rate and cardiac rhythm by pulse,  without distended neck veins. Respiratory: Lungs are clear to auscultation.  Skin:  Without evidence of ankle edema, or rash. Trunk: The patient's posture is erect.   Neurologic exam : The patient is awake and alert, oriented to place and time.   Memory subjective described as intact.  Attention span & concentration ability appears normal.  Speech is fluent,  with dysphonia   Mood and affect are appropriate.   Cranial nerves: no loss of smell or taste reported  Pupils are equal and briskly reactive to light. Funduscopic exam deferred.   Extraocular movements in vertical and horizontal planes were intact and without nystagmus. No Diplopia. Visual fields by finger perimetry are intact. Hearing was intact to soft voice and finger rubbing.   Facial sensation intact to fine touch.  Facial motor strength is symmetric and tongue and uvula move midline.  Neck ROM : rotation, tilt and flexion extension were normal for age and shoulder shrug was symmetrical.    Motor exam:  Symmetric bulk, tone and ROM.   Normal tone without cog wheeling, symmetric  grip strength .   Sensory:  Fine touch, pinprick and vibration were tested  and  normal.  Proprioception tested in the upper extremities was normal.   Coordination: Rapid alternating movements in the fingers/hands were of normal speed.  The Finger-to-nose maneuver was intact without evidence of ataxia, dysmetria or tremor.   Gait and station: Patient could rise unassisted from a seated position, walked without assistive device.  Stance is of normal width/ base . Toe and heel walk were deferred.  Deep tendon reflexes: in the  upper and lower extremities are symmetric and intact.  Babinski response was deferred.      After spending a total time of 45 minutes face to face and additional time for physical and neurologic examination, review of laboratory studies,  personal review of imaging studies, reports and results of other testing and review of referral information / records as far as provided in visit, I have established the following assessments:  Kendra Simmons was very severely depressed when I had seen her last time.  She has a lifelong history of insomnia, but most recently there have been changes that are bothering her including hypnagogic visual hallucinations sometimes she has been unable to call out when she is alert falls or just waking from a nap.  She is not always sure if these are actually sleep related issues because she is not always feeling that she can perceive when she is asleep and when not.  So her sleep seems to be fragmented and sometimes very nonrestorative but the most threatening and recent experiences over the last 2 to 3 months have been visual.  She has had visions of a kaleidoscope of people colors animals moving in her visual field seemingly coming closer going away again but these are visions without any auditory stimulation.  The people that she sees cannot be identified, they do not respond to any kind of verbal stimulus and they dance to a music that nobody can  hear.  The sleep pattern has self changed sometimes she just saw colors or zigzag patterns just before going to sleep.  There has been no change in the medication and she stated that she first have some changes in her sleep pattern probably due to a unexpected long some Christmas but she could not be with her family cooks at home rather hard.  She feels her head is patient is stable on the geriatric depression score she is endorsed 5 out of 15 points, she has been without any paranoid ideas or suicidal thoughts and her living situation has been unchanged.  She was inpatient admitted in June 2016 because of hallucinations paranoid ideation depression and she has been tried on Xanax, amitriptyline, imipramine, temazepam, trazodone, Zyprexa, Luvox, Wellbutrin, Vistaril and Prozac Remeron and Belsomra she is currently on ramelteon which is a melatonin agonist.  The high doses of Seroquel have a potential of causing increased muscle tone or tremors sometimes even jerky mo vements.  However she has not been seen to display any of these here.  Her finger-to-nose test shows no tremor at all no ataxia no dysmetria and her gait is steady.  Dr. Adele Schilder had this encounter with the patient on February 01, 2021 in a virtual visit nonface-to-face.  He then requested a sleep study.    I would love for the patient to come in for an attended sleep study and follow-up well aware that he may not capture a lot of sleep quantitatively I think we have a good chance due to the frequency of events, that she will experience some of this while she is here.  So I do not want her to change any medication and I would like to order an expanded EEG attended polysomnography.  If there should be any kind of periodic limb movement disorder during REM sleep I would certainly talk to Dr. Adele Schilder about treating her with some higher dose of clonazepam.      My Plan is to proceed with:  1) I would love for the patient to come in for an attended sleep  study and follow-up well aware that he may not capture a lot of sleep quantitatively I think we have a good chance due to the frequency of events, that she will experience some of this while she is here.  So I do not want her to change any medication and I would like to order an expanded EEG attended polysomnography.  If there should be any kind of periodic limb movement disorder during REM sleep I would certainly talk to  Dr. Adele Schilder about treating her with some higher dose of clonazepam.    The patient is not willing to undergo an attended PSG , and she would like a HST. She feels she would not be able to sleep anyway.  I like to obtain a HLA narcolepsy test, due to sleep paralysis and hallucination. I would like to thank [name], MD and [name], Md [address] [place],  [place] [address] for allowing me to meet with and to take care of this pleasant patient.   In short, Kendra Simmons is presenting with hypnagogic hypnopompic hallucinations but no excessive daytime sleepiness. I plan to follow up prn either personally or through our NP within 3 month.   CC: I will share my notes with Psychiatrist Dr Adele Schilder,   Electronically signed by: Larey Seat, MD 03/02/2021 3:48 PM  [facility] Board certified by The [employer] of Sleep Medicine and Diplomate of the [employer] of Sleep Medicine. Board certified In Neurology through the [employer], Fellow of the [employer] of Neurology. Medical Director of [facility].

## 2021-03-06 ENCOUNTER — Telehealth: Payer: Self-pay | Admitting: Neurology

## 2021-03-06 NOTE — Telephone Encounter (Signed)
The genetic testing can up to 5 business days. As soon as I have the results, I will contact the patient.

## 2021-03-06 NOTE — Telephone Encounter (Signed)
Pt. is asking for an update from blood work. Please advise. May call home phone if unable to reach on cell.

## 2021-03-09 ENCOUNTER — Other Ambulatory Visit: Payer: Self-pay | Admitting: Neurology

## 2021-03-09 LAB — NARCOLEPSY EVALUATION
DQA1*01:02: POSITIVE
DQB1*06:02: POSITIVE

## 2021-03-09 NOTE — Progress Notes (Signed)
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 Insurance will still not accept this test as diagnostic. We have to go through with MSLT ( thus far the gold standard) or get the new hypo cretin test.  It will be impossible to safely remove all REM sleep Supressant medication.  Awaiting update on hypocretin test labs in [place].

## 2021-03-10 NOTE — Telephone Encounter (Signed)
FYI: Pt called, checking the results of my blood work,  I Deneise Lever) informed patient of previous telephone note; nurse will notify her as soon as we have the results.

## 2021-03-12 ENCOUNTER — Other Ambulatory Visit (HOSPITAL_COMMUNITY): Payer: Self-pay | Admitting: Psychiatry

## 2021-03-12 DIAGNOSIS — F330 Major depressive disorder, recurrent, mild: Secondary | ICD-10-CM

## 2021-03-13 NOTE — Telephone Encounter (Signed)
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 Informed that we are attempting to find out more information on a lab [facility]. Informed the patient once I have more information on that I would be in contact with her.  "2 HLA markers for narcolepsy are positive. Insurance will still not accept this test as diagnostic. We have to go through with MSLT ( thus far the gold standard) or get the new hypo cretin test.  It will be impossible to safely remove all REM sleep Supressant medication.   Awaiting update on hypocretin test labs in [place]. "   

## 2021-03-15 ENCOUNTER — Telehealth: Payer: Self-pay

## 2021-03-15 NOTE — Telephone Encounter (Signed)
Tried calling home and mobil number this morning to schedule sleep study that was ordered by Dr. Brett Fairy.  Unable to lvm due to both mailbox full.   We have attempted to call the patient two times to schedule sleep study.  Patient has been unavailable at the phone numbers we have on file and has not returned our calls.  If patient calls back we will schedule them for their sleep study.  Unable to send mychart message due to pt doesn't not have it activated at this time.

## 2021-03-17 NOTE — Telephone Encounter (Signed)
Unfortunately after attempting to contact people and confirm there is not a blood test that can be done.  Only the lumbar puncture that looks at CSF fluid.  Dr. Brett Fairy does not feel that it is in the patient's best interest to do this but would offer to the patient if she felt the need to get confirmation.

## 2021-03-18 ENCOUNTER — Other Ambulatory Visit (HOSPITAL_COMMUNITY): Payer: Self-pay | Admitting: Psychiatry

## 2021-03-18 DIAGNOSIS — F330 Major depressive disorder, recurrent, mild: Secondary | ICD-10-CM

## 2021-03-18 DIAGNOSIS — F411 Generalized anxiety disorder: Secondary | ICD-10-CM

## 2021-03-20 ENCOUNTER — Ambulatory Visit: Payer: 59

## 2021-03-20 NOTE — Telephone Encounter (Signed)
Called the patient to discuss further. There was no answer. Unable to LVM due to VM was full.   **If pt returns call please advise that after further investigation, Dr Dohmeier was told incorrectly. There is not a blood test that we can can do to "confirm" diagnosis of narcolepsy. We can schedule a follow up after her home sleep test is completed.

## 2021-03-21 ENCOUNTER — Other Ambulatory Visit: Payer: Self-pay | Admitting: Family Medicine

## 2021-03-21 ENCOUNTER — Ambulatory Visit
Admission: RE | Admit: 2021-03-21 | Discharge: 2021-03-21 | Disposition: A | Discharge status: Home / Self Care | Payer: 59 | Source: Ambulatory Visit | Attending: Family Medicine | Admitting: Family Medicine

## 2021-03-21 DIAGNOSIS — R1032 Left lower quadrant pain: Secondary | ICD-10-CM

## 2021-03-21 MED ORDER — IOPAMIDOL (ISOVUE-300) INJECTION 61%
100.0000 mL | Freq: Once | INTRAVENOUS | Status: AC | PRN
  Administered 2021-03-21: 100 mL via INTRAVENOUS

## 2021-03-22 ENCOUNTER — Ambulatory Visit: Payer: 59 | Admitting: Physical Medicine and Rehabilitation

## 2021-03-22 ENCOUNTER — Telehealth (INDEPENDENT_AMBULATORY_CARE_PROVIDER_SITE_OTHER): Payer: 59 | Admitting: Psychiatry

## 2021-03-22 ENCOUNTER — Other Ambulatory Visit: Payer: Self-pay

## 2021-03-22 ENCOUNTER — Encounter (HOSPITAL_COMMUNITY): Payer: Self-pay | Admitting: Psychiatry

## 2021-03-22 DIAGNOSIS — F411 Generalized anxiety disorder: Secondary | ICD-10-CM

## 2021-03-22 DIAGNOSIS — F330 Major depressive disorder, recurrent, mild: Secondary | ICD-10-CM

## 2021-03-22 DIAGNOSIS — F5101 Primary insomnia: Secondary | ICD-10-CM | POA: Diagnosis not present

## 2021-03-22 MED ORDER — DOXEPIN HCL 75 MG PO CAPS: 75.0000 mg | ORAL_CAPSULE | Freq: Every day | ORAL | 0 refills | Status: DC

## 2021-03-22 MED ORDER — CLONAZEPAM 0.5 MG PO TABS: 0.5000 mg | ORAL_TABLET | Freq: Every day | ORAL | 0 refills | Status: DC | PRN

## 2021-03-22 MED ORDER — BUPROPION HCL ER (XL) 150 MG PO TB24: ORAL_TABLET | ORAL | 0 refills | Status: DC

## 2021-03-22 MED ORDER — QUETIAPINE FUMARATE 400 MG PO TABS: 400.0000 mg | ORAL_TABLET | Freq: Every day | ORAL | 2 refills | Status: DC

## 2021-03-22 NOTE — Progress Notes (Signed)
Virtual Visit via Telephone Note  I connected with Kendra Simmons on 03/22/21 at  2:40 PM EDT by telephone and verified that I am speaking with the correct person using two identifiers.  Location: Patient: Home Provider: Home Office   I discussed the limitations, risks, security and privacy concerns of performing an evaluation and management service by telephone and the availability of in person appointments. I also discussed with the patient that there may be a patient responsible charge related to this service. The patient expressed understanding and agreed to proceed.   History of Present Illness: Patient is evaluated by phone.  She is taking all her medication and she reported her symptoms are manageable.  She also saw neurologist Dr. Asencion Partridge and discussed in detail about her hypnagogic and sleep paralysis.  She was recommended to have extended sleep study which patient is reluctant because she had in the past and she could not sleep because of the noise.  However she had a HLA test which came positive.  She continues to have these hypnopompic hallucinations and sometimes she is worried about them.  Recently she also have some other health issues which she described pain in her gallbladder, diverticulitis but denies any suicidal thoughts, paranoia, panic attack or any crying spells.  Admitted concern is insomnia and these hallucinations which she describes colors and zigzag patterns.  Her living situation is good.  She is getting along with her daughter.  Her appetite is okay and her weight is stable.  She denies any anhedonia, panic attack.    Past Psychiatric History: H/Omultipleinpatient and IOP.LastinpatientJune 2016. H/O hallucination, paranoiaanddepression.Kerby Less, [name] and Dr. Thurmond Butts. TriedXanax, amitriptyline, imipramine, temazepam, trazodone, Zyprexa, Luvox, Wellbutrin, Prozac, Vistaril, Remeronand Bellsomra.  Recent Results (from the past 2160  hour(s))  NARCOLEPSY EVALUATION     Status: None   Collection Time: 03/02/21  4:33 PM  Result Value Ref Range   DQA1*01:02 Positive    DQB1*06:02 Positive     Comment: Final Results: DQA1*01:DWDXM,01:DWDXN DQB1*06:DWGKG,06:DWGKH Code Translation: DWDXM            01:02/01:08/01:09/01:11/01:16N/01:19/01:20                  /01:21/01:23/01:25/01:28/01:31/01:32/01:38                  /01:39/01:40Q/01:41/01:42/01:46/01:48                  /01:51/01:52/01:54/01:58/01:62/01:63/01:69                  /01:71/01:72/01:73/01:75 DWDXN            01:02/01:05/01:08/01:09/01:11/01:16N/01:19                  /01:20/01:21/01:23/01:25/01:28/01:31/01:32                  /01:38/01:39/01:40Q/01:41/01:42/01:46                  /01:48/01:51/01:52/01:54/01:58/01:62/01:63                  /01:69/01:71/01:72/01:73/01:75 DWGKG            06:02/06:03/06:41/06:44/06:46/06:47/06:72                  /06:73/06:74/06:80/06:84/06:90/06:92/06:95                  /62:94/76:546/50:354/65:681/27:517/00:174                  /94:496/75:916/38:466/59:935/70:177/93:903                  /  00:923/30:076/22:633/35:456/25:638/93:734                  /06:150/06:152/06:159/06:16 1/06:173/06:175                  /06:179N/06:183/06:185/06:188/06:192                  /06:197/06:200/06:211/06:213/06:215                  /06:216N/06:218/06:219/06:221/06:223                  (913) 209-8010                  /03:212/24:825/00:370/48:889/16:945/03:888                  /28:003/49:179/15:056/97:948/01:655/37:482                  /70:786/75:449/20:100/71:219/75:883/25:498                  /26:415/83:094/07:680/88:110/31:594/58:592                  /92:446/28:638/17:711/65:790/38:333O                  /06:308N/06:311/06:314/06:315/06:316                  /06:317N/06:324/06:326/06:327/06:328                  /06:329/06:331/06:333/06:334/06:335/06:336                  /06:338/06:341N/06:344/06:345N/06:346                   /32:919/16:606/00:459/97:741/42:395/32:023                  /06:363/06:364/06:365/06:366/06:367/06:370                  /34:356/86:168/37:290/21:115/52:080/22:336                  /12:244/97:530/05:110/21:117/35: 670/14:103                  /06:394N [place]            06:04/06:22/06:34/06:36/06:38/06:39/06:52                  /06:58/06:85/06:86/06:93/06:135/06:155                  /06:158N/06:160/06:164/06:171/06:186                  /01:314/38:887/57:972/82:060/15:615P                  /79:432/76:147/09:295/74:734/03:709/64:383                  /81:840/37:543/60:677/03:403/52:481/85:909                  /06:318/06:343/06:349/06:361/06:369/06:375                  /31:121/62:446 [place] interpretation for all loci based on IMGT/HLA database version 3.45.1 HLA Lab CLIA ID Number [id number]    Comment: Comment     Comment: This test was performed using Polymerase Chain Reaction/(PCR)Sequence Specific Oligonucleotide Probes (SSOP) (Luminex) technique. Sequence Based Typing (SBT) and/or Sequence Specific Primers (SSP) may be used as supplemental methods when necessary. Please contact HLA Customer Service at [phone] if you have any questions. Director of HLA Laboratory Dr Brooks Sailors, PhD    Please Note Comment     Comment: A correlation between HLA-DQ types and Narcolepsy-cataplexy has been well documented. However, these correlated HLA types are common in the normal population and are not  diagnostic for this disorder. A positive result for this test indicates the patient has these associated HLA alleles and in the presence of other symptoms may support a diagnosis of narcolepsy.     Psychiatric Specialty Exam: Physical Exam  Review of Systems  Weight 188 lb (85.3 kg).There is no height or weight on file to calculate BMI.  General Appearance: NA  Eye Contact:  NA  Speech:  Slow  Volume:  Normal  Mood:  Anxious  Affect:  NA  Thought Process:  Goal Directed   Orientation:  Full (Time, Place, and Person)  Thought Content:  Rumination  Suicidal Thoughts:  No  Homicidal Thoughts:  No  Memory:  Immediate;   Good Recent;   Good Remote;   Good  Judgement:  Intact  Insight:  Present  Psychomotor Activity:  NA  Concentration:  Concentration: Fair and Attention Span: Good  Recall:  Good  Fund of Knowledge:  Good  Language:  Good  Akathisia:  No  Handed:  Right  AIMS (if indicated):     Assets:  Communication Skills Desire for Improvement Housing Social Support  ADL's:  Intact  Cognition:  WNL  Sleep:   fair      Assessment and Plan: Major depressive disorder, recurrent.  Generalized anxiety disorder.  Primary insomnia  I reviewed notes from the neurologist and other provider.  She is taking multiple medication which are muscle relaxant, Lyrica, benzodiazepine, Rozerem, antidepressant and antipsychotic.  Patient is very reluctant to cut down the medication because she is afraid that she may not sleep well but also complaining of chronic insomnia and sleep paralysis with hallucinations.  I encouraged that she should do further work-up to rule out the etiology of her insomnia and recommended to do extended sleep studies and perhaps EEG.  I encouraged to keep appointment with neurologist for further work-up.  After some discussion she agreed to stop the Rozerem but like to keep the doxepin, Seroquel, Wellbutrin and Klonopin.  Patient promised and agreed to give a call to neurologist.  I will also send my notes to her neurologist Dr. Mervin Hack.  For now continue Seroquel 400 mg at bedtime, Klonopin 0.5 mg as needed, Wellbutrin XL 150 mg in the morning and doxepin 75 mg at bedtime.  I am okay if neurologist wants to try higher dose of Klonopin to help insomnia and then she can follow-up with them.  Recommend to call us back if she is any question or any concern.  Discussed medication side effects.  Follow-up in 3 months.  Follow Up  Instructions:    I discussed the assessment and treatment plan with the patient. The patient was provided an opportunity to ask questions and all were answered. The patient agreed with the plan and demonstrated an understanding of the instructions.   The patient was advised to call back or seek an in-person evaluation if the symptoms worsen or if the condition fails to improve as anticipated.  I provided 32 minutes of non-face-to-face time during this encounter.   [name], MD

## 2021-03-24 ENCOUNTER — Encounter (HOSPITAL_COMMUNITY): Payer: Self-pay

## 2021-03-24 ENCOUNTER — Other Ambulatory Visit: Payer: Self-pay

## 2021-03-24 ENCOUNTER — Emergency Department (HOSPITAL_COMMUNITY)
Admission: EM | Admit: 2021-03-24 | Discharge: 2021-03-24 | Disposition: A | Discharge status: Home / Self Care | Payer: 59 | Attending: Emergency Medicine | Admitting: Emergency Medicine

## 2021-03-24 DIAGNOSIS — R002 Palpitations: Secondary | ICD-10-CM | POA: Diagnosis not present

## 2021-03-24 DIAGNOSIS — R42 Dizziness and giddiness: Secondary | ICD-10-CM | POA: Diagnosis present

## 2021-03-24 DIAGNOSIS — Z79899 Other long term (current) drug therapy: Secondary | ICD-10-CM | POA: Insufficient documentation

## 2021-03-24 DIAGNOSIS — R110 Nausea: Secondary | ICD-10-CM | POA: Diagnosis not present

## 2021-03-24 DIAGNOSIS — R109 Unspecified abdominal pain: Secondary | ICD-10-CM | POA: Diagnosis not present

## 2021-03-24 LAB — CBC WITH DIFFERENTIAL/PLATELET
Abs Immature Granulocytes: 0.03 10*3/uL (ref 0.00–0.07)
Basophils Absolute: 0 10*3/uL (ref 0.0–0.1)
Basophils Relative: 0 %
Eosinophils Absolute: 0.1 10*3/uL (ref 0.0–0.5)
Eosinophils Relative: 2 %
HCT: 36.7 % (ref 36.0–46.0)
Hemoglobin: 11.8 g/dL — ABNORMAL LOW (ref 12.0–15.0)
Immature Granulocytes: 1 %
Lymphocytes Relative: 19 %
Lymphs Abs: 0.9 10*3/uL (ref 0.7–4.0)
MCH: 31.3 pg (ref 26.0–34.0)
MCHC: 32.2 g/dL (ref 30.0–36.0)
MCV: 97.3 fL (ref 80.0–100.0)
Monocytes Absolute: 0.4 10*3/uL (ref 0.1–1.0)
Monocytes Relative: 9 %
Neutro Abs: 3.3 10*3/uL (ref 1.7–7.7)
Neutrophils Relative %: 69 %
Platelets: 132 10*3/uL — ABNORMAL LOW (ref 150–400)
RBC: 3.77 MIL/uL — ABNORMAL LOW (ref 3.87–5.11)
RDW: 12.9 % (ref 11.5–15.5)
WBC: 4.7 10*3/uL (ref 4.0–10.5)
nRBC: 0 % (ref 0.0–0.2)

## 2021-03-24 LAB — COMPREHENSIVE METABOLIC PANEL
ALT: 19 U/L (ref 0–44)
AST: 23 U/L (ref 15–41)
Albumin: 4.3 g/dL (ref 3.5–5.0)
Alkaline Phosphatase: 44 U/L (ref 38–126)
Anion gap: 7 (ref 5–15)
BUN: 16 mg/dL (ref 8–23)
CO2: 26 mmol/L (ref 22–32)
Calcium: 9.3 mg/dL (ref 8.9–10.3)
Chloride: 110 mmol/L (ref 98–111)
Creatinine, Ser: 0.94 mg/dL (ref 0.44–1.00)
GFR, Estimated: >60 mL/min (ref >60)
Glucose, Bld: 124 mg/dL — ABNORMAL HIGH (ref 70–99)
Potassium: 3.9 mmol/L (ref 3.5–5.1)
Sodium: 143 mmol/L (ref 135–145)
Total Bilirubin: 0.2 mg/dL — ABNORMAL LOW (ref 0.3–1.2)
Total Protein: 6.9 g/dL (ref 6.5–8.1)

## 2021-03-24 LAB — CBG MONITORING, ED
Glucose-Capillary: 108 mg/dL — ABNORMAL HIGH (ref 70–99)
Glucose-Capillary: 121 mg/dL — ABNORMAL HIGH (ref 70–99)

## 2021-03-24 MED ORDER — SODIUM CHLORIDE 0.9 % IV BOLUS
1000.0000 mL | Freq: Once | INTRAVENOUS | Status: AC
  Administered 2021-03-24: 1000 mL via INTRAVENOUS

## 2021-03-24 NOTE — ED Provider Notes (Signed)
[facility] DEPT Provider Note   CSN: [id number] Arrival date & time: 03/24/21  1313     History Chief Complaint  Patient presents with  . Near Syncope    Kendra Simmons is a 70 y.o. female.  HPI 70 year old female presents with palpitations and lightheadedness.  She has been feeling these palpitations for over a month.  However seems to be worse recently and now she is having the lightheadedness.  Is lightheaded worse when she is standing or sitting.  She wonders if her blood pressure is dropping when she stands.  Over the last period of time she has also had some abdominal discomfort and had a CT a couple days ago that showed uncomplicated diverticulitis.  Is on Cipro and Flagyl and the abdominal pain feels better.  Some nausea and decreased p.o. intake.  No headache, chest pain, shortness of breath.  No focal weakness.  Her heart sometimes feels like it skipping beats but mostly she can feel it beating stronger. The lightheadedness has been going on for a few months here and there but is worse over the past several days. No urinary symptoms.   Past Medical History:  Diagnosis Date  . Anxiety   . Arthritis   . Depression   . Emotional depression 02/04/2015  . GERD (gastroesophageal reflux disease)   . High cholesterol   . Insomnia   . Memory loss     Patient Active Problem List   Diagnosis Date Noted  . 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  . Sleep paralysis, 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  . 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 Sepsis without acute organ dysfunction ([place])   . Regurgitation of stomach contents   . Esophageal dysphagia   . Community acquired pneumonia of right upper lobe of lung 12/08/2018  . Chronic bilateral low back pain 07/01/2017  . Dehydration 07/03/2015  . UTI (lower urinary tract infection) 07/03/2015  . Hypokalemia 07/03/2015  . AKI (acute kidney injury) ([place]) 07/03/2015  .  Tachycardia 05/27/2015  . MDD (major depressive disorder), recurrent, severe, with psychosis ([place]) 05/25/2015  . 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 Insomnia disorder, with non-sleep disorder mental comorbidity, 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  . 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 Snoring 12/24/2014  . Unintended weight loss 12/24/2014  . 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 Panic attacks 07/26/2014  . Thrombocytopenia ([place]) 12/17/2012  . DYSLIPIDEMIA 03/13/2010  . GERD 03/13/2010    Past Surgical History:  Procedure Laterality Date  . CESAREAN SECTION    . COSMETIC SURGERY    . ESOPHAGEAL MANOMETRY N/A 09/26/2016   Procedure: ESOPHAGEAL MANOMETRY (EM);  Surgeon: Clarene Essex, MD;  Location: [facility] END[facility]COPY;  Service: Endoscopy;  Laterality: N/A;  . laproscopy       OB History   No obstetric history on file.     Family History  Problem Relation Age of Onset  . Sleep apnea Father   . Alcohol abuse Father   . Diabetes Mother   . Diabetes Sister   . Diabetes Maternal Uncle   . Diabetes Cousin     Social History   Tobacco Use  . Smoking status: Never Smoker  . Smokeless tobacco: Never Used  Vaping Use  . Vaping Use: Never used  Substance Use Topics  . Alcohol use: No    Alcohol/week: 0.0 standard drinks    Comment: zero  . Drug use: No    Types: Barbituates, Benzodiazepines    Comment: Denies any drug use other than benzos  Home Medications Prior to Admission medications   Medication Sig Start Date End Date Taking? Authorizing Provider  acetaminophen (TYLENOL) 500 MG tablet Take 500 mg by mouth every 4 (four) hours as needed for mild pain.    [provider]  buPROPion (WELLBUTRIN XL) 150 MG 24 hr tablet TAKE 1 TABLET BY MOUTH EVERY DAY IN THE MORNING 03/22/21   Arfeen, Arlyce Harman, MD  Calcium Carbonate (CALCIUM 600 PO) Take 1 tablet by mouth daily.    [provider]  clonazePAM (KLONOPIN) 0.5 MG tablet Take 1 tablet (0.5 mg total) by mouth daily as needed  for anxiety. 03/22/21   Arfeen, Arlyce Harman, MD  Cyanocobalamin (VITAMIN B 12 PO) Take 1 tablet by mouth daily.    [provider]  diclofenac Sodium (VOLTAREN) 1 % GEL APPLY 2 GRAMS TO AFFECTED AREA 4 TIMES A DAY 02/20/21   Mcarthur Rossetti, MD  doxepin (SINEQUAN) 75 MG capsule Take 1 capsule (75 mg total) by mouth at bedtime. 03/22/21   Arfeen, Arlyce Harman, MD  Dulaglutide (TRULICITY) 3.23 FT/7.3UK SOPN Inject into the skin.    [provider]  fluticasone (FLONASE) 50 MCG/ACT nasal spray Place 2 sprays into both nostrils daily. 02/24/20   Joy, Shawn C, PA-C  ibuprofen (ADVIL,MOTRIN) 200 MG tablet Take 400 mg by mouth every 4 (four) hours as needed for moderate pain.     [provider]  lisinopril (ZESTRIL) 2.5 MG tablet Take 2.5 mg by mouth daily. 01/24/21   [provider]  memantine (NAMENDA) 10 MG tablet Take 1 tablet (10 mg total) by mouth 2 (two) times daily. 06/02/15   Rankin, Shuvon B, NP  methocarbamol (ROBAXIN) 500 MG tablet Take 1 tablet (500 mg total) by mouth every 6 (six) hours as needed for muscle spasms. 01/31/21   Raulkar, Clide Deutscher, MD  Multiple Vitamins-Minerals (MULTIVITAMIN PO) Take 1 tablet by mouth daily.    [provider]  Omega-3 Fatty Acids (OMEGA-3 FISH OIL PO) Take by mouth daily.    [provider]  omeprazole (PRILOSEC) 40 MG capsule Take 40 mg by mouth daily.  09/06/16   [provider]  ondansetron (ZOFRAN ODT) 4 MG disintegrating tablet Take 1 tablet (4 mg total) by mouth every 8 (eight) hours as needed for nausea or vomiting. 02/24/20   Joy, Shawn C, PA-C  polyethylene glycol (MIRALAX / GLYCOLAX) 17 g packet Take 17 g by mouth daily.    [provider]  pregabalin (LYRICA) 100 MG capsule Take 1 capsule (100 mg total) by mouth at bedtime. 01/31/21   Raulkar, Clide Deutscher, MD  Probiotic Product (PROBIOTIC PO) Take 1 tablet by mouth daily.    [provider]  QUEtiapine (SEROQUEL) 400 MG tablet Take 1  tablet (400 mg total) by mouth at bedtime. 03/22/21   Arfeen, Arlyce Harman, MD  ramelteon (ROZEREM) 8 MG tablet Take 1 tablet (8 mg total) by mouth at bedtime. Patient not taking: Reported on 03/22/2021 12/21/20   Arfeen, Arlyce Harman, MD  rosuvastatin (CRESTOR) 20 MG tablet Take 20 mg by mouth daily. 01/26/21   [provider]  sucralfate (CARAFATE) 1 GM/10ML suspension Take 10 mLs (1 g total) by mouth 4 (four) times daily -  with meals and at bedtime. 12/11/18   Black, Lezlie Octave, NP  tamsulosin (FLOMAX) 0.4 MG CAPS capsule Take 1 capsule (0.4 mg total) by mouth daily after supper. For frequent urination/urgency 06/02/15   Rankin, Shuvon B, NP  tiZANidine (ZANAFLEX) 4 MG  tablet Take 4 mg by mouth at bedtime.    [provider]    Allergies    Phentermine and Augmentin [amoxicillin-pot clavulanate]  Review of Systems   Review of Systems  Constitutional: Negative for fever.  Respiratory: Negative for shortness of breath.   Cardiovascular: Positive for palpitations. Negative for chest pain.  Gastrointestinal: Positive for abdominal pain. Negative for vomiting.  Neurological: Positive for weakness and light-headedness. Negative for syncope and headaches.  All other systems reviewed and are negative.   Physical Exam Updated Vital Signs BP (!) 159/77   Pulse 76   Temp 97.8 F (36.6 C) (Oral)   Resp 20   SpO2 100%   Physical Exam Vitals and nursing note reviewed.  Constitutional:      General: She is not in acute distress.    Appearance: She is well-developed. She is not ill-appearing or diaphoretic.  HENT:     Head: Normocephalic and atraumatic.     Right Ear: External ear normal.     Left Ear: External ear normal.     Nose: Nose normal.  Eyes:     General:        Right eye: No discharge.        Left eye: No discharge.     Extraocular Movements: Extraocular movements intact.     Pupils: Pupils are equal, round, and reactive to light.  Cardiovascular:     Rate and Rhythm: Normal  rate and regular rhythm.     Heart sounds: Normal heart sounds.  Pulmonary:     Effort: Pulmonary effort is normal.     Breath sounds: Normal breath sounds.  Abdominal:     Palpations: Abdomen is soft.     Tenderness: There is no abdominal tenderness.  Skin:    General: Skin is warm and dry.  Neurological:     Mental Status: She is alert.     Comments: CN 3-12 grossly intact. 5/5 strength in all 4 extremities. Grossly normal sensation. Normal finger to nose.   Psychiatric:        Mood and Affect: Mood is not anxious.     ED Results / Procedures / Treatments   Labs (all labs ordered are listed, but only abnormal results are displayed) Labs Reviewed  COMPREHENSIVE METABOLIC PANEL - Abnormal; Notable for the following components:      Result Value   Glucose, Bld 124 (*)    Total Bilirubin 0.2 (*)    All other components within normal limits  CBC WITH DIFFERENTIAL/PLATELET - Abnormal; Notable for the following components:   RBC 3.77 (*)    Hemoglobin 11.8 (*)    Platelets 132 (*)    All other components within normal limits  CBG MONITORING, ED - Abnormal; Notable for the following components:   Glucose-Capillary 23 (*)    All other components within normal limits  CBG MONITORING, ED - Abnormal; Notable for the following components:   Glucose-Capillary 121 (*)    All other components within normal limits  CBG MONITORING, ED - Abnormal; Notable for the following components:   Glucose-Capillary 108 (*)    All other components within normal limits    EKG EKG Interpretation  Date/Time:  Friday March 24 2021 13:31:17 EDT Ventricular Rate:  76 PR Interval:  160 QRS Duration: 83 QT Interval:  382 QTC Calculation: 430 R Axis:   13 Text Interpretation: Sinus rhythm Abnormal R-wave progression, early transition LVH by voltage similar to Mar 2021 Confirmed by Sherwood Gambler [phone])  on 03/24/2021 1:57:31 PM   Radiology No results found.  Procedures Procedures   Medications  Ordered in ED Medications  sodium chloride 0.9 % bolus 1,000 mL (0 mLs Intravenous Stopped 03/24/21 1425)    ED Course  I have reviewed the triage vital signs and the nursing notes.  Pertinent labs & imaging results that were available during my care of the patient were reviewed by me and considered in my medical decision making (see chart for details).    MDM Rules/Calculators/A&P                          Patient's lightheadedness has been ongoing for quite some time.  She has palpitations but besides solitary and infrequent PVCs her telemetry is pretty benign.  Labs are unremarkable besides a mildly low hemoglobin though she states she has a history of this and has some B12 issues.  Her glucose was reported to be 23 but this was drawn from her EMS IV and when done by fingerstick it is over 100 both times.  I think it is a false value.  At this point, her abdominal exam is benign and so I think this is probably not related to her acute diverticulitis.  Continue to take the antibiotics and follow-up with PCP.  Probably needs Holter monitor as outpatient. Final Clinical Impression(s) / ED Diagnoses Final diagnoses:  Lightheadedness  Palpitations    Rx / DC Orders ED Discharge Orders    None       Sherwood Gambler, MD 03/24/21 1530

## 2021-03-24 NOTE — ED Triage Notes (Signed)
Coming from doctor's office, diagnosed with diverticulitis on Tuesday, on cipro and flagyl, complaining of orthostatic hypotension

## 2021-03-24 NOTE — ED Notes (Signed)
Orthostatic vital signs  Lying 145/70 HR 81  Sitting 155/64 HR 79  Standing 146/91 HR 99

## 2021-03-24 NOTE — ED Notes (Addendum)
Blood drawn from IV to run CBG on, repeat done on finger stick for normal level

## 2021-03-28 LAB — CBG MONITORING, ED: Glucose-Capillary: 23 mg/dL — CL (ref 70–99)

## 2021-03-28 NOTE — Progress Notes (Signed)
We can offer the CSF test if the MSLT is not in the future. The hypocretin test is still a CSF test, ( spinal tap)  not a blood test- unfortunately. I would set this up with [facility]  - CSF to be taken under fluoroscopy and send to [facility] or [facility].   Cc Dr [name] , MD.

## 2021-03-29 ENCOUNTER — Other Ambulatory Visit: Payer: Self-pay

## 2021-03-29 ENCOUNTER — Ambulatory Visit
Admission: RE | Admit: 2021-03-29 | Discharge: 2021-03-29 | Disposition: A | Discharge status: Home / Self Care | Payer: 59 | Source: Ambulatory Visit | Attending: Family Medicine | Admitting: Family Medicine

## 2021-03-29 ENCOUNTER — Other Ambulatory Visit: Payer: Self-pay | Admitting: Family Medicine

## 2021-03-29 DIAGNOSIS — R0600 Dyspnea, unspecified: Secondary | ICD-10-CM

## 2021-03-29 DIAGNOSIS — R0609 Other forms of dyspnea: Secondary | ICD-10-CM

## 2021-03-29 DIAGNOSIS — Z1231 Encounter for screening mammogram for malignant neoplasm of breast: Secondary | ICD-10-CM

## 2021-03-31 ENCOUNTER — Telehealth: Payer: Self-pay

## 2021-03-31 NOTE — Telephone Encounter (Signed)
Notes on file.

## 2021-04-03 ENCOUNTER — Telehealth: Payer: Self-pay | Admitting: Neurology

## 2021-04-03 DIAGNOSIS — G478 Other sleep disorders: Secondary | ICD-10-CM

## 2021-04-03 DIAGNOSIS — G4753 Recurrent isolated sleep paralysis: Secondary | ICD-10-CM

## 2021-04-03 DIAGNOSIS — M2619 Other specified anomalies of jaw-cranial base relationship: Secondary | ICD-10-CM

## 2021-04-03 NOTE — Telephone Encounter (Signed)
Called the patient.  There was no answer.  Unable to leave a voicemail due to voicemail box was full.   ** If the patient returns call, please advise that I did discuss with Dr. Brett Fairy her concerns and the recommendation at this point would be to complete the home sleep test that is ordered as well as a prolonged EEG.  This will allow the patient to be connected to the EEG machine and she would recommend the patient be in a recliner or inclined position in the bed  This would hopefully allow [place] to rule out if there is any seizure-like activity that could be causing.  Once this home sleep test is completed she will review those results.  Then we may have to pursue a daytime study test if the home sleep test is negative for sleep apnea.

## 2021-04-03 NOTE — Telephone Encounter (Signed)
Pt called, Thursday I had a sleep paralysis episode. I fell asleep in my chair. I woke up and could not move and I had a bad nightmare. Spoke with my psychiatry, he suggested I follow up with my neurologist. Would like call from the nurse.

## 2021-04-05 ENCOUNTER — Encounter: Payer: Self-pay | Admitting: Physical Medicine and Rehabilitation

## 2021-04-05 ENCOUNTER — Other Ambulatory Visit: Payer: Self-pay | Admitting: *Deleted

## 2021-04-05 ENCOUNTER — Encounter: Payer: 59 | Attending: Physical Medicine and Rehabilitation | Admitting: Physical Medicine and Rehabilitation

## 2021-04-05 ENCOUNTER — Other Ambulatory Visit: Payer: Self-pay

## 2021-04-05 ENCOUNTER — Other Ambulatory Visit: Payer: Self-pay | Admitting: Physical Medicine and Rehabilitation

## 2021-04-05 VITALS — BP 125/86 | HR 99 | Temp 97.9°F | Ht 64.0 in | Wt 187.0 lb

## 2021-04-05 DIAGNOSIS — M7918 Myalgia, other site: Secondary | ICD-10-CM | POA: Insufficient documentation

## 2021-04-05 MED ORDER — METHOCARBAMOL 500 MG PO TABS: 500.0000 mg | ORAL_TABLET | Freq: Four times a day (QID) | ORAL | 3 refills | Status: DC | PRN

## 2021-04-05 MED ORDER — AMOXICILLIN 250 MG PO CAPS: 250.0000 mg | ORAL_CAPSULE | Freq: Three times a day (TID) | ORAL | 0 refills | Status: DC

## 2021-04-05 MED ORDER — TIZANIDINE HCL 4 MG PO TABS: 4.0000 mg | ORAL_TABLET | Freq: Three times a day (TID) | ORAL | 0 refills | Status: DC

## 2021-04-05 MED ORDER — AMOXICILLIN 125 MG PO CHEW: 125.0000 mg | CHEWABLE_TABLET | Freq: Three times a day (TID) | ORAL | 0 refills | Status: DC

## 2021-04-05 NOTE — Progress Notes (Signed)
Trigger Point Injection  Indication: Myofascial pain not relieved by medication management and other conservative care.  Informed consent was obtained after describing risk and benefits of the procedure with the patient, this includes bleeding, bruising, infection and medication side effects.  The patient wishes to proceed and has given written consent.  The patient was placed in a seated position.  The cervical area was marked and prepped with Betadine.  It was entered with a 25-gauge 1/2 inch needle and a total of 5 mL of 1% lidocaine was injected into a total of 6 trigger points, after negative draw back for blood.  The patient tolerated the procedure well.  Post procedure instructions were given.

## 2021-04-05 NOTE — Addendum Note (Signed)
Addended by: Izora Ribas on: 04/05/2021 02:21 PM   Modules accepted: Orders

## 2021-04-11 NOTE — Telephone Encounter (Signed)
Pt was being scheduled for EEG and asked to speak with me. I was able to review with her that the MD confirmed there is no blood test to diagnose narcolepsy. There is only the MSLT and then the LP CSF testing. At this time Dr Dohmeier wanted to pt to complete EEG > hour to hopefully try and dose off in recliner and possibly catch the episode. She doesn't feel that will be something that she will do but states she will try. Advised Dr Dohmeier also wants the HST to be completed and our sleep lab had tried to contact her a couple times. I was able to transfer to the sleep lab to get scheduled for the HST. Pt verbalized understanding. Pt had no questions at this time but was encouraged to call back if questions arise.

## 2021-04-27 ENCOUNTER — Encounter: Payer: 59 | Attending: Physical Medicine and Rehabilitation | Admitting: Physical Medicine and Rehabilitation

## 2021-04-27 DIAGNOSIS — M7918 Myalgia, other site: Secondary | ICD-10-CM | POA: Insufficient documentation

## 2021-05-01 ENCOUNTER — Ambulatory Visit (INDEPENDENT_AMBULATORY_CARE_PROVIDER_SITE_OTHER): Payer: 59 | Admitting: Neurology

## 2021-05-01 DIAGNOSIS — G4733 Obstructive sleep apnea (adult) (pediatric): Secondary | ICD-10-CM

## 2021-05-01 DIAGNOSIS — F99 Mental disorder, not otherwise specified: Secondary | ICD-10-CM

## 2021-05-01 DIAGNOSIS — M2619 Other specified anomalies of jaw-cranial base relationship: Secondary | ICD-10-CM

## 2021-05-01 DIAGNOSIS — G2111 Neuroleptic induced parkinsonism: Secondary | ICD-10-CM

## 2021-05-01 DIAGNOSIS — G4753 Recurrent isolated sleep paralysis: Secondary | ICD-10-CM

## 2021-05-01 DIAGNOSIS — F5105 Insomnia due to other mental disorder: Secondary | ICD-10-CM

## 2021-05-01 DIAGNOSIS — G478 Other sleep disorders: Secondary | ICD-10-CM

## 2021-05-02 ENCOUNTER — Other Ambulatory Visit: Payer: Self-pay

## 2021-05-02 ENCOUNTER — Ambulatory Visit (INDEPENDENT_AMBULATORY_CARE_PROVIDER_SITE_OTHER): Payer: 59

## 2021-05-02 DIAGNOSIS — G478 Other sleep disorders: Secondary | ICD-10-CM

## 2021-05-02 DIAGNOSIS — M2619 Other specified anomalies of jaw-cranial base relationship: Secondary | ICD-10-CM

## 2021-05-02 DIAGNOSIS — G4753 Recurrent isolated sleep paralysis: Secondary | ICD-10-CM

## 2021-05-02 DIAGNOSIS — R4182 Altered mental status, unspecified: Secondary | ICD-10-CM

## 2021-05-04 NOTE — Progress Notes (Signed)
[facility] (Watch PAT)  STUDY DATE: 05-04-21  DOB: August 22, 1951  MRN: [id number]  ORDERING CLINICIAN: Larey Seat, MD   REFERRING CLINICIAN: Dr Adele Schilder  CLINICAL INFORMATION/HISTORY: Kendra Frigon Mattiellois a 70 y.o. Caucasian female and chronic insomnia patient,who has been seen at [facility] in 2016-17. She is now re-referred for a sleep consultation-   referralon 03/02/2021 from Dr Adele Schilder. She is a retired Marine scientist.  The patient is treated by psychiatry and pain management.  Patient is followed by Dr. Gilford Rile for pain management and reports that she has quite a bit of different joint pain conditions. She has chronic insomnia probably starting in her teenage, long before she had pain, but the pain seems to contribute to her sleep initiation and sleep maintenance problems now.    For the last 5 months she has felt that she has more often sleep paralysis.  She will wake but she cannot move her extremities or her head- and this this has happened only when she first fell asleep in her recliner or easy chair.  Waking up from sleep there? She has tried called out to her daughter and couldn't form the words.  A second concern is expressed as kaleidoscopic visual hallucinations or visions. Things seem to sway through the visual field sometimes coming closer sometimes going further away and these are scary visual sites. Those happens while she is in her bed not every night but she cannot go to sleep when these  happen. She wakes up frequently, but these purely visual hallucination have audio and she cannot  Identify people she sees, but they are dancing - without sound,  she sees also animals-this is very disturbing to her. Her eye exam was normal, no history of migraines.    Epworth sleepiness score: 2/24.  BMI: 32.0 kg/m  Neck Circumference: 14.5"  FINDINGS:   Total Record Time (hours, min): 6 h 25 min  Total Sleep Time (hours, min):  6 h 4 min   Percent REM  (%):    11.03 %   Calculated pAHI (per hour): 9.9       REM pAHI: 29.1    NREM pAHI: 7.6 Supine AHI: 9.3   Oxygen Saturation (%) Mean: 94  Minimum oxygen saturation (%):        88   O2 Saturation Range (%): 88-99  O2Saturation (minutes) <=88%: 0.1 min   Pulse Mean (bpm):    73  Pulse Range (66-95)   IMPRESSION: This HST confirmed the presence of mild OSA (obstructive sleep apnea), REM sleep dependent, without oxygen desaturations and with normal variability in heart rate. There was no positional exacerbation.   RECOMMENDATION: The patient reports no hypersomnia but Insomnia and sleep paralysis/  hypnagogic hallucinations.  The documented mild OSA is unlikely to contribute to these symptoms. Treatment of REM sleep dependent apnea is based on PAP - positive airway pressure. In order to further evaluate her hypnagogic hallucinations, a expanded EEG-PSG should follow after apnea has been treated.   INTERPRETING PHYSICIAN:  Larey Seat, MD    [facility] and [facility] Sleep Board certified by The [employer] of Sleep Medicine and Diplomate of the [employer] of Sleep Medicine. Board certified In Neurology through the [employer], Fellow of the [employer] of Neurology. Medical Director of [employer].  Sleep Summary  Oxygen Saturation Statistics   Start Study Time: End Study Time: Total Recording Time:         10:27:25 PM 4:52:36 AM  6 h, 25 min  Total Sleep Time % REM of Sleep Time:  6 h, 4 min  11.0    Mean: 94 Minimum: 88 Maximum: 99  Mean of Desaturations Nadirs (%):   91  Oxygen Desatur. %: 4-9 10-20 >20 Total  Events Number Total  29 100.0  0 0.0  0 0.0  29 100.0  Oxygen Saturation: <90 <=88 <85 <80 <70  Duration (minutes): Sleep % 0.2 0.1 0.1 0.0 0.0 0.0 0.0 0.0 0.0 0.0     Respiratory Indices      Total Events REM NREM All Night  pRDI: pAHI 3%: ODI 4%: pAHIc 3%: % CSR: pAHI 4%:  60  60  29  0 0.0 29 29.1 29.1 15.3 0.0  7.6 7.6 3.5 0.0 9.9 9.9 4.8 0.0  4.8       Pulse Rate Statistics during Sleep (BPM)      Mean: 73 Minimum: 66 Maximum: 95        Body Position Statistics  Position Supine Prone Right Left Non-Supine  Sleep (min) 244.2 0.0 1.0 119.0 120.0  Sleep % 67.0 0.0 0.3 32.7 33.0  pRDI 9.3 N/A N/A 10.7 11.1  pAHI 3% 9.3 N/A N/A 10.7 11.1  ODI 4% 4.4 N/A N/A 5.1 5.6     Snoring Statistics Snoring Level (dB) >40 >50 >60 >70 >80 >Threshold (45)  Sleep (min) 169.6 0.6 0.2 0.0 0.0 1.4  Sleep % 46.6 0.2 0.1 0.0 0.0 0.4    Mean: 41

## 2021-05-12 ENCOUNTER — Other Ambulatory Visit: Payer: Self-pay | Admitting: Physical Medicine and Rehabilitation

## 2021-05-16 NOTE — Progress Notes (Signed)
Conclusion:  This is a normal EEG for the patient's age and conscious state without evidence of hypersomnia as she was unable to initiate sleep within 47 minutes.

## 2021-05-16 NOTE — Procedures (Signed)
This EEG included hyperventilation maneuvers and photic stimulation maneuvers and was performed over a running time of 47 minutes 19 seconds.  This EEG was performed with the international 10-20 system of electrode placement.  Electric activity was acquired with a high and low frequency.  The EEG data were obtained synchronous 2 and 1-lead EKG electrode channel.  A video recording of this EEG is not available.  A posterior dominant rhythm at 9 Hz manifested with eye closure and promptly attenuated with eye opening.  The first maneuver was hyperventilation which increased the amplitude and also led to a slowing of the central areas between 6 and 7 Hz.  There were frequent episodes of eye blinking affecting the frontal polar region.  After 2 minutes of rest the EEG remained synchronous symmetric and without any epileptiform discharges.  Now photic stimulation was performed showing entrainment at 7, 9, 12, and 18 Hz.  Post photic stimulation there was a prolonged resting period but the patient did not enter sleep.   At the most there was evidence of drowsiness.    Conclusion:  This is a normal EEG for the patient's age and conscious state without evidence of hypersomnia as she was unable to initiate sleep within 47 minutes.  Larey Seat, MD

## 2021-05-17 ENCOUNTER — Telehealth: Payer: Self-pay | Admitting: Neurology

## 2021-05-17 ENCOUNTER — Other Ambulatory Visit: Payer: Self-pay | Admitting: Neurology

## 2021-05-17 DIAGNOSIS — G478 Other sleep disorders: Secondary | ICD-10-CM

## 2021-05-17 DIAGNOSIS — F5105 Insomnia due to other mental disorder: Secondary | ICD-10-CM

## 2021-05-17 DIAGNOSIS — G4753 Recurrent isolated sleep paralysis: Secondary | ICD-10-CM

## 2021-05-17 DIAGNOSIS — G4733 Obstructive sleep apnea (adult) (pediatric): Secondary | ICD-10-CM

## 2021-05-17 DIAGNOSIS — F99 Mental disorder, not otherwise specified: Secondary | ICD-10-CM

## 2021-05-17 NOTE — Progress Notes (Signed)
IMPRESSION: This HST confirmed the presence of mild OSA (obstructive sleep apnea), REM sleep dependent, without oxygen desaturations and with normal variability in heart rate. There was no positional exacerbation.   RECOMMENDATION: The patient reports no hypersomnia but Insomnia and sleep paralysis/  hypnagogic hallucinations.  The documented mild OSA is unlikely to contribute to these symptoms.  Treatment of REM sleep dependent apnea is based on PAP - positive airway pressure. In order to further evaluate her hypnagogic hallucinations, a expanded EEG-PSG should follow after apnea has been treated.   INTERPRETING PHYSICIAN:  Larey Seat, MD

## 2021-05-17 NOTE — Telephone Encounter (Signed)
I called pt. I advised pt that Dr. Brett Fairy reviewed their sleep study results and found that pt has mild to moderate OSA. Dr. Brett Fairy recommends that pt starts auto CPAP. I reviewed PAP compliance expectations with the pt. Pt is agreeable to starting a CPAP. I advised pt that an order will be sent to a DME, [facility] and [facility] ([facility] will call the pt within about one week after they file with the pt's insurance. [facility]) will show the pt how to use the machine, fit for masks, and troubleshoot the CPAP if needed. A follow up appt was made for insurance purposes with Dr. Brett Fairy on 09/06/2021 at 1:30 pm . Pt verbalized understanding to arrive 15 minutes early and bring their CPAP. A letter with all of this information in it will be mailed to the pt as a reminder. I verified with the pt that the address we have on file is correct. Pt verbalized understanding of results. Pt had no questions at this time but was encouraged to call back if questions arise. I have sent the order to [facility])  and have received confirmation that they have received the order.  Pt states that her and her psychiatrist MD have discussed her sleep aids and she is questioning if there is something that Dr Brett Fairy would recommend that may take the place of another med that may help her sleep.  Advised that she would most likely not want to add any medication considering her med list. Advised that usually she likes to see if the CPAP will help keep the patient asleep since the goal is that it will help alleviate the apnea's which could be causing her to wake. Advised I would route the phone note result to her psychiatrist and pt is requesting that Dr Brett Fairy and Dr Adele Schilder communicate together. Informed I would pass this long to her as well. Pt verbalized understanding.

## 2021-05-17 NOTE — Telephone Encounter (Signed)
Pt has called stating it has been weeks now and she would like to know if the results are now available from her EEG and home sleep study, please call.

## 2021-05-17 NOTE — Addendum Note (Signed)
Addended by: Larey Seat on: 05/17/2021 12:50 PM   Modules accepted: Orders

## 2021-05-17 NOTE — Procedures (Signed)
[facility] (Watch PAT)  STUDY DATE: 05-04-21  DOB: January 20, 1951  MRN: [id number]  ORDERING CLINICIAN: Larey Seat, MD   REFERRING CLINICIAN: Dr Adele Schilder  CLINICAL INFORMATION/HISTORY: Kendra Goodgame Mattiellois a 70 y.o. Caucasian female and chronic insomnia patient,who has been seen at [facility] in 2016-17. She is now re-referred for a sleep consultation-   referralon 03/02/2021 from Dr Adele Schilder. She is a retired Marine scientist.  The patient is treated by psychiatry and pain management.  Patient is followed by Dr. Gilford Rile for pain management and reports that she has quite a bit of different joint pain conditions. She has chronic insomnia probably starting in her teenage, long before she had pain, but the pain seems to contribute to her sleep initiation and sleep maintenance problems now.    For the last 5 months she has felt that she has more often sleep paralysis.  She will wake but she cannot move her extremities or her head- and this this has happened only when she first fell asleep in her recliner or easy chair.  Waking up from sleep there? She has tried called out to her daughter and couldn't form the words.  A second concern is expressed as kaleidoscopic visual hallucinations or visions. Things seem to sway through the visual field sometimes coming closer sometimes going further away and these are scary visual sites. Those happens while she is in her bed not every night but she cannot go to sleep when these  happen. She wakes up frequently, but these purely visual hallucination have audio and she cannot  Identify people she sees, but they are dancing - without sound,  she sees also animals-this is very disturbing to her. Her eye exam was normal, no history of migraines.    Epworth sleepiness score: 2/24.  BMI: 32.0 kg/m  Neck Circumference: 14.5"  FINDINGS:   Total Record Time (hours, min): 6 h 25 min  Total Sleep Time (hours, min):  6 h 4 min   Percent REM (%):     11.03 %   Calculated pAHI (per hour): 9.9       REM pAHI: 29.1    NREM pAHI: 7.6 Supine AHI: 9.3   Oxygen Saturation (%) Mean: 94  Minimum oxygen saturation (%):        88   O2 Saturation Range (%): 88-99  O2Saturation (minutes) <=88%: 0.1 min   Pulse Mean (bpm):    73  Pulse Range (66-95)   IMPRESSION: This HST confirmed the presence of mild OSA (obstructive sleep apnea), REM sleep dependent, without oxygen desaturations and with normal variability in heart rate. There was no positional exacerbation.   RECOMMENDATION: The patient reports no hypersomnia but Insomnia and sleep paralysis/  hypnagogic hallucinations.  The documented mild OSA is unlikely to contribute to these symptoms. Treatment of REM sleep dependent apnea is based on PAP - positive airway pressure. In order to further evaluate her hypnagogic hallucinations, a expanded EEG-PSG should follow after apnea has been treated.   INTERPRETING PHYSICIAN:  Larey Seat, MD    [facility] and [facility] Sleep Board certified by The [employer] of Sleep Medicine and Diplomate of the [employer] of Sleep Medicine. Board certified In Neurology through the [employer], Fellow of the [employer] of Neurology. Medical Director of [employer].  Sleep Summary  Oxygen Saturation Statistics   Start Study Time: End Study Time: Total Recording Time:         10:27:25 PM 4:52:36 AM 6  h, 25 min  Total Sleep Time % REM of Sleep Time:  6 h, 4 min  11.0    Mean: 94 Minimum: 88 Maximum: 99  Mean of Desaturations Nadirs (%):   91  Oxygen Desatur. %: 4-9 10-20 >20 Total  Events Number Total  29 100.0  0 0.0  0 0.0  29 100.0  Oxygen Saturation: <90 <=88 <85 <80 <70  Duration (minutes): Sleep % 0.2 0.1 0.1 0.0 0.0 0.0 0.0 0.0 0.0 0.0     Respiratory Indices      Total Events REM NREM All Night  pRDI: pAHI 3%: ODI 4%: pAHIc 3%: % CSR: pAHI 4%:  60  60  29  0 0.0 29 29.1 29.1 15.3 0.0  7.6 7.6 3.5 0.0 9.9 9.9 4.8 0.0  4.8       Pulse Rate Statistics during Sleep (BPM)      Mean: 73 Minimum: 66 Maximum: 95        Body Position Statistics  Position Supine Prone Right Left Non-Supine  Sleep (min) 244.2 0.0 1.0 119.0 120.0  Sleep % 67.0 0.0 0.3 32.7 33.0  pRDI 9.3 N/A N/A 10.7 11.1  pAHI 3% 9.3 N/A N/A 10.7 11.1  ODI 4% 4.4 N/A N/A 5.1 5.6     Snoring Statistics Snoring Level (dB) >40 >50 >60 >70 >80 >Threshold (45)  Sleep (min) 169.6 0.6 0.2 0.0 0.0 1.4  Sleep % 46.6 0.2 0.1 0.0 0.0 0.4

## 2021-05-17 NOTE — Telephone Encounter (Signed)
Called the pt and informed her of the normal EEG result. We also talked about the SSR (see other phone note). Pt verbalized understanding. Pt had no questions at this time but was encouraged to call back if questions arise.   Conclusion: This is a normal EEG for the patient's age and conscious state without evidence of hypersomnia as she was unable to initiate sleep within 47 minutes.

## 2021-05-17 NOTE — Telephone Encounter (Signed)
-----   Message from Larey Seat, MD sent at 05/17/2021 12:50 PM EDT ----- IMPRESSION: This HST confirmed the presence of mild OSA (obstructive sleep apnea), REM sleep dependent, without oxygen desaturations and with normal variability in heart rate. There was no positional exacerbation.   RECOMMENDATION: The patient reports no hypersomnia but Insomnia and sleep paralysis/  hypnagogic hallucinations.  The documented mild OSA is unlikely to contribute to these symptoms.  Treatment of REM sleep dependent apnea is based on PAP - positive airway pressure. In order to further evaluate her hypnagogic hallucinations, a expanded EEG-PSG should follow after apnea has been treated.   INTERPRETING PHYSICIAN:  Larey Seat, MD

## 2021-05-21 NOTE — Progress Notes (Deleted)
Cardiology Office Note:    Date:  05/21/2021   ID:  Kendra Simmons, DOB 26-Jun-1951, MRN [id number]  PCP:  [name], MD  Cardiologist:  None  Electrophysiologist:  None   Referring MD: [name], MD   No chief complaint on file. ***  History of Present Illness:    Kendra Simmons is a 70 y.o. female with a hx of T2DM, hypertension, hyperlipidemia who is referred by Dr. Mannie Stabile for evaluation of dyspnea and palpitations.  Past Medical History:  Diagnosis Date  . Anxiety   . Arthritis   . Depression   . Emotional depression 02/04/2015  . GERD (gastroesophageal reflux disease)   . High cholesterol   . Insomnia   . Memory loss     Past Surgical History:  Procedure Laterality Date  . CESAREAN SECTION    . COSMETIC SURGERY    . ESOPHAGEAL MANOMETRY N/A 09/26/2016   Procedure: ESOPHAGEAL MANOMETRY (EM);  Surgeon: Clarene Essex, MD;  Location: [facility];  Service: Endoscopy;  Laterality: N/A;  . laproscopy      Current Medications: No outpatient medications have been marked as taking for the 05/22/21 encounter (Appointment) with Donato Heinz, MD.     Allergies:   Phentermine and Augmentin [amoxicillin-pot clavulanate]   Social History   Socioeconomic History  . Marital status: Divorced    Spouse name: Not on file  . Number of children: 3  . Years of education: 64  . Highest education level: Not on file  Occupational History  . Occupation: Retired  Tobacco Use  . Smoking status: Never Smoker  . Smokeless tobacco: Never Used  Vaping Use  . Vaping Use: Never used  Substance and Sexual Activity  . Alcohol use: No    Alcohol/week: 0.0 standard drinks    Comment: zero  . Drug use: No    Types: Barbituates, Benzodiazepines    Comment: Denies any drug use other than benzos  . Sexual activity: Never    Birth control/protection: Abstinence  Other Topics Concern  . Not on file  Social History Narrative   Patient lives at home  alone.   Caffeine Use:  2 cokes daily   Sister lives next door.   Social Determinants of Health   Financial Resource Strain: Not on file  Food Insecurity: Not on file  Transportation Needs: Not on file  Physical Activity: Not on file  Stress: Not on file  Social Connections: Not on file     Family History: The patient's ***family history includes Alcohol abuse in her father; Diabetes in her cousin, maternal uncle, mother, and sister; Sleep apnea in her father.  ROS:   Please see the history of present illness.    *** All other systems reviewed and are negative.  EKGs/Labs/Other Studies Reviewed:    The following studies were reviewed today: ***  EKG:  EKG is *** ordered today.  The ekg ordered today demonstrates ***  Recent Labs: 03/24/2021: ALT 19; BUN 16; Creatinine, Ser 0.94; Hemoglobin 11.8; Platelets 132; Potassium 3.9; Sodium 143  Recent Lipid Panel    Component Value Date/Time   CHOL 177 11/04/2014 0650   TRIG 79 11/04/2014 0650   HDL 45 11/04/2014 0650   CHOLHDL 3.9 11/04/2014 0650   VLDL 16 11/04/2014 0650   LDLCALC 116 (H) 11/04/2014 0650    Physical Exam:    VS:  There were no vitals taken for this visit.    Wt Readings from Last 3 Encounters:  04/05/21 187  lb (84.8 kg)  03/02/21 188 lb (85.3 kg)  02/14/21 192 lb (87.1 kg)     GEN: *** Well nourished, well developed in no acute distress HEENT: Normal NECK: No JVD; No carotid bruits LYMPHATICS: No lymphadenopathy CARDIAC: ***RRR, no murmurs, rubs, gallops RESPIRATORY:  Clear to auscultation without rales, wheezing or rhonchi  ABDOMEN: Soft, non-tender, non-distended MUSCULOSKELETAL:  No edema; No deformity  SKIN: Warm and dry NEUROLOGIC:  Alert and oriented x 3 PSYCHIATRIC:  Normal affect   ASSESSMENT:    No diagnosis found. PLAN:       Medication Adjustments/Labs and Tests Ordered: Current medicines are reviewed at length with the patient today.  Concerns regarding medicines are  outlined above.  No orders of the defined types were placed in this encounter.  No orders of the defined types were placed in this encounter.   There are no Patient Instructions on file for this visit.   Signed, Donato Heinz, MD  05/21/2021 10:23 PM    [facility]

## 2021-05-22 ENCOUNTER — Ambulatory Visit: Payer: 59 | Admitting: Cardiology

## 2021-06-14 ENCOUNTER — Other Ambulatory Visit: Payer: Self-pay | Admitting: Orthopaedic Surgery

## 2021-06-21 ENCOUNTER — Telehealth (INDEPENDENT_AMBULATORY_CARE_PROVIDER_SITE_OTHER): Payer: 59 | Admitting: Psychiatry

## 2021-06-21 ENCOUNTER — Other Ambulatory Visit (HOSPITAL_COMMUNITY): Payer: Self-pay | Admitting: Psychiatry

## 2021-06-21 ENCOUNTER — Other Ambulatory Visit: Payer: Self-pay

## 2021-06-21 ENCOUNTER — Encounter (HOSPITAL_COMMUNITY): Payer: Self-pay | Admitting: Psychiatry

## 2021-06-21 DIAGNOSIS — F330 Major depressive disorder, recurrent, mild: Secondary | ICD-10-CM | POA: Diagnosis not present

## 2021-06-21 DIAGNOSIS — F411 Generalized anxiety disorder: Secondary | ICD-10-CM

## 2021-06-21 MED ORDER — QUETIAPINE FUMARATE 400 MG PO TABS: 400.0000 mg | ORAL_TABLET | Freq: Every day | ORAL | 2 refills | Status: DC

## 2021-06-21 MED ORDER — CLONAZEPAM 0.5 MG PO TABS: 0.5000 mg | ORAL_TABLET | Freq: Every day | ORAL | 0 refills | Status: DC | PRN

## 2021-06-21 MED ORDER — BUPROPION HCL ER (XL) 150 MG PO TB24: ORAL_TABLET | ORAL | 0 refills | Status: DC

## 2021-06-21 MED ORDER — DOXEPIN HCL 75 MG PO CAPS: 75.0000 mg | ORAL_CAPSULE | Freq: Every day | ORAL | 0 refills | Status: DC

## 2021-06-21 NOTE — Progress Notes (Signed)
Virtual Visit via Telephone Note  I connected with Charliene Inoue on 06/21/21 at  2:40 PM EDT by telephone and verified that I am speaking with the correct person using two identifiers.  Location: Patient: home Provider: home office   I discussed the limitations, risks, security and privacy concerns of performing an evaluation and management service by telephone and the availability of in person appointments. I also discussed with the patient that there may be a patient responsible charge related to this service. The patient expressed understanding and agreed to proceed.   History of Present Illness: Patient is evaluated by phone session.  She recently had a visit with neurologist for her sleep issues and rule out epilepsy.  Patient EEG was normal and her home sleep study shows mild sleep apnea.  She is now waiting for machine.  She has appointment in August discussed more about her symptoms.  She admitted lately more issues related to her family especially daughter.  She admitted sometime her relationship is "touch and go".  She realized she may need to start therapy as some of the issues cannot be resolved by medication.  Patient told that she accidentally met her older sister in a restaurant and she asked to share the table and she has no choice to sit down with her.  Patient admitted that dealing with her family issues require better coping skills and now she is seriously considering a therapist.  [facility] and she like to keep it.  She also wants to try different medication however I recommend she had tried many medication and currently taking a lot of psychotropic medication, I will suggest she should try therapy and to see if her coping skills get better.  She denies any crying spells, feeling of hopelessness, suicidal thoughts.  She is sleeping fair and she has chronic complaint of insomnia and despite multiple medication she does not feel  rested sleep.  She like to go up on Klonopin but I recommend she is taking doxepin, Seroquel, muscle relaxant and I we will not comfortable giving higher dose of benzodiazepine.  Patient understand and agree.  She has no tremor or shakes or any EPS.  Her appetite is okay.  Her weight is stable.  Past Psychiatric History:  H/O multiple inpatient and IOP. Last inpatient June 2016. H/O hallucination, paranoia and depression. Saw [name], [name] and Dr. Thurmond Butts.  Tried Xanax, amitriptyline, imipramine, temazepam, trazodone, Zyprexa, Luvox, Wellbutrin, Prozac, Vistaril, Remeron and Bellsomra.      Psychiatric Specialty Exam: Physical Exam  Review of Systems  Weight 187 lb (84.8 kg).There is no height or weight on file to calculate BMI.  General Appearance: NA  Eye Contact:  NA  Speech:  Slow  Volume:  Decreased  Mood:  Dysphoric  Affect:  NA  Thought Process:  Goal Directed  Orientation:  Full (Time, Place, and Person)  Thought Content:  Rumination  Suicidal Thoughts:  No  Homicidal Thoughts:  No  Memory:  Immediate;   Good Recent;   Fair Remote;   Good  Judgement:  Fair  Insight:  Shallow  Psychomotor Activity:  NA  Concentration:  Concentration: Fair and Attention Span: Fair  Recall:  [employer] of Knowledge:  Fair  Language:  Good  Akathisia:  No  Handed:  Right  AIMS (if indicated):     Assets:  Communication Skills Desire for Improvement Housing  ADL's:  Intact  Cognition:  WNL  Sleep:   fair      Assessment and Plan: Major depressive disorder, recurrent.  Generalized anxiety disorder.  Primary insomnia.  I reviewed notes from neurology, current medication.  I recommend she should get Gene psych testing before adding any new medication.  We will call the office to coordinate with the patient.  She agreed to keep the current medication until her next appointment.  I also encouraged strongly she should keep the appointment with a therapist which she finally agreed.   For now continue Wellbutrin XL 150 mg in the morning, doxepin 75 mg at bedtime, Klonopin 0.5 mg as needed and Seroquel 400 mg at bedtime.  Patient has upcoming appointment in August with a neurologist.  Recommended to call us back if she has any question or any concern.  Discussed polypharmacy and medication side effects.  Follow-up in 3 months.  Follow Up Instructions:    I discussed the assessment and treatment plan with the patient. The patient was provided an opportunity to ask questions and all were answered. The patient agreed with the plan and demonstrated an understanding of the instructions.   The patient was advised to call back or seek an in-person evaluation if the symptoms worsen or if the condition fails to improve as anticipated.  I provided 30 minutes of non-face-to-face time during this encounter.   [name], MD

## 2021-06-22 ENCOUNTER — Telehealth: Payer: Self-pay | Admitting: *Deleted

## 2021-06-22 ENCOUNTER — Telehealth (HOSPITAL_COMMUNITY): Payer: Self-pay | Admitting: *Deleted

## 2021-06-22 NOTE — Telephone Encounter (Signed)
Writer attempted to speak with pt regarding scheduling visit for [facility] testing. Both home and mobile numbers go to [facility] and both mailboxes are full. Will attempt again later.

## 2021-06-22 NOTE — Telephone Encounter (Signed)
Kendra Simmons called and reports that she is down in both of her hips and is hurting really bad.  She is asking if Dr Ranell Patrick can work her in for injections.  Please advise.

## 2021-06-27 ENCOUNTER — Other Ambulatory Visit: Payer: Self-pay

## 2021-06-27 ENCOUNTER — Ambulatory Visit (HOSPITAL_COMMUNITY): Payer: 59

## 2021-06-30 ENCOUNTER — Other Ambulatory Visit: Payer: Self-pay | Admitting: Physical Medicine and Rehabilitation

## 2021-07-04 ENCOUNTER — Encounter: Payer: 59 | Attending: Physical Medicine and Rehabilitation | Admitting: Physical Medicine and Rehabilitation

## 2021-07-04 ENCOUNTER — Other Ambulatory Visit: Payer: Self-pay

## 2021-07-04 ENCOUNTER — Encounter: Payer: Self-pay | Admitting: Physical Medicine and Rehabilitation

## 2021-07-04 VITALS — BP 150/83 | HR 93 | Temp 98.4°F | Ht 64.0 in | Wt 186.4 lb

## 2021-07-04 DIAGNOSIS — M7061 Trochanteric bursitis, right hip: Secondary | ICD-10-CM | POA: Diagnosis present

## 2021-07-04 DIAGNOSIS — M7062 Trochanteric bursitis, left hip: Secondary | ICD-10-CM | POA: Diagnosis present

## 2021-07-04 NOTE — Progress Notes (Signed)
Trochanteric bursa injection, bilateral  Indication: Trochanteric bursitis. Exam has tenderness over the greater trochanter of the hip. Pain has not responded to conservative care such as exercise therapy and oral medications. Pain interferes with sleep or with mobility Informed consent was obtained after describing risks and benefits of the procedure with the patient these include bleeding bruising and infection. Patient has signed written consent form. Patient placed in a lateral decubitus position with the affected hip superior. Point of maximal pain was palpated marked and prepped with Betadine and entered with a needle to bone contact. Needle slightly withdrawn then 6mg  of betamethasone with 4 cc 1% lidocaine were injected. Patient tolerated procedure well. Procedure was repeated for the other hip. Post procedure instructions given.

## 2021-07-06 ENCOUNTER — Ambulatory Visit: Payer: 59 | Admitting: Physical Medicine and Rehabilitation

## 2021-07-10 ENCOUNTER — Telehealth (HOSPITAL_COMMUNITY): Payer: Self-pay | Admitting: *Deleted

## 2021-07-10 NOTE — Telephone Encounter (Signed)
Pt called inquiring about GeneSight results. I faxed them to you. Also pt states that her therapist, Charlynne Pander, would like you to call her to discuss medication management. She may be reached @ [phone].

## 2021-07-10 NOTE — Telephone Encounter (Addendum)
I reviewed her Gene psych testing results and also spoke to her therapist Gaynelle Adu at [phone].  Her therapist was concerned about her partial response of the medication.  She also mentioned patient gets sometimes irritated and having a short fuse.  All her psychiatric medications are under favorable category.  Cymbalta, Luvox and mirtazapine are in the category that can cause significant interaction and patient is not taking these medication.  I discussed with the therapist that patient may consider ECT or [place] treatment.  We will discuss more on her next appointment.  Please inform the patient about her current medication not causing any significant interactions.

## 2021-07-10 NOTE — Telephone Encounter (Signed)
Ok will do.

## 2021-07-19 ENCOUNTER — Telehealth (HOSPITAL_COMMUNITY): Payer: Self-pay | Admitting: *Deleted

## 2021-07-19 NOTE — Telephone Encounter (Signed)
Pt called with c/o continuing insomnia r/t increased anxiety and rumination at bedtime. Pt states that she feels that she has hit a plateau with her medications. Pt says depression is "not bad" and under control with the Wellbutrin however anxiety is worse. Writer discussed sleep hygiene and asked pt if she's tried Melatonin, pt stated no but that she has tried Rozeram and it didn't work. Pt says sleeping only 2 hours qhs. Pt next appointment is on 09/21/21 and she would like to speak with you prior to that. Please review and advise. Thanks.

## 2021-07-21 ENCOUNTER — Encounter: Payer: 59 | Attending: Physical Medicine and Rehabilitation | Admitting: Physical Medicine and Rehabilitation

## 2021-07-21 ENCOUNTER — Telehealth: Payer: Self-pay

## 2021-07-21 ENCOUNTER — Other Ambulatory Visit: Payer: Self-pay

## 2021-07-21 DIAGNOSIS — F330 Major depressive disorder, recurrent, mild: Secondary | ICD-10-CM | POA: Diagnosis not present

## 2021-07-21 DIAGNOSIS — G4701 Insomnia due to medical condition: Secondary | ICD-10-CM | POA: Insufficient documentation

## 2021-07-21 DIAGNOSIS — M7062 Trochanteric bursitis, left hip: Secondary | ICD-10-CM

## 2021-07-21 DIAGNOSIS — F411 Generalized anxiety disorder: Secondary | ICD-10-CM

## 2021-07-21 DIAGNOSIS — M7061 Trochanteric bursitis, right hip: Secondary | ICD-10-CM | POA: Diagnosis not present

## 2021-07-21 NOTE — Progress Notes (Signed)
Subjective:    Patient ID: Kendra Simmons, female    DOB: 1951/12/14, 70 y.o.   MRN: [id number]  HPI  Due to national recommendations of social distancing because of COVID 57, an audio/video tele-health visit is felt to be the most appropriate encounter for this patient at this time. See MyChart message from today for the patient's consent to a tele-health encounter with [place]. This is a follow up tele-visit via phone. The patient is at home. MD is at office.    Kendra Simmons is a 70 year old woman who presents for follow-up of anxiety, pain, depression, ans insomnia.   Since last visit, she has still been having bilateral hip pain secondary to greater trochanteric pain syndrome. Her pain is also present in her hip joint, scapula. She has some benefit from the steroid injection.   1) Bilateral greater trochanteric pain syndrome -She has been taking Robaxin -She has been using voltaren gel -She has ordered a pillow to help offload her lateral hips- she will get this 3/10 -had some benefit with steroid injections  2) Cervical myofascial pain syndrome: -muscles feel very tight near her scapula and upper back -She has been using heating pad every day.  -She notes poor posture.  -She has had injections in her upper back before.   3) Obesity: -She got down to 180 and climbed back up again to 192.  -BMI is 32.96 -She has been considering lap band surgery. She is not sure if she would be approved for this.  -She plans to walk more in the summer.  3) Prediabetes  4) HLD  5) Depression:  -She takes Wellbutrin.  -she has been following with a psychiatrist  6) Anxiety: she has been ruminating a lot.   7) Insomnia:  -She has been taking Seroquel and it seems to have stopped helping.   Prior history:  She does not eat anything until lunch time. She eats a lot of strawberries and lunch. She feels she needs to lay off the bread. Her  daughter bakes pizza and then it is hard for her to resist this. She eats a lot of broccoli ad stir fired vegetables. She drinks lemonade. She went to the dentist and was advised not to eat tea. If she eats a big lunch she does not eat much supper.   She takes probiotics and omeprazole.   Prior history:  Kendra Simmons is a 70 year-old woman who presents today for follow-up of her lower back pain and bilateral hip pain. Her pain is usually well controlled but she often has spasms during the day and before she sleeps at night. She has been taking Tizanidine '4mg'$  HS and this has been helping her. She has Tramadol prescribed but has been taking this sparingly and does not feel much relief from it. Once when she had additional spasms during the day she took the Tizanidine and felt very sleepy afterward She has done therapy in the past and does not want to again at this time, though she knows she needs to exercise more. She used to walk in her church, but it is closed to walking now due to the pandemic.  Since last visit I performed bilateral greater trochancteric bursa injections and her pain has decreased from 6 to 4. Her Lyrica dose has been decreased by Dr. Adele Schilder, her psychiatrist, to avoid polypharmacy, and she feels this change has increased her pain and insomnia.    During today's visit  she discusses extensively about her family stressors and how they are impacting her quality of life. She has regular appointments with Dr. Adele Schilder as well that she finds very helpful.   Pain Inventory Average Pain 9 Pain Right Now 4 My pain is dull and aching  In the last 24 hours, has pain interfered with the following? General activity 9 Relation with others 9 Enjoyment of life 9 What TIME of day is your pain at its worst?  morning and night Sleep (in general) Good  Pain is worse with: bending and some activites Pain improves with: rest and medication Relief from Meds: 4   Family History  Problem  Relation Age of Onset   Sleep apnea Father    Alcohol abuse Father    Diabetes Mother    Diabetes Sister    Diabetes Maternal Uncle    Diabetes Cousin    Social History   Socioeconomic History   Marital status: Divorced    Spouse name: Not on file   Number of children: 3   Years of education: 14   Highest education level: Not on file  Occupational History   Occupation: Retired  Tobacco Use   Smoking status: Never   Smokeless tobacco: Never  Scientific laboratory technician Use: Never used  Substance and Sexual Activity   Alcohol use: No    Alcohol/week: 0.0 standard drinks    Comment: zero   Drug use: No    Types: Barbituates, Benzodiazepines    Comment: Denies any drug use other than benzos   Sexual activity: Never    Birth control/protection: Abstinence  Other Topics Concern   Not on file  Social History Narrative   Patient lives at home alone.   Caffeine Use:  2 cokes daily   Sister lives next door.   Social Determinants of Health   Financial Resource Strain: Not on file  Food Insecurity: Not on file  Transportation Needs: Not on file  Physical Activity: Not on file  Stress: Not on file  Social Connections: Not on file   Past Surgical History:  Procedure Laterality Date   CESAREAN SECTION     COSMETIC SURGERY     ESOPHAGEAL MANOMETRY N/A 09/26/2016   Procedure: ESOPHAGEAL MANOMETRY (EM);  Surgeon: Clarene Essex, MD;  Location: [facility];  Service: Endoscopy;  Laterality: N/A;   laproscopy     Past Medical History:  Diagnosis Date   Anxiety    Arthritis    Depression    Emotional depression 02/04/2015   GERD (gastroesophageal reflux disease)    High cholesterol    Insomnia    Memory loss    There were no vitals taken for this visit.  Opioid Risk Score:   Fall Risk Score:  `1  Depression screen PHQ 2/9  Depression screen [facility] 2/9 07/04/2021 11/30/2020 03/09/2020  Decreased Interest 3 3 0  Down, Depressed, Hopeless 3 3 0  PHQ - 2 Score 6 6 0  Some recent  data might be hidden    Review of Systems  Musculoskeletal:  Positive for myalgias and neck pain.       Shoulder pain Scapula pain   All other systems reviewed and are negative.     Objective:   Physical Exam  Assessment & Plan:   Kendra Simmons is a 70 year-old woman who presents today for follow-up of her lower back pain and bilateral greater trochanteric bursa pain syndrome.  1) Bilateral greater trochanteric pain syndrome: - Provided referral for PT  to focus on stretching and strengthening of the hip abductors, myofascial release, modalities, HEP  -Continue steroid injections as needed, discussed risks and benefits.   -Currently worse in the right.   -Received two greater trochanteric bursa injections on 2/26 and 3/03 with excellent results. By now thise results have worn off. She continues to take the [facility]- she usually takes only when she has to sweep and mop. She uses voltaren gel. She does not have to use this very often.   -Continue Robaxin. Has enough Tizanidine. Increased dose of Lyrica to '75mg'$  for pain and insomnia. Use proper bed.   -Discussed that it best to avoid opioids, even tramadol, for chronic pain due to the risk of tolerance and dependence.    2) Insomnia: -She has been using the Tizanidine more often prescribed to help her sleep- advised to try to use no more than three times per day.  -Increased Lyrica to '100mg'$  for pain, and this will also help with sleep.  -She plans to purchase the pregnancy  -Repeat LFTs on w/ labs with PCP.  -Discussed amitriptyline '10mg'$  HS.     3) Prediabetes:  -Patient states she was recently diagnosed with diabetes Type 2. I have personally reviewed her labs and provided dietary and exercise advice. She has lost 13 lbs since our visit 3 months ago! Discussed her current diet.  -Continue Trulicity to help curb her appetite.   4) Obesity: -Educated regarding health benefits of weight loss- for pain, general health,  chronic disease prevention, immune health, mental health.  -Will monitor weight every visit.  -Consider Roobois tea daily.  -Discussed the benefits of intermittent fasting. -Discussed foods that can assist in weight loss:  1) Eggs  2) Leafy greens  3) Salmon  4) Cruciferous vegetables  5) Lean beef and chicken breast  6) Boiled potatoes  7) Tuna  8) Beans and legumes  9) Soups  10) Cottage cheese  11) Avocados  12) Apple cider vinegar  13) Nuts  14) Whole grains  15) Chili pepper  16) Fruit- berries are some of the best  17) Grapefruit  18) Chia seeds  19) Coconut oil  20) Full-fat yogurt  -Discussed supplements that can be used:  1) Metatrim '400mg'$  BID 30 minutes before breakfast and dinner  2) Sphaeranthus indicus and Garcinia mangostana (combinations of these and #1 can be found in capsicum and zychrome  3) green coffee bean extract '400mg'$  twice per day or Irvingia (african mango) 150 to '300mg'$  twice per day.  5) Osteopenia: Vitamin D supplement, calcium supplement. Weight-bearing exercise. Continue [place]ie to exercise while watching television.   6) B12 deficiency: Continue supplement.   7) General health:  --Recommended use of Down Dog app to do 15 min of daily yoga with her daughter. Advised that this will help with both hip and low back pain.  Recommended eating pain relieving foods and provided with a handout.  Recommended Roobois tea for its multiple benefits.    --Discussed family stressors extensively. Her anxiety and depressed mood given her family is a large component of increased inflammation and pain. Advised that she focus on the positive relationships in her life and the things that she enjoys to reduce her stress and grief. Discussed her recent stressor regarding a cat her daughter brought in that is stressing her other cats.   -She has thought a lot about getting back to work. 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 Encouraged her  to  do this! I think that is a wonderful idea!  8) Cervical myofascial pain syndrome: -Continue heat -She would like to try trigger point injections in future.    10 minutes spent in discussion of her ruminations at night, listening to music, response to trigger point injections, discussion of amitriptyline, seroquel, neuropsychology.

## 2021-07-21 NOTE — Telephone Encounter (Signed)
Patient called stating she wanted to speak to you about the medication Amitriptyline. You mentioned it to her before she states.

## 2021-07-24 ENCOUNTER — Telehealth (HOSPITAL_COMMUNITY): Payer: Self-pay | Admitting: *Deleted

## 2021-07-24 NOTE — Telephone Encounter (Signed)
Pt left VM regarding GeneSight test results, insomnia, and asking Dr. Adele Schilder to call her therapist. Writer spoke with pt at length on 07/19/21 regarding all of these issues. Results explained and the insomnia discussed as well as the fact that Dr.Arfeen has spoken with her therapist. Vita Barley that pt has no memory of this conversation. Pt has been referred to [place] for possible sleep apnea. Sleep study done. Pt has an appointment for f/u and possible CPAP on 09/19/21. Pt next appointment at [facility] not until 09/21/21. Pt VM full. FYI.

## 2021-07-26 ENCOUNTER — Telehealth (HOSPITAL_COMMUNITY): Payer: Self-pay | Admitting: *Deleted

## 2021-07-26 NOTE — Telephone Encounter (Signed)
Pt called again regarding GeneSight results as well as asking Dr. Adele Schilder to call her therapist and her pain management provider as well. Pt was reminded that we have reviewed results and that Dr. Adele Schilder has spoken to her therapist. Pt now stating that Dr. Gilford Rile who does her pain management, pt has no idea the name of that pratice but did provide a number, [phone]. Pt says they have discussed adding a low dose of Elavil at hs. Please review and advise.

## 2021-07-27 NOTE — Telephone Encounter (Signed)
I called [phone] but number is not working number.  She may need [facility] or ECT treatment.  She is already on multiple medication and adding low-dose Elavil may cause worsening of anticholinergic side effects, memory impairment, sedation and dry mouth.

## 2021-07-31 NOTE — Progress Notes (Deleted)
Cardiology Office Note:    Date:  07/31/2021   ID:  Kendra Simmons, DOB 02-Aug-1951, MRN [id number]  PCP:  [name], MD  Cardiologist:  None  Electrophysiologist:  None   Referring MD: [name], MD   No chief complaint on file. ***  History of Present Illness:    Kendra Simmons is a 70 y.o. female with a hx of hyperlipidemia, T2DM who is referred by Dr. Mannie Stabile for evaluation of dyspnea and palpitations.  Past Medical History:  Diagnosis Date   Anxiety    Arthritis    Depression    Emotional depression 02/04/2015   GERD (gastroesophageal reflux disease)    High cholesterol    Insomnia    Memory loss     Past Surgical History:  Procedure Laterality Date   CESAREAN SECTION     COSMETIC SURGERY     ESOPHAGEAL MANOMETRY N/A 09/26/2016   Procedure: ESOPHAGEAL MANOMETRY (EM);  Surgeon: Clarene Essex, MD;  Location: [facility];  Service: Endoscopy;  Laterality: N/A;   laproscopy      Current Medications: No outpatient medications have been marked as taking for the 08/01/21 encounter (Appointment) with Donato Heinz, MD.     Allergies:   Phentermine and Augmentin [amoxicillin-pot clavulanate]   Social History   Socioeconomic History   Marital status: Divorced    Spouse name: Not on file   Number of children: 3   Years of education: 14   Highest education level: Not on file  Occupational History   Occupation: Retired  Tobacco Use   Smoking status: Never   Smokeless tobacco: Never  Vaping Use   Vaping Use: Never used  Substance and Sexual Activity   Alcohol use: No    Alcohol/week: 0.0 standard drinks    Comment: zero   Drug use: No    Types: Barbituates, Benzodiazepines    Comment: Denies any drug use other than benzos   Sexual activity: Never    Birth control/protection: Abstinence  Other Topics Concern   Not on file  Social History Narrative   Patient lives at home alone.   Caffeine Use:  2 cokes daily   Sister  lives next door.   Social Determinants of Health   Financial Resource Strain: Not on file  Food Insecurity: Not on file  Transportation Needs: Not on file  Physical Activity: Not on file  Stress: Not on file  Social Connections: Not on file     Family History: The patient's ***family history includes Alcohol abuse in her father; Diabetes in her cousin, maternal uncle, mother, and sister; Sleep apnea in her father.  ROS:   Please see the history of present illness.    *** All other systems reviewed and are negative.  EKGs/Labs/Other Studies Reviewed:    The following studies were reviewed today: ***  EKG:  EKG is *** ordered today.  The ekg ordered today demonstrates ***  Recent Labs: 03/24/2021: ALT 19; BUN 16; Creatinine, Ser 0.94; Hemoglobin 11.8; Platelets 132; Potassium 3.9; Sodium 143  Recent Lipid Panel    Component Value Date/Time   CHOL 177 11/04/2014 0650   TRIG 79 11/04/2014 0650   HDL 45 11/04/2014 0650   CHOLHDL 3.9 11/04/2014 0650   VLDL 16 11/04/2014 0650   LDLCALC 116 (H) 11/04/2014 0650    Physical Exam:    VS:  There were no vitals taken for this visit.    Wt Readings from Last 3 Encounters:  07/04/21 186 lb 6.4 oz (  84.6 kg)  04/05/21 187 lb (84.8 kg)  03/02/21 188 lb (85.3 kg)     GEN: *** Well nourished, well developed in no acute distress HEENT: Normal NECK: No JVD; No carotid bruits LYMPHATICS: No lymphadenopathy CARDIAC: ***RRR, no murmurs, rubs, gallops RESPIRATORY:  Clear to auscultation without rales, wheezing or rhonchi  ABDOMEN: Soft, non-tender, non-distended MUSCULOSKELETAL:  No edema; No deformity  SKIN: Warm and dry NEUROLOGIC:  Alert and oriented x 3 PSYCHIATRIC:  Normal affect   ASSESSMENT:    No diagnosis found. PLAN:    Dyspnea:  Palpitations:  Hyperlipidemia: On rosuvastatin 20 mg daily  Hypertension: On lisinopril 2.5 mg daily  123456: On Trulicity    Medication Adjustments/Labs and Tests Ordered: Current  medicines are reviewed at length with the patient today.  Concerns regarding medicines are outlined above.  No orders of the defined types were placed in this encounter.  No orders of the defined types were placed in this encounter.   There are no Patient Instructions on file for this visit.   Signed, Donato Heinz, MD  07/31/2021 2:40 PM    [facility]

## 2021-08-01 ENCOUNTER — Ambulatory Visit: Payer: 59 | Admitting: Cardiology

## 2021-08-03 ENCOUNTER — Telehealth: Payer: Self-pay

## 2021-08-03 NOTE — Telephone Encounter (Signed)
Kendra Simmons can not sleep.  She has not heard back from Dr. Berniece Andreas phone [phone].  She wanted know if you would call him about a possible medication change?    Patient call back phone number is [phone] or [phone].

## 2021-08-04 ENCOUNTER — Telehealth: Payer: Self-pay | Admitting: *Deleted

## 2021-08-04 ENCOUNTER — Other Ambulatory Visit: Payer: Self-pay

## 2021-08-04 ENCOUNTER — Encounter (HOSPITAL_BASED_OUTPATIENT_CLINIC_OR_DEPARTMENT_OTHER): Payer: 59 | Admitting: Physical Medicine and Rehabilitation

## 2021-08-04 DIAGNOSIS — M7061 Trochanteric bursitis, right hip: Secondary | ICD-10-CM | POA: Diagnosis not present

## 2021-08-04 DIAGNOSIS — F411 Generalized anxiety disorder: Secondary | ICD-10-CM | POA: Diagnosis not present

## 2021-08-04 DIAGNOSIS — G4701 Insomnia due to medical condition: Secondary | ICD-10-CM | POA: Diagnosis not present

## 2021-08-04 DIAGNOSIS — M7062 Trochanteric bursitis, left hip: Secondary | ICD-10-CM

## 2021-08-04 MED ORDER — QUETIAPINE FUMARATE 300 MG PO TABS: 300.0000 mg | ORAL_TABLET | Freq: Every day | ORAL | 3 refills | Status: DC

## 2021-08-04 MED ORDER — AMITRIPTYLINE HCL 10 MG PO TABS: 10.0000 mg | ORAL_TABLET | Freq: Every day | ORAL | 1 refills | Status: DC

## 2021-08-04 MED ORDER — QUETIAPINE FUMARATE 50 MG PO TABS: 50.0000 mg | ORAL_TABLET | Freq: Every day | ORAL | 3 refills | Status: DC

## 2021-08-04 NOTE — Telephone Encounter (Signed)
Kendra Simmons called and says that her right hip and back pain has been bad for past 3 days and now is almost unbearable. She is asking that you please give her a call. [phone] (H)  [phone] (M) .

## 2021-08-07 NOTE — Progress Notes (Signed)
Subjective:    Patient ID: Kendra Simmons, female    DOB: 1951-05-08, 70 y.o.   MRN: [id number]  HPI  An audio/video tele-health visit is felt to be the most appropriate encounter for this patient at this time.  This is a follow up tele-visit via phone. The patient is at home. MD is at office.    Kendra Simmons is a 70 year old woman who presents for follow-up of anxiety, pain, depression, and insomnia.   Since last visit, she has still been having bilateral hip pain secondary to greater trochanteric pain syndrome. Her pain is also present in her hip joint, scapula. She has some benefit from the steroid injection.   1) Bilateral greater trochanteric pain syndrome -She has been taking Robaxin -She has been using voltaren gel -She has ordered a pillow to help offload her lateral hips- she will get this 3/10 -had some benefit with steroid injections -pain continues to be quite severe and interferes with her sleep.   2) Cervical myofascial pain syndrome: -muscles feel very tight near her scapula and upper back -She has been using heating pad every day.  -She notes poor posture.  -She has had injections in her upper back before.   3) Obesity: -She got down to 180 and climbed back up again to 192.  -BMI is 32.96 -She has been considering lap band surgery. She is not sure if she would be approved for this.  -She plans to walk more in the summer.  3) Prediabetes  4) HLD  5) Depression:  -She takes Wellbutrin.  -she has been following with a psychiatrist  6) Anxiety: she has been ruminating a lot.  -this prevents her from sleeping at night  7) Insomnia:  -She has been taking Seroquel and it seems to have stopped helping.  -she would like to try the amitriptyline  Prior history:  She does not eat anything until lunch time. She eats a lot of strawberries and lunch. She feels she needs to lay off the bread. Her daughter bakes pizza and then it is hard for her to resist  this. She eats a lot of broccoli ad stir fired vegetables. She drinks lemonade. She went to the dentist and was advised not to eat tea. If she eats a big lunch she does not eat much supper.   She takes probiotics and omeprazole.   Prior history:  Kendra Simmons is a 70 year-old woman who presents today for follow-up of her lower back pain and bilateral hip pain. Her pain is usually well controlled but she often has spasms during the day and before she sleeps at night. She has been taking Tizanidine '4mg'$  HS and this has been helping her. She has Tramadol prescribed but has been taking this sparingly and does not feel much relief from it. Once when she had additional spasms during the day she took the Tizanidine and felt very sleepy afterward She has done therapy in the past and does not want to again at this time, though she knows she needs to exercise more. She used to walk in her church, but it is closed to walking now due to the pandemic.  Since last visit I performed bilateral greater trochancteric bursa injections and her pain has decreased from 6 to 4. Her Lyrica dose has been decreased by Dr. Adele Schilder, her psychiatrist, to avoid polypharmacy, and she feels this change has increased her pain and insomnia.    During today's visit she discusses extensively about  her family stressors and how they are impacting her quality of life. She has regular appointments with Dr. Adele Schilder as well that she finds very helpful.   Pain Inventory Average Pain 9 Pain Right Now 4 My pain is dull and aching  In the last 24 hours, has pain interfered with the following? General activity 9 Relation with others 9 Enjoyment of life 9 What TIME of day is your pain at its worst?  morning and night Sleep (in general) Good  Pain is worse with: bending and some activites Pain improves with: rest and medication Relief from Meds: 4   Family History  Problem Relation Age of Onset   Sleep apnea Father    Alcohol abuse  Father    Diabetes Mother    Diabetes Sister    Diabetes Maternal Uncle    Diabetes Cousin    Social History   Socioeconomic History   Marital status: Divorced    Spouse name: Not on file   Number of children: 3   Years of education: 14   Highest education level: Not on file  Occupational History   Occupation: Retired  Tobacco Use   Smoking status: Never   Smokeless tobacco: Never  Scientific laboratory technician Use: Never used  Substance and Sexual Activity   Alcohol use: No    Alcohol/week: 0.0 standard drinks    Comment: zero   Drug use: No    Types: Barbituates, Benzodiazepines    Comment: Denies any drug use other than benzos   Sexual activity: Never    Birth control/protection: Abstinence  Other Topics Concern   Not on file  Social History Narrative   Patient lives at home alone.   Caffeine Use:  2 cokes daily   Sister lives next door.   Social Determinants of Health   Financial Resource Strain: Not on file  Food Insecurity: Not on file  Transportation Needs: Not on file  Physical Activity: Not on file  Stress: Not on file  Social Connections: Not on file   Past Surgical History:  Procedure Laterality Date   CESAREAN SECTION     COSMETIC SURGERY     ESOPHAGEAL MANOMETRY N/A 09/26/2016   Procedure: ESOPHAGEAL MANOMETRY (EM);  Surgeon: Clarene Essex, MD;  Location: [facility];  Service: Endoscopy;  Laterality: N/A;   laproscopy     Past Medical History:  Diagnosis Date   Anxiety    Arthritis    Depression    Emotional depression 02/04/2015   GERD (gastroesophageal reflux disease)    High cholesterol    Insomnia    Memory loss    There were no vitals taken for this visit.  Opioid Risk Score:   Fall Risk Score:  `1  Depression screen PHQ 2/9  Depression screen [facility] 2/9 07/04/2021 11/30/2020 03/09/2020  Decreased Interest 3 3 0  Down, Depressed, Hopeless 3 3 0  PHQ - 2 Score 6 6 0  Some recent data might be hidden    Review of Systems  Musculoskeletal:   Positive for myalgias and neck pain.       Shoulder pain Scapula pain   All other systems reviewed and are negative.     Objective:   Physical Exam Not performed since patient was seen via phone.  Assessment & Plan:   Kendra Simmons is a 70 year-old woman who presents today for follow-up of her lower back pain and bilateral greater trochanteric bursa pain syndrome.  1) Bilateral greater trochanteric pain syndrome: -  Provided referral for PT to focus on stretching and strengthening of the hip abductors, myofascial release, modalities, HEP  -Continue steroid injections as needed, discussed risks and benefits, at least 3 months in between  -Currently worse in the right- discussed benefits of pregnancy pillow  -Received two greater trochanteric bursa injections on 2/26 and 3/03 with excellent results. By now thise results have worn off. She continues to take the [facility]- she usually takes only when she has to sweep and mop. She uses voltaren gel. She does not have to use this very often.   -Continue Robaxin. Has enough Tizanidine. Increased dose of Lyrica to '75mg'$  for pain and insomnia. Use proper bed.   -Discussed that it best to avoid opioids, even tramadol, for chronic pain due to the risk of tolerance and dependence.    2) Insomnia: -She has been using the Tizanidine more often prescribed to help her sleep- advised to try to use no more than three times per day.  -Increased Lyrica to '100mg'$  for pain, and this will also help with sleep.  -She plans to purchase the pregnancy  -Repeat LFTs on w/ labs with PCP.  -Discussed amitriptyline '10mg'$  HS- prescribed    3) Prediabetes:  -Patient states she was recently diagnosed with diabetes Type 2. I have personally reviewed her labs and provided dietary and exercise advice. She has lost 13 lbs since our visit 3 months ago! Discussed her current diet.  -Continue Trulicity to help curb her appetite.   4) Obesity: -Educated regarding  health benefits of weight loss- for pain, general health, chronic disease prevention, immune health, mental health.  -Will monitor weight every visit.  -Consider Roobois tea daily.  -Discussed the benefits of intermittent fasting. -Discussed foods that can assist in weight loss:  1) Eggs  2) Leafy greens  3) Salmon  4) Cruciferous vegetables  5) Lean beef and chicken breast  6) Boiled potatoes  7) Tuna  8) Beans and legumes  9) Soups  10) Cottage cheese  11) Avocados  12) Apple cider vinegar  13) Nuts  14) Whole grains  15) Chili pepper  16) Fruit- berries are some of the best  17) Grapefruit  18) Chia seeds  19) Coconut oil  20) Full-fat yogurt  -Discussed supplements that can be used:  1) Metatrim '400mg'$  BID 30 minutes before breakfast and dinner  2) Sphaeranthus indicus and Garcinia mangostana (combinations of these and #1 can be found in capsicum and zychrome  3) green coffee bean extract '400mg'$  twice per day or Irvingia (african mango) 150 to '300mg'$  twice per day.  5) Osteopenia: Vitamin D supplement, calcium supplement. Weight-bearing exercise. Continue Cubie to exercise while watching television.   6) B12 deficiency: Continue supplement.   7) General health:  --Recommended use of Down Dog app to do 15 min of daily yoga with her daughter. Advised that this will help with both hip and low back pain.  Recommended eating pain relieving foods and provided with a handout.  Recommended Roobois tea for its multiple benefits.    --Discussed family stressors extensively. Her anxiety and depressed mood given her family is a large component of increased inflammation and pain. Advised that she focus on the positive relationships in her life and the things that she enjoys to reduce her stress and grief. Discussed her recent stressor regarding a cat her daughter brought in that is stressing her other cats.   -She has thought a lot about getting back to work. 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 Encouraged her to do this! I think that is a wonderful idea!  8) Cervical myofascial pain syndrome: -Continue heat -She would like to try trigger point injections in future.   9) Anxiety: Seroquel appears to have stopped helping. Wean to '375mg'$  at night. Discussed benefits of meditation   10 minutes spent in discussion of her anxiety, insomnia, bilateral greater trochanteric bursitis, response to steroids, prescribing amitriptyline and the risks and benefits of this medication, weaning Seroquel

## 2021-08-17 ENCOUNTER — Other Ambulatory Visit: Payer: Self-pay | Admitting: Physical Medicine and Rehabilitation

## 2021-08-17 ENCOUNTER — Telehealth: Payer: Self-pay

## 2021-08-17 MED ORDER — PREGABALIN 100 MG PO CAPS: 100.0000 mg | ORAL_CAPSULE | Freq: Every day | ORAL | 2 refills | Status: DC

## 2021-08-17 NOTE — Telephone Encounter (Signed)
Refill request for Pregabalin 100 mg one at bedtime

## 2021-08-18 ENCOUNTER — Other Ambulatory Visit: Payer: Self-pay | Admitting: Physical Medicine and Rehabilitation

## 2021-08-18 MED ORDER — AMITRIPTYLINE HCL 10 MG PO TABS: 30.0000 mg | ORAL_TABLET | Freq: Every day | ORAL | 1 refills | Status: DC

## 2021-08-24 ENCOUNTER — Encounter: Payer: 59 | Attending: Physical Medicine and Rehabilitation | Admitting: Physical Medicine and Rehabilitation

## 2021-08-24 ENCOUNTER — Other Ambulatory Visit: Payer: Self-pay

## 2021-08-24 VITALS — BP 118/78 | HR 84 | Temp 97.7°F | Ht 64.0 in | Wt 188.8 lb

## 2021-08-24 DIAGNOSIS — M7062 Trochanteric bursitis, left hip: Secondary | ICD-10-CM | POA: Diagnosis present

## 2021-08-24 DIAGNOSIS — G4701 Insomnia due to medical condition: Secondary | ICD-10-CM | POA: Insufficient documentation

## 2021-08-24 DIAGNOSIS — F411 Generalized anxiety disorder: Secondary | ICD-10-CM | POA: Diagnosis present

## 2021-08-24 DIAGNOSIS — M7061 Trochanteric bursitis, right hip: Secondary | ICD-10-CM | POA: Diagnosis not present

## 2021-08-24 MED ORDER — AMITRIPTYLINE HCL 50 MG PO TABS: 50.0000 mg | ORAL_TABLET | Freq: Every day | ORAL | 3 refills | Status: DC

## 2021-08-24 NOTE — Progress Notes (Signed)
Subjective:    Patient ID: Kendra Simmons, female    DOB: 04/22/1951, 70 y.o.   MRN: [id number]  HPI  Kendra Simmons is a 70 year old woman who presents for f/u of anxiety, pain, depression, and insomnia.   Since last visit, she has still been having bilateral hip pain secondary to greater trochanteric pain syndrome. Her pain is also present in her hip joint, scapula. She has some benefit from the steroid injection.   1) Bilateral greater trochanteric pain syndrome -She has been taking Robaxin -She has been using voltaren gel -She has ordered a pillow to help offload her lateral hips- she will get this 3/10 -had some benefit with steroid injections -pain continues to be quite severe and interferes with her sleep.  -pain has been really bad -her last injections were on 7/19  2) Cervical myofascial pain syndrome: -muscles feel very tight near her scapula and upper back -She has been using heating pad every day.  -She notes poor posture.  -She has had injections in her upper back before.   3) Obesity: -She got down to 180 and climbed back up again to 192.  -BMI is 32.96 -She has been considering lap band surgery. She is not sure if she would be approved for this.  -She plans to walk more in the summer.  3) Prediabetes  4) HLD  5) Depression:  -She takes Wellbutrin.  -she has been following with a psychiatrist  6) Anxiety: she has been ruminating a lot.  -this prevents her from sleeping at night  7) Insomnia:  -She has been taking Seroquel and it seems to have stopped helping.  -she would like to try the amitriptyline -this has been a problem for her as long as she can remember.   8) Constipation: -she has been taking colace  Prior history:  She does not eat anything until lunch time. She eats a lot of strawberries and lunch. She feels she needs to lay off the bread. Her daughter bakes pizza and then it is hard for her to resist this. She eats a lot of  broccoli ad stir fired vegetables. She drinks lemonade. She went to the dentist and was advised not to eat tea. If she eats a big lunch she does not eat much supper.   She takes probiotics and omeprazole.   Prior history:  Kendra Simmons is a 70 year-old woman who presents today for follow-up of her lower back pain and bilateral hip pain. Her pain is usually well controlled but she often has spasms during the day and before she sleeps at night. She has been taking Tizanidine '4mg'$  HS and this has been helping her. She has Tramadol prescribed but has been taking this sparingly and does not feel much relief from it. Once when she had additional spasms during the day she took the Tizanidine and felt very sleepy afterward She has done therapy in the past and does not want to again at this time, though she knows she needs to exercise more. She used to walk in her church, but it is closed to walking now due to the pandemic.  Since last visit I performed bilateral greater trochancteric bursa injections and her pain has decreased from 6 to 4. Her Lyrica dose has been decreased by Dr. Adele Schilder, her psychiatrist, to avoid polypharmacy, and she feels this change has increased her pain and insomnia.    During today's visit she discusses extensively about her family stressors and how they  are impacting her quality of life. She has regular appointments with Dr. Adele Schilder as well that she finds very helpful.   Pain Inventory Average Pain 9 Pain Right Now 4 My pain is dull and aching  In the last 24 hours, has pain interfered with the following? General activity 9 Relation with others 9 Enjoyment of life 9 What TIME of day is your pain at its worst?  morning and night Sleep (in general) Good  Pain is worse with: bending and some activites Pain improves with: rest and medication Relief from Meds: 4   Family History  Problem Relation Age of Onset   Sleep apnea Father    Alcohol abuse Father    Diabetes Mother     Diabetes Sister    Diabetes Maternal Uncle    Diabetes Cousin    Social History   Socioeconomic History   Marital status: Divorced    Spouse name: Not on file   Number of children: 3   Years of education: 14   Highest education level: Not on file  Occupational History   Occupation: Retired  Tobacco Use   Smoking status: Never   Smokeless tobacco: Never  Scientific laboratory technician Use: Never used  Substance and Sexual Activity   Alcohol use: No    Alcohol/week: 0.0 standard drinks    Comment: zero   Drug use: No    Types: Barbituates, Benzodiazepines    Comment: Denies any drug use other than benzos   Sexual activity: Never    Birth control/protection: Abstinence  Other Topics Concern   Not on file  Social History Narrative   Patient lives at home alone.   Caffeine Use:  2 cokes daily   Sister lives next door.   Social Determinants of Health   Financial Resource Strain: Not on file  Food Insecurity: Not on file  Transportation Needs: Not on file  Physical Activity: Not on file  Stress: Not on file  Social Connections: Not on file   Past Surgical History:  Procedure Laterality Date   CESAREAN SECTION     COSMETIC SURGERY     ESOPHAGEAL MANOMETRY N/A 09/26/2016   Procedure: ESOPHAGEAL MANOMETRY (EM);  Surgeon: Clarene Essex, MD;  Location: [facility];  Service: Endoscopy;  Laterality: N/A;   laproscopy     Past Medical History:  Diagnosis Date   Anxiety    Arthritis    Depression    Emotional depression 02/04/2015   GERD (gastroesophageal reflux disease)    High cholesterol    Insomnia    Memory loss    BP 118/78   Pulse 84   Temp 97.7 F (36.5 C) (Oral)   Ht '5\' 4"'$  (1.626 m)   Wt 188 lb 12.8 oz (85.6 kg)   SpO2 98%   BMI 32.41 kg/m   Opioid Risk Score:   Fall Risk Score:  `1  Depression screen PHQ 2/9  Depression screen [facility] 2/9 08/24/2021 07/04/2021 11/30/2020 03/09/2020  Decreased Interest 0 3 3 0  Down, Depressed, Hopeless 0 3 3 0  PHQ - 2 Score  0 6 6 0  Some recent data might be hidden    Review of Systems  Musculoskeletal:  Positive for myalgias and neck pain.       Shoulder pain Scapula pain   All other systems reviewed and are negative.     Objective:   Physical Exam Gen: no distress, normal appearing HEENT: oral mucosa pink and moist, NCAT Cardio: Reg rate Chest:  normal effort, normal rate of breathing Abd: soft, non-distended Ext: no edema Psych: pleasant, normal affect Skin: intact Neuro: Musculoskeletal:  Assessment & Plan:   Kendra Simmons is a 70 year-old woman who presents today for follow-up of her lower back pain and bilateral greater trochanteric bursa pain syndrome.  1) Bilateral greater trochanteric pain syndrome: - Provided referral for PT to focus on stretching and strengthening of the hip abductors, myofascial release, modalities, HEP  -Continue steroid injections as needed, discussed risks and benefits, at least 3 months in between  -Currently worse in the right- discussed benefits of pregnancy pillow  -Received two greater trochanteric bursa injections on 2/26 and 3/03 with excellent results. By now thise results have worn off. She continues to take the [facility]- she usually takes only when she has to sweep and mop. She uses voltaren gel. She does not have to use this very often.   -Continue Robaxin. Has enough Tizanidine. Increased dose of Lyrica to '75mg'$  for pain and insomnia. Use proper bed.   -Discussed that it best to avoid opioids, even tramadol, for chronic pain due to the risk of tolerance and dependence.    2) Insomnia: -She has been using the Tizanidine more often prescribed to help her sleep- advised to try to use no more than three times per day.  -Increased Lyrica to '100mg'$  for pain, and this will also help with sleep.  -She plans to purchase the pregnancy  -Repeat LFTs on w/ labs with PCP.  -Increase amitriptyline '50mg'$ .  -Try to go outside near sunrise -Get exercise during  the day.  -Discussed good sleep hygiene: turning off all devices an hour before bedtime.  -Chamomile tea with dinner.  -Can consider over the counter melatonin     3) Prediabetes:  -Patient states she was recently diagnosed with diabetes Type 2. I have personally reviewed her labs and provided dietary and exercise advice. She has lost 13 lbs since our visit 3 months ago! Discussed her current diet.  -Continue Trulicity to help curb her appetite.   4) Obesity: -Educated regarding health benefits of weight loss- for pain, general health, chronic disease prevention, immune health, mental health.  -188 lbs -Will monitor weight every visit.  -Consider Roobois tea daily.  -Discussed foods that can assist in weight loss: 1) leafy greens- high in fiber and nutrients 2) dark chocolate- improves metabolism (if prefer sweetened, best to sweeten with honey instead of sugar).  3) cruciferous vegetables- high in fiber and protein 4) full fat yogurt: high in healthy fat, protein, calcium, and probiotics 5) apples- high in a variety of phytochemicals 6) nuts- high in fiber and protein that increase feelings of fullness 7) grapefruit: rich in nutrients, antioxidants, and fiber (not to be taken with anticoagulation) 8) beans- high in protein and fiber 9) salmon- has high quality protein and healthy fats 10) green tea- rich in polyphenols 11) eggs- rich in choline and vitamin D 12) tuna- high protein, boosts metabolism 13) avocado- decreases visceral abdominal fat 14) chicken (pasture raised): high in protein and iron 15) blueberries- reduce abdominal fat and cholesterol 16) whole grains- decreases calories retained during digestion, speeds metabolism 17) chia seeds- curb appetite 18) chilies- increases fat metabolism  -Discussed supplements that can be used:  1) Metatrim '400mg'$  BID 30 minutes before breakfast and dinner  2) Sphaeranthus indicus and Garcinia mangostana (combinations of these and #1  can be found in capsicum and zychrome  3) green coffee bean extract '400mg'$  twice per day or Irvingia (  african mango) 150 to '300mg'$  twice per day.   5) Osteopenia: Vitamin D supplement, calcium supplement. Weight-bearing exercise. Continue Cubie to exercise while watching television.   6) B12 deficiency: Continue supplement.   7) General health:  --Recommended use of Down Dog app to do 15 min of daily yoga with her daughter. Advised that this will help with both hip and low back pain.  Recommended eating pain relieving foods and provided with a handout.  Recommended Roobois tea for its multiple benefits.    --Discussed family stressors extensively. Her anxiety and depressed mood given her family is a large component of increased inflammation and pain. Advised that she focus on the positive relationships in her life and the things that she enjoys to reduce her stress and grief. Discussed her recent stressor regarding a cat her daughter brought in that is stressing her other cats.   -She has thought a lot about getting back to work. 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 Encouraged her to do this! I think that is a wonderful idea!  8) Cervical myofascial pain syndrome: -Continue heat -She would like to try trigger point injections in future.   9) Anxiety: Seroquel appears to have stopped helping. Wean to '300mg'$  Discussed benefits of meditation Increase amitriptyline to '50mg'$ .  -Discussed exercise and meditation as tools to decrease anxiety. -Recommended Down Dog Yoga app -Discussed spending time outdoors. -Discussed positive re-framing of anxiety.  -Discussed the following foods that have been show to reduce anxiety: 1) [place] nuts, mushrooms, soy beans due to their high selenium content. Upper limit of toxicity of selenium is 471mg/day so no more than 3-4 bBolivianuts per day.  2) Fatty fish such as salmon, mackerel, sardines, trout, and herring- high in omega-3  fatty acids 3) Eggs- increases serotonin and dopamine 4) Pumpkin seeds- high in omega-3 fatty acids 5) dark chocolate- high in flavanols that increase blood flow to brain 6) turmeric- take with black pepper to increase absorption 7) chamomile tea- antioxidant and anti-inflammatory properties 8) yogurt without sugar- supports gut-brain axis 9) green tea- contains L- theanine 10) blueberries- high in vitamin C and antioxidants 11) t[place] high in tryptophan which gets converted to serotonin 12) bell peppers- rich in vitamin C and antioxidants 13) citrus fruits- rich in vitamin C and antioxidants 14) almonds- high in vitamin E and healthy fats 15) chia seeds- high in omega-3 fatty acids   10) Constipation:  -Provided list of following foods that help with constipation and highlighted a few: 1) prunes- contain high amounts of fiber.  2) apples- has a form of dietary fiber called pectin that accelerates stool movement and increases beneficial gut bacteria 3) pears- in addition to fiber, also high in fructose and sorbitol which have laxative effect 4) figs- contain an enzyme ficin which helps to speed colonic transit 5) kiwis- contain an enzyme actinidin that improves gut motility and reduces constipation 6) oranges- rich in pectin (like apples) 7) grapefruits- contain a flavanol naringenin which has a laxative effect 8) vegetables- rich in fiber and also great sources of folate, vitamin C, and K 9) artichoke- high in inulin, prebiotic great for the microbiome 10) chicory- increases stool frequency and softness (can be added to coffee) 11) rhubarb- laxative effect 12) sweet potato- high fiber 13) beans, peas, and lentils- contain both soluble and insoluble fiber 14) chia seeds- improves intestinal health and gut flora 15) flaxseeds- laxative effect 16) whole grain rye bread- high in fiber 17)  oat bran- high in soluble and insoluble fiber 18) kefir- softens stools -recommended to try at  least one of these foods every day.  -drink 6-8 glasses of water per day -walk regularly, especially after meals.

## 2021-08-24 NOTE — Patient Instructions (Addendum)
Insomnia: -Try to go outside near sunrise -Get exercise during the day.  -Discussed good sleep hygiene: turning off all devices an hour before bedtime.  -Chamomile tea with dinner.  -Can consider over the counter melatonin -tart cherry juice  Constipation:  -Provided list of following foods that help with constipation and highlighted a few: 1) prunes- contain high amounts of fiber.  2) apples- has a form of dietary fiber called pectin that accelerates stool movement and increases beneficial gut bacteria 3) pears- in addition to fiber, also high in fructose and sorbitol which have laxative effect 4) figs- contain an enzyme ficin which helps to speed colonic transit 5) kiwis- contain an enzyme actinidin that improves gut motility and reduces constipation 6) oranges- rich in pectin (like apples) 7) grapefruits- contain a flavanol naringenin which has a laxative effect 8) vegetables- rich in fiber and also great sources of folate, vitamin C, and K 9) artichoke- high in inulin, prebiotic great for the microbiome 10) chicory- increases stool frequency and softness (can be added to coffee) 11) rhubarb- laxative effect 12) sweet potato- high fiber 13) beans, peas, and lentils- contain both soluble and insoluble fiber 14) chia seeds- improves intestinal health and gut flora 15) flaxseeds- laxative effect 16) whole grain rye bread- high in fiber 17) oat bran- high in soluble and insoluble fiber 18) kefir- softens stools -recommended to try at least one of these foods every day.  -drink 6-8 glasses of water per day -walk regularly, especially after meals.     Anxiety: 1) [place] nuts, mushrooms, soy beans due to their high selenium content. Upper limit of toxicity of selenium is 422mg/day so no more than 3-4 bBolivianuts per day.  2) Fatty fish such as salmon, mackerel, sardines, trout, and herring- high in omega-3 fatty acids 3) Eggs- increases serotonin and dopamine 4) Pumpkin seeds- high  in omega-3 fatty acids 5) dark chocolate- high in flavanols that increase blood flow to brain 6) turmeric- take with black pepper to increase absorption 7) chamomile tea- antioxidant and anti-inflammatory properties 8) yogurt without sugar- supports gut-brain axis 9) green tea- contains L- theanine 10) blueberries- high in vitamin C and antioxidants 11) [place] high in tryptophan which gets converted to serotonin 12) bell peppers- rich in vitamin C and antioxidants 13) citrus fruits- rich in vitamin C and antioxidants 14) almonds- high in vitamin E and healthy fats 15) chia seeds- high in omega-3 fatty acids

## 2021-08-26 ENCOUNTER — Other Ambulatory Visit: Payer: Self-pay | Admitting: Physical Medicine and Rehabilitation

## 2021-08-28 MED ORDER — QUETIAPINE FUMARATE 300 MG PO TABS: 300.0000 mg | ORAL_TABLET | Freq: Every day | ORAL | 0 refills | Status: DC

## 2021-09-06 ENCOUNTER — Telehealth: Payer: Self-pay

## 2021-09-06 ENCOUNTER — Ambulatory Visit: Payer: Self-pay | Admitting: Neurology

## 2021-09-06 NOTE — Telephone Encounter (Signed)
Patient called requesting an increase in Amitriptyline 50 mg

## 2021-09-08 NOTE — Telephone Encounter (Signed)
Patient states she currently taking Amitriptyline 50 mg. Patient can increase to 75 mg at bedtime per Dr. Ranell Patrick. Advised patient to take 1 1/2 tablets at bedtime.

## 2021-09-15 ENCOUNTER — Other Ambulatory Visit: Payer: Self-pay | Admitting: Physical Medicine and Rehabilitation

## 2021-09-15 NOTE — Telephone Encounter (Signed)
Is this the correct dosage for patient? Amitriptyline 150 mg at bedtime?

## 2021-09-15 NOTE — Telephone Encounter (Signed)
Patient states she currently taking Amitriptyline 50 mg. Patient can increase to 75 mg at bedtime per Dr. Ranell Patrick. Advised patient to take 1 1/2 tablets at bedtime. Patient wanted to increase her Amitriptyline and this is what you told me to tell the patient.

## 2021-09-18 ENCOUNTER — Telehealth: Payer: Self-pay

## 2021-09-18 ENCOUNTER — Other Ambulatory Visit: Payer: Self-pay | Admitting: Orthopaedic Surgery

## 2021-09-18 NOTE — Telephone Encounter (Signed)
Called patient back at two different phone numbers and the mailbox is full. Amitriptyline 50 mg take 1.5 tablets(75 mg total) at bedtime. Amitriptyline come in strengths of 10 mg, 25 mg, 50 mg

## 2021-09-18 NOTE — Telephone Encounter (Signed)
ok 

## 2021-09-18 NOTE — Telephone Encounter (Signed)
Cedricka Benedicto called back:   Per patient Amitriptyline 50 mg was sent to the pharmacy. She stated her dosage is 75 MG daily. Please send in additional.   Please advise or prescribe.  Thank you.

## 2021-09-19 ENCOUNTER — Other Ambulatory Visit (HOSPITAL_COMMUNITY): Payer: Self-pay | Admitting: Psychiatry

## 2021-09-19 ENCOUNTER — Ambulatory Visit: Payer: Self-pay | Admitting: Neurology

## 2021-09-19 DIAGNOSIS — F411 Generalized anxiety disorder: Secondary | ICD-10-CM

## 2021-09-19 DIAGNOSIS — F330 Major depressive disorder, recurrent, mild: Secondary | ICD-10-CM

## 2021-09-19 MED ORDER — AMITRIPTYLINE HCL 75 MG PO TABS: 75.0000 mg | ORAL_TABLET | Freq: Every day | ORAL | 0 refills | Status: DC

## 2021-09-19 NOTE — Telephone Encounter (Signed)
Pt did not like the taste of the cut amitriptyline half tablet.  She requested first that we add the 25 tablet to the 50 but amitriptyline comes in 75 mg tablet, so rx switched to 75 mg tablet 1 po q hs.

## 2021-09-21 ENCOUNTER — Other Ambulatory Visit: Payer: Self-pay

## 2021-09-21 ENCOUNTER — Other Ambulatory Visit (HOSPITAL_COMMUNITY): Payer: Self-pay | Admitting: Psychiatry

## 2021-09-21 ENCOUNTER — Telehealth (HOSPITAL_BASED_OUTPATIENT_CLINIC_OR_DEPARTMENT_OTHER): Payer: 59 | Admitting: Psychiatry

## 2021-09-21 ENCOUNTER — Encounter (HOSPITAL_COMMUNITY): Payer: Self-pay | Admitting: Psychiatry

## 2021-09-21 ENCOUNTER — Telehealth: Payer: Self-pay

## 2021-09-21 VITALS — Wt 187.0 lb

## 2021-09-21 DIAGNOSIS — F5101 Primary insomnia: Secondary | ICD-10-CM

## 2021-09-21 DIAGNOSIS — F411 Generalized anxiety disorder: Secondary | ICD-10-CM

## 2021-09-21 DIAGNOSIS — F330 Major depressive disorder, recurrent, mild: Secondary | ICD-10-CM

## 2021-09-21 MED ORDER — DOXEPIN HCL 25 MG PO CAPS: 25.0000 mg | ORAL_CAPSULE | Freq: Every day | ORAL | 0 refills | Status: DC

## 2021-09-21 MED ORDER — CLONAZEPAM 0.5 MG PO TABS: 0.5000 mg | ORAL_TABLET | Freq: Every day | ORAL | 0 refills | Status: DC | PRN

## 2021-09-21 MED ORDER — BUPROPION HCL ER (XL) 150 MG PO TB24: ORAL_TABLET | ORAL | 0 refills | Status: DC

## 2021-09-21 NOTE — Progress Notes (Signed)
Virtual Visit via Telephone Note  I connected with Kendra Simmons on 09/21/21 at  2:20 PM EDT by telephone and verified that I am speaking with the correct person using two identifiers.  Location: Patient: Home Provider: Home Office   I discussed the limitations, risks, security and privacy concerns of performing an evaluation and management service by telephone and the availability of in person appointments. I also discussed with the patient that there may be a patient responsible charge related to this service. The patient expressed understanding and agreed to proceed.   History of Present Illness: Patient is evaluated by phone session.  She is now taking amitriptyline prescribed by her PCP.  Initially she started with a low-dose but now taking 75 mg at bedtime.  She admitted it is helping her sleep, anxiety but complaining of dry mouth and constipation.  Her physician cut down the Seroquel and she is taking 300 mg.  She is a still taking Lyrica, Klonopin, doxepin, Wellbutrin along with other medication.  She feels her depression is a stable but she still feels sometimes anxious and nervousness.  She noted sometimes jitteriness and tremors but it does not interfere in her daily activities.  [facility]  She had a [name] psych testing and results discussed with her.  She admitted increased appetite but her weight is stable.  She denies any crying spells or any feeling of hopelessness or worthlessness.  She denies any paranoia or any hallucination.  Lately she is doing some renovation at her place and she is hoping to have it done on time.  Past Psychiatric History:  H/O multiple inpatient and IOP. Last inpatient June 2016. H/O hallucination, paranoia and depression. Saw [name], [name] and Dr. Thurmond Butts.  Tried Xanax, amitriptyline, imipramine, temazepam, trazodone, Zyprexa, Luvox, Wellbutrin, Prozac, Vistaril, Remeron and Bellsomra.     Psychiatric Specialty  Exam: Physical Exam  Review of Systems  Constitutional:        Dry mouth  Gastrointestinal:  Positive for constipation.   Weight 187 lb (84.8 kg).There is no height or weight on file to calculate BMI.  General Appearance: NA  Eye Contact:  NA  Speech:  Slow  Volume:  Decreased  Mood:  Anxious  Affect:  NA  Thought Process:  Descriptions of Associations: Intact  Orientation:  Full (Time, Place, and Person)  Thought Content:  Rumination  Suicidal Thoughts:  No  Homicidal Thoughts:  No  Memory:  Immediate;   Good Recent;   Good Remote;   Fair  Judgement:  Fair  Insight:  Shallow  Psychomotor Activity:  NA  Concentration:  Concentration: Fair and Attention Span: Fair  Recall:  [employer] of Knowledge:  Good  Language:  Good  Akathisia:  No  Handed:  Right  AIMS (if indicated):     Assets:  Communication Skills Desire for Improvement Housing Social Support  ADL's:  Intact  Cognition:  WNL  Sleep:   better      Assessment and Plan: Major depressive disorder, recurrent.  Generalized anxiety disorder.  Primary insomnia.  I review notes from other provider and current medication.  Patient is now taking amitriptyline 75 mg along with doxepin 75 mg, Wellbutrin XL 150 mg in the morning, Klonopin 0.5 mg at bedtime.  She is also taking Seroquel 300 mg and Lyrica which was recently prescribed by her other physician.  We talked about polypharmacy.  I explained if she has some gentleness then she should  consider lowering the doxepin as doxepin and amitriptyline are on the same class.  After some discussion she agreed and we will lower the doxepin to 25 mg only at bedtime.  I recommend if she feels her anxiety and depression are getting worse then she may consider going up on amitriptyline which is recently prescribed by her other physician.  For now continue Seroquel 300 mg at bedtime it is also prescribed by her other physician.  We will continue Klonopin 0.5 mg to take as needed and  Wellbutrin XL 150 mg in the morning.  Recommended to call us back if she has any question or any concern.  Follow-up in 3 months.  Follow Up Instructions:    I discussed the assessment and treatment plan with the patient. The patient was provided an opportunity to ask questions and all were answered. The patient agreed with the plan and demonstrated an understanding of the instructions.   The patient was advised to call back or seek an in-person evaluation if the symptoms worsen or if the condition fails to improve as anticipated.  I provided 23 minutes of non-face-to-face time during this encounter.   [name], MD

## 2021-09-21 NOTE — Telephone Encounter (Signed)
Patient called stating she talk to her psychiatrist and he is fine with her being on Amitriptyline but is going to wean her off Doxepin and have her stay at the same level with Seroquel.

## 2021-09-25 ENCOUNTER — Other Ambulatory Visit: Payer: Self-pay | Admitting: Physical Medicine and Rehabilitation

## 2021-09-25 ENCOUNTER — Other Ambulatory Visit (HOSPITAL_COMMUNITY): Payer: Self-pay | Admitting: Psychiatry

## 2021-09-25 DIAGNOSIS — F330 Major depressive disorder, recurrent, mild: Secondary | ICD-10-CM

## 2021-10-02 ENCOUNTER — Ambulatory Visit: Payer: 59 | Admitting: Cardiology

## 2021-10-11 ENCOUNTER — Other Ambulatory Visit: Payer: Self-pay | Admitting: Gastroenterology

## 2021-10-12 ENCOUNTER — Encounter (HOSPITAL_COMMUNITY): Payer: Self-pay | Admitting: Gastroenterology

## 2021-10-12 NOTE — Progress Notes (Signed)
Attempted to obtain medical history via telephone, unable to reach at this time. Unable to leave voicemail to return pre surgical testing department's phone call,due to mailbox full.  

## 2021-10-13 ENCOUNTER — Ambulatory Visit (INDEPENDENT_AMBULATORY_CARE_PROVIDER_SITE_OTHER): Payer: 59

## 2021-10-13 ENCOUNTER — Encounter (HOSPITAL_COMMUNITY): Payer: Self-pay

## 2021-10-13 ENCOUNTER — Ambulatory Visit (HOSPITAL_COMMUNITY)
Admission: EM | Admit: 2021-10-13 | Discharge: 2021-10-13 | Disposition: A | Discharge status: Home / Self Care | Payer: 59 | Attending: Emergency Medicine | Admitting: Emergency Medicine

## 2021-10-13 ENCOUNTER — Other Ambulatory Visit: Payer: Self-pay

## 2021-10-13 DIAGNOSIS — J029 Acute pharyngitis, unspecified: Secondary | ICD-10-CM

## 2021-10-13 DIAGNOSIS — R051 Acute cough: Secondary | ICD-10-CM | POA: Insufficient documentation

## 2021-10-13 DIAGNOSIS — K2100 Gastro-esophageal reflux disease with esophagitis, without bleeding: Secondary | ICD-10-CM | POA: Diagnosis present

## 2021-10-13 DIAGNOSIS — Z20822 Contact with and (suspected) exposure to covid-19: Secondary | ICD-10-CM | POA: Diagnosis present

## 2021-10-13 DIAGNOSIS — R059 Cough, unspecified: Secondary | ICD-10-CM

## 2021-10-13 LAB — POC INFLUENZA A AND B ANTIGEN (URGENT CARE ONLY)
INFLUENZA A ANTIGEN, POC: NEGATIVE
INFLUENZA B ANTIGEN, POC: NEGATIVE

## 2021-10-13 MED ORDER — BENZONATATE 200 MG PO CAPS
200.0000 mg | ORAL_CAPSULE | Freq: Three times a day (TID) | ORAL | 0 refills | Status: DC | PRN
Start: 2021-10-13 — End: 2021-12-25

## 2021-10-13 NOTE — Discharge Instructions (Addendum)
Your x-ray was negative for a pneumonia or inflammation.  Your rapid flu is negative.  Your COVID will be back in 6 to 24 hours.  If it is positive, I will prescribe Molnupiravir. You can try 1000 mg Tylenol combined with 400 mg of ibuprofen together 3-4 times a day as needed for body aches, headaches.  Try some Tessalon for the cough, although I suspect that your cough is primarily coming from your reflux. continue your usual GERD medications.  Go immediately to the ED for fevers above 100.4, if you get worse, if you have difficulty breathing, or for any other concerns.

## 2021-10-13 NOTE — ED Provider Notes (Signed)
HPI  SUBJECTIVE:  Kendra Simmons is a 70 y.o. female who presents with a "junky" cough, laryngitis, malaise, headache, some body aches, generalized weakness starting last night.  She reports feeling "winded" with exertion this morning.  She has been having a lot of issues with reflux recently.  She has a sore throat secondary to the reflux, burning chest pain, and reports pain with swallowing.  She has doubled up on the omeprazole without much improvement in her symptoms.  She has ongoing nasal congestion, rhinorrhea, postnasal drip.  No fevers, sinus pain or pressure, loss of sense of smell or taste, wheezing, shortness of breath, vomiting, diarrhea, abdominal pain.  No known COVID or flu exposure.  She got the second dose of the COVID-vaccine and the flu vaccine.  She has not tried anything for her cough.  She is taking omeprazole, Allegra, Carafate and Zofran for her other symptoms without improvement.  Symptoms worse with lying down.  She has a procedure scheduled on Tuesday and wants to make sure that she is not infectious so that she can have this done.  States that she contacted her PMD who is requesting a chest x-ray.  She has a past medical history including hypercholesterolemia, hypertension, right upper lobe community-acquired pneumonia which was thought to be a pneumonitis/aspiration pneumonia in December 2019, COVID, GERD, Parkinson's, and tachycardia.  [id number], Apolonio Schneiders, MD  Past Medical History:  Diagnosis Date   Anxiety    Arthritis    Depression    Emotional depression 02/04/2015   GERD (gastroesophageal reflux disease)    High cholesterol    Insomnia    Memory loss     Past Surgical History:  Procedure Laterality Date   CESAREAN SECTION     COSMETIC SURGERY     ESOPHAGEAL MANOMETRY N/A 09/26/2016   Procedure: ESOPHAGEAL MANOMETRY (EM);  Surgeon: Clarene Essex, MD;  Location: [facility];  Service: Endoscopy;  Laterality: N/A;   laproscopy      Family History   Problem Relation Age of Onset   Sleep apnea Father    Alcohol abuse Father    Diabetes Mother    Diabetes Sister    Diabetes Maternal Uncle    Diabetes Cousin     Social History   Tobacco Use   Smoking status: Never   Smokeless tobacco: Never  Vaping Use   Vaping Use: Never used  Substance Use Topics   Alcohol use: No    Alcohol/week: 0.0 standard drinks    Comment: zero   Drug use: No    Types: Barbituates, Benzodiazepines    Comment: Denies any drug use other than benzos    No current facility-administered medications for this encounter.  Current Outpatient Medications:    benzonatate (TESSALON) 200 MG capsule, Take 1 capsule (200 mg total) by mouth 3 (three) times daily as needed for cough., Disp: 30 capsule, Rfl: 0   acetaminophen (TYLENOL) 650 MG CR tablet, Take 650 mg by mouth every 8 (eight) hours as needed (Arthritis)., Disp: , Rfl:    amitriptyline (ELAVIL) 75 MG tablet, Take 1 tablet (75 mg total) by mouth at bedtime., Disp: 90 tablet, Rfl: 0   buPROPion (WELLBUTRIN XL) 150 MG 24 hr tablet, TAKE 1 TABLET BY MOUTH EVERY DAY IN THE MORNING, Disp: 90 tablet, Rfl: 0   Calcium Citrate-Vitamin D (CALCIUM CITRATE + D3 PO), Take 1 tablet by mouth daily., Disp: , Rfl:    clonazePAM (KLONOPIN) 0.5 MG tablet, Take 1 tablet (0.5 mg total) by mouth  daily as needed for anxiety. (Patient taking differently: Take 0.5 mg by mouth at bedtime.), Disp: 90 tablet, Rfl: 0   diclofenac Sodium (VOLTAREN) 1 % GEL, APPLY 2 GRAMS TO AFFECTED AREA 4 TIMES A DAY (Patient taking differently: Apply 2 g topically daily as needed (Back and hip pain).), Disp: 300 g, Rfl: 3   diphenhydramine-acetaminophen (TYLENOL PM) 25-500 MG TABS tablet, Take 2 tablets by mouth at bedtime., Disp: , Rfl:    doxepin (SINEQUAN) 25 MG capsule, Take 1 capsule (25 mg total) by mouth at bedtime., Disp: 90 capsule, Rfl: 0   Dulaglutide (TRULICITY) 1.94 RD/4.0CX SOPN, Inject 0.75 mg into the skin once a week., Disp: , Rfl:     hyoscyamine (ANASPAZ) 0.125 MG TBDP disintergrating tablet, Place 0.125 mg under the tongue daily as needed (Choking eposide)., Disp: , Rfl:    ibuprofen (ADVIL,MOTRIN) 200 MG tablet, Take 400 mg by mouth every 4 (four) hours as needed for moderate pain. , Disp: , Rfl:    lisinopril (ZESTRIL) 2.5 MG tablet, Take 2.5 mg by mouth daily., Disp: , Rfl:    memantine (NAMENDA) 10 MG tablet, Take 1 tablet (10 mg total) by mouth 2 (two) times daily., Disp: 60 tablet, Rfl: 12   methocarbamol (ROBAXIN) 500 MG tablet, Take 1 tablet (500 mg total) by mouth every 6 (six) hours as needed for muscle spasms., Disp: 30 tablet, Rfl: 3   Multiple Vitamins-Minerals (MULTIVITAMIN PO), Take 1 tablet by mouth daily. Centrum 50 +, Disp: , Rfl:    Omega-3 Fatty Acids (OMEGA-3 FISH OIL PO), Take 1,000 mg by mouth daily. MIni, Disp: , Rfl:    omeprazole (PRILOSEC) 40 MG capsule, Take 40 mg by mouth daily. , Disp: , Rfl: 3   ondansetron (ZOFRAN ODT) 4 MG disintegrating tablet, Take 1 tablet (4 mg total) by mouth every 8 (eight) hours as needed for nausea or vomiting., Disp: 20 tablet, Rfl: 0   polycarbophil (FIBERCON) 625 MG tablet, Take 625 mg by mouth daily. gummy, Disp: , Rfl:    polyethylene glycol (MIRALAX / GLYCOLAX) 17 g packet, Take 17 g by mouth every other day., Disp: , Rfl:    pregabalin (LYRICA) 100 MG capsule, Take 1 capsule (100 mg total) by mouth at bedtime., Disp: 30 capsule, Rfl: 2   Probiotic Product (PROBIOTIC PO), Take 1 tablet by mouth daily. gummy, Disp: , Rfl:    QUEtiapine (SEROQUEL) 300 MG tablet, Take 1 tablet (300 mg total) by mouth at bedtime. (Patient taking differently: Take 400 mg by mouth at bedtime.), Disp: 90 tablet, Rfl: 0   ramelteon (ROZEREM) 8 MG tablet, Take 1 tablet (8 mg total) by mouth at bedtime. (Patient not taking: No sig reported), Disp: 30 tablet, Rfl: 2   rosuvastatin (CRESTOR) 20 MG tablet, Take 20 mg by mouth at bedtime., Disp: , Rfl:    sucralfate (CARAFATE) 1 GM/10ML  suspension, Take 10 mLs (1 g total) by mouth 4 (four) times daily -  with meals and at bedtime. (Patient taking differently: Take 1 g by mouth daily as needed (stomach).), Disp: 420 mL, Rfl: 0   tamsulosin (FLOMAX) 0.4 MG CAPS capsule, Take 1 capsule (0.4 mg total) by mouth daily after supper. For frequent urination/urgency, Disp: 15 capsule, Rfl: 0   tiZANidine (ZANAFLEX) 4 MG tablet, TAKE 1 TABLET BY MOUTH THREE TIMES A DAY (Patient taking differently: Take 12 mg by mouth at bedtime.), Disp: 270 tablet, Rfl: 0   TRULICITY 3 KG/8.1EH SOPN, Inject 0.5 mg into the skin  once a week., Disp: , Rfl:   Allergies  Allergen Reactions   Phentermine Anxiety   Augmentin [Amoxicillin-Pot Clavulanate] Diarrhea     ROS  As noted in HPI.   Physical Exam  BP (!) 122/106 (BP Location: Right Arm)   Pulse 97   Temp 98 F (36.7 C) (Oral)   Resp 18   SpO2 97%   Constitutional: Well developed, well nourished, no acute distress Eyes:  EOMI, conjunctiva normal bilaterally HENT: Normocephalic, atraumatic,mucus membranes moist Respiratory: Normal inspiratory effort, wheezing right upper lobe.  Good air movement Cardiovascular: Normal rate, regular rhythm, no murmurs rubs or gallop GI: nondistended skin: No rash, skin intact Musculoskeletal: no deformities Neurologic: Alert & oriented x 3, no focal neuro deficits Psychiatric: Speech and behavior appropriate   ED Course   Medications - No data to display  Orders Placed This Encounter  Procedures   SARS CORONAVIRUS 2 (TAT 6-24 HRS) Nasopharyngeal Nasopharyngeal Swab    Standing Status:   Standing    Number of Occurrences:   1   DG Chest 2 View    Standing Status:   Standing    Number of Occurrences:   1    Order Specific Question:   Reason for Exam (SYMPTOM  OR DIAGNOSIS REQUIRED)    Answer:   wheezing RUL r/o PNA   POC Influenza A & B Ag (Urgent Care)    Standing Status:   Standing    Number of Occurrences:   1    Results for orders  placed or performed during the hospital encounter of 10/13/21 (from the past 24 hour(s))  SARS CORONAVIRUS 2 (TAT 6-24 HRS) Nasopharyngeal Nasopharyngeal Swab     Status: None   Collection Time: 10/13/21  2:58 PM   Specimen: Nasopharyngeal Swab  Result Value Ref Range   SARS Coronavirus 2 NEGATIVE NEGATIVE  POC Influenza A & B Ag (Urgent Care)     Status: None   Collection Time: 10/13/21  3:22 PM  Result Value Ref Range   INFLUENZA A ANTIGEN, POC NEGATIVE NEGATIVE   INFLUENZA B ANTIGEN, POC NEGATIVE NEGATIVE   DG Chest 2 View  Result Date: 10/13/2021 CLINICAL DATA:  Cough and sore throat for 4 days. EXAM: CHEST - 2 VIEW COMPARISON:  03/29/2021 FINDINGS: The cardiac silhouette, mediastinal and hilar contours are normal. The lungs are clear. No pleural effusions. No pulmonary lesions. The bony thorax is intact. IMPRESSION: No acute cardiopulmonary findings. Electronically Signed   By: Marijo Sanes M.D.   On: 10/13/2021 15:24    ED Clinical Impression  1. Acute cough   2. Gastroesophageal reflux disease with esophagitis without hemorrhage   3. Encounter for laboratory testing for COVID-19 virus      ED Assessment/Plan  Checking chest x-ray due to wheezing right upper lobe rule out aspiration pneumonia, COVID and flu because of the body aches, malaise.  She will be a candidate for Molnupiravir based on age, multiple comorbidities if COVID is positive.  Hospital records reviewed.  She had a right upper lobe pneumonia 2019 which was thought to be aspiration/pneumonitis.  Sepsis was ruled out.  She was sent home with Omnicef and Flagyl.  Reviewed imaging independently.  No infiltrate.  No acute cardiopulmonary disease.  See radiology report for full details.  Chest x-ray is negative for a pneumonia/pneumonitis.  She may have aspirated, but there is no radiographic evidence of inflammation or infection at this time.  Her rapid flu is negative.  COVID is pending.  Tylenol/ibuprofen  for body  aches, headache, will try some Tessalon for the cough although I suspect that it is primarily due to reflux.  She has follow-up with GI next week.  Follow-up with PMD in several days if not getting any better.  Strict ER return precautions given.  COVID-negative.  Plan as above  Discussed labs, imaging, MDM, treatment plan, and plan for follow-up with patient. Discussed sn/sx that should prompt return to the ED. patient agrees with plan.   Meds ordered this encounter  Medications   benzonatate (TESSALON) 200 MG capsule    Sig: Take 1 capsule (200 mg total) by mouth 3 (three) times daily as needed for cough.    Dispense:  30 capsule    Refill:  0     *This clinic note was created using [employer]. Therefore, there may be occasional mistakes despite careful proofreading.  ?    Melynda Ripple, MD 10/14/21 [phone]

## 2021-10-13 NOTE — ED Triage Notes (Signed)
Pt reports cough, low energy, body aches since last night; right sided sore throat x 4 days. States she feels like when she aspirated reflux to the lungs,in 2018 and need up at the hospital.Pt wants to know she is not contagious as she will have a procedure on 10/18/2021. States she will have Botox injected in her esophagus.   Pt reports she called her PCP and was told to have a xray done.

## 2021-10-14 LAB — SARS CORONAVIRUS 2 (TAT 6-24 HRS): SARS Coronavirus 2: NEGATIVE

## 2021-10-15 ENCOUNTER — Encounter (HOSPITAL_COMMUNITY): Payer: Self-pay

## 2021-10-15 ENCOUNTER — Emergency Department (HOSPITAL_COMMUNITY)
Admission: EM | Admit: 2021-10-15 | Discharge: 2021-10-15 | Disposition: A | Discharge status: Home / Self Care | Payer: 59 | Attending: Emergency Medicine | Admitting: Emergency Medicine

## 2021-10-15 ENCOUNTER — Emergency Department (HOSPITAL_COMMUNITY): Payer: 59

## 2021-10-15 ENCOUNTER — Other Ambulatory Visit: Payer: Self-pay

## 2021-10-15 DIAGNOSIS — R630 Anorexia: Secondary | ICD-10-CM | POA: Diagnosis not present

## 2021-10-15 DIAGNOSIS — R059 Cough, unspecified: Secondary | ICD-10-CM | POA: Diagnosis not present

## 2021-10-15 DIAGNOSIS — K219 Gastro-esophageal reflux disease without esophagitis: Secondary | ICD-10-CM | POA: Insufficient documentation

## 2021-10-15 DIAGNOSIS — R0981 Nasal congestion: Secondary | ICD-10-CM | POA: Diagnosis not present

## 2021-10-15 DIAGNOSIS — K6289 Other specified diseases of anus and rectum: Secondary | ICD-10-CM | POA: Insufficient documentation

## 2021-10-15 DIAGNOSIS — Z794 Long term (current) use of insulin: Secondary | ICD-10-CM | POA: Insufficient documentation

## 2021-10-15 DIAGNOSIS — Z20822 Contact with and (suspected) exposure to covid-19: Secondary | ICD-10-CM | POA: Diagnosis not present

## 2021-10-15 DIAGNOSIS — R1032 Left lower quadrant pain: Secondary | ICD-10-CM | POA: Diagnosis not present

## 2021-10-15 LAB — COMPREHENSIVE METABOLIC PANEL
ALT: 27 U/L (ref 0–44)
AST: 31 U/L (ref 15–41)
Albumin: 4.4 g/dL (ref 3.5–5.0)
Alkaline Phosphatase: 69 U/L (ref 38–126)
Anion gap: 7 (ref 5–15)
BUN: 18 mg/dL (ref 8–23)
CO2: 27 mmol/L (ref 22–32)
Calcium: 9.8 mg/dL (ref 8.9–10.3)
Chloride: 107 mmol/L (ref 98–111)
Creatinine, Ser: 0.78 mg/dL (ref 0.44–1.00)
GFR, Estimated: >60 mL/min (ref >60)
Glucose, Bld: 93 mg/dL (ref 70–99)
Potassium: 4.1 mmol/L (ref 3.5–5.1)
Sodium: 141 mmol/L (ref 135–145)
Total Bilirubin: 0.6 mg/dL (ref 0.3–1.2)
Total Protein: 7.8 g/dL (ref 6.5–8.1)

## 2021-10-15 LAB — CBC WITH DIFFERENTIAL/PLATELET
Abs Immature Granulocytes: 0.05 10*3/uL (ref 0.00–0.07)
Basophils Absolute: 0 10*3/uL (ref 0.0–0.1)
Basophils Relative: 1 %
Eosinophils Absolute: 0.3 10*3/uL (ref 0.0–0.5)
Eosinophils Relative: 4 %
HCT: 38.3 % (ref 36.0–46.0)
Hemoglobin: 12.4 g/dL (ref 12.0–15.0)
Immature Granulocytes: 1 %
Lymphocytes Relative: 23 %
Lymphs Abs: 1.5 10*3/uL (ref 0.7–4.0)
MCH: 31.3 pg (ref 26.0–34.0)
MCHC: 32.4 g/dL (ref 30.0–36.0)
MCV: 96.7 fL (ref 80.0–100.0)
Monocytes Absolute: 0.6 10*3/uL (ref 0.1–1.0)
Monocytes Relative: 8 %
Neutro Abs: 4.3 10*3/uL (ref 1.7–7.7)
Neutrophils Relative %: 63 %
Platelets: 158 10*3/uL (ref 150–400)
RBC: 3.96 MIL/uL (ref 3.87–5.11)
RDW: 13.1 % (ref 11.5–15.5)
WBC: 6.7 10*3/uL (ref 4.0–10.5)
nRBC: 0 % (ref 0.0–0.2)

## 2021-10-15 LAB — URINALYSIS, ROUTINE W REFLEX MICROSCOPIC
Bilirubin Urine: NEGATIVE
Glucose, UA: NEGATIVE mg/dL
Hgb urine dipstick: NEGATIVE
Ketones, ur: NEGATIVE mg/dL
Leukocytes,Ua: NEGATIVE
Nitrite: NEGATIVE
Protein, ur: NEGATIVE mg/dL
Specific Gravity, Urine: 1.043 — ABNORMAL HIGH (ref 1.005–1.030)
pH: 6 (ref 5.0–8.0)

## 2021-10-15 LAB — RESP PANEL BY RT-PCR (FLU A&B, COVID) ARPGX2
Influenza A by PCR: NEGATIVE
Influenza B by PCR: NEGATIVE
SARS Coronavirus 2 by RT PCR: NEGATIVE

## 2021-10-15 LAB — LIPASE, BLOOD: Lipase: 23 U/L (ref 11–51)

## 2021-10-15 MED ORDER — CIPROFLOXACIN HCL 500 MG PO TABS: 500.0000 mg | ORAL_TABLET | Freq: Two times a day (BID) | ORAL | 0 refills | Status: AC

## 2021-10-15 MED ORDER — SODIUM CHLORIDE 0.9 % IV SOLN: INTRAVENOUS | Status: DC

## 2021-10-15 MED ORDER — METRONIDAZOLE 500 MG PO TABS
500.0000 mg | ORAL_TABLET | Freq: Once | ORAL | Status: AC
  Administered 2021-10-15: 500 mg via ORAL
  Filled 2021-10-15: qty 1

## 2021-10-15 MED ORDER — CIPROFLOXACIN HCL 500 MG PO TABS
500.0000 mg | ORAL_TABLET | Freq: Once | ORAL | Status: AC
  Administered 2021-10-15: 500 mg via ORAL
  Filled 2021-10-15: qty 1

## 2021-10-15 MED ORDER — ONDANSETRON HCL 4 MG/2ML IJ SOLN
4.0000 mg | Freq: Once | INTRAMUSCULAR | Status: DC
  Filled 2021-10-15: qty 2

## 2021-10-15 MED ORDER — METRONIDAZOLE 500 MG PO TABS: 500.0000 mg | ORAL_TABLET | Freq: Two times a day (BID) | ORAL | 0 refills | Status: AC

## 2021-10-15 MED ORDER — IOHEXOL 350 MG/ML SOLN
80.0000 mL | Freq: Once | INTRAVENOUS | Status: AC | PRN
  Administered 2021-10-15: 80 mL via INTRAVENOUS

## 2021-10-15 NOTE — ED Provider Notes (Signed)
[facility] DEPT Provider Note   CSN: [id number] Arrival date & time: 10/15/21  1518     History Chief Complaint  Patient presents with   Abdominal Pain    Kendra Simmons is a 70 y.o. female.  HPI Patient presents with left lower quadrant abdominal pain, nausea, anorexia.  Patient with multiple medical issues including episode of diverticulitis earlier this year.  She also has chronic esophageal issues and is scheduled for Botox injection in 3 days.  However, the past 4 days she has developed generalized illness, flulike with soreness, cough, congestion, possible dyspnea.  No fever.  She went to urgent care 2 days ago, had negative COVID test, reportedly.  Since that time she has had persistent generalized achiness as well as development of new abdominal pain.  The pain is sore, severe, persistent, circumferential left lateral lower abdomen.  There is associated rectal discomfort.     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   Sepsis without acute organ dysfunction ([place])    Regurgitation of stomach contents    Esophageal dysphagia    Community acquired pneumonia of right upper lobe of lung 12/08/2018   Chronic bilateral low back pain 07/01/2017   Dehydration 07/03/2015   UTI (lower urinary tract infection) 07/03/2015   Hypokalemia 07/03/2015   AKI (acute kidney injury) ([place]) 07/03/2015   Tachycardia 05/27/2015   MDD (major depressive disorder), recurrent, severe, 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   Mild neurocognitive disorder 05/25/2015   Insomnia disorder, with non-sleep  disorder mental comorbidity, 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     Family History  Problem Relation Age of Onset   Sleep apnea Father    Alcohol abuse Father    Diabetes Mother    Diabetes Sister    Diabetes Maternal Uncle    Diabetes Cousin     Social History   Tobacco Use   Smoking status: Never   Smokeless tobacco: Never  Vaping Use   Vaping Use: Never used  Substance Use Topics   Alcohol use: No    Alcohol/week: 0.0 standard drinks    Comment: zero   Drug use: No    Types: Barbituates, Benzodiazepines    Comment: Denies any drug use other than benzos    Home Medications Prior to Admission medications   Medication Sig Start Date End Date Taking? Authorizing Provider  ciprofloxacin (CIPRO) 500 MG tablet Take 1 tablet (500 mg total) by mouth every 12 (twelve) hours for 7 days. 10/15/21  10/22/21 Yes Carmin Muskrat, MD  metroNIDAZOLE (FLAGYL) 500 MG tablet Take 1 tablet (500 mg total) by mouth 2 (two) times daily for 7 days. 10/15/21 10/22/21 Yes Carmin Muskrat, MD  acetaminophen (TYLENOL) 650 MG CR tablet Take 650 mg by mouth every 8 (eight) hours as needed (Arthritis).    [provider]  amitriptyline (ELAVIL) 75 MG tablet Take 1 tablet (75 mg total) by mouth at bedtime. 09/19/21   Raulkar, Clide Deutscher, MD  benzonatate (TESSALON) 200 MG capsule Take 1 capsule (200 mg total) by mouth 3 (three) times daily as needed  for cough. 10/13/21   Melynda Ripple, MD  buPROPion (WELLBUTRIN XL) 150 MG 24 hr tablet TAKE 1 TABLET BY MOUTH EVERY DAY IN THE MORNING 09/21/21   Arfeen, Arlyce Harman, MD  Calcium Citrate-Vitamin D (CALCIUM CITRATE + D3 PO) Take 1 tablet by mouth daily.    [provider]  clonazePAM (KLONOPIN) 0.5 MG tablet Take 1 tablet (0.5 mg total) by mouth daily as needed for anxiety. Patient taking differently: Take 0.5 mg by mouth at bedtime. 09/21/21   Arfeen, Arlyce Harman, MD  diclofenac Sodium (VOLTAREN) 1 % GEL APPLY 2 GRAMS TO AFFECTED AREA 4 TIMES A DAY Patient taking differently: Apply 2 g topically daily as needed (Back and hip pain). 09/18/21   Mcarthur Rossetti, MD  diphenhydramine-acetaminophen (TYLENOL PM) 25-500 MG TABS tablet Take 2 tablets by mouth at bedtime.    [provider]  doxepin (SINEQUAN) 25 MG capsule Take 1 capsule (25 mg total) by mouth at bedtime. 09/21/21   Arfeen, Arlyce Harman, MD  Dulaglutide (TRULICITY) 0.25 [place]/7.0WC SOPN Inject 0.75 mg into the skin once a week.    [provider]  hyoscyamine (ANASPAZ) 0.125 MG TBDP disintergrating tablet Place 0.125 mg under the tongue daily as needed (Choking eposide).    [provider]  ibuprofen (ADVIL,MOTRIN) 200 MG tablet Take 400 mg by mouth every 4 (four) hours as needed for moderate pain.     [provider]  lisinopril (ZESTRIL) 2.5 MG tablet Take 2.5 mg by mouth daily. 01/24/21   [provider]  memantine (NAMENDA) 10 MG tablet Take 1 tablet (10 mg total) by mouth 2 (two) times daily. 06/02/15   Rankin, Shuvon B, NP  methocarbamol (ROBAXIN) 500 MG tablet Take 1 tablet (500 mg total) by mouth every 6 (six) hours as needed for muscle spasms. 04/05/21   Raulkar, Clide Deutscher, MD  Multiple Vitamins-Minerals (MULTIVITAMIN PO) Take 1 tablet by mouth daily. Centrum 50 +    [provider]  Omega-3 Fatty Acids (OMEGA-3 FISH OIL PO) Take 1,000 mg by mouth daily. MIni    [provider]   omeprazole (PRILOSEC) 40 MG capsule Take 40 mg by mouth daily.  09/06/16   [provider]  ondansetron (ZOFRAN ODT) 4 MG disintegrating tablet Take 1 tablet (4 mg total) by mouth every 8 (eight) hours as needed for nausea or vomiting. 02/24/20   Joy, Shawn C, PA-C  polycarbophil (FIBERCON) 625 MG tablet Take 625 mg by mouth daily. gummy    [provider]  polyethylene glycol (MIRALAX / GLYCOLAX) 17 g packet Take 17 g by mouth every other day.    [provider]  pregabalin (LYRICA) 100 MG capsule Take 1 capsule (100 mg total) by mouth at bedtime. 08/17/21   Raulkar, Clide Deutscher, MD  Probiotic Product (PROBIOTIC PO) Take 1 tablet by mouth daily. gummy    [provider]  QUEtiapine (SEROQUEL)  300 MG tablet Take 1 tablet (300 mg total) by mouth at bedtime. Patient taking differently: Take 400 mg by mouth at bedtime. 08/28/21   Raulkar, Clide Deutscher, MD  ramelteon (ROZEREM) 8 MG tablet Take 1 tablet (8 mg total) by mouth at bedtime. Patient not taking: No sig reported 12/21/20   Arfeen, Arlyce Harman, MD  rosuvastatin (CRESTOR) 20 MG tablet Take 20 mg by mouth at bedtime. 01/26/21   [provider]  sucralfate (CARAFATE) 1 GM/10ML suspension Take 10 mLs (1 g total) by mouth 4 (four) times daily -  with meals and at bedtime. Patient taking differently: Take 1 g by mouth daily as needed (stomach). 12/11/18   Black, Lezlie Octave, NP  tamsulosin (FLOMAX) 0.4 MG CAPS capsule Take 1 capsule (0.4 mg total) by mouth daily after supper. For frequent urination/urgency 06/02/15   Rankin, Shuvon B, NP  tiZANidine (ZANAFLEX) 4 MG tablet TAKE 1 TABLET BY MOUTH THREE TIMES A DAY Patient taking differently: Take 12 mg by mouth at bedtime. 09/25/21   Raulkar, Clide Deutscher, MD  TRULICITY 3 NW/2.9FA SOPN Inject 0.5 mg into the skin once a week. 03/17/21   [provider]    Allergies    Phentermine and Augmentin [amoxicillin-pot clavulanate]  Review of Systems   Review of Systems   Constitutional:        Per HPI, otherwise negative  HENT:         Per HPI, otherwise negative  Respiratory:         Per HPI, otherwise negative  Cardiovascular:        Per HPI, otherwise negative  Gastrointestinal:  Negative for vomiting.  Endocrine:       Negative aside from HPI  Genitourinary:        Neg aside from HPI   Musculoskeletal:        Per HPI, otherwise negative  Skin: Negative.   Neurological:  Negative for syncope.   Physical Exam Updated Vital Signs BP (!) 146/78 (BP Location: Left Arm)   Pulse 88   Temp 97.8 F (36.6 C) (Oral)   Resp 17   Ht 5\' 4"  (1.626 m)   Wt 84.8 kg   SpO2 99%   BMI 32.10 kg/m   Physical Exam Vitals and nursing note reviewed.  Constitutional:      General: She is not in acute distress.    Appearance: She is well-developed.  HENT:     Head: Normocephalic and atraumatic.  Eyes:     Conjunctiva/sclera: Conjunctivae normal.  Cardiovascular:     Rate and Rhythm: Normal rate and regular rhythm.  Pulmonary:     Effort: Pulmonary effort is normal. No respiratory distress.     Breath sounds: Normal breath sounds. No stridor.  Abdominal:     General: There is no distension.     Tenderness: There is abdominal tenderness in the left lower quadrant.  Skin:    General: Skin is warm and dry.  Neurological:     Mental Status: She is alert and oriented to person, place, and time.     Cranial Nerves: No cranial nerve deficit.    ED Results / Procedures / Treatments   Labs (all labs ordered are listed, but only abnormal results are displayed) Labs Reviewed  URINALYSIS, ROUTINE W REFLEX MICROSCOPIC - Abnormal; Notable for the following components:      Result Value   APPearance CLOUDY (*)    Specific Gravity, Urine 1.043 (*)    All other components  within normal limits  RESP PANEL BY RT-PCR (FLU A&B, COVID) [id number]  COMPREHENSIVE METABOLIC PANEL  LIPASE, BLOOD  CBC WITH DIFFERENTIAL/PLATELET    EKG None  Radiology DG Chest 2  View  Result Date: 10/15/2021 CLINICAL DATA:  Shortness of breath EXAM: CHEST - 2 VIEW COMPARISON:  10/13/2021 FINDINGS: The heart size and mediastinal contours are within normal limits. Both lungs are clear. The visualized skeletal structures are unremarkable. IMPRESSION: No active cardiopulmonary disease. Electronically Signed   By: Kathreen Devoid M.D.   On: 10/15/2021 15:59   CT ABDOMEN PELVIS W CONTRAST  Result Date: 10/15/2021 CLINICAL DATA:  Left lower quadrant abdominal pain. EXAM: CT ABDOMEN AND PELVIS WITH CONTRAST TECHNIQUE: Multidetector CT imaging of the abdomen and pelvis was performed using the standard protocol following bolus administration of intravenous contrast. CONTRAST:  32mL OMNIPAQUE IOHEXOL 350 MG/ML SOLN COMPARISON:  March 21, 2021. FINDINGS: Lower chest: Multiple ill-defined opacities are noted in the right lung base which may represent multifocal inflammation or atelectasis. Hepatobiliary: Solitary gallstone is noted. No biliary dilatation is noted. Liver is unremarkable. Pancreas: Unremarkable. No pancreatic ductal dilatation or surrounding inflammatory changes. Spleen: Normal in size without focal abnormality. Adrenals/Urinary Tract: Adrenal glands are unremarkable. Kidneys are normal, without renal calculi, focal lesion, or hydronephrosis. Bladder is unremarkable. Stomach/Bowel: Stomach is within normal limits. Appendix appears normal. No evidence of bowel wall thickening, distention, or inflammatory changes. Vascular/Lymphatic: No significant vascular findings are present. No enlarged abdominal or pelvic lymph nodes. Reproductive: Uterus and bilateral adnexa are unremarkable. Other: No abdominal wall hernia or abnormality. No abdominopelvic ascites. Musculoskeletal: No acute or significant osseous findings. IMPRESSION: Multiple ill-defined opacities are noted in the right lung base which may represent multifocal inflammation or atelectasis. Solitary gallstone.  No evidence of  cholecystitis. No other abnormality seen in the abdomen or pelvis. Electronically Signed   By: Marijo Conception M.D.   On: 10/15/2021 17:20    Procedures Procedures   Medications Ordered in ED Medications  0.9 %  sodium chloride infusion (0 mLs Intravenous Stopped 10/15/21 1938)  ondansetron (ZOFRAN) injection 4 mg (0 mg Intravenous Hold 10/15/21 1652)  ciprofloxacin (CIPRO) tablet 500 mg (has no administration in time range)  metroNIDAZOLE (FLAGYL) tablet 500 mg (has no administration in time range)  iohexol (OMNIPAQUE) 350 MG/ML injection 80 mL (80 mLs Intravenous Contrast Given 10/15/21 1659)    ED Course  I have reviewed the triage vital signs and the nursing notes.  Pertinent labs & imaging results that were available during my care of the patient were reviewed by me and considered in my medical decision making (see chart for details).   7:41 PM Patient in no distress, awake, alert, hemodynamically unremarkable.  Of reviewed CT, labs, x-ray, urinalysis.  Findings generally reassuring.  No evidence of bacteremia, sepsis.  She has no dyspnea, no cough, x-ray likely atelectasis. With her description of left lower quadrant abdominal pain history of diverticulitis there is some suspicion for clinical diagnosis, but fortunately no evidence for perforation, or abscess. Patient amenable to course of antibiotics, follow-up with GI.  With otherwise reassuring findings, patient discharged in stable condition.  Final Clinical Impression(s) / ED Diagnoses Final diagnoses:  Left lower quadrant abdominal pain    Rx / DC Orders ED Discharge Orders          Ordered    metroNIDAZOLE (FLAGYL) 500 MG tablet  2 times daily        10/15/21 1941    ciprofloxacin (CIPRO) 500 MG  tablet  Every 12 hours        10/15/21 1941             Carmin Muskrat, MD 10/15/21 [phone]

## 2021-10-15 NOTE — ED Triage Notes (Signed)
Pt states that she is having LLQ pain radiating the the RLQ, concerned for diverticulitis. Pt also endorses SHOB and feeling like "her bottom is falling out"

## 2021-10-15 NOTE — Discharge Instructions (Signed)
As discussed, today's labs, CT scan has been generally reassuring.  With your history, and any pain, you are starting a short course of antibiotics.  Presumption is that you have diverticulitis.  Please discuss this with your gastroenterologist via telephone tomorrow and arrange appropriate ongoing outpatient care.  Return here for concerning changes in your condition.

## 2021-10-16 NOTE — Anesthesia Preprocedure Evaluation (Addendum)
Anesthesia Evaluation  Patient identified by MRN, date of birth, ID band Patient awake    Reviewed: Allergy & Precautions, NPO status , Patient's Chart, lab work & pertinent test results  Airway Mallampati: IV  TM Distance: >3 FB Neck ROM: Full  Mouth opening: Limited Mouth Opening  Dental  (+) Chipped, Dental Advisory Given,    Pulmonary neg pulmonary ROS,    Pulmonary exam normal breath sounds clear to auscultation       Cardiovascular hypertension, Pt. on medications Normal cardiovascular exam Rhythm:Regular Rate:Normal     Neuro/Psych PSYCHIATRIC DISORDERS Anxiety Depression negative neurological ROS     GI/Hepatic Neg liver ROS, GERD  Medicated and Controlled,  Endo/Other  negative endocrine ROS  Renal/GU negative Renal ROS  negative genitourinary   Musculoskeletal  (+) Arthritis ,   Abdominal   Peds  Hematology negative hematology ROS (+)   Anesthesia Other Findings   Reproductive/Obstetrics                            Anesthesia Physical Anesthesia Plan  ASA: 2  Anesthesia Plan: MAC   Post-op Pain Management:    Induction: Intravenous  PONV Risk Score and Plan: Propofol infusion and Treatment may vary due to age or medical condition  Airway Management Planned: Natural Airway  Additional Equipment:   Intra-op Plan:   Post-operative Plan:   Informed Consent: I have reviewed the patients History and Physical, chart, labs and discussed the procedure including the risks, benefits and alternatives for the proposed anesthesia with the patient or authorized representative who has indicated his/her understanding and acceptance.     Dental advisory given  Plan Discussed with: CRNA  Anesthesia Plan Comments:         Anesthesia Quick Evaluation

## 2021-10-17 ENCOUNTER — Encounter (HOSPITAL_COMMUNITY)
Admission: RE | Disposition: A | Discharge status: Home / Self Care | Payer: Self-pay | Source: Home / Self Care | Attending: Gastroenterology

## 2021-10-17 ENCOUNTER — Ambulatory Visit (HOSPITAL_COMMUNITY)
Admission: RE | Admit: 2021-10-17 | Discharge: 2021-10-17 | Disposition: A | Discharge status: Home / Self Care | Payer: 59 | Attending: Gastroenterology | Admitting: Gastroenterology

## 2021-10-17 ENCOUNTER — Ambulatory Visit (HOSPITAL_COMMUNITY): Payer: 59 | Admitting: Certified Registered Nurse Anesthetist

## 2021-10-17 ENCOUNTER — Encounter (HOSPITAL_COMMUNITY): Payer: Self-pay | Admitting: Gastroenterology

## 2021-10-17 ENCOUNTER — Other Ambulatory Visit: Payer: Self-pay

## 2021-10-17 DIAGNOSIS — K219 Gastro-esophageal reflux disease without esophagitis: Secondary | ICD-10-CM | POA: Insufficient documentation

## 2021-10-17 DIAGNOSIS — R1314 Dysphagia, pharyngoesophageal phase: Secondary | ICD-10-CM | POA: Diagnosis not present

## 2021-10-17 DIAGNOSIS — Z79899 Other long term (current) drug therapy: Secondary | ICD-10-CM | POA: Insufficient documentation

## 2021-10-17 DIAGNOSIS — F32A Depression, unspecified: Secondary | ICD-10-CM | POA: Insufficient documentation

## 2021-10-17 DIAGNOSIS — R1013 Epigastric pain: Secondary | ICD-10-CM | POA: Diagnosis present

## 2021-10-17 DIAGNOSIS — I10 Essential (primary) hypertension: Secondary | ICD-10-CM | POA: Insufficient documentation

## 2021-10-17 DIAGNOSIS — M1990 Unspecified osteoarthritis, unspecified site: Secondary | ICD-10-CM | POA: Diagnosis not present

## 2021-10-17 DIAGNOSIS — F419 Anxiety disorder, unspecified: Secondary | ICD-10-CM | POA: Insufficient documentation

## 2021-10-17 HISTORY — PX: ESOPHAGOGASTRODUODENOSCOPY (EGD) WITH PROPOFOL: SHX5813

## 2021-10-17 HISTORY — PX: BOTOX INJECTION: SHX5754

## 2021-10-17 SURGERY — ESOPHAGOGASTRODUODENOSCOPY (EGD) WITH PROPOFOL
Anesthesia: Monitor Anesthesia Care

## 2021-10-17 MED ORDER — SODIUM CHLORIDE 0.9 % IV SOLN: INTRAVENOUS | Status: DC

## 2021-10-17 MED ORDER — ONABOTULINUMTOXINA 100 UNITS IJ SOLR
INTRAMUSCULAR | Status: AC
  Filled 2021-10-17: qty 100

## 2021-10-17 MED ORDER — PROPOFOL 1000 MG/100ML IV EMUL
INTRAVENOUS | Status: AC
  Filled 2021-10-17: qty 100

## 2021-10-17 MED ORDER — LACTATED RINGERS IV SOLN: INTRAVENOUS | Status: DC | PRN

## 2021-10-17 MED ORDER — SODIUM CHLORIDE (PF) 0.9 % IJ SOLN
INTRAMUSCULAR | Status: DC | PRN
  Administered 2021-10-17: 4 mL via SUBMUCOSAL

## 2021-10-17 MED ORDER — SODIUM CHLORIDE (PF) 0.9 % IJ SOLN
INTRAMUSCULAR | Status: AC
  Filled 2021-10-17: qty 10

## 2021-10-17 MED ORDER — PROPOFOL 10 MG/ML IV BOLUS
INTRAVENOUS | Status: DC | PRN
  Administered 2021-10-17: 20 mg via INTRAVENOUS
  Administered 2021-10-17 (×3): 25 mg via INTRAVENOUS

## 2021-10-17 MED ORDER — PROPOFOL 500 MG/50ML IV EMUL
INTRAVENOUS | Status: DC | PRN
  Administered 2021-10-17: 125 ug/kg/min via INTRAVENOUS

## 2021-10-17 MED ORDER — LIDOCAINE 2% (20 MG/ML) 5 ML SYRINGE
INTRAMUSCULAR | Status: DC | PRN
  Administered 2021-10-17: 80 mg via INTRAVENOUS

## 2021-10-17 SURGICAL SUPPLY — 15 items

## 2021-10-17 NOTE — Op Note (Signed)
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 Estimated Blood Loss:     Estimated blood loss: none. Procedure:                Pre-Anesthesia Assessment:                           - Prior to the procedure, a History and Physical                            was performed, and patient medications and                            allergies were reviewed. The patient's tolerance of                            previous anesthesia was also reviewed. The risks                            and benefits of the procedure and the sedation                            options and risks were discussed with the patient.                            All questions were answered, and informed consent                            was obtained. Prior Anticoagulants: The patient has                            taken no previous anticoagulant or antiplatelet                            agents. ASA Grade Assessment: II - A patient with  mild systemic disease. After reviewing the risks                            and benefits, the patient was deemed in                            satisfactory condition to undergo the procedure.                           After obtaining informed consent, the endoscope was                            passed under direct vision. Throughout the                             procedure, the patient's blood pressure, pulse, and                            oxygen saturations were monitored continuously. The                            [id number]-[id number] ([id number] Olympus endoscope was introduced                            through the mouth, and advanced to the third part                            of duodenum. The upper GI endoscopy was                            accomplished without difficulty. The patient                            tolerated the procedure well. Scope In: Scope Out: Findings:      The larynx was normal.      No appreciable esophageal motility was noted. In addition, a hypertonic       lower esophageal sphincter was found. There was no resistance to       endoscope advancement into the stomach. The Z-line was regular. The Too       much food was in the cardia to fully evaluate were normal on retroflexed       view. Area was successfully injected with 100 units botulinum toxin.      A medium amount of food (residue) was found in the cardia, in the       gastric fundus and in the proximal gastric body.      The duodenal bulb, first portion of the duodenum, second portion of the       duodenum and third portion of the duodenum were normal.      The exam was otherwise without abnormality. Impression:               - Normal larynx.                           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 Injected with botulinum toxin.                           - A medium amount of food (residue) in the stomach.                           - Normal duodenal bulb, first portion of the                            duodenum, second portion of the duodenum and third                            portion of the duodenum.                           - The examination was otherwise normal.                           - No specimens collected. Moderate Sedation:      Not Applicable - Patient had care per Anesthesia. Recommendation:            - Patient has a contact number available for                            emergencies. The signs and symptoms of potential                            delayed complications were discussed with the                            patient. Return to normal activities tomorrow.                            Written discharge instructions were provided to the                            patient.                           - Soft diet today.                           - Continue present medications.                           - Return to GI clinic in 4 weeks.                           - Telephone GI clinic if symptomatic PRN.                           - Do a gastric emptying study if symptoms persist.                           - Refer to a surgeon if symptoms persist for  possible cholecystectomy. Procedure Code(s):        --- Professional ---                           [phone], Esophagogastroduodenoscopy, flexible,                            transoral; with directed submucosal injection(s),                            any substance Diagnosis Code(s):        --- Professional ---                           R10.13, Epigastric pain                           R13.10, Dysphagia, unspecified CPT copyright 2019 [employer]. All rights reserved. The codes documented in this report are preliminary and upon coder review may  be revised to meet current compliance requirements. Clarene Essex, MD 10/17/2021 8:11:20 AM This report has been signed electronically. Number of Addenda: 0

## 2021-10-17 NOTE — Transfer of Care (Signed)
Immediate Anesthesia Transfer of Care Note  Patient: Kendra Simmons  Procedure(s) Performed: ESOPHAGOGASTRODUODENOSCOPY (EGD) WITH PROPOFOL BOTOX INJECTION  Patient Location: PACU  Anesthesia Type:MAC  Level of Consciousness: awake, alert , oriented and patient cooperative  Airway & Oxygen Therapy: Patient Spontanous Breathing and Patient connected to face mask oxygen  Post-op Assessment: Report given to RN and Post -op Vital signs reviewed and stable  Post vital signs: Reviewed and stable  Last Vitals:  Vitals Value Taken Time  BP    Temp    Pulse    Resp    SpO2 100% 10/17/2021 0804    Last Pain:  Vitals:   10/17/21 0728  TempSrc: Oral  PainSc: 0-No pain         Complications: No notable events documented.

## 2021-10-17 NOTE — Discharge Instructions (Addendum)
YOU HAD AN ENDOSCOPIC PROCEDURE TODAY: Refer to the procedure report and other information in the discharge instructions given to you for any specific questions about what was found during the examination. If this information does not answer your questions, please call [facility] office at [phone] to clarify.   YOU SHOULD EXPECT: Some feelings of bloating in the abdomen. Passage of more gas than usual. Walking can help get rid of the air that was put into your GI tract during the procedure and reduce the bloating.  DIET: Your first meal following the procedure should be a light meal and then it is ok to progress to your normal diet. A half-sandwich or bowl of soup is an example of a good first meal. Heavy or fried foods are harder to digest and may make you feel nauseous or bloated. Drink plenty of fluids but you should avoid alcoholic beverages for 24 hours.   ACTIVITY: Your care partner should take you home directly after the procedure. You should plan to take it easy, moving slowly for the rest of the day. You can resume normal activity the day after the procedure however YOU SHOULD NOT DRIVE, use power tools, machinery or perform tasks that involve climbing or major physical exertion for 24 hours (because of the sedation medicines used during the test).   SYMPTOMS TO REPORT IMMEDIATELY: A gastroenterologist can be reached at any hour. Please call [phone]  for any of the following symptoms:   Following upper endoscopy (EGD, EUS, ERCP, esophageal dilation) Vomiting of blood or coffee ground material  New, significant abdominal pain  New, significant chest pain or pain under the shoulder blades  Painful or persistently difficult swallowing  New shortness of breath  Black, tarry-looking or red, bloody stools  FOLLOW UP:  If any biopsies were taken you will be contacted by phone or by letter within the next 1-3 weeks. Call [phone]  if you have not heard about the biopsies in 3 weeks.   Please also call with any specific questions about appointments or follow up tests.    Call if question or problem otherwise follow-up in the office in 1 month and begin today with soft solids and eat slowly take small bites and drink plenty of liquids and stay upright for 2 hours after eating

## 2021-10-17 NOTE — Anesthesia Postprocedure Evaluation (Signed)
Anesthesia Post Note  Patient: Public affairs consultant  Procedure(s) Performed: ESOPHAGOGASTRODUODENOSCOPY (EGD) WITH PROPOFOL BOTOX INJECTION     Patient location during evaluation: Endoscopy Anesthesia Type: MAC Level of consciousness: awake and alert Pain management: pain level controlled Vital Signs Assessment: post-procedure vital signs reviewed and stable Respiratory status: spontaneous breathing, nonlabored ventilation, respiratory function stable and patient connected to nasal cannula oxygen Cardiovascular status: blood pressure returned to baseline and stable Postop Assessment: no apparent nausea or vomiting Anesthetic complications: no   No notable events documented.  Last Vitals:  Vitals:   10/17/21 0820 10/17/21 0830  BP: (!) 148/58 (!) 148/72  Pulse: 81 78  Resp: 14 11  Temp:    SpO2: 98% 98%    Last Pain:  Vitals:   10/17/21 0830  TempSrc:   PainSc: 0-No pain                 Kendra Simmons

## 2021-10-17 NOTE — Progress Notes (Signed)
[place] 7:42 AM  Subjective: Patient with increased pain and difficulty swallowing no other new complaints she was seen in our office we rediscussed the procedure and answered all of her questions  Objective: Vital signs stable afebrile exam please see preassessment evaluation recent labs okay recent CT reviewed  Assessment: Upper tract symptoms and patient with probable achalasia  Plan: Okay to proceed with endoscopy and possible dilation but probable Botox with anesthesia assistance  [facility]  office [phone] After 5PM or if no answer call [phone]

## 2021-10-17 NOTE — Anesthesia Procedure Notes (Signed)
Procedure Name: MAC Date/Time: 10/17/2021 7:45 AM Performed by: West Pugh, CRNA Pre-anesthesia Checklist: Patient identified, Emergency Drugs available, Suction available and Patient being monitored Patient Re-evaluated:Patient Re-evaluated prior to induction Oxygen Delivery Method: Simple face mask Preoxygenation: Pre-oxygenation with 100% oxygen Induction Type: IV induction Dental Injury: Teeth and Oropharynx as per pre-operative assessment

## 2021-10-20 ENCOUNTER — Encounter (HOSPITAL_COMMUNITY): Payer: Self-pay | Admitting: Gastroenterology

## 2021-11-03 ENCOUNTER — Other Ambulatory Visit (HOSPITAL_COMMUNITY): Payer: Self-pay | Admitting: Psychiatry

## 2021-11-03 ENCOUNTER — Other Ambulatory Visit: Payer: Self-pay | Admitting: Physical Medicine and Rehabilitation

## 2021-11-03 DIAGNOSIS — F330 Major depressive disorder, recurrent, mild: Secondary | ICD-10-CM

## 2021-11-18 ENCOUNTER — Other Ambulatory Visit: Payer: Self-pay | Admitting: Physical Medicine and Rehabilitation

## 2021-11-22 ENCOUNTER — Other Ambulatory Visit: Payer: Self-pay | Admitting: Physical Medicine and Rehabilitation

## 2021-11-24 ENCOUNTER — Telehealth: Payer: Self-pay

## 2021-11-24 NOTE — Telephone Encounter (Signed)
Pt called stating she fell Wednesday morning and shes hurting all over , every day the pain gets worser specially in her ribs neck and shoulders would like something to aliviate the pain . Please advise .

## 2021-11-28 ENCOUNTER — Other Ambulatory Visit: Payer: Self-pay

## 2021-11-28 ENCOUNTER — Encounter: Payer: 59 | Attending: Physical Medicine and Rehabilitation | Admitting: Physical Medicine and Rehabilitation

## 2021-11-28 VITALS — BP 132/83 | HR 89 | Temp 98.1°F | Ht 64.0 in | Wt 190.0 lb

## 2021-11-28 DIAGNOSIS — M7061 Trochanteric bursitis, right hip: Secondary | ICD-10-CM | POA: Diagnosis not present

## 2021-11-28 DIAGNOSIS — M7062 Trochanteric bursitis, left hip: Secondary | ICD-10-CM | POA: Diagnosis not present

## 2021-11-28 MED ORDER — AMITRIPTYLINE HCL 100 MG PO TABS: 100.0000 mg | ORAL_TABLET | Freq: Every day | ORAL | 11 refills | Status: DC

## 2021-11-28 NOTE — Progress Notes (Signed)
Trochanteric bursa injection, bilateral  Indication Trochanteric bursitis. Exam has tenderness over the greater trochanter of the hip. Pain has not responded to conservative care such as exercise therapy and oral medications. Pain interferes with sleep or with mobility Informed consent was obtained after describing risks and benefits of the procedure with the patient these include bleeding bruising and infection. Patient has signed written consent form. Patient placed in a lateral decubitus position with the affected hip superior. Point of maximal pain was palpated marked and prepped with Betadine and entered with a needle to bone contact. Needle slightly withdrawn then 1cc celestone with 4 cc 1% lidocaine were injected. Patient tolerated procedure well. Post procedure instructions given.

## 2021-11-28 NOTE — Patient Instructions (Signed)
NAC 600mg BID 

## 2021-12-09 ENCOUNTER — Other Ambulatory Visit: Payer: Self-pay | Admitting: Physical Medicine and Rehabilitation

## 2021-12-11 ENCOUNTER — Other Ambulatory Visit: Payer: Self-pay | Admitting: Physical Medicine and Rehabilitation

## 2021-12-16 ENCOUNTER — Other Ambulatory Visit (HOSPITAL_COMMUNITY): Payer: Self-pay | Admitting: Psychiatry

## 2021-12-16 DIAGNOSIS — F330 Major depressive disorder, recurrent, mild: Secondary | ICD-10-CM

## 2021-12-16 DIAGNOSIS — F411 Generalized anxiety disorder: Secondary | ICD-10-CM

## 2021-12-20 ENCOUNTER — Other Ambulatory Visit (HOSPITAL_COMMUNITY): Payer: Self-pay | Admitting: Psychiatry

## 2021-12-20 DIAGNOSIS — F330 Major depressive disorder, recurrent, mild: Secondary | ICD-10-CM

## 2021-12-23 ENCOUNTER — Other Ambulatory Visit: Payer: Self-pay | Admitting: Physical Medicine and Rehabilitation

## 2021-12-25 ENCOUNTER — Other Ambulatory Visit: Payer: Self-pay | Admitting: Physical Medicine and Rehabilitation

## 2021-12-25 ENCOUNTER — Other Ambulatory Visit: Payer: Self-pay

## 2021-12-25 ENCOUNTER — Telehealth (HOSPITAL_BASED_OUTPATIENT_CLINIC_OR_DEPARTMENT_OTHER): Payer: 59 | Admitting: Psychiatry

## 2021-12-25 ENCOUNTER — Encounter (HOSPITAL_COMMUNITY): Payer: Self-pay | Admitting: Psychiatry

## 2021-12-25 VITALS — Wt 190.0 lb

## 2021-12-25 DIAGNOSIS — F5101 Primary insomnia: Secondary | ICD-10-CM

## 2021-12-25 DIAGNOSIS — F330 Major depressive disorder, recurrent, mild: Secondary | ICD-10-CM

## 2021-12-25 DIAGNOSIS — F411 Generalized anxiety disorder: Secondary | ICD-10-CM | POA: Diagnosis not present

## 2021-12-25 MED ORDER — BUPROPION HCL ER (XL) 150 MG PO TB24: ORAL_TABLET | ORAL | 0 refills | Status: DC

## 2021-12-25 MED ORDER — CLONAZEPAM 0.5 MG PO TABS: 0.5000 mg | ORAL_TABLET | Freq: Every day | ORAL | 0 refills | Status: DC

## 2021-12-25 NOTE — Progress Notes (Signed)
Virtual Visit via Telephone Note  I connected with Kendra Simmons on 12/25/21 at  2:40 PM EST by telephone and verified that I am speaking with the correct person using two identifiers.  Location: Patient: Home Provider: Home Office   I discussed the limitations, risks, security and privacy concerns of performing an evaluation and management service by telephone and the availability of in person appointments. I also discussed with the patient that there may be a patient responsible charge related to this service. The patient expressed understanding and agreed to proceed.   History of Present Illness: Patient is evaluated by phone session.  She is taking amitriptyline 100 mg which is recently prescribed but increase by PCP.  She is no longer taking doxepin.  She reported her sleep is good and her mood is stable and denies any paranoia, hallucination or any crying spells.  However she still gets frustrated with her 47 year old daughter who lives with her.  Patient believes she has bipolar but does not take the medication.  Patient told she cannot convince her to take the medicine but she is relieved that her daughter is at least working.  Patient told her therapist moved to [place] and now she is looking for a new therapist.  I reviewed her medication.  She is now taking doxepin.  She was also prescribed 50 mg of Seroquel in addition to 300 but she is not taking it.  She takes Klonopin every night.  Her sleep is good.  She denies any panic attack.  Her appetite is okay.  Her weight is stable.   Past Psychiatric History:  H/O multiple inpatient and IOP. Last inpatient June 2016. H/O hallucination, paranoia and depression. Saw [name], [name] and Dr. Thurmond Butts.  Tried Xanax, amitriptyline, imipramine, temazepam, trazodone, Zyprexa, Luvox, Wellbutrin, Prozac, Vistaril, Remeron and Bellsomra.     Psychiatric Specialty Exam: Physical Exam  Review of Systems  Weight 190 lb (86.2 kg).Body mass  index is 32.61 kg/m.  General Appearance: NA  Eye Contact:  NA  Speech:  Slow  Volume:  Normal  Mood:  Euthymic  Affect:  NA  Thought Process:  Goal Directed  Orientation:  Full (Time, Place, and Person)  Thought Content:  Rumination  Suicidal Thoughts:  No  Homicidal Thoughts:  No  Memory:  Immediate;   Good Recent;   Good Remote;   Fair  Judgement:  Intact  Insight:  Present  Psychomotor Activity:  NA  Concentration:  Concentration: Fair and Attention Span: Fair  Recall:  [employer] of Knowledge:  Good  Language:  Good  Akathisia:  No  Handed:  Right  AIMS (if indicated):     Assets:  Communication Skills Desire for Improvement Housing Social Support  ADL's:  Intact  Cognition:  WNL  Sleep:   good      Assessment and Plan: Major depressive disorder, recurrent.  Generalized anxiety disorder.  Primary insomnia.  Discontinue doxepin as patient is not taking.  Her PCP given amitriptyline and she is taking 100 mg.  She is also taking Seroquel 300 mg which was recently prescribed by PCP.  We will continue Wellbutrin XL 150 mg in the morning and Klonopin 0.5 mg at bedtime.  She is still taking Lyrica and muscle relaxant.  We discuss polypharmacy.  Patient like to have referrals for counseling.  She endorsed having issues with the daughter which are chronic.  We will provide information.  Recommended to call us back if she has any question  or any concern.  Follow-up in 3 months.  Follow Up Instructions:    I discussed the assessment and treatment plan with the patient. The patient was provided an opportunity to ask questions and all were answered. The patient agreed with the plan and demonstrated an understanding of the instructions.   The patient was advised to call back or seek an in-person evaluation if the symptoms worsen or if the condition fails to improve as anticipated.  I provided 21 minutes of non-face-to-face time during this encounter.   [name], MD

## 2021-12-27 ENCOUNTER — Other Ambulatory Visit: Payer: Self-pay | Admitting: Physical Medicine and Rehabilitation

## 2021-12-27 ENCOUNTER — Telehealth: Payer: Self-pay

## 2021-12-27 MED ORDER — TIZANIDINE HCL 4 MG PO TABS: 4.0000 mg | ORAL_TABLET | Freq: Three times a day (TID) | ORAL | 0 refills | Status: DC

## 2021-12-27 MED ORDER — PREGABALIN 100 MG PO CAPS: 100.0000 mg | ORAL_CAPSULE | Freq: Every day | ORAL | 2 refills | Status: DC

## 2021-12-27 NOTE — Telephone Encounter (Signed)
Patient notified of medication refills sent to pharmacy

## 2021-12-27 NOTE — Telephone Encounter (Signed)
Patient called and stated she spoke with her Psychiatrist and he is okay with Dr. Ranell Patrick handling the Seroquel. She also stated she needs refills on the Lyrica and Tizanidine.

## 2022-01-02 ENCOUNTER — Telehealth: Payer: Self-pay

## 2022-01-02 ENCOUNTER — Other Ambulatory Visit: Payer: Self-pay | Admitting: Physical Medicine and Rehabilitation

## 2022-01-02 NOTE — Telephone Encounter (Signed)
Pt called stating she needs a refill on methocarbamol , but  states its also not helping much , and has severe pain in back to the point pt cant bend over and is wanting to know if there is another medication that can help with her pain .

## 2022-01-02 NOTE — Telephone Encounter (Signed)
She has been scheduled dr . Ranell Patrick thank you

## 2022-01-09 ENCOUNTER — Other Ambulatory Visit: Payer: Self-pay

## 2022-01-09 ENCOUNTER — Encounter: Payer: 59 | Attending: Physical Medicine and Rehabilitation | Admitting: Physical Medicine and Rehabilitation

## 2022-01-09 ENCOUNTER — Other Ambulatory Visit: Payer: Self-pay | Admitting: Family Medicine

## 2022-01-09 DIAGNOSIS — R404 Transient alteration of awareness: Secondary | ICD-10-CM

## 2022-01-09 DIAGNOSIS — M7062 Trochanteric bursitis, left hip: Secondary | ICD-10-CM | POA: Insufficient documentation

## 2022-01-09 DIAGNOSIS — F330 Major depressive disorder, recurrent, mild: Secondary | ICD-10-CM | POA: Diagnosis not present

## 2022-01-09 DIAGNOSIS — M7061 Trochanteric bursitis, right hip: Secondary | ICD-10-CM | POA: Diagnosis not present

## 2022-01-09 DIAGNOSIS — R269 Unspecified abnormalities of gait and mobility: Secondary | ICD-10-CM

## 2022-01-10 MED ORDER — METHOCARBAMOL 500 MG PO TABS: 500.0000 mg | ORAL_TABLET | Freq: Four times a day (QID) | ORAL | 3 refills | Status: DC | PRN

## 2022-01-10 MED ORDER — AMITRIPTYLINE HCL 150 MG PO TABS: 150.0000 mg | ORAL_TABLET | Freq: Every day | ORAL | 3 refills | Status: DC

## 2022-01-10 MED ORDER — PREGABALIN 100 MG PO CAPS: 100.0000 mg | ORAL_CAPSULE | Freq: Every day | ORAL | 2 refills | Status: DC

## 2022-01-10 NOTE — Progress Notes (Signed)
Subjective:    Patient ID: Kendra Simmons, female    DOB: 10-08-1951, 71 y.o.   MRN: [id number]  HPI  An audio/video tele-health visit is felt to be the most appropriate encounter for this patient at this time. This is a follow up tele-visit via phone. The patient is at home. MD is at office. Prior to scheduling this appointment, our staff discussed the limitations of evaluation and management by telemedicine and the availability of in-person appointments. The patient expressed understanding and agreed to proceed.   Kendra Simmons is a 71 year old woman who presents for f/u of anxiety, pain, depression, trochanteric bursitis, and insomnia.   Since last visit, she has still been having bilateral hip pain secondary to greater trochanteric pain syndrome. Her pain is also present in her hip joint, scapula. She has some benefit from the steroid injection.   1) Bilateral greater trochanteric pain syndrome -only received 1 week of benefit from steroid injections -She has been taking Robaxin- sometimes none, sometimes up 2 two pills per day and she asks if this is ok -She has been using voltaren gel -She has ordered a pillow to help offload her lateral hips- she will get this 3/10 -pain continues to be quite severe and interferes with her sleep.  -pain has been really bad -she asks if it is ok to take up to 3 Tizanidine at night.    2) Cervical myofascial pain syndrome: -muscles feel very tight near her scapula and upper back -She has been using heating pad every day.  -She notes poor posture.  -She has had injections in her upper back before.   3) Obesity: -She got down to 180 and climbed back up again to 192.  -BMI is 32.96 -She has been considering lap band surgery. She is not sure if she would be approved for this.  -She plans to walk more in the summer.  3) Prediabetes  4) HLD  5) Depression:  -She takes Wellbutrin.  -she has been following with a psychiatrist -her  daughter often does not talk to her and when she does she can be very hurtful to her -she fears that one day her daughter may put her in a nursing home -she does have a really close friend of 45 years whom she is seeing the show Cats with, she was very happy on Christmas when her friend gave her these tickers -her son used to be very loving but has been more involved with his wife and children and has not seen her for 7 years. He is her power of attorney.   6) Anxiety: she has been ruminating a lot.  -this prevents her from sleeping at night  7) Insomnia:  -She has been taking Seroquel and it seems to have stopped helping, but she has discussed with her psychiatrist and he would prefer for her not to decrease this medicine any further.  -she finds benefit from the amitriptyline and asks if this can be increased -this has been a problem for her as long as she can remember. -she asks if it would be ok to take up to 3 trazodone at night  -has ruminating thoughts at night  8) Constipation: -she has been taking colace  Prior history:  She does not eat anything until lunch time. She eats a lot of strawberries and lunch. She feels she needs to lay off the bread. Her daughter bakes pizza and then it is hard for her to resist this. She eats  a lot of broccoli ad stir fired vegetables. She drinks lemonade. She went to the dentist and was advised not to eat tea. If she eats a big lunch she does not eat much supper.   She takes probiotics and omeprazole.   Prior history:  Kendra Simmons is a 71 year-old woman who presents today for follow-up of her lower back pain and bilateral hip pain. Her pain is usually well controlled but she often has spasms during the day and before she sleeps at night. She has been taking Tizanidine 4mg  HS and this has been helping her. She has Tramadol prescribed but has been taking this sparingly and does not feel much relief from it. Once when she had additional spasms during  the day she took the Tizanidine and felt very sleepy afterward She has done therapy in the past and does not want to again at this time, though she knows she needs to exercise more. She used to walk in her church, but it is closed to walking now due to the pandemic.  Since last visit I performed bilateral greater trochancteric bursa injections and her pain has decreased from 6 to 4. Her Lyrica dose has been decreased by Dr. Adele Schilder, her psychiatrist, to avoid polypharmacy, and she feels this change has increased her pain and insomnia.    During today's visit she discusses extensively about her family stressors and how they are impacting her quality of life. She has regular appointments with Dr. Adele Schilder as well that she finds very helpful.   Pain Inventory Average Pain 9 Pain Right Now 4 My pain is dull and aching  In the last 24 hours, has pain interfered with the following? General activity 9 Relation with others 9 Enjoyment of life 9 What TIME of day is your pain at its worst?  morning and night Sleep (in general) Good  Pain is worse with: bending and some activites Pain improves with: rest and medication Relief from Meds: 4   Family History  Problem Relation Age of Onset   Sleep apnea Father    Alcohol abuse Father    Diabetes Mother    Diabetes Sister    Diabetes Maternal Uncle    Diabetes Cousin    Social History   Socioeconomic History   Marital status: Divorced    Spouse name: Not on file   Number of children: 3   Years of education: 14   Highest education level: Not on file  Occupational History   Occupation: Retired  Tobacco Use   Smoking status: Never   Smokeless tobacco: Never  Scientific laboratory technician Use: Never used  Substance and Sexual Activity   Alcohol use: No    Alcohol/week: 0.0 standard drinks    Comment: zero   Drug use: No    Types: Barbituates, Benzodiazepines    Comment: Denies any drug use other than benzos   Sexual activity: Never    Birth  control/protection: Abstinence  Other Topics Concern   Not on file  Social History Narrative   Patient lives at home alone.   Caffeine Use:  2 cokes daily   Sister lives next door.   Social Determinants of Health   Financial Resource Strain: Not on file  Food Insecurity: Not on file  Transportation Needs: Not on file  Physical Activity: Not on file  Stress: Not on file  Social Connections: Not on file   Past Surgical History:  Procedure Laterality Date   BOTOX INJECTION N/A 10/17/2021  Procedure: BOTOX INJECTION;  Surgeon: Clarene Essex, MD;  Location: [facility];  Service: Endoscopy;  Laterality: N/A;   CESAREAN SECTION     COSMETIC SURGERY     ESOPHAGEAL MANOMETRY N/A 09/26/2016   Procedure: ESOPHAGEAL MANOMETRY (EM);  Surgeon: Clarene Essex, MD;  Location: [facility];  Service: Endoscopy;  Laterality: N/A;   ESOPHAGOGASTRODUODENOSCOPY (EGD) WITH PROPOFOL N/A 10/17/2021   Procedure: ESOPHAGOGASTRODUODENOSCOPY (EGD) WITH PROPOFOL;  Surgeon: Clarene Essex, MD;  Location: [facility];  Service: Endoscopy;  Laterality: N/A;   laproscopy     Past Medical History:  Diagnosis Date   Anxiety    Arthritis    Depression    Emotional depression 02/04/2015   GERD (gastroesophageal reflux disease)    High cholesterol    Insomnia    Memory loss    There were no vitals taken for this visit.  Opioid Risk Score:   Fall Risk Score:  `1  Depression screen PHQ 2/9  Depression screen [facility] 2/9 08/24/2021 07/04/2021 11/30/2020 03/09/2020  Decreased Interest 0 3 3 0  Down, Depressed, Hopeless 0 3 3 0  PHQ - 2 Score 0 6 6 0  Some recent data might be hidden    Review of Systems  Musculoskeletal:  Positive for myalgias and neck pain.       Shoulder pain Scapula pain   All other systems reviewed and are negative.     Objective:   Physical Exam Not performed as patient was seen via phone.   Assessment & Plan:   Kendra Simmons is a 71 year-old woman who presents today for follow-up  of her lower back pain and bilateral greater trochanteric bursa pain syndrome.  1) Bilateral greater trochanteric pain syndrome: - Provided referral for PT to focus on stretching and strengthening of the hip abductors, myofascial release, modalities, HEP  -Discontinue steroid injections given waning improvement and risks.   -Currently worse in the right- discussed benefits of pregnancy pillow  -Received two greater trochanteric bursa injections on 2/26 and 3/03 with excellent results. By now thise results have worn off. She continues to take the [facility]- she usually takes only when she has to sweep and mop. She uses voltaren gel. She does not have to use this very often.   -Continue Robaxin. Can take up to 2 per day. Has enough Tizanidine- can take up to 3 per day- LFTs reviewed and stable, new labs recently obtained. Increased dose of Lyrica to 75mg  for pain and insomnia. Use proper bed.   -Discussed that it best to avoid opioids, even tramadol, for chronic pain due to the risk of tolerance and dependence.    2) Insomnia: -She has been using the Tizanidine more often prescribed to help her sleep- advised to try to use no more than three times per day.  -Increased Lyrica to 100mg  for pain, and this will also help with sleep.  -She plans to purchase the pregnancy  -Repeat LFTs on w/ labs with PCP.  -Increase amitriptyline to 150mg  per patient's request.   -Try to go outside near sunrise -Get exercise during the day.  -Discussed good sleep hygiene: turning off all devices an hour before bedtime.  -Chamomile tea with dinner.  -Can consider over the counter melatonin     3) Prediabetes:  -Patient states she was recently diagnosed with diabetes Type 2. I have personally reviewed her labs and provided dietary and exercise advice. She has lost 13 lbs since our visit 3 months ago! Discussed her current diet.  -Continue  Trulicity to help curb her appetite.   4) Obesity: -Educated  regarding health benefits of weight loss- for pain, general health, chronic disease prevention, immune health, mental health.  -188 lbs -Will monitor weight every visit.  -Consider Roobois tea daily.  -Discussed foods that can assist in weight loss: 1) leafy greens- high in fiber and nutrients 2) dark chocolate- improves metabolism (if prefer sweetened, best to sweeten with honey instead of sugar).  3) cruciferous vegetables- high in fiber and protein 4) full fat yogurt: high in healthy fat, protein, calcium, and probiotics 5) apples- high in a variety of phytochemicals 6) nuts- high in fiber and protein that increase feelings of fullness 7) grapefruit: rich in nutrients, antioxidants, and fiber (not to be taken with anticoagulation) 8) beans- high in protein and fiber 9) salmon- has high quality protein and healthy fats 10) green tea- rich in polyphenols 11) eggs- rich in choline and vitamin D 12) tuna- high protein, boosts metabolism 13) avocado- decreases visceral abdominal fat 14) chicken (pasture raised): high in protein and iron 15) blueberries- reduce abdominal fat and cholesterol 16) whole grains- decreases calories retained during digestion, speeds metabolism 17) chia seeds- curb appetite 18) chilies- increases fat metabolism  -Discussed supplements that can be used:  1) Metatrim 400mg  BID 30 minutes before breakfast and dinner  2) Sphaeranthus indicus and Garcinia mangostana (combinations of these and #1 can be found in capsicum and zychrome  3) green coffee bean extract 400mg  twice per day or Irvingia (african mango) 150 to 300mg  twice per day.   5) Osteopenia: Vitamin D supplement, calcium supplement. Weight-bearing exercise. Continue Cubie to exercise while watching television.   6) B12 deficiency: Continue supplement.   7) General health:  --Recommended use of Down Dog app to do 15 min of daily yoga with her daughter. Advised that this will help with both hip and low  back pain.  Recommended eating pain relieving foods and provided with a handout.  Recommended Roobois tea for its multiple benefits.    --Discussed family stressors extensively. Her anxiety and depressed mood given her family is a large component of increased inflammation and pain. Advised that she focus on the positive relationships in her life and the things that she enjoys to reduce her stress and grief. Discussed her recent stressor regarding a cat her daughter brought in that is stressing her other cats.   -She has thought a lot about getting back to work. 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 Encouraged her to do this! I think that is a wonderful idea!  8) Cervical myofascial pain syndrome: -Continue heat -She would like to try trigger point injections in future.   9) Anxiety: Seroquel appears to have stopped helping. Wean to 300mg  Discussed benefits of meditation Increase amitriptyline to 50mg .  -Discussed exercise and meditation as tools to decrease anxiety. -Recommended Down Dog Yoga app -Discussed spending time outdoors. -Discussed positive re-framing of anxiety.  -Discussed the following foods that have been show to reduce anxiety: 1) [place] nuts, mushrooms, soy beans due to their high selenium content. Upper limit of toxicity of selenium is 41mcg/day so no more than 3-4 [place] nuts per day.  2) Fatty fish such as salmon, mackerel, sardines, trout, and herring- high in omega-3 fatty acids 3) Eggs- increases serotonin and dopamine 4) Pumpkin seeds- high in omega-3 fatty acids 5) dark chocolate- high in flavanols that increase blood flow to brain 6) turmeric- take with black pepper to increase  absorption 7) chamomile tea- antioxidant and anti-inflammatory properties 8) yogurt without sugar- supports gut-brain axis 9) green tea- contains L- theanine 10) blueberries- high in vitamin C and antioxidants 11) [place]- high in tryptophan which gets  converted to serotonin 12) bell peppers- rich in vitamin C and antioxidants 13) citrus fruits- rich in vitamin C and antioxidants 14) almonds- high in vitamin E and healthy fats 15) chia seeds- high in omega-3 fatty acids   10) Constipation:  -Provided list of following foods that help with constipation and highlighted a few: 1) prunes- contain high amounts of fiber.  2) apples- has a form of dietary fiber called pectin that accelerates stool movement and increases beneficial gut bacteria 3) pears- in addition to fiber, also high in fructose and sorbitol which have laxative effect 4) figs- contain an enzyme ficin which helps to speed colonic transit 5) kiwis- contain an enzyme actinidin that improves gut motility and reduces constipation 6) oranges- rich in pectin (like apples) 7) grapefruits- contain a flavanol naringenin which has a laxative effect 8) vegetables- rich in fiber and also great sources of folate, vitamin C, and K 9) artichoke- high in inulin, prebiotic great for the microbiome 10) chicory- increases stool frequency and softness (can be added to coffee) 11) rhubarb- laxative effect 12) sweet potato- high fiber 13) beans, peas, and lentils- contain both soluble and insoluble fiber 14) chia seeds- improves intestinal health and gut flora 15) flaxseeds- laxative effect 16) whole grain rye bread- high in fiber 17) oat bran- high in soluble and insoluble fiber 18) kefir- softens stools -recommended to try at least one of these foods every day.  -drink 6-8 glasses of water per day -walk regularly, especially after meals.   11) Depression -discussed considering making her friend her power of attorney given her fears that her daughter does not have her best interest and may place her in a nursing home.  -encouraged her to spend time with her close friend and others who are a positive influence in her life  17 minutes spent in discussion of her pain, use of Robaxin,  Tizanidine, Amitriptyline, Lyrica, and Seroquel, providing needed refills, discussing that she can take 2 Robaxin per day, 3 Tizanidine per day, reviewing her October labs with her which look overall great and that liver enzymes were stable on tizanidine, discussed that more recent labs do not appear to have resulted yet.

## 2022-01-23 ENCOUNTER — Telehealth: Payer: Self-pay | Admitting: *Deleted

## 2022-01-23 ENCOUNTER — Other Ambulatory Visit: Payer: Self-pay

## 2022-01-23 ENCOUNTER — Encounter: Payer: 59 | Attending: Physical Medicine and Rehabilitation | Admitting: Physical Medicine and Rehabilitation

## 2022-01-23 DIAGNOSIS — W19XXXS Unspecified fall, sequela: Secondary | ICD-10-CM

## 2022-01-23 DIAGNOSIS — F330 Major depressive disorder, recurrent, mild: Secondary | ICD-10-CM

## 2022-01-23 NOTE — Telephone Encounter (Signed)
Kendra Simmons called and wanted Dr Ranell Patrick to know that she had a bad fall yesterday in [place]. She fell to her knees and EMS had to come and help get her up. She said it was very scary and it is her 4th fall this month. She has MRI scheduled for 01/31/22. She would like Dr Ranell Patrick to call her if possible. [phone]

## 2022-01-23 NOTE — Progress Notes (Signed)
Subjective:    Patient ID: Kendra Simmons, female    DOB: [birth year], 71 y.o.   MRN: [id number]  HPI  An audio/video tele-health visit is felt to be the most appropriate encounter for this patient at this time. This is a follow up tele-visit via phone. The patient is at home. MD is at office. Prior to scheduling this appointment, our staff discussed the limitations of evaluation and management by telemedicine and the availability of in-person appointments. The patient expressed understanding and agreed to proceed.   Kendra Simmons is a 71 year old woman who presents for follow-up of falls, anxiety, pain, depression, trochanteric bursitis, and insomnia.   1) Bilateral greater trochanteric pain syndrome -only received 1 week of benefit from steroid injections -She has been taking Robaxin- sometimes none, sometimes up 2 two pills per day and she asks if this is ok -She has been using voltaren gel -She has ordered a pillow to help offload her lateral hips- she will get this 3/10 -pain continues to be quite severe and interferes with her sleep.  -pain has been really bad -she asks if it is ok to take up to 3 Tizanidine at night.    2) Cervical myofascial pain syndrome: -muscles feel very tight near her scapula and upper back -She has been using heating pad every day.  -She notes poor posture.  -She has had injections in her upper back before.   3) Obesity: -She got down to 180 and climbed back up again to 192.  -BMI is 32.96 -She has been considering lap band surgery. She is not sure if she would be approved for this.  -She plans to walk more in the summer.  3) Prediabetes  4) HLD  5) Depression:  -She takes Wellbutrin.  -she has been following with a psychiatrist -her daughter often does not talk to her and when she does she can be very hurtful to her -she fears that one day her daughter may put her in a nursing home -she does have a really close friend of 71 years whom  she is seeing the show Cats with, she was very happy on Christmas when her friend gave her these tickers -her son used to be very loving but has been more involved with his wife and children and has not seen her for 7 years. He is her power of attorney.   6) Anxiety: she has been ruminating a lot.  -this prevents her from sleeping at night  7) Insomnia:  -She has been taking Seroquel and it seems to have stopped helping, but she has discussed with her psychiatrist and he would prefer for her not to decrease this medicine any further.  -she finds benefit from the amitriptyline and asks if this can be increased -this has been a problem for her as long as she can remember. -she asks if it would be ok to take up to 3 trazodone at night  -has ruminating thoughts at night  8) Constipation: -she has been taking colace  9) Fall -she had fall while visiting her daughter and had to go to the emergency room -experienced no orthopedic injuries fortunately. -felt give way weakness -asked if the increased dose of amitriptyline could do this. She hopes not as it has helped her to sleep better.   Prior history:  She does not eat anything until lunch time. She eats a lot of strawberries and lunch. She feels she needs to lay off the bread. Her daughter bakes  pizza and then it is hard for her to resist this. She eats a lot of broccoli ad stir fired vegetables. She drinks lemonade. She went to the dentist and was advised not to eat tea. If she eats a big lunch she does not eat much supper.   She takes probiotics and omeprazole.   Prior history:  Kendra Simmons is a 71 year-old woman who presents today for follow-up of her lower back pain and bilateral hip pain. Her pain is usually well controlled but she often has spasms during the day and before she sleeps at night. She has been taking Tizanidine 4mg  HS and this has been helping her. She has Tramadol prescribed but has been taking this sparingly and does  not feel much relief from it. Once when she had additional spasms during the day she took the Tizanidine and felt very sleepy afterward She has done therapy in the past and does not want to again at this time, though she knows she needs to exercise more. She used to walk in her church, but it is closed to walking now due to the pandemic.  Since last visit I performed bilateral greater trochancteric bursa injections and her pain has decreased from 6 to 4. Her Lyrica dose has been decreased by Dr. Adele Schilder, her psychiatrist, to avoid polypharmacy, and she feels this change has increased her pain and insomnia.    During today's visit she discusses extensively about her family stressors and how they are impacting her quality of life. She has regular appointments with Dr. Adele Schilder as well that she finds very helpful.   Pain Inventory Average Pain 9 Pain Right Now 4 My pain is dull and aching  In the last 24 hours, has pain interfered with the following? General activity 9 Relation with others 9 Enjoyment of life 9 What TIME of day is your pain at its worst?  morning and night Sleep (in general) Good  Pain is worse with: bending and some activites Pain improves with: rest and medication Relief from Meds: 4   Family History  Problem Relation Age of Onset   Sleep apnea Father    Alcohol abuse Father    Diabetes Mother    Diabetes Sister    Diabetes Maternal Uncle    Diabetes Cousin    Social History   Socioeconomic History   Marital status: Divorced    Spouse name: Not on file   Number of children: 3   Years of education: 14   Highest education level: Not on file  Occupational History   Occupation: Retired  Tobacco Use   Smoking status: Never   Smokeless tobacco: Never  Scientific laboratory technician Use: Never used  Substance and Sexual Activity   Alcohol use: No    Alcohol/week: 0.0 standard drinks    Comment: zero   Drug use: No    Types: Barbituates, Benzodiazepines    Comment:  Denies any drug use other than benzos   Sexual activity: Never    Birth control/protection: Abstinence  Other Topics Concern   Not on file  Social History Narrative   Patient lives at home alone.   Caffeine Use:  2 cokes daily   Sister lives next door.   Social Determinants of Health   Financial Resource Strain: Not on file  Food Insecurity: Not on file  Transportation Needs: Not on file  Physical Activity: Not on file  Stress: Not on file  Social Connections: Not on file   Past  Surgical History:  Procedure Laterality Date   BOTOX INJECTION N/A 10/17/2021   Procedure: BOTOX INJECTION;  Surgeon: Clarene Essex, MD;  Location: [facility];  Service: Endoscopy;  Laterality: N/A;   CESAREAN SECTION     COSMETIC SURGERY     ESOPHAGEAL MANOMETRY N/A 09/26/2016   Procedure: ESOPHAGEAL MANOMETRY (EM);  Surgeon: Clarene Essex, MD;  Location: [facility];  Service: Endoscopy;  Laterality: N/A;   ESOPHAGOGASTRODUODENOSCOPY (EGD) WITH PROPOFOL N/A 10/17/2021   Procedure: ESOPHAGOGASTRODUODENOSCOPY (EGD) WITH PROPOFOL;  Surgeon: Clarene Essex, MD;  Location: [facility];  Service: Endoscopy;  Laterality: N/A;   laproscopy     Past Medical History:  Diagnosis Date   Anxiety    Arthritis    Depression    Emotional depression 02/04/2015   GERD (gastroesophageal reflux disease)    High cholesterol    Insomnia    Memory loss    There were no vitals taken for this visit.  Opioid Risk Score:   Fall Risk Score:  `1  Depression screen PHQ 2/9  Depression screen [facility] 2/9 08/24/2021 07/04/2021 11/30/2020 03/09/2020  Decreased Interest 0 3 3 0  Down, Depressed, Hopeless 0 3 3 0  PHQ - 2 Score 0 6 6 0  Some recent data might be hidden    Review of Systems  Musculoskeletal:  Positive for myalgias and neck pain.       Shoulder pain Scapula pain   All other systems reviewed and are negative.     Objective:   Physical Exam Not performed as patient was seen via phone.   Assessment & Plan:    Kendra Simmons is a 71 year-old woman who presents today for follow-up of her lower back pain and bilateral greater trochanteric bursa pain syndrome.  1) Bilateral greater trochanteric pain syndrome: - Provided referral for PT to focus on stretching and strengthening of the hip abductors, myofascial release, modalities, HEP  -Discontinue steroid injections given waning improvement and risks.   -Currently worse in the right- discussed benefits of pregnancy pillow  -Received two greater trochanteric bursa injections on 2/26 and 3/03 with excellent results. By now thise results have worn off. She continues to take the [facility]- she usually takes only when she has to sweep and mop. She uses voltaren gel. She does not have to use this very often.   -Continue Robaxin. Can take up to 2 per day. Has enough Tizanidine- can take up to 3 per day- LFTs reviewed and stable, new labs recently obtained. Increased dose of Lyrica to 75mg  for pain and insomnia. Use proper bed.   -Discussed that it best to avoid opioids, even tramadol, for chronic pain due to the risk of tolerance and dependence.    2) Insomnia: -She has been using the Tizanidine more often prescribed to help her sleep- advised to try to use no more than three times per day.  -Increased Lyrica to 100mg  for pain, and this will also help with sleep.  -She plans to purchase the pregnancy  -Repeat LFTs on w/ labs with PCP.  -Increase amitriptyline to 150mg  per patient's request.   -Try to go outside near sunrise -Get exercise during the day.  -Discussed good sleep hygiene: turning off all devices an hour before bedtime.  -Chamomile tea with dinner.  -Can consider over the counter melatonin     3) Prediabetes:  -Patient states she was recently diagnosed with diabetes Type 2. I have personally reviewed her labs and provided dietary and exercise advice. She has lost  13 lbs since our visit 3 months ago! Discussed her current diet.   -Continue Trulicity to help curb her appetite.   4) Obesity: -Educated regarding health benefits of weight loss- for pain, general health, chronic disease prevention, immune health, mental health.  -188 lbs -Will monitor weight every visit.  -Consider Ro[place]ois tea daily.  -Discussed foods that can assist in weight loss: 1) leafy greens- high in fiber and nutrients 2) dark chocolate- improves metabolism (if prefer sweetened, best to sweeten with honey instead of sugar).  3) cruciferous vegetables- high in fiber and protein 4) full fat yogurt: high in healthy fat, protein, calcium, and probiotics 5) apples- high in a variety of phytochemicals 6) nuts- high in fiber and protein that increase feelings of fullness 7) grapefruit: rich in nutrients, antioxidants, and fiber (not to be taken with anticoagulation) 8) beans- high in protein and fiber 9) salmon- has high quality protein and healthy fats 10) green tea- rich in polyphenols 11) eggs- rich in choline and vitamin D 12) tuna- high protein, boosts metabolism 13) avocado- decreases visceral abdominal fat 14) chicken (pasture raised): high in protein and iron 15) blueberries- reduce abdominal fat and cholesterol 16) whole grains- decreases calories retained during digestion, speeds metabolism 17) chia seeds- curb appetite 18) chilies- increases fat metabolism  -Discussed supplements that can be used:  1) Metatrim 400mg  BID 30 minutes before breakfast and dinner  2) Sphaeranthus indicus and Garcinia mangostana (combinations of these and #1 can be found in capsicum and zychrome  3) green coffee bean extract 400mg  twice per day or Irvingia (african mango) 150 to 300mg  twice per day.   5) Osteopenia: Vitamin D supplement, calcium supplement. Weight-bearing exercise. Continue Cubie to exercise while watching television.   6) B12 deficiency: Continue supplement.   7) General health:  --Recommended use of Down Dog app to do 15 min of daily  yoga with her daughter. Advised that this will help with both hip and low back pain.  Recommended eating pain relieving foods and provided with a handout.  Recommended Roobois tea for its multiple benefits.    --Discussed family stressors extensively. Her anxiety and depressed mood given her family is a large component of increased inflammation and pain. Advised that she focus on the positive relationships in her life and the things that she enjoys to reduce her stress and grief. Discussed her recent stressor regarding a cat her daughter brought in that is stressing her other cats.   -She has thought a lot about getting back to work. 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 Encouraged her to do this! I think that is a wonderful idea!  8) Cervical myofascial pain syndrome: -Continue heat -She would like to try trigger point injections in future.   9) Anxiety: Seroquel appears to have stopped helping. Wean to 300mg  Discussed benefits of meditation Increase amitriptyline to 50mg .  -Discussed exercise and meditation as tools to decrease anxiety. -Recommended Down Dog Yoga app -Discussed spending time outdoors. -Discussed positive re-framing of anxiety.  -Discussed the following foods that have been show to reduce anxiety: 1) [place] nuts, mushrooms, soy beans due to their high selenium content. Upper limit of toxicity of selenium is 466mcg/day so no more than 3-4 [place] nuts per day.  2) Fatty fish such as salmon, mackerel, sardines, trout, and herring- high in omega-3 fatty acids 3) Eggs- increases serotonin and dopamine 4) Pumpkin seeds- high in omega-3 fatty acids 5) dark chocolate- high in flavanols  that increase blood flow to brain 6) turmeric- take with black pepper to increase absorption 7) chamomile tea- antioxidant and anti-inflammatory properties 8) yogurt without sugar- supports gut-brain axis 9) green tea- contains L- theanine 10) blueberries- high in  vitamin C and antioxidants 11) [place]- high in tryptophan which gets converted to serotonin 12) bell peppers- rich in vitamin C and antioxidants 13) citrus fruits- rich in vitamin C and antioxidants 14) almonds- high in vitamin E and healthy fats 15) chia seeds- high in omega-3 fatty acids   10) Constipation:  -Provided list of following foods that help with constipation and highlighted a few: 1) prunes- contain high amounts of fiber.  2) apples- has a form of dietary fiber called pectin that accelerates stool movement and increases beneficial gut bacteria 3) pears- in addition to fiber, also high in fructose and sorbitol which have laxative effect 4) figs- contain an enzyme ficin which helps to speed colonic transit 5) kiwis- contain an enzyme actinidin that improves gut motility and reduces constipation 6) oranges- rich in pectin (like apples) 7) grapefruits- contain a flavanol naringenin which has a laxative effect 8) vegetables- rich in fiber and also great sources of folate, vitamin C, and K 9) artichoke- high in inulin, prebiotic great for the microbiome 10) chicory- increases stool frequency and softness (can be added to coffee) 11) rhubarb- laxative effect 12) sweet potato- high fiber 13) beans, peas, and lentils- contain both soluble and insoluble fiber 14) chia seeds- improves intestinal health and gut flora 15) flaxseeds- laxative effect 16) whole grain rye bread- high in fiber 17) oat bran- high in soluble and insoluble fiber 18) kefir- softens stools -recommended to try at least one of these foods every day.  -drink 6-8 glasses of water per day -walk regularly, especially after meals.   11) Depression -discussed considering making her friend her power of attorney given her fears that her daughter does not have her best interest and may place her in a nursing home.  -encouraged her to spend time with her close friend and others who are a positive influence in her  life  47) Fall -discussed that all her amitriptyline, tizanidine, seroquel, lyrica, and robaxin could contribute to confusion and falls, especially the combined effect of all of these -encouraged weaning the medications that seem to be least helpful to her -decided together to decrease tizanidine to 1 pill at a time at night -asked her to let me know if this change improves her symptoms  14 minutes spent in discussing her recent fall, the potential role of the combined sedative effects of several of the medications she is taking that could have contributed to this fall, weaning tizanidine to 1 tab HS

## 2022-01-31 ENCOUNTER — Ambulatory Visit
Admission: RE | Admit: 2022-01-31 | Discharge: 2022-01-31 | Disposition: A | Discharge status: Home / Self Care | Payer: 59 | Source: Ambulatory Visit | Attending: Family Medicine | Admitting: Family Medicine

## 2022-01-31 ENCOUNTER — Other Ambulatory Visit: Payer: Self-pay

## 2022-01-31 DIAGNOSIS — R404 Transient alteration of awareness: Secondary | ICD-10-CM

## 2022-01-31 DIAGNOSIS — R269 Unspecified abnormalities of gait and mobility: Secondary | ICD-10-CM

## 2022-01-31 MED ORDER — GADOBENATE DIMEGLUMINE 529 MG/ML IV SOLN
18.0000 mL | Freq: Once | INTRAVENOUS | Status: AC | PRN
  Administered 2022-01-31: 18 mL via INTRAVENOUS

## 2022-02-06 ENCOUNTER — Telehealth: Payer: Self-pay

## 2022-02-06 NOTE — Telephone Encounter (Signed)
Patient called stating she fell and right side or rib pain, feel bad all over.

## 2022-02-07 ENCOUNTER — Other Ambulatory Visit: Payer: Self-pay

## 2022-02-07 ENCOUNTER — Encounter: Payer: 59 | Admitting: Physical Medicine and Rehabilitation

## 2022-02-08 ENCOUNTER — Telehealth: Payer: Self-pay

## 2022-02-08 NOTE — Telephone Encounter (Signed)
Kendra Simmons called:   Patient stated her cell phone was not working yesterday. So she did not have her telephone visit. You may try to reach her today on home phone number [phone].   Per Dr. Ranell Patrick she will call today at 12 noon. I tried  to call her back on the cell & home phone. No able to leave messages and no phone answered.

## 2022-02-09 ENCOUNTER — Other Ambulatory Visit: Payer: Self-pay

## 2022-02-09 ENCOUNTER — Encounter (HOSPITAL_BASED_OUTPATIENT_CLINIC_OR_DEPARTMENT_OTHER): Payer: 59 | Admitting: Physical Medicine and Rehabilitation

## 2022-02-09 DIAGNOSIS — R682 Dry mouth, unspecified: Secondary | ICD-10-CM | POA: Diagnosis not present

## 2022-02-09 DIAGNOSIS — M7062 Trochanteric bursitis, left hip: Secondary | ICD-10-CM | POA: Diagnosis not present

## 2022-02-09 DIAGNOSIS — M25551 Pain in right hip: Secondary | ICD-10-CM | POA: Diagnosis not present

## 2022-02-09 DIAGNOSIS — W19XXXS Unspecified fall, sequela: Secondary | ICD-10-CM | POA: Diagnosis not present

## 2022-02-09 NOTE — Telephone Encounter (Signed)
Patient would like for Dr. Ranell Patrick to call her.

## 2022-02-10 NOTE — Progress Notes (Signed)
Subjective:    Patient ID: Kendra Simmons, female    DOB: September 16, 1951, 71 y.o.   MRN: [id number]  HPI  An audio/video tele-health visit is felt to be the most appropriate encounter for this patient at this time. This is a follow up tele-visit via phone. The patient is at home. MD is at office. Prior to scheduling this appointment, our staff discussed the limitations of evaluation and management by telemedicine and the availability of in-person appointments. The patient expressed understanding and agreed to proceed.   Kendra Simmons is a 71 year old woman who presents for follow-up of falls, anxiety, pain, depression, trochanteric bursitis, and insomnia.   1) Bilateral greater trochanteric pain syndrome -only received 1 week of benefit from steroid injections -She has been taking Robaxin- sometimes none, sometimes up 2 two pills per day and she asks if this is ok -She has been using voltaren gel -She has ordered a pillow to help offload her lateral hips- she will get this 3/10 -pain continues to be quite severe and interferes with her sleep.  -pain has been really bad -she asks if it is ok to take up to 3 Tizanidine at night.    2) Cervical myofascial pain syndrome: -muscles feel very tight near her scapula and upper back -She has been using heating pad every day.  -She notes poor posture.  -She has had injections in her upper back before.   3) Obesity: -She got down to 180 and climbed back up again to 192.  -BMI is 32.96 -She has been considering lap band surgery. She is not sure if she would be approved for this.  -She plans to walk more in the summer.  3) Prediabetes  4) HLD  5) Depression:  -She takes Wellbutrin.  -she has been following with a psychiatrist -her daughter often does not talk to her and when she does she can be very hurtful to her -she fears that one day her daughter may put her in a nursing home -she does have a really close friend of 67 years whom  she is seeing the show Cats with, she was very happy on Christmas when her friend gave her these tickers -her son used to be very loving but has been more involved with his wife and children and has not seen her for 7 years. He is her power of attorney.   6) Anxiety: she has been ruminating a lot.  -this prevents her from sleeping at night  7) Insomnia:  -She has been taking Seroquel and it seems to have stopped helping, but she has discussed with her psychiatrist and he would prefer for her not to decrease this medicine any further.  -she finds benefit from the amitriptyline and asks if this can be increased -this has been a problem for her as long as she can remember. -she asks if it would be ok to take up to 3 trazodone at night  -has ruminating thoughts at night  8) Constipation: -she has been taking colace  9) Fall -she had fall while visiting her daughter and had to go to the emergency room -experienced no orthopedic injuries fortunately. -felt give way weakness -asked if the increased dose of amitriptyline could do this. She hopes not as it has helped her to sleep better.  -MRI brain showed no significant abnormalities  10) Cognitive deficits -she is scheduled for SLP She has decreased tizanidine to 1 tablet per night  11) Dry mouth -quite severe at times.  Prior history:  She does not eat anything until lunch time. She eats a lot of strawberries and lunch. She feels she needs to lay off the bread. Her daughter bakes pizza and then it is hard for her to resist this. She eats a lot of broccoli ad stir fired vegetables. She drinks lemonade. She went to the dentist and was advised not to eat tea. If she eats a big lunch she does not eat much supper.   She takes probiotics and omeprazole.   Prior history:  Kendra Simmons is a 71 year-old woman who presents today for follow-up of her lower back pain and bilateral hip pain. Her pain is usually well controlled but she often has  spasms during the day and before she sleeps at night. She has been taking Tizanidine 4mg  HS and this has been helping her. She has Tramadol prescribed but has been taking this sparingly and does not feel much relief from it. Once when she had additional spasms during the day she took the Tizanidine and felt very sleepy afterward She has done therapy in the past and does not want to again at this time, though she knows she needs to exercise more. She used to walk in her church, but it is closed to walking now due to the pandemic.  Since last visit I performed bilateral greater trochancteric bursa injections and her pain has decreased from 6 to 4. Her Lyrica dose has been decreased by Dr. Adele Schilder, her psychiatrist, to avoid polypharmacy, and she feels this change has increased her pain and insomnia.    During today's visit she discusses extensively about her family stressors and how they are impacting her quality of life. She has regular appointments with Dr. Adele Schilder as well that she finds very helpful.   Pain Inventory Average Pain 9 Pain Right Now 4 My pain is dull and aching  In the last 24 hours, has pain interfered with the following? General activity 9 Relation with others 9 Enjoyment of life 9 What TIME of day is your pain at its worst?  morning and night Sleep (in general) Good  Pain is worse with: bending and some activites Pain improves with: rest and medication Relief from Meds: 4   Family History  Problem Relation Age of Onset   Sleep apnea Father    Alcohol abuse Father    Diabetes Mother    Diabetes Sister    Diabetes Maternal Uncle    Diabetes Cousin    Social History   Socioeconomic History   Marital status: Divorced    Spouse name: Not on file   Number of children: 3   Years of education: 14   Highest education level: Not on file  Occupational History   Occupation: Retired  Tobacco Use   Smoking status: Never   Smokeless tobacco: Never  Scientific laboratory technician  Use: Never used  Substance and Sexual Activity   Alcohol use: No    Alcohol/week: 0.0 standard drinks    Comment: zero   Drug use: No    Types: Barbituates, Benzodiazepines    Comment: Denies any drug use other than benzos   Sexual activity: Never    Birth control/protection: Abstinence  Other Topics Concern   Not on file  Social History Narrative   Patient lives at home alone.   Caffeine Use:  2 cokes daily   Sister lives next door.   Social Determinants of Health   Financial Resource Strain: Not on file  Food Insecurity: Not on file  Transportation Needs: Not on file  Physical Activity: Not on file  Stress: Not on file  Social Connections: Not on file   Past Surgical History:  Procedure Laterality Date   BOTOX INJECTION N/A 10/17/2021   Procedure: BOTOX INJECTION;  Surgeon: Clarene Essex, MD;  Location: [facility];  Service: Endoscopy;  Laterality: N/A;   CESAREAN SECTION     COSMETIC SURGERY     ESOPHAGEAL MANOMETRY N/A 09/26/2016   Procedure: ESOPHAGEAL MANOMETRY (EM);  Surgeon: Clarene Essex, MD;  Location: [facility];  Service: Endoscopy;  Laterality: N/A;   ESOPHAGOGASTRODUODENOSCOPY (EGD) WITH PROPOFOL N/A 10/17/2021   Procedure: ESOPHAGOGASTRODUODENOSCOPY (EGD) WITH PROPOFOL;  Surgeon: Clarene Essex, MD;  Location: [facility];  Service: Endoscopy;  Laterality: N/A;   laproscopy     Past Medical History:  Diagnosis Date   Anxiety    Arthritis    Depression    Emotional depression 02/04/2015   GERD (gastroesophageal reflux disease)    High cholesterol    Insomnia    Memory loss    There were no vitals taken for this visit.  Opioid Risk Score:   Fall Risk Score:  `1  Depression screen PHQ 2/9  Depression screen [facility] 2/9 08/24/2021 07/04/2021 11/30/2020 03/09/2020  Decreased Interest 0 3 3 0  Down, Depressed, Hopeless 0 3 3 0  PHQ - 2 Score 0 6 6 0  Some recent data might be hidden    Review of Systems  Musculoskeletal:  Positive for myalgias and neck  pain.       Shoulder pain Scapula pain   All other systems reviewed and are negative.     Objective:   Physical Exam Not performed as patient was seen via phone.   Assessment & Plan:   Mrs. Westcott is a 71 year-old woman who presents today for follow-up of her lower back pain and bilateral greater trochanteric bursa pain syndrome.  1) Bilateral greater trochanteric pain syndrome: - Provided referral for PT to focus on stretching and strengthening of the hip abductors, myofascial release, modalities, HEP  -Discontinue steroid injections given waning improvement and risks.   -Currently worse in the right- discussed benefits of pregnancy pillow  -Received two greater trochanteric bursa injections on 2/26 and 3/03 with excellent results. By now thise results have worn off. She continues to take the [facility]- she usually takes only when she has to sweep and mop. She uses voltaren gel. She does not have to use this very often.   -Continue Robaxin. Can take up to 2 per day. Has enough Tizanidine- can take up to 3 per day- LFTs reviewed and stable, new labs recently obtained. Increased dose of Lyrica to 75mg  for pain and insomnia. Use proper bed.   Continue Lyrica.   -Discussed that it best to avoid opioids, even tramadol, for chronic pain due to the risk of tolerance and dependence.    2) Insomnia: -She has been using the Tizanidine more often prescribed to help her sleep- advised to try to use no more than three times per day.  -Increased Lyrica to 100mg  for pain, and this will also help with sleep.  -She plans to purchase the pregnancy  -Repeat LFTs on w/ labs with PCP.  -Decrease amitriptyline to 125mg  due to its side effects.  -Try to go outside near sunrise -Get exercise during the day.  -Discussed good sleep hygiene: turning off all devices an hour before bedtime.  -Chamomile tea with dinner.  -Can consider  over the counter melatonin     3) Prediabetes:  -Patient  states she was recently diagnosed with diabetes Type 2. I have personally reviewed her labs and provided dietary and exercise advice. She has lost 13 lbs since our visit 3 months ago! Discussed her current diet.  -Continue Trulicity to help curb her appetite.   4) Obesity: -Educated regarding health benefits of weight loss- for pain, general health, chronic disease prevention, immune health, mental health.  -188 lbs -Will monitor weight every visit.  -Consider Ro[place]ois tea daily.  -Discussed foods that can assist in weight loss: 1) leafy greens- high in fiber and nutrients 2) dark chocolate- improves metabolism (if prefer sweetened, best to sweeten with honey instead of sugar).  3) cruciferous vegetables- high in fiber and protein 4) full fat yogurt: high in healthy fat, protein, calcium, and probiotics 5) apples- high in a variety of phytochemicals 6) nuts- high in fiber and protein that increase feelings of fullness 7) grapefruit: rich in nutrients, antioxidants, and fiber (not to be taken with anticoagulation) 8) beans- high in protein and fiber 9) salmon- has high quality protein and healthy fats 10) green tea- rich in polyphenols 11) eggs- rich in choline and vitamin D 12) tuna- high protein, boosts metabolism 13) avocado- decreases visceral abdominal fat 14) chicken (pasture raised): high in protein and iron 15) blueberries- reduce abdominal fat and cholesterol 16) whole grains- decreases calories retained during digestion, speeds metabolism 17) chia seeds- curb appetite 18) chilies- increases fat metabolism  -Discussed supplements that can be used:  1) Metatrim 400mg  BID 30 minutes before breakfast and dinner  2) Sphaeranthus indicus and Garcinia mangostana (combinations of these and #1 can be found in capsicum and zychrome  3) green coffee bean extract 400mg  twice per day or Irvingia (african mango) 150 to 300mg  twice per day.   5) Osteopenia: Vitamin D supplement, calcium  supplement. Weight-bearing exercise. Continue Cubie to exercise while watching television.   6) B12 deficiency: Continue supplement.   7) General health:  --Recommended use of Down Dog app to do 15 min of daily yoga with her daughter. Advised that this will help with both hip and low back pain.  Recommended eating pain relieving foods and provided with a handout.  Recommended Roobois tea for its multiple benefits.    --Discussed family stressors extensively. Her anxiety and depressed mood given her family is a large component of increased inflammation and pain. Advised that she focus on the positive relationships in her life and the things that she enjoys to reduce her stress and grief. Discussed her recent stressor regarding a cat her daughter brought in that is stressing her other cats.   -She has thought a lot about getting back to work. 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 Encouraged her to do this! I think that is a wonderful idea!  8) Cervical myofascial pain syndrome: -Continue heat -She would like to try trigger point injections in future.   9) Anxiety: Seroquel appears to have stopped helping. Wean to 300mg  Discussed benefits of meditation Increase amitriptyline to 50mg .  -Discussed exercise and meditation as tools to decrease anxiety. -Recommended Down Dog Yoga app -Discussed spending time outdoors. -Discussed positive re-framing of anxiety.  -Discussed the following foods that have been show to reduce anxiety: 1) [place] nuts, mushrooms, soy beans due to their high selenium content. Upper limit of toxicity of selenium is 414mcg/day so no more than 3-4 [place] nuts per day.  2) Fatty fish such as salmon, mackerel, sardines, trout, and herring- high in omega-3 fatty acids 3) Eggs- increases serotonin and dopamine 4) Pumpkin seeds- high in omega-3 fatty acids 5) dark chocolate- high in flavanols that increase blood flow to brain 6) turmeric- take  with black pepper to increase absorption 7) chamomile tea- antioxidant and anti-inflammatory properties 8) yogurt without sugar- supports gut-brain axis 9) green tea- contains L- theanine 10) blueberries- high in vitamin C and antioxidants 11) [place]- high in tryptophan which gets converted to serotonin 12) bell peppers- rich in vitamin C and antioxidants 13) citrus fruits- rich in vitamin C and antioxidants 14) almonds- high in vitamin E and healthy fats 15) chia seeds- high in omega-3 fatty acids   10) Constipation:  -Provided list of following foods that help with constipation and highlighted a few: 1) prunes- contain high amounts of fiber.  2) apples- has a form of dietary fiber called pectin that accelerates stool movement and increases beneficial gut bacteria 3) pears- in addition to fiber, also high in fructose and sorbitol which have laxative effect 4) figs- contain an enzyme ficin which helps to speed colonic transit 5) kiwis- contain an enzyme actinidin that improves gut motility and reduces constipation 6) oranges- rich in pectin (like apples) 7) grapefruits- contain a flavanol naringenin which has a laxative effect 8) vegetables- rich in fiber and also great sources of folate, vitamin C, and K 9) artichoke- high in inulin, prebiotic great for the microbiome 10) chicory- increases stool frequency and softness (can be added to coffee) 11) rhubarb- laxative effect 12) sweet potato- high fiber 13) beans, peas, and lentils- contain both soluble and insoluble fiber 14) chia seeds- improves intestinal health and gut flora 15) flaxseeds- laxative effect 16) whole grain rye bread- high in fiber 17) oat bran- high in soluble and insoluble fiber 18) kefir- softens stools -recommended to try at least one of these foods every day.  -drink 6-8 glasses of water per day -walk regularly, especially after meals.   11) Depression -discussed considering making her friend her power of  attorney given her fears that her daughter does not have her best interest and may place her in a nursing home.  -encouraged her to spend time with her close friend and others who are a positive influence in her life  38) Fall -discussed that all her amitriptyline, tizanidine, seroquel, lyrica, and robaxin could contribute to confusion and falls, especially the combined effect of all of these. Commended on reducing tizanidine to 1 tablet per night. Decrease amitriptyline to 125mg  -encouraged weaning the medications that seem to be least helpful to her -decided together to decrease tizanidine to 1 pill at a time at night -asked her to let me know if this change improves her symptoms  13) Dry mouth: -decrease amitriptyline to 125mg  HS  14 minutes spent in discussing her pain, falls, cognitive deficits, commended on decreasing tizanidine to 1 tab per night, recommended to decrease amitriptyline to 125mg  HS, referred to physical therapy.

## 2022-02-15 ENCOUNTER — Encounter: Payer: 59 | Attending: Physical Medicine and Rehabilitation | Admitting: Physical Medicine and Rehabilitation

## 2022-02-15 ENCOUNTER — Telehealth: Payer: Self-pay

## 2022-02-15 ENCOUNTER — Other Ambulatory Visit: Payer: Self-pay

## 2022-02-15 ENCOUNTER — Encounter: Payer: Self-pay | Admitting: Physical Medicine and Rehabilitation

## 2022-02-15 VITALS — BP 135/80 | HR 85 | Ht 64.0 in | Wt 193.0 lb

## 2022-02-15 DIAGNOSIS — M7062 Trochanteric bursitis, left hip: Secondary | ICD-10-CM | POA: Diagnosis present

## 2022-02-15 DIAGNOSIS — M25551 Pain in right hip: Secondary | ICD-10-CM | POA: Insufficient documentation

## 2022-02-15 DIAGNOSIS — R682 Dry mouth, unspecified: Secondary | ICD-10-CM | POA: Insufficient documentation

## 2022-02-15 DIAGNOSIS — R4189 Other symptoms and signs involving cognitive functions and awareness: Secondary | ICD-10-CM | POA: Diagnosis present

## 2022-02-15 DIAGNOSIS — G4701 Insomnia due to medical condition: Secondary | ICD-10-CM | POA: Insufficient documentation

## 2022-02-15 DIAGNOSIS — E785 Hyperlipidemia, unspecified: Secondary | ICD-10-CM | POA: Insufficient documentation

## 2022-02-15 MED ORDER — MELOXICAM 7.5 MG PO TABS: 7.5000 mg | ORAL_TABLET | Freq: Two times a day (BID) | ORAL | 3 refills | Status: DC | PRN

## 2022-02-15 MED ORDER — ROSUVASTATIN CALCIUM 10 MG PO TABS: 10.0000 mg | ORAL_TABLET | Freq: Every day | ORAL | 3 refills | Status: DC

## 2022-02-15 MED ORDER — LIDOCAINE 5 % EX PTCH: 1.0000 | MEDICATED_PATCH | CUTANEOUS | 0 refills | Status: DC

## 2022-02-15 MED ORDER — PREGABALIN 100 MG PO CAPS: 100.0000 mg | ORAL_CAPSULE | Freq: Every day | ORAL | 2 refills | Status: DC

## 2022-02-15 NOTE — Progress Notes (Addendum)
Subjective:    Patient ID: Kendra Simmons, female    DOB: 08-01-1951, 71 y.o.   MRN: [id number]  HPI  Kendra Simmons is a 71 year old woman who presents for follow-up of falls, anxiety, pain, depression, trochanteric bursitis, and insomnia.   1) Bilateral greater trochanteric pain syndrome -only received 1 week of benefit from steroid injections -She has been taking Robaxin- sometimes none, sometimes up 2 two pills per day and she asks if this is ok -She has been using voltaren gel -She has ordered a pillow to help offload her lateral hips- she will get this 3/10 -pain continues to be quite severe and interferes with her sleep.  -pain has been really bad -she asks if it is ok to take up to 3 Tizanidine at night.  -pain has been really bad   2) Cervical myofascial pain syndrome: -muscles feel very tight near her scapula and upper back -She has been using heating pad every day.  -She notes poor posture.  -She has had injections in her upper back before.   3) Obesity: -She got down to 180 and climbed back up again to 192.  -BMI is 32.96 -She has been considering lap band surgery. She is not sure if she would be approved for this.  -She plans to walk more in the summer.  3) Prediabetes  4) HLD  5) Depression:  -She takes Wellbutrin.  -she has been following with a psychiatrist -her daughter often does not talk to her and when she does she can be very hurtful to her -she fears that one day her daughter may put her in a nursing home -she does have a really close friend of 35 years whom she is seeing the show Cats with, she was very happy on Christmas when her friend gave her these tickers -her son used to be very loving but has been more involved with his wife and children and has not seen her for 7 years. He is her power of attorney.   6) Anxiety: she has been ruminating a lot.  -this prevents her from sleeping at night  7) Insomnia:  -She has been taking Seroquel  and it seems to have stopped helping, but she has discussed with her psychiatrist and he would prefer for her not to decrease this medicine any further.  -she finds benefit from the amitriptyline and asks if this can be increased -this has been a problem for her as long as she can remember. -she asks if it would be ok to take up to 3 trazodone at night  -has ruminating thoughts at night  8) Constipation: -she has been taking colace  9) Fall -she had fall while visiting her daughter and had to go to the emergency room -experienced no orthopedic injuries fortunately. -felt give way weakness -asked if the increased dose of amitriptyline could do this. She hopes not as it has helped her to sleep better.  -MRI brain showed no significant abnormalities -had a high adjustable bed but it is messed up and she wants one lower.  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  12) Speech is getting worse -some days are worse than others  Prior history:  She does not eat anything until lunch time. She eats a lot of strawberries and lunch. She feels she  needs to lay off the bread. Her daughter bakes pizza and then it is hard for her to resist this. She eats a lot of broccoli ad stir fired vegetables. She drinks lemonade. She went to the dentist and was advised not to eat tea. If she eats a big lunch she does not eat much supper.   She takes probiotics and omeprazole.   Prior history:  Kendra Simmons is a 71 year-old woman who presents today for follow-up of her lower back pain and bilateral hip pain. Her pain is usually well controlled but she often has spasms during the day and before she sleeps at night. She has been taking Tizanidine 4mg  HS and this has been helping her. She has Tramadol prescribed but has been taking this sparingly and does  not feel much relief from it. Once when she had additional spasms during the day she took the Tizanidine and felt very sleepy afterward She has done therapy in the past and does not want to again at this time, though she knows she needs to exercise more. She used to walk in her church, but it is closed to walking now due to the pandemic.  Since last visit I performed bilateral greater trochancteric bursa injections and her pain has decreased from 6 to 4. Her Lyrica dose has been decreased by Dr. Adele Schilder, her psychiatrist, to avoid polypharmacy, and she feels this change has increased her pain and insomnia.    During today's visit she discusses extensively about her family stressors and how they are impacting her quality of life. She has regular appointments with Dr. Adele Schilder as well that she finds very helpful.   Pain Inventory Average Pain 8 Pain Right Now 9 My pain is dull, stabbing, and aching  In the last 24 hours, has pain interfered with the following? General activity 5 Relation with others 6 Enjoyment of life 5 What TIME of day is your pain at its worst? morning Sleep (in general) Fair  Pain is worse with: bending, standing, and some activites Pain improves with: rest and medication Relief from Meds: 5   Family History  Problem Relation Age of Onset   Sleep apnea Father    Alcohol abuse Father    Diabetes Mother    Diabetes Sister    Diabetes Maternal Uncle    Diabetes Cousin    Social History   Socioeconomic History   Marital status: Divorced    Spouse name: Not on file   Number of children: 3   Years of education: 14   Highest education level: Not on file  Occupational History   Occupation: Retired  Tobacco Use   Smoking status: Never   Smokeless tobacco: Never  Scientific laboratory technician Use: Never used  Substance and Sexual Activity   Alcohol use: No    Alcohol/week: 0.0 standard drinks    Comment: zero   Drug use: No    Types: Barbituates, Benzodiazepines     Comment: Denies any drug use other than benzos   Sexual activity: Never    Birth control/protection: Abstinence  Other Topics Concern   Not on file  Social History Narrative   Patient lives at home alone.   Caffeine Use:  2 cokes daily   Sister lives next door.   Social Determinants of Health   Financial Resource Strain: Not on file  Food Insecurity: Not on file  Transportation Needs: Not on file  Physical Activity: Not on file  Stress: Not on file  Social Connections: Not on file   Past Surgical History:  Procedure Laterality Date   BOTOX INJECTION N/A 10/17/2021   Procedure: BOTOX INJECTION;  Surgeon: Clarene Essex, MD;  Location: [facility];  Service: Endoscopy;  Laterality: N/A;   CESAREAN SECTION     COSMETIC SURGERY     ESOPHAGEAL MANOMETRY N/A 09/26/2016   Procedure: ESOPHAGEAL MANOMETRY (EM);  Surgeon: Clarene Essex, MD;  Location: [facility];  Service: Endoscopy;  Laterality: N/A;   ESOPHAGOGASTRODUODENOSCOPY (EGD) WITH PROPOFOL N/A 10/17/2021   Procedure: ESOPHAGOGASTRODUODENOSCOPY (EGD) WITH PROPOFOL;  Surgeon: Clarene Essex, MD;  Location: [facility];  Service: Endoscopy;  Laterality: N/A;   laproscopy     Past Medical History:  Diagnosis Date   Anxiety    Arthritis    Depression    Emotional depression 02/04/2015   GERD (gastroesophageal reflux disease)    High cholesterol    Insomnia    Memory loss    BP 135/80    Pulse 85    Ht 5\' 4"  (1.626 m)    Wt 193 lb (87.5 kg)    SpO2 98%    BMI 33.13 kg/m   Opioid Risk Score:   Fall Risk Score:  `1  Depression screen PHQ 2/9  Depression screen [facility] 2/9 08/24/2021 07/04/2021 11/30/2020 03/09/2020  Decreased Interest 0 3 3 0  Down, Depressed, Hopeless 0 3 3 0  PHQ - 2 Score 0 6 6 0  Some recent data might be hidden    Review of Systems  Musculoskeletal:  Positive for myalgias and neck pain.       Shoulder pain Scapula pain   All other systems reviewed and are negative.     Objective:   Physical Exam Gen:  no distress, normal appearing, BMI 33.13, weight 193 lbs HEENT: oral mucosa pink and moist, NCAT Cardio: Reg rate Chest: normal effort, normal rate of breathing Abd: soft, non-distended Ext: no edema Psych: pleasant, normal affect Skin: intact Neuro: good speech and medical history provided well.   Assessment & Plan:   Mrs. Killion is a 71 year-old woman who presents today for follow-up of her lower back pain and bilateral greater trochanteric bursa pain syndrome.  1) Bilateral greater trochanteric pain syndrome: - Provided referral for PT to focus on stretching and strengthening of the hip abductors, myofascial release, modalities, HEP  -Discontinue steroid injections given waning improvement and risks.   -Currently worse in the right- discussed benefits of pregnancy pillow  -Received two greater trochanteric bursa injections on 2/26 and 3/03 with excellent results. By now thise results have worn off. She continues to take the [facility]- she usually takes only when she has to sweep and mop. She uses voltaren gel. She does not have to use this very often.   -Continue Robaxin. Can take up to 2 per day. Has enough Tizanidine- can take up to 3 per day- LFTs reviewed and stable, new labs recently obtained. Increased dose of Lyrica to 75mg  for pain and insomnia. Use proper bed.   Continue Lyrica. Provided refill 3/2.   -Discussed that it best to avoid opioids, even tramadol, for chronic pain due to the risk of tolerance and dependence.    2) Insomnia: -She has been using the Tizanidine more often prescribed to help her sleep- advised to try to use no more than three times per day.  -no benefits with trazodone.  -Increased Lyrica to 100mg  for pain, and this will also help with sleep.  -She plans to purchase the pregnancy  -  Repeat LFTs on w/ labs with PCP.  -Decrease amitriptyline to 125mg  due to its side effects.  -Try to go outside near sunrise -Get exercise during the day.   -Discussed good sleep hygiene: turning off all devices an hour before bedtime.  -Chamomile tea with dinner.  -Can consider over the counter melatonin -continue amitriptyline 125mg .     3) Prediabetes:  -Patient states she was recently diagnosed with diabetes Type 2. I have personally reviewed her labs and provided dietary and exercise advice. She has lost 13 lbs since our visit 3 months ago! Discussed her current diet.  -Continue Trulicity to help curb her appetite.   4) Obesity: -Educated regarding health benefits of weight loss- for pain, general health, chronic disease prevention, immune health, mental health.  -188 lbs -Will monitor weight every visit.  -Consider Ro[place]ois tea daily.  -Discussed foods that can assist in weight loss: 1) leafy greens- high in fiber and nutrients 2) dark chocolate- improves metabolism (if prefer sweetened, best to sweeten with honey instead of sugar).  3) cruciferous vegetables- high in fiber and protein 4) full fat yogurt: high in healthy fat, protein, calcium, and probiotics 5) apples- high in a variety of phytochemicals 6) nuts- high in fiber and protein that increase feelings of fullness 7) grapefruit: rich in nutrients, antioxidants, and fiber (not to be taken with anticoagulation) 8) beans- high in protein and fiber 9) salmon- has high quality protein and healthy fats 10) green tea- rich in polyphenols 11) eggs- rich in choline and vitamin D 12) tuna- high protein, boosts metabolism 13) avocado- decreases visceral abdominal fat 14) chicken (pasture raised): high in protein and iron 15) blueberries- reduce abdominal fat and cholesterol 16) whole grains- decreases calories retained during digestion, speeds metabolism 17) chia seeds- curb appetite 18) chilies- increases fat metabolism  -Discussed supplements that can be used:  1) Metatrim 400mg  BID 30 minutes before breakfast and dinner  2) Sphaeranthus indicus and Garcinia mangostana  (combinations of these and #1 can be found in capsicum and zychrome  3) green coffee bean extract 400mg  twice per day or Irvingia (african mango) 150 to 300mg  twice per day.   5) Osteopenia: Vitamin D supplement, calcium supplement. Weight-bearing exercise. Continue Cubie to exercise while watching television.   6) B12 deficiency: Continue supplement.   7) General health:  --Recommended use of Down Dog app to do 15 min of daily yoga with her daughter. Advised that this will help with both hip and low back pain.  Recommended eating pain relieving foods and provided with a handout.  Recommended Roobois tea for its multiple benefits.    --Discussed family stressors extensively. Her anxiety and depressed mood given her family is a large component of increased inflammation and pain. Advised that she focus on the positive relationships in her life and the things that she enjoys to reduce her stress and grief. Discussed her recent stressor regarding a cat her daughter brought in that is stressing her other cats.   -She has thought a lot about getting back to work. 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 Encouraged her to do this! I think that is a wonderful idea!  8) Cervical myofascial pain syndrome: -Continue heat -She would like to try trigger point injections in future.   9) Anxiety: Seroquel appears to have stopped helping. Wean to 300mg  Discussed benefits of meditation Increase amitriptyline to 50mg .  -Discussed exercise and meditation as tools to decrease anxiety. -Recommended Down Dog  Yoga app -Discussed spending time outdoors. -Discussed positive re-framing of anxiety.  -Discussed the following foods that have been show to reduce anxiety: 1) [place] nuts, mushrooms, soy beans due to their high selenium content. Upper limit of toxicity of selenium is 432mcg/day so no more than 3-4 [place] nuts per day.  2) Fatty fish such as salmon, mackerel, sardines, trout,  and herring- high in omega-3 fatty acids 3) Eggs- increases serotonin and dopamine 4) Pumpkin seeds- high in omega-3 fatty acids 5) dark chocolate- high in flavanols that increase blood flow to brain 6) turmeric- take with black pepper to increase absorption 7) chamomile tea- antioxidant and anti-inflammatory properties 8) yogurt without sugar- supports gut-brain axis 9) green tea- contains L- theanine 10) blueberries- high in vitamin C and antioxidants 11) [place]- high in tryptophan which gets converted to serotonin 12) bell peppers- rich in vitamin C and antioxidants 13) citrus fruits- rich in vitamin C and antioxidants 14) almonds- high in vitamin E and healthy fats 15) chia seeds- high in omega-3 fatty acids   10) Constipation:  -Provided list of following foods that help with constipation and highlighted a few: 1) prunes- contain high amounts of fiber.  2) apples- has a form of dietary fiber called pectin that accelerates stool movement and increases beneficial gut bacteria 3) pears- in addition to fiber, also high in fructose and sorbitol which have laxative effect 4) figs- contain an enzyme ficin which helps to speed colonic transit 5) kiwis- contain an enzyme actinidin that improves gut motility and reduces constipation 6) oranges- rich in pectin (like apples) 7) grapefruits- contain a flavanol naringenin which has a laxative effect 8) vegetables- rich in fiber and also great sources of folate, vitamin C, and K 9) artichoke- high in inulin, prebiotic great for the microbiome 10) chicory- increases stool frequency and softness (can be added to coffee) 11) rhubarb- laxative effect 12) sweet potato- high fiber 13) beans, peas, and lentils- contain both soluble and insoluble fiber 14) chia seeds- improves intestinal health and gut flora 15) flaxseeds- laxative effect 16) whole grain rye bread- high in fiber 17) oat bran- high in soluble and insoluble fiber 18) kefir- softens  stools -recommended to try at least one of these foods every day.  -drink 6-8 glasses of water per day -walk regularly, especially after meals.   11) Depression -discussed considering making her friend her power of attorney given her fears that her daughter does not have her best interest and may place her in a nursing home.  -encouraged her to spend time with her close friend and others who are a positive influence in her life  58) Fall -discussed that all her amitriptyline, tizanidine, seroquel, lyrica, and robaxin could contribute to confusion and falls, especially the combined effect of all of these. Commended on reducing tizanidine to 1 tablet per night. Decrease amitriptyline to 125mg  -encouraged weaning the medications that seem to be least helpful to her -decided together to decrease tizanidine to 1 pill at a time at night -asked her to let me know if this change improves her symptoms  13) Dry mouth: -decrease amitriptyline to 125mg  HS. Discussed that this is likely contributor to dry mouth  14) Multiple cavities: -avoid added sugar, especially in drink form -advised neem, vegetables, fruits, nuts.   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  -Provided with list of supplements that can help with dyslipidemia: 1) Vitamin B3 500-4,000mg  in divided doses daily (would recommend starting low as can cause uncomfortable facial  flushing if started at too high a dose) 2) Phytosterols 2.15 grams daily 3) Fermented soy 30-50 grams daily 4) EGCG (found in green tea): 500-1000mg  daily 5) Omega-3 fatty acids 3000-5,000mg  daily 6) Flax seed 40 grams daily 7) Monounsaturated fats 20-40 grams daily (olives, olive oil, nuts), also reduces cardiovascular disease 8) Sesame: 40 grams daily 9) Gamma/delta tocotrienols- a family of unsaturated forms of Vitamin E- 200mg  with dinner 10) Pantethine 900mg  daily in divided doses 11) Resveratrol 250mg  daily 12) N Acetyl Cysteine  2000mg  daily in divided doses 13) Curcumin 2000-5000mg  in divided doses daily 14) Pomegranate juice: 8 ounces daily, also helps to lower blood pressure 15) Pomegranate seeds one cup daily, also helps to lower blood pressure 16) Citrus Bergamot 1000mg  daily, also helps with glucose control and weight loss 17) Vitamin C 500mg  daily 18) Quercetin 500-1000mg  daily 19) Glutathione 20) Probiotics 60-100 billion organisms per day 21) Fiber 22) Oats 23) Aged garlic (can eat as food or supplement of 600-900mg  per day) 24) Chia seeds 25 grams per day 25) Lycopene- carotenoid found in high concentrations in tomatoes. 26) Alpha linolenic acid 27) Flavonoids and anthocyanins 28) Wogonin- flavanoid that enhances reverse cholesterol transport 29) Coenzyme Q10 30) Pantethine- derivative of Vitamin B5: 300mg  three times per day or 450mg  twice per day with or without food 31) Barley and other whole grains 32) Orange juice 33) L- carnitine 34) L- Lysine 35) L- Arginine 36) Almonds 37) Morin 38) Rutin 39) Carnosine 40) Histidine  41) Kaempferol  42) Organosulfur compounds 43) Vitamin E 44) Oleic acid 45) RBO (ferulic acid gammaoryzanol) 46) grape seed extract 47) Red wine 48) Berberine HCL 500mg  daily or twice per day- more effective and with fewer adverse effects that ezetimibe monotherapy 49) red yeast rice 2400- 4800 mg/day 50) chlorella 51) Licorice

## 2022-02-15 NOTE — Telephone Encounter (Signed)
PA submitted for Lidocaine ?

## 2022-02-15 NOTE — Addendum Note (Signed)
Addended by: Izora Ribas on: 02/15/2022 10:27 AM ? ? Modules accepted: Orders ? ?

## 2022-02-15 NOTE — Patient Instructions (Addendum)
Neem ? ?Provided with list of supplements that can help with dyslipidemia: ?1) Vitamin B3 500-4,000mg  in divided doses daily (would recommend starting low as can cause uncomfortable facial flushing if started at too high a dose) ?2) Phytosterols 2.15 grams daily ?3) Fermented soy 30-50 grams daily ?4) EGCG (found in green tea): 500-1000mg  daily ?5) Omega-3 fatty acids 3000-5,000mg  daily ?6) Flax seed 40 grams daily ?7) Monounsaturated fats 20-40 grams daily (olives, olive oil, nuts), also reduces cardiovascular disease ?8) Sesame: 40 grams daily ?9) Gamma/delta tocotrienols- a family of unsaturated forms of Vitamin E- 200mg  with dinner ?10) Pantethine 900mg  daily in divided doses ?11) Resveratrol 250mg  daily ?12) N Acetyl Cysteine 2000mg  daily in divided doses ?13) Curcumin 2000-5000mg  in divided doses daily ?14) Pomegranate juice: 8 ounces daily, also helps to lower blood pressure ?15) Pomegranate seeds one cup daily, also helps to lower blood pressure ?16) Citrus Bergamot 1000mg  daily, also helps with glucose control and weight loss ?17) Vitamin C 500mg  daily ?18) Quercetin 500-1000mg  daily ?19) Glutathione ?20) Probiotics 60-100 billion organisms per day ?21) Fiber ?22) Oats ?23) Aged garlic (can eat as food or supplement of 600-900mg  per day) ?24) Chia seeds 25 grams per day ?25) Lycopene- carotenoid found in high concentrations in tomatoes. ?26) Alpha linolenic acid ?27) Flavonoids and anthocyanins ?28) Wogonin- flavanoid that enhances reverse cholesterol transport ?29) Coenzyme Q10 ?30) Pantethine- derivative of Vitamin B5: 300mg  three times per day or 450mg  twice per day with or without food ?31) Barley and other whole grains ?32) Orange juice ?33) L- carnitine ?34) L- Lysine ?35) L- Arginine ?36) Almonds ?30) Morin ?38) Rutin ?39) Carnosine ?40) Histidine  ?41) Kaempferol  ?42) Organosulfur compounds ?43) Vitamin E ?44) Oleic acid ?45) RBO (ferulic acid gammaoryzanol) ?46) grape seed extract ?47) Red  wine ?48) Berberine HCL 500mg  daily or twice per day- more effective and with fewer adverse effects that ezetimibe monotherapy ?49) red yeast rice 2400- 4800 mg/day ?50) chlorella ?51) Licorice  ?

## 2022-02-15 NOTE — Addendum Note (Signed)
Addended by: Izora Ribas on: 02/15/2022 10:26 AM ? ? Modules accepted: Orders ? ?

## 2022-02-16 NOTE — Telephone Encounter (Signed)
Request Reference Number: [id number]. LIDOCAINE PAD 5% is approved through 12/16/2022. Your patient may now fill this prescription and it will be covered. ?

## 2022-02-20 ENCOUNTER — Other Ambulatory Visit: Payer: Self-pay | Admitting: Physical Medicine and Rehabilitation

## 2022-02-23 ENCOUNTER — Other Ambulatory Visit: Payer: Self-pay | Admitting: Orthopaedic Surgery

## 2022-02-28 ENCOUNTER — Encounter: Payer: Self-pay | Admitting: Speech Pathology

## 2022-02-28 ENCOUNTER — Encounter: Payer: Self-pay | Admitting: *Deleted

## 2022-03-06 ENCOUNTER — Ambulatory Visit: Payer: 59 | Admitting: Diagnostic Neuroimaging

## 2022-03-07 ENCOUNTER — Encounter: Payer: Self-pay | Admitting: Diagnostic Neuroimaging

## 2022-03-14 ENCOUNTER — Telehealth: Payer: Self-pay

## 2022-03-14 NOTE — Telephone Encounter (Signed)
Per Caryl Pina at [facility] ? ?She has not been setup up on CPAP.  ? ?She was called on 12/08 Called pt. to schedule her for her CPAP pt. stated that she is wanting to wait on getting it she has started a medication and its help greatly with sleep. Pt. would like a f/u to call in 3 months on. ? ?She was called again on 03/13   Called and pt. stated she would like a call back in a few months for and update. Tasked pt. order out till May 22 for update. order will need new RX 05/22/22. ?

## 2022-03-19 ENCOUNTER — Ambulatory Visit: Payer: 59 | Admitting: Speech Pathology

## 2022-03-23 ENCOUNTER — Other Ambulatory Visit (HOSPITAL_COMMUNITY): Payer: Self-pay | Admitting: Psychiatry

## 2022-03-23 DIAGNOSIS — F330 Major depressive disorder, recurrent, mild: Secondary | ICD-10-CM

## 2022-03-23 DIAGNOSIS — F411 Generalized anxiety disorder: Secondary | ICD-10-CM

## 2022-03-26 ENCOUNTER — Telehealth (HOSPITAL_BASED_OUTPATIENT_CLINIC_OR_DEPARTMENT_OTHER): Payer: 59 | Admitting: Psychiatry

## 2022-03-26 ENCOUNTER — Encounter (HOSPITAL_COMMUNITY): Payer: Self-pay | Admitting: Psychiatry

## 2022-03-26 VITALS — Wt 193.0 lb

## 2022-03-26 DIAGNOSIS — F4321 Adjustment disorder with depressed mood: Secondary | ICD-10-CM

## 2022-03-26 DIAGNOSIS — F330 Major depressive disorder, recurrent, mild: Secondary | ICD-10-CM

## 2022-03-26 DIAGNOSIS — F411 Generalized anxiety disorder: Secondary | ICD-10-CM | POA: Diagnosis not present

## 2022-03-26 MED ORDER — BUPROPION HCL ER (XL) 150 MG PO TB24: ORAL_TABLET | ORAL | 0 refills | Status: DC

## 2022-03-26 MED ORDER — CLONAZEPAM 0.5 MG PO TABS: 0.5000 mg | ORAL_TABLET | Freq: Every day | ORAL | 0 refills | Status: DC

## 2022-03-26 NOTE — Progress Notes (Signed)
Virtual Visit via Telephone Note ? ?I connected with Kendra Simmons on 03/26/22 at  3:40 PM EDT by telephone and verified that I am speaking with the correct person using two identifiers. ? ?Location: ?Patient: Home ?Provider: Home Office ?  ?I discussed the limitations, risks, security and privacy concerns of performing an evaluation and management service by telephone and the availability of in person appointments. I also discussed with the patient that there may be a patient responsible charge related to this service. The patient expressed understanding and agreed to proceed. ? ? ?History of Present Illness: ?Patient is evaluated by phone session.  Patient told 3 weeks ago her oldest sister died and she was very upset because she found the news through [place].  Though she was able to attend the funeral but she is not happy with the rest of the family member who did not bother to inform her.  Patient told even in the funeral services she was treated very bad by other family members.  Patient told after 6 hours her brother-in-law died.  She is not sure what was the reason for brother-in-law death but she was told that sister died due to [place].  Patient also very sad on her daughter because when she told her daughter about her sister's death, her daughter did not respond very well and she feels it was a cold response.  Patient realized in the past had a very difficult relationship with her family members related to the property.  She has no contact with the rest of the family but she was hoping at least news of the death should be informed.  She admitted angry on her family members but denies any homicidal thoughts.  She also complaining of dry mouth and I noticed recently amitriptyline increased from '100mg'$  to 125 mg.  She is also taking Seroquel 300 mg at bedtime.  She tried to cut it down but she could not sleep.  We have talked about polypharmacy as patient taking Lyrica, muscle relaxant, multiple  psychotropic medication.  Overall she feels her depression is a stable.  She is more active and go outside.  Her weight is stable.  She has no tremor or shakes or any EPS.  She is getting Seroquel and amitriptyline from her PCP.  We are providing Wellbutrin and Klonopin.  She denies any hallucination, paranoia or any suicidal thoughts.  She has not been in any therapy. ? ? ?Past Psychiatric History:  ?H/O multiple inpatient and IOP. Last inpatient June 2016. H/O hallucination, paranoia and depression. Saw [name], [name] and Dr. Thurmond Butts.  Tried Xanax, amitriptyline, imipramine, temazepam, trazodone, Zyprexa, Luvox, Wellbutrin, Prozac, Vistaril, Remeron and Bellsomra.    ? ?Psychiatric Specialty Exam: ?Physical Exam  ?Review of Systems  ?Constitutional:   ?     Dry mouth   ?Weight 193 lb (87.5 kg).There is no height or weight on file to calculate BMI.  ?General Appearance: NA  ?Eye Contact:  NA  ?Speech:  Slow  ?Volume:  Decreased  ?Mood:  Dysphoric  ?Affect:  NA  ?Thought Process:  Goal Directed  ?Orientation:  Full (Time, Place, and Person)  ?Thought Content:  Rumination  ?Suicidal Thoughts:  No  ?Homicidal Thoughts:  No  ?Memory:  Immediate;   Good ?Recent;   Good ?Remote;   Fair  ?Judgement:  Intact  ?Insight:  Present  ?Psychomotor Activity:  NA  ?Concentration:  Concentration: Fair and Attention Span: Fair  ?Recall:  Fair  ?Fund of Knowledge:  Good  ?Language:  Good  ?Akathisia:  No  ?Handed:  Right  ?AIMS (if indicated):     ?Assets:  Communication Skills ?Desire for Improvement ?Housing  ?ADL's:  Intact  ?Cognition:  WNL  ?Sleep:   ok  ?  ? ? ? ?Assessment and Plan: ?Major depressive disorder, recurrent.  Generalized anxiety disorder.  Grief ? ?Discussed recent loss of her elderly sister.  Even though she was not in communication with her but still she is upset that other family members did not told her and she received the news through [employer].  I recommend she should consider grief therapy as it has  bring her old memories related to her family members.  She does not want to change the medication at this time.  I recommend she should reduce the amitriptyline back to 100 prescribed by PCP as it is causing dry mouth.  Patient reluctant to cut down the Seroquel which is getting from her PCP.  I will continue Klonopin 0.5 mg at bedtime and Wellbutrin XL 150 mg in the morning.  She is also taking Lyrica and muscle relaxant.  Discussed polypharmacy.  Patient lives with her daughter and sometimes she has chronic issues with her but stable.  Recommended to call us back if she is any question or any concern.  Follow-up in 3 months. ? ?Follow Up Instructions: ? ?  ?I discussed the assessment and treatment plan with the patient. The patient was provided an opportunity to ask questions and all were answered. The patient agreed with the plan and demonstrated an understanding of the instructions. ?  ?The patient was advised to call back or seek an in-person evaluation if the symptoms worsen or if the condition fails to improve as anticipated. ? ?Collaboration of Care: Primary Care Provider AEB notes are in epic to review. ? ?Patient/Guardian was advised Release of Information must be obtained prior to any record release in order to collaborate their care with an outside provider. Patient/Guardian was advised if they have not already done so to contact the registration department to sign all necessary forms in order for [place] to release information regarding their care.  ? ?Consent: Patient/Guardian gives verbal consent for treatment and assignment of benefits for services provided during this visit. Patient/Guardian expressed understanding and agreed to proceed.   ? ?I provided 23 minutes of non-face-to-face time during this encounter. ? ? ?[name], MD  ?

## 2022-03-28 ENCOUNTER — Ambulatory Visit: Payer: 59 | Admitting: Neurology

## 2022-04-05 ENCOUNTER — Telehealth: Payer: Self-pay | Admitting: *Deleted

## 2022-04-05 NOTE — Telephone Encounter (Signed)
Kendra Simmons wanted to let Dr Ranell Patrick know that she is doing "a lot" better. She said she woul dknow what she is talking about.  ?

## 2022-04-17 ENCOUNTER — Ambulatory Visit: Payer: 59 | Admitting: Physical Medicine and Rehabilitation

## 2022-04-19 ENCOUNTER — Telehealth: Payer: Self-pay | Admitting: *Deleted

## 2022-04-19 NOTE — Telephone Encounter (Signed)
Kendra Simmons called to let Dr Ranell Patrick know she has been down in her hips and back for the last week and doesn't know what to do.  Please advise. ?

## 2022-04-23 ENCOUNTER — Encounter: Payer: 59 | Attending: Physical Medicine and Rehabilitation | Admitting: Physical Medicine and Rehabilitation

## 2022-04-23 DIAGNOSIS — M7062 Trochanteric bursitis, left hip: Secondary | ICD-10-CM | POA: Insufficient documentation

## 2022-04-23 DIAGNOSIS — M7061 Trochanteric bursitis, right hip: Secondary | ICD-10-CM | POA: Diagnosis not present

## 2022-04-23 DIAGNOSIS — F439 Reaction to severe stress, unspecified: Secondary | ICD-10-CM | POA: Insufficient documentation

## 2022-04-23 DIAGNOSIS — F411 Generalized anxiety disorder: Secondary | ICD-10-CM | POA: Insufficient documentation

## 2022-04-23 DIAGNOSIS — M25551 Pain in right hip: Secondary | ICD-10-CM | POA: Insufficient documentation

## 2022-04-23 NOTE — Progress Notes (Signed)
? ?Subjective:  ? ? Patient ID: Kendra Simmons, female    DOB: 1951-07-20, 71 y.o.   MRN: [id number] ? ?HPI  ?An audio/video tele-health visit is felt to be the most appropriate encounter for this patient at this time. This is a follow up tele-visit via phone. The patient is at home. MD is at office. Prior to scheduling this appointment, our staff discussed the limitations of evaluation and management by telemedicine and the availability of in-person appointments. The patient expressed understanding and agreed to proceed.  ? ?Kendra Simmons is a 71 year old woman who presents for follow-up of falls, anxiety, pain, depression, trochanteric bursitis, and insomnia.  ? ?1) Bilateral greater trochanteric pain syndrome ?-only received 1 week of benefit from steroid injections last time, but she would like to try these again as soon as possible as pain has been very severe ?-she got a new mattress that she thinks is good quality, but has noticed worsening low back and hip pain since using this ?-she pain limites her from cleaning and doing activities around the house ?-she has used tylenol and ibuprofen for relief.  ?-she has not tried [employer] ?-She has been taking Robaxin- sometimes none, sometimes up 2 two pills per day and she asks if this is ok ?-She has been using voltaren gel ?-She has ordered a pillow to help offload her lateral hips- she will get this 3/10 ?-pain continues to be quite severe and interferes with her sleep.  ?-she asks if it is ok to take up to 3 Tizanidine at night.  ? ? ?2) Cervical myofascial pain syndrome: ?-muscles feel very tight near her scapula and upper back ?-She has been using heating pad every day.  ?-She notes poor posture.  ?-She has had injections in her upper back before.  ? ?3) Obesity: ?-She got down to 180 and climbed back up again to 192.  ?-BMI is 32.96 ?-She has been considering lap band surgery. She is not sure if she would be approved for this.  ?-She plans to walk  more in the summer. ? ?3) Prediabetes ? ?4) HLD ? ?5) Depression:  ?-She takes Wellbutrin.  ?-she has been following with a psychiatrist ?-her daughter often does not talk to her and when she does she can be very hurtful to her ?-she fears that one day her daughter may put her in a nursing home ?-she does have a really close friend of 53 years whom she is seeing the show Cats with, she was very happy on Christmas when her friend gave her these tickers ?-her son used to be very loving but has been more involved with his wife and children and has not seen her for 7 years. He is her power of attorney.  ? ?6) Anxiety: she has been ruminating a lot.  ?-this prevents her from sleeping at night ? ?7) Insomnia:  ?-She has been taking Seroquel and it seems to have stopped helping, but she has discussed with her psychiatrist and he would prefer for her not to decrease this medicine any further.  ?-she finds benefit from the amitriptyline and asks if this can be increased ?-this has been a problem for her as long as she can remember. ?-she asks if it would be ok to take up to 3 trazodone at night  ?-has ruminating thoughts at night ? ?8) Constipation: ?-she has been taking colace ? ?9) Fall ?-she had fall while visiting her daughter and had to go to the  emergency room ?-experienced no orthopedic injuries fortunately. ?-felt give way weakness ?-asked if the increased dose of amitriptyline could do this. She hopes not as it has helped her to sleep better.  ?-MRI brain showed no significant abnormalities ?-had a high adjustable bed but it is messed up and she wants one lower. ? 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 ?11) Dry mouth ?-quite severe at times. ? ?12) Speech is getting worse ?-some days are worse than others ? ?Prior history:  ?She does not eat anything until lunch time. She eats a lot of  strawberries and lunch. She feels she needs to lay off the bread. Her daughter bakes pizza and then it is hard for her to resist this. She eats a lot of broccoli ad stir fired vegetables. She drinks lemonade. She went to the dentist and was advised not to eat tea. If she eats a big lunch she does not eat much supper.  ? ?She takes probiotics and omeprazole.  ? ?Prior history:  ?Kendra Simmons is a 71 year-old woman who presents today for follow-up of her lower back pain and bilateral hip pain. Her pain is usually well controlled but she often has spasms during the day and before she sleeps at night. She has been taking Tizanidine '4mg'$  HS and this has been helping her. She has Tramadol prescribed but has been taking this sparingly and does not feel much relief from it. Once when she had additional spasms during the day she took the Tizanidine and felt very sleepy afterward She has done therapy in the past and does not want to again at this time, though she knows she needs to exercise more. She used to walk in her church, but it is closed to walking now due to the pandemic. ? ?Since last visit I performed bilateral greater trochancteric bursa injections and her pain has decreased from 6 to 4. Her Lyrica dose has been decreased by Dr. Adele Schilder, her psychiatrist, to avoid polypharmacy, and she feels this change has increased her pain and insomnia.  ?  ?During today's visit she discusses extensively about her family stressors and how they are impacting her quality of life. She has regular appointments with Dr. Adele Schilder as well that she finds very helpful.  ? ?Pain Inventory ?Average Pain 8 ?Pain Right Now 9 ?My pain is dull, stabbing, and aching ? ?In the last 24 hours, has pain interfered with the following? ?General activity 5 ?Relation with others 6 ?Enjoyment of life 5 ?What TIME of day is your pain at its worst? morning ?Sleep (in general) Fair ? ?Pain is worse with: bending, standing, and some activites ?Pain improves  with: rest and medication ?Relief from Meds: 5 ? ? ?Family History  ?Problem Relation Age of Onset  ? Cancer Mother   ? Diabetes Mother   ? Sleep apnea Father   ? Alcohol abuse Father   ? Diabetes Sister   ? Diabetes Maternal Uncle   ? Diabetes Cousin   ? ?Social History  ? ?Socioeconomic History  ? Marital status: Divorced  ?  Spouse name: Not on file  ? Number of children: 3  ? Years of education: 81  ? Highest education level: Not on file  ?Occupational History  ? Occupation: Retired  ?Tobacco Use  ? Smoking status: Never  ? Smokeless tobacco: Never  ?Vaping Use  ?  Vaping Use: Never used  ?Substance and Sexual Activity  ? Alcohol use: No  ?  Alcohol/week: 0.0 standard drinks  ?  Comment: zero  ? Drug use: No  ?  Types: Barbituates, Benzodiazepines  ?  Comment: Denies any drug use other than benzos  ? Sexual activity: Never  ?  Birth control/protection: Abstinence  ?Other Topics Concern  ? Not on file  ?Social History Narrative  ? Patient lives at home, daughter with her.   ? Caffeine Use:  2 cokes daily  ? Sister lives next door.  ? ?Social Determinants of Health  ? ?Financial Resource Strain: Not on file  ?Food Insecurity: Not on file  ?Transportation Needs: Not on file  ?Physical Activity: Not on file  ?Stress: Not on file  ?Social Connections: Not on file  ? ?Past Surgical History:  ?Procedure Laterality Date  ? BOTOX INJECTION N/A 10/17/2021  ? Procedure: BOTOX INJECTION;  Surgeon: Clarene Essex, MD;  Location: [facility];  Service: Endoscopy;  Laterality: N/A;  ? CESAREAN SECTION    ? COSMETIC SURGERY    ? ESOPHAGEAL MANOMETRY N/A 09/26/2016  ? Procedure: ESOPHAGEAL MANOMETRY (EM);  Surgeon: Clarene Essex, MD;  Location: [facility];  Service: Endoscopy;  Laterality: N/A;  ? ESOPHAGOGASTRODUODENOSCOPY (EGD) WITH PROPOFOL N/A 10/17/2021  ? Procedure: ESOPHAGOGASTRODUODENOSCOPY (EGD) WITH PROPOFOL;  Surgeon: Clarene Essex, MD;  Location: [facility];  Service: Endoscopy;  Laterality: N/A;  ? laproscopy    ? ?Past  Medical History:  ?Diagnosis Date  ? Anxiety   ? Arthritis   ? Depression   ? Emotional depression 02/04/2015  ? Frequent falls   ? GERD (gastroesophageal reflux disease)   ? High cholesterol   ? Insomnia

## 2022-04-24 ENCOUNTER — Encounter: Payer: Self-pay | Admitting: Physical Medicine and Rehabilitation

## 2022-04-24 ENCOUNTER — Encounter (HOSPITAL_BASED_OUTPATIENT_CLINIC_OR_DEPARTMENT_OTHER): Payer: 59 | Admitting: Physical Medicine and Rehabilitation

## 2022-04-24 VITALS — BP 109/76 | HR 85 | Temp 97.9°F | Ht 64.0 in | Wt 194.0 lb

## 2022-04-24 DIAGNOSIS — M25551 Pain in right hip: Secondary | ICD-10-CM

## 2022-04-24 DIAGNOSIS — M7061 Trochanteric bursitis, right hip: Secondary | ICD-10-CM | POA: Diagnosis present

## 2022-04-24 DIAGNOSIS — F439 Reaction to severe stress, unspecified: Secondary | ICD-10-CM | POA: Diagnosis present

## 2022-04-24 DIAGNOSIS — F411 Generalized anxiety disorder: Secondary | ICD-10-CM | POA: Diagnosis present

## 2022-04-24 DIAGNOSIS — M7062 Trochanteric bursitis, left hip: Secondary | ICD-10-CM | POA: Diagnosis present

## 2022-04-24 MED ORDER — AMITRIPTYLINE HCL 150 MG PO TABS: 150.0000 mg | ORAL_TABLET | Freq: Every day | ORAL | 3 refills | Status: DC

## 2022-04-24 NOTE — Progress Notes (Signed)
Trochanteric bursa injection, bilateral ? ?Indication ?Trochanteric bursitis, bilateral. Exam has tenderness over the greater trochanter of the hip. Pain has not responded to conservative care such as exercise therapy and oral medications. Pain interferes with sleep or with mobility ?Informed consent was obtained after describing risks and benefits of the procedure with the patient these include bleeding bruising and infection. Patient has signed written consent form. Patient placed in a lateral decubitus position with the affected hip superior. Point of maximal pain was palpated marked and prepped with Betadine and entered with a needle to bone contact. Needle slightly withdrawn then '6mg'$  of betamethasone with 4 cc 1% lidocaine were injected. Patient tolerated procedure well. Post procedure instructions given.  ?

## 2022-04-30 ENCOUNTER — Encounter (HOSPITAL_BASED_OUTPATIENT_CLINIC_OR_DEPARTMENT_OTHER): Payer: 59 | Admitting: Physical Medicine and Rehabilitation

## 2022-04-30 DIAGNOSIS — M7061 Trochanteric bursitis, right hip: Secondary | ICD-10-CM | POA: Diagnosis not present

## 2022-04-30 DIAGNOSIS — F439 Reaction to severe stress, unspecified: Secondary | ICD-10-CM

## 2022-04-30 DIAGNOSIS — M7062 Trochanteric bursitis, left hip: Secondary | ICD-10-CM

## 2022-04-30 NOTE — Progress Notes (Signed)
? ?Subjective:  ? ? Patient ID: Kendra Simmons, female    DOB: 05/12/1951, 71 y.o.   MRN: [id number] ? ?HPI  ?An audio/video tele-health visit is felt to be the most appropriate encounter for this patient at this time. This is a follow up tele-visit via phone. The patient is at home. MD is at office. Prior to scheduling this appointment, our staff discussed the limitations of evaluation and management by telemedicine and the availability of in-person appointments. The patient expressed understanding and agreed to proceed.  ? ?Kendra Simmons is a 71 year old woman who presents for follow-up of falls, anxiety, pain, depression, trochanteric bursitis, and insomnia.  ? ?1) Bilateral greater trochanteric pain syndrome ?Only received 3 days of benefit from steroid injection and now pain has returned ?-she continues to use supportive pillows at night ?-she got a new mattress that she thinks is good quality, but has noticed worsening low back and hip pain since using this ?-she pain limites her from cleaning and doing activities around the house ?-she has used tylenol and ibuprofen for relief.  ?-she has not tried [employer] ?-She has been taking Robaxin- sometimes none, sometimes up 2 two pills per day and she asks if this is ok ?-She has been using voltaren gel ?-She has ordered a pillow to help offload her lateral hips- she will get this 3/10 ?-pain continues to be quite severe and interferes with her sleep.  ?-she asks if it is ok to take up to 3 Tizanidine at night.  ? ? ?2) Cervical myofascial pain syndrome: ?-muscles feel very tight near her scapula and upper back ?-She has been using heating pad every day.  ?-She notes poor posture.  ?-She has had injections in her upper back before.  ? ?3) Obesity: ?-She got down to 180 and climbed back up again to 192.  ?-BMI is 32.96 ?-She has been considering lap band surgery. She is not sure if she would be approved for this.  ?-She plans to walk more in the  summer. ? ?3) Prediabetes ? ?4) HLD ? ?5) Depression:  ?-She takes Wellbutrin.  ?-she has been following with a psychiatrist ?-her daughter often does not talk to her and when she does she can be very hurtful to her ?-she fears that one day her daughter may put her in a nursing home ?-she does have a really close friend of 39 years whom she is seeing the show Cats with, she was very happy on Christmas when her friend gave her these tickers ?-her son used to be very loving but has been more involved with his wife and children and has not seen her for 7 years. He is her power of attorney.  ? ?6) Anxiety: she has been ruminating a lot.  ?-this prevents her from sleeping at night ? ?7) Insomnia:  ?-She has been taking Seroquel and it seems to have stopped helping, but she has discussed with her psychiatrist and he would prefer for her not to decrease this medicine any further.  ?-she finds benefit from the amitriptyline and asks if this can be increased ?-this has been a problem for her as long as she can remember. ?-she asks if it would be ok to take up to 3 trazodone at night  ?-has ruminating thoughts at night ? ?8) Constipation: ?-she has been taking colace ? ?9) Fall ?-she had fall while visiting her daughter and had to go to the emergency room ?-experienced no orthopedic injuries fortunately. ?-  felt give way weakness ?-asked if the increased dose of amitriptyline could do this. She hopes not as it has helped her to sleep better.  ?-MRI brain showed no significant abnormalities ?-had a high adjustable bed but it is messed up and she wants one lower. ? 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 ?11) Dry mouth ?-quite severe at times. ? ?12) Speech is getting worse ?-some days are worse than others ? ?13) Stress from home situation ?-had a good mother's day and her daughter was kinder to  her ?-she was told her mother cat has leukemia ? ?Prior history:  ?She does not eat anything until lunch time. She eats a lot of strawberries and lunch. She feels she needs to lay off the bread. Her daughter bakes pizza and then it is hard for her to resist this. She eats a lot of broccoli ad stir fired vegetables. She drinks lemonade. She went to the dentist and was advised not to eat tea. If she eats a big lunch she does not eat much supper.  ? ?She takes probiotics and omeprazole.  ? ?Prior history:  ?Kendra Simmons is a 71 year-old woman who presents today for follow-up of her lower back pain and bilateral hip pain. Her pain is usually well controlled but she often has spasms during the day and before she sleeps at night. She has been taking Tizanidine '4mg'$  HS and this has been helping her. She has Tramadol prescribed but has been taking this sparingly and does not feel much relief from it. Once when she had additional spasms during the day she took the Tizanidine and felt very sleepy afterward She has done therapy in the past and does not want to again at this time, though she knows she needs to exercise more. She used to walk in her church, but it is closed to walking now due to the pandemic. ? ?Since last visit I performed bilateral greater trochancteric bursa injections and her pain has decreased from 6 to 4. Her Lyrica dose has been decreased by Dr. Adele Schilder, her psychiatrist, to avoid polypharmacy, and she feels this change has increased her pain and insomnia.  ?  ?During today's visit she discusses extensively about her family stressors and how they are impacting her quality of life. She has regular appointments with Dr. Adele Schilder as well that she finds very helpful.  ? ?Pain Inventory ?Average Pain 8 ?Pain Right Now 9 ?My pain is dull, stabbing, and aching ? ?In the last 24 hours, has pain interfered with the following? ?General activity 5 ?Relation with others 6 ?Enjoyment of life 5 ?What TIME of day is your  pain at its worst? morning ?Sleep (in general) Fair ? ?Pain is worse with: bending, standing, and some activites ?Pain improves with: rest and medication ?Relief from Meds: 5 ? ? ?Family History  ?Problem Relation Age of Onset  ? Cancer Mother   ? Diabetes Mother   ? Sleep apnea Father   ? Alcohol abuse Father   ? Diabetes Sister   ? Diabetes Maternal Uncle   ? Diabetes Cousin   ? ?Social History  ? ?Socioeconomic History  ? Marital status: Divorced  ?  Spouse name: Not on file  ? Number of children: 3  ? Years of education: 50  ? Highest education level: Not on file  ?Occupational History  ?  Occupation: Retired  ?Tobacco Use  ? Smoking status: Never  ? Smokeless tobacco: Never  ?Vaping Use  ? Vaping Use: Never used  ?Substance and Sexual Activity  ? Alcohol use: No  ?  Alcohol/week: 0.0 standard drinks  ?  Comment: zero  ? Drug use: No  ?  Types: Barbituates, Benzodiazepines  ?  Comment: Denies any drug use other than benzos  ? Sexual activity: Never  ?  Birth control/protection: Abstinence  ?Other Topics Concern  ? Not on file  ?Social History Narrative  ? Patient lives at home, daughter with her.   ? Caffeine Use:  2 cokes daily  ? Sister lives next door.  ? ?Social Determinants of Health  ? ?Financial Resource Strain: Not on file  ?Food Insecurity: Not on file  ?Transportation Needs: Not on file  ?Physical Activity: Not on file  ?Stress: Not on file  ?Social Connections: Not on file  ? ?Past Surgical History:  ?Procedure Laterality Date  ? BOTOX INJECTION N/A 10/17/2021  ? Procedure: BOTOX INJECTION;  Surgeon: Clarene Essex, MD;  Location: [facility];  Service: Endoscopy;  Laterality: N/A;  ? CESAREAN SECTION    ? COSMETIC SURGERY    ? ESOPHAGEAL MANOMETRY N/A 09/26/2016  ? Procedure: ESOPHAGEAL MANOMETRY (EM);  Surgeon: Clarene Essex, MD;  Location: [facility];  Service: Endoscopy;  Laterality: N/A;  ? ESOPHAGOGASTRODUODENOSCOPY (EGD) WITH PROPOFOL N/A 10/17/2021  ? Procedure: ESOPHAGOGASTRODUODENOSCOPY (EGD) WITH  PROPOFOL;  Surgeon: Clarene Essex, MD;  Location: [facility];  Service: Endoscopy;  Laterality: N/A;  ? laproscopy    ? ?Past Medical History:  ?Diagnosis Date  ? Anxiety   ? Arthritis   ? Depression   ? Emotional de

## 2022-05-04 ENCOUNTER — Other Ambulatory Visit: Payer: Self-pay | Admitting: Physical Medicine and Rehabilitation

## 2022-05-11 ENCOUNTER — Other Ambulatory Visit: Payer: Self-pay

## 2022-05-11 ENCOUNTER — Telehealth (HOSPITAL_COMMUNITY): Payer: Self-pay | Admitting: *Deleted

## 2022-05-11 NOTE — Telephone Encounter (Signed)
She is on a LOT of sedating meds. She can try taking one extra clonazepam a day just for a short time and discuss with Dr. Adele Schilder next week

## 2022-05-11 NOTE — Telephone Encounter (Signed)
I will notify pt. thanks

## 2022-05-11 NOTE — Telephone Encounter (Signed)
Dr. Adele Schilder pt called c/o increased anxiety due to older sister dying last month and family issues. Pt takes Klonopin 0.5 mg qhs but states she doesn't want to use those early by taking an extra one prn. Dr. Adele Schilder has spoken with pt regarding polypharmacy. Pt next appointment is scheduled for 06/25/22. Please review and advise.

## 2022-05-15 ENCOUNTER — Other Ambulatory Visit: Payer: Self-pay | Admitting: Physical Medicine and Rehabilitation

## 2022-05-17 ENCOUNTER — Other Ambulatory Visit: Payer: Self-pay | Admitting: *Deleted

## 2022-05-17 MED ORDER — PREGABALIN 100 MG PO CAPS
100.0000 mg | ORAL_CAPSULE | Freq: Every day | ORAL | 1 refills | Status: DC
Start: 2022-05-17 — End: 2022-10-12

## 2022-05-21 ENCOUNTER — Telehealth (HOSPITAL_COMMUNITY): Payer: Self-pay | Admitting: *Deleted

## 2022-05-21 NOTE — Telephone Encounter (Signed)
Yes some grief counseling or support group. Will call back to advise.

## 2022-05-21 NOTE — Telephone Encounter (Addendum)
Pt called with c/o that she's not doing very well and feels either and increase in dosage of the Klonopin, or frequency would help her so much. She called while Dr. Harrington Challenger was covering and she advised an occasional 1 extra dose during the day prn. Then to be discussed with you on next appointment on 06/25/22.  I did then explain that to pt but she's calling saying that she's just not doing well at all and doesn't know what to do. Please review and advise.

## 2022-05-21 NOTE — Telephone Encounter (Signed)
She is to see a therapist before.  I think she should consider going back to therapy.  Since her sister died we have recommended grief counseling and I am not sure if she started yet or not.  She is already on too many medication.  He did not help then we may consider going up on Wellbutrin.

## 2022-05-22 ENCOUNTER — Telehealth (HOSPITAL_COMMUNITY): Payer: Self-pay | Admitting: *Deleted

## 2022-05-22 NOTE — Telephone Encounter (Signed)
Writer attempted to call pt with response from provider however no answer at both numbers and home VM is full. FYI. Will try again later today.

## 2022-05-23 ENCOUNTER — Telehealth (HOSPITAL_COMMUNITY): Payer: Self-pay | Admitting: *Deleted

## 2022-05-23 NOTE — Telephone Encounter (Signed)
I reviewed her medication.  She is getting psychotropic medication from her physical rehab MD.  Her amitriptyline was recently increased.  She is also taking Lyrica and Seroquel 300 and additional Seroquel 50 mg.  I will suggest wait since amitriptyline was recently increased.  I prefer in the future she should stay with 1 physician for the management of psychotropic medication.  I also encouraged her to look into counseling as patient has family issues that need to be addressed in therapy.  I will not add any more medication as patient taking multiple medication.

## 2022-05-23 NOTE — Telephone Encounter (Signed)
Writer spoke with pt regarding grief counseling advising that [facility] has support groups, counseling, for free. Pt says that she knew about that however since she is not able to increase Klonopin pt is asking if there is anything else she take for anxiety (in addition to Klonopin), like Vistaril. Writer did advise that provider is looking at the overall medication regime, combination and not just the Klonopin. Pt verbalizes understanding. Still would like something as an adjunct. Please review and advise. Pt next appointment is scheduled for 07/05/22.

## 2022-05-28 ENCOUNTER — Telehealth: Payer: Self-pay

## 2022-05-28 NOTE — Telephone Encounter (Signed)
Dr. Ranell Patrick is out of the office today & tomorrow.   Kendra Simmons called to report a fall at home on 05/26/2022. She stated she fell on her face. Patient still has back & groin pain from the fall.   Patient has been advised to call her PCP today to report the issue. Dr. Ranell Patrick is not in the office today or tomorrow.

## 2022-06-01 ENCOUNTER — Telehealth: Payer: Self-pay | Admitting: *Deleted

## 2022-06-01 ENCOUNTER — Encounter: Payer: 59 | Admitting: Physical Medicine and Rehabilitation

## 2022-06-01 NOTE — Telephone Encounter (Signed)
Kendra Simmons called and says that she is hurting all over and the muscle relaxer is not helping.  She would like to speak with Dr Ranell Patrick about this.

## 2022-06-05 ENCOUNTER — Telehealth: Payer: Self-pay

## 2022-06-05 NOTE — Telephone Encounter (Signed)
Patient called and stated her pain is really bad on the right side since her fall. She stated it is going down her right leg and when standing, it feels like she is going to go down. She stated it is worse in the morning. She wants to know what can be done

## 2022-06-06 ENCOUNTER — Encounter: Payer: 59 | Attending: Physical Medicine and Rehabilitation | Admitting: Physical Medicine and Rehabilitation

## 2022-06-06 DIAGNOSIS — M7061 Trochanteric bursitis, right hip: Secondary | ICD-10-CM

## 2022-06-06 DIAGNOSIS — W19XXXS Unspecified fall, sequela: Secondary | ICD-10-CM | POA: Diagnosis not present

## 2022-06-06 DIAGNOSIS — F439 Reaction to severe stress, unspecified: Secondary | ICD-10-CM | POA: Diagnosis not present

## 2022-06-06 DIAGNOSIS — M7062 Trochanteric bursitis, left hip: Secondary | ICD-10-CM

## 2022-06-06 NOTE — Progress Notes (Signed)
Subjective:    Patient ID: Kendra Simmons, female    DOB: July 13, 1951, 71 y.o.   MRN: [id number]  HPI  An audio/video tele-health visit is felt to be the most appropriate encounter for this patient at this time. This is a follow up tele-visit via phone. The patient is at home. MD is at office. Prior to scheduling this appointment, our staff discussed the limitations of evaluation and management by telemedicine and the availability of in-person appointments. The patient expressed understanding and agreed to proceed.   Kendra Simmons is a 71 year old woman who presents for follow-up of falls, anxiety, pain, depression, trochanteric bursitis, and insomnia.   1) Bilateral greater trochanteric pain syndrome Only received 3 days of benefit from steroid injection and now pain has returned -she continues to use supportive pillows at night -she got a new mattress that she thinks is good quality, but has noticed worsening low back and hip pain since using this -she pain limites her from cleaning and doing activities around the house -she has used tylenol and ibuprofen for relief.  -she has not tried [employer] -She has been taking Robaxin- sometimes none, sometimes up 2 two pills per day and she asks if this is ok -She has been using voltaren gel -She has ordered a pillow to help offload her lateral hips- she will get this 3/10 -pain continues to be quite severe and interferes with her sleep.  -she asks if it is ok to take up to 3 Tizanidine at night.   2) Cervical myofascial pain syndrome: -muscles feel very tight near her scapula and upper back -She has been using heating pad every day.  -She notes poor posture.  -She has had injections in her upper back before.   3) Obesity: -She got down to 180 and climbed back up again to 192.  -BMI is 32.96 -She has been considering lap band surgery. She is not sure if she would be approved for this.  -She plans to walk more in the summer.  3)  Prediabetes  4) HLD  5) Depression:  -She takes Wellbutrin.  -she has been following with a psychiatrist -her daughter often does not talk to her and when she does she can be very hurtful to her -she fears that one day her daughter may put her in a nursing home -she does have a really close friend of 80 years whom she is seeing the show Cats with, she was very happy on Christmas when her friend gave her these tickers -her son used to be very loving but has been more involved with his wife and children and has not seen her for 7 years. He is her power of attorney.   6) Anxiety: she has been ruminating a lot.  -this prevents her from sleeping at night  7) Insomnia:  -She has been taking Seroquel and it seems to have stopped helping, but she has discussed with her psychiatrist and he would prefer for her not to decrease this medicine any further.  -she finds benefit from the amitriptyline and asks if this can be increased -this has been a problem for her as long as she can remember. -she asks if it would be ok to take up to 3 trazodone at night  -has ruminating thoughts at night  8) Constipation: -she has been taking colace  9) Fall -she just had a recent fall which she feels is her worst yet -she has been having sciatica since this fall -  she would like to get repeat XR -she does not think fall was related to amitriptyline -she does have meloxicam on hand and has not used this in sometime -she had fall while visiting her daughter and had to go to the emergency room -experienced no orthopedic injuries fortunately. -felt give way weakness -asked if the increased dose of amitriptyline could do this. She hopes not as it has helped her to sleep better.  -MRI brain showed no significant abnormalities -had a high adjustable bed but it is messed up and she wants one lower.  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  12) Speech is getting worse -some days are worse than others  13) Stress from home situation -had a good mother's day and her daughter was kinder to her -she was told her mother cat has leukemia  Prior history:  She does not eat anything until lunch time. She eats a lot of strawberries and lunch. She feels she needs to lay off the bread. Her daughter bakes pizza and then it is hard for her to resist this. She eats a lot of broccoli ad stir fired vegetables. She drinks lemonade. She went to the dentist and was advised not to eat tea. If she eats a big lunch she does not eat much supper.   She takes probiotics and omeprazole.   Prior history:  Kendra Simmons is a 71 year-old woman who presents today for follow-up of her lower back pain and bilateral hip pain. Her pain is usually well controlled but she often has spasms during the day and before she sleeps at night. She has been taking Tizanidine '4mg'$  HS and this has been helping her. She has Tramadol prescribed but has been taking this sparingly and does not feel much relief from it. Once when she had additional spasms during the day she took the Tizanidine and felt very sleepy afterward She has done therapy in the past and does not want to again at this time, though she knows she needs to exercise more. She used to walk in her church, but it is closed to walking now due to the pandemic.  Since last visit I performed bilateral greater trochancteric bursa injections and her pain has decreased from 6 to 4. Her Lyrica dose has been decreased by Dr. Adele Schilder, her psychiatrist, to avoid polypharmacy, and she feels this change has increased her pain and insomnia.    During today's visit she discusses extensively about her family stressors and how they are impacting her quality of life. She has regular appointments with Dr. Adele Schilder as well that she finds very helpful.    Pain Inventory Average Pain 8 Pain Right Now 9 My pain is dull, stabbing, and aching  In the last 24 hours, has pain interfered with the following? General activity 5 Relation with others 6 Enjoyment of life 5 What TIME of day is your pain at its worst? morning Sleep (in general) Fair  Pain is worse with: bending, standing, and some activites Pain improves with: rest and medication Relief from Meds: 5   Family History  Problem Relation Age of Onset   Cancer Mother    Diabetes Mother    Sleep apnea Father    Alcohol abuse Father    Diabetes Sister    Diabetes Maternal Uncle  Diabetes Cousin    Social History   Socioeconomic History   Marital status: Divorced    Spouse name: Not on file   Number of children: 3   Years of education: 14   Highest education level: Not on file  Occupational History   Occupation: Retired  Tobacco Use   Smoking status: Never   Smokeless tobacco: Never  Vaping Use   Vaping Use: Never used  Substance and Sexual Activity   Alcohol use: No    Alcohol/week: 0.0 standard drinks of alcohol    Comment: zero   Drug use: No    Types: Barbituates, Benzodiazepines    Comment: Denies any drug use other than benzos   Sexual activity: Never    Birth control/protection: Abstinence  Other Topics Concern   Not on file  Social History Narrative   Patient lives at home, daughter with her.    Caffeine Use:  2 cokes daily   Sister lives next door.   Social Determinants of Health   Financial Resource Strain: Not on file  Food Insecurity: Not on file  Transportation Needs: Not on file  Physical Activity: Not on file  Stress: Not on file  Social Connections: Not on file   Past Surgical History:  Procedure Laterality Date   BOTOX INJECTION N/A 10/17/2021   Procedure: BOTOX INJECTION;  Surgeon: Clarene Essex, MD;  Location: [facility];  Service: Endoscopy;  Laterality: N/A;   CESAREAN SECTION     COSMETIC SURGERY     ESOPHAGEAL MANOMETRY  N/A 09/26/2016   Procedure: ESOPHAGEAL MANOMETRY (EM);  Surgeon: Clarene Essex, MD;  Location: [facility];  Service: Endoscopy;  Laterality: N/A;   ESOPHAGOGASTRODUODENOSCOPY (EGD) WITH PROPOFOL N/A 10/17/2021   Procedure: ESOPHAGOGASTRODUODENOSCOPY (EGD) WITH PROPOFOL;  Surgeon: Clarene Essex, MD;  Location: [facility];  Service: Endoscopy;  Laterality: N/A;   laproscopy     Past Medical History:  Diagnosis Date   Anxiety    Arthritis    Depression    Emotional depression 02/04/2015   Frequent falls    GERD (gastroesophageal reflux disease)    High cholesterol    Insomnia    Memory loss    Transient alteration of awareness    There were no vitals taken for this visit.  Opioid Risk Score:   Fall Risk Score:  `1  Depression screen [facility] 2/9     04/24/2022   10:08 AM 08/24/2021    9:39 AM 07/04/2021    2:25 PM 11/30/2020   10:19 AM 03/09/2020    3:14 PM 09/12/2015   12:09 PM  Depression screen PHQ 2/9  Decreased Interest 1 0 3 3 0   Down, Depressed, Hopeless 1 0 3 3 0   PHQ - 2 Score 2 0 6 6 0   Altered sleeping        Tired, decreased energy        Change in appetite        Feeling bad or failure about yourself         Trouble concentrating        Moving slowly or fidgety/restless        Suicidal thoughts        PHQ-9 Score        Difficult doing work/chores           Information is confidential and restricted. Go to Review Flowsheets to unlock data.     Review of Systems  Musculoskeletal:  Positive for myalgias and neck pain.  Shoulder pain Scapula pain   All other systems reviewed and are negative.      Objective:   Physical Exam Not performed  Assessment & Plan:   Kendra Simmons is a 71 year-old woman who presents today for follow-up of her lower back pain and bilateral greater trochanteric bursa pain syndrome.  1) Bilateral greater trochanteric pain syndrome: - Provided referral for PT to focus on stretching and strengthening of the hip abductors,  myofascial release, modalities, HEP  -Discontinue steroid injections given waning improvement and risks. Messaged Renae to schedule her tomorrow for bilateral hip steroid injections -recommended applying Tiger Balm, discussed the ingredients in this -continue tizanidine, robaxin, amitriptyline, Lyrica, tylenol, and ibuprofen -recommended applying tiger balm -continue supportive pillows  -Currently worse in the right- discussed benefits of pregnancy pillow  -Received two greater trochanteric bursa injections on 2/26 and 3/03 with excellent results. By now thise results have worn off. She continues to take the [facility]- she usually takes only when she has to sweep and mop. She uses voltaren gel. She does not have to use this very often.   -Continue Robaxin. Can take up to 2 per day. Has enough Tizanidine- can take up to 3 per day- LFTs reviewed and stable, new labs recently obtained. Increased dose of Lyrica to '75mg'$  for pain and insomnia. Use proper bed.   Continue Lyrica. Provided refill 3/2.   -Discussed that it best to avoid opioids, even tramadol, for chronic pain due to the risk of tolerance and dependence.    2) Insomnia: -She has been using the Tizanidine more often prescribed to help her sleep- advised to try to use no more than three times per day.  -no benefits with trazodone.  -Increased Lyrica to '100mg'$  for pain, and this will also help with sleep.  -She plans to purchase the pregnancy  -Repeat LFTs on w/ labs with PCP.  -Decrease amitriptyline to '125mg'$  due to its side effects.  -Try to go outside near sunrise -Get exercise during the day.  -Discussed good sleep hygiene: turning off all devices an hour before bedtime.  -Chamomile tea with dinner.  -Can consider over the counter melatonin -continue amitriptyline '125mg'$ .     3) Prediabetes:  -Patient states she was recently diagnosed with diabetes Type 2. I have personally reviewed her labs and provided dietary and  exercise advice. She has lost 13 lbs since our visit 3 months ago! Discussed her current diet.  -Continue Trulicity to help curb her appetite.   4) Obesity: -Educated regarding health benefits of weight loss- for pain, general health, chronic disease prevention, immune health, mental health.  -188 lbs -Will monitor weight every visit.  -Consider Roobois tea daily.  -Discussed foods that can assist in weight loss: 1) leafy greens- high in fiber and nutrients 2) dark chocolate- improves metabolism (if prefer sweetened, best to sweeten with honey instead of sugar).  3) cruciferous vegetables- high in fiber and protein 4) full fat yogurt: high in healthy fat, protein, calcium, and probiotics 5) apples- high in a variety of phytochemicals 6) nuts- high in fiber and protein that increase feelings of fullness 7) grapefruit: rich in nutrients, antioxidants, and fiber (not to be taken with anticoagulation) 8) beans- high in protein and fiber 9) salmon- has high quality protein and healthy fats 10) green tea- rich in polyphenols 11) eggs- rich in choline and vitamin D 12) tuna- high protein, boosts metabolism 13) avocado- decreases visceral abdominal fat 14) chicken (pasture raised): high in protein and iron  15) blueberries- reduce abdominal fat and cholesterol 16) whole grains- decreases calories retained during digestion, speeds metabolism 17) chia seeds- curb appetite 18) chilies- increases fat metabolism  -Discussed supplements that can be used:  1) Metatrim '400mg'$  BID 30 minutes before breakfast and dinner  2) Sphaeranthus indicus and Garcinia mangostana (combinations of these and #1 can be found in capsicum and zychrome  3) green coffee bean extract '400mg'$  twice per day or Irvingia (african mango) 150 to '300mg'$  twice per day.   5) Osteopenia: Vitamin D supplement, calcium supplement. Weight-bearing exercise. Continue Cubie to exercise while watching television.   6) B12 deficiency:  Continue supplement.   7) General health:  --Recommended use of Down Dog app to do 15 min of daily yoga with her daughter. Advised that this will help with both hip and low back pain.  Recommended eating pain relieving foods and provided with a handout.  Recommended Roobois tea for its multiple benefits.    --Discussed family stressors extensively. Her anxiety and depressed mood given her family is a large component of increased inflammation and pain. Advised that she focus on the positive relationships in her life and the things that she enjoys to reduce her stress and grief. Discussed her recent stressor regarding a cat her daughter brought in that is stressing her other cats.   -She has thought a lot about getting back to work. 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 Encouraged her to do this! I think that is a wonderful idea!  8) Cervical myofascial pain syndrome: -Continue heat -She would like to try trigger point injections in future.  -Messaged her cervical pillow she can try  9) Anxiety:  -Discussed her increased anxiety lately due to a cat family she found and is caring for. Discussed I would look out for anyone interested in adopting a cat.  -discussed stressors, positives in her life Seroquel appears to have stopped helping. Wean to '300mg'$  Discussed benefits of meditation Increase amitriptyline to '50mg'$ .  -Discussed exercise and meditation as tools to decrease anxiety. -Recommended Down Dog Yoga app -Discussed spending time outdoors. -Discussed positive re-framing of anxiety.  -Discussed the following foods that have been show to reduce anxiety: 1) [place] nuts, mushrooms, soy beans due to their high selenium content. Upper limit of toxicity of selenium is 441mg/day so no more than 3-4 bBolivianuts per day.  2) Fatty fish such as salmon, mackerel, sardines, trout, and herring- high in omega-3 fatty acids 3) Eggs- increases serotonin and dopamine 4)  Pumpkin seeds- high in omega-3 fatty acids 5) dark chocolate- high in flavanols that increase blood flow to brain 6) turmeric- take with black pepper to increase absorption 7) chamomile tea- antioxidant and anti-inflammatory properties 8) yogurt without sugar- supports gut-brain axis 9) green tea- contains L- theanine 10) blueberries- high in vitamin C and antioxidants 11) t[place] high in tryptophan which gets converted to serotonin 12) bell peppers- rich in vitamin C and antioxidants 13) citrus fruits- rich in vitamin C and antioxidants 14) almonds- high in vitamin E and healthy fats 15) chia seeds- high in omega-3 fatty acids   10) Constipation:  -Provided list of following foods that help with constipation and highlighted a few: 1) prunes- contain high amounts of fiber.  2) apples- has a form of dietary fiber called pectin that accelerates stool movement and increases beneficial gut bacteria 3) pears- in addition to fiber, also high in fructose and sorbitol which have laxative effect 4) figs-  contain an enzyme ficin which helps to speed colonic transit 5) kiwis- contain an enzyme actinidin that improves gut motility and reduces constipation 6) oranges- rich in pectin (like apples) 7) grapefruits- contain a flavanol naringenin which has a laxative effect 8) vegetables- rich in fiber and also great sources of folate, vitamin C, and K 9) artichoke- high in inulin, prebiotic great for the microbiome 10) chicory- increases stool frequency and softness (can be added to coffee) 11) rhubarb- laxative effect 12) sweet potato- high fiber 13) beans, peas, and lentils- contain both soluble and insoluble fiber 14) chia seeds- improves intestinal health and gut flora 15) flaxseeds- laxative effect 16) whole grain rye bread- high in fiber 17) oat bran- high in soluble and insoluble fiber 18) kefir- softens stools -recommended to try at least one of these foods every day.  -drink 6-8 glasses of  water per day -walk regularly, especially after meals.   11) Depression -discussed considering making her friend her power of attorney given her fears that her daughter does not have her best interest and may place her in a nursing home.  -encouraged her to spend time with her close friend and others who are a positive influence in her life  76) Fall -discussed that all her amitriptyline, tizanidine, seroquel, lyrica, and robaxin could contribute to confusion and falls, especially the combined effect of all of these. Commended on reducing tizanidine to 1 tablet per night. Decrease amitriptyline to '125mg'$  -ordered XR of lower back to assess for fracture given new onset sciatica after most recent fall -discussed use of meloxciam 7.'5mg'$  prn for pain -encouraged weaning the medications that seem to be least helpful to her -decided together to decrease tizanidine to 1 pill at a time at night -asked her to let me know if this change improves her symptoms  13) Dry mouth: -decrease amitriptyline to '125mg'$  HS. Discussed that this is likely contributor to dry mouth  14) Multiple cavities: -avoid added sugar, especially in drink form -advised neem, vegetables, fruits, nuts.   15) ?Statin-induced myalgias Decrease crestor to '10mg'$  daily.  -Provided with list of supplements that can help with dyslipidemia: 1) Vitamin B3 500-4,'000mg'$  in divided doses daily (would recommend starting low as can cause uncomfortable facial flushing if started at too high a dose) 2) Phytosterols 2.15 grams daily 3) Fermented soy 30-50 grams daily 4) EGCG (found in green tea): 500-'1000mg'$  daily 5) Omega-3 fatty acids 3000-5,'000mg'$  daily 6) Flax seed 40 grams daily 7) Monounsaturated fats 20-40 grams daily (olives, olive oil, nuts), also reduces cardiovascular disease 8) Sesame: 40 grams daily 9) Gamma/delta tocotrienols- a family of unsaturated forms of Vitamin E- '200mg'$  with dinner 10) Pantethine '900mg'$  daily in divided  doses 11) Resveratrol '250mg'$  daily 12) N Acetyl Cysteine '2000mg'$  daily in divided doses 13) Curcumin 2000-'5000mg'$  in divided doses daily 14) Pomegranate juice: 8 ounces daily, also helps to lower blood pressure 15) Pomegranate seeds one cup daily, also helps to lower blood pressure 16) Citrus Bergamot '1000mg'$  daily, also helps with glucose control and weight loss 17) Vitamin C '500mg'$  daily 18) Quercetin 500-'1000mg'$  daily 19) Glutathione 20) Probiotics 60-100 billion organisms per day 21) Fiber 22) Oats 23) Aged garlic (can eat as food or supplement of 600-'900mg'$  per day) 24) Chia seeds 25 grams per day 25) Lycopene- carotenoid found in high concentrations in tomatoes. 26) Alpha linolenic acid 27) Flavonoids and anthocyanins 28) Wogonin- flavanoid that enhances reverse cholesterol transport 29) Coenzyme Q10 30) Pantethine- derivative of Vitamin B5: '300mg'$  three times  per day or '450mg'$  twice per day with or without food 31) Barley and other whole grains 32) Orange juice 33) L- carnitine 34) L- Lysine 35) L- Arginine 36) Almonds 37) Morin 38) Rutin 39) Carnosine 40) Histidine  41) Kaempferol  42) Organosulfur compounds 43) Vitamin E 44) Oleic acid 45) RBO (ferulic acid gammaoryzanol) 46) grape seed extract 47) Red wine 48) Berberine HCL '500mg'$  daily or twice per day- more effective and with fewer adverse effects that ezetimibe monotherapy 49) red yeast rice 2400- 4800 mg/day 50) chlorella 51) Licorice   8 minutes spent in discussion of her fall, its severity, her post fall lumbar spinal pain, recommending lumbar XR and meloxciam

## 2022-06-11 ENCOUNTER — Emergency Department (HOSPITAL_COMMUNITY): Payer: 59

## 2022-06-11 ENCOUNTER — Other Ambulatory Visit: Payer: Self-pay

## 2022-06-11 ENCOUNTER — Emergency Department (HOSPITAL_COMMUNITY)
Admission: EM | Admit: 2022-06-11 | Discharge: 2022-06-11 | Disposition: A | Discharge status: Home / Self Care | Payer: 59 | Attending: Emergency Medicine | Admitting: Emergency Medicine

## 2022-06-11 DIAGNOSIS — W1830XA Fall on same level, unspecified, initial encounter: Secondary | ICD-10-CM | POA: Diagnosis not present

## 2022-06-11 DIAGNOSIS — Y93G9 Activity, other involving cooking and grilling: Secondary | ICD-10-CM | POA: Diagnosis not present

## 2022-06-11 DIAGNOSIS — R519 Headache, unspecified: Secondary | ICD-10-CM | POA: Diagnosis present

## 2022-06-11 DIAGNOSIS — Y92000 Kitchen of unspecified non-institutional (private) residence as  the place of occurrence of the external cause: Secondary | ICD-10-CM | POA: Diagnosis not present

## 2022-06-11 DIAGNOSIS — M545 Low back pain, unspecified: Secondary | ICD-10-CM | POA: Diagnosis not present

## 2022-06-11 DIAGNOSIS — M542 Cervicalgia: Secondary | ICD-10-CM | POA: Diagnosis not present

## 2022-06-11 DIAGNOSIS — W19XXXA Unspecified fall, initial encounter: Secondary | ICD-10-CM

## 2022-06-11 LAB — URINALYSIS, ROUTINE W REFLEX MICROSCOPIC
Bilirubin Urine: NEGATIVE
Glucose, UA: NEGATIVE mg/dL
Hgb urine dipstick: NEGATIVE
Ketones, ur: NEGATIVE mg/dL
Leukocytes,Ua: NEGATIVE
Nitrite: NEGATIVE
Protein, ur: NEGATIVE mg/dL
Specific Gravity, Urine: 1.009 (ref 1.005–1.030)
pH: 7 (ref 5.0–8.0)

## 2022-06-11 NOTE — ED Notes (Signed)
Pt ambulated to toilet with 1 person assistance...steady gait.

## 2022-06-11 NOTE — ED Triage Notes (Signed)
Pt with multiple recent falls (and has been worked up for same) here after falling in her kitchen while making a sandwich. No blood thinners, no LOC, no dizziness prior to the fall. C collar in place by EMS, pt has pain to posterior head and upper back.

## 2022-06-12 ENCOUNTER — Telehealth (HOSPITAL_COMMUNITY): Payer: Self-pay | Admitting: *Deleted

## 2022-06-13 ENCOUNTER — Telehealth: Payer: Self-pay

## 2022-06-13 ENCOUNTER — Other Ambulatory Visit: Payer: Self-pay | Admitting: Physical Medicine and Rehabilitation

## 2022-06-13 ENCOUNTER — Telehealth (HOSPITAL_COMMUNITY): Payer: Self-pay | Admitting: *Deleted

## 2022-06-13 MED ORDER — TIZANIDINE HCL 4 MG PO TABS: 4.0000 mg | ORAL_TABLET | Freq: Three times a day (TID) | ORAL | 0 refills | Status: DC

## 2022-06-13 NOTE — Telephone Encounter (Signed)
Please call her to discuss with the pharmacist about number of pills.  We are not going to give any early refills of her medication.

## 2022-06-13 NOTE — Telephone Encounter (Signed)
Pharmacy is sending a refill for Tizanidine 4 mg

## 2022-06-13 NOTE — Telephone Encounter (Signed)
Oh I did speak with her today and reinforced all of those things. Pt was advised to cut what she has in half until appointment.

## 2022-06-13 NOTE — Telephone Encounter (Signed)
Writer spoke with pt as she has called again regarding refilling the Klonopin 0.5 mg, which per [facility] was filled for #90 on 03/26/22. Pt continues to state that she has only #5, #7, left. Pt denies picking up that script. Pt upset asking what is she supposed to do until her appointment on 06/25/22. Writer reiterated that she was given enough to last until appointment on 06/25/22. Pt again she only has #5 pills and he will be bouncing off the walls. Writer advised cutting medication in half and then discussing on next appointment. Pt stated that she will call her pain provider and ask them for script. Pt also says that her Lyrica was up for refill and so writer advised to reach out for refill as this may help not having the Klonopin, however on looking at [facility] Lyrica last scripted for # 90 on 05/17/22 with 1 refill. Writer also reinforced that pt has been falling frequently and this could be medication related. Pt disagrees. Says she will call back if she cannot get anything from pain doc (Klonopin primarily). FYI.

## 2022-06-13 NOTE — Telephone Encounter (Signed)
Patient called and stated she fell on Monday and hit her head. She did go to the ER and they kept her about 10 hours. She states she is in so much pain since the fall. She would like Dr. Ranell Patrick to give her a call.

## 2022-06-20 ENCOUNTER — Telehealth: Payer: Self-pay

## 2022-06-20 NOTE — Telephone Encounter (Signed)
  Dr. Ranell Patrick is not in the office  today.   Kendra Simmons is down to her last Clonazepam. Patient has been advised to call her PCP. Call back phone number [phone].

## 2022-06-21 ENCOUNTER — Other Ambulatory Visit (HOSPITAL_COMMUNITY): Payer: Self-pay | Admitting: Psychiatry

## 2022-06-21 ENCOUNTER — Encounter: Payer: 59 | Attending: Physical Medicine and Rehabilitation | Admitting: Physical Medicine and Rehabilitation

## 2022-06-21 DIAGNOSIS — F411 Generalized anxiety disorder: Secondary | ICD-10-CM

## 2022-06-21 DIAGNOSIS — Z79899 Other long term (current) drug therapy: Secondary | ICD-10-CM | POA: Insufficient documentation

## 2022-06-21 DIAGNOSIS — F330 Major depressive disorder, recurrent, mild: Secondary | ICD-10-CM

## 2022-06-23 ENCOUNTER — Other Ambulatory Visit: Payer: Self-pay | Admitting: Orthopaedic Surgery

## 2022-06-25 ENCOUNTER — Telehealth (HOSPITAL_BASED_OUTPATIENT_CLINIC_OR_DEPARTMENT_OTHER): Payer: 59 | Admitting: Psychiatry

## 2022-06-25 ENCOUNTER — Encounter (HOSPITAL_COMMUNITY): Payer: Self-pay | Admitting: Psychiatry

## 2022-06-25 DIAGNOSIS — F330 Major depressive disorder, recurrent, mild: Secondary | ICD-10-CM

## 2022-06-25 DIAGNOSIS — F411 Generalized anxiety disorder: Secondary | ICD-10-CM

## 2022-06-25 MED ORDER — BUPROPION HCL ER (XL) 150 MG PO TB24: ORAL_TABLET | ORAL | 0 refills | Status: DC

## 2022-06-25 NOTE — Progress Notes (Signed)
Virtual Visit via Telephone Note  I connected with Kendra Simmons on 06/25/22 at  3:40 PM EDT by telephone and verified that I am speaking with the correct person using two identifiers.  Location: Patient: Home Provider: Home Office   I discussed the limitations, risks, security and privacy concerns of performing an evaluation and management service by telephone and the availability of in person appointments. I also discussed with the patient that there may be a patient responsible charge related to this service. The patient expressed understanding and agreed to proceed.   History of Present Illness: Patient is evaluated by phone session.  She had called multiple times asking early refills for Klonopin.  When asked about that she has enough Klonopin she told she is not sure about it.  She also had falls and hit her head and seen in the emergency room.  I explained that she is taking too many medication and most of them are given by other provider and in the future she should stay with same provider who is managing most of her psychotropic medication.  Her physical rehab physician has given amitriptyline, Seroquel, Robaxin, Lyrica.  She minimizes her polypharmacy and we have recommended to see a therapist and she feel at this time does not need a therapist.  She is out of Klonopin for past few days but not sure why.  She reported her family situation is better as daughter is working and not as much stress.  She endorsed chronic issues with the family members but does not want to bring and does not want to address in therapy.  She is taking moderate dose of Seroquel and she feels that helps her sleep.  She denies any suicidal thoughts, hallucination or any crying spells.  She reported her appetite is fair.  Her weight is stable.  Past Psychiatric History:  H/O multiple inpatient and IOP. Last inpatient June 2016. H/O hallucination, paranoia and depression. Saw [name], [name] and Dr.  Thurmond Butts.  Tried Xanax, amitriptyline, imipramine, temazepam, trazodone, Zyprexa, Luvox, Wellbutrin, Prozac, Vistaril, Remeron and Bellsomra.      Psychiatric Specialty Exam: Physical Exam  Review of Systems  Weight 194 lb (88 kg).There is no height or weight on file to calculate BMI.  General Appearance: NA  Eye Contact:  NA  Speech:  Slow  Volume:  Decreased  Mood:  Dysphoric  Affect:  NA  Thought Process:  Descriptions of Associations: Intact  Orientation:  Full (Time, Place, and Person)  Thought Content:  Rumination  Suicidal Thoughts:  No  Homicidal Thoughts:  No  Memory:  Immediate;   Fair Recent;   Fair Remote;   Fair  Judgement:  Fair  Insight:  Shallow  Psychomotor Activity:  NA  Concentration:  Concentration: Fair and Attention Span: Fair  Recall:  [employer] of Knowledge:  Fair  Language:  Fair  Akathisia:  No  Handed:  Right  AIMS (if indicated):     Assets:  Communication Skills Desire for Improvement Housing  ADL's:  Intact  Cognition:  WNL  Sleep:   ok      Assessment and Plan: Major depressive disorder, recurrent.  Generalized anxiety disorder.  I again discussed polypharmacy.  Patient is very reluctant to cut down the medication and like to have Klonopin which she has been out for past few days and does not exhibit any withdrawal symptoms.  Her physical rehab doctor has managing most of her psychotropic medication including Seroquel, amitriptyline along with muscle  relaxant and Lyrica.  I feel comfortable she should stay with 1 provider.  I will give her one time Wellbutrin XL 150 mg in the morning number 90 pills but in the future she will get care at her physical rehab.  I will follow her note.  Patient is also not willing to do therapy which I believe very important given her family situation.  Patient lives with her daughter.  I also review recent ER visits which was due to dizziness and she hit her head.  Concerns about polypharmacy discussed.   Recommended if she has any suicidal thoughts or homicidal thoughts then she need to call 911.  We will not schedule any more appointments.  Follow Up Instructions:    I discussed the assessment and treatment plan with the patient. The patient was provided an opportunity to ask questions and all were answered. The patient agreed with the plan and demonstrated an understanding of the instructions.   The patient was advised to call back or seek an in-person evaluation if the symptoms worsen or if the condition fails to improve as anticipated.  Collaboration of Care: Primary Care Provider AEB notes are available in epic to review.  Patient/Guardian was advised Release of Information must be obtained prior to any record release in order to collaborate their care with an outside provider. Patient/Guardian was advised if they have not already done so to contact the registration department to sign all necessary forms in order for [place] to release information regarding their care.   Consent: Patient/Guardian gives verbal consent for treatment and assignment of benefits for services provided during this visit. Patient/Guardian expressed understanding and agreed to proceed.    I provided 30 minutes of non-face-to-face time during this encounter.   [name], MD

## 2022-06-26 ENCOUNTER — Telehealth: Payer: Self-pay | Admitting: *Deleted

## 2022-06-26 NOTE — Telephone Encounter (Signed)
Sonny called and stated that she spoke with her psych doctor and he wants to turn her medication prescribing over to Dr Ranell Patrick. (She is off clonazepam). She said to call her if Dr Ranell Patrick has questions.

## 2022-06-26 NOTE — Telephone Encounter (Signed)
She says the numbers are good. She answered for me on her mobile line.

## 2022-06-28 ENCOUNTER — Telehealth: Payer: Self-pay

## 2022-06-28 NOTE — Telephone Encounter (Signed)
Patient called to get a return call from Dr. Ranell Patrick. She wants to discuss changing some of her medications

## 2022-06-29 ENCOUNTER — Encounter: Payer: 59 | Admitting: Physical Medicine and Rehabilitation

## 2022-06-29 DIAGNOSIS — Z79899 Other long term (current) drug therapy: Secondary | ICD-10-CM

## 2022-06-30 NOTE — Progress Notes (Signed)
Left message for patient with my phone number for call back

## 2022-07-02 ENCOUNTER — Telehealth: Payer: Self-pay | Admitting: Physical Medicine and Rehabilitation

## 2022-07-02 NOTE — Telephone Encounter (Signed)
She would like to discuss you taking over all her medications.

## 2022-07-04 ENCOUNTER — Ambulatory Visit: Payer: 59 | Admitting: Psychology

## 2022-07-11 ENCOUNTER — Telehealth: Payer: Self-pay

## 2022-07-11 ENCOUNTER — Encounter (HOSPITAL_BASED_OUTPATIENT_CLINIC_OR_DEPARTMENT_OTHER): Payer: 59 | Admitting: Physical Medicine and Rehabilitation

## 2022-07-11 ENCOUNTER — Other Ambulatory Visit: Payer: Self-pay | Admitting: Physical Medicine and Rehabilitation

## 2022-07-11 DIAGNOSIS — F411 Generalized anxiety disorder: Secondary | ICD-10-CM

## 2022-07-11 MED ORDER — QUETIAPINE FUMARATE 300 MG PO TABS: 300.0000 mg | ORAL_TABLET | Freq: Every day | ORAL | 3 refills | Status: DC

## 2022-07-11 NOTE — Telephone Encounter (Signed)
Patient call about refill  for Rx Seroquel. Rx already submitted by Dr. Ranell Patrick. Patient informed.

## 2022-07-11 NOTE — Progress Notes (Signed)
Subjective:    Patient ID: Kendra Simmons, female    DOB: [birth year], 71 y.o.   MRN: [id number]  HPI  An audio/video tele-health visit is felt to be the most appropriate encounter for this patient at this time. This is a follow up tele-visit via phone. The patient is at home. MD is at office. Prior to scheduling this appointment, our staff discussed the limitations of evaluation and management by telemedicine and the availability of in-person appointments. The patient expressed understanding and agreed to proceed.   Kendra Simmons is a 71 year old woman who presents for f/u of falls, anxiety, pain, depression, trochanteric bursitis, and insomnia.   1) Bilateral greater trochanteric pain syndrome Only received 3 days of benefit from steroid injection and now pain has returned -she continues to use supportive pillows at night -she got a new mattress that she thinks is good quality, but has noticed worsening low back and hip pain since using this -she pain limites her from cleaning and doing activities around the house -she has used tylenol and ibuprofen for relief.  -she has not tried [employer] -She has been taking Robaxin- sometimes none, sometimes up 2 two pills per day and she asks if this is ok -She has been using voltaren gel -She has ordered a pillow to help offload her lateral hips- she will get this 3/10 -pain continues to be quite severe and interferes with her sleep.  -she asks if it is ok to take up to 3 Tizanidine at night.   2) Cervical myofascial pain syndrome: -muscles feel very tight near her scapula and upper back -She has been using heating pad every day.  -She notes poor posture.  -She has had injections in her upper back before.   3) Obesity: -She got down to 180 and climbed back up again to 192.  -BMI is 32.96 -She has been considering lap band surgery. She is not sure if she would be approved for this.  -She plans to walk more in the summer.  3)  Prediabetes  4) HLD  5) Depression:  -She takes Wellbutrin.  -she has been following with a psychiatrist -her daughter often does not talk to her and when she does she can be very hurtful to her -she fears that one day her daughter may put her in a nursing home -she does have a really close friend of 62 years whom she is seeing the show Cats with, she was very happy on Christmas when her friend gave her these tickers -her son used to be very loving but has been more involved with his wife and children and has not seen her for 7 years. He is her power of attorney.   6) Anxiety: she has been ruminating a lot.  -this prevents her from sleeping at night -she needs a refill of her Seroquel today -she has been having a lot more anxiety recently due to the death of her sister 1 month ago -she also has anxiety regarding the cats she is caring for at home and finding them adoptive families -she also has anxiety regarding the relationship with her daughter  71) Insomnia:  -She has been taking Seroquel and it seems to have stopped helping, but she has discussed with her psychiatrist and he would prefer for her not to decrease this medicine any further.  -she finds benefit from the amitriptyline and asks if this can be increased -this has been a problem for her as long as she  can remember. -she asks if it would be ok to take up to 3 trazodone at night  -has ruminating thoughts at night  8) Constipation: -she has been taking colace  9) Fall -she just had a recent fall which she feels is her worst yet -she has been having sciatica since this fall -she would like to get repeat XR -she does not think fall was related to amitriptyline -she does have meloxicam on hand and has not used this in sometime -she had fall while visiting her daughter and had to go to the emergency room -experienced no orthopedic injuries fortunately. -felt give way weakness -asked if the increased dose of amitriptyline  could do this. She hopes not as it has helped her to sleep better.  -MRI brain showed no significant abnormalities -had a high adjustable bed but it is messed up and she wants one lower.  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  12) Speech is getting worse -some days are worse than others  13) Stress from home situation -had a good mother's day and her daughter was kinder to her -she was told her mother cat has leukemia  Prior history:  She does not eat anything until lunch time. She eats a lot of strawberries and lunch. She feels she needs to lay off the bread. Her daughter bakes pizza and then it is hard for her to resist this. She eats a lot of broccoli ad stir fired vegetables. She drinks lemonade. She went to the dentist and was advised not to eat tea. If she eats a big lunch she does not eat much supper.   She takes probiotics and omeprazole.   Prior history:  Kendra Simmons is a 71 year-old woman who presents today for follow-up of her lower back pain and bilateral hip pain. Her pain is usually well controlled but she often has spasms during the day and before she sleeps at night. She has been taking Tizanidine '4mg'$  HS and this has been helping her. She has Tramadol prescribed but has been taking this sparingly and does not feel much relief from it. Once when she had additional spasms during the day she took the Tizanidine and felt very sleepy afterward She has done therapy in the past and does not want to again at this time, though she knows she needs to exercise more. She used to walk in her church, but it is closed to walking now due to the pandemic.  Since last visit I performed bilateral greater trochancteric bursa injections and her pain has decreased from 6 to 4. Her Lyrica dose has been decreased by Dr. Adele Schilder, her  psychiatrist, to avoid polypharmacy, and she feels this change has increased her pain and insomnia.    During today's visit she discusses extensively about her family stressors and how they are impacting her quality of life. She has regular appointments with Dr. Adele Schilder as well that she finds very helpful.   Pain Inventory Average Pain 8 Pain Right Now 9 My pain is dull, stabbing, and aching  In the last 24 hours, has pain interfered with the following? General activity 5 Relation with others 6 Enjoyment of life 5 What TIME of day is your pain at its worst? morning Sleep (in general) Fair  Pain is worse with: bending, standing, and some activites  Pain improves with: rest and medication Relief from Meds: 5   Family History  Problem Relation Age of Onset   Cancer Mother    Diabetes Mother    Sleep apnea Father    Alcohol abuse Father    Diabetes Sister    Diabetes Maternal Uncle    Diabetes Cousin    Social History   Socioeconomic History   Marital status: Divorced    Spouse name: Not on file   Number of children: 3   Years of education: 14   Highest education level: Not on file  Occupational History   Occupation: Retired  Tobacco Use   Smoking status: Never   Smokeless tobacco: Never  Scientific laboratory technician Use: Never used  Substance and Sexual Activity   Alcohol use: No    Alcohol/week: 0.0 standard drinks of alcohol    Comment: zero   Drug use: No    Types: Barbituates, Benzodiazepines    Comment: Denies any drug use other than benzos   Sexual activity: Never    Birth control/protection: Abstinence  Other Topics Concern   Not on file  Social History Narrative   Patient lives at home, daughter with her.    Caffeine Use:  2 cokes daily   Sister lives next door.   Social Determinants of Health   Financial Resource Strain: Not on file  Food Insecurity: Not on file  Transportation Needs: Not on file  Physical Activity: Not on file  Stress: Not on file   Social Connections: Not on file   Past Surgical History:  Procedure Laterality Date   BOTOX INJECTION N/A 10/17/2021   Procedure: BOTOX INJECTION;  Surgeon: Clarene Essex, MD;  Location: [facility];  Service: Endoscopy;  Laterality: N/A;   CESAREAN SECTION     COSMETIC SURGERY     ESOPHAGEAL MANOMETRY N/A 09/26/2016   Procedure: ESOPHAGEAL MANOMETRY (EM);  Surgeon: Clarene Essex, MD;  Location: [facility];  Service: Endoscopy;  Laterality: N/A;   ESOPHAGOGASTRODUODENOSCOPY (EGD) WITH PROPOFOL N/A 10/17/2021   Procedure: ESOPHAGOGASTRODUODENOSCOPY (EGD) WITH PROPOFOL;  Surgeon: Clarene Essex, MD;  Location: [facility];  Service: Endoscopy;  Laterality: N/A;   laproscopy     Past Medical History:  Diagnosis Date   Anxiety    Arthritis    Depression    Emotional depression 02/04/2015   Frequent falls    GERD (gastroesophageal reflux disease)    High cholesterol    Insomnia    Memory loss    Transient alteration of awareness    There were no vitals taken for this visit.  Opioid Risk Score:   Fall Risk Score:  `1  Depression screen [facility] 2/9     04/24/2022   10:08 AM 08/24/2021    9:39 AM 07/04/2021    2:25 PM 11/30/2020   10:19 AM 03/09/2020    3:14 PM 09/12/2015   12:09 PM  Depression screen PHQ 2/9  Decreased Interest 1 0 3 3 0   Down, Depressed, Hopeless 1 0 3 3 0   PHQ - 2 Score 2 0 6 6 0   Altered sleeping        Tired, decreased energy        Change in appetite        Feeling bad or failure about yourself         Trouble concentrating        Moving slowly or fidgety/restless        Suicidal thoughts  PHQ-9 Score        Difficult doing work/chores           Information is confidential and restricted. Go to Review Flowsheets to unlock data.     Review of Systems  Musculoskeletal:  Positive for myalgias and neck pain.       Shoulder pain Scapula pain   All other systems reviewed and are negative.      Objective:   Physical Exam Not  performed  Assessment & Plan:   Kendra Simmons is a 71 year-old woman who presents today for follow-up of her lower back pain and bilateral greater trochanteric bursa pain syndrome.  1) Bilateral greater trochanteric pain syndrome: - Provided referral for PT to focus on stretching and strengthening of the hip abductors, myofascial release, modalities, HEP  -Discontinue steroid injections given waning improvement and risks. Messaged Renae to schedule her tomorrow for bilateral hip steroid injections -recommended applying Tiger Balm, discussed the ingredients in this -continue tizanidine, robaxin, amitriptyline, Lyrica, tylenol, and ibuprofen -recommended applying tiger balm -continue supportive pillows  -Currently worse in the right- discussed benefits of pregnancy pillow  -Received two greater trochanteric bursa injections on 2/26 and 3/03 with excellent results. By now thise results have worn off. She continues to take the [facility]- she usually takes only when she has to sweep and mop. She uses voltaren gel. She does not have to use this very often.   -Continue Robaxin. Can take up to 2 per day. Has enough Tizanidine- can take up to 3 per day- LFTs reviewed and stable, new labs recently obtained. Increased dose of Lyrica to '75mg'$  for pain and insomnia. Use proper bed.   Continue Lyrica. Provided refill 3/2.   -Discussed that it best to avoid opioids, even tramadol, for chronic pain due to the risk of tolerance and dependence.    2) Insomnia: -She has been using the Tizanidine more often prescribed to help her sleep- advised to try to use no more than three times per day.  -no benefits with trazodone.  -Increased Lyrica to '100mg'$  for pain, and this will also help with sleep.  -She plans to purchase the pregnancy  -Repeat LFTs on w/ labs with PCP.  -Decrease amitriptyline to '125mg'$  due to its side effects.  -Try to go outside near sunrise -Get exercise during the day.  -Discussed  good sleep hygiene: turning off all devices an hour before bedtime.  -Chamomile tea with dinner.  -Can consider over the counter melatonin -continue amitriptyline '125mg'$ .   3) Prediabetes:  -Patient states she was recently diagnosed with diabetes Type 2. I have personally reviewed her labs and provided dietary and exercise advice. She has lost 13 lbs since our visit 3 months ago! Discussed her current diet.  -Continue Trulicity to help curb her appetite.   4) Obesity: -Educated regarding health benefits of weight loss- for pain, general health, chronic disease prevention, immune health, mental health.  -188 lbs -Will monitor weight every visit.  -Consider Roobois tea daily.  -Discussed foods that can assist in weight loss: 1) leafy greens- high in fiber and nutrients 2) dark chocolate- improves metabolism (if prefer sweetened, best to sweeten with honey instead of sugar).  3) cruciferous vegetables- high in fiber and protein 4) full fat yogurt: high in healthy fat, protein, calcium, and probiotics 5) apples- high in a variety of phytochemicals 6) nuts- high in fiber and protein that increase feelings of fullness 7) grapefruit: rich in nutrients, antioxidants, and fiber (not to be  taken with anticoagulation) 8) beans- high in protein and fiber 9) salmon- has high quality protein and healthy fats 10) green tea- rich in polyphenols 11) eggs- rich in choline and vitamin D 12) tuna- high protein, boosts metabolism 13) avocado- decreases visceral abdominal fat 14) chicken (pasture raised): high in protein and iron 15) blueberries- reduce abdominal fat and cholesterol 16) whole grains- decreases calories retained during digestion, speeds metabolism 17) chia seeds- curb appetite 18) chilies- increases fat metabolism  -Discussed supplements that can be used:  1) Metatrim '400mg'$  BID 30 minutes before breakfast and dinner  2) Sphaeranthus indicus and Garcinia mangostana (combinations of these  and #1 can be found in capsicum and zychrome  3) green coffee bean extract '400mg'$  twice per day or Irvingia (african mango) 150 to '300mg'$  twice per day.   5) Osteopenia: Vitamin D supplement, calcium supplement. Weight-bearing exercise. Continue Cubie to exercise while watching television.   6) B12 deficiency: Continue supplement.   7) General health:  --Recommended use of Down Dog app to do 15 min of daily yoga with her daughter. Advised that this will help with both hip and low back pain.  Recommended eating pain relieving foods and provided with a handout.  Recommended Roobois tea for its multiple benefits.    --Discussed family stressors extensively. Her anxiety and depressed mood given her family is a large component of increased inflammation and pain. Advised that she focus on the positive relationships in her life and the things that she enjoys to reduce her stress and grief. Discussed her recent stressor regarding a cat her daughter brought in that is stressing her other cats.   -She has thought a lot about getting back to work. 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 Encouraged her to do this! I think that is a wonderful idea!  8) Cervical myofascial pain syndrome: -Continue heat -She would like to try trigger point injections in future.  -Messaged her cervical pillow she can try  9) Anxiety:  -Discussed her increased anxiety lately due to a cat family she found and is caring for. Discussed I would look out for anyone interested in adopting a cat.  -discussed stressors, positives in her life Seroquel appears to have stopped helping. Wean to '300mg'$  Discussed benefits of meditation Increase amitriptyline to '50mg'$ .  -Discussed exercise and meditation as tools to decrease anxiety. -Recommended Down Dog Yoga app -Discussed spending time outdoors. -Discussed positive re-framing of anxiety.  -Discussed the following foods that have been show to reduce  anxiety: 1) [place] nuts, mushrooms, soy beans due to their high selenium content. Upper limit of toxicity of selenium is 422mg/day so no more than 3-4 bBolivianuts per day.  2) Fatty fish such as salmon, mackerel, sardines, trout, and herring- high in omega-3 fatty acids 3) Eggs- increases serotonin and dopamine 4) Pumpkin seeds- high in omega-3 fatty acids 5) dark chocolate- high in flavanols that increase blood flow to brain 6) turmeric- take with black pepper to increase absorption 7) chamomile tea- antioxidant and anti-inflammatory properties 8) yogurt without sugar- supports gut-brain axis 9) green tea- contains L- theanine 10) blueberries- high in vitamin C and antioxidants 11) [place] high in tryptophan which gets converted to serotonin 12) bell peppers- rich in vitamin C and antioxidants 13) citrus fruits- rich in vitamin C and antioxidants 14) almonds- high in vitamin E and healthy fats 15) chia seeds- high in omega-3 fatty acids   10) Constipation:  -Provided list of following  foods that help with constipation and highlighted a few: 1) prunes- contain high amounts of fiber.  2) apples- has a form of dietary fiber called pectin that accelerates stool movement and increases beneficial gut bacteria 3) pears- in addition to fiber, also high in fructose and sorbitol which have laxative effect 4) figs- contain an enzyme ficin which helps to speed colonic transit 5) kiwis- contain an enzyme actinidin that improves gut motility and reduces constipation 6) oranges- rich in pectin (like apples) 7) grapefruits- contain a flavanol naringenin which has a laxative effect 8) vegetables- rich in fiber and also great sources of folate, vitamin C, and K 9) artichoke- high in inulin, prebiotic great for the microbiome 10) chicory- increases stool frequency and softness (can be added to coffee) 11) rhubarb- laxative effect 12) sweet potato- high fiber 13) beans, peas, and lentils- contain both  soluble and insoluble fiber 14) chia seeds- improves intestinal health and gut flora 15) flaxseeds- laxative effect 16) whole grain rye bread- high in fiber 17) oat bran- high in soluble and insoluble fiber 18) kefir- softens stools -recommended to try at least one of these foods every day.  -drink 6-8 glasses of water per day -walk regularly, especially after meals.   11) Depression -discussed considering making her friend her power of attorney given her fears that her daughter does not have her best interest and may place her in a nursing home.  -encouraged her to spend time with her close friend and others who are a positive influence in her life  51) Fall -discussed that all her amitriptyline, tizanidine, seroquel, lyrica, and robaxin could contribute to confusion and falls, especially the combined effect of all of these. Commended on reducing tizanidine to 1 tablet per night. Decrease amitriptyline to '125mg'$  -ordered XR of lower back to assess for fracture given new onset sciatica after most recent fall -discussed use of meloxciam 7.'5mg'$  prn for pain -encouraged weaning the medications that seem to be least helpful to her -decided together to decrease tizanidine to 1 pill at a time at night -asked her to let me know if this change improves her symptoms  13) Dry mouth: -decrease amitriptyline to '125mg'$  HS. Discussed that this is likely contributor to dry mouth  14) Multiple cavities: -avoid added sugar, especially in drink form -advised neem, vegetables, fruits, nuts.   15) ?Statin-induced myalgias Decrease crestor to '10mg'$  daily.  -Provided with list of supplements that can help with dyslipidemia: 1) Vitamin B3 500-4,'000mg'$  in divided doses daily (would recommend starting low as can cause uncomfortable facial flushing if started at too high a dose) 2) Phytosterols 2.15 grams daily 3) Fermented soy 30-50 grams daily 4) EGCG (found in green tea): 500-'1000mg'$  daily 5) Omega-3 fatty  acids 3000-5,'000mg'$  daily 6) Flax seed 40 grams daily 7) Monounsaturated fats 20-40 grams daily (olives, olive oil, nuts), also reduces cardiovascular disease 8) Sesame: 40 grams daily 9) Gamma/delta tocotrienols- a family of unsaturated forms of Vitamin E- '200mg'$  with dinner 10) Pantethine '900mg'$  daily in divided doses 11) Resveratrol '250mg'$  daily 12) N Acetyl Cysteine '2000mg'$  daily in divided doses 13) Curcumin 2000-'5000mg'$  in divided doses daily 14) Pomegranate juice: 8 ounces daily, also helps to lower blood pressure 15) Pomegranate seeds one cup daily, also helps to lower blood pressure 16) Citrus Bergamot '1000mg'$  daily, also helps with glucose control and weight loss 17) Vitamin C '500mg'$  daily 18) Quercetin 500-'1000mg'$  daily 19) Glutathione 20) Probiotics 60-100 billion organisms per day 21) Fiber 22) Oats 23) Aged garlic (  can eat as food or supplement of 600-'900mg'$  per day) 24) Chia seeds 25 grams per day 25) Lycopene- carotenoid found in high concentrations in tomatoes. 26) Alpha linolenic acid 27) Flavonoids and anthocyanins 28) Wogonin- flavanoid that enhances reverse cholesterol transport 29) Coenzyme Q10 30) Pantethine- derivative of Vitamin B5: '300mg'$  three times per day or '450mg'$  twice per day with or without food 31) Barley and other whole grains 32) Orange juice 33) L- carnitine 34) L- Lysine 35) L- Arginine 36) Almonds 37) Morin 38) Rutin 39) Carnosine 40) Histidine  41) Kaempferol  42) Organosulfur compounds 43) Vitamin E 44) Oleic acid 45) RBO (ferulic acid gammaoryzanol) 46) grape seed extract 47) Red wine 48) Berberine HCL '500mg'$  daily or twice per day- more effective and with fewer adverse effects that ezetimibe monotherapy 49) red yeast rice 2400- 4800 mg/day 50) chlorella 51) Licorice   7 minutes spent in discussion of her anxiety, refilling her Seroquel, confirming her dose of HS '300mg'$ , discussing her recent stress regarding the death of her sister,  adoption of her cats, and relationship with her daughter

## 2022-07-24 ENCOUNTER — Encounter: Payer: 59 | Attending: Physical Medicine and Rehabilitation | Admitting: Physical Medicine and Rehabilitation

## 2022-07-24 VITALS — BP 126/82 | HR 95 | Ht 64.0 in | Wt 196.0 lb

## 2022-07-24 DIAGNOSIS — W19XXXA Unspecified fall, initial encounter: Secondary | ICD-10-CM | POA: Insufficient documentation

## 2022-07-24 DIAGNOSIS — Z6832 Body mass index (BMI) 32.0-32.9, adult: Secondary | ICD-10-CM | POA: Insufficient documentation

## 2022-07-24 DIAGNOSIS — R4189 Other symptoms and signs involving cognitive functions and awareness: Secondary | ICD-10-CM | POA: Insufficient documentation

## 2022-07-24 DIAGNOSIS — F411 Generalized anxiety disorder: Secondary | ICD-10-CM | POA: Insufficient documentation

## 2022-07-24 DIAGNOSIS — E119 Type 2 diabetes mellitus without complications: Secondary | ICD-10-CM | POA: Insufficient documentation

## 2022-07-24 DIAGNOSIS — Z794 Long term (current) use of insulin: Secondary | ICD-10-CM | POA: Insufficient documentation

## 2022-07-24 DIAGNOSIS — M25551 Pain in right hip: Secondary | ICD-10-CM | POA: Insufficient documentation

## 2022-07-24 DIAGNOSIS — M7918 Myalgia, other site: Secondary | ICD-10-CM | POA: Insufficient documentation

## 2022-07-24 DIAGNOSIS — R531 Weakness: Secondary | ICD-10-CM | POA: Insufficient documentation

## 2022-07-24 DIAGNOSIS — M7062 Trochanteric bursitis, left hip: Secondary | ICD-10-CM | POA: Diagnosis not present

## 2022-07-24 DIAGNOSIS — E785 Hyperlipidemia, unspecified: Secondary | ICD-10-CM | POA: Diagnosis not present

## 2022-07-24 DIAGNOSIS — R682 Dry mouth, unspecified: Secondary | ICD-10-CM | POA: Diagnosis not present

## 2022-07-24 DIAGNOSIS — M545 Low back pain, unspecified: Secondary | ICD-10-CM | POA: Diagnosis not present

## 2022-07-24 DIAGNOSIS — M48062 Spinal stenosis, lumbar region with neurogenic claudication: Secondary | ICD-10-CM | POA: Diagnosis not present

## 2022-07-24 DIAGNOSIS — K5900 Constipation, unspecified: Secondary | ICD-10-CM | POA: Insufficient documentation

## 2022-07-24 DIAGNOSIS — G4701 Insomnia due to medical condition: Secondary | ICD-10-CM | POA: Insufficient documentation

## 2022-07-24 DIAGNOSIS — R253 Fasciculation: Secondary | ICD-10-CM | POA: Insufficient documentation

## 2022-07-24 DIAGNOSIS — E538 Deficiency of other specified B group vitamins: Secondary | ICD-10-CM | POA: Insufficient documentation

## 2022-07-24 DIAGNOSIS — R29898 Other symptoms and signs involving the musculoskeletal system: Secondary | ICD-10-CM | POA: Diagnosis not present

## 2022-07-24 DIAGNOSIS — G4700 Insomnia, unspecified: Secondary | ICD-10-CM | POA: Insufficient documentation

## 2022-07-24 DIAGNOSIS — M8580 Other specified disorders of bone density and structure, unspecified site: Secondary | ICD-10-CM | POA: Diagnosis not present

## 2022-07-24 DIAGNOSIS — E669 Obesity, unspecified: Secondary | ICD-10-CM | POA: Insufficient documentation

## 2022-07-24 DIAGNOSIS — Z79899 Other long term (current) drug therapy: Secondary | ICD-10-CM | POA: Insufficient documentation

## 2022-07-24 DIAGNOSIS — F32A Depression, unspecified: Secondary | ICD-10-CM | POA: Insufficient documentation

## 2022-07-24 MED ORDER — BETAMETHASONE SOD PHOS & ACET 6 (3-3) MG/ML IJ SUSP: 12.0000 mg | Freq: Once | INTRAMUSCULAR | Status: DC

## 2022-07-24 MED ORDER — AMITRIPTYLINE HCL 150 MG PO TABS
150.0000 mg | ORAL_TABLET | Freq: Every day | ORAL | 3 refills | Status: DC
Start: 2022-07-24 — End: 2022-10-12

## 2022-07-24 MED ORDER — LIDOCAINE HCL 1 % IJ SOLN: 6.0000 mL | Freq: Once | INTRAMUSCULAR | Status: DC

## 2022-07-24 NOTE — Addendum Note (Signed)
Addended by: Izora Ribas on: 07/24/2022 11:25 AM   Modules accepted: Orders

## 2022-07-24 NOTE — Addendum Note (Signed)
Addended by: Izora Ribas on: 07/24/2022 01:04 PM   Modules accepted: Orders

## 2022-07-24 NOTE — Progress Notes (Signed)
Subjective:    Patient ID: Kendra Simmons, female    DOB: 1951/03/11, 71 y.o.   MRN: [id number]  HPI  Kendra Simmons is a 71 year old woman who presents for f/u of falls, anxiety, pain, depression, trochanteric bursitis, and insomnia.   1) Bilateral greater trochanteric pain syndrome -she would like to get her bilateral steroid injections today -she continues to use supportive pillows at night -she got a new mattress that she thinks is good quality, but has noticed worsening low back and hip pain since using this -she pain limites her from cleaning and doing activities around the house -she has used tylenol and ibuprofen for relief.  -she has not tried [employer] -She has been taking Robaxin- sometimes none, sometimes up 2 two pills per day and she asks if this is ok -She has been using voltaren gel -She has ordered a pillow to help offload her lateral hips- she will get this 3/10 -pain continues to be quite severe and interferes with her sleep.  -she asks if it is ok to take up to 3 Tizanidine at night.   2) Cervical myofascial pain syndrome: -muscles feel very tight near her scapula and upper back -She has been using heating pad every day.  -She notes poor posture.  -She has had injections in her upper back before.   3) Obesity: -She got down to 180 and climbed back up again to 192.  -BMI is 32.96 -She has been considering lap band surgery. She is not sure if she would be approved for this.  -She plans to walk more in the summer.  3) Prediabetes  4) HLD  5) Depression:  -She takes Wellbutrin.  -she has been following with a psychiatrist -her daughter often does not talk to her and when she does she can be very hurtful to her -she fears that one day her daughter may put her in a nursing home -she does have a really close friend of 44 years whom she is seeing the show Cats with, she was very happy on Christmas when her friend gave her these tickers -her son used  to be very loving but has been more involved with his wife and children and has not seen her for 7 years. He is her power of attorney.   6) Anxiety: she has been ruminating a lot.  -this prevents her from sleeping at night -she needs a refill of her Seroquel today -she has been having a lot more anxiety recently due to the death of her sister 1 month ago -she also has anxiety regarding the cats she is caring for at home and finding them adoptive families -she also has anxiety regarding the relationship with her daughter  67) Insomnia:  -She has been taking Seroquel and it seems to have stopped helping, but she has discussed with her psychiatrist and he would prefer for her not to decrease this medicine any further.  -she finds benefit from the amitriptyline and asks if this can be increased -this has been a problem for her as long as she can remember. -she asks if it would be ok to take up to 3 trazodone at night  -she has been taking melatonin -has ruminating thoughts at night  8) Constipation: -she has been taking colace  9) Fall -she just had a recent fall which she feels is her worst yet -she has no warning when she is going to fall -she has been having sciatica since this fall -  she would like to get repeat XR -she does not think fall was related to amitriptyline -she does have meloxicam on hand and has not used this in sometime -she had fall while visiting her daughter and had to go to the emergency room -experienced no orthopedic injuries fortunately. -felt give way weakness -asked if the increased dose of amitriptyline could do this. She hopes not as it has helped her to sleep better.  -MRI brain showed no significant abnormalities -had a high adjustable bed but it is messed up and she wants one lower.  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  12) Speech is getting worse -some days are worse than others  13) Stress from home situation -had a good mother's day and her daughter was kinder to her -she was told her mother cat has leukemia  Prior history:  She does not eat anything until lunch time. She eats a lot of strawberries and lunch. She feels she needs to lay off the bread. Her daughter bakes pizza and then it is hard for her to resist this. She eats a lot of broccoli ad stir fired vegetables. She drinks lemonade. She went to the dentist and was advised not to eat tea. If she eats a big lunch she does not eat much supper.   She takes probiotics and omeprazole.   Prior history:  Kendra Simmons is a 71 year-old woman who presents today for follow-up of her lower back pain and bilateral hip pain. Her pain is usually well controlled but she often has spasms during the day and before she sleeps at night. She has been taking Tizanidine '4mg'$  HS and this has been helping her. She has Tramadol prescribed but has been taking this sparingly and does not feel much relief from it. Once when she had additional spasms during the day she took the Tizanidine and felt very sleepy afterward She has done therapy in the past and does not want to again at this time, though she knows she needs to exercise more. She used to walk in her church, but it is closed to walking now due to the pandemic.  Since last visit I performed bilateral greater trochancteric bursa injections and her pain has decreased from 6 to 4. Her Lyrica dose has been decreased by Dr. Adele Schilder, her psychiatrist, to avoid polypharmacy, and she feels this change has increased her pain and insomnia.    During today's visit she discusses extensively about her family stressors and how they are impacting her quality of life. She has regular appointments with Dr. Adele Schilder as well that she finds very helpful.   Pain Inventory Average Pain 8 Pain Right Now 9 My pain  is dull, stabbing, and aching  In the last 24 hours, has pain interfered with the following? General activity 5 Relation with others 6 Enjoyment of life 5 What TIME of day is your pain at its worst? morning Sleep (in general) Fair  Pain is worse with: bending, standing, and some activites Pain improves with: rest and medication Relief from Meds: 5   Family History  Problem Relation Age of Onset   Cancer Mother    Diabetes Mother    Sleep apnea Father    Alcohol abuse Father    Diabetes Sister    Diabetes Maternal Uncle  Diabetes Cousin    Social History   Socioeconomic History   Marital status: Divorced    Spouse name: Not on file   Number of children: 3   Years of education: 14   Highest education level: Not on file  Occupational History   Occupation: Retired  Tobacco Use   Smoking status: Never   Smokeless tobacco: Never  Vaping Use   Vaping Use: Never used  Substance and Sexual Activity   Alcohol use: No    Alcohol/week: 0.0 standard drinks of alcohol    Comment: zero   Drug use: No    Types: Barbituates, Benzodiazepines    Comment: Denies any drug use other than benzos   Sexual activity: Never    Birth control/protection: Abstinence  Other Topics Concern   Not on file  Social History Narrative   Patient lives at home, daughter with her.    Caffeine Use:  2 cokes daily   Sister lives next door.   Social Determinants of Health   Financial Resource Strain: Not on file  Food Insecurity: Not on file  Transportation Needs: Not on file  Physical Activity: Not on file  Stress: Not on file  Social Connections: Not on file   Past Surgical History:  Procedure Laterality Date   BOTOX INJECTION N/A 10/17/2021   Procedure: BOTOX INJECTION;  Surgeon: Clarene Essex, MD;  Location: [facility];  Service: Endoscopy;  Laterality: N/A;   CESAREAN SECTION     COSMETIC SURGERY     ESOPHAGEAL MANOMETRY N/A 09/26/2016   Procedure: ESOPHAGEAL MANOMETRY (EM);   Surgeon: Clarene Essex, MD;  Location: [facility];  Service: Endoscopy;  Laterality: N/A;   ESOPHAGOGASTRODUODENOSCOPY (EGD) WITH PROPOFOL N/A 10/17/2021   Procedure: ESOPHAGOGASTRODUODENOSCOPY (EGD) WITH PROPOFOL;  Surgeon: Clarene Essex, MD;  Location: [facility];  Service: Endoscopy;  Laterality: N/A;   laproscopy     Past Medical History:  Diagnosis Date   Anxiety    Arthritis    Depression    Emotional depression 02/04/2015   Frequent falls    GERD (gastroesophageal reflux disease)    High cholesterol    Insomnia    Memory loss    Transient alteration of awareness    BP 126/82   Pulse 95   Ht '5\' 4"'$  (1.626 m)   Wt 196 lb (88.9 kg)   SpO2 97%   BMI 33.64 kg/m   Opioid Risk Score:   Fall Risk Score:  `1  Depression screen [facility] 2/9     04/24/2022   10:08 AM 08/24/2021    9:39 AM 07/04/2021    2:25 PM 11/30/2020   10:19 AM 03/09/2020    3:14 PM 09/12/2015   12:09 PM  Depression screen PHQ 2/9  Decreased Interest 1 0 3 3 0   Down, Depressed, Hopeless 1 0 3 3 0   PHQ - 2 Score 2 0 6 6 0   Altered sleeping        Tired, decreased energy        Change in appetite        Feeling bad or failure about yourself         Trouble concentrating        Moving slowly or fidgety/restless        Suicidal thoughts        PHQ-9 Score        Difficult doing work/chores           Information is confidential and restricted. Go to  Review Flowsheets to unlock data.     Review of Systems  Musculoskeletal:  Positive for myalgias and neck pain.       Shoulder pain Scapula pain   All other systems reviewed and are negative.      Objective:   Physical Exam Gen: no distress, normal appearing HEENT: oral mucosa pink and moist, NCAT Cardio: Reg rate Chest: normal effort, normal rate of breathing Abd: soft, non-distended Ext: no edema Psych: pleasant, normal affect Skin: intact Neuro: Alert and oriented Musculoskeletal: Tenderness to bilateral hip bursa.   Assessment & Plan:    Mrs. Thumma is a 71 year-old woman who presents today for follow-up of her lower back pain and bilateral greater trochanteric bursa pain syndrome.  1) Bilateral greater trochanteric pain syndrome: - Provided referral for PT to focus on stretching and strengthening of the hip abductors, myofascial release, modalities, HEP -procedure note below for bilateral steroid injections performed today -recommended applying Tiger Balm, discussed the ingredients in this -continue tizanidine, robaxin, amitriptyline, Lyrica, tylenol, and ibuprofen -recommended applying tiger balm -continue supportive pillows  -Currently worse in the right- discussed benefits of pregnancy pillow  -Received two greater trochanteric bursa injections on 2/26 and 3/03 with excellent results. By now thise results have worn off. She continues to take the [facility]- she usually takes only when she has to sweep and mop. She uses voltaren gel. She does not have to use this very often.   -Continue Robaxin. Can take up to 2 per day. Has enough Tizanidine- can take up to 3 per day- LFTs reviewed and stable, new labs recently obtained. Increased dose of Lyrica to '75mg'$  for pain and insomnia. Use proper bed.   Continue Lyrica. Provided refill 3/2.   -Discussed that it best to avoid opioids, even tramadol, for chronic pain due to the risk of tolerance and dependence.    2) Insomnia: -She has been using the Tizanidine more often prescribed to help her sleep- advised to try to use no more than three times per day.  -no benefits with trazodone.  -Increased Lyrica to '100mg'$  for pain, and this will also help with sleep.  -She plans to purchase the pregnancy  -Repeat LFTs on w/ labs with PCP.  -Decrease amitriptyline to '125mg'$  due to its side effects.  -Try to go outside near sunrise -Get exercise during the day.  -Discussed good sleep hygiene: turning off all devices an hour before bedtime.  -Chamomile tea with dinner.  -Can  consider over the counter melatonin -refilled amitriptyline '150mg'$  HS   3) Prediabetes:  -Patient states she was recently diagnosed with diabetes Type 2. I have personally reviewed her labs and provided dietary and exercise advice. She has lost 13 lbs since our visit 3 months ago! Discussed her current diet.  -Continue Trulicity to help curb her appetite.   4) Obesity: -Educated regarding health benefits of weight loss- for pain, general health, chronic disease prevention, immune health, mental health.  -188 lbs -Will monitor weight every visit.  -Consider Roobois tea daily.  -Discussed foods that can assist in weight loss: 1) leafy greens- high in fiber and nutrients 2) dark chocolate- improves metabolism (if prefer sweetened, best to sweeten with honey instead of sugar).  3) cruciferous vegetables- high in fiber and protein 4) full fat yogurt: high in healthy fat, protein, calcium, and probiotics 5) apples- high in a variety of phytochemicals 6) nuts- high in fiber and protein that increase feelings of fullness 7) grapefruit: rich in nutrients, antioxidants, and  fiber (not to be taken with anticoagulation) 8) beans- high in protein and fiber 9) salmon- has high quality protein and healthy fats 10) green tea- rich in polyphenols 11) eggs- rich in choline and vitamin D 12) tuna- high protein, boosts metabolism 13) avocado- decreases visceral abdominal fat 14) chicken (pasture raised): high in protein and iron 15) blueberries- reduce abdominal fat and cholesterol 16) whole grains- decreases calories retained during digestion, speeds metabolism 17) chia seeds- curb appetite 18) chilies- increases fat metabolism  -Discussed supplements that can be used:  1) Metatrim '400mg'$  BID 30 minutes before breakfast and dinner  2) Sphaeranthus indicus and Garcinia mangostana (combinations of these and #1 can be found in capsicum and zychrome  3) green coffee bean extract '400mg'$  twice per day or  Irvingia (african mango) 150 to '300mg'$  twice per day.   5) Osteopenia: Vitamin D supplement, calcium supplement. Weight-bearing exercise. Continue Cubie to exercise while watching television.   6) B12 deficiency: Continue supplement.   7) General health:  --Recommended use of Down Dog app to do 15 min of daily yoga with her daughter. Advised that this will help with both hip and low back pain.  Recommended eating pain relieving foods and provided with a handout.  Recommended Roobois tea for its multiple benefits.    --Discussed family stressors extensively. Her anxiety and depressed mood given her family is a large component of increased inflammation and pain. Advised that she focus on the positive relationships in her life and the things that she enjoys to reduce her stress and grief. Discussed her recent stressor regarding a cat her daughter brought in that is stressing her other cats.   -She has thought a lot about getting back to work. 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 Encouraged her to do this! I think that is a wonderful idea!  8) Cervical myofascial pain syndrome: -Continue heat -She would like to try trigger point injections in future.  -Messaged her cervical pillow she can try  9) Anxiety:  -Discussed her increased anxiety lately due to a cat family she found and is caring for. Discussed I would look out for anyone interested in adopting a cat.  -discussed stressors, positives in her life Seroquel appears to have stopped helping. Wean to '300mg'$  Discussed benefits of meditation Increase amitriptyline to '50mg'$ .  -referred to psychiatry -discussed her difficult relationship with her daughter.  -Discussed exercise and meditation as tools to decrease anxiety. -Recommended Down Dog Yoga app -Discussed spending time outdoors. -Discussed positive re-framing of anxiety.  -Discussed the following foods that have been show to reduce anxiety: 1) [place]  nuts, mushrooms, soy beans due to their high selenium content. Upper limit of toxicity of selenium is 438mg/day so no more than 3-4 bBolivianuts per day.  2) Fatty fish such as salmon, mackerel, sardines, trout, and herring- high in omega-3 fatty acids 3) Eggs- increases serotonin and dopamine 4) Pumpkin seeds- high in omega-3 fatty acids 5) dark chocolate- high in flavanols that increase blood flow to brain 6) turmeric- take with black pepper to increase absorption 7) chamomile tea- antioxidant and anti-inflammatory properties 8) yogurt without sugar- supports gut-brain axis 9) green tea- contains L- theanine 10) blueberries- high in vitamin C and antioxidants 11) [place] high in tryptophan which gets converted to serotonin 12) bell peppers- rich in vitamin C and antioxidants 13) citrus fruits- rich in vitamin C and antioxidants 14) almonds- high in vitamin E and healthy fats 15) chia  seeds- high in omega-3 fatty acids   10) Constipation:  -Provided list of following foods that help with constipation and highlighted a few: 1) prunes- contain high amounts of fiber.  2) apples- has a form of dietary fiber called pectin that accelerates stool movement and increases beneficial gut bacteria 3) pears- in addition to fiber, also high in fructose and sorbitol which have laxative effect 4) figs- contain an enzyme ficin which helps to speed colonic transit 5) kiwis- contain an enzyme actinidin that improves gut motility and reduces constipation 6) oranges- rich in pectin (like apples) 7) grapefruits- contain a flavanol naringenin which has a laxative effect 8) vegetables- rich in fiber and also great sources of folate, vitamin C, and K 9) artichoke- high in inulin, prebiotic great for the microbiome 10) chicory- increases stool frequency and softness (can be added to coffee) 11) rhubarb- laxative effect 12) sweet potato- high fiber 13) beans, peas, and lentils- contain both soluble and  insoluble fiber 14) chia seeds- improves intestinal health and gut flora 15) flaxseeds- laxative effect 16) whole grain rye bread- high in fiber 17) oat bran- high in soluble and insoluble fiber 18) kefir- softens stools -recommended to try at least one of these foods every day.  -drink 6-8 glasses of water per day -walk regularly, especially after meals.   11) Depression -discussed considering making her friend her power of attorney given her fears that her daughter does not have her best interest and may place her in a nursing home.  -encouraged her to spend time with her close friend and others who are a positive influence in her life  72) Fall -discussed that all her amitriptyline, tizanidine, seroquel, lyrica, and robaxin could contribute to confusion and falls, especially the combined effect of all of these. Decrease tizanidine to 2 tablets per night -ordered XR of lower back to assess for fracture given new onset sciatica after most recent fall -discussed use of meloxciam 7.'5mg'$  prn for pain -encouraged weaning the medications that seem to be least helpful to her -decided together to decrease tizanidine to 1 pill at a time at night -asked her to let me know if this change improves her symptoms  13) Dry mouth: -dicussed decreasing amitriptyline to '125mg'$  HS but prefers to keep 150 to help her insomnia and anxiety. . Discussed that this is likely contributor to dry mouth  14) Multiple cavities: -avoid added sugar, especially in drink form -advised neem, vegetables, fruits, nuts.   15) ?Statin-induced myalgias Decrease crestor to '10mg'$  daily.  -Provided with list of supplements that can help with dyslipidemia: 1) Vitamin B3 500-4,'000mg'$  in divided doses daily (would recommend starting low as can cause uncomfortable facial flushing if started at too high a dose) 2) Phytosterols 2.15 grams daily 3) Fermented soy 30-50 grams daily 4) EGCG (found in green tea): 500-'1000mg'$  daily 5)  Omega-3 fatty acids 3000-5,'000mg'$  daily 6) Flax seed 40 grams daily 7) Monounsaturated fats 20-40 grams daily (olives, olive oil, nuts), also reduces cardiovascular disease 8) Sesame: 40 grams daily 9) Gamma/delta tocotrienols- a family of unsaturated forms of Vitamin E- '200mg'$  with dinner 10) Pantethine '900mg'$  daily in divided doses 11) Resveratrol '250mg'$  daily 12) N Acetyl Cysteine '2000mg'$  daily in divided doses 13) Curcumin 2000-'5000mg'$  in divided doses daily 14) Pomegranate juice: 8 ounces daily, also helps to lower blood pressure 15) Pomegranate seeds one cup daily, also helps to lower blood pressure 16) Citrus Bergamot '1000mg'$  daily, also helps with glucose control and weight loss 17) Vitamin C  $'500mg'e$  daily 18) Quercetin 500-'1000mg'$  daily 19) Glutathione 20) Probiotics 60-100 billion organisms per day 21) Fiber 22) Oats 23) Aged garlic (can eat as food or supplement of 600-'900mg'$  per day) 24) Chia seeds 25 grams per day 25) Lycopene- carotenoid found in high concentrations in tomatoes. 26) Alpha linolenic acid 27) Flavonoids and anthocyanins 28) Wogonin- flavanoid that enhances reverse cholesterol transport 29) Coenzyme Q10 30) Pantethine- derivative of Vitamin B5: '300mg'$  three times per day or '450mg'$  twice per day with or without food 31) Barley and other whole grains 32) Orange juice 33) L- carnitine 34) L- Lysine 35) L- Arginine 36) Almonds 37) Morin 38) Rutin 39) Carnosine 40) Histidine  41) Kaempferol  42) Organosulfur compounds 43) Vitamin E 44) Oleic acid 45) RBO (ferulic acid gammaoryzanol) 46) grape seed extract 47) Red wine 48) Berberine HCL '500mg'$  daily or twice per day- more effective and with fewer adverse effects that ezetimibe monotherapy 49) red yeast rice 2400- 4800 mg/day 50) chlorella 51) Licorice    B/l Trochanteric bursa injection   Indication Trochanteric bursitis. Exam has tenderness over the greater trochanter of the hip. Pain has not responded to  conservative care such as exercise therapy and oral medications. Pain interferes with sleep or with mobility Informed consent was obtained after describing risks and benefits of the procedure with the patient these include bleeding bruising and infection. Patient has signed written consent form. Patient placed in a lateral decubitus position with the affected hip superior. Point of maximal pain was palpated marked and prepped with Betadine and entered with a needle to bone contact. Needle slightly withdrawn then '6mg'$  of betamethasone with 4 cc 1% lidocaine were injected. Patient tolerated procedure well. It was repeated on the other side. Post procedure instructions given.

## 2022-07-30 ENCOUNTER — Telehealth: Payer: Self-pay

## 2022-07-30 ENCOUNTER — Encounter (HOSPITAL_BASED_OUTPATIENT_CLINIC_OR_DEPARTMENT_OTHER): Payer: 59 | Admitting: Physical Medicine and Rehabilitation

## 2022-07-30 DIAGNOSIS — M7062 Trochanteric bursitis, left hip: Secondary | ICD-10-CM | POA: Diagnosis not present

## 2022-07-30 DIAGNOSIS — M25551 Pain in right hip: Secondary | ICD-10-CM

## 2022-07-30 DIAGNOSIS — R253 Fasciculation: Secondary | ICD-10-CM | POA: Diagnosis not present

## 2022-07-30 NOTE — Progress Notes (Signed)
Subjective:    Patient ID: Kendra Simmons, female    DOB: 08-26-1951, 71 y.o.   MRN: [id number]  HPI  Kendra Simmons is a 71 year old woman who presents for f/u of twitching, falls, anxiety, pain, depression, trochanteric bursitis, and insomnia.   1) Bilateral greater trochanteric pain syndrome -did not get any relief from recent steroid injections -she continues to use supportive pillows at night -she got a new mattress that she thinks is good quality, but has noticed worsening low back and hip pain since using this -she pain limites her from cleaning and doing activities around the house -she has used tylenol and ibuprofen for relief.  -she has not tried [employer] -She has been taking Robaxin- sometimes none, sometimes up 2 two pills per day and she asks if this is ok -She has been using voltaren gel -She has ordered a pillow to help offload her lateral hips- she will get this 3/10 -pain continues to be quite severe and interferes with her sleep.  -she asks if it is ok to take up to 3 Tizanidine at night.   2) Cervical myofascial pain syndrome: -muscles feel very tight near her scapula and upper back -She has been using heating pad every day.  -She notes poor posture.  -She has had injections in her upper back before.   3) Obesity: -She got down to 180 and climbed back up again to 192.  -BMI is 32.96 -She has been considering lap band surgery. She is not sure if she would be approved for this.  -She plans to walk more in the summer.  3) Prediabetes  4) HLD  5) Depression:  -She takes Wellbutrin.  -she has been following with a psychiatrist -her daughter often does not talk to her and when she does she can be very hurtful to her -she fears that one day her daughter may put her in a nursing home -she does have a really close friend of 33 years whom she is seeing the show Cats with, she was very happy on Christmas when her friend gave her these tickers -her son  used to be very loving but has been more involved with his wife and children and has not seen her for 7 years. He is her power of attorney.   6) Anxiety: she has been ruminating a lot.  -this prevents her from sleeping at night -she needs a refill of her Seroquel today -she has been having a lot more anxiety recently due to the death of her sister 1 month ago -she also has anxiety regarding the cats she is caring for at home and finding them adoptive families -she also has anxiety regarding the relationship with her daughter  32) Insomnia:  -She has been taking Seroquel and it seems to have stopped helping, but she has discussed with her psychiatrist and he would prefer for her not to decrease this medicine any further.  -she finds benefit from the amitriptyline and asks if this can be increased -this has been a problem for her as long as she can remember. -she asks if it would be ok to take up to 3 trazodone at night  -she has been taking melatonin -has ruminating thoughts at night  8) Constipation: -she has been taking colace  9) Fall -she just had a recent fall which she feels is her worst yet -she has no warning when she is going to fall -she has been having sciatica since this fall -  she would like to get repeat XR -she does not think fall was related to amitriptyline -she does have meloxicam on hand and has not used this in sometime -she had fall while visiting her daughter and had to go to the emergency room -experienced no orthopedic injuries fortunately. -felt give way weakness -asked if the increased dose of amitriptyline could do this. She hopes not as it has helped her to sleep better.  -MRI brain showed no significant abnormalities -had a high adjustable bed but it is messed up and she wants one lower.  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  12) Speech is getting worse -some days are worse than others  13) Stress from home situation -had a good mother's day and her daughter was kinder to her -she was told her mother cat has leukemia  14) Twitching -has been experiencing twitching in hands and feet in the last few days and has been dropping objects -she does not feel safe to drive -she does not want to go to the ED right now -she wants to make sure she is not having any neurological impairment -she is also feeling muscle aches and pains  Prior history:  She does not eat anything until lunch time. She eats a lot of strawberries and lunch. She feels she needs to lay off the bread. Her daughter bakes pizza and then it is hard for her to resist this. She eats a lot of broccoli ad stir fired vegetables. She drinks lemonade. She went to the dentist and was advised not to eat tea. If she eats a big lunch she does not eat much supper.   She takes probiotics and omeprazole.   Prior history:  Kendra Simmons is a 71 year-old woman who presents today for follow-up of her lower back pain and bilateral hip pain. Her pain is usually well controlled but she often has spasms during the day and before she sleeps at night. She has been taking Tizanidine '4mg'$  HS and this has been helping her. She has Tramadol prescribed but has been taking this sparingly and does not feel much relief from it. Once when she had additional spasms during the day she took the Tizanidine and felt very sleepy afterward She has done therapy in the past and does not want to again at this time, though she knows she needs to exercise more. She used to walk in her church, but it is closed to walking now due to the pandemic.  Since last visit I performed bilateral greater trochancteric bursa injections and her pain has decreased from 6 to 4. Her Lyrica dose has been decreased by Dr. Adele Schilder, her psychiatrist, to avoid polypharmacy, and she feels  this change has increased her pain and insomnia.    During today's visit she discusses extensively about her family stressors and how they are impacting her quality of life. She has regular appointments with Dr. Adele Schilder as well that she finds very helpful.   Pain Inventory Average Pain 8 Pain Right Now 9 My pain is dull, stabbing, and aching  In the last 24 hours, has pain interfered with the following? General activity 5 Relation with others 6 Enjoyment of life 5 What TIME of day is your pain at its worst? morning Sleep (in general) Fair  Pain is worse with: bending,  standing, and some activites Pain improves with: rest and medication Relief from Meds: 5   Family History  Problem Relation Age of Onset   Cancer Mother    Diabetes Mother    Sleep apnea Father    Alcohol abuse Father    Diabetes Sister    Diabetes Maternal Uncle    Diabetes Cousin    Social History   Socioeconomic History   Marital status: Divorced    Spouse name: Not on file   Number of children: 3   Years of education: 14   Highest education level: Not on file  Occupational History   Occupation: Retired  Tobacco Use   Smoking status: Never   Smokeless tobacco: Never  Scientific laboratory technician Use: Never used  Substance and Sexual Activity   Alcohol use: No    Alcohol/week: 0.0 standard drinks of alcohol    Comment: zero   Drug use: No    Types: Barbituates, Benzodiazepines    Comment: Denies any drug use other than benzos   Sexual activity: Never    Birth control/protection: Abstinence  Other Topics Concern   Not on file  Social History Narrative   Patient lives at home, daughter with her.    Caffeine Use:  2 cokes daily   Sister lives next door.   Social Determinants of Health   Financial Resource Strain: Not on file  Food Insecurity: Not on file  Transportation Needs: Not on file  Physical Activity: Not on file  Stress: Not on file  Social Connections: Not on file   Past Surgical  History:  Procedure Laterality Date   BOTOX INJECTION N/A 10/17/2021   Procedure: BOTOX INJECTION;  Surgeon: Clarene Essex, MD;  Location: [facility];  Service: Endoscopy;  Laterality: N/A;   CESAREAN SECTION     COSMETIC SURGERY     ESOPHAGEAL MANOMETRY N/A 09/26/2016   Procedure: ESOPHAGEAL MANOMETRY (EM);  Surgeon: Clarene Essex, MD;  Location: [facility];  Service: Endoscopy;  Laterality: N/A;   ESOPHAGOGASTRODUODENOSCOPY (EGD) WITH PROPOFOL N/A 10/17/2021   Procedure: ESOPHAGOGASTRODUODENOSCOPY (EGD) WITH PROPOFOL;  Surgeon: Clarene Essex, MD;  Location: [facility];  Service: Endoscopy;  Laterality: N/A;   laproscopy     Past Medical History:  Diagnosis Date   Anxiety    Arthritis    Depression    Emotional depression 02/04/2015   Frequent falls    GERD (gastroesophageal reflux disease)    High cholesterol    Insomnia    Memory loss    Transient alteration of awareness    There were no vitals taken for this visit.  Opioid Risk Score:   Fall Risk Score:  `1  Depression screen [facility] 2/9     04/24/2022   10:08 AM 08/24/2021    9:39 AM 07/04/2021    2:25 PM 11/30/2020   10:19 AM 03/09/2020    3:14 PM 09/12/2015   12:09 PM  Depression screen PHQ 2/9  Decreased Interest 1 0 3 3 0   Down, Depressed, Hopeless 1 0 3 3 0   PHQ - 2 Score 2 0 6 6 0   Altered sleeping        Tired, decreased energy        Change in appetite        Feeling bad or failure about yourself         Trouble concentrating        Moving slowly or fidgety/restless        Suicidal  thoughts        PHQ-9 Score        Difficult doing work/chores           Information is confidential and restricted. Go to Review Flowsheets to unlock data.     Review of Systems  Musculoskeletal:  Positive for myalgias and neck pain.       Shoulder pain Scapula pain   All other systems reviewed and are negative.      Objective:   Physical Exam Not performed  Assessment & Plan:   Kendra Simmons is a 71 year-old  woman who presents today for follow-up of her lower back pain and bilateral greater trochanteric bursa pain syndrome.  1) Bilateral greater trochanteric pain syndrome: -discussed lack of relief from prior steroids and potential side effects from steroids, recommend not repeating steroid injections - Provided referral for PT to focus on stretching and strengthening of the hip abductors, myofascial release, modalities, HEP -procedure note below for bilateral steroid injections performed today -recommended applying Tiger Balm, discussed the ingredients in this -continue tizanidine, robaxin, amitriptyline, Lyrica, tylenol, and ibuprofen -recommended applying tiger balm -continue supportive pillows  -Currently worse in the right- discussed benefits of pregnancy pillow  -Received two greater trochanteric bursa injections on 2/26 and 3/03 with excellent results but results have waned to inefficacy over time.  She continues to take the [facility]- she usually takes only when she has to sweep and mop. She uses voltaren gel. She does not have to use this very often.   -Continue Robaxin. Can take up to 2 per day. Has enough Tizanidine- can take up to 3 per day- LFTs reviewed and stable, new labs recently obtained. Increased dose of Lyrica to '75mg'$  for pain and insomnia. Use proper bed.   Continue Lyrica. Provided refill 3/2.   -Discussed that it best to avoid opioids, even tramadol, for chronic pain due to the risk of tolerance and dependence.    2) Insomnia: -She has been using the Tizanidine more often prescribed to help her sleep- advised to try to use no more than three times per day.  -no benefits with trazodone.  -Increased Lyrica to '100mg'$  for pain, and this will also help with sleep.  -She plans to purchase the pregnancy  -Repeat LFTs on w/ labs with PCP.  -Decrease amitriptyline to '125mg'$  due to its side effects.  -Try to go outside near sunrise -Get exercise during the day.  -Discussed  good sleep hygiene: turning off all devices an hour before bedtime.  -Chamomile tea with dinner.  -Can consider over the counter melatonin -refilled amitriptyline '150mg'$  HS   3) Prediabetes:  -Patient states she was recently diagnosed with diabetes Type 2. I have personally reviewed her labs and provided dietary and exercise advice. She has lost 13 lbs since our visit 3 months ago! Discussed her current diet.  -Continue Trulicity to help curb her appetite.   4) Obesity: -Educated regarding health benefits of weight loss- for pain, general health, chronic disease prevention, immune health, mental health.  -188 lbs -Will monitor weight every visit.  -Consider Roobois tea daily.  -Discussed foods that can assist in weight loss: 1) leafy greens- high in fiber and nutrients 2) dark chocolate- improves metabolism (if prefer sweetened, best to sweeten with honey instead of sugar).  3) cruciferous vegetables- high in fiber and protein 4) full fat yogurt: high in healthy fat, protein, calcium, and probiotics 5) apples- high in a variety of phytochemicals 6) nuts- high in fiber  and protein that increase feelings of fullness 7) grapefruit: rich in nutrients, antioxidants, and fiber (not to be taken with anticoagulation) 8) beans- high in protein and fiber 9) salmon- has high quality protein and healthy fats 10) green tea- rich in polyphenols 11) eggs- rich in choline and vitamin D 12) tuna- high protein, boosts metabolism 13) avocado- decreases visceral abdominal fat 14) chicken (pasture raised): high in protein and iron 15) blueberries- reduce abdominal fat and cholesterol 16) whole grains- decreases calories retained during digestion, speeds metabolism 17) chia seeds- curb appetite 18) chilies- increases fat metabolism  -Discussed supplements that can be used:  1) Metatrim '400mg'$  BID 30 minutes before breakfast and dinner  2) Sphaeranthus indicus and Garcinia mangostana (combinations of these  and #1 can be found in capsicum and zychrome  3) green coffee bean extract '400mg'$  twice per day or Irvingia (african mango) 150 to '300mg'$  twice per day.   5) Osteopenia: Vitamin D supplement, calcium supplement. Weight-bearing exercise. Continue Cubie to exercise while watching television.   6) B12 deficiency: Continue supplement.   7) General health:  --Recommended use of Down Dog app to do 15 min of daily yoga with her daughter. Advised that this will help with both hip and low back pain.  Recommended eating pain relieving foods and provided with a handout.  Recommended Roobois tea for its multiple benefits.    --Discussed family stressors extensively. Her anxiety and depressed mood given her family is a large component of increased inflammation and pain. Advised that she focus on the positive relationships in her life and the things that she enjoys to reduce her stress and grief. Discussed her recent stressor regarding a cat her daughter brought in that is stressing her other cats.   -She has thought a lot about getting back to work. 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 Encouraged her to do this! I think that is a wonderful idea!  8) Cervical myofascial pain syndrome: -Continue heat -She would like to try trigger point injections in future.  -Messaged her cervical pillow she can try  9) Anxiety:  -Discussed her increased anxiety lately due to a cat family she found and is caring for. Discussed I would look out for anyone interested in adopting a cat.  -discussed stressors, positives in her life Seroquel appears to have stopped helping. Wean to '300mg'$  Discussed benefits of meditation Increase amitriptyline to '50mg'$ .  -referred to psychiatry -discussed her difficult relationship with her daughter.  -Discussed exercise and meditation as tools to decrease anxiety. -Recommended Down Dog Yoga app -Discussed spending time outdoors. -Discussed positive  re-framing of anxiety.  -Discussed the following foods that have been show to reduce anxiety: 1) [place] nuts, mushrooms, soy beans due to their high selenium content. Upper limit of toxicity of selenium is 460mg/day so no more than 3-4 bBolivianuts per day.  2) Fatty fish such as salmon, mackerel, sardines, trout, and herring- high in omega-3 fatty acids 3) Eggs- increases serotonin and dopamine 4) Pumpkin seeds- high in omega-3 fatty acids 5) dark chocolate- high in flavanols that increase blood flow to brain 6) turmeric- take with black pepper to increase absorption 7) chamomile tea- antioxidant and anti-inflammatory properties 8) yogurt without sugar- supports gut-brain axis 9) green tea- contains L- theanine 10) blueberries- high in vitamin C and antioxidants 11) [place] high in tryptophan which gets converted to serotonin 12) bell peppers- rich in vitamin C and antioxidants 13) citrus fruits- rich in vitamin  C and antioxidants 14) almonds- high in vitamin E and healthy fats 15) chia seeds- high in omega-3 fatty acids   10) Constipation:  -Provided list of following foods that help with constipation and highlighted a few: 1) prunes- contain high amounts of fiber.  2) apples- has a form of dietary fiber called pectin that accelerates stool movement and increases beneficial gut bacteria 3) pears- in addition to fiber, also high in fructose and sorbitol which have laxative effect 4) figs- contain an enzyme ficin which helps to speed colonic transit 5) kiwis- contain an enzyme actinidin that improves gut motility and reduces constipation 6) oranges- rich in pectin (like apples) 7) grapefruits- contain a flavanol naringenin which has a laxative effect 8) vegetables- rich in fiber and also great sources of folate, vitamin C, and K 9) artichoke- high in inulin, prebiotic great for the microbiome 10) chicory- increases stool frequency and softness (can be added to coffee) 11) rhubarb-  laxative effect 12) sweet potato- high fiber 13) beans, peas, and lentils- contain both soluble and insoluble fiber 14) chia seeds- improves intestinal health and gut flora 15) flaxseeds- laxative effect 16) whole grain rye bread- high in fiber 17) oat bran- high in soluble and insoluble fiber 18) kefir- softens stools -recommended to try at least one of these foods every day.  -drink 6-8 glasses of water per day -walk regularly, especially after meals.   11) Depression -discussed considering making her friend her power of attorney given her fears that her daughter does not have her best interest and may place her in a nursing home.  -encouraged her to spend time with her close friend and others who are a positive influence in her life  57) Fall -discussed that all her amitriptyline, tizanidine, seroquel, lyrica, and robaxin could contribute to confusion and falls, especially the combined effect of all of these. Decrease tizanidine to 2 tablets per night -ordered XR of lower back to assess for fracture given new onset sciatica after most recent fall -discussed use of meloxciam 7.'5mg'$  prn for pain -encouraged weaning the medications that seem to be least helpful to her -decided together to decrease tizanidine to 1 pill at a time at night -asked her to let me know if this change improves her symptoms  13) Dry mouth: -dicussed decreasing amitriptyline to '125mg'$  HS but prefers to keep 150 to help her insomnia and anxiety. . Discussed that this is likely contributor to dry mouth  14) Multiple cavities: -avoid added sugar, especially in drink form -advised neem, vegetables, fruits, nuts.   15) ?Statin-induced myalgias Decrease crestor to '10mg'$  daily.  -Provided with list of supplements that can help with dyslipidemia: 1) Vitamin B3 500-4,'000mg'$  in divided doses daily (would recommend starting low as can cause uncomfortable facial flushing if started at too high a dose) 2) Phytosterols 2.15  grams daily 3) Fermented soy 30-50 grams daily 4) EGCG (found in green tea): 500-'1000mg'$  daily 5) Omega-3 fatty acids 3000-5,'000mg'$  daily 6) Flax seed 40 grams daily 7) Monounsaturated fats 20-40 grams daily (olives, olive oil, nuts), also reduces cardiovascular disease 8) Sesame: 40 grams daily 9) Gamma/delta tocotrienols- a family of unsaturated forms of Vitamin E- '200mg'$  with dinner 10) Pantethine '900mg'$  daily in divided doses 11) Resveratrol '250mg'$  daily 12) N Acetyl Cysteine '2000mg'$  daily in divided doses 13) Curcumin 2000-'5000mg'$  in divided doses daily 14) Pomegranate juice: 8 ounces daily, also helps to lower blood pressure 15) Pomegranate seeds one cup daily, also helps to lower blood pressure 16) Citrus  Bergamot '1000mg'$  daily, also helps with glucose control and weight loss 17) Vitamin C '500mg'$  daily 18) Quercetin 500-'1000mg'$  daily 19) Glutathione 20) Probiotics 60-100 billion organisms per day 21) Fiber 22) Oats 23) Aged garlic (can eat as food or supplement of 600-'900mg'$  per day) 24) Chia seeds 25 grams per day 25) Lycopene- carotenoid found in high concentrations in tomatoes. 26) Alpha linolenic acid 27) Flavonoids and anthocyanins 28) Wogonin- flavanoid that enhances reverse cholesterol transport 29) Coenzyme Q10 30) Pantethine- derivative of Vitamin B5: '300mg'$  three times per day or '450mg'$  twice per day with or without food 31) Barley and other whole grains 32) Orange juice 33) L- carnitine 34) L- Lysine 35) L- Arginine 36) Almonds 37) Morin 38) Rutin 39) Carnosine 40) Histidine  41) Kaempferol  42) Organosulfur compounds 43) Vitamin E 44) Oleic acid 45) RBO (ferulic acid gammaoryzanol) 46) grape seed extract 47) Red wine 48) Berberine HCL '500mg'$  daily or twice per day- more effective and with fewer adverse effects that ezetimibe monotherapy 49) red yeast rice 2400- 4800 mg/day 50) chlorella 51) Licorice    16) Twitching -given proximity to recent steroid  injections, discussed could be a side effect of steroids, and could improve as steroids leave system, but recommend ED eval given dropping objects and her history of neck pain for cervical spine imaging to r/o neural compression. She does not want to go to the ED right now, so advised her to continue to keep me updated on her status  12 minutes spent in discussion of her twitching, it being a possible side effect to her recent steroid injections, but given that she is dropping objects and has a history of neck pain, recommended she go to ED for evaluation for cervical spinal stenosis/myelopathy

## 2022-07-30 NOTE — Telephone Encounter (Signed)
Patient called and stated after her injection on last week, she has been twitching all over and she keeps dropping things. She would like Dr. Ranell Patrick to call her to see what can be done.

## 2022-07-31 ENCOUNTER — Encounter (HOSPITAL_BASED_OUTPATIENT_CLINIC_OR_DEPARTMENT_OTHER): Payer: 59 | Admitting: Physical Medicine and Rehabilitation

## 2022-07-31 DIAGNOSIS — W19XXXS Unspecified fall, sequela: Secondary | ICD-10-CM

## 2022-07-31 DIAGNOSIS — R29898 Other symptoms and signs involving the musculoskeletal system: Secondary | ICD-10-CM | POA: Diagnosis not present

## 2022-07-31 MED ORDER — QUETIAPINE FUMARATE 200 MG PO TABS: 200.0000 mg | ORAL_TABLET | Freq: Every day | ORAL | 3 refills | Status: DC

## 2022-07-31 MED ORDER — QUETIAPINE FUMARATE 50 MG PO TABS: 50.0000 mg | ORAL_TABLET | Freq: Every day | ORAL | 3 refills | Status: DC
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
## 2022-07-31 NOTE — Addendum Note (Signed)
Addended by: Izora Ribas on: 07/31/2022 11:47 AM   Modules accepted: Orders

## 2022-08-01 ENCOUNTER — Encounter: Payer: 59 | Admitting: Physical Medicine and Rehabilitation

## 2022-08-01 ENCOUNTER — Telehealth: Payer: Self-pay | Admitting: Physical Medicine and Rehabilitation
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
## 2022-08-02 ENCOUNTER — Emergency Department (HOSPITAL_COMMUNITY): Payer: 59

## 2022-08-02 ENCOUNTER — Emergency Department (HOSPITAL_COMMUNITY)
Admission: EM | Admit: 2022-08-02 | Discharge: 2022-08-02 | Disposition: A | Discharge status: Home / Self Care | Payer: 59 | Attending: Emergency Medicine | Admitting: Emergency Medicine

## 2022-08-02 ENCOUNTER — Other Ambulatory Visit: Payer: Self-pay

## 2022-08-02 DIAGNOSIS — R55 Syncope and collapse: Secondary | ICD-10-CM | POA: Diagnosis not present

## 2022-08-02 DIAGNOSIS — Y92009 Unspecified place in unspecified non-institutional (private) residence as the place of occurrence of the external cause: Secondary | ICD-10-CM | POA: Insufficient documentation

## 2022-08-02 DIAGNOSIS — W1839XA Other fall on same level, initial encounter: Secondary | ICD-10-CM | POA: Insufficient documentation

## 2022-08-02 DIAGNOSIS — S0990XA Unspecified injury of head, initial encounter: Secondary | ICD-10-CM | POA: Insufficient documentation

## 2022-08-02 DIAGNOSIS — D72829 Elevated white blood cell count, unspecified: Secondary | ICD-10-CM | POA: Diagnosis not present

## 2022-08-02 DIAGNOSIS — E860 Dehydration: Secondary | ICD-10-CM | POA: Diagnosis not present

## 2022-08-02 LAB — COMPREHENSIVE METABOLIC PANEL
ALT: 26 U/L (ref 0–44)
AST: 32 U/L (ref 15–41)
Albumin: 3.9 g/dL (ref 3.5–5.0)
Alkaline Phosphatase: 65 U/L (ref 38–126)
Anion gap: 10 (ref 5–15)
BUN: 37 mg/dL — ABNORMAL HIGH (ref 8–23)
CO2: 20 mmol/L — ABNORMAL LOW (ref 22–32)
Calcium: 9 mg/dL (ref 8.9–10.3)
Chloride: 110 mmol/L (ref 98–111)
Creatinine, Ser: 1.57 mg/dL — ABNORMAL HIGH (ref 0.44–1.00)
GFR, Estimated: 35 mL/min — ABNORMAL LOW (ref >60)
Glucose, Bld: 197 mg/dL — ABNORMAL HIGH (ref 70–99)
Potassium: 3.8 mmol/L (ref 3.5–5.1)
Sodium: 140 mmol/L (ref 135–145)
Total Bilirubin: 0.7 mg/dL (ref 0.3–1.2)
Total Protein: 6.5 g/dL (ref 6.5–8.1)

## 2022-08-02 LAB — URINALYSIS, ROUTINE W REFLEX MICROSCOPIC
Bacteria, UA: NONE SEEN
Bilirubin Urine: NEGATIVE
Glucose, UA: NEGATIVE mg/dL
Hgb urine dipstick: NEGATIVE
Ketones, ur: NEGATIVE mg/dL
Nitrite: NEGATIVE
Protein, ur: NEGATIVE mg/dL
Specific Gravity, Urine: 1.026 (ref 1.005–1.030)
pH: 5 (ref 5.0–8.0)

## 2022-08-02 LAB — CBC WITH DIFFERENTIAL/PLATELET
Abs Immature Granulocytes: 0.11 10*3/uL — ABNORMAL HIGH (ref 0.00–0.07)
Basophils Absolute: 0 10*3/uL (ref 0.0–0.1)
Basophils Relative: 0 %
Eosinophils Absolute: 0.1 10*3/uL (ref 0.0–0.5)
Eosinophils Relative: 1 %
HCT: 33.9 % — ABNORMAL LOW (ref 36.0–46.0)
Hemoglobin: 11.1 g/dL — ABNORMAL LOW (ref 12.0–15.0)
Immature Granulocytes: 1 %
Lymphocytes Relative: 5 %
Lymphs Abs: 0.7 10*3/uL (ref 0.7–4.0)
MCH: 32.6 pg (ref 26.0–34.0)
MCHC: 32.7 g/dL (ref 30.0–36.0)
MCV: 99.7 fL (ref 80.0–100.0)
Monocytes Absolute: 0.8 10*3/uL (ref 0.1–1.0)
Monocytes Relative: 7 %
Neutro Abs: 10.9 10*3/uL — ABNORMAL HIGH (ref 1.7–7.7)
Neutrophils Relative %: 86 %
Platelets: 114 10*3/uL — ABNORMAL LOW (ref 150–400)
RBC: 3.4 MIL/uL — ABNORMAL LOW (ref 3.87–5.11)
RDW: 13.2 % (ref 11.5–15.5)
WBC: 12.6 10*3/uL — ABNORMAL HIGH (ref 4.0–10.5)
nRBC: 0 % (ref 0.0–0.2)

## 2022-08-02 LAB — TROPONIN I (HIGH SENSITIVITY)
Troponin I (High Sensitivity): 24 ng/L — ABNORMAL HIGH (ref <18)
Troponin I (High Sensitivity): 38 ng/L — ABNORMAL HIGH (ref <18)

## 2022-08-02 LAB — LACTIC ACID, PLASMA: Lactic Acid, Venous: 1.9 mmol/L (ref 0.5–1.9)

## 2022-08-02 MED ORDER — LACTATED RINGERS IV BOLUS
1000.0000 mL | Freq: Once | INTRAVENOUS | Status: AC
  Administered 2022-08-02: 1000 mL via INTRAVENOUS

## 2022-08-02 MED ORDER — ACETAMINOPHEN 500 MG PO TABS
1000.0000 mg | ORAL_TABLET | Freq: Once | ORAL | Status: AC
  Administered 2022-08-02: 1000 mg via ORAL
  Filled 2022-08-02: qty 2

## 2022-08-02 NOTE — ED Notes (Signed)
Patient was able to ambulate to restroom without a problem, steady gait. Pt states "I walk better after the fluids I got and coke.".
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
## 2022-08-02 NOTE — ED Notes (Signed)
ED Provider at bedside. 

## 2022-08-02 NOTE — ED Notes (Signed)
Patient transported to CT 

## 2022-08-02 NOTE — ED Notes (Signed)
Pt's daughter Clarene Critchley called, states she will come pick pt up. Pt notified of same. Pt verbalizes understanding of discharge instructions. Opportunity for questioning and answers were provided. Pt discharged to lobby to wait for daughter/ride.
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
## 2022-08-04 ENCOUNTER — Inpatient Hospital Stay: Admission: RE | Admit: 2022-08-04 | Payer: 59 | Source: Ambulatory Visit

## 2022-08-09 ENCOUNTER — Ambulatory Visit: Payer: 59 | Admitting: Physical Medicine and Rehabilitation

## 2022-08-13 ENCOUNTER — Ambulatory Visit: Payer: 59 | Admitting: Psychology

## 2022-08-21 ENCOUNTER — Telehealth: Payer: Self-pay
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
## 2022-09-04 ENCOUNTER — Other Ambulatory Visit: Payer: Self-pay | Admitting: Physical Medicine and Rehabilitation

## 2022-09-11 ENCOUNTER — Emergency Department (HOSPITAL_COMMUNITY)
Admission: EM | Admit: 2022-09-11 | Discharge: 2022-09-11 | Disposition: A | Discharge status: Home / Self Care | Payer: 59 | Attending: Emergency Medicine | Admitting: Emergency Medicine

## 2022-09-11 ENCOUNTER — Encounter (HOSPITAL_COMMUNITY): Payer: Self-pay | Admitting: Emergency Medicine

## 2022-09-11 ENCOUNTER — Emergency Department (HOSPITAL_COMMUNITY): Payer: 59

## 2022-09-11 ENCOUNTER — Other Ambulatory Visit: Payer: Self-pay

## 2022-09-11 ENCOUNTER — Other Ambulatory Visit: Payer: Self-pay | Admitting: Physical Medicine and Rehabilitation

## 2022-09-11 DIAGNOSIS — S8011XA Contusion of right lower leg, initial encounter: Secondary | ICD-10-CM | POA: Diagnosis not present

## 2022-09-11 DIAGNOSIS — W19XXXA Unspecified fall, initial encounter: Secondary | ICD-10-CM

## 2022-09-11 DIAGNOSIS — R930 Abnormal findings on diagnostic imaging of skull and head, not elsewhere classified: Secondary | ICD-10-CM | POA: Insufficient documentation

## 2022-09-11 DIAGNOSIS — Y92009 Unspecified place in unspecified non-institutional (private) residence as the place of occurrence of the external cause: Secondary | ICD-10-CM | POA: Insufficient documentation

## 2022-09-11 DIAGNOSIS — W010XXA Fall on same level from slipping, tripping and stumbling without subsequent striking against object, initial encounter: Secondary | ICD-10-CM | POA: Diagnosis not present

## 2022-09-11 DIAGNOSIS — S8991XA Unspecified injury of right lower leg, initial encounter: Secondary | ICD-10-CM | POA: Diagnosis present

## 2022-09-11 LAB — CBC WITH DIFFERENTIAL/PLATELET
Abs Immature Granulocytes: 0.02 10*3/uL (ref 0.00–0.07)
Basophils Absolute: 0 10*3/uL (ref 0.0–0.1)
Basophils Relative: 0 %
Eosinophils Absolute: 0.1 10*3/uL (ref 0.0–0.5)
Eosinophils Relative: 3 %
HCT: 39 % (ref 36.0–46.0)
Hemoglobin: 12.4 g/dL (ref 12.0–15.0)
Immature Granulocytes: 1 %
Lymphocytes Relative: 32 %
Lymphs Abs: 1.4 10*3/uL (ref 0.7–4.0)
MCH: 31.7 pg (ref 26.0–34.0)
MCHC: 31.8 g/dL (ref 30.0–36.0)
MCV: 99.7 fL (ref 80.0–100.0)
Monocytes Absolute: 0.4 10*3/uL (ref 0.1–1.0)
Monocytes Relative: 8 %
Neutro Abs: 2.4 10*3/uL (ref 1.7–7.7)
Neutrophils Relative %: 56 %
Platelets: 147 10*3/uL — ABNORMAL LOW (ref 150–400)
RBC: 3.91 MIL/uL (ref 3.87–5.11)
RDW: 12.6 % (ref 11.5–15.5)
WBC: 4.3 10*3/uL (ref 4.0–10.5)
nRBC: 0 % (ref 0.0–0.2)

## 2022-09-11 LAB — BASIC METABOLIC PANEL
Anion gap: 8 (ref 5–15)
BUN: 17 mg/dL (ref 8–23)
CO2: 25 mmol/L (ref 22–32)
Calcium: 9.5 mg/dL (ref 8.9–10.3)
Chloride: 108 mmol/L (ref 98–111)
Creatinine, Ser: 0.93 mg/dL (ref 0.44–1.00)
GFR, Estimated: >60 mL/min (ref >60)
Glucose, Bld: 109 mg/dL — ABNORMAL HIGH (ref 70–99)
Potassium: 4 mmol/L (ref 3.5–5.1)
Sodium: 141 mmol/L (ref 135–145)

## 2022-09-11 LAB — TSH: TSH: 1.161 u[IU]/mL (ref 0.350–4.500)

## 2022-09-11 LAB — TROPONIN I (HIGH SENSITIVITY)
Troponin I (High Sensitivity): 3 ng/L (ref <18)
Troponin I (High Sensitivity): 3 ng/L (ref <18)

## 2022-09-11 LAB — MAGNESIUM: Magnesium: 1.9 mg/dL (ref 1.7–2.4)

## 2022-09-11 NOTE — ED Triage Notes (Addendum)
Pt arrives via EMS. Had a fall at home, did not hit head. No blood thinners. Pt has been tripping/falling a lot recently. Pt states she has had episodes of dizziness. States this has been going on for a "long time" and sees a neurologist for it. BP 142/90, HR 88 97%  Pt is alert/oriented. Pt complains of lower back pain

## 2022-09-11 NOTE — Progress Notes (Signed)
This encounter was created in error - please disregard.

## 2022-09-11 NOTE — Progress Notes (Signed)
TOC CSW spoke with pts son, Avriana Joo [phone].  Molli Barrows is in agreeance with pt receiving HH PT and OT.  Mora Pedraza Tarpley-Carter, MSW, LCSW-A Pronouns:  She/Her/Hers [facility] HealthTransitions of Care Clinical Social Worker Direct Number:  [email or web address]
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
## 2022-09-11 NOTE — ED Provider Notes (Signed)
[facility] EMERGENCY DEPARTMENT Provider Note   CSN: [id number] Arrival date & time: 09/11/22  1259     History  Chief Complaint  Patient presents with   Kendra Simmons Kendra Simmons is a 71 y.o. female.  HPI 71 year old female with a history of chronic falls/balance issues presents after multiple falls, including this morning.  She states she tripped and fell backwards, striking her head.  She also has chronic hip/leg pain.  She has multiple bruises to her lower extremities from these falls.  She has been ambulatory otherwise and does not use any devices such as a cane or walker.  She denies any recent illness, headaches, weakness/numbness.  These are not new problems, she was just concerned about the fall today.  She states her son also wants to talk to the physician about getting help in her home.  She is not interested in any type of living facility.  Home Medications Prior to Admission medications   Medication Sig Start Date End Date Taking? Authorizing Provider  acetaminophen (TYLENOL) 650 MG CR tablet Take 650 mg by mouth every 8 (eight) hours as needed (Arthritis).    [provider]  amitriptyline (ELAVIL) 150 MG tablet Take 1 tablet (150 mg total) by mouth at bedtime. 07/24/22   Raulkar, Clide Deutscher, MD  buPROPion (WELLBUTRIN XL) 150 MG 24 hr tablet TAKE 1 TABLET BY MOUTH EVERY DAY IN THE MORNING 06/25/22   Arfeen, Arlyce Harman, MD  Calcium Citrate-Vitamin D (CALCIUM CITRATE + D3 PO) Take 1 tablet by mouth daily.    [provider]  diclofenac Sodium (VOLTAREN) 1 % GEL APPLY 2 GRAMS TO AFFECTED AREA 4 TIMES A DAY 06/25/22   Mcarthur Rossetti, MD  diphenhydramine-acetaminophen (TYLENOL PM) 25-500 MG TABS tablet Take 2 tablets by mouth at bedtime.    [provider]  Dulaglutide (TRULICITY) 9.47 ML/4.6TK SOPN Inject 0.75 mg into the skin once a week.    [provider]  gentamicin (GARAMYCIN) 0.3 % ophthalmic solution 1 drop in  each eye 11/03/21   [provider]  hyoscyamine (ANASPAZ) 0.125 MG TBDP disintergrating tablet Place 0.125 mg under the tongue daily as needed (Choking eposide).    [provider]  lidocaine (LIDODERM) 5 % Place 1 patch onto the skin daily. Remove & Discard patch within 12 hours or as directed by MD 02/15/22   Ranell Patrick, Clide Deutscher, MD  lisinopril (ZESTRIL) 2.5 MG tablet Take 2.5 mg by mouth daily. 01/24/21   [provider]  meloxicam (MOBIC) 7.5 MG tablet TAKE 1 TABLET BY MOUTH 2 TIMES DAILY AS NEEDED FOR PAIN. 06/13/22   Raulkar, Clide Deutscher, MD  memantine (NAMENDA) 10 MG tablet Take 1 tablet (10 mg total) by mouth 2 (two) times daily. 06/02/15   Rankin, Shuvon B, NP  methocarbamol (ROBAXIN) 500 MG tablet TAKE 1 TABLET BY MOUTH EVERY 6 HOURS AS NEEDED FOR MUSCLE SPASMS. 09/04/22   Raulkar, Clide Deutscher, MD  Multiple Vitamins-Minerals (MULTIVITAMIN PO) Take 1 tablet by mouth daily. Centrum 50 +    [provider]  nystatin (MYCOSTATIN/NYSTOP) powder Apply topically 2 (two) times daily as needed. 12/06/21   [provider]  Omega-3 Fatty Acids (OMEGA-3 FISH OIL PO) Take 1,000 mg by mouth daily. MIni    [provider]  omeprazole (PRILOSEC) 40 MG capsule Take 40 mg by mouth daily.  09/06/16   [provider]  ondansetron (ZOFRAN ODT) 4 MG disintegrating tablet Take 1 tablet (4  mg total) by mouth every 8 (eight) hours as needed for nausea or vomiting. 02/24/20   Joy, Shawn C, PA-C  polycarbophil (FIBERCON) 625 MG tablet Take 625 mg by mouth daily. gummy    [provider]  polyethylene glycol (MIRALAX / GLYCOLAX) 17 g packet Take 17 g by mouth every other day.    [provider]  pregabalin (LYRICA) 100 MG capsule Take 1 capsule (100 mg total) by mouth at bedtime. 05/17/22   Raulkar, Clide Deutscher, MD  Probiotic Product (PROBIOTIC PO) Take 1 tablet by mouth daily. gummy    [provider]  QUEtiapine (SEROQUEL) 200 MG tablet Take 1  tablet (200 mg total) by mouth at bedtime. 07/31/22   Raulkar, Clide Deutscher, MD  QUEtiapine (SEROQUEL) 50 MG tablet Take 1 tablet (50 mg total) by mouth at bedtime. 07/31/22   Raulkar, Clide Deutscher, MD  rosuvastatin (CRESTOR) 10 MG tablet TAKE 1 TABLET BY MOUTH EVERY DAY 05/15/22   Raulkar, Clide Deutscher, MD  sucralfate (CARAFATE) 1 GM/10ML suspension Take 10 mLs (1 g total) by mouth 4 (four) times daily -  with meals and at bedtime. Patient taking differently: Take 1 g by mouth daily as needed (stomach). 12/11/18   Black, Lezlie Octave, NP  tamsulosin (FLOMAX) 0.4 MG CAPS capsule Take 1 capsule (0.4 mg total) by mouth daily after supper. For frequent urination/urgency 06/02/15   Rankin, Shuvon B, NP  tiZANidine (ZANAFLEX) 4 MG tablet TAKE 1 TABLET BY MOUTH 3 TIMES DAILY. 09/11/22   Raulkar, Clide Deutscher, MD  TRULICITY 3 VV/6.1YW SOPN Inject 0.5 mg into the skin once a week. 03/17/21   [provider]      Allergies    Phentermine and Augmentin [amoxicillin-pot clavulanate]    Review of Systems   Review of Systems  Constitutional:  Negative for fever.  Cardiovascular:  Negative for chest pain.  Musculoskeletal:  Negative for back pain and neck pain.  Neurological:  Negative for weakness, numbness and headaches.    Physical Exam Updated Vital Signs BP (!) 152/84   Pulse 78   Temp 97.6 F (36.4 C) (Oral)   Resp 16   SpO2 100%  Physical Exam Vitals and nursing note reviewed.  Constitutional:      General: She is not in acute distress.    Appearance: She is well-developed. She is not ill-appearing.  HENT:     Head: Normocephalic and atraumatic.  Eyes:     Extraocular Movements: Extraocular movements intact.     Pupils: Pupils are equal, round, and reactive to light.  Cardiovascular:     Rate and Rhythm: Normal rate and regular rhythm.     Heart sounds: Normal heart sounds.  Pulmonary:     Effort: Pulmonary effort is normal.     Breath sounds: Normal breath sounds.  Abdominal:     Palpations:  Abdomen is soft.     Tenderness: There is no abdominal tenderness.  Musculoskeletal:     Comments: There is some bruising to the right shin.  No bony tenderness.  No difficulty ambulating. No tenderness to the C/T/L-spine and no decreased range of motion or pain with range of motion to either hip.  Skin:    General: Skin is warm and dry.  Neurological:     Mental Status: She is alert.     Comments: CN 3-12 grossly intact. 5/5 strength in all 4 extremities. Grossly normal sensation. Normal finger to nose. Patient is able to ambulate in the room without difficulty  ED Results / Procedures / Treatments   Labs (all labs ordered are listed, but only abnormal results are displayed) Labs Reviewed  CBC WITH DIFFERENTIAL/PLATELET - Abnormal; Notable for the following components:      Result Value   Platelets 147 (*)    All other components within normal limits  BASIC METABOLIC PANEL - Abnormal; Notable for the following components:   Glucose, Bld 109 (*)    All other components within normal limits  MAGNESIUM  TSH  TROPONIN I (HIGH SENSITIVITY)  TROPONIN I (HIGH SENSITIVITY)    EKG EKG Interpretation  Date/Time:  Tuesday September 11 2022 17:27:32 EDT Ventricular Rate:  81 PR Interval:  166 QRS Duration: 100 QT Interval:  395 QTC Calculation: 459 R Axis:   -20 Text Interpretation: Sinus rhythm Abnormal R-wave progression, early transition Left ventricular hypertrophy Anterior Q waves, possibly due to LVH Confirmed by Sherwood Gambler [phone]) on 09/11/2022 5:40:07 PM  Radiology CT Head Wo Contrast  Result Date: 09/11/2022 CLINICAL DATA:  Head trauma, moderate-severe fall EXAM: CT HEAD WITHOUT CONTRAST TECHNIQUE: Contiguous axial images were obtained from the base of the skull through the vertex without intravenous contrast. RADIATION DOSE REDUCTION: This exam was performed according to the departmental dose-optimization program which includes automated exposure control, adjustment  of the mA and/or kV according to patient size and/or use of iterative reconstruction technique. COMPARISON:  CT head 08/02/2022. FINDINGS: Brain: No evidence of acute infarction, hemorrhage, hydrocephalus, extra-axial collection or mass lesion/mass effect. Vascular: No hyperdense vessel identified. Skull: No acute fracture. Sinuses/Orbits: Right frontal and anterior ethmoidal partial opacification. No acute orbital findings. Other: No mastoid effusions. IMPRESSION: No evidence of acute intracranial abnormality. Electronically Signed   By: Margaretha Sheffield M.D.   On: 09/11/2022 15:13   DG Hip Unilat W or Wo Pelvis 2-3 Views Right  Result Date: 09/11/2022 CLINICAL DATA:  Right hip pain after multiple falls. EXAM: DG HIP (WITH OR WITHOUT PELVIS) 2-3V RIGHT COMPARISON:  January 27, 2020. FINDINGS: There is no evidence of hip fracture or dislocation. There is no evidence of arthropathy or other focal bone abnormality. IMPRESSION: Negative. Electronically Signed   By: Marijo Conception M.D.   On: 09/11/2022 15:07   DG Lumbar Spine 2-3 Views  Result Date: 09/11/2022 CLINICAL DATA:  Lower back pain after multiple falls. EXAM: LUMBAR SPINE - 2-3 VIEW COMPARISON:  June 11, 2022. FINDINGS: There is no evidence of lumbar spine fracture. Alignment is normal. Mild degenerative disc disease is noted at L4-5. 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  No acute abnormality seen. Electronically Signed   By: Marijo Conception M.D.   On: 09/11/2022 15:05    Procedures Procedures    Medications Ordered in ED Medications - No data to display  ED Course/ Medical Decision Making/ A&P                           Medical Decision Making Amount and/or Complexity of Data Reviewed Labs:     Details: No anemia or significant electrolyte disturbance.  TSH normal.  Troponin normal. Radiology: independent interpretation performed.    Details: See the head without head bleed.  No fractures to the L-spine or  hip/pelvis ECG/medicine tests: independent interpretation performed.    Details: No acute ischemia   Patient presents after recurrent fall.  The balance issues have been ongoing for many years, she estimates about 5 years.  No new balance problems or new symptoms of  suggest stroke and on her exam she is able to ambulate without difficulty.  Work-up from triage viewed/interpreted by myself and there is no obvious head bleed, electrolyte disturbance, etc.  I do not think an emergent MRI is warranted.  I doubt occult fracture.  Discussed over the phone with the son and we discussed ordering home health.  I have consulted TOC.  Troponins were sent in triage but the patient is denying any type of chest pain. Stable for discharge home.        Final Clinical Impression(s) / ED Diagnoses Final diagnoses:  Fall, initial encounter    Rx / DC Orders ED Discharge Orders          Ordered    Home Health  Status:  Canceled        09/11/22 1739    Face-to-face encounter (required for Medicare/Medicaid patients)  Status:  Canceled       Comments: I Ephraim Hamburger certify that this patient is under my care and that I, or a nurse practitioner or physician's assistant working with me, had a face-to-face encounter that meets the physician face-to-face encounter requirements with this patient on 09/11/2022. The encounter with the patient was in whole, or in part for the following medical condition(s) which is the primary reason for home health care (List medical condition): Frequent falls, balance issues   09/11/22 [place]        09/11/22 1830    Face-to-face encounter (required for Medicare/Medicaid patients)       Comments: I Ephraim Hamburger certify that this patient is under my care and that I, or a nurse practitioner or physician's assistant working with me, had a face-to-face encounter that meets the physician face-to-face encounter requirements with this patient on 09/11/2022. The  encounter with the patient was in whole, or in part for the following medical condition(s) which is the primary reason for home health care (List medical condition): Fall, ambulation issues   09/11/22 1830              Sherwood Gambler, MD 09/11/22 2338

## 2022-09-11 NOTE — Progress Notes (Signed)
TOC CSW spoke with Kendra Simmons has accepted pt for [facility] PT and OT.  Kendra Simmons, MSW, LCSW-A Pronouns:  She/Her/Hers [facility] HealthTransitions of Care Clinical Social Worker Direct Number:  [phone] Kendra [email or web address]

## 2022-09-21 ENCOUNTER — Other Ambulatory Visit (HOSPITAL_COMMUNITY): Payer: Self-pay | Admitting: Psychiatry

## 2022-09-21 DIAGNOSIS — F411 Generalized anxiety disorder: Secondary | ICD-10-CM

## 2022-09-21 DIAGNOSIS — F330 Major depressive disorder, recurrent, mild: Secondary | ICD-10-CM

## 2022-10-04 ENCOUNTER — Telehealth: Payer: Self-pay

## 2022-10-04 NOTE — Telephone Encounter (Signed)
Patient called and left a message for medication refills. She did not leave the names of which medications that needed refills. I have call 3 times not able to reach her. Also not able to leave a voicemail (ph [phone]).   PMP Report:   Filled  Written  ID  Drug  QTY  Days  Prescriber  RX #  Dispenser  Refill  Daily Dose*  Pymt Type  PMP  09/21/2022 05/17/2022 1  Pregabalin 100 Mg Capsule 90.00 90 Kr Rau [id number] Nor ([id number]) 1/1 0.67 LME Medicare [place] 06/16/2022 05/17/2022 1  Pregabalin 100 Mg Capsule 90.00 90 Kr Rau [id number] Nor ([id number]) 0/1 0.67 LME Medicare [place]

## 2022-10-05 NOTE — Telephone Encounter (Signed)
I called all phone numbers and contacts in patient chart. I was finally able to leave a message on the [employer] Asking the patient to call back with details.

## 2022-10-09 ENCOUNTER — Other Ambulatory Visit: Payer: Self-pay | Admitting: Physical Medicine and Rehabilitation

## 2022-10-12 ENCOUNTER — Other Ambulatory Visit: Payer: Self-pay | Admitting: Physical Medicine and Rehabilitation

## 2022-10-12 ENCOUNTER — Encounter: Payer: 59 | Attending: Physical Medicine and Rehabilitation | Admitting: Physical Medicine and Rehabilitation

## 2022-10-12 DIAGNOSIS — F330 Major depressive disorder, recurrent, mild: Secondary | ICD-10-CM

## 2022-10-12 DIAGNOSIS — W19XXXS Unspecified fall, sequela: Secondary | ICD-10-CM | POA: Diagnosis not present

## 2022-10-12 DIAGNOSIS — M25552 Pain in left hip: Secondary | ICD-10-CM

## 2022-10-12 DIAGNOSIS — Z5900 Homelessness unspecified: Secondary | ICD-10-CM

## 2022-10-12 DIAGNOSIS — M25551 Pain in right hip: Secondary | ICD-10-CM | POA: Diagnosis not present

## 2022-10-12 DIAGNOSIS — M7062 Trochanteric bursitis, left hip: Secondary | ICD-10-CM

## 2022-10-12 DIAGNOSIS — F411 Generalized anxiety disorder: Secondary | ICD-10-CM

## 2022-10-12 MED ORDER — AMITRIPTYLINE HCL 150 MG PO TABS: 150.0000 mg | ORAL_TABLET | Freq: Every day | ORAL | 3 refills | Status: DC

## 2022-10-12 MED ORDER — QUETIAPINE FUMARATE 200 MG PO TABS: 200.0000 mg | ORAL_TABLET | Freq: Every day | ORAL | 3 refills | Status: DC

## 2022-10-12 MED ORDER — METHOCARBAMOL 500 MG PO TABS: 500.0000 mg | ORAL_TABLET | Freq: Four times a day (QID) | ORAL | 3 refills | Status: DC | PRN

## 2022-10-12 MED ORDER — PREGABALIN 100 MG PO CAPS: 100.0000 mg | ORAL_CAPSULE | Freq: Every day | ORAL | 1 refills | Status: DC
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
## 2022-10-13 MED ORDER — BUPROPION HCL ER (XL) 150 MG PO TB24: ORAL_TABLET | ORAL | 0 refills | Status: DC

## 2022-10-13 NOTE — Addendum Note (Signed)
Addended by: Izora Ribas on: 10/13/2022 07:58 PM   Modules accepted: Orders

## 2022-10-15 ENCOUNTER — Telehealth: Payer: Self-pay

## 2022-10-15 NOTE — Telephone Encounter (Signed)
Alternative requested for Methocarbamol. Plan limitations exceeded for the year.

## 2022-10-18 ENCOUNTER — Encounter: Payer: 59 | Attending: Physical Medicine and Rehabilitation | Admitting: Physical Medicine and Rehabilitation

## 2022-10-18 ENCOUNTER — Encounter (HOSPITAL_COMMUNITY): Payer: Self-pay

## 2022-10-18 ENCOUNTER — Emergency Department (HOSPITAL_COMMUNITY): Payer: 59

## 2022-10-18 ENCOUNTER — Other Ambulatory Visit: Payer: Self-pay

## 2022-10-18 ENCOUNTER — Inpatient Hospital Stay (HOSPITAL_COMMUNITY)
Admission: EM | Admit: 2022-10-18 | Discharge: 2022-10-30 | DRG: 914 | Disposition: A | Discharge status: Home Health | Payer: 59 | Attending: General Surgery | Admitting: General Surgery

## 2022-10-18 DIAGNOSIS — R296 Repeated falls: Secondary | ICD-10-CM | POA: Diagnosis present

## 2022-10-18 DIAGNOSIS — F419 Anxiety disorder, unspecified: Secondary | ICD-10-CM | POA: Diagnosis present

## 2022-10-18 DIAGNOSIS — S301XXA Contusion of abdominal wall, initial encounter: Secondary | ICD-10-CM | POA: Diagnosis not present

## 2022-10-18 DIAGNOSIS — S36892A Contusion of other intra-abdominal organs, initial encounter: Secondary | ICD-10-CM | POA: Diagnosis not present

## 2022-10-18 DIAGNOSIS — K219 Gastro-esophageal reflux disease without esophagitis: Secondary | ICD-10-CM | POA: Diagnosis present

## 2022-10-18 DIAGNOSIS — Z881 Allergy status to other antibiotic agents status: Secondary | ICD-10-CM

## 2022-10-18 DIAGNOSIS — Z6832 Body mass index (BMI) 32.0-32.9, adult: Secondary | ICD-10-CM

## 2022-10-18 DIAGNOSIS — M7062 Trochanteric bursitis, left hip: Secondary | ICD-10-CM | POA: Insufficient documentation

## 2022-10-18 DIAGNOSIS — F32A Depression, unspecified: Secondary | ICD-10-CM | POA: Diagnosis present

## 2022-10-18 DIAGNOSIS — M1990 Unspecified osteoarthritis, unspecified site: Secondary | ICD-10-CM | POA: Diagnosis present

## 2022-10-18 DIAGNOSIS — F4311 Post-traumatic stress disorder, acute: Secondary | ICD-10-CM

## 2022-10-18 DIAGNOSIS — Z23 Encounter for immunization: Secondary | ICD-10-CM

## 2022-10-18 DIAGNOSIS — E669 Obesity, unspecified: Secondary | ICD-10-CM | POA: Diagnosis present

## 2022-10-18 DIAGNOSIS — Z888 Allergy status to other drugs, medicaments and biological substances status: Secondary | ICD-10-CM

## 2022-10-18 DIAGNOSIS — S3011XA Contusion of abdominal wall, initial encounter: Secondary | ICD-10-CM | POA: Diagnosis present

## 2022-10-18 DIAGNOSIS — E7800 Pure hypercholesterolemia, unspecified: Secondary | ICD-10-CM | POA: Diagnosis present

## 2022-10-18 DIAGNOSIS — Z7985 Long-term (current) use of injectable non-insulin antidiabetic drugs: Secondary | ICD-10-CM

## 2022-10-18 DIAGNOSIS — Y92410 Unspecified street and highway as the place of occurrence of the external cause: Secondary | ICD-10-CM

## 2022-10-18 DIAGNOSIS — G4700 Insomnia, unspecified: Secondary | ICD-10-CM | POA: Diagnosis present

## 2022-10-18 DIAGNOSIS — K5900 Constipation, unspecified: Secondary | ICD-10-CM | POA: Diagnosis present

## 2022-10-18 DIAGNOSIS — F411 Generalized anxiety disorder: Secondary | ICD-10-CM | POA: Insufficient documentation

## 2022-10-18 DIAGNOSIS — R339 Retention of urine, unspecified: Secondary | ICD-10-CM | POA: Diagnosis present

## 2022-10-18 DIAGNOSIS — I951 Orthostatic hypotension: Secondary | ICD-10-CM | POA: Diagnosis not present

## 2022-10-18 DIAGNOSIS — Z79899 Other long term (current) drug therapy: Secondary | ICD-10-CM

## 2022-10-18 LAB — URINALYSIS, ROUTINE W REFLEX MICROSCOPIC
Bacteria, UA: NONE SEEN
Bilirubin Urine: NEGATIVE
Glucose, UA: NEGATIVE mg/dL
Hgb urine dipstick: NEGATIVE
Ketones, ur: NEGATIVE mg/dL
Nitrite: NEGATIVE
Protein, ur: 30 mg/dL — AB
Specific Gravity, Urine: 1.026 (ref 1.005–1.030)
pH: 5 (ref 5.0–8.0)

## 2022-10-18 LAB — COMPREHENSIVE METABOLIC PANEL
ALT: 17 U/L (ref 0–44)
AST: 28 U/L (ref 15–41)
Albumin: 4.1 g/dL (ref 3.5–5.0)
Alkaline Phosphatase: 63 U/L (ref 38–126)
Anion gap: 10 (ref 5–15)
BUN: 16 mg/dL (ref 8–23)
CO2: 22 mmol/L (ref 22–32)
Calcium: 9.4 mg/dL (ref 8.9–10.3)
Chloride: 108 mmol/L (ref 98–111)
Creatinine, Ser: 1.01 mg/dL — ABNORMAL HIGH (ref 0.44–1.00)
GFR, Estimated: 60 mL/min — ABNORMAL LOW (ref >60)
Glucose, Bld: 150 mg/dL — ABNORMAL HIGH (ref 70–99)
Potassium: 4.6 mmol/L (ref 3.5–5.1)
Sodium: 140 mmol/L (ref 135–145)
Total Bilirubin: 0.8 mg/dL (ref 0.3–1.2)
Total Protein: 6.6 g/dL (ref 6.5–8.1)

## 2022-10-18 LAB — CBC
HCT: 38.2 % (ref 36.0–46.0)
Hemoglobin: 11.9 g/dL — ABNORMAL LOW (ref 12.0–15.0)
MCH: 31.4 pg (ref 26.0–34.0)
MCHC: 31.2 g/dL (ref 30.0–36.0)
MCV: 100.8 fL — ABNORMAL HIGH (ref 80.0–100.0)
Platelets: 121 10*3/uL — ABNORMAL LOW (ref 150–400)
RBC: 3.79 MIL/uL — ABNORMAL LOW (ref 3.87–5.11)
RDW: 12.2 % (ref 11.5–15.5)
WBC: 8.2 10*3/uL (ref 4.0–10.5)
nRBC: 0 % (ref 0.0–0.2)

## 2022-10-18 LAB — PROTIME-INR
INR: 1 (ref 0.8–1.2)
Prothrombin Time: 12.9 seconds (ref 11.4–15.2)

## 2022-10-18 LAB — SAMPLE TO BLOOD BANK

## 2022-10-18 MED ORDER — MORPHINE SULFATE (PF) 4 MG/ML IV SOLN
4.0000 mg | Freq: Once | INTRAVENOUS | Status: AC
  Administered 2022-10-19: 4 mg via INTRAVENOUS
  Filled 2022-10-18: qty 1

## 2022-10-18 MED ORDER — IOHEXOL 350 MG/ML SOLN
75.0000 mL | Freq: Once | INTRAVENOUS | Status: AC | PRN
  Administered 2022-10-18: 75 mL via INTRAVENOUS

## 2022-10-18 MED ORDER — MORPHINE SULFATE (PF) 4 MG/ML IV SOLN
4.0000 mg | Freq: Once | INTRAVENOUS | Status: AC
  Administered 2022-10-18: 4 mg via INTRAVENOUS
  Filled 2022-10-18: qty 1
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
## 2022-10-18 NOTE — ED Notes (Signed)
Patient transported to CT 
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
## 2022-10-18 NOTE — Progress Notes (Signed)
Attempted to call patient but there was no option to leave a voicemail

## 2022-10-19 DIAGNOSIS — Y92410 Unspecified street and highway as the place of occurrence of the external cause: Secondary | ICD-10-CM | POA: Diagnosis not present

## 2022-10-19 DIAGNOSIS — F32A Depression, unspecified: Secondary | ICD-10-CM | POA: Diagnosis present

## 2022-10-19 DIAGNOSIS — G4700 Insomnia, unspecified: Secondary | ICD-10-CM | POA: Diagnosis present

## 2022-10-19 DIAGNOSIS — Z6832 Body mass index (BMI) 32.0-32.9, adult: Secondary | ICD-10-CM | POA: Diagnosis not present

## 2022-10-19 DIAGNOSIS — Z7985 Long-term (current) use of injectable non-insulin antidiabetic drugs: Secondary | ICD-10-CM | POA: Diagnosis not present

## 2022-10-19 DIAGNOSIS — S36892A Contusion of other intra-abdominal organs, initial encounter: Secondary | ICD-10-CM | POA: Diagnosis present

## 2022-10-19 DIAGNOSIS — Z881 Allergy status to other antibiotic agents status: Secondary | ICD-10-CM | POA: Diagnosis not present

## 2022-10-19 DIAGNOSIS — E7800 Pure hypercholesterolemia, unspecified: Secondary | ICD-10-CM | POA: Diagnosis present

## 2022-10-19 DIAGNOSIS — Z23 Encounter for immunization: Secondary | ICD-10-CM | POA: Diagnosis present

## 2022-10-19 DIAGNOSIS — M1990 Unspecified osteoarthritis, unspecified site: Secondary | ICD-10-CM | POA: Diagnosis present

## 2022-10-19 DIAGNOSIS — R296 Repeated falls: Secondary | ICD-10-CM | POA: Diagnosis present

## 2022-10-19 DIAGNOSIS — F419 Anxiety disorder, unspecified: Secondary | ICD-10-CM | POA: Diagnosis present

## 2022-10-19 DIAGNOSIS — K219 Gastro-esophageal reflux disease without esophagitis: Secondary | ICD-10-CM | POA: Diagnosis present

## 2022-10-19 DIAGNOSIS — Z79899 Other long term (current) drug therapy: Secondary | ICD-10-CM | POA: Diagnosis not present

## 2022-10-19 DIAGNOSIS — E669 Obesity, unspecified: Secondary | ICD-10-CM | POA: Diagnosis present

## 2022-10-19 DIAGNOSIS — Z888 Allergy status to other drugs, medicaments and biological substances status: Secondary | ICD-10-CM | POA: Diagnosis not present

## 2022-10-19 DIAGNOSIS — I951 Orthostatic hypotension: Secondary | ICD-10-CM | POA: Diagnosis not present

## 2022-10-19 DIAGNOSIS — R339 Retention of urine, unspecified: Secondary | ICD-10-CM | POA: Diagnosis present

## 2022-10-19 DIAGNOSIS — S301XXA Contusion of abdominal wall, initial encounter: Secondary | ICD-10-CM | POA: Diagnosis present

## 2022-10-19 DIAGNOSIS — K5900 Constipation, unspecified: Secondary | ICD-10-CM | POA: Diagnosis present

## 2022-10-19 LAB — CBC
HCT: 35.8 % — ABNORMAL LOW (ref 36.0–46.0)
Hemoglobin: 11.6 g/dL — ABNORMAL LOW (ref 12.0–15.0)
MCH: 31.8 pg (ref 26.0–34.0)
MCHC: 32.4 g/dL (ref 30.0–36.0)
MCV: 98.1 fL (ref 80.0–100.0)
Platelets: 114 10*3/uL — ABNORMAL LOW (ref 150–400)
RBC: 3.65 MIL/uL — ABNORMAL LOW (ref 3.87–5.11)
RDW: 12.2 % (ref 11.5–15.5)
WBC: 8 10*3/uL (ref 4.0–10.5)
nRBC: 0 % (ref 0.0–0.2)

## 2022-10-19 LAB — CREATININE, SERUM
Creatinine, Ser: 0.9 mg/dL (ref 0.44–1.00)
GFR, Estimated: >60 mL/min (ref >60)

## 2022-10-19 MED ORDER — METOPROLOL TARTRATE 5 MG/5ML IV SOLN: 5.0000 mg | Freq: Four times a day (QID) | INTRAVENOUS | Status: DC | PRN

## 2022-10-19 MED ORDER — MORPHINE SULFATE (PF) 2 MG/ML IV SOLN
2.0000 mg | INTRAVENOUS | Status: DC | PRN
  Administered 2022-10-19: 4 mg via INTRAVENOUS
  Filled 2022-10-19: qty 2

## 2022-10-19 MED ORDER — INFLUENZA VAC A&B SA ADJ QUAD 0.5 ML IM PRSY
0.5000 mL | PREFILLED_SYRINGE | INTRAMUSCULAR | Status: AC
  Administered 2022-10-21: 0.5 mL via INTRAMUSCULAR
  Filled 2022-10-19: qty 0.5

## 2022-10-19 MED ORDER — ACETAMINOPHEN 500 MG PO TABS
1000.0000 mg | ORAL_TABLET | Freq: Four times a day (QID) | ORAL | Status: DC
  Administered 2022-10-19 – 2022-10-30 (×37): 1000 mg via ORAL
  Filled 2022-10-19 (×40): qty 2

## 2022-10-19 MED ORDER — ONDANSETRON 4 MG PO TBDP: 4.0000 mg | ORAL_TABLET | Freq: Four times a day (QID) | ORAL | Status: DC | PRN

## 2022-10-19 MED ORDER — LACTATED RINGERS IV BOLUS
500.0000 mL | Freq: Once | INTRAVENOUS | Status: AC
  Administered 2022-10-19: 500 mL via INTRAVENOUS

## 2022-10-19 MED ORDER — SALINE SPRAY 0.65 % NA SOLN
1.0000 | NASAL | Status: DC | PRN
  Administered 2022-10-20 – 2022-10-23 (×3): 1 via NASAL
  Filled 2022-10-19: qty 44

## 2022-10-19 MED ORDER — ONDANSETRON HCL 4 MG/2ML IJ SOLN
4.0000 mg | Freq: Four times a day (QID) | INTRAMUSCULAR | Status: DC | PRN
  Administered 2022-10-20: 4 mg via INTRAVENOUS
  Filled 2022-10-19: qty 2

## 2022-10-19 MED ORDER — POLYETHYLENE GLYCOL 3350 17 G PO PACK
17.0000 g | PACK | Freq: Every day | ORAL | Status: DC | PRN
  Administered 2022-10-20 – 2022-10-22 (×3): 17 g via ORAL
  Filled 2022-10-19 (×2): qty 1

## 2022-10-19 MED ORDER — DEXTROSE-NACL 5-0.45 % IV SOLN: INTRAVENOUS | Status: DC

## 2022-10-19 MED ORDER — OXYCODONE HCL 5 MG PO TABS
5.0000 mg | ORAL_TABLET | ORAL | Status: DC | PRN
  Administered 2022-10-19 – 2022-10-20 (×4): 5 mg via ORAL
  Filled 2022-10-19 (×4): qty 1

## 2022-10-19 MED ORDER — LACTATED RINGERS IV SOLN: INTRAVENOUS | Status: DC

## 2022-10-19 MED ORDER — MELATONIN 3 MG PO TABS
3.0000 mg | ORAL_TABLET | Freq: Every evening | ORAL | Status: DC | PRN
  Administered 2022-10-19 (×2): 3 mg via ORAL
  Filled 2022-10-19 (×3): qty 1

## 2022-10-19 MED ORDER — ACETAMINOPHEN 325 MG PO TABS: 650.0000 mg | ORAL_TABLET | ORAL | Status: DC | PRN

## 2022-10-19 MED ORDER — METHOCARBAMOL 500 MG PO TABS
1000.0000 mg | ORAL_TABLET | Freq: Three times a day (TID) | ORAL | Status: DC
  Administered 2022-10-19 – 2022-10-30 (×33): 1000 mg via ORAL
  Filled 2022-10-19 (×34): qty 2

## 2022-10-19 MED ORDER — IBUPROFEN 200 MG PO TABS
800.0000 mg | ORAL_TABLET | Freq: Three times a day (TID) | ORAL | Status: DC
  Administered 2022-10-20 – 2022-10-30 (×26): 800 mg via ORAL
  Filled 2022-10-19 (×2): qty 4
  Filled 2022-10-19: qty 1
  Filled 2022-10-19 (×26): qty 4

## 2022-10-19 MED ORDER — ENOXAPARIN SODIUM 30 MG/0.3ML IJ SOSY
30.0000 mg | PREFILLED_SYRINGE | Freq: Two times a day (BID) | INTRAMUSCULAR | Status: DC
  Administered 2022-10-20 – 2022-10-30 (×21): 30 mg via SUBCUTANEOUS
  Filled 2022-10-19 (×22): qty 0.3

## 2022-10-19 NOTE — ED Notes (Signed)
[place] paged for RN at 8:45am
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
## 2022-10-19 NOTE — TOC CAGE-AID Note (Signed)
Transition of Care [facility] [address]) - CAGE-AID Screening   Patient Details  Name: Kendra Simmons MRN: [id number] Date of Birth: 1951/12/04  Clinical Narrative:  Patient denies any alcohol or drug use. No need for substance abuse education at this time.  CAGE-AID Screening:    Have You Ever Felt You Ought to Cut Down on Your Drinking or Drug Use?: No Have People Annoyed You By Critizing Your Drinking Or Drug Use?: No Have You Felt Bad Or Guilty About Your Drinking Or Drug Use?: No Have You Ever Had a Drink or Used Drugs First Thing In The Morning to Steady Your Nerves or to Get Rid of a Hangover?: No CAGE-AID Score: 0  Substance Abuse Education Offered: No

## 2022-10-20 MED ORDER — PREGABALIN 100 MG PO CAPS
100.0000 mg | ORAL_CAPSULE | Freq: Every day | ORAL | Status: DC
  Administered 2022-10-20 – 2022-10-29 (×10): 100 mg via ORAL
  Filled 2022-10-20 (×10): qty 1

## 2022-10-20 MED ORDER — MEMANTINE HCL 10 MG PO TABS
10.0000 mg | ORAL_TABLET | Freq: Two times a day (BID) | ORAL | Status: DC
  Administered 2022-10-20 – 2022-10-30 (×21): 10 mg via ORAL
  Filled 2022-10-20 (×21): qty 1

## 2022-10-20 MED ORDER — POLYETHYLENE GLYCOL 3350 17 G PO PACK
17.0000 g | PACK | Freq: Every day | ORAL | Status: DC
  Administered 2022-10-20 – 2022-10-23 (×2): 17 g via ORAL
  Filled 2022-10-20 (×3): qty 1

## 2022-10-20 MED ORDER — PANTOPRAZOLE SODIUM 40 MG PO TBEC
40.0000 mg | DELAYED_RELEASE_TABLET | Freq: Every day | ORAL | Status: DC
  Administered 2022-10-20 – 2022-10-30 (×11): 40 mg via ORAL
  Filled 2022-10-20 (×11): qty 1

## 2022-10-20 MED ORDER — DIPHENHYDRAMINE-APAP (SLEEP) 25-500 MG PO TABS: 2.0000 | ORAL_TABLET | Freq: Every day | ORAL | Status: DC

## 2022-10-20 MED ORDER — BUPROPION HCL ER (XL) 150 MG PO TB24
150.0000 mg | ORAL_TABLET | Freq: Every day | ORAL | Status: DC
  Administered 2022-10-20 – 2022-10-30 (×11): 150 mg via ORAL
  Filled 2022-10-20 (×11): qty 1

## 2022-10-20 MED ORDER — TAMSULOSIN HCL 0.4 MG PO CAPS
0.4000 mg | ORAL_CAPSULE | Freq: Every day | ORAL | Status: DC
  Administered 2022-10-20 – 2022-10-29 (×10): 0.4 mg via ORAL
  Filled 2022-10-20 (×10): qty 1

## 2022-10-20 MED ORDER — FLUTICASONE PROPIONATE 50 MCG/ACT NA SUSP
1.0000 | Freq: Every day | NASAL | Status: DC
  Administered 2022-10-21 – 2022-10-30 (×9): 1 via NASAL
  Filled 2022-10-20: qty 16

## 2022-10-20 MED ORDER — LISINOPRIL 5 MG PO TABS
2.5000 mg | ORAL_TABLET | Freq: Every day | ORAL | Status: DC
  Administered 2022-10-20 – 2022-10-30 (×10): 2.5 mg via ORAL
  Filled 2022-10-20 (×12): qty 1

## 2022-10-20 MED ORDER — NITROFURANTOIN MONOHYD MACRO 100 MG PO CAPS
100.0000 mg | ORAL_CAPSULE | Freq: Two times a day (BID) | ORAL | Status: AC
  Administered 2022-10-20 – 2022-10-22 (×6): 100 mg via ORAL
  Filled 2022-10-20 (×6): qty 1

## 2022-10-20 MED ORDER — QUETIAPINE FUMARATE 100 MG PO TABS
200.0000 mg | ORAL_TABLET | Freq: Every day | ORAL | Status: DC
  Administered 2022-10-20 – 2022-10-29 (×10): 200 mg via ORAL
  Filled 2022-10-20 (×10): qty 2

## 2022-10-20 MED ORDER — AMITRIPTYLINE HCL 50 MG PO TABS
150.0000 mg | ORAL_TABLET | Freq: Every day | ORAL | Status: DC
  Administered 2022-10-20 – 2022-10-29 (×10): 150 mg via ORAL
  Filled 2022-10-20 (×10): qty 3
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
## 2022-10-20 NOTE — NC FL2 (Signed)
MEDICAID FL2 LEVEL OF CARE SCREENING TOOL     IDENTIFICATION  Patient Name: Kendra Simmons Birthdate: 1951/09/30 Sex: female Admission Date (Current Location): 10/18/2022  [facility] and [place] Number:  Herbalist and Address:  [facility] [place], [place] [address] [place], [place] [address]      Provider Number: [id number]  Attending Physician Name and Address:  Md, Trauma, MD  Relative Name and Phone Number:  Ryllie Nieland [phone]    Current Level of Care: Hospital Recommended Level of Care: [place] Prior Approval Number:    Date Approved/Denied:   PASRR Number: PENDING  Discharge Plan: SNF    Current Diagnoses: Patient Active Problem List   Diagnosis Date Noted   Abdominal contusion 10/19/2022   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   Sleep paralysis, 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   Sepsis without acute organ dysfunction ([place])    Regurgitation of stomach contents    Esophageal dysphagia    Community acquired pneumonia of right upper lobe of lung 12/08/2018   Chronic bilateral low back pain 07/01/2017   Dehydration 07/03/2015   UTI (lower urinary tract infection) 07/03/2015   Hypokalemia 07/03/2015   AKI (acute kidney injury) ([place]) 07/03/2015   Tachycardia 05/27/2015   MDD (major depressive disorder), recurrent, severe, with psychosis ([place]) 05/25/2015   Mild neurocognitive disorder 05/25/2015   Insomnia disorder, with non-sleep disorder mental comorbidity, 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   Snoring 12/24/2014   Unintended weight loss 12/24/2014   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   Panic attacks 07/26/2014   Thrombocytopenia ([place]) 12/17/2012   DYSLIPIDEMIA 03/13/2010   GERD 03/13/2010    Orientation RESPIRATION BLADDER Height & Weight     Self, Time, Situation, Place  Normal Continent, External catheter  Weight: 190 lb (86.2 kg) Height:  '5\' 4"'$  (162.6 cm)  BEHAVIORAL SYMPTOMS/MOOD NEUROLOGICAL BOWEL NUTRITION STATUS      Continent Diet (See dc summary)  AMBULATORY STATUS COMMUNICATION OF NEEDS Skin   Extensive Assist Verbally Normal                       Personal Care Assistance Level of Assistance  Bathing, Feeding, Dressing Bathing Assistance: Maximum assistance Feeding assistance: Independent Dressing Assistance: Limited assistance     Functional Limitations Info  Sight, Hearing, Speech Sight Info: Adequate Hearing Info: Adequate Speech Info: Adequate    SPECIAL CARE FACTORS FREQUENCY  PT (By licensed PT), OT (By licensed OT)     PT Frequency: 3x week OT Frequency: 3x week            Contractures Contractures Info: Not present    Additional Factors Info  Code Status, Allergies, Psychotropic Code Status Info: Full Allergies Info: Phentermine  Augmentin (Amoxicillin-pot Clavulanate) Psychotropic Info: buPROPion (WELLBUTRIN XL) 24 hr tablet 150 mg    QUEtiapine (SEROQUEL) tablet 200 mg         Current Medications (10/20/2022):  This is the current hospital active medication list Current Facility-Administered Medications  Medication Dose Route Frequency Provider Last Rate Last Admin   acetaminophen (TYLENOL) tablet 1,000 mg  1,000 mg Oral Q6H Lovick, Montel Culver, MD   1,000 mg at 10/20/22 1329   amitriptyline (ELAVIL) tablet 150 mg  150 mg Oral QHS Armandina Gemma, MD       buPROPion (WELLBUTRIN XL) 24 hr tablet 150 mg  150 mg Oral Daily Armandina Gemma, MD   150 mg  at 10/20/22 1025   enoxaparin (LOVENOX) injection 30 mg  30 mg Subcutaneous Q12H Kinsinger, Arta Bruce, MD   30 mg at 10/20/22 1026   fluticasone (FLONASE) 50 MCG/ACT nasal spray 1 spray  1 spray Each Nare Daily Armandina Gemma, MD       ibuprofen (ADVIL) tablet 800 mg  800 mg Oral Q8H Jesusita Oka, MD   800 mg at 10/20/22 0741   influenza vaccine adjuvanted (FLUAD) injection 0.5 mL  0.5 mL Intramuscular  Tomorrow-1000 Jesusita Oka, MD       lactated ringers infusion   Intravenous Continuous Jesusita Oka, MD 42 mL/hr at 10/20/22 0536 Infusion Verify at 10/20/22 0536   lisinopril (ZESTRIL) tablet 2.5 mg  2.5 mg Oral Daily Armandina Gemma, MD   2.5 mg at 10/20/22 1025   melatonin tablet 3 mg  3 mg Oral QHS PRN Kinsinger, Arta Bruce, MD   3 mg at 10/19/22 2034   memantine (NAMENDA) tablet 10 mg  10 mg Oral BID Armandina Gemma, MD   10 mg at 10/20/22 1025   methocarbamol (ROBAXIN) tablet 1,000 mg  1,000 mg Oral Q8H Lovick, Montel Culver, MD   1,000 mg at 10/20/22 1329   metoprolol tartrate (LOPRESSOR) injection 5 mg  5 mg Intravenous Q6H PRN Kinsinger, Arta Bruce, MD       morphine (PF) 2 MG/ML injection 2-4 mg  2-4 mg Intravenous Q4H PRN Jesusita Oka, MD   4 mg at 10/19/22 2030   nitrofurantoin (macrocrystal-monohydrate) (MACROBID) capsule 100 mg  100 mg Oral Q12H Jesusita Oka, MD   100 mg at 10/20/22 1032   ondansetron (ZOFRAN-ODT) disintegrating tablet 4 mg  4 mg Oral Q6H PRN Kinsinger, Arta Bruce, MD       Or   ondansetron [facility]) injection 4 mg  4 mg Intravenous Q6H PRN Kinsinger, Arta Bruce, MD   4 mg at 10/20/22 0741   oxyCODONE (Oxy IR/ROXICODONE) immediate release tablet 5 mg  5 mg Oral Q4H PRN Kinsinger, Arta Bruce, MD   5 mg at 10/20/22 0535   pantoprazole (PROTONIX) EC tablet 40 mg  40 mg Oral Daily Armandina Gemma, MD   40 mg at 10/20/22 1025   polyethylene glycol (MIRALAX / GLYCOLAX) packet 17 g  17 g Oral Daily PRN Jesusita Oka, MD   17 g at 10/20/22 0741   polyethylene glycol (MIRALAX / GLYCOLAX) packet 17 g  17 g Oral Daily Armandina Gemma, MD   17 g at 10/20/22 1025   pregabalin (LYRICA) capsule 100 mg  100 mg Oral QHS Armandina Gemma, MD       QUEtiapine (SEROQUEL) tablet 200 mg  200 mg Oral QHS Armandina Gemma, MD       sodium chloride (OCEAN) 0.65 % nasal spray 1 spray  1 spray Each Nare PRN Jesusita Oka, MD   1 spray at 10/20/22 0540     Discharge Medications: Please see  discharge summary for a list of discharge medications.  Relevant Imaging Results:  Relevant Lab Results:   Additional Information SSN [id number]  Loreta Ave, LCSWA

## 2022-10-21 LAB — BASIC METABOLIC PANEL
Anion gap: 7 (ref 5–15)
BUN: 13 mg/dL (ref 8–23)
CO2: 25 mmol/L (ref 22–32)
Calcium: 8.9 mg/dL (ref 8.9–10.3)
Chloride: 105 mmol/L (ref 98–111)
Creatinine, Ser: 0.95 mg/dL (ref 0.44–1.00)
GFR, Estimated: >60 mL/min (ref >60)
Glucose, Bld: 148 mg/dL — ABNORMAL HIGH (ref 70–99)
Potassium: 3.6 mmol/L (ref 3.5–5.1)
Sodium: 137 mmol/L (ref 135–145)

## 2022-10-21 LAB — URINE CULTURE

## 2022-10-21 MED ORDER — SENNA 8.6 MG PO TABS
1.0000 | ORAL_TABLET | Freq: Every day | ORAL | Status: DC
  Administered 2022-10-21 – 2022-10-29 (×7): 8.6 mg via ORAL
  Filled 2022-10-21 (×7): qty 1

## 2022-10-21 MED ORDER — CALCIUM CARBONATE ANTACID 500 MG PO CHEW
1.0000 | CHEWABLE_TABLET | Freq: Four times a day (QID) | ORAL | Status: DC | PRN
  Administered 2022-10-21 – 2022-10-28 (×13): 200 mg via ORAL
  Filled 2022-10-21 (×17): qty 1

## 2022-10-21 MED ORDER — DOCUSATE SODIUM 100 MG PO CAPS
100.0000 mg | ORAL_CAPSULE | Freq: Two times a day (BID) | ORAL | Status: DC
  Administered 2022-10-21 – 2022-10-30 (×17): 100 mg via ORAL
  Filled 2022-10-21 (×18): qty 1

## 2022-10-21 MED ORDER — OXYCODONE HCL 5 MG PO TABS
5.0000 mg | ORAL_TABLET | ORAL | Status: DC | PRN
  Administered 2022-10-30 (×2): 5 mg via ORAL
  Filled 2022-10-21 (×2): qty 1

## 2022-10-21 MED ORDER — FLEET ENEMA 7-19 GM/118ML RE ENEM
1.0000 | ENEMA | Freq: Once | RECTAL | Status: AC
  Administered 2022-10-21: 1 via RECTAL
  Filled 2022-10-21: qty 1

## 2022-10-21 NOTE — Plan of Care (Signed)

## 2022-10-21 NOTE — Progress Notes (Signed)
Pt has not spontaneously voided this shift. Bladder scans of 216m, 2252m and 28368mPt I&O cath for 375m71mis am,  tolerated well, scant bleeding noted, pt denies pain but did c/o pressure. IVF infusing at 42ml52m pt does not appear to be in distress, call bell in reach
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
## 2022-10-21 NOTE — Progress Notes (Signed)
Told by pharmacist to wait until 0038 to give tylenol.

## 2022-10-22 MED ORDER — BISACODYL 10 MG RE SUPP
10.0000 mg | Freq: Once | RECTAL | Status: AC
  Administered 2022-10-22: 10 mg via RECTAL
  Filled 2022-10-22: qty 1

## 2022-10-22 MED ORDER — CHLORHEXIDINE GLUCONATE CLOTH 2 % EX PADS
6.0000 | MEDICATED_PAD | Freq: Every day | CUTANEOUS | Status: DC
  Administered 2022-10-22 – 2022-10-30 (×7): 6 via TOPICAL

## 2022-10-22 NOTE — Care Management Important Message (Signed)
Important Message  Patient Details  Name: Kendra Simmons MRN: [id number] Date of Birth: 06-Jan-1951   Medicare Important Message Given:  Yes     Hannah Beat 10/22/2022, 12:34 PM
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
## 2022-10-23 LAB — CBC
HCT: 34.6 % — ABNORMAL LOW (ref 36.0–46.0)
Hemoglobin: 10.9 g/dL — ABNORMAL LOW (ref 12.0–15.0)
MCH: 31.1 pg (ref 26.0–34.0)
MCHC: 31.5 g/dL (ref 30.0–36.0)
MCV: 98.6 fL (ref 80.0–100.0)
Platelets: 144 10*3/uL — ABNORMAL LOW (ref 150–400)
RBC: 3.51 MIL/uL — ABNORMAL LOW (ref 3.87–5.11)
RDW: 12.1 % (ref 11.5–15.5)
WBC: 4.8 10*3/uL (ref 4.0–10.5)
nRBC: 0 % (ref 0.0–0.2)

## 2022-10-23 MED ORDER — LACTATED RINGERS IV BOLUS
500.0000 mL | Freq: Once | INTRAVENOUS | Status: AC
  Administered 2022-10-23: 500 mL via INTRAVENOUS
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
## 2022-10-23 NOTE — Progress Notes (Signed)
Pt sleeping for 0700 meds. Pt didn't want to be waken up. Day shift LPN aware.

## 2022-10-23 NOTE — Progress Notes (Signed)
Pt is sleeping will wait on meds per pt
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
## 2022-10-24 DIAGNOSIS — F4311 Post-traumatic stress disorder, acute: Secondary | ICD-10-CM

## 2022-10-24 DIAGNOSIS — F32A Depression, unspecified: Secondary | ICD-10-CM

## 2022-10-24 LAB — GLUCOSE, CAPILLARY: Glucose-Capillary: 134 mg/dL — ABNORMAL HIGH (ref 70–99)

## 2022-10-24 MED ORDER — POLYETHYLENE GLYCOL 3350 17 G PO PACK
17.0000 g | PACK | Freq: Two times a day (BID) | ORAL | Status: DC
  Administered 2022-10-24 – 2022-10-30 (×9): 17 g via ORAL
  Filled 2022-10-24 (×9): qty 1

## 2022-10-24 MED ORDER — MAGNESIUM HYDROXIDE 400 MG/5ML PO SUSP
30.0000 mL | Freq: Once | ORAL | Status: DC
  Filled 2022-10-24: qty 30

## 2022-10-24 MED ORDER — MAGNESIUM CITRATE PO SOLN
1.0000 | Freq: Once | ORAL | Status: DC
  Filled 2022-10-24: qty 296

## 2022-10-24 MED ORDER — SENNA 8.6 MG PO TABS
2.0000 | ORAL_TABLET | Freq: Once | ORAL | Status: AC
  Administered 2022-10-24: 17.2 mg via ORAL
  Filled 2022-10-24: qty 2

## 2022-10-24 MED ORDER — BISACODYL 5 MG PO TBEC
10.0000 mg | DELAYED_RELEASE_TABLET | Freq: Once | ORAL | Status: AC
  Administered 2022-10-24: 10 mg via ORAL
  Filled 2022-10-24: qty 2

## 2022-10-24 MED ORDER — POLYETHYLENE GLYCOL 3350 17 G PO PACK
17.0000 g | PACK | Freq: Every day | ORAL | Status: DC
  Administered 2022-10-24: 17 g via ORAL
  Filled 2022-10-24 (×5): qty 1
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
## 2022-10-24 NOTE — Progress Notes (Signed)
Mobility Specialist Progress Note   10/24/22 1500  Mobility  Activity Ambulated with assistance in hallway  Level of Assistance Standby assist, set-up cues, supervision of patient - no hands on  Assistive Device Front wheel walker  Distance Ambulated (ft) 100 ft  Range of Motion/Exercises Active;All extremities  Activity Response Tolerated well   Pre Mobility:  HR 76, BP 140/72 During Mobility: HR 96, BP 137/70  Patient received in supine, agreeable to participate in mobility. Ambulated supervision level with slow steady gait. All VSS. No complaints of dizziness or lightheadedness before or during ambulation. Returned to room without complaint or incident. Was left in supine with all needs met, call bell in reach.    [place] Dewon Mendizabal, [place], [place]  Office: [phone]
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
## 2022-10-24 NOTE — TOC Progression Note (Signed)
Transition of Care [facility]) - Progression Note    Patient Details  Name: Kendra Simmons MRN: [id number] Date of Birth: [birth year]  [facility]) CM/SW Contact  Joanne Chars, LCSW Phone Number: 10/24/2022, 3:51 PM  Clinical Narrative:   CSW spoke with pt regarding DC plan.  Pt stating she will not go to SNF and can return home with [facility].  Pt reports she has both a friend and a cousin who can assist at home.  Pt does have medicaid, asked about [place] aide support, CSW will pursue referral so pt can be evaluated for this.    Expected Discharge Plan: Skilled Nursing Facility Barriers to Discharge: Ship broker, Continued Medical Work up  Expected Discharge Plan and Services Expected Discharge Plan: [place] In-house Referral: Clinical Social Work     Living arrangements for the past 2 months: Apartment                                       Social Determinants of Health (SDOH) Interventions    Readmission Risk Interventions     No data to display

## 2022-10-24 NOTE — Plan of Care (Signed)

## 2022-10-25 ENCOUNTER — Encounter: Payer: 59 | Admitting: Physical Medicine and Rehabilitation

## 2022-10-25 MED ORDER — NYSTATIN 100000 UNIT/GM EX POWD
Freq: Two times a day (BID) | CUTANEOUS | Status: DC
  Filled 2022-10-25: qty 15

## 2022-10-25 MED ORDER — ALUM & MAG HYDROXIDE-SIMETH 200-200-20 MG/5ML PO SUSP
30.0000 mL | ORAL | Status: DC | PRN
  Administered 2022-10-25 – 2022-10-28 (×4): 30 mL via ORAL
  Filled 2022-10-25 (×4): qty 30
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
## 2022-10-25 NOTE — Care Management Important Message (Signed)
Important Message  Patient Details  Name: Kendra Simmons MRN: [id number] Date of Birth: [birth year]   Medicare Important Message Given:  Yes     Hannah Beat 10/25/2022, 11:37 AM
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
## 2022-10-25 NOTE — Progress Notes (Signed)
Mobility Specialist Progress Note   10/25/22 1100  Mobility  Activity Ambulated with assistance in hallway  Level of Assistance Standby assist, set-up cues, supervision of patient - no hands on  Assistive Device Front wheel walker  Distance Ambulated (ft) 100 ft  Range of Motion/Exercises Active;All extremities  Activity Response Tolerated well   Pre Ambulation:  HR 80, BP 137/72 During Ambulation: HR 96,  BP 122/60 and 111/62 Post Ambulation: HR 81,  BP 113/62  Patient received in supine eager to participate in mobility. Ambulated supervision level to occasional min guard with slow steady gait. Complained of a "sinking" feeling during ambulation but denied dizziness/lightheadedness. BP did drop while sitting and during ambulation but no LOB observed. Required seated rest break x1 secondary to being symptomatic while ambulating. Returned to room without incident. Was left in supine with all needs met, call bell in reach.   Kendra Simmons, [place], [place]  Office: [phone]
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
## 2022-10-26 LAB — CREATININE, SERUM
Creatinine, Ser: 0.94 mg/dL (ref 0.44–1.00)
GFR, Estimated: >60 mL/min (ref >60)
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
## 2022-10-26 NOTE — Plan of Care (Signed)
  Problem: Education: Goal: Knowledge of General Education information will improve Description: Including pain rating scale, medication(s)/side effects and non-pharmacologic comfort measures Outcome: Not Progressing   Problem: Health Behavior/Discharge Planning: Goal: Ability to manage health-related needs will improve Outcome: Not Progressing   Problem: Clinical Measurements: Goal: Ability to maintain clinical measurements within normal limits will improve Outcome: Not Progressing Goal: Will remain free from infection Outcome: Not Progressing Goal: Diagnostic test results will improve Outcome: Not Progressing Goal: Respiratory complications will improve Outcome: Not Progressing Goal: Cardiovascular complication will be avoided Outcome: Not Progressing   Problem: Nutrition: Goal: Adequate nutrition will be maintained Outcome: Not Progressing   Problem: Coping: Goal: Level of anxiety will decrease Outcome: Not Progressing   Problem: Elimination: Goal: Will not experience complications related to bowel motility Outcome: Not Progressing Goal: Will not experience complications related to urinary retention Outcome: Not Progressing   Problem: Skin Integrity: Goal: Risk for impaired skin integrity will decrease Outcome: Not Progressing   Problem: Safety: Goal: Ability to remain free from injury will improve Outcome: Not Progressing

## 2022-10-26 NOTE — Progress Notes (Signed)
Mobility Specialist Progress Note   10/26/22 1739  Mobility  Activity Ambulated with assistance in hallway  Level of Assistance Contact guard assist, steadying assist  Assistive Device Front wheel walker  Distance Ambulated (ft) 100 ft  Activity Response Tolerated well  $Mobility charge 1 Mobility   Pre Mobility: 81 HR, 122/59 BP During Mobility: 101 HR Post Mobility: 85 HR, 127/70 BP  Received pt in chair deferring mobility but agreeable after encouragement. Pt asymptomatic during ambulation, returned back to chair w/o fault. VSS throughout.  Holland Falling Mobility Specialist Acute Rehab Office:  [phone]
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
## 2022-10-27 NOTE — Progress Notes (Signed)
Patient declines 0600 meds. States she didn't go to sleep until after 0200. Oncoming nurse updated.

## 2022-10-27 NOTE — Plan of Care (Signed)
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
## 2022-10-27 NOTE — Progress Notes (Signed)
Mobility Specialist: Progress Note   10/27/22 1300  Mobility  Activity Ambulated with assistance in hallway  Level of Assistance Contact guard assist, steadying assist  Assistive Device Front wheel walker  Distance Ambulated (ft) 100 ft  Activity Response Tolerated well  Mobility Referral Yes  $Mobility charge 1 Mobility   Post-Mobility: 96 HR, 129/59 (76) BP  Pt received in the bed and agreeable to mobility. No c/o throughout. Pt to the chair after session per request with call bell and phone at her side.   [place] Caylin Raby Mobility Specialist Please contact via SecureChat or Rehab office at [phone]
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
## 2022-10-28 NOTE — Progress Notes (Signed)
Mobility Specialist: Progress Note   10/28/22 1209  Mobility  Activity Ambulated with assistance in hallway  Level of Assistance Contact guard assist, steadying assist  Assistive Device Front wheel walker  Distance Ambulated (ft) 140 ft  Activity Response Tolerated well  Mobility Referral Yes  $Mobility charge 1 Mobility   During Mobility: 121 HR Post-Mobility: 205 HR, 145/77 (86) BP  Pt received in the bed and agreeable to mobility. Mod I with bed mobility and contact guard during ambulation. C/o general fatigue, otherwise asymptomatic. Pt to the chair after session per request with call bell and phone at her side.   [place] Miasia Crabtree Mobility Specialist Please contact via SecureChat or Rehab office at [phone]
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
## 2022-10-29 NOTE — Progress Notes (Signed)
Mobility Specialist Progress Note   10/29/22 1755  Mobility  Activity Refused mobility   Pt deferring mobility d/t distraught state and wanting to be left alone. Frustrated w/ uncertainty of d/c. Attempted to reassure/motivate pt but pt still deferring. Made RN aware. Will follow up tomorrow if time permits.  [place] Falling Mobility Specialist Acute Rehab Office:  [phone]

## 2022-10-29 NOTE — TOC Progression Note (Addendum)
Transition of Care [facility]) - Progression Note    Patient Details  Name: Kendra Simmons MRN: [id number] Date of Birth: December 08, 1951  Transition of Care [facility]) CM/SW Contact  Ella Bodo, RN Phone Number: 10/29/2022, 4:11pm  Clinical Narrative:    Met with with patient to review SNF bed offers, and discuss discharge plan.  Patient has no new offers, and she states she will not go to either of the selected facilities.  She does prefer to go home with home health services, and is trying to reach her cousin to confirm needed support at home.  Patient still somewhat orthostatic with ambulation; have concerns about sending patient home without a solid discharge plan.  Patient states she will continue to call her cousin and friends to see if they can assist her at discharge.  Referral to [facility] home health for home health PT/OT; unfortunately they do not have a home health aide available.  They will check insurance benefits and notify me in the morning.  Patient agreeable to plan; will follow-up on 11/14.  Rolling walker with seat and bedside commode have been delivered to bedside.  Expected Discharge Plan: [place] Barriers to Discharge: Continued Medical Work up  Expected Discharge Plan and Services Expected Discharge Plan: [place] In-house Referral: Clinical Social Work Discharge Planning Services: CM Consult Post Acute Care Choice: [place] arrangements for the past 2 months: Apartment                 DME Arranged: Walker rolling with seat, 3-N-1 DME Agency: [employer] Date DME Agency Contacted: 10/29/22 Time DME Agency Contacted: 1100 Representative spoke with at DME Agency: [place]             Social Determinants of Health ([place]) Interventions    Readmission Risk Interventions     No data to display         Reinaldo Raddle, RN, BSN  Trauma/Neuro ICU Case Manager [phone]

## 2022-10-29 NOTE — Progress Notes (Addendum)
Went by to check in with the patient this afternoon. Her foley was removed and she has been able to void, twice.  PT saw her and cleared her for discharge with constant/frequent supervision. Unfortunately, the  patient tells me that she only has one contact who could help her. She states that her cousin, Kendra Simmons, who lives 2 minutes away from her has offered to help her in the afternoons from roughly 2:30-7:00 PM. Also states that this cousin can be unreliable. She gave me Kendra Simmons's phone number [phone]) and gave me her permission to call. I attempted to call but got her voicemail.   I expressed my concerns about discharge home without near 24/7 supervision, given her history of falls and orthostatic hypotension. The patient expressed understanding about these concerns but states she has a "bad taste in her mouth" about going to SNF, due to negative past experience. She is willing to consider other SNF options if there are any, but ultimately thinks she will want to go home.   I will have our trauma case manager check the patients SNF bed offers again. I will also ask her to try to secure home health aide for assistance with bathing/etc 1-2x weekly if possible. DME ordered.   At this time the patient does not have a safe discharge plan.    Kendra Dredge, Kendra Simmons [facility] Please see Amion for pager number during day hours 7:00am-4:30pm
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
## 2022-10-30 ENCOUNTER — Other Ambulatory Visit (HOSPITAL_COMMUNITY): Payer: Self-pay

## 2022-10-30 MED ORDER — ACETAMINOPHEN 500 MG PO TABS
1000.0000 mg | ORAL_TABLET | Freq: Four times a day (QID) | ORAL | 0 refills | Status: DC | PRN
  Filled 2022-10-30: qty 30, 4d supply, fill #0

## 2022-10-30 MED ORDER — DM-GUAIFENESIN ER 30-600 MG PO TB12
1.0000 | ORAL_TABLET | Freq: Two times a day (BID) | ORAL | Status: DC
  Administered 2022-10-30: 1 via ORAL
  Filled 2022-10-30: qty 1

## 2022-10-30 MED ORDER — DM-GUAIFENESIN ER 30-600 MG PO TB12
1.0000 | ORAL_TABLET | Freq: Two times a day (BID) | ORAL | 0 refills | Status: AC | PRN
  Filled 2022-10-30: qty 10, 5d supply, fill #0
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
## 2022-10-30 NOTE — TOC Progression Note (Addendum)
Transition of Care [facility]) - Progression Note    Patient Details  Name: Kendra Simmons MRN: [id number] Date of Birth: [birth year]  [facility]) CM/SW Contact  Ella Bodo, RN Phone Number: 10/30/2022, 9:56 AM  Clinical Narrative:    Attempted to call patient's cousin, Chanda Busing, to discuss needed support at dc, but voice mailbox is full, and was unable to leave message.   Patient still refusing skilled nursing facility placement, and prefers to go home with home health follow-up.  Referral to [facility] home health for PT/OT follow-up. Faxed request for independent assessment for PCS  to [employer] for possible home health aide.  We will add a clinical social worker to home health orders, in case patient requires any assistance with acquiring PCS services at home.  Patient has all needed equipment in room; she states that her friend will pick her up after 3 PM.   Expected Discharge Plan: [place] Barriers to Discharge: Continued Medical Work up  Expected Discharge Plan and Services Expected Discharge Plan: [place] In-house Referral: Clinical Social Work Discharge Planning Services: CM Consult Post Acute Care Choice: [place] arrangements for the past 2 months: Apartment                 DME Arranged: Walker rolling with seat, 3-N-1 DME Agency: [employer] Date DME Agency Contacted: 10/29/22 Time DME Agency Contacted: 1100 Representative spoke with at DME Agency: [place]             Social Determinants of Health ([place]) Interventions    Readmission Risk Interventions     No data to display         Reinaldo Raddle, RN, BSN  Trauma/Neuro ICU Case Manager [phone]

## 2022-10-30 NOTE — Discharge Instructions (Signed)
Stop taking your blood pressure medication (lisinopril) until you see your doctor. Wear your TED hose every day while you're awake
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
## 2022-11-01 ENCOUNTER — Telehealth: Payer: Self-pay | Admitting: Physical Medicine and Rehabilitation
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
## 2022-11-02 ENCOUNTER — Other Ambulatory Visit: Payer: Self-pay | Admitting: Physical Medicine and Rehabilitation

## 2022-11-02 ENCOUNTER — Encounter: Payer: 59 | Admitting: Physical Medicine and Rehabilitation

## 2022-11-02 DIAGNOSIS — F411 Generalized anxiety disorder: Secondary | ICD-10-CM

## 2022-11-02 DIAGNOSIS — F330 Major depressive disorder, recurrent, mild: Secondary | ICD-10-CM

## 2022-11-02 MED ORDER — QUETIAPINE FUMARATE 200 MG PO TABS: 200.0000 mg | ORAL_TABLET | Freq: Every day | ORAL | 3 refills | Status: DC

## 2022-11-02 MED ORDER — BUPROPION HCL ER (XL) 150 MG PO TB24: ORAL_TABLET | ORAL | 0 refills | Status: DC

## 2022-11-02 MED ORDER — ROSUVASTATIN CALCIUM 10 MG PO TABS: 10.0000 mg | ORAL_TABLET | Freq: Every day | ORAL | 1 refills | Status: DC

## 2022-11-02 MED ORDER — AMITRIPTYLINE HCL 150 MG PO TABS: 150.0000 mg | ORAL_TABLET | Freq: Every day | ORAL | 3 refills | Status: DC

## 2022-11-02 MED ORDER — PREGABALIN 100 MG PO CAPS: 100.0000 mg | ORAL_CAPSULE | Freq: Every day | ORAL | 1 refills | Status: DC

## 2022-11-02 MED ORDER — METHOCARBAMOL 500 MG PO TABS: 500.0000 mg | ORAL_TABLET | Freq: Four times a day (QID) | ORAL | 3 refills | Status: DC | PRN

## 2022-11-02 NOTE — Progress Notes (Signed)
Called patient and left VM but did not hear back from her. Sent refills for the medications I have prescribed for her.

## 2022-11-05 ENCOUNTER — Telehealth: Payer: Self-pay

## 2022-11-05 ENCOUNTER — Other Ambulatory Visit (HOSPITAL_COMMUNITY): Payer: Self-pay

## 2022-11-05 ENCOUNTER — Other Ambulatory Visit: Payer: Self-pay | Admitting: Physical Medicine and Rehabilitation

## 2022-11-05 ENCOUNTER — Encounter: Payer: 59 | Admitting: Physical Medicine and Rehabilitation

## 2022-11-05 DIAGNOSIS — F330 Major depressive disorder, recurrent, mild: Secondary | ICD-10-CM

## 2022-11-05 DIAGNOSIS — F411 Generalized anxiety disorder: Secondary | ICD-10-CM

## 2022-11-05 MED ORDER — BUPROPION HCL ER (XL) 150 MG PO TB24: ORAL_TABLET | ORAL | 0 refills | Status: DC

## 2022-11-05 NOTE — Telephone Encounter (Signed)
Patient call in for a medication refill of Wellbutrin. Patient stated the medication fell out in her purse. She could only find 5 tablets.

## 2022-12-09 ENCOUNTER — Other Ambulatory Visit: Payer: Self-pay | Admitting: Physical Medicine and Rehabilitation

## 2022-12-11 ENCOUNTER — Encounter: Payer: 59 | Admitting: Physical Medicine and Rehabilitation

## 2022-12-18 ENCOUNTER — Encounter: Payer: Self-pay | Admitting: Physical Medicine and Rehabilitation

## 2022-12-18 ENCOUNTER — Encounter: Payer: 59 | Attending: Physical Medicine and Rehabilitation | Admitting: Physical Medicine and Rehabilitation

## 2022-12-18 DIAGNOSIS — M25552 Pain in left hip: Secondary | ICD-10-CM | POA: Diagnosis not present

## 2022-12-18 DIAGNOSIS — R4189 Other symptoms and signs involving cognitive functions and awareness: Secondary | ICD-10-CM | POA: Diagnosis not present

## 2022-12-18 DIAGNOSIS — Z6832 Body mass index (BMI) 32.0-32.9, adult: Secondary | ICD-10-CM | POA: Insufficient documentation

## 2022-12-18 DIAGNOSIS — H538 Other visual disturbances: Secondary | ICD-10-CM | POA: Insufficient documentation

## 2022-12-18 DIAGNOSIS — F32A Depression, unspecified: Secondary | ICD-10-CM | POA: Insufficient documentation

## 2022-12-18 DIAGNOSIS — Z7182 Exercise counseling: Secondary | ICD-10-CM | POA: Insufficient documentation

## 2022-12-18 DIAGNOSIS — Z760 Encounter for issue of repeat prescription: Secondary | ICD-10-CM | POA: Diagnosis not present

## 2022-12-18 DIAGNOSIS — I10 Essential (primary) hypertension: Secondary | ICD-10-CM | POA: Diagnosis not present

## 2022-12-18 DIAGNOSIS — E7800 Pure hypercholesterolemia, unspecified: Secondary | ICD-10-CM | POA: Insufficient documentation

## 2022-12-18 DIAGNOSIS — R253 Fasciculation: Secondary | ICD-10-CM | POA: Diagnosis not present

## 2022-12-18 DIAGNOSIS — Z7985 Long-term (current) use of injectable non-insulin antidiabetic drugs: Secondary | ICD-10-CM | POA: Insufficient documentation

## 2022-12-18 DIAGNOSIS — Z7984 Long term (current) use of oral hypoglycemic drugs: Secondary | ICD-10-CM | POA: Insufficient documentation

## 2022-12-18 DIAGNOSIS — R531 Weakness: Secondary | ICD-10-CM | POA: Diagnosis not present

## 2022-12-18 DIAGNOSIS — Z5900 Homelessness unspecified: Secondary | ICD-10-CM

## 2022-12-18 DIAGNOSIS — F419 Anxiety disorder, unspecified: Secondary | ICD-10-CM | POA: Insufficient documentation

## 2022-12-18 DIAGNOSIS — K5900 Constipation, unspecified: Secondary | ICD-10-CM | POA: Insufficient documentation

## 2022-12-18 DIAGNOSIS — K219 Gastro-esophageal reflux disease without esophagitis: Secondary | ICD-10-CM | POA: Diagnosis not present

## 2022-12-18 DIAGNOSIS — Z79899 Other long term (current) drug therapy: Secondary | ICD-10-CM | POA: Diagnosis not present

## 2022-12-18 DIAGNOSIS — M8580 Other specified disorders of bone density and structure, unspecified site: Secondary | ICD-10-CM | POA: Insufficient documentation

## 2022-12-18 DIAGNOSIS — G4700 Insomnia, unspecified: Secondary | ICD-10-CM | POA: Diagnosis not present

## 2022-12-18 DIAGNOSIS — E538 Deficiency of other specified B group vitamins: Secondary | ICD-10-CM | POA: Diagnosis not present

## 2022-12-18 DIAGNOSIS — M7062 Trochanteric bursitis, left hip: Secondary | ICD-10-CM | POA: Insufficient documentation

## 2022-12-18 DIAGNOSIS — M7918 Myalgia, other site: Secondary | ICD-10-CM | POA: Diagnosis not present

## 2022-12-18 DIAGNOSIS — E669 Obesity, unspecified: Secondary | ICD-10-CM | POA: Diagnosis not present

## 2022-12-18 DIAGNOSIS — E119 Type 2 diabetes mellitus without complications: Secondary | ICD-10-CM | POA: Insufficient documentation

## 2022-12-18 DIAGNOSIS — R682 Dry mouth, unspecified: Secondary | ICD-10-CM | POA: Insufficient documentation

## 2022-12-18 MED ORDER — PREGABALIN 100 MG PO CAPS: 100.0000 mg | ORAL_CAPSULE | Freq: Every day | ORAL | 1 refills | Status: DC

## 2022-12-18 MED ORDER — QUETIAPINE FUMARATE 200 MG PO TABS: 200.0000 mg | ORAL_TABLET | Freq: Every day | ORAL | 3 refills | Status: DC

## 2022-12-18 MED ORDER — QUETIAPINE FUMARATE 50 MG PO TABS: 50.0000 mg | ORAL_TABLET | Freq: Every day | ORAL | 3 refills | Status: DC

## 2022-12-18 MED ORDER — METHOCARBAMOL 500 MG PO TABS: 500.0000 mg | ORAL_TABLET | Freq: Four times a day (QID) | ORAL | 3 refills | Status: DC | PRN

## 2022-12-18 NOTE — Addendum Note (Signed)
Addended by: Izora Ribas on: 12/18/2022 01:37 PM   Modules accepted: Orders

## 2022-12-18 NOTE — Progress Notes (Signed)
Subjective:    Patient ID: Kendra Simmons, female    DOB: [birth year], 72 y.o.   MRN: [id number]  HPI Pain Inventory Average Pain 6 Pain Right Now 4 My pain is  .  In the last 24 hours, has pain interfered with the following? General activity 4 Relation with others 4 Enjoyment of life 4 What TIME of day is your pain at its worst? varies Sleep (in general) Fair  Pain is worse with: walking, standing, and some activites Pain improves with: rest, therapy/exercise, and medication Relief from Meds: 8  Family History  Problem Relation Age of Onset  . Cancer Mother   . Diabetes Mother   . Sleep apnea Father   . Alcohol abuse Father   . Diabetes Sister   . Diabetes Maternal Uncle   . Diabetes Cousin    Social History   Socioeconomic History  . Marital status: Divorced    Spouse name: Not on file  . Number of children: 3  . Years of education: 71  . Highest education level: Not on file  Occupational History  . Occupation: Retired  Tobacco Use  . Smoking status: Never  . Smokeless tobacco: Never  Vaping Use  . Vaping Use: Never used  Substance and Sexual Activity  . Alcohol use: No    Alcohol/week: 0.0 standard drinks of alcohol    Comment: zero  . Drug use: No    Types: Barbituates, Benzodiazepines    Comment: Denies any drug use other than benzos  . Sexual activity: Never    Birth control/protection: Abstinence  Other Topics Concern  . Not on file  Social History Narrative   Patient lives at home, daughter with her.    Caffeine Use:  2 cokes daily   Sister lives next door.   Social Determinants of Health   Financial Resource Strain: Not on file  Food Insecurity: No Food Insecurity (10/19/2022)   Hunger Vital Sign   . Worried About Charity fundraiser in the Last Year: Never true   . Ran Out of Food in the Last Year: Never true  Transportation Needs: No Transportation Needs (10/19/2022)   PRAPARE - Transportation   . Lack of Transportation  (Medical): No   . Lack of Transportation (Non-Medical): No  Physical Activity: Not on file  Stress: Not on file  Social Connections: Not on file   Past Surgical History:  Procedure Laterality Date  . BOTOX INJECTION N/A 10/17/2021   Procedure: BOTOX INJECTION;  Surgeon: Clarene Essex, MD;  Location: [facility];  Service: Endoscopy;  Laterality: N/A;  . CESAREAN SECTION    . COSMETIC SURGERY    . ESOPHAGEAL MANOMETRY N/A 09/26/2016   Procedure: ESOPHAGEAL MANOMETRY (EM);  Surgeon: Clarene Essex, MD;  Location: [facility];  Service: Endoscopy;  Laterality: N/A;  . ESOPHAGOGASTRODUODENOSCOPY (EGD) WITH PROPOFOL N/A 10/17/2021   Procedure: ESOPHAGOGASTRODUODENOSCOPY (EGD) WITH PROPOFOL;  Surgeon: Clarene Essex, MD;  Location: [facility];  Service: Endoscopy;  Laterality: N/A;  . laproscopy     Past Surgical History:  Procedure Laterality Date  . BOTOX INJECTION N/A 10/17/2021   Procedure: BOTOX INJECTION;  Surgeon: Clarene Essex, MD;  Location: [facility];  Service: Endoscopy;  Laterality: N/A;  . CESAREAN SECTION    . COSMETIC SURGERY    . ESOPHAGEAL MANOMETRY N/A 09/26/2016   Procedure: ESOPHAGEAL MANOMETRY (EM);  Surgeon: Clarene Essex, MD;  Location: [facility];  Service: Endoscopy;  Laterality: N/A;  . ESOPHAGOGASTRODUODENOSCOPY (EGD) WITH PROPOFOL N/A 10/17/2021  Procedure: ESOPHAGOGASTRODUODENOSCOPY (EGD) WITH PROPOFOL;  Surgeon: Clarene Essex, MD;  Location: [facility];  Service: Endoscopy;  Laterality: N/A;  . laproscopy     Past Medical History:  Diagnosis Date  . Anxiety   . Arthritis   . Depression   . Emotional depression 02/04/2015  . Frequent falls   . GERD (gastroesophageal reflux disease)   . High cholesterol   . Insomnia   . Memory loss   . Transient alteration of awareness    Ht '5\' 4"'$  (1.626 m)   Wt 185 lb (83.9 kg)   BMI 31.76 kg/m   Opioid Risk Score:   Fall Risk Score:  `1  Depression screen PHQ 2/9     12/18/2022    1:17 PM 04/24/2022   10:08 AM  08/24/2021    9:39 AM 07/04/2021    2:25 PM 11/30/2020   10:19 AM 03/09/2020    3:14 PM 09/12/2015   12:09 PM  Depression screen PHQ 2/9  Decreased Interest 0 1 0 3 3 0   Down, Depressed, Hopeless 0 1 0 3 3 0   PHQ - 2 Score 0 2 0 6 6 0   Altered sleeping         Tired, decreased energy         Change in appetite         Feeling bad or failure about yourself          Trouble concentrating         Moving slowly or fidgety/restless         Suicidal thoughts         PHQ-9 Score         Difficult doing work/chores            Information is confidential and restricted. Go to Review Flowsheets to unlock data.      Review of Systems  Musculoskeletal:  Positive for neck pain.       Right hip pain    All other systems reviewed and are negative.     Objective:   Physical Exam        Assessment & Plan:

## 2022-12-18 NOTE — Progress Notes (Addendum)
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 speed colonic transit 5) kiwis- contain an enzyme actinidin that improves gut motility and reduces constipation 6) oranges- rich in pectin (like apples) 7) grapefruits- contain a flavanol naringenin which has a laxative effect 8) vegetables- rich in fiber and also great sources of folate, vitamin C, and K 9) artichoke- high in inulin, prebiotic great for the microbiome 10) chicory- increases stool frequency and softness (can be added to coffee) 11) rhubarb- laxative effect 12) sweet potato- high fiber 13) beans, peas, and lentils- contain both soluble and insoluble fiber 14) chia seeds- improves intestinal health and gut flora 15) flaxseeds- laxative effect 16) whole grain rye bread- high in fiber 17) oat bran- high in soluble and insoluble fiber 18) kefir- softens stools -recommended to try at least one of these foods every day.  -drink 6-8 glasses of water per day -walk regularly, especially after meals.     20) HTN: -BP is 143/82 today.  -Advised checking BP  daily at home and logging results to bring into follow-up appointment with PCP and myself. -Reviewed BP meds today.  -Advised regarding healthy foods that can help lower blood pressure and provided with a list: 1) citrus foods- high in vitamins and minerals 2) salmon and other fatty fish - reduces inflammation and oxylipins 3) swiss chard (leafy green)- high level of nitrates 4) pumpkin seeds- one of the best natural sources of magnesium 5) Beans and lentils- high in fiber, magnesium, and potassium 6) Berries- high in flavonoids 7) Amaranth (whole grain, can be cooked similarly to rice and oats)- high in magnesium and fiber 8) Pistachios- even more effective at reducing BP than other nuts 9) Carrots- high in phenolic compounds that relax blood vessels and reduce inflammation 10) Celery- contain phthalides that relax tissues of arterial walls 11) Tomatoes- can also improve cholesterol and reduce risk of heart disease 12) Broccoli- good source of magnesium, calcium, and potassium 13) Greek yogurt: high in potassium and calcium 14) Herbs and spices: Celery seed, cilantro, saffron, lemongrass, black cumin, ginseng, cinnamon, cardamom, sweet basil, and ginger 15) Chia and flax seeds- also help to lower cholesterol and blood sugar 16) Beets- high levels of nitrates that relax blood vessels  17) spinach and bananas- high in potassium  -Provided lise of supplements that can help with hypertension:  1) magnesium: one high quality brand is Bioptemizers since it contains all 7 types of magnesium, otherwise over the counter magnesium gluconate '400mg'$  is a good option 2) B vitamins 3) vitamin D 4) potassium 5) CoQ10 6) L-arginine 7) Vitamin C 8) Beetroot -Educated that goal BP is 120/80. -Made goal to incorporate some of the above foods into diet.    1:20

## 2022-12-26 ENCOUNTER — Telehealth: Payer: Self-pay

## 2022-12-26 NOTE — Telephone Encounter (Signed)
Patient called in regarding refill on pregabalin, advised patient it was sent in 12/18/22, tried to call pharmacy but long hold times of over 30 minutes, patient needs to call pharmacy to see if they have Rx, tried calling patient back, no answer unable to leave VM.

## 2023-01-04 ENCOUNTER — Other Ambulatory Visit: Payer: Self-pay

## 2023-01-04 ENCOUNTER — Emergency Department (HOSPITAL_COMMUNITY)
Admission: EM | Admit: 2023-01-04 | Discharge: 2023-01-04 | Disposition: A | Discharge status: Home / Self Care | Payer: 59 | Attending: Emergency Medicine | Admitting: Emergency Medicine

## 2023-01-04 DIAGNOSIS — G20C Parkinsonism, unspecified: Secondary | ICD-10-CM | POA: Diagnosis not present

## 2023-01-04 DIAGNOSIS — R1084 Generalized abdominal pain: Secondary | ICD-10-CM

## 2023-01-04 DIAGNOSIS — K5900 Constipation, unspecified: Secondary | ICD-10-CM | POA: Insufficient documentation

## 2023-01-04 DIAGNOSIS — R109 Unspecified abdominal pain: Secondary | ICD-10-CM | POA: Diagnosis present

## 2023-01-04 NOTE — ED Triage Notes (Signed)
Pt now states the abd pain and constipation is resolved and now she is having frequent diarrhea for the last hour.

## 2023-01-04 NOTE — ED Provider Notes (Signed)
[facility] Provider Note   CSN: [id number] Arrival date & time: 01/04/23  1904     History  Chief Complaint  Patient presents with   Abdominal Pain    Kendra Simmons is a 72 y.o. female.   Abdominal Pain   72 year old female presents emergency department complaint of abdominal pain, nausea, constipation.  Patient reports intermittent episodes of constipation over the past 3 weeks.  States that she has been without bowel movement for the past few days.  While waiting to be evaluated in the triage area, patient has had 3 bowel movements with resolution of abdominal pain.  Upon initial presentation, patient is requesting to be discharged due to relief of symptoms.  Denies fever, chills, night sweats, current abdominal pain, nausea, vomiting, urinary symptoms.   Past medical history significant for anxiety, GERD, hypercholesterolemia, parkinsonian syndrome, AKI, MDD, insomnia  Home Medications Prior to Admission medications   Medication Sig Start Date End Date Taking? Authorizing Provider  acetaminophen (TYLENOL) 500 MG tablet Take 2 tablets (1,000 mg total) by mouth every 6 (six) hours as needed. 10/30/22   Jill Alexanders, PA-C  amitriptyline (ELAVIL) 150 MG tablet Take 1 tablet (150 mg total) by mouth at bedtime. 11/02/22   Raulkar, Clide Deutscher, MD  buPROPion (WELLBUTRIN XL) 150 MG 24 hr tablet TAKE 1 TABLET BY MOUTH EVERY DAY IN THE MORNING 11/05/22   Raulkar, Clide Deutscher, MD  Calcium Citrate-Vitamin D (CALCIUM CITRATE + D3 PO) Take 1 tablet by mouth daily.    [provider]  diclofenac Sodium (VOLTAREN) 1 % GEL APPLY 2 GRAMS TO AFFECTED AREA 4 TIMES A DAY Patient taking differently: Apply 2 g topically daily as needed (for pain). 06/25/22   Mcarthur Rossetti, MD  fluticasone (FLONASE) 50 MCG/ACT nasal spray Place 1 spray into both nostrils daily. 05/07/22   [provider]  hyoscyamine (ANASPAZ) 0.125 MG  TBDP disintergrating tablet Place 0.125 mg under the tongue daily as needed (Choking eposide per patient).    [provider]  memantine (NAMENDA) 10 MG tablet Take 1 tablet (10 mg total) by mouth 2 (two) times daily. 06/02/15   Rankin, Shuvon B, NP  methocarbamol (ROBAXIN) 500 MG tablet Take 1 tablet (500 mg total) by mouth every 6 (six) hours as needed for muscle spasms. 12/18/22   Raulkar, Clide Deutscher, MD  Multiple Vitamins-Minerals (MULTIVITAMIN PO) Take 1 tablet by mouth daily. Centrum 50 +    [provider]  nystatin (MYCOSTATIN/NYSTOP) powder Apply 1 Application topically 2 (two) times daily as needed (for dry skin). 12/06/21   [provider]  Omega-3 Fatty Acids (OMEGA-3 FISH OIL PO) Take 1,000 mg by mouth daily. MIni    [provider]  omeprazole (PRILOSEC) 40 MG capsule Take 40 mg by mouth daily.  09/06/16   [provider]  ondansetron (ZOFRAN ODT) 4 MG disintegrating tablet Take 1 tablet (4 mg total) by mouth every 8 (eight) hours as needed for nausea or vomiting. 02/24/20   Joy, Shawn C, PA-C  polycarbophil (FIBERCON) 625 MG tablet Take 625 mg by mouth daily. gummy    [provider]  polyethylene glycol (MIRALAX / GLYCOLAX) 17 g packet Take 17 g by mouth every other day.    [provider]  pregabalin (LYRICA) 100 MG capsule Take 1 capsule (100 mg total) by mouth at bedtime. 12/18/22   Izora Ribas, MD  Probiotic Product (PROBIOTIC PO) Take 1 tablet by mouth  daily. gummy    [provider]  QUEtiapine (SEROQUEL) 200 MG tablet Take 1 tablet (200 mg total) by mouth at bedtime. 12/18/22   Raulkar, Clide Deutscher, MD  QUEtiapine (SEROQUEL) 50 MG tablet Take 1 tablet (50 mg total) by mouth at bedtime. 12/18/22   Raulkar, Clide Deutscher, MD  rosuvastatin (CRESTOR) 10 MG tablet Take 1 tablet (10 mg total) by mouth daily. 11/02/22   Raulkar, Clide Deutscher, MD  sucralfate (CARAFATE) 1 GM/10ML suspension Take 10 mLs (1 g total) by mouth 4  (four) times daily -  with meals and at bedtime. Patient taking differently: Take 1 g by mouth daily as needed (stomach). 12/11/18   Black, Lezlie Octave, NP  tamsulosin (FLOMAX) 0.4 MG CAPS capsule Take 1 capsule (0.4 mg total) by mouth daily after supper. For frequent urination/urgency Patient taking differently: Take 0.4 mg by mouth daily after supper. 06/02/15   Rankin, Shuvon B, NP      Allergies    Phentermine and Augmentin [amoxicillin-pot clavulanate]    Review of Systems   Review of Systems  Gastrointestinal:  Positive for abdominal pain.  All other systems reviewed and are negative.   Physical Exam Updated Vital Signs BP 136/84 (BP Location: Left Arm)   Pulse (!) 101   Temp 97.7 F (36.5 C) (Oral)   Resp 19   Ht '5\' 4"'$  (1.626 m)   Wt 81.6 kg   SpO2 100%   BMI 30.90 kg/m  Physical Exam Vitals and nursing note reviewed.  Constitutional:      General: She is not in acute distress.    Appearance: She is well-developed.  HENT:     Head: Normocephalic and atraumatic.  Eyes:     Conjunctiva/sclera: Conjunctivae normal.  Cardiovascular:     Rate and Rhythm: Normal rate and regular rhythm.     Heart sounds: No murmur heard. Pulmonary:     Effort: Pulmonary effort is normal. No respiratory distress.     Breath sounds: Normal breath sounds.  Abdominal:     Palpations: Abdomen is soft.     Tenderness: There is no abdominal tenderness.  Musculoskeletal:        General: No swelling.     Cervical back: Neck supple.  Skin:    General: Skin is warm and dry.     Capillary Refill: Capillary refill takes less than 2 seconds.  Neurological:     Mental Status: She is alert.  Psychiatric:        Mood and Affect: Mood normal.     ED Results / Procedures / Treatments   Labs (all labs ordered are listed, but only abnormal results are displayed) Labs Reviewed - No data to display  EKG None  Radiology No results found.  Procedures Procedures    Medications Ordered in  ED Medications - No data to display  ED Course/ Medical Decision Making/ A&P                             Medical Decision Making  This patient presents to the ED for concern of abdominal pain, this involves an extensive number of treatment options, and is a complaint that carries with it a high risk of complications and morbidity.  The differential diagnosis includes mesenteric ischemia, AAA, SBO/LBO, constipation, gastroenteritis, nephrolithiasis, pyelonephritis, cystitis, appendicitis, CBD pathology, cholecystitis   Co morbidities that complicate the patient evaluation  See HPI   Additional history obtained:  Additional history  obtained from EMR External records from outside source obtained and reviewed including hospital records   Lab Tests:  Patient refused  Imaging Studies ordered:  Patient refused   Cardiac Monitoring: / EKG:  The patient was maintained on a cardiac monitor.  I personally viewed and interpreted the cardiac monitored which showed an underlying rhythm of: Sinus rhythm   Consultations Obtained:  N/a   Problem List / ED Course / Critical interventions / Medication management  Constipation Reevaluation of the patient showed that the patient resolved I have reviewed the patients home medicines and have made adjustments as needed   Social Determinants of Health:  Denies tobacco, drug use   Test / Admission - Considered:  Constipation/abdominal pain Vitals signs  within normal range and stable throughout visit. Patient with evidence of abdominal pain most likely secondary to constipation given complete relief of symptoms with invoking a bowel movement.  Patient states he has been without bed movement for the past 3 days but had 2 successful bowel movements while waiting to be seen in the triage area.  Upon initial presentation, patient without symptoms.  She is requesting be discharged and is declining labs and imaging studies at this time.   This deemed reasonable given resolution of symptoms with bowel movement.  Recommended therapy outpatient with increased fiber in diet, MiraLAX as needed to impact bowel movements in the future as well as maintaining proper oral hydration peer recommend follow-up with primary care for reassessment early next week.  Treatment plan discussed at length with patient and she acknowledged understanding was agreeable to said plan. Worrisome signs and symptoms were discussed with the patient, and the patient acknowledged understanding to return to the ED if noticed. Patient was stable upon discharge.          Final Clinical Impression(s) / ED Diagnoses Final diagnoses:  Constipation, unspecified constipation type  Generalized abdominal pain    Rx / DC Orders ED Discharge Orders     None         Wilnette Kales, [place] 01/04/23 2137    Cristie Hem, MD 01/04/23 2317

## 2023-01-04 NOTE — Discharge Instructions (Signed)
Note that if you change your mind, you are always welcome back to the emergency department for more thorough evaluation with labs and imaging studies.  At home, recommend taking MiraLAX as needed 1-2 capfuls at first to help keep your bowels to move.  Recommend continued oral hydration with water as well as eating foods high in fiber.  Recommend follow-up with primary care for reassessment of your symptoms.  Please do not hesitate to return to emergency department for worrisome signs and symptoms we discussed become apparent.

## 2023-01-04 NOTE — ED Triage Notes (Signed)
Pt BIB [facility] coming from home. Pt presents with a 3 hour hx of generalized abd pain and nausea with gradual worsening that started after eating. Pt reports associated constipation x 3 weeks. Pt took "nausea medication" PTA.   EMS Vitals  170/100 HR 94 SpO2 96% on R/A CBG 149

## 2023-01-10 ENCOUNTER — Telehealth: Payer: Self-pay | Admitting: Physical Medicine and Rehabilitation

## 2023-01-10 NOTE — Telephone Encounter (Signed)
Patient called and left a voicemail 1/25 @ 12:09pm requesting advice on what today she has been experiencing blurred vision and has notice it happens with lyrica

## 2023-01-15 ENCOUNTER — Telehealth: Payer: Self-pay | Admitting: Physical Medicine and Rehabilitation

## 2023-01-15 NOTE — Telephone Encounter (Signed)
Patient is experiencing blurred vision and needs to speak to physician for medical advice

## 2023-01-16 ENCOUNTER — Encounter (HOSPITAL_BASED_OUTPATIENT_CLINIC_OR_DEPARTMENT_OTHER): Payer: 59 | Admitting: Physical Medicine and Rehabilitation

## 2023-01-16 DIAGNOSIS — H538 Other visual disturbances: Secondary | ICD-10-CM

## 2023-01-16 MED ORDER — PREGABALIN 75 MG PO CAPS: 75.0000 mg | ORAL_CAPSULE | Freq: Every evening | ORAL | 3 refills | Status: DC

## 2023-01-16 MED ORDER — TAMSULOSIN HCL 0.4 MG PO CAPS: 0.4000 mg | ORAL_CAPSULE | Freq: Every day | ORAL | 0 refills | Status: DC

## 2023-01-17 NOTE — Progress Notes (Signed)
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 1) prunes- contain high amounts of fiber.  2) apples- has a form of dietary fiber called pectin that accelerates stool movement and increases beneficial gut bacteria 3) pears- in addition to fiber, also high in fructose and sorbitol which have laxative effect 4) figs- contain an enzyme ficin which helps to speed colonic transit 5) kiwis- contain an enzyme actinidin that improves gut motility and reduces constipation 6) oranges- rich in pectin (like apples) 7) grapefruits- contain a flavanol naringenin which has a laxative effect 8) vegetables- rich in fiber and also great sources of folate, vitamin C, and K 9) artichoke- high in inulin, prebiotic great for the microbiome 10) chicory- increases stool frequency and softness (can be added to coffee) 11) rhubarb- laxative effect 12) sweet potato- high fiber 13) beans, peas, and lentils- contain both soluble and insoluble fiber 14) chia seeds- improves intestinal health and gut flora 15) flaxseeds-  laxative effect 16) whole grain rye bread- high in fiber 17) oat bran- high in soluble and insoluble fiber 18) kefir- softens stools -recommended to try at least one of these foods every day.  -drink 6-8 glasses of water per day -walk regularly, especially after meals.     20) HTN: -BP is 143/82 today.  -Advised checking BP daily at home and logging results to bring into follow-up appointment with PCP and myself. -Reviewed BP meds today.  -Advised regarding healthy foods that can help lower blood pressure and provided with a list: 1) citrus foods- high in vitamins and minerals 2) salmon and other fatty fish - reduces inflammation and oxylipins 3) swiss chard (leafy green)- high level of nitrates 4) pumpkin seeds- one of the best natural sources of magnesium 5) Beans and lentils- high in fiber, magnesium, and potassium 6) Berries- high in flavonoids 7) Amaranth (whole grain, can be cooked similarly to rice and oats)- high in magnesium and fiber 8) Pistachios- even more effective at reducing BP than other nuts 9) Carrots- high in phenolic compounds that relax blood vessels and reduce inflammation 10) Celery- contain phthalides that relax tissues of arterial walls 11) Tomatoes- can also improve cholesterol and reduce risk of heart disease 12) Broccoli- good source of magnesium, calcium, and potassium 13) Greek yogurt: high in potassium and calcium 14) Herbs and spices: Celery seed, cilantro, saffron, lemongrass, black cumin, ginseng, cinnamon, cardamom, sweet basil, and ginger 15) Chia and flax seeds- also help to lower cholesterol and blood sugar 16) Beets- high levels of nitrates that relax blood vessels  17) spinach and bananas- high in potassium  -Provided lise of supplements that can help with hypertension:  1) magnesium: one high quality brand is Bioptemizers since it contains all 7 types of magnesium, otherwise over the counter magnesium gluconate '400mg'$  is a good option 2) B  vitamins 3) vitamin D 4) potassium  5) CoQ10 6) L-arginine 7) Vitamin C 8) Beetroot -Educated that goal BP is 120/80. -Made goal to incorporate some of the above foods into diet.    21) Blurry vision -advised to decrease Lyrica to '75mg'$  HS and to let me know if vision continues to be blurry  7 minutes spent in discussion of her blurred vision, her concerns it may be due to her current batch of lyrica, advised switching to the '75mg'$  tablets that she already has at home

## 2023-01-24 ENCOUNTER — Other Ambulatory Visit: Payer: Self-pay | Admitting: Gastroenterology

## 2023-01-24 ENCOUNTER — Other Ambulatory Visit: Payer: Self-pay | Admitting: Physical Medicine and Rehabilitation

## 2023-01-24 DIAGNOSIS — R1310 Dysphagia, unspecified: Secondary | ICD-10-CM

## 2023-01-26 ENCOUNTER — Ambulatory Visit
Admission: EM | Admit: 2023-01-26 | Discharge: 2023-01-26 | Disposition: A | Discharge status: Home / Self Care | Payer: 59 | Attending: Internal Medicine | Admitting: Internal Medicine

## 2023-01-26 DIAGNOSIS — H01001 Unspecified blepharitis right upper eyelid: Secondary | ICD-10-CM | POA: Diagnosis not present

## 2023-01-26 MED ORDER — ERYTHROMYCIN 5 MG/GM OP OINT: TOPICAL_OINTMENT | OPHTHALMIC | 0 refills | Status: DC

## 2023-01-26 NOTE — ED Provider Notes (Signed)
[facility]    CSN: [id number] Arrival date & time: 01/26/23  1212      History   Chief Complaint Chief Complaint  Patient presents with   Eye Problem    HPI Kendra Simmons is a 72 y.o. female.   Patient presents with right upper eyelid irritation and drainage that started this morning upon awakening.  Patient reports that she has a history of blepharitis and this feels consistent with this.  Denies trauma or foreign body to the eye.  Denies any changes in vision.  Patient reports that it is a sensation of "a rock in it" which is typical for her history of blepharitis.  She does not wear contacts or glasses.   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   Sepsis without acute organ dysfunction ([place])    Regurgitation of stomach contents    Esophageal dysphagia    Community acquired pneumonia of right upper lobe of lung 12/08/2018   Chronic bilateral low back pain 07/01/2017   Dehydration 07/03/2015   UTI (lower urinary tract infection) 07/03/2015   Hypokalemia 07/03/2015   AKI (acute kidney injury) ([place]) 07/03/2015   Tachycardia 05/27/2015   MDD (major depressive disorder), recurrent, severe, 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   Mild neurocognitive disorder 05/25/2015   Insomnia disorder, with non-sleep disorder mental comorbidity, 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   Snoring 12/24/2014   Unintended weight loss 12/24/2014    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   Panic attacks 07/26/2014   Thrombocytopenia ([place]) 12/17/2012   DYSLIPIDEMIA 03/13/2010   GERD 03/13/2010    Past Surgical History:  Procedure Laterality Date   BOTOX INJECTION N/A 10/17/2021   Procedure: BOTOX INJECTION;  Surgeon: Clarene Essex, MD;  Location: [facility];  Service: Endoscopy;  Laterality: N/A;   CESAREAN SECTION     COSMETIC SURGERY     ESOPHAGEAL MANOMETRY N/A 09/26/2016   Procedure: ESOPHAGEAL MANOMETRY (EM);  Surgeon: Clarene Essex, MD;  Location: [facility];  Service: Endoscopy;  Laterality: N/A;   ESOPHAGOGASTRODUODENOSCOPY (EGD) WITH PROPOFOL N/A 10/17/2021   Procedure: ESOPHAGOGASTRODUODENOSCOPY (EGD) WITH PROPOFOL;  Surgeon: Clarene Essex, MD;  Location: [facility];  Service: Endoscopy;  Laterality: N/A;   laproscopy      OB History   No obstetric history on file.      Home Medications    Prior to Admission medications   Medication Sig Start Date End Date Taking? Authorizing Provider  erythromycin ophthalmic ointment Place a 1/2 inch ribbon of ointment into affected eye daily . 01/26/23  Yes , Hildred Alamin E, FNP  acetaminophen (TYLENOL) 500 MG tablet Take 2 tablets (1,000 mg total) by mouth every 6 (six) hours as needed. 10/30/22   Jill Alexanders, PA-C  amitriptyline (ELAVIL) 150 MG tablet Take 1 tablet (150 mg total) by mouth at bedtime. 11/02/22   Raulkar, Clide Deutscher, MD  buPROPion (WELLBUTRIN XL) 150 MG 24 hr tablet TAKE 1  TABLET BY MOUTH EVERY DAY IN THE MORNING 11/05/22   Raulkar, Clide Deutscher, MD  Calcium Citrate-Vitamin D (CALCIUM CITRATE + D3 PO) Take 1 tablet by mouth daily.    [provider]  diclofenac Sodium (VOLTAREN) 1 % GEL APPLY 2 GRAMS TO AFFECTED AREA 4 TIMES A DAY Patient taking differently: Apply 2 g topically daily as needed (for pain). 06/25/22   Mcarthur Rossetti, MD  fluticasone (FLONASE) 50 MCG/ACT nasal spray Place 1 spray into both nostrils daily. 05/07/22   [provider]  hyoscyamine (ANASPAZ) 0.125 MG TBDP disintergrating tablet Place 0.125 mg under the tongue daily as needed (Choking eposide per patient).    [provider]  memantine (NAMENDA) 10 MG tablet Take 1 tablet (10 mg total) by mouth 2 (two) times daily. 06/02/15   Rankin, Shuvon B, NP  methocarbamol (ROBAXIN) 500 MG tablet Take 1 tablet (500 mg total) by mouth every 6 (six) hours as needed for muscle spasms. 12/18/22   Raulkar, Clide Deutscher, MD  Multiple Vitamins-Minerals (MULTIVITAMIN PO) Take 1 tablet by mouth daily. Centrum 50 +    [provider]  nystatin (MYCOSTATIN/NYSTOP) powder Apply 1 Application topically 2 (two) times daily as needed (for dry skin). 12/06/21   [provider]  Omega-3 Fatty Acids (OMEGA-3 FISH OIL PO) Take 1,000 mg by mouth daily. MIni    [provider]  omeprazole (PRILOSEC) 40 MG capsule Take 40 mg by mouth daily.  09/06/16   [provider]  ondansetron (ZOFRAN ODT) 4 MG disintegrating tablet Take 1 tablet (4 mg total) by mouth every 8 (eight) hours as needed for nausea or vomiting. 02/24/20   Joy, Shawn C, PA-C  polycarbophil (FIBERCON) 625 MG tablet Take 625 mg by mouth daily. gummy    [provider]  polyethylene glycol (MIRALAX / GLYCOLAX) 17 g packet Take 17 g by mouth every other day.    [provider]  pregabalin (LYRICA) 75 MG capsule Take 1 capsule (75 mg total) by mouth at bedtime. 01/16/23   Izora Ribas, MD  Probiotic Product (PROBIOTIC PO) Take 1 tablet by mouth daily. gummy    [provider]  QUEtiapine (SEROQUEL) 200 MG tablet Take 1 tablet (200 mg total) by mouth at bedtime. 12/18/22   Raulkar, Clide Deutscher, MD  QUEtiapine (SEROQUEL) 50 MG tablet Take 1 tablet (50 mg total) by mouth at bedtime. 12/18/22   Raulkar, Clide Deutscher, MD  rosuvastatin (CRESTOR) 10 MG tablet Take 1 tablet (10 mg total) by mouth daily. 11/02/22   Raulkar, Clide Deutscher, MD  sucralfate (CARAFATE) 1 GM/10ML  suspension Take 10 mLs (1 g total) by mouth 4 (four) times daily -  with meals and at bedtime. Patient taking differently: Take 1 g by mouth daily as needed (stomach). 12/11/18   Black, Lezlie Octave, NP  tamsulosin (FLOMAX) 0.4 MG CAPS capsule Take 1 capsule (0.4 mg total) by mouth daily after supper. For frequent urination/urgency 01/16/23   Raulkar, Clide Deutscher, MD    Family History Family History  Problem Relation Age of Onset   Cancer Mother    Diabetes Mother    Sleep apnea Father    Alcohol abuse Father    Diabetes Sister    Diabetes Maternal Uncle    Diabetes Cousin     Social History Social History   Tobacco Use   Smoking status: Never   Smokeless tobacco: Never  Vaping Use   Vaping Use: Never used  Substance Use Topics  Alcohol use: No    Alcohol/week: 0.0 standard drinks of alcohol    Comment: zero   Drug use: No    Types: Barbituates, Benzodiazepines    Comment: Denies any drug use other than benzos     Allergies   Phentermine and Augmentin [amoxicillin-pot clavulanate]   Review of Systems Review of Systems Per HPI  Physical Exam Triage Vital Signs ED Triage Vitals  Enc Vitals Group     BP 01/26/23 1453 (!) 151/87     Pulse Rate 01/26/23 1453 95     Resp 01/26/23 1453 15     Temp 01/26/23 1453 97.6 F (36.4 C)     Temp src --      SpO2 01/26/23 1453 98 %     Weight --      Height --      Head Circumference --      Peak Flow --      Pain Score 01/26/23 1452 6     Pain Loc --      Pain Edu? --      Excl. in [place]? --    No data found.  Updated Vital Signs BP (!) 151/87   Pulse 95   Temp 97.6 F (36.4 C)   Resp 15   SpO2 98%   Visual Acuity Right Eye Distance:   Left Eye Distance:   Bilateral Distance:    Right Eye Near:   Left Eye Near:    Bilateral Near:     Physical Exam Constitutional:      General: She is not in acute distress.    Appearance: Normal appearance. She is not toxic-appearing or diaphoretic.  HENT:     Head:  Normocephalic and atraumatic.  Eyes:     General: Lids are normal. Lids are everted, no foreign bodies appreciated. Vision grossly intact. Gaze aligned appropriately.     Extraocular Movements: Extraocular movements intact.     Conjunctiva/sclera: Conjunctivae normal.     Pupils: Pupils are equal, round, and reactive to light.     Comments: There is no obvious abnormality to the eyelid, lashes, eyeball.  No drainage noted.  Pulmonary:     Effort: Pulmonary effort is normal.  Neurological:     General: No focal deficit present.     Mental Status: She is alert and oriented to person, place, and time. Mental status is at baseline.  Psychiatric:        Mood and Affect: Mood normal.        Behavior: Behavior normal.        Thought Content: Thought content normal.        Judgment: Judgment normal.      UC Treatments / Results  Labs (all labs ordered are listed, but only abnormal results are displayed) Labs Reviewed - No data to display  EKG   Radiology No results found.  Procedures Procedures (including critical care time)  Medications Ordered in UC Medications - No data to display  Initial Impression / Assessment and Plan / UC Course  I have reviewed the triage vital signs and the nursing notes.  Pertinent labs & imaging results that were available during my care of the patient were reviewed by me and considered in my medical decision making (see chart for details).     There is no obvious abnormality to the eye especially anything that would be definitive for blepharitis.  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 Therefore, will treat with erythromycin ointment.  Also discussed supportive care including lid massage, lid washing, warm compresses with patient.  Suggested fluorescein stain with patient given no obvious abnormalities noted but she declined this.  Advised patient to follow-up with provided  contact information for ophthalmology for further evaluation and management. Visual acuity appears normal.  Discussed return precautions.  Patient verbalized understanding and was agreeable with plan. Final Clinical Impressions(s) / UC Diagnoses   Final diagnoses:  Blepharitis of right upper eyelid, unspecified type     Discharge Instructions      I have prescribed antibiotic ointment to apply to the eye.  Follow-up with eye doctor on Monday to schedule an appointment for further evaluation.    ED Prescriptions     Medication Sig Dispense Auth. Provider   erythromycin ophthalmic ointment Place a 1/2 inch ribbon of ointment into affected eye daily . 3.5 g Teodora Medici, [place]      PDMP not reviewed this encounter.   Teodora Medici, [place] 01/26/23 [id number]

## 2023-01-26 NOTE — Discharge Instructions (Signed)
I have prescribed antibiotic ointment to apply to the eye.  Follow-up with eye doctor on Monday to schedule an appointment for further evaluation.

## 2023-01-26 NOTE — ED Triage Notes (Signed)
Pt presents to uc with co of drainage from right eye wit the sensation of a rock in it. Pt reports she has a hx of blepharitis and she thinks this is what it is.

## 2023-01-28 ENCOUNTER — Telehealth: Payer: Self-pay | Admitting: Physical Medicine and Rehabilitation

## 2023-01-28 NOTE — Telephone Encounter (Signed)
Patient continues to experience blurry vision and would like to speak to physician  Patient requesting medication refill  did not catch medication name on voicemail

## 2023-01-30 ENCOUNTER — Encounter: Payer: Self-pay | Admitting: Diagnostic Neuroimaging

## 2023-01-30 ENCOUNTER — Ambulatory Visit: Payer: 59 | Admitting: Diagnostic Neuroimaging

## 2023-02-04 ENCOUNTER — Encounter: Payer: 59 | Admitting: Physical Medicine and Rehabilitation

## 2023-02-08 ENCOUNTER — Other Ambulatory Visit: Payer: 59

## 2023-02-13 ENCOUNTER — Other Ambulatory Visit: Payer: Self-pay | Admitting: Physical Medicine and Rehabilitation

## 2023-02-19 ENCOUNTER — Encounter: Payer: 59 | Attending: Physical Medicine and Rehabilitation | Admitting: Physical Medicine and Rehabilitation

## 2023-02-19 VITALS — BP 147/87 | HR 97 | Ht 64.0 in | Wt 184.4 lb

## 2023-02-19 DIAGNOSIS — F411 Generalized anxiety disorder: Secondary | ICD-10-CM | POA: Insufficient documentation

## 2023-02-19 DIAGNOSIS — H538 Other visual disturbances: Secondary | ICD-10-CM | POA: Diagnosis present

## 2023-02-19 DIAGNOSIS — M7062 Trochanteric bursitis, left hip: Secondary | ICD-10-CM | POA: Insufficient documentation

## 2023-02-19 DIAGNOSIS — M7061 Trochanteric bursitis, right hip: Secondary | ICD-10-CM | POA: Diagnosis not present

## 2023-02-19 DIAGNOSIS — G4701 Insomnia due to medical condition: Secondary | ICD-10-CM | POA: Insufficient documentation

## 2023-02-19 MED ORDER — LIDOCAINE HCL 1 % IJ SOLN
8.0000 mL | Freq: Once | INTRAMUSCULAR | Status: AC
  Administered 2023-02-19: 8 mL

## 2023-02-19 MED ORDER — BUSPIRONE HCL 5 MG PO TABS: 5.0000 mg | ORAL_TABLET | Freq: Every day | ORAL | 3 refills | Status: DC | PRN

## 2023-02-19 MED ORDER — BETAMETHASONE SOD PHOS & ACET 6 (3-3) MG/ML IJ SUSP
24.0000 mg | Freq: Once | INTRAMUSCULAR | Status: AC
  Administered 2023-02-19: 24 mg via INTRAMUSCULAR

## 2023-02-19 MED ORDER — AMITRIPTYLINE HCL 150 MG PO TABS: 150.0000 mg | ORAL_TABLET | Freq: Every day | ORAL | 3 refills | Status: DC
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
## 2023-02-20 ENCOUNTER — Telehealth: Payer: Self-pay

## 2023-02-20 ENCOUNTER — Telehealth: Payer: Self-pay | Admitting: *Deleted

## 2023-02-20 MED ORDER — NURTEC 75 MG PO TBDP: 1.0000 | ORAL_TABLET | ORAL | 3 refills | Status: DC

## 2023-02-20 NOTE — Telephone Encounter (Signed)
Nurtec '75MG'$  dispersible tablets Status: PA Response - ApprovedCreated: March 6th, 2024 [phone]

## 2023-02-20 NOTE — Telephone Encounter (Signed)
Patient states that she didn't really get a chance to talk to you yesterday but she is having visual problems with a headache while watching tv, and you believe it may be related to migraines and has gotten worse over the last week, it was really bad last night and really bad this morning as well, would like to know what she needs to do.

## 2023-02-21 ENCOUNTER — Telehealth: Payer: Self-pay | Admitting: Physical Medicine and Rehabilitation

## 2023-02-21 NOTE — Telephone Encounter (Signed)
Patient called pharmacy told her Nurtec needed a prior auth.

## 2023-02-21 NOTE — Telephone Encounter (Signed)
Kendra Simmons (Key: [id number]) [id number] Nurtec '75MG'$  dispersible tablets Status: PA Response - Approved 02/20/23  Attempted to call patient no ans.

## 2023-03-04 ENCOUNTER — Other Ambulatory Visit: Payer: Self-pay | Admitting: *Deleted

## 2023-03-06 ENCOUNTER — Telehealth: Payer: Self-pay | Admitting: Physical Medicine and Rehabilitation

## 2023-03-06 NOTE — Telephone Encounter (Signed)
Patient is requesting refill on Lyrica and antibiotic for possible sinuses.

## 2023-03-07 ENCOUNTER — Other Ambulatory Visit: Payer: Self-pay | Admitting: Physical Medicine and Rehabilitation

## 2023-03-07 MED ORDER — PREGABALIN 75 MG PO CAPS: 75.0000 mg | ORAL_CAPSULE | Freq: Every evening | ORAL | 3 refills | Status: DC

## 2023-03-12 ENCOUNTER — Other Ambulatory Visit: Payer: Self-pay | Admitting: *Deleted

## 2023-03-12 MED ORDER — PREGABALIN 75 MG PO CAPS: 75.0000 mg | ORAL_CAPSULE | Freq: Every evening | ORAL | 3 refills | Status: DC

## 2023-03-12 NOTE — Telephone Encounter (Signed)
Patient is requesting a callback from [place] @ [phone].

## 2023-03-14 ENCOUNTER — Telehealth: Payer: Self-pay

## 2023-03-14 ENCOUNTER — Encounter (HOSPITAL_BASED_OUTPATIENT_CLINIC_OR_DEPARTMENT_OTHER): Payer: 59 | Admitting: Physical Medicine and Rehabilitation

## 2023-03-14 DIAGNOSIS — F411 Generalized anxiety disorder: Secondary | ICD-10-CM

## 2023-03-14 MED ORDER — CLONAZEPAM 0.5 MG PO TABS: 0.5000 mg | ORAL_TABLET | Freq: Every evening | ORAL | 5 refills | Status: DC | PRN

## 2023-03-14 NOTE — Telephone Encounter (Signed)
Patient states that Clonzepam was suppose to be called in for her but pharmacy states they don't have the prescription.

## 2023-03-14 NOTE — Telephone Encounter (Signed)
Kendra Simmons has requested a call back about a Scientist, water quality.  Call back phone [phone].
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
## 2023-05-02 ENCOUNTER — Encounter (HOSPITAL_COMMUNITY): Payer: Self-pay

## 2023-05-02 ENCOUNTER — Inpatient Hospital Stay (HOSPITAL_COMMUNITY)
Admission: EM | Admit: 2023-05-02 | Discharge: 2023-05-05 | DRG: 312 | Disposition: A | Discharge status: Home Health | Payer: 59 | Attending: Internal Medicine | Admitting: Internal Medicine

## 2023-05-02 ENCOUNTER — Emergency Department (HOSPITAL_COMMUNITY): Payer: 59

## 2023-05-02 DIAGNOSIS — N1832 Chronic kidney disease, stage 3b: Secondary | ICD-10-CM | POA: Diagnosis present

## 2023-05-02 DIAGNOSIS — R531 Weakness: Secondary | ICD-10-CM | POA: Diagnosis not present

## 2023-05-02 DIAGNOSIS — N179 Acute kidney failure, unspecified: Secondary | ICD-10-CM | POA: Diagnosis present

## 2023-05-02 DIAGNOSIS — Z811 Family history of alcohol abuse and dependence: Secondary | ICD-10-CM

## 2023-05-02 DIAGNOSIS — K219 Gastro-esophageal reflux disease without esophagitis: Secondary | ICD-10-CM | POA: Diagnosis present

## 2023-05-02 DIAGNOSIS — Z791 Long term (current) use of non-steroidal anti-inflammatories (NSAID): Secondary | ICD-10-CM | POA: Diagnosis not present

## 2023-05-02 DIAGNOSIS — T43595A Adverse effect of other antipsychotics and neuroleptics, initial encounter: Secondary | ICD-10-CM | POA: Diagnosis present

## 2023-05-02 DIAGNOSIS — Z881 Allergy status to other antibiotic agents status: Secondary | ICD-10-CM

## 2023-05-02 DIAGNOSIS — Z79899 Other long term (current) drug therapy: Secondary | ICD-10-CM | POA: Diagnosis not present

## 2023-05-02 DIAGNOSIS — R296 Repeated falls: Secondary | ICD-10-CM | POA: Diagnosis present

## 2023-05-02 DIAGNOSIS — N3289 Other specified disorders of bladder: Secondary | ICD-10-CM | POA: Diagnosis not present

## 2023-05-02 DIAGNOSIS — T424X5A Adverse effect of benzodiazepines, initial encounter: Secondary | ICD-10-CM | POA: Diagnosis present

## 2023-05-02 DIAGNOSIS — I129 Hypertensive chronic kidney disease with stage 1 through stage 4 chronic kidney disease, or unspecified chronic kidney disease: Secondary | ICD-10-CM | POA: Diagnosis present

## 2023-05-02 DIAGNOSIS — F03A3 Unspecified dementia, mild, with mood disturbance: Secondary | ICD-10-CM | POA: Diagnosis present

## 2023-05-02 DIAGNOSIS — Z888 Allergy status to other drugs, medicaments and biological substances status: Secondary | ICD-10-CM | POA: Diagnosis not present

## 2023-05-02 DIAGNOSIS — I951 Orthostatic hypotension: Secondary | ICD-10-CM | POA: Diagnosis present

## 2023-05-02 DIAGNOSIS — N390 Urinary tract infection, site not specified: Secondary | ICD-10-CM | POA: Diagnosis present

## 2023-05-02 DIAGNOSIS — F32A Depression, unspecified: Secondary | ICD-10-CM | POA: Diagnosis present

## 2023-05-02 DIAGNOSIS — R55 Syncope and collapse: Secondary | ICD-10-CM | POA: Diagnosis present

## 2023-05-02 DIAGNOSIS — E872 Acidosis, unspecified: Secondary | ICD-10-CM | POA: Diagnosis present

## 2023-05-02 DIAGNOSIS — Z809 Family history of malignant neoplasm, unspecified: Secondary | ICD-10-CM

## 2023-05-02 DIAGNOSIS — E860 Dehydration: Secondary | ICD-10-CM | POA: Diagnosis present

## 2023-05-02 DIAGNOSIS — M1990 Unspecified osteoarthritis, unspecified site: Secondary | ICD-10-CM | POA: Diagnosis present

## 2023-05-02 DIAGNOSIS — Z833 Family history of diabetes mellitus: Secondary | ICD-10-CM

## 2023-05-02 DIAGNOSIS — T446X5A Adverse effect of alpha-adrenoreceptor antagonists, initial encounter: Secondary | ICD-10-CM | POA: Diagnosis present

## 2023-05-02 DIAGNOSIS — E7800 Pure hypercholesterolemia, unspecified: Secondary | ICD-10-CM | POA: Diagnosis present

## 2023-05-02 DIAGNOSIS — F03A4 Unspecified dementia, mild, with anxiety: Secondary | ICD-10-CM | POA: Diagnosis present

## 2023-05-02 LAB — CBC WITH DIFFERENTIAL/PLATELET
Abs Immature Granulocytes: 0.03 10*3/uL (ref 0.00–0.07)
Basophils Absolute: 0 10*3/uL (ref 0.0–0.1)
Basophils Relative: 1 %
Eosinophils Absolute: 0.1 10*3/uL (ref 0.0–0.5)
Eosinophils Relative: 1 %
HCT: 40.7 % (ref 36.0–46.0)
Hemoglobin: 12.8 g/dL (ref 12.0–15.0)
Immature Granulocytes: 1 %
Lymphocytes Relative: 27 %
Lymphs Abs: 1.5 10*3/uL (ref 0.7–4.0)
MCH: 31.3 pg (ref 26.0–34.0)
MCHC: 31.4 g/dL (ref 30.0–36.0)
MCV: 99.5 fL (ref 80.0–100.0)
Monocytes Absolute: 0.4 10*3/uL (ref 0.1–1.0)
Monocytes Relative: 8 %
Neutro Abs: 3.4 10*3/uL (ref 1.7–7.7)
Neutrophils Relative %: 62 %
Platelets: 205 10*3/uL (ref 150–400)
RBC: 4.09 MIL/uL (ref 3.87–5.11)
RDW: 13.2 % (ref 11.5–15.5)
WBC: 5.4 10*3/uL (ref 4.0–10.5)
nRBC: 0 % (ref 0.0–0.2)

## 2023-05-02 LAB — COMPREHENSIVE METABOLIC PANEL
ALT: 19 U/L (ref 0–44)
AST: 25 U/L (ref 15–41)
Albumin: 4 g/dL (ref 3.5–5.0)
Alkaline Phosphatase: 74 U/L (ref 38–126)
Anion gap: 13 (ref 5–15)
BUN: 31 mg/dL — ABNORMAL HIGH (ref 8–23)
CO2: 19 mmol/L — ABNORMAL LOW (ref 22–32)
Calcium: 9.3 mg/dL (ref 8.9–10.3)
Chloride: 104 mmol/L (ref 98–111)
Creatinine, Ser: 1.37 mg/dL — ABNORMAL HIGH (ref 0.44–1.00)
GFR, Estimated: 41 mL/min — ABNORMAL LOW (ref >60)
Glucose, Bld: 154 mg/dL — ABNORMAL HIGH (ref 70–99)
Potassium: 4.3 mmol/L (ref 3.5–5.1)
Sodium: 136 mmol/L (ref 135–145)
Total Bilirubin: 0.5 mg/dL (ref 0.3–1.2)
Total Protein: 7.1 g/dL (ref 6.5–8.1)

## 2023-05-02 LAB — CBC
HCT: 38.5 % (ref 36.0–46.0)
Hemoglobin: 12.2 g/dL (ref 12.0–15.0)
MCH: 30.7 pg (ref 26.0–34.0)
MCHC: 31.7 g/dL (ref 30.0–36.0)
MCV: 97 fL (ref 80.0–100.0)
Platelets: 148 10*3/uL — ABNORMAL LOW (ref 150–400)
RBC: 3.97 MIL/uL (ref 3.87–5.11)
RDW: 13.1 % (ref 11.5–15.5)
WBC: 4.7 10*3/uL (ref 4.0–10.5)
nRBC: 0 % (ref 0.0–0.2)

## 2023-05-02 LAB — TROPONIN I (HIGH SENSITIVITY)
Troponin I (High Sensitivity): 3 ng/L (ref <18)
Troponin I (High Sensitivity): 3 ng/L (ref <18)

## 2023-05-02 LAB — CBG MONITORING, ED: Glucose-Capillary: 88 mg/dL (ref 70–99)

## 2023-05-02 MED ORDER — LACTATED RINGERS IV BOLUS
1000.0000 mL | Freq: Once | INTRAVENOUS | Status: AC
  Administered 2023-05-02: 1000 mL via INTRAVENOUS

## 2023-05-02 MED ORDER — ROSUVASTATIN CALCIUM 5 MG PO TABS
10.0000 mg | ORAL_TABLET | Freq: Every day | ORAL | Status: DC
  Administered 2023-05-03 – 2023-05-05 (×3): 10 mg via ORAL
  Filled 2023-05-02 (×3): qty 2

## 2023-05-02 MED ORDER — PANTOPRAZOLE SODIUM 40 MG PO TBEC
40.0000 mg | DELAYED_RELEASE_TABLET | Freq: Every day | ORAL | Status: DC
  Administered 2023-05-03 – 2023-05-05 (×3): 40 mg via ORAL
  Filled 2023-05-02 (×3): qty 1

## 2023-05-02 MED ORDER — LACTATED RINGERS IV SOLN: INTRAVENOUS | Status: AC

## 2023-05-02 MED ORDER — PROCHLORPERAZINE EDISYLATE 10 MG/2ML IJ SOLN: 5.0000 mg | Freq: Four times a day (QID) | INTRAMUSCULAR | Status: DC | PRN

## 2023-05-02 MED ORDER — POLYETHYLENE GLYCOL 3350 17 G PO PACK: 17.0000 g | PACK | Freq: Every day | ORAL | Status: DC | PRN

## 2023-05-02 MED ORDER — TAMSULOSIN HCL 0.4 MG PO CAPS: 0.4000 mg | ORAL_CAPSULE | Freq: Every day | ORAL | Status: DC

## 2023-05-02 MED ORDER — ENOXAPARIN SODIUM 40 MG/0.4ML IJ SOSY
40.0000 mg | PREFILLED_SYRINGE | INTRAMUSCULAR | Status: DC
  Administered 2023-05-03 – 2023-05-05 (×3): 40 mg via SUBCUTANEOUS
  Filled 2023-05-02 (×3): qty 0.4

## 2023-05-02 MED ORDER — ACETAMINOPHEN 325 MG PO TABS
650.0000 mg | ORAL_TABLET | Freq: Four times a day (QID) | ORAL | Status: DC | PRN
  Administered 2023-05-03 – 2023-05-05 (×6): 650 mg via ORAL
  Filled 2023-05-02 (×6): qty 2

## 2023-05-02 NOTE — H&P (Signed)
History and Physical  Kendra Simmons WUJ:[id number] DOB: [birth year] DOA: 05/02/2023  Referring physician: Dr. Earlene Plater, Resident, EDP  PCP: Aliene Beams, MD  Outpatient Specialists: PMR, behavioral health. Patient coming from: Home, lives alone.  Chief Complaint: Falls and generalized weakness  HPI: Kendra Simmons is a 72 y.o. female with medical history significant for generalized anxiety/depression, obesity, GERD, hyperlipidemia, who presented from home due to frequent falls, fell twice today and fell more than 5 times the past week.  The patient is on Klonopin for anxiety, she tried to reduce the dose due to the falls.  EMS was activated.  She was brought into the ED for further evaluation.  In the ED, CT head and neck were nonacute.  UA is positive for pyuria.  No active cardiopulmonary disease on chest x-ray.  The patient received 1 L IV fluid bolus LR.  TRH, hospitalist service, was asked to admit.  ED Course: Temperature 97.8.  BP 125/73, pulse 77, respiratory 16, saturation 98% on room air.  Lab studies remarkable for serum bicarb 19, glucose 154, BUN 31, creatinine 1.37.  GFR 41.  High-sensitivity troponin 3, repeat 3.  CBC essentially unremarkable.  Review of Systems: Review of systems as noted in the HPI. All other systems reviewed and are negative.   Past Medical History:  Diagnosis Date   Anxiety    Arthritis    Depression    Emotional depression 02/04/2015   Frequent falls    GERD (gastroesophageal reflux disease)    High cholesterol    Insomnia    Memory loss    Transient alteration of awareness    Past Surgical History:  Procedure Laterality Date   BOTOX INJECTION N/A 10/17/2021   Procedure: BOTOX INJECTION;  Surgeon: Vida Rigger, MD;  Location: [facility];  Service: Endoscopy;  Laterality: N/A;   CESAREAN SECTION     COSMETIC SURGERY     ESOPHAGEAL MANOMETRY N/A 09/26/2016   Procedure: ESOPHAGEAL MANOMETRY (EM);  Surgeon: Vida Rigger, MD;   Location: [facility];  Service: Endoscopy;  Laterality: N/A;   ESOPHAGOGASTRODUODENOSCOPY (EGD) WITH PROPOFOL N/A 10/17/2021   Procedure: ESOPHAGOGASTRODUODENOSCOPY (EGD) WITH PROPOFOL;  Surgeon: Vida Rigger, MD;  Location: [facility];  Service: Endoscopy;  Laterality: N/A;   laproscopy      Social History:  reports that she has never smoked. She has never used smokeless tobacco. She reports that she does not drink alcohol and does not use drugs.   Allergies  Allergen Reactions   Phentermine Anxiety   Augmentin [Amoxicillin-Pot Clavulanate] Diarrhea    Family History  Problem Relation Age of Onset   Cancer Mother    Diabetes Mother    Sleep apnea Father    Alcohol abuse Father    Diabetes Sister    Diabetes Maternal Uncle    Diabetes Cousin       Prior to Admission medications   Medication Sig Start Date End Date Taking? Authorizing Provider  ACCU-CHEK GUIDE test strip  11/28/22   [provider]  Accu-Chek Softclix Lancets lancets as directed for 90 days 11/28/22   [provider]  acetaminophen (TYLENOL) 500 MG tablet Take 2 tablets (1,000 mg total) by mouth every 6 (six) hours as needed. 10/30/22   Adam Phenix, PA-C  amitriptyline (ELAVIL) 150 MG tablet Take 1 tablet (150 mg total) by mouth at bedtime. 02/19/23   Raulkar, Drema Pry, MD  Blood Glucose Monitoring Suppl (ACCU-CHEK GUIDE ME) w/Device KIT See admin instructions. 11/28/22   [provider]  buPROPion (WELLBUTRIN XL) 150 MG 24 hr tablet TAKE 1 TABLET BY MOUTH EVERY DAY IN THE MORNING 11/05/22   Raulkar, Drema Pry, MD  busPIRone (BUSPAR) 5 MG tablet Take 1 tablet (5 mg total) by mouth daily as needed. 02/19/23   Raulkar, Drema Pry, MD  Calcium Citrate-Vitamin D (CALCIUM CITRATE + D3 PO) Take 1 tablet by mouth daily.    [provider]  clonazePAM (KLONOPIN) 0.5 MG tablet Take 1 tablet (0.5 mg total) by mouth at bedtime as needed for anxiety. 03/14/23   Raulkar, Drema Pry, MD   diclofenac Sodium (VOLTAREN) 1 % GEL APPLY 2 GRAMS TO AFFECTED AREA 4 TIMES A DAY Patient taking differently: Apply 2 g topically daily as needed (for pain). 06/25/22   Kathryne Hitch, MD  erythromycin ophthalmic ointment Place a 1/2 inch ribbon of ointment into affected eye daily . 01/26/23   Gustavus Bryant, FNP  fluticasone (FLONASE) 50 MCG/ACT nasal spray Place 1 spray into both nostrils daily. 05/07/22   [provider]  hydrOXYzine (ATARAX) 10 MG tablet Take 10 mg by mouth at bedtime.    [provider]  hyoscyamine (ANASPAZ) 0.125 MG TBDP disintergrating tablet Place 0.125 mg under the tongue daily as needed (Choking eposide per patient).    [provider]  ibuprofen (ADVIL) 200 MG tablet Take 200 mg by mouth 3 (three) times daily.    [provider]  meloxicam (MOBIC) 7.5 MG tablet TAKE 1 TABLET BY MOUTH TWICE A DAY AS NEEDED FOR PAIN 02/13/23   Raulkar, Drema Pry, MD  memantine (NAMENDA) 10 MG tablet Take 1 tablet (10 mg total) by mouth 2 (two) times daily. 06/02/15   Rankin, Shuvon B, NP  methocarbamol (ROBAXIN) 500 MG tablet Take 1 tablet (500 mg total) by mouth every 6 (six) hours as needed for muscle spasms. 12/18/22   Raulkar, Drema Pry, MD  Multiple Vitamins-Minerals (MULTIVITAMIN PO) Take 1 tablet by mouth daily. Centrum 50 +    [provider]  nystatin (MYCOSTATIN/NYSTOP) powder Apply 1 Application topically 2 (two) times daily as needed (for dry skin). 12/06/21   [provider]  Omega-3 Fatty Acids (OMEGA-3 FISH OIL PO) Take 1,000 mg by mouth daily. MIni    [provider]  omeprazole (PRILOSEC) 40 MG capsule Take 40 mg by mouth daily.  09/06/16   [provider]  ondansetron (ZOFRAN ODT) 4 MG disintegrating tablet Take 1 tablet (4 mg total) by mouth every 8 (eight) hours as needed for nausea or vomiting. 02/24/20   Joy, Shawn C, PA-C  polycarbophil (FIBERCON) 625 MG tablet Take 625 mg by mouth daily. gummy     [provider]  polyethylene glycol (MIRALAX / GLYCOLAX) 17 g packet Take 17 g by mouth every other day.    [provider]  pregabalin (LYRICA) 75 MG capsule Take 1 capsule (75 mg total) by mouth at bedtime. 03/12/23   Horton Chin, MD  Probiotic Product (PROBIOTIC PO) Take 1 tablet by mouth daily. gummy    [provider]  QUEtiapine (SEROQUEL) 200 MG tablet Take 1 tablet (200 mg total) by mouth at bedtime. 12/18/22   Raulkar, Drema Pry, MD  QUEtiapine (SEROQUEL) 50 MG tablet Take 1 tablet (50 mg total) by mouth at bedtime. 12/18/22   Raulkar, Drema Pry, MD  Rimegepant Sulfate (NURTEC) 75 MG TBDP Take 1 tablet (75 mg total) by mouth every other day. Use every other day AS NEEDED for migraine prevention 02/20/23  Horton Chin, MD  rosuvastatin (CRESTOR) 10 MG tablet Take 1 tablet (10 mg total) by mouth daily. 11/02/22   Raulkar, Drema Pry, MD  sucralfate (CARAFATE) 1 GM/10ML suspension Take 10 mLs (1 g total) by mouth 4 (four) times daily -  with meals and at bedtime. Patient taking differently: Take 1 g by mouth daily as needed (stomach). 12/11/18   Black, Lesle Chris, NP  tamsulosin (FLOMAX) 0.4 MG CAPS capsule Take 1 capsule (0.4 mg total) by mouth daily after supper. For frequent urination/urgency 01/16/23   Raulkar, Drema Pry, MD  tiZANidine (ZANAFLEX) 4 MG tablet TAKE 1 TABLET BY MOUTH THREE TIMES A DAY 01/29/23   Raulkar, Drema Pry, MD  tiZANidine (ZANAFLEX) 4 MG tablet Take 4 mg by mouth once.    [provider]  traMADol (ULTRAM) 50 MG tablet Take 50 mg by mouth 4 (four) times daily. 10/19/21   [provider]    Physical Exam: BP 125/73 (BP Location: Left Arm)   Pulse 77   Temp 97.8 F (36.6 C)   Resp 16   Ht 5\' 4"  (1.626 m)   Wt 83.9 kg   SpO2 98%   BMI 31.76 kg/m   General: 72 y.o. year-old female well developed well nourished in no acute distress.  Alert and oriented x3. Cardiovascular: Regular rate and rhythm with no rubs or  gallops.  No thyromegaly or JVD noted.  No lower extremity edema. 2/4 pulses in all 4 extremities. Respiratory: Clear to auscultation with no wheezes or rales. Good inspiratory effort. Abdomen: Soft nontender nondistended with normal bowel sounds x4 quadrants. Muskuloskeletal: No cyanosis, clubbing or edema noted bilaterally Neuro: CN II-XII intact, strength, sensation, reflexes Skin: No ulcerative lesions noted or rashes Psychiatry: Judgement and insight appear normal. Mood is appropriate for condition and setting          Labs on Admission:  Basic Metabolic Panel: Recent Labs  Lab 05/02/23 1535  NA 136  K 4.3  CL 104  CO2 19*  GLUCOSE 154*  BUN 31*  CREATININE 1.37*  CALCIUM 9.3   Liver Function Tests: Recent Labs  Lab 05/02/23 1535  AST 25  ALT 19  ALKPHOS 74  BILITOT 0.5  PROT 7.1  ALBUMIN 4.0   No results for input(s): "LIPASE", "AMYLASE" in the last 168 hours. No results for input(s): "AMMONIA" in the last 168 hours. CBC: Recent Labs  Lab 05/02/23 1535  WBC 5.4  NEUTROABS 3.4  HGB 12.8  HCT 40.7  MCV 99.5  PLT 205   Cardiac Enzymes: No results for input(s): "CKTOTAL", "CKMB", "CKMBINDEX", "TROPONINI" in the last 168 hours.  BNP (last 3 results) No results for input(s): "BNP" in the last 8760 hours.  ProBNP (last 3 results) No results for input(s): "PROBNP" in the last 8760 hours.  CBG: Recent Labs  Lab 05/02/23 2310  GLUCAP 88    Radiological Exams on Admission: CT Cervical Spine Wo Contrast  Result Date: 05/02/2023 CLINICAL DATA:  Fall.  Neck trauma. EXAM: CT CERVICAL SPINE WITHOUT CONTRAST TECHNIQUE: Multidetector CT imaging of the cervical spine was performed without intravenous contrast. Multiplanar CT image reconstructions were also generated. RADIATION DOSE REDUCTION: This exam was performed according to the departmental dose-optimization program which includes automated exposure control, adjustment of the mA and/or kV according to  patient size and/or use of iterative reconstruction technique. COMPARISON:  CT of the cervical spine 10/18/2022 FINDINGS: Alignment: No significant listhesis is present. Straightening of the normal cervical lordosis is present. Skull  base and vertebrae: Craniocervical junction is normal. The vertebral body heights are normal. No acute fractures are present. Soft tissues and spinal canal: No prevertebral fluid or swelling. No visible canal hematoma. Disc levels: Endplate degenerative changes are stable, greatest at C5-6 and C6-7. Asymmetric left foraminal narrowing is present at C6-7. Upper chest: The lung apices are clear. The thoracic inlet is within normal limits. IMPRESSION: 1. No acute fracture or traumatic subluxation. 2. Stable multilevel degenerative changes of the cervical spine. Electronically Signed   By: Marin Roberts M.D.   On: 05/02/2023 17:16   CT Head Wo Contrast  Result Date: 05/02/2023 CLINICAL DATA:  Head trauma.  Fall. EXAM: CT HEAD WITHOUT CONTRAST TECHNIQUE: Contiguous axial images were obtained from the base of the skull through the vertex without intravenous contrast. RADIATION DOSE REDUCTION: This exam was performed according to the departmental dose-optimization program which includes automated exposure control, adjustment of the mA and/or kV according to patient size and/or use of iterative reconstruction technique. COMPARISON:  CT head without contrast 10/18/2022 FINDINGS: Brain: No acute infarct, hemorrhage, or mass lesion is present. No significant white matter lesions are present. The ventricles are of normal size. No significant extraaxial fluid collection is present. The brainstem and cerebellum are within normal limits. Midline structures are within normal limits. Vascular: No hyperdense vessel or unexpected calcification. Skull: Calvarium is intact. No focal lytic or blastic lesions are present. Left supraorbital scalp soft tissue swelling is present. No underlying  fracture is present. Sinuses/Orbits: Right frontal sinus opacification appears chronic. The paranasal sinuses and mastoid air cells are otherwise clear. The globes and orbits are within normal limits. IMPRESSION: 1. Left supraorbital scalp soft tissue swelling without underlying fracture. 2. Normal CT appearance of the brain. Electronically Signed   By: Marin Roberts M.D.   On: 05/02/2023 17:14   DG Chest 2 View  Result Date: 05/02/2023 CLINICAL DATA:  Chest pain. EXAM: CHEST - 2 VIEW COMPARISON:  None Available. FINDINGS: The heart size and mediastinal contours are within normal limits. Both lungs are clear. The visualized skeletal structures are unremarkable. IMPRESSION: No active cardiopulmonary disease. Electronically Signed   By: Larose Hires D.O.   On: 05/02/2023 16:22   DG Hip Unilat With Pelvis 2-3 Views Right  Result Date: 05/02/2023 CLINICAL DATA:  Right hip pain EXAM: DG HIP (WITH OR WITHOUT PELVIS) 2-3V RIGHT COMPARISON:  None Available. FINDINGS: There is no evidence of hip fracture or dislocation. Superolateral hip joint space narrowing with marginal spurring. IMPRESSION: No evidence of fracture or dislocation. Mild right hip osteoarthritis. Electronically Signed   By: Larose Hires D.O.   On: 05/02/2023 16:21    EKG: I independently viewed the EKG done and my findings are as followed: Normal sinus rhythm rate of 99.  Nonspecific ST-T changes.  QTc 438.  Assessment/Plan Present on Admission: **None**  Principal Problem:   Generalized weakness  Generalized weakness suspect from polypharmacy versus others UA positive for pyuria On multiple psychotropic medications Will benefit from geriatric primary care provider outpatient follow-up Obtain orthostatic vital signs PT/OT assessment Fall precautions  Presumptive UTI, POA Follow urine culture for ID and sensitivities Started Rocephin.  CKD 3B Currently at baseline with creatinine 1.3 with GFR 41 Avoid nephrotoxic  agents and hypotension.  Anion gap metabolic acidosis Serum bicarb 19, anion gap of 13 Gentle IV fluid hydration LR at 50 cc/h x 1 day.  Chronic anxiety/depression Resume home regimen  Hyperlipidemia Resume home Crestor  GERD Resume home PPI  DVT prophylaxis: Subcu Lovenox daily  Code Status: Full code  Family Communication: None at bedside  Disposition Plan: Admitted to MedSurg unit  Consults called: None.  Admission status: Inpatient status.   Status is: Inpatient The patient requires at least 2 midnights for further evaluation and treatment of present condition.   Darlin Drop MD [facility] Pager [phone]  If 7PM-7AM, please contact night-coverage [email or web address] Password [facility]  05/02/2023, 11:34 PM

## 2023-05-02 NOTE — ED Triage Notes (Signed)
Pt states that she has had weakness x weeks. Pt states when she goes to stand up, she will fall- syncopal event. Pt states that she has had to call EMS multiple times.   Clonazepam at night, which is new  Pt states that she has almost passed out while at the store.

## 2023-05-02 NOTE — ED Notes (Signed)
ED TO INPATIENT HANDOFF REPORT  ED Nurse Name and Phone #: Jeannett Senior [phone]  S Name/Age/Gender Kendra Simmons 72 y.o. female Room/Bed: 038C/038C  Code Status   Code Status: Full Code  Home/SNF/Other Home Patient oriented to: self, place, time, and situation Is this baseline? Yes   Triage Complete: Triage complete  Chief Complaint Generalized weakness [R53.1]  Triage Note Pt states that she has had weakness x weeks. Pt states when she goes to stand up, she will fall- syncopal event. Pt states that she has had to call EMS multiple times.   Clonazepam at night, which is new  Pt states that she has almost passed out while at the store.     Allergies Allergies  Allergen Reactions   Phentermine Anxiety   Augmentin [Amoxicillin-Pot Clavulanate] Diarrhea    Level of Care/Admitting Diagnosis ED Disposition     ED Disposition  Admit   Condition  --   Comment  Hospital Area: [facility] [[id number]]  Level of Care: Med-Surg [16]  May admit patient to [facility] or [facility] if equivalent level of care is available:: Yes  Covid Evaluation: Asymptomatic - no recent exposure (last 10 days) testing not required  Diagnosis: Generalized weakness [454098]  Admitting Physician: Darlin Drop [[id number]]  Attending Physician: Darlin Drop [[id number]]  Certification:: I certify this patient will need inpatient services for at least 2 midnights  Estimated Length of Stay: 2          B Medical/Surgery History Past Medical History:  Diagnosis Date   Anxiety    Arthritis    Depression    Emotional depression 02/04/2015   Frequent falls    GERD (gastroesophageal reflux disease)    High cholesterol    Insomnia    Memory loss    Transient alteration of awareness    Past Surgical History:  Procedure Laterality Date   BOTOX INJECTION N/A 10/17/2021   Procedure: BOTOX INJECTION;  Surgeon: Vida Rigger, MD;  Location: [facility];  Service:  Endoscopy;  Laterality: N/A;   CESAREAN SECTION     COSMETIC SURGERY     ESOPHAGEAL MANOMETRY N/A 09/26/2016   Procedure: ESOPHAGEAL MANOMETRY (EM);  Surgeon: Vida Rigger, MD;  Location: [facility];  Service: Endoscopy;  Laterality: N/A;   ESOPHAGOGASTRODUODENOSCOPY (EGD) WITH PROPOFOL N/A 10/17/2021   Procedure: ESOPHAGOGASTRODUODENOSCOPY (EGD) WITH PROPOFOL;  Surgeon: Vida Rigger, MD;  Location: [facility];  Service: Endoscopy;  Laterality: N/A;   laproscopy       A IV Location/Drains/Wounds Patient Lines/Drains/Airways Status     Active Line/Drains/Airways     Name Placement date Placement time Site Days   Peripheral IV 05/02/23 20 G 1" Right Forearm 05/02/23  2305  Forearm  less than 1            Intake/Output Last 24 hours No intake or output data in the 24 hours ending 05/02/23 2321  Labs/Imaging Results for orders placed or performed during the hospital encounter of 05/02/23 (from the past 48 hour(s))  Comprehensive metabolic panel     Status: Abnormal   Collection Time: 05/02/23  3:35 PM  Result Value Ref Range   Sodium 136 135 - 145 mmol/L   Potassium 4.3 3.5 - 5.1 mmol/L   Chloride 104 98 - 111 mmol/L   CO2 19 (L) 22 - 32 mmol/L   Glucose, Bld 154 (H) 70 - 99 mg/dL    Comment: Glucose reference range applies only to samples taken after fasting for at  least 8 hours.   BUN 31 (H) 8 - 23 mg/dL   Creatinine, Ser 1.61 (H) 0.44 - 1.00 mg/dL   Calcium 9.3 8.9 - 09.6 mg/dL   Total Protein 7.1 6.5 - 8.1 g/dL   Albumin 4.0 3.5 - 5.0 g/dL   AST 25 15 - 41 U/L   ALT 19 0 - 44 U/L   Alkaline Phosphatase 74 38 - 126 U/L   Total Bilirubin 0.5 0.3 - 1.2 mg/dL   GFR, Estimated 41 (L) >60 mL/min    Comment: (NOTE) Calculated using the CKD-EPI Creatinine Equation (2021)    Anion gap 13 5 - 15    Comment: Performed at [facility], [address], [place], [place] [address]  Troponin I (High Sensitivity)     Status: None   Collection Time: 05/02/23  3:35 PM   Result Value Ref Range   Troponin I (High Sensitivity) 3 <18 ng/L    Comment: (NOTE) Elevated high sensitivity troponin I (hsTnI) values and significant  changes across serial measurements may suggest ACS but many other  chronic and acute conditions are known to elevate hsTnI results.  Refer to the "Links" section for chest pain algorithms and additional  guidance. Performed at [facility], [address] [place], [place] [address]   CBC with Differential     Status: None   Collection Time: 05/02/23  3:35 PM  Result Value Ref Range   WBC 5.4 4.0 - 10.5 K/uL   RBC 4.09 3.87 - 5.11 MIL/uL   Hemoglobin 12.8 12.0 - 15.0 g/dL   HCT 14.7 82.9 - 56.2 %   MCV 99.5 80.0 - 100.0 fL   MCH 31.3 26.0 - 34.0 pg   MCHC 31.4 30.0 - 36.0 g/dL   RDW 13.0 86.5 - 78.4 %   Platelets 205 150 - 400 K/uL   nRBC 0.0 0.0 - 0.2 %   Neutrophils Relative % 62 %   Neutro Abs 3.4 1.7 - 7.7 K/uL   Lymphocytes Relative 27 %   Lymphs Abs 1.5 0.7 - 4.0 K/uL   Monocytes Relative 8 %   Monocytes Absolute 0.4 0.1 - 1.0 K/uL   Eosinophils Relative 1 %   Eosinophils Absolute 0.1 0.0 - 0.5 K/uL   Basophils Relative 1 %   Basophils Absolute 0.0 0.0 - 0.1 K/uL   Immature Granulocytes 1 %   Abs Immature Granulocytes 0.03 0.00 - 0.07 K/uL    Comment: Performed at [facility], [address], [place], [place] [address]  Troponin I (High Sensitivity)     Status: None   Collection Time: 05/02/23  6:42 PM  Result Value Ref Range   Troponin I (High Sensitivity) 3 <18 ng/L    Comment: (NOTE) Elevated high sensitivity troponin I (hsTnI) values and significant  changes across serial measurements may suggest ACS but many other  chronic and acute conditions are known to elevate hsTnI results.  Refer to the "Links" section for chest pain algorithms and additional  guidance. Performed at [facility], [address] [place], [place] [address]   POC CBG, ED     Status: None   Collection Time:  05/02/23 11:10 PM  Result Value Ref Range   Glucose-Capillary 88 70 - 99 mg/dL    Comment: Glucose reference range applies only to samples taken after fasting for at least 8 hours.   CT Cervical Spine Wo Contrast  Result Date: 05/02/2023 CLINICAL DATA:  Fall.  Neck trauma. EXAM: CT CERVICAL SPINE WITHOUT CONTRAST TECHNIQUE: Multidetector CT imaging of the cervical spine was performed without intravenous contrast. Multiplanar CT image reconstructions were also generated. RADIATION DOSE REDUCTION: This exam was performed according to the departmental dose-optimization program which includes automated exposure control, adjustment of the mA and/or kV according to patient size and/or use of iterative reconstruction technique. COMPARISON:  CT of the cervical spine 10/18/2022 FINDINGS: Alignment: No significant listhesis is present. Straightening of the normal cervical lordosis is present. Skull base and vertebrae: Craniocervical junction is normal. The vertebral body heights are normal. No acute fractures are present. Soft tissues and spinal canal: No prevertebral fluid or swelling. No visible canal hematoma. Disc levels: Endplate degenerative changes are stable, greatest at C5-6 and C6-7. Asymmetric left foraminal narrowing is present at C6-7. Upper chest: The lung apices are clear. The thoracic inlet is within normal limits. IMPRESSION: 1. No acute fracture or traumatic subluxation. 2. Stable multilevel degenerative changes of the cervical spine. Electronically Signed   By: Marin Roberts M.D.   On: 05/02/2023 17:16   CT Head Wo Contrast  Result Date: 05/02/2023 CLINICAL DATA:  Head trauma.  Fall. EXAM: CT HEAD WITHOUT CONTRAST TECHNIQUE: Contiguous axial images were obtained from the base of the skull through the vertex without intravenous contrast. RADIATION DOSE REDUCTION: This exam was performed according to the departmental dose-optimization program which includes automated exposure control,  adjustment of the mA and/or kV according to patient size and/or use of iterative reconstruction technique. COMPARISON:  CT head without contrast 10/18/2022 FINDINGS: Brain: No acute infarct, hemorrhage, or mass lesion is present. No significant white matter lesions are present. The ventricles are of normal size. No significant extraaxial fluid collection is present. The brainstem and cerebellum are within normal limits. Midline structures are within normal limits. Vascular: No hyperdense vessel or unexpected calcification. Skull: Calvarium is intact. No focal lytic or blastic lesions are present. Left supraorbital scalp soft tissue swelling is present. No underlying fracture is present. Sinuses/Orbits: Right frontal sinus opacification appears chronic. The paranasal sinuses and mastoid air cells are otherwise clear. The globes and orbits are within normal limits. IMPRESSION: 1. Left supraorbital scalp soft tissue swelling without underlying fracture. 2. Normal CT appearance of the brain. Electronically Signed   By: Marin Roberts M.D.   On: 05/02/2023 17:14   DG Chest 2 View  Result Date: 05/02/2023 CLINICAL DATA:  Chest pain. EXAM: CHEST - 2 VIEW COMPARISON:  None Available. FINDINGS: The heart size and mediastinal contours are within normal limits. Both lungs are clear. The visualized skeletal structures are unremarkable. IMPRESSION: No active cardiopulmonary disease. Electronically Signed   By: Larose Hires D.O.   On: 05/02/2023 16:22   DG Hip Unilat With Pelvis 2-3 Views Right  Result Date: 05/02/2023 CLINICAL DATA:  Right hip pain EXAM: DG HIP (WITH OR WITHOUT PELVIS) 2-3V RIGHT COMPARISON:  None Available. FINDINGS: There is no evidence of hip fracture or dislocation. Superolateral hip joint space narrowing with marginal spurring. IMPRESSION: No evidence of fracture or dislocation. Mild right hip osteoarthritis. Electronically Signed   By: Larose Hires D.O.   On: 05/02/2023 16:21    Pending  Labs Unresulted Labs (From admission, onward)     Start     Ordered   05/09/23 0500  Creatinine, serum  (enoxaparin (LOVENOX)    CrCl >/= 30 ml/min)  Weekly,   R     Comments: while on enoxaparin therapy    05/02/23 2254   05/02/23 2254  CBC  (enoxaparin (LOVENOX)    CrCl >/= 30 ml/min)  Once,   R       Comments: Baseline for enoxaparin therapy IF NOT ALREADY DRAWN.  Notify MD if PLT < 100 K.    05/02/23 2254   05/02/23 2254  Creatinine, serum  (enoxaparin (LOVENOX)    CrCl >/= 30 ml/min)  Once,   R       Comments: Baseline for enoxaparin therapy IF NOT ALREADY DRAWN.    05/02/23 2254   05/02/23 2252  Urinalysis, Routine w reflex microscopic -Urine, Clean Catch  Once,   URGENT       Question:  Specimen Source  Answer:  Urine, Clean Catch   05/02/23 2251            Vitals/Pain Today's Vitals   05/02/23 1529 05/02/23 1530 05/02/23 1843  BP: (!) 104/58  125/73  Pulse: 98  77  Resp: 16  16  Temp: 97.7 F (36.5 C)  97.8 F (36.6 C)  TempSrc: Oral    SpO2: 98%  98%  Weight:  83.9 kg   Height:  5\' 4"  (1.626 m)   PainSc: 5       Isolation Precautions No active isolations  Medications Medications  enoxaparin (LOVENOX) injection 40 mg (has no administration in time range)  lactated ringers bolus 1,000 mL (1,000 mLs Intravenous New Bag/Given 05/02/23 2307)    Mobility walks with device     Focused Assessments Neuro Assessment Handoff:  Swallow screen pass? Yes          Neuro Assessment: Within Defined Limits Neuro Checks:      Has TPA been given? No If patient is a Neuro Trauma and patient is going to OR before floor call report to 4N Charge nurse: [phone] or [phone]   R Recommendations: See Admitting Provider Note  Report given to:   Additional Notes: Pt A&Ox4, uses rollator.

## 2023-05-02 NOTE — ED Provider Notes (Signed)
[place] EMERGENCY DEPARTMENT AT [facility] Provider Note   CSN: [id number] Arrival date & time: 05/02/23  1508     History  Chief Complaint  Patient presents with   Fall   Loss of Consciousness    Kendra Simmons is a 72 y.o. female.  72 year old female history of anxiety, depression, GERD, hyperlipidemia presenting for frequent falls.  Patient is here with her friend who does not live with her.  Patient states over the last week she has had at least 5 falls after which she cannot get out.  Frequently when she stands she gets dizzy and feels like she is going to pass out.  She has not lost consciousness.  When she gets dizzy she gets unsteady and then falls.  She has not had any chest pain, palpitations, shortness of breath.  She recently restarted Klonopin, she does not think this is contributing.  She has no pain currently.  She has had slightly decreased p.o. intake.   Fall  Loss of Consciousness Associated symptoms: dizziness        Home Medications Prior to Admission medications   Medication Sig Start Date End Date Taking? Authorizing Provider  ACCU-CHEK GUIDE test strip  11/28/22   [provider]  Accu-Chek Softclix Lancets lancets as directed for 90 days 11/28/22   [provider]  acetaminophen (TYLENOL) 500 MG tablet Take 2 tablets (1,000 mg total) by mouth every 6 (six) hours as needed. 10/30/22   Adam Phenix, PA-C  amitriptyline (ELAVIL) 150 MG tablet Take 1 tablet (150 mg total) by mouth at bedtime. 02/19/23   Raulkar, Drema Pry, MD  Blood Glucose Monitoring Suppl (ACCU-CHEK GUIDE ME) w/Device KIT See admin instructions. 11/28/22   [provider]  buPROPion (WELLBUTRIN XL) 150 MG 24 hr tablet TAKE 1 TABLET BY MOUTH EVERY DAY IN THE MORNING 11/05/22   Raulkar, Drema Pry, MD  busPIRone (BUSPAR) 5 MG tablet Take 1 tablet (5 mg total) by mouth daily as needed. 02/19/23   Raulkar, Drema Pry, MD  Calcium  Citrate-Vitamin D (CALCIUM CITRATE + D3 PO) Take 1 tablet by mouth daily.    [provider]  clonazePAM (KLONOPIN) 0.5 MG tablet Take 1 tablet (0.5 mg total) by mouth at bedtime as needed for anxiety. 03/14/23   Raulkar, Drema Pry, MD  diclofenac Sodium (VOLTAREN) 1 % GEL APPLY 2 GRAMS TO AFFECTED AREA 4 TIMES A DAY Patient taking differently: Apply 2 g topically daily as needed (for pain). 06/25/22   Kathryne Hitch, MD  erythromycin ophthalmic ointment Place a 1/2 inch ribbon of ointment into affected eye daily . 01/26/23   Gustavus Bryant, FNP  fluticasone (FLONASE) 50 MCG/ACT nasal spray Place 1 spray into both nostrils daily. 05/07/22   [provider]  hydrOXYzine (ATARAX) 10 MG tablet Take 10 mg by mouth at bedtime.    [provider]  hyoscyamine (ANASPAZ) 0.125 MG TBDP disintergrating tablet Place 0.125 mg under the tongue daily as needed (Choking eposide per patient).    [provider]  ibuprofen (ADVIL) 200 MG tablet Take 200 mg by mouth 3 (three) times daily.    [provider]  meloxicam (MOBIC) 7.5 MG tablet TAKE 1 TABLET BY MOUTH TWICE A DAY AS NEEDED FOR PAIN 02/13/23   Raulkar, Drema Pry, MD  memantine (NAMENDA) 10 MG tablet Take 1 tablet (10 mg total) by mouth 2 (two) times daily. 06/02/15   Rankin, Shuvon B, NP  methocarbamol (ROBAXIN) 500  MG tablet Take 1 tablet (500 mg total) by mouth every 6 (six) hours as needed for muscle spasms. 12/18/22   Raulkar, Drema Pry, MD  Multiple Vitamins-Minerals (MULTIVITAMIN PO) Take 1 tablet by mouth daily. Centrum 50 +    [provider]  nystatin (MYCOSTATIN/NYSTOP) powder Apply 1 Application topically 2 (two) times daily as needed (for dry skin). 12/06/21   [provider]  Omega-3 Fatty Acids (OMEGA-3 FISH OIL PO) Take 1,000 mg by mouth daily. MIni    [provider]  omeprazole (PRILOSEC) 40 MG capsule Take 40 mg by mouth daily.  09/06/16   [provider]   ondansetron (ZOFRAN ODT) 4 MG disintegrating tablet Take 1 tablet (4 mg total) by mouth every 8 (eight) hours as needed for nausea or vomiting. 02/24/20   Joy, Shawn C, PA-C  polycarbophil (FIBERCON) 625 MG tablet Take 625 mg by mouth daily. gummy    [provider]  polyethylene glycol (MIRALAX / GLYCOLAX) 17 g packet Take 17 g by mouth every other day.    [provider]  pregabalin (LYRICA) 75 MG capsule Take 1 capsule (75 mg total) by mouth at bedtime. 03/12/23   Horton Chin, MD  Probiotic Product (PROBIOTIC PO) Take 1 tablet by mouth daily. gummy    [provider]  QUEtiapine (SEROQUEL) 200 MG tablet Take 1 tablet (200 mg total) by mouth at bedtime. 12/18/22   Raulkar, Drema Pry, MD  QUEtiapine (SEROQUEL) 50 MG tablet Take 1 tablet (50 mg total) by mouth at bedtime. 12/18/22   Raulkar, Drema Pry, MD  Rimegepant Sulfate (NURTEC) 75 MG TBDP Take 1 tablet (75 mg total) by mouth every other day. Use every other day AS NEEDED for migraine prevention 02/20/23   Raulkar, Drema Pry, MD  rosuvastatin (CRESTOR) 10 MG tablet Take 1 tablet (10 mg total) by mouth daily. 11/02/22   Raulkar, Drema Pry, MD  sucralfate (CARAFATE) 1 GM/10ML suspension Take 10 mLs (1 g total) by mouth 4 (four) times daily -  with meals and at bedtime. Patient taking differently: Take 1 g by mouth daily as needed (stomach). 12/11/18   Black, Lesle Chris, NP  tamsulosin (FLOMAX) 0.4 MG CAPS capsule Take 1 capsule (0.4 mg total) by mouth daily after supper. For frequent urination/urgency 01/16/23   Raulkar, Drema Pry, MD  tiZANidine (ZANAFLEX) 4 MG tablet TAKE 1 TABLET BY MOUTH THREE TIMES A DAY 01/29/23   Raulkar, Drema Pry, MD  tiZANidine (ZANAFLEX) 4 MG tablet Take 4 mg by mouth once.    [provider]  traMADol (ULTRAM) 50 MG tablet Take 50 mg by mouth 4 (four) times daily. 10/19/21   [provider]      Allergies    Phentermine and Augmentin [amoxicillin-pot clavulanate]    Review of  Systems   Review of Systems  Cardiovascular:  Positive for syncope.  Neurological:  Positive for dizziness and light-headedness.  All other systems reviewed and are negative.   Physical Exam Updated Vital Signs BP 125/73 (BP Location: Left Arm)   Pulse 77   Temp 97.8 F (36.6 C)   Resp 16   Ht 5\' 4"  (1.626 m)   Wt 83.9 kg   SpO2 98%   BMI 31.76 kg/m  Physical Exam Vitals and nursing note reviewed.  Constitutional:      General: She is not in acute distress.    Appearance: She is well-developed.  HENT:     Head: Normocephalic and atraumatic.  Nose: Nose normal.     Mouth/Throat:     Mouth: Mucous membranes are dry.     Pharynx: Oropharynx is clear. No oropharyngeal exudate or posterior oropharyngeal erythema.  Eyes:     Extraocular Movements: Extraocular movements intact.     Conjunctiva/sclera: Conjunctivae normal.     Pupils: Pupils are equal, round, and reactive to light.  Cardiovascular:     Rate and Rhythm: Normal rate and regular rhythm.     Heart sounds: No murmur heard. Pulmonary:     Effort: Pulmonary effort is normal. No respiratory distress.     Breath sounds: Normal breath sounds.  Abdominal:     Palpations: Abdomen is soft.     Tenderness: There is no abdominal tenderness. There is no guarding or rebound.  Musculoskeletal:        General: No swelling.     Cervical back: Normal range of motion and neck supple. No rigidity or tenderness.     Right lower leg: No edema.     Left lower leg: No edema.  Skin:    General: Skin is warm and dry.     Capillary Refill: Capillary refill takes less than 2 seconds.  Neurological:     General: No focal deficit present.     Mental Status: She is alert and oriented to person, place, and time. Mental status is at baseline.     Cranial Nerves: No cranial nerve deficit.     Sensory: No sensory deficit.     Motor: No weakness.     Coordination: Coordination normal.     Deep Tendon Reflexes: Reflexes normal.      Comments: Slightly unsteady on her feet.  Psychiatric:        Mood and Affect: Mood normal.     ED Results / Procedures / Treatments   Labs (all labs ordered are listed, but only abnormal results are displayed) Labs Reviewed  COMPREHENSIVE METABOLIC PANEL - Abnormal; Notable for the following components:      Result Value   CO2 19 (*)    Glucose, Bld 154 (*)    BUN 31 (*)    Creatinine, Ser 1.37 (*)    GFR, Estimated 41 (*)    All other components within normal limits  CBC WITH DIFFERENTIAL/PLATELET  URINALYSIS, ROUTINE W REFLEX MICROSCOPIC  CBC  CREATININE, SERUM  CBG MONITORING, ED  TROPONIN I (HIGH SENSITIVITY)  TROPONIN I (HIGH SENSITIVITY)    EKG None  Radiology CT Cervical Spine Wo Contrast  Result Date: 05/02/2023 CLINICAL DATA:  Fall.  Neck trauma. EXAM: CT CERVICAL SPINE WITHOUT CONTRAST TECHNIQUE: Multidetector CT imaging of the cervical spine was performed without intravenous contrast. Multiplanar CT image reconstructions were also generated. RADIATION DOSE REDUCTION: This exam was performed according to the departmental dose-optimization program which includes automated exposure control, adjustment of the mA and/or kV according to patient size and/or use of iterative reconstruction technique. COMPARISON:  CT of the cervical spine 10/18/2022 FINDINGS: Alignment: No significant listhesis is present. Straightening of the normal cervical lordosis is present. Skull base and vertebrae: Craniocervical junction is normal. The vertebral body heights are normal. No acute fractures are present. Soft tissues and spinal canal: No prevertebral fluid or swelling. No visible canal hematoma. Disc levels: Endplate degenerative changes are stable, greatest at C5-6 and C6-7. Asymmetric left foraminal narrowing is present at C6-7. Upper chest: The lung apices are clear. The thoracic inlet is within normal limits. IMPRESSION: 1. No acute fracture or traumatic subluxation. 2. Stable  multilevel  degenerative changes of the cervical spine. Electronically Signed   By: Marin Roberts M.D.   On: 05/02/2023 17:16   CT Head Wo Contrast  Result Date: 05/02/2023 CLINICAL DATA:  Head trauma.  Fall. EXAM: CT HEAD WITHOUT CONTRAST TECHNIQUE: Contiguous axial images were obtained from the base of the skull through the vertex without intravenous contrast. RADIATION DOSE REDUCTION: This exam was performed according to the departmental dose-optimization program which includes automated exposure control, adjustment of the mA and/or kV according to patient size and/or use of iterative reconstruction technique. COMPARISON:  CT head without contrast 10/18/2022 FINDINGS: Brain: No acute infarct, hemorrhage, or mass lesion is present. No significant white matter lesions are present. The ventricles are of normal size. No significant extraaxial fluid collection is present. The brainstem and cerebellum are within normal limits. Midline structures are within normal limits. Vascular: No hyperdense vessel or unexpected calcification. Skull: Calvarium is intact. No focal lytic or blastic lesions are present. Left supraorbital scalp soft tissue swelling is present. No underlying fracture is present. Sinuses/Orbits: Right frontal sinus opacification appears chronic. The paranasal sinuses and mastoid air cells are otherwise clear. The globes and orbits are within normal limits. IMPRESSION: 1. Left supraorbital scalp soft tissue swelling without underlying fracture. 2. Normal CT appearance of the brain. Electronically Signed   By: Marin Roberts M.D.   On: 05/02/2023 17:14   DG Chest 2 View  Result Date: 05/02/2023 CLINICAL DATA:  Chest pain. EXAM: CHEST - 2 VIEW COMPARISON:  None Available. FINDINGS: The heart size and mediastinal contours are within normal limits. Both lungs are clear. The visualized skeletal structures are unremarkable. IMPRESSION: No active cardiopulmonary disease. Electronically Signed   By: Larose Hires D.O.   On: 05/02/2023 16:22   DG Hip Unilat With Pelvis 2-3 Views Right  Result Date: 05/02/2023 CLINICAL DATA:  Right hip pain EXAM: DG HIP (WITH OR WITHOUT PELVIS) 2-3V RIGHT COMPARISON:  None Available. FINDINGS: There is no evidence of hip fracture or dislocation. Superolateral hip joint space narrowing with marginal spurring. IMPRESSION: No evidence of fracture or dislocation. Mild right hip osteoarthritis. Electronically Signed   By: Larose Hires D.O.   On: 05/02/2023 16:21    Procedures Procedures    Medications Ordered in ED Medications  lactated ringers bolus 1,000 mL (has no administration in time range)  enoxaparin (LOVENOX) injection 40 mg (has no administration in time range)    ED Course/ Medical Decision Making/ A&P Clinical Course as of 05/02/23 2302  Thu May 02, 2023  2236 EKG normal sinus rhythm, borderline LVH, inferior Q waves, no acute ST or T wave abnormalities.  EKG grossly unchanged from prior September 11, 2022. [JD]    Clinical Course User Index [JD] Fulton Reek, MD                             Medical Decision Making Amount and/or Complexity of Data Reviewed Labs: ordered.  Risk Decision regarding hospitalization.   73 year old female presenting for frequent falls, orthostasis.  Vital signs reviewed.  On exam she appears dry, and history is concerning for orthostatic hypotension likely leading to her falls.  Her neurologic exam is unremarkable, low concern for central vertigo.  CT head and cervical spine reviewed, no signs of acute traumatic injury or CVA.  No other signs of trauma on exam.  Labwork reviewed, CBC is unremarkable.  CMP notable for mild AKI consistent with dehydration.  She  is able to ambulate, however she is on multiple falls at home and lives alone with little social support.  I think she is high risk for additional falls at home and syncope.  Given this, I think she would benefit from admission for IV fluids, repeat labs,  physical therapy evaluation.  She is agreeable to this.  I discussed the patient with the hospitalist service, she was admitted for further management.        Final Clinical Impression(s) / ED Diagnoses Final diagnoses:  AKI (acute kidney injury) [facility])    Rx / DC Orders ED Discharge Orders     None         Fulton Reek, MD 05/02/23 1610    Blane Ohara, MD 05/02/23 [phone]

## 2023-05-02 NOTE — ED Notes (Signed)
Placed patient in ER 21, patient admitted into monitor & placed on cardiac monitor, vital signs taken & RN notified of patient in room.

## 2023-05-02 NOTE — ED Provider Triage Note (Signed)
Emergency Medicine Provider Triage Evaluation Note  Kendra Simmons , a 72 y.o. female  was evaluated in triage.  Pt complains of syncopal episode.  Patient states that she has been having intermittent syncopal episodes for the past month.  Reports some episodes associated with positional changes such as abruptly standing up from a seated/laying down position.  At other times, she has noted near syncopal episodes with exertion where she begins to feel short of breath and subsequently feeling as if she is going to pass out.  Patient states that she has had syncopal episodes with exertion.  Currently complaining of mild posterior headache, right hip pain from traumatic perspective from most recent fall.  Denies visual disturbance, gait abnormality from baseline, weakness or sensory deficits in upper extremities, slurred speech, facial droop.  She does report most recently being put on Klonopin approximately 1 month ago prior to symptoms beginning.  Review of Systems  Positive: See above Negative:   Physical Exam  BP (!) 104/58 (BP Location: Right Arm)   Pulse 98   Temp 97.7 F (36.5 C) (Oral)   Resp 16   Ht 5\' 4"  (1.626 m)   Wt 83.9 kg   SpO2 98%   BMI 31.76 kg/m  Gen:   Awake, no distress   Resp:  Normal effort  MSK:   Moves extremities without difficulty  Other:  Cranial nerves III through XII grossly intact.  Medical Decision Making  Medically screening exam initiated at 3:44 PM.  Appropriate orders placed.  Kendra Simmons was informed that the remainder of the evaluation will be completed by another provider, this initial triage assessment does not replace that evaluation, and the importance of remaining in the ED until their evaluation is complete.     Peter Garter, [place] 05/02/23 [phone]

## 2023-05-03 ENCOUNTER — Ambulatory Visit: Payer: 59 | Admitting: Podiatry

## 2023-05-03 ENCOUNTER — Encounter (HOSPITAL_COMMUNITY): Payer: Self-pay | Admitting: Internal Medicine

## 2023-05-03 ENCOUNTER — Other Ambulatory Visit: Payer: Self-pay | Admitting: Physical Medicine and Rehabilitation

## 2023-05-03 ENCOUNTER — Other Ambulatory Visit: Payer: Self-pay

## 2023-05-03 DIAGNOSIS — R531 Weakness: Secondary | ICD-10-CM | POA: Diagnosis not present

## 2023-05-03 LAB — CBC
HCT: 33.7 % — ABNORMAL LOW (ref 36.0–46.0)
Hemoglobin: 11.1 g/dL — ABNORMAL LOW (ref 12.0–15.0)
MCH: 31.6 pg (ref 26.0–34.0)
MCHC: 32.9 g/dL (ref 30.0–36.0)
MCV: 96 fL (ref 80.0–100.0)
Platelets: 124 10*3/uL — ABNORMAL LOW (ref 150–400)
RBC: 3.51 MIL/uL — ABNORMAL LOW (ref 3.87–5.11)
RDW: 13.3 % (ref 11.5–15.5)
WBC: 3.8 10*3/uL — ABNORMAL LOW (ref 4.0–10.5)
nRBC: 0 % (ref 0.0–0.2)

## 2023-05-03 LAB — BASIC METABOLIC PANEL
Anion gap: 10 (ref 5–15)
BUN: 25 mg/dL — ABNORMAL HIGH (ref 8–23)
CO2: 25 mmol/L (ref 22–32)
Calcium: 8.8 mg/dL — ABNORMAL LOW (ref 8.9–10.3)
Chloride: 106 mmol/L (ref 98–111)
Creatinine, Ser: 1.12 mg/dL — ABNORMAL HIGH (ref 0.44–1.00)
GFR, Estimated: 52 mL/min — ABNORMAL LOW (ref >60)
Glucose, Bld: 121 mg/dL — ABNORMAL HIGH (ref 70–99)
Potassium: 3.9 mmol/L (ref 3.5–5.1)
Sodium: 141 mmol/L (ref 135–145)

## 2023-05-03 LAB — URINALYSIS, ROUTINE W REFLEX MICROSCOPIC
Bacteria, UA: NONE SEEN
Bilirubin Urine: NEGATIVE
Glucose, UA: NEGATIVE mg/dL
Hgb urine dipstick: NEGATIVE
Ketones, ur: NEGATIVE mg/dL
Nitrite: NEGATIVE
Protein, ur: NEGATIVE mg/dL
Specific Gravity, Urine: 1.027 (ref 1.005–1.030)
pH: 5 (ref 5.0–8.0)

## 2023-05-03 LAB — PHOSPHORUS: Phosphorus: 3.7 mg/dL (ref 2.5–4.6)

## 2023-05-03 LAB — CREATININE, SERUM
Creatinine, Ser: 1.18 mg/dL — ABNORMAL HIGH (ref 0.44–1.00)
GFR, Estimated: 49 mL/min — ABNORMAL LOW (ref >60)

## 2023-05-03 LAB — MAGNESIUM: Magnesium: 1.9 mg/dL (ref 1.7–2.4)

## 2023-05-03 MED ORDER — SODIUM CHLORIDE 0.9 % IV SOLN
2.0000 g | INTRAVENOUS | Status: DC
  Administered 2023-05-03 – 2023-05-05 (×3): 2 g via INTRAVENOUS
  Filled 2023-05-03 (×3): qty 20

## 2023-05-03 MED ORDER — METHOCARBAMOL 500 MG PO TABS
500.0000 mg | ORAL_TABLET | Freq: Four times a day (QID) | ORAL | Status: DC | PRN
  Administered 2023-05-03 – 2023-05-04 (×4): 500 mg via ORAL
  Filled 2023-05-03 (×4): qty 1

## 2023-05-03 MED ORDER — POLYETHYLENE GLYCOL 3350 17 G PO PACK
17.0000 g | PACK | ORAL | Status: DC
  Administered 2023-05-03 – 2023-05-05 (×3): 17 g via ORAL
  Filled 2023-05-03 (×3): qty 1

## 2023-05-03 MED ORDER — CLONAZEPAM 0.5 MG PO TABS
0.5000 mg | ORAL_TABLET | Freq: Every evening | ORAL | Status: DC | PRN
  Administered 2023-05-03: 0.5 mg via ORAL
  Filled 2023-05-03: qty 1

## 2023-05-03 MED ORDER — ORAL CARE MOUTH RINSE: 15.0000 mL | OROMUCOSAL | Status: DC | PRN

## 2023-05-03 NOTE — TOC Initial Note (Addendum)
Transition of Care [facility]) - Initial/Assessment Note    Patient Details  Name: Kendra Simmons MRN: [id number] Date of Birth: August 21, 1951  Transition of Care [facility]) CM/SW Contact:    Tom-Johnson, Hershal Coria, RN Phone Number: 05/03/2023, 12:54 PM  Clinical Narrative:                  CM spoke with patient at bedside about needs for post hospital transition. Admitted for General weakness/Falls. Patient started on IV fluids.  Patient is from home alone, has three children. One of her daughters is Autistic and resides at a Group Home. Patient is a retired Engineer, civil (consulting).  Independent with care and drive self prior to admission.  Has a rollator which is at bedside, a shower seat at home and planning on fixing bathroom rails at discharge. PCP is Aliene Beams, MD [facility] [address]  Awaiting PT/OT eval for disposition. CM will continue to follow as patient progresses with care towards discharge.            Expected Discharge Plan: Home w Home Health Services Barriers to Discharge: Continued Medical Work up   Patient Goals and CMS Choice Patient states their goals for this hospitalization and ongoing recovery are:: To return home CMS Medicare.gov Compare Post Acute Care list provided to:: Patient Choice offered to / list presented to : Patient      Expected Discharge Plan and Services   Discharge Planning Services: CM Consult Post Acute Care Choice: Home Health Living arrangements for the past 2 months: Single Family Home                 DME Arranged: N/A DME Agency: NA       HH Arranged: PT, OT, Nurse's Aide   Date HH Agency Contacted: 05/03/23 Time HH Agency Contacted: [phone]    Prior Living Arrangements/Services Living arrangements for the past 2 months: Single Family Home Lives with:: Self Patient language and need for interpreter reviewed:: Yes Do you feel safe going back to the place where you live?: Yes      Need for Family  Participation in Patient Care: Yes (Comment) Care giver support system in place?: Yes (comment) Current home services: DME (Rollator, shower seat, rails) Criminal Activity/Legal Involvement Pertinent to Current Situation/Hospitalization: No - Comment as needed  Activities of Daily Living Home Assistive Devices/Equipment: Other (Comment) (Rolator) ADL Screening (condition at time of admission) Patient's cognitive ability adequate to safely complete daily activities?: Yes Is the patient deaf or have difficulty hearing?: No Does the patient have difficulty seeing, even when wearing glasses/contacts?: No Does the patient have difficulty concentrating, remembering, or making decisions?: No Patient able to express need for assistance with ADLs?: Yes Does the patient have difficulty dressing or bathing?: No Independently performs ADLs?: No Communication: Independent Dressing (OT): Independent Grooming: Independent Feeding: Independent Bathing: Independent Toileting: Independent In/Out Bed: Independent Walks in Home: Needs assistance Is this a change from baseline?: Pre-admission baseline Does the patient have difficulty walking or climbing stairs?: Yes Weakness of Legs: Both Weakness of Arms/Hands: None  Permission Sought/Granted Permission sought to share information with : Case Manager, Magazine features editor, Other (comment) Permission granted to share information with : Yes, Verbal Permission Granted              Emotional Assessment Appearance:: Appears stated age Attitude/Demeanor/Rapport: Engaged, Gracious Affect (typically observed): Accepting, Appropriate, Calm, Hopeful, Pleasant Orientation: : Oriented to Self, Oriented to Place, Oriented to Situation Alcohol / Substance Use:  Not Applicable Psych Involvement: No (comment)  Admission diagnosis:  Generalized weakness [R53.1] AKI (acute kidney injury) (HCC) [N17.9] Patient Active Problem List   Diagnosis Date  Noted   Generalized weakness 05/02/2023   Acute posttraumatic stress disorder 10/24/2022   Abdominal contusion 10/19/2022   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   Sleep paralysis, 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   Sepsis without acute organ dysfunction (HCC)    Regurgitation of stomach contents    Esophageal dysphagia    Community acquired pneumonia of right upper lobe of lung 12/08/2018   Chronic bilateral low back pain 07/01/2017   Dehydration 07/03/2015   UTI (lower urinary tract infection) 07/03/2015   Hypokalemia 07/03/2015   AKI (acute kidney injury) (HCC) 07/03/2015   Tachycardia 05/27/2015   MDD (major depressive disorder), recurrent, severe, 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 [place]  [address] Phone: [phone] Fax: [phone]  [facility] - [place], [place] - [address] [place] [address] Phone: [phone] Fax: [phone]  [facility] [address] [place] [address] Phone: [phone] Fax: [phone]     Social Determinants of Health (SDOH) Social History: SDOH Screenings   Food Insecurity: No Food Insecurity (05/03/2023)  Housing: Low Risk  (05/03/2023)  Transportation Needs: No Transportation Needs (05/03/2023)  Utilities: Not At Risk (05/03/2023)  Depression (PHQ2-9): Low Risk  (12/18/2022)  Tobacco Use: Low Risk  (05/03/2023)   SDOH  Interventions: Transportation Interventions: Intervention Not Indicated, Inpatient TOC, Patient Resources (Friends/Family)   Readmission Risk Interventions    05/03/2023   12:51 PM 10/30/2022   11:39 AM  Readmission Risk Prevention Plan  Transportation Screening Complete Complete  PCP or Specialist Appt within 5-7 Days  Complete  PCP or Specialist Appt within 3-5 Days Complete   Home Care Screening  Complete  Medication Review (RN CM)  Complete  HRI or Home Care Consult Complete   Social Work Consult for Recovery Care Planning/Counseling Complete   Palliative Care Screening Not Applicable   Medication Review Oceanographer) Referral to Pharmacy

## 2023-05-03 NOTE — Progress Notes (Signed)
PROGRESS NOTE  Cassander Siv Colantuono  DOB: 30-Mar-1951  PCP: Aliene Beams, MD ZOX:[id number]  DOA: 05/02/2023  LOS: 1 day  Hospital Day: 2  Brief narrative: Kendra Simmons is a 72 y.o. female with PMH significant for obesity, HLD, mild dementia, anxiety/depression, arthritis, GERD 5/16, patient presented to the ED with complaint of progressively worsening weakness for several weeks.  She mentions that she has had multiple episodes of dizziness leading to fall while trying to stand up and had to call EMS multiple times. On the day of presentation, she fell twice, reportedly failed more than 5 times in the past week.  In the ED, vital signs are stable. Neurological exam was unremarkable CT head and CT cervical neck were unremarkable for acute findings Labs with unremarkable CBC, mildly elevated creatinine Urinalysis showed clear yellow urine, negative ketones, no bacteria but moderate leukocytes She was started on IV fluid and admitted to [facility]  Subjective: Patient was seen and examined this morning.  Pleasant elderly Caucasian female.  Lying on bed.  Not in distress. Prior to my evaluation, she was seen by physical therapy. Noted that she had significant orthostatic drop in blood pressure from 112/68 on supine to 57/49 when standing at 3 minutes.  Patient feels weak and wobbly on walking. Patient is discharged that she has family members provide but she is not in touch with them. Chart reviewed.  Remains hemodynamically stable  Assessment and plan: Dizziness on standing up Frequent falls Generalized weakness Suspect orthostatic hypotension.  Obtain orthostatic vital signs positive as above. Continue IV hydration.  I discussed with patient about several of her medicines..  Patient seems to be using Flomax almost daily for urinary urgency.  I recommend to stop it because of his tendency to increase risk of orthostatic hypotension. Also suspect polypharmacy as patient is on  more than 5 different mood altering medications as below.  Recommend to cut back on medicines. PT eval obtained.  Home with PT recommended.   Dementia  anxiety/depression PTA on Namenda 10 mg twice daily, amitriptyline 150 mg nightly, Wellbutrin 150 mg daily, Lyrica 75 mg nightly, cervical 250 mg nightly, Klonopin 0.5 mg nightly, Robaxin 500 mg every 6 hours as needed I recommended to minimize use of mood altering medications.  Patient seems reluctant to cut back on her psychiatric medicines.  She needs to follow-up with psychiatry as an outpatient.  UTI Reports urinary frequency worsening last few days than before. Urinalysis showed pyuria. Currently on IV Rocephin  AKI on CKD 3B Creatinine elevated 1.37 on admission.  Gradually improved with IV fluid.  Continue to monitor Recent Labs    08/02/22 1921 09/11/22 1433 10/18/22 2020 10/19/22 0127 10/21/22 1146 10/26/22 0921 05/02/23 1535 05/02/23 2306 05/03/23 1016  BUN 37* 17 16  --  13  --  31*  --  25*  CREATININE 1.57* 0.93 1.01* 0.90 0.95 0.94 1.37* 1.18* 1.12*   Hyperlipidemia Resume home Crestor   GERD Resume home PPI  Urinary urgency Was taking Flomax as needed.  I would recommend stop it because of risk of orthostatic hypotension  Goals of care   Code Status: Full Code     DVT prophylaxis:  enoxaparin (LOVENOX) injection 40 mg Start: 05/03/23 1000   Antimicrobials: IV Rocephin Fluid: LR at 50 mill per hour Consultants: None Family Communication: None at bedside  Status: Inpatient Level of care:  Med-Surg   Patient from: Home Anticipated d/c to: Home with PT in 1 to 2 days Needs to  continue in-hospital care:  Remains orthostatic.  Needs IV fluid      Diet:  Diet Order             Diet regular Fluid consistency: Thin  Diet effective now                   Scheduled Meds:  enoxaparin (LOVENOX) injection  40 mg Subcutaneous Q24H   pantoprazole  40 mg Oral Daily   polyethylene glycol  17  g Oral QODAY   rosuvastatin  10 mg Oral Daily    PRN meds: acetaminophen, clonazePAM, methocarbamol, mouth rinse, polyethylene glycol, prochlorperazine   Infusions:   cefTRIAXone (ROCEPHIN)  IV 2 g (05/03/23 0501)   lactated ringers 50 mL/hr at 05/03/23 0857    Antimicrobials: Anti-infectives (From admission, onward)    Start     Dose/Rate Route Frequency Ordered Stop   05/03/23 0530  cefTRIAXone (ROCEPHIN) 2 g in sodium chloride 0.9 % 100 mL IVPB        2 g 200 mL/hr over 30 Minutes Intravenous Every 24 hours 05/03/23 0441         Nutritional status:  Body mass index is 31.37 kg/m.          Objective: Vitals:   05/03/23 0521 05/03/23 0848  BP: 134/60 120/60  Pulse: 83 89  Resp:  16  Temp: 97.7 F (36.5 C) 97.7 F (36.5 C)  SpO2: 98% 98%    Intake/Output Summary (Last 24 hours) at 05/03/2023 1349 Last data filed at 05/03/2023 0900 Gross per 24 hour  Intake 1714.82 ml  Output 0 ml  Net 1714.82 ml   Filed Weights   05/02/23 1530 05/03/23 0000  Weight: 83.9 kg 82.9 kg   Weight change:  Body mass index is 31.37 kg/m.   Physical Exam: General exam: Pleasant, elderly Caucasian female.  Not in pain Skin: No rashes, lesions or ulcers. HEENT: Atraumatic, normocephalic, no obvious bleeding Lungs: Clear to auscultation bilaterally CVS: Regular rate and rhythm, no murmur GI/Abd soft, nontender, nondistended, bowels are present CNS: Alert, awake, oriented x 3 Psychiatry: Sad affect Extremities: No pedal edema, no calf tenderness  Data Review: I have personally reviewed the laboratory data and studies available.  F/u labs ordered Unresulted Labs (From admission, onward)     Start     Ordered   05/09/23 0500  Creatinine, serum  (enoxaparin (LOVENOX)    CrCl >/= 30 ml/min)  Weekly,   R     Comments: while on enoxaparin therapy    05/02/23 2254   05/03/23 0442  Urinalysis, w/ Reflex to Culture (Infection Suspected) -Urine, Clean Catch  (Urine Labs)  Once,    R       Question:  Specimen Source  Answer:  Urine, Clean Catch   05/03/23 0441            Total time spent in review of labs and imaging, patient evaluation, formulation of plan, documentation and communication with family: 55 minutes  Signed, Lorin Glass, MD [facility] 05/03/2023

## 2023-05-03 NOTE — Plan of Care (Signed)
  Problem: Education: Goal: Knowledge of General Education information will improve Description Including pain rating scale, medication(s)/side effects and non-pharmacologic comfort measures Outcome: Progressing   

## 2023-05-03 NOTE — Evaluation (Signed)
Physical Therapy Evaluation Patient Details Name: Kendra Simmons MRN: [id number] DOB: 12-Nov-1951 Today's Date: 05/03/2023  History of Present Illness  Pt is a 72 y/o F admitted on 05/02/23 after presenting with c/o falls. Pt notes falling 2 times that day & more than 5 times in the past week. UA was positive for pyuria. Pt is being treated for generalized weakness suspect from polypharmacy versus others, presumptive UTI. PMH: generalized anxiety/depression, obesity, GERD, HLD, insomnia, memory loss  Clinical Impression  Pt seen for PT evaluation with pt agreeable to tx. Pt reports she lives alone, has a friend that assists, uses a rollator in her home, but endorses more falls with 6 over the past week. Pt does not recall cause of the falls. PT inquired about ted hose following last admission (noted pt used them in past PT notes) with pt unable to recall how long she used them & does not seem like she used them at home. On this date, pt is able to complete bed mobility with mod I & transfer STS at EOB without AD. Pt tolerates standing ~4-5 minutes with supervision without LOB. Pt with c/o feeling wobbly but only with mild c/o dizziness & requests to sit at end. Pt noted to have significant orthostatic hypotension & team made aware. Anticipate once pt's BP is more stable pt will mobilize well. Will continue to follow acutely to address balance, endurance, & strength to reduce fall risk & increase independence with mobility.  BP checked in LUE: Supine: 112/68 mmHg (MAP 83), HR 87 bpm Sitting: 116/67 mmHg (MAP 81), HR 91 bpm Standing at 0: 84/54 mmHg (MAP 62), HR 97 bpm Standing at 3: 57/49 mmHg (MAP 53), HR 105 bpm  In bed at end of session: 144/76 mmHg (MAP 93), HR 93 bpm      Recommendations for follow up therapy are one component of a multi-disciplinary discharge planning process, led by the attending physician.  Recommendations may be updated based on patient status, additional functional  criteria and insurance authorization.  Follow Up Recommendations       Assistance Recommended at Discharge PRN  Patient can return home with the following  Assistance with cooking/housework;Help with stairs or ramp for entrance;Assist for transportation    Equipment Recommendations None recommended by PT  Recommendations for Other Services       Functional Status Assessment Patient has had a recent decline in their functional status and demonstrates the ability to make significant improvements in function in a reasonable and predictable amount of time.     Precautions / Restrictions Precautions Precautions: Fall Restrictions Weight Bearing Restrictions: No      Mobility  Bed Mobility Overal bed mobility: Modified Independent             General bed mobility comments: supine<>sit with HOB elevated    Transfers Overall transfer level: Modified independent Equipment used: None Transfers: Sit to/from Stand Sit to Stand: Modified independent (Device/Increase time)           General transfer comment: STS from EOB    Ambulation/Gait                  Stairs            Wheelchair Mobility    Modified Rankin (Stroke Patients Only)       Balance Overall balance assessment: History of Falls, Needs assistance   Sitting balance-Leahy Scale: Good     Standing balance support: No upper extremity supported Standing balance-Leahy Scale: Fair  Standing balance comment: supervision static standing without BUE support                             Pertinent Vitals/Pain Pain Assessment Pain Assessment: Faces Faces Pain Scale: Hurts a little bit Pain Location: HA Pain Descriptors / Indicators: Headache Pain Intervention(s): Monitored during session    Home Living Family/patient expects to be discharged to:: Private residence Living Arrangements: Alone Available Help at Discharge: Friend(s);Available PRN/intermittently Type of Home:  Mobile home Home Access: Ramped entrance       Home Layout: One level Home Equipment: Rollator (4 wheels);BSC/3in1      Prior Function Prior Level of Function : Independent/Modified Independent;Driving;History of Falls (last six months)             Mobility Comments: Pt reports she's ambulatory with rollator in her home, notes she's still driving but has a friend to assist. Reports 6 falls in the past week but unsure cause of them. ADLs Comments: Pt performs cooking & cleaning but notes cleaning is becoming harder to do.     Hand Dominance        Extremity/Trunk Assessment   Upper Extremity Assessment Upper Extremity Assessment: Overall WFL for tasks assessed    Lower Extremity Assessment Lower Extremity Assessment: Generalized weakness       Communication   Communication: No difficulties  Cognition Arousal/Alertness: Awake/alert Behavior During Therapy: WFL for tasks assessed/performed Overall Cognitive Status: No family/caregiver present to determine baseline cognitive functioning                                 General Comments: Pt follows all commands throughout session        General Comments      Exercises     Assessment/Plan    PT Assessment Patient needs continued PT services  PT Problem List Decreased strength;Decreased activity tolerance;Decreased balance;Decreased mobility;Decreased knowledge of use of DME;Decreased safety awareness       PT Treatment Interventions DME instruction;Therapeutic exercise;Gait training;Balance training;Stair training;Functional mobility training;Therapeutic activities;Patient/family education;Neuromuscular re-education    PT Goals (Current goals can be found in the Care Plan section)  Acute Rehab PT Goals Patient Stated Goal: get better, return home PT Goal Formulation: With patient Time For Goal Achievement: 05/17/23 Potential to Achieve Goals: Good    Frequency Min 3X/week      Co-evaluation               AM-PAC PT "6 Clicks" Mobility  Outcome Measure Help needed turning from your back to your side while in a flat bed without using bedrails?: None Help needed moving from lying on your back to sitting on the side of a flat bed without using bedrails?: None Help needed moving to and from a bed to a chair (including a wheelchair)?: A Little   Help needed to walk in hospital room?: A Little Help needed climbing 3-5 steps with a railing? : A Little 6 Click Score: 17    End of Session   Activity Tolerance:  (limited 2/2 orthostatic hypotension) Patient left: in bed;with call bell/phone within reach;with bed alarm set (MD in room) Nurse Communication:  (notified MD, nurse & NT of pt's BP) PT Visit Diagnosis: History of falling (Z91.81);Unsteadiness on feet (R26.81);Muscle weakness (generalized) (M62.81)    Time: 1610-9604 PT Time Calculation (min) (ACUTE ONLY): 24 min   Charges:   PT Evaluation $  PT Eval Moderate Complexity: 1 Mod          Aleda Grana, PT, DPT 05/03/23, 10:13 AM   Sandi Mariscal 05/03/2023, 10:08 AM

## 2023-05-03 NOTE — ED Notes (Signed)
Pt given coke per request. 

## 2023-05-03 NOTE — Progress Notes (Signed)
Mobility Specialist Progress Note   05/03/23 1525  Orthostatic Lying   BP- Lying (!) 123/95  Orthostatic Sitting  BP- Sitting 127/66  Orthostatic Standing at 0 minutes  BP- Standing at 0 minutes 97/76  Orthostatic Standing at 3 minutes  BP- Standing at 3 minutes 113/83  Mobility  Activity Transferred from bed to chair;Stood at bedside  Level of Assistance Contact guard assist, steadying assist  Assistive Device Front wheel walker  Distance Ambulated (ft) 4 ft  Activity Response Tolerated well  Mobility Referral Yes  $Mobility charge 1 Mobility  Mobility Specialist Start Time (ACUTE ONLY) 1500  Mobility Specialist Stop Time (ACUTE ONLY) 1530  Mobility Specialist Time Calculation (min) (ACUTE ONLY) 30 min    Received in bed w/o complaint. Able to participate in mobility while obtaining orthostatics w/o having symptoms. Requested to sit in chair, no faults on transfer, call bell in reach and bed alarm on.   Frederico Hamman Mobility Specialist Please contact via SecureChat or  Rehab office at [phone]

## 2023-05-03 NOTE — Progress Notes (Signed)
New Admission Note:  Arrival Method: Stretcher Mental Orientation: Alert and oriented x4 Telemetry: Box 1 Assessment: Completed Skin: Warm and dry IV: NSL Pain: 4/10 gen Tubes: N/A Safety Measures: Safety Fall Prevention Plan initiated.  Admission: Completed 5 M  Orientation: Patient has been orientated to the room, unit and the staff. Welcome booklet given.  Family: N/A Orders have been reviewed and implemented. Will continue to monitor the patient. Call light has been placed within reach and bed alarm has been activated.   Guilford Shi BSN, RN  Phone Number: [phone]

## 2023-05-04 ENCOUNTER — Other Ambulatory Visit: Payer: Self-pay | Admitting: Physical Medicine and Rehabilitation

## 2023-05-04 DIAGNOSIS — R531 Weakness: Secondary | ICD-10-CM | POA: Diagnosis not present

## 2023-05-04 MED ORDER — CLONAZEPAM 0.25 MG PO TBDP
0.2500 mg | ORAL_TABLET | Freq: Every day | ORAL | Status: DC
  Administered 2023-05-04: 0.25 mg via ORAL
  Filled 2023-05-04: qty 1

## 2023-05-04 MED ORDER — OXYBUTYNIN CHLORIDE 5 MG PO TABS
2.5000 mg | ORAL_TABLET | Freq: Three times a day (TID) | ORAL | Status: DC
  Administered 2023-05-04 – 2023-05-05 (×4): 2.5 mg via ORAL
  Filled 2023-05-04 (×6): qty 0.5

## 2023-05-04 MED ORDER — PREGABALIN 75 MG PO CAPS
75.0000 mg | ORAL_CAPSULE | Freq: Every day | ORAL | Status: DC
  Administered 2023-05-04: 75 mg via ORAL
  Filled 2023-05-04: qty 1

## 2023-05-04 MED ORDER — QUETIAPINE FUMARATE 50 MG PO TABS: 50.0000 mg | ORAL_TABLET | Freq: Every day | ORAL | Status: DC

## 2023-05-04 MED ORDER — QUETIAPINE FUMARATE 100 MG PO TABS: 200.0000 mg | ORAL_TABLET | Freq: Every day | ORAL | Status: DC

## 2023-05-04 MED ORDER — MEMANTINE HCL 10 MG PO TABS
10.0000 mg | ORAL_TABLET | Freq: Two times a day (BID) | ORAL | Status: DC
  Administered 2023-05-04 – 2023-05-05 (×3): 10 mg via ORAL
  Filled 2023-05-04 (×3): qty 1

## 2023-05-04 MED ORDER — SODIUM CHLORIDE 0.9 % IV SOLN: INTRAVENOUS | Status: DC

## 2023-05-04 MED ORDER — QUETIAPINE FUMARATE 50 MG PO TABS
250.0000 mg | ORAL_TABLET | Freq: Every day | ORAL | Status: DC
  Administered 2023-05-04: 250 mg via ORAL
  Filled 2023-05-04: qty 1

## 2023-05-04 MED ORDER — BUPROPION HCL ER (XL) 150 MG PO TB24
150.0000 mg | ORAL_TABLET | Freq: Every day | ORAL | Status: DC
  Administered 2023-05-04 – 2023-05-05 (×2): 150 mg via ORAL
  Filled 2023-05-04 (×2): qty 1

## 2023-05-04 MED ORDER — AMITRIPTYLINE HCL 50 MG PO TABS
150.0000 mg | ORAL_TABLET | Freq: Every day | ORAL | Status: DC
  Administered 2023-05-04: 150 mg via ORAL
  Filled 2023-05-04: qty 3

## 2023-05-04 MED ORDER — FLUTICASONE PROPIONATE 50 MCG/ACT NA SUSP
1.0000 | Freq: Every day | NASAL | Status: DC
  Administered 2023-05-04 – 2023-05-05 (×2): 1 via NASAL
  Filled 2023-05-04: qty 16

## 2023-05-04 NOTE — Evaluation (Signed)
Occupational Therapy Evaluation Patient Details Name: Kendra Simmons MRN: [id number] DOB: 1951/04/16 Today's Date: 05/04/2023   History of Present Illness Pt is a 72 y/o F admitted on 05/02/23 after presenting with c/o falls. Pt notes falling 2 times that day & more than 5 times in the past week. UA was positive for pyuria. Pt is being treated for generalized weakness suspect from polypharmacy versus others, presumptive UTI. PMH: generalized anxiety/depression, obesity, GERD, HLD, insomnia, memory loss   Clinical Impression   Pt reports independence at baseline with ADLs and functional mobility, uses a rollator and lives alone. Pt has friend that assists with transportation. Pt currently needing set up - min guard A for ADLs, mod I for bed mobility and min guard for transfers with rollator. Pt orthostatic (see below), but denies symptoms during session. Pt presenting with impairments listed below, will follow acutely. Recommend HHOT at d/c.    BP supine 126/80 (94) HR 82, SpO2 100% on RA BP seated 123/78 (93), HR 84, SpO2 100% on RA BP standing 97/78 (85), HR 104, SpO2 100% on RA BP standing x3 mins 91/60 (69), HR 104, SpO2 100% on RA   Recommendations for follow up therapy are one component of a multi-disciplinary discharge planning process, led by the attending physician.  Recommendations may be updated based on patient status, additional functional criteria and insurance authorization.   Assistance Recommended at Discharge Set up Supervision/Assistance  Patient can return home with the following A little help with walking and/or transfers;A little help with bathing/dressing/bathroom;Assistance with cooking/housework;Direct supervision/assist for financial management;Direct supervision/assist for medications management;Assist for transportation;Help with stairs or ramp for entrance    Functional Status Assessment  Patient has had a recent decline in their functional status and  demonstrates the ability to make significant improvements in function in a reasonable and predictable amount of time.  Equipment Recommendations  None recommended by OT (pt has needed DME)    Recommendations for Other Services PT consult     Precautions / Restrictions Precautions Precautions: Fall Restrictions Weight Bearing Restrictions: No      Mobility Bed Mobility Overal bed mobility: Modified Independent                  Transfers Overall transfer level: Needs assistance Equipment used: Rollator (4 wheels) Transfers: Sit to/from Stand Sit to Stand: Min guard                  Balance Overall balance assessment: History of Falls, Needs assistance Sitting-balance support: Feet supported Sitting balance-Leahy Scale: Good     Standing balance support: No upper extremity supported Standing balance-Leahy Scale: Fair Standing balance comment: supervision static standing without BUE support                           ADL either performed or assessed with clinical judgement   ADL Overall ADL's : Needs assistance/impaired Eating/Feeding: Set up;Sitting   Grooming: Set up;Sitting   Upper Body Bathing: Minimal assistance   Lower Body Bathing: Minimal assistance   Upper Body Dressing : Minimal assistance   Lower Body Dressing: Minimal assistance   Toilet Transfer: Min guard;Ambulation;Rollator (4 wheels);Regular Toilet   Toileting- Clothing Manipulation and Hygiene: Min guard       Functional mobility during ADLs: Min guard;Rollator (4 wheels)       Vision   Vision Assessment?: No apparent visual deficits     Perception Perception Perception Tested?: No   Praxis Praxis  Praxis tested?: Not tested    Pertinent Vitals/Pain Pain Assessment Pain Assessment: 0-10 Pain Score: 4  Faces Pain Scale: Hurts little more Pain Location: headache Pain Descriptors / Indicators: Headache Pain Intervention(s): Limited activity within patient's  tolerance, Monitored during session     Hand Dominance Right   Extremity/Trunk Assessment Upper Extremity Assessment Upper Extremity Assessment: Generalized weakness   Lower Extremity Assessment Lower Extremity Assessment: Defer to PT evaluation   Cervical / Trunk Assessment Cervical / Trunk Assessment: Normal   Communication Communication Communication: No difficulties   Cognition Arousal/Alertness: Awake/alert Behavior During Therapy: WFL for tasks assessed/performed Overall Cognitive Status: History of cognitive impairments - at baseline                                 General Comments: Pt follows all commands throughout session, baseline dementia     General Comments  VSS on RA    Exercises     Shoulder Instructions      Home Living Family/patient expects to be discharged to:: Private residence Living Arrangements: Alone Available Help at Discharge: Friend(s);Available PRN/intermittently Type of Home: Mobile home Home Access: Ramped entrance     Home Layout: One level         Bathroom Toilet: Standard     Home Equipment: Rollator (4 wheels);BSC/3in1;Shower seat          Prior Functioning/Environment Prior Level of Function : Independent/Modified Independent;Driving;History of Falls (last six months)             Mobility Comments: Pt reports she's ambulatory with rollator in her home, notes she's still driving but has a friend to assist. Reports 6 falls in the past week but unsure cause of them. ADLs Comments: Pt performs cooking & cleaning but notes cleaning is becoming harder to do.        OT Problem List: Decreased range of motion;Decreased strength;Decreased activity tolerance;Impaired balance (sitting and/or standing);Impaired vision/perception;Decreased cognition      OT Treatment/Interventions: Self-care/ADL training;Therapeutic exercise;Energy conservation;DME and/or AE instruction;Therapeutic activities;Patient/family  education;Balance training    OT Goals(Current goals can be found in the care plan section) Acute Rehab OT Goals Patient Stated Goal: none stated OT Goal Formulation: With patient Time For Goal Achievement: 05/18/23 Potential to Achieve Goals: Good ADL Goals Pt Will Perform Upper Body Dressing: Independently;sitting Pt Will Perform Lower Body Dressing: Independently;sit to/from stand;sitting/lateral leans Pt Will Transfer to Toilet: Independently;ambulating;regular height toilet Pt Will Perform Tub/Shower Transfer: Tub transfer;Shower transfer;Independently;ambulating;shower seat  OT Frequency: Min 1X/week    Co-evaluation              AM-PAC OT "6 Clicks" Daily Activity     Outcome Measure Help from another person eating meals?: None Help from another person taking care of personal grooming?: A Little Help from another person toileting, which includes using toliet, bedpan, or urinal?: A Little Help from another person bathing (including washing, rinsing, drying)?: A Little Help from another person to put on and taking off regular upper body clothing?: A Little Help from another person to put on and taking off regular lower body clothing?: A Little 6 Click Score: 19   End of Session Equipment Utilized During Treatment: Gait belt;Rollator (4 wheels) Nurse Communication: Mobility status;Other (comment) (BP)  Activity Tolerance: Patient tolerated treatment well Patient left: in chair;with call bell/phone within reach;with chair alarm set  OT Visit Diagnosis: Unsteadiness on feet (R26.81);Other abnormalities of gait and mobility (R26.89);Repeated  falls (R29.6);Muscle weakness (generalized) (M62.81);History of falling (Z91.81)                Time: 1610-9604 OT Time Calculation (min): 30 min Charges:  OT General Charges $OT Visit: 1 Visit OT Evaluation $OT Eval Moderate Complexity: 1 Mod OT Treatments $Self Care/Home Management : 8-22 mins  Carver Fila, OTD, OTR/L SecureChat  Preferred Acute Rehab [phone]   Dalphine Handing 05/04/2023, 12:48 PM

## 2023-05-04 NOTE — Progress Notes (Signed)
PROGRESS NOTE  Kendra Simmons  DOB: 05/29/1951  PCP: Aliene Beams, MD WGN:[id number]  DOA: 05/02/2023  LOS: 2 days  Hospital Day: 3  Brief narrative: Kendra Simmons is a 72 y.o. female with PMH significant for obesity, HLD, mild dementia, anxiety/depression, arthritis, GERD 5/16, patient presented to the ED with complaint of progressively worsening weakness for several weeks.  She mentions that she has had multiple episodes of dizziness leading to fall while trying to stand up and had to call EMS multiple times. On the day of presentation, she fell twice, reportedly failed more than 5 times in the past week.  In the ED, vital signs are stable. Neurological exam was unremarkable CT head and CT cervical neck were unremarkable for acute findings Labs with unremarkable CBC, mildly elevated creatinine Urinalysis showed clear yellow urine, negative ketones, no bacteria but moderate leukocytes She was started on IV fluid and admitted to [facility]  Subjective: Patient was seen and examined this morning.  Pleasant elderly Caucasian female.  Lying down on bed.  Not in distress. She states she could not sleep last night because she did not get up psych meds. Also reported bladder spasms from not getting Flomax.  Assessment and plan: Orthostatic hypotension and syncope Frequent falls Generalized weakness Patient seems to have frequent orthostatic hypotension and syncope leading to frequent falls. 5/17, she was noted to have significant drop in blood pressure from 112/68 on supine to 57/49 when standing 3 minutes. Started on IV fluid which I will continue.  Continue to monitor orthostatic vital signs. Flomax on hold.  I also held her psych meds but patient is requesting to resume them.  I have resumed them.  Continue to monitor symptoms.   PT eval obtained.  Home with PT recommended. Encourage out of bed   Dementia  anxiety/depression PTA on Namenda 10 mg twice daily,  amitriptyline 150 mg nightly, Wellbutrin 150 mg daily, Lyrica 75 mg nightly, cervical 250 mg nightly, Klonopin 0.5 mg nightly, Robaxin 500 mg every 6 hours as needed. I had extensive discussions with patient regarding her use of multiple psych meds potentially leading to orthostatic hypotension.  Patient states she has struggled with her mental health for a long time and has been on these medicines would like to continue them. She needs to follow-up with psychiatry as an outpatient.  UTI Bladder spasms/urinary urgency Patient is chronic urinary frequency, notes from anuresis and bladder spasms, acutely worse for few days prior to presentation.   Urinalysis showed pyuria. Currently on IV Rocephin Flomax on hold.  Start Ditropan.  AKI on CKD 3B Creatinine elevated 1.37 on admission.  Gradually improved with IV fluid.  Continue to monitor Recent Labs    08/02/22 1921 09/11/22 1433 10/18/22 2020 10/19/22 0127 10/21/22 1146 10/26/22 0921 05/02/23 1535 05/02/23 2306 05/03/23 1016  BUN 37* 17 16  --  13  --  31*  --  25*  CREATININE 1.57* 0.93 1.01* 0.90 0.95 0.94 1.37* 1.18* 1.12*   Hyperlipidemia Continue Crestor   GERD Continue PPI  Goals of care   Code Status: Full Code     DVT prophylaxis:  enoxaparin (LOVENOX) injection 40 mg Start: 05/03/23 1000   Antimicrobials: IV Rocephin Fluid: NS at 75 mill per hour Consultants: None Family Communication: None at bedside  Status: Inpatient Level of care:  Med-Surg   Patient from: Home Anticipated d/c to: Home with PT in 1 to 2 days Needs to continue in-hospital care:  Remains orthostatic.  Needs IV  fluid    Diet:  Diet Order             Diet regular Fluid consistency: Thin  Diet effective now                   Scheduled Meds:  amitriptyline  150 mg Oral QHS   buPROPion  150 mg Oral Daily   clonazepam  0.25 mg Oral QHS   enoxaparin (LOVENOX) injection  40 mg Subcutaneous Q24H   fluticasone  1 spray Each  Nare Daily   memantine  10 mg Oral BID   oxybutynin  2.5 mg Oral TID   pantoprazole  40 mg Oral Daily   polyethylene glycol  17 g Oral QODAY   pregabalin  75 mg Oral QHS   QUEtiapine  250 mg Oral QHS   rosuvastatin  10 mg Oral Daily    PRN meds: acetaminophen, methocarbamol, mouth rinse, polyethylene glycol, prochlorperazine   Infusions:   sodium chloride 75 mL/hr at 05/04/23 0928   cefTRIAXone (ROCEPHIN)  IV 2 g (05/04/23 1610)    Antimicrobials: Anti-infectives (From admission, onward)    Start     Dose/Rate Route Frequency Ordered Stop   05/03/23 0530  cefTRIAXone (ROCEPHIN) 2 g in sodium chloride 0.9 % 100 mL IVPB        2 g 200 mL/hr over 30 Minutes Intravenous Every 24 hours 05/03/23 0441         Nutritional status:  Body mass index is 31.37 kg/m.          Objective: Vitals:   05/04/23 0453 05/04/23 0939  BP: 121/78 (!) 136/52  Pulse: 77 79  Resp: 15 18  Temp: 97.8 F (36.6 C) 98 F (36.7 C)  SpO2: 98% 100%    Intake/Output Summary (Last 24 hours) at 05/04/2023 1026 Last data filed at 05/04/2023 0800 Gross per 24 hour  Intake 2217.57 ml  Output 550 ml  Net 1667.57 ml   Filed Weights   05/02/23 1530 05/03/23 0000  Weight: 83.9 kg 82.9 kg   Weight change:  Body mass index is 31.37 kg/m.   Physical Exam: General exam: Pleasant, elderly Caucasian female.  Not in pain Skin: No rashes, lesions or ulcers. HEENT: Atraumatic, normocephalic, no obvious bleeding Lungs: Clear to auscultation bilaterally CVS: Regular rate and rhythm, no murmur GI/Abd soft, nontender, nondistended, bowels are present CNS: Look sleepy because of lack of sleep last night.  Psychiatry: Mood appropriate. Extremities: No pedal edema, no calf tenderness  Data Review: I have personally reviewed the laboratory data and studies available.  F/u labs ordered Unresulted Labs (From admission, onward)     Start     Ordered   05/09/23 0500  Creatinine, serum  (enoxaparin  (LOVENOX)    CrCl >/= 30 ml/min)  Weekly,   R     Comments: while on enoxaparin therapy    05/02/23 2254   05/03/23 0442  Urinalysis, w/ Reflex to Culture (Infection Suspected) -Urine, Clean Catch  (Urine Labs)  Once,   R       Question:  Specimen Source  Answer:  Urine, Clean Catch   05/03/23 0441            Total time spent in review of labs and imaging, patient evaluation, formulation of plan, documentation and communication with family: 45 minutes  Signed, Lorin Glass, MD [facility] 05/04/2023

## 2023-05-05 DIAGNOSIS — R531 Weakness: Secondary | ICD-10-CM | POA: Diagnosis not present

## 2023-05-05 MED ORDER — OXYBUTYNIN CHLORIDE 2.5 MG PO TABS: 2.5000 mg | ORAL_TABLET | Freq: Two times a day (BID) | ORAL | 0 refills | Status: DC | PRN

## 2023-05-05 NOTE — TOC Transition Note (Signed)
Transition of Care [facility]) - CM/SW Discharge Note   Patient Details  Name: Kendra Simmons MRN: [id number] Date of Birth: [birth year]  [facility] of [facility] CM/SW Contact:  Norvel Richards, RN Phone Number: 05/05/2023, 2:00 PM   Clinical Narrative:  Spoke to patient via phone regarding discharge plan for home PT. Patient is verbally resposive, engaging and pleasant. She is in agreement with the plan. Contacted [facility] for services. Spoke to [phone] who agrees to provide services. No further need identified.     Final next level of care: Home w Home Health Services Barriers to Discharge: No Barriers Identified   Patient Goals and CMS Choice CMS Medicare.gov Compare Post Acute Care list provided to:: Patient Choice offered to / list presented to : Patient  Discharge Placement                         Discharge Plan and Services Additional resources added to the After Visit Summary for     Discharge Planning Services: CM Consult Post Acute Care Choice: Home Health          DME Arranged: N/A DME Agency: NA       HH Arranged: PT   Date HH Agency Contacted: 05/05/23 Time HH Agency Contacted: [id number] Representative spoke with at [facility] Agency: Mayra Reel- [phone]  Social Determinants of Health (SDOH) Interventions SDOH Screenings   Food Insecurity: No Food Insecurity (05/03/2023)  Housing: Low Risk  (05/03/2023)  Transportation Needs: No Transportation Needs (05/03/2023)  Utilities: Not At Risk (05/03/2023)  Depression (PHQ2-9): Low Risk  (12/18/2022)  Tobacco Use: Low Risk  (05/03/2023)     Readmission Risk Interventions    05/03/2023   12:51 PM 10/30/2022   11:39 AM  Readmission Risk Prevention Plan  Transportation Screening Complete Complete  PCP or Specialist Appt within 5-7 Days  Complete  PCP or Specialist Appt within 3-5 Days Complete   Home Care Screening  Complete  Medication Review (RN CM)  Complete  HRI or Home  Care Consult Complete   Social Work Consult for Recovery Care Planning/Counseling Complete   Palliative Care Screening Not Applicable   Medication Review Oceanographer) Referral to Pharmacy

## 2023-05-05 NOTE — Discharge Summary (Signed)
Physician Discharge Summary  Kendra Simmons ZOX:[id number] DOB: 19-Jun-1951 DOA: 05/02/2023  PCP: Aliene Beams, MD  Admit date: 05/02/2023 Discharge date: 05/05/2023  Admitted From: Home Discharge disposition: Home with home health PT  Recommendations at discharge:  You have significant orthostatic hypotension which can lead to dizziness and falls.  Maintain adequate hydration.  There are some medicines on your list which are contributing to worsening orthostatic hypotension.  I have switched Flomax to Ditropan.  I would recommend to minimize use of mood altering medications.  You need to follow-up with your PCP as well as mental provider for reassessment and at this point.   Brief narrative: Kendra Simmons is a 72 y.o. female with PMH significant for obesity, HLD, mild dementia, anxiety/depression, arthritis, GERD 5/16, patient presented to the ED with complaint of progressively worsening weakness for several weeks.  She mentions that she has had multiple episodes of dizziness leading to fall while trying to stand up and had to call EMS multiple times. On the day of presentation, she fell twice, reportedly failed more than 5 times in the past week.  In the ED, vital signs are stable. Neurological exam was unremarkable CT head and CT cervical neck were unremarkable for acute findings Labs with unremarkable CBC, mildly elevated creatinine Urinalysis showed clear yellow urine, negative ketones, no bacteria but moderate leukocytes She was started on IV fluid and admitted to [facility] Orthostatic vital signs were noted to be positive.  Started on IV fluid. See below for details  Subjective: Patient was seen and examined this morning.  Pleasant elderly Caucasian female.  Lying down on bed.  Not in distress. Slept somewhat last night but not a whole lot because she said she did not get to feel on exact time that she used to at home.  Assessment and plan: Orthostatic  hypotension and syncope Frequent falls Generalized weakness Patient seems to have frequent orthostatic hypotension and syncope leading to frequent falls. 5/17, she was noted to have significant drop in blood pressure from 112/68 on supine to 57/49 when standing 3 minutes.  She was started on IV fluid.  Orthostatic vital signs were monitored.  Patient continues to have orthostatic drop and symptoms but less severe and improving with hydration. I believe her multiple psychiatric medications as well as Flomax have been contributing to orthostatic hypertension.  Patient is reluctant to cut back on psych meds but she agreed to switch from Flomax to Ditropan.  Because of chronic urinary urgency and frequency, patient also compromises oral hydration.  I will recommend to maintain adequate hydration. PT eval obtained.  Home with PT recommended. Encourage out of bed   Dementia  anxiety/depression PTA on Namenda 10 mg twice daily, amitriptyline 150 mg nightly, Wellbutrin 150 mg daily, Lyrica 75 mg nightly, amitriptyline 250 mg nightly, Klonopin 0.5 mg nightly, Robaxin 500 mg every 6 hours as needed. I had extensive discussions with patient regarding her use of multiple psych meds potentially leading to orthostatic hypotension.  Patient states she has struggled with her mental health for a long time and has been on these medicines would like to continue them. She needs to follow-up with psychiatry as an outpatient.  UTI Bladder spasms/urinary urgency Patient is chronic urinary frequency, notes from anuresis and bladder spasms, acutely worse for few days prior to presentation.   Urinalysis showed pyuria. Completed 3-day course of IV Rocephin. Switched from Flomax to Ditropan..  AKI on CKD 3B Creatinine elevated 1.37 on admission.  Gradually improved with  IV fluid.   Recent Labs    08/02/22 1921 09/11/22 1433 10/18/22 2020 10/19/22 0127 10/21/22 1146 10/26/22 0921 05/02/23 1535 05/02/23 2306  05/03/23 1016  BUN 37* 17 16  --  13  --  31*  --  25*  CREATININE 1.57* 0.93 1.01* 0.90 0.95 0.94 1.37* 1.18* 1.12*   Hyperlipidemia Continue Crestor   GERD Continue PPI  Goals of care   Code Status: Full Code   Wounds:    Discharge Exam:   Vitals:   05/04/23 1757 05/04/23 1933 05/05/23 0512 05/05/23 0939  BP: (!) 126/99 (!) 144/64 123/61 (!) 128/58  Pulse: 77 81 77 78  Resp: 17 17 20 17   Temp: 98.4 F (36.9 C) 98.1 F (36.7 C) 98.8 F (37.1 C) 98.5 F (36.9 C)  TempSrc:  Oral    SpO2: 100% 100% 97% 96%  Weight:      Height:        Body mass index is 31.37 kg/m.  General exam: Pleasant, elderly Caucasian female.  Not in distress. Skin: No rashes, lesions or ulcers. HEENT: Atraumatic, normocephalic, no obvious bleeding Lungs: Clear to auscultation bilaterally CVS: Regular rate and rhythm, no murmur GI/Abd soft, nontender, nondistended, bowels are present CNS: Alert, awake, oriented x 3. Psychiatry: Mood appropriate. Extremities: No pedal edema, no calf tenderness  Follow ups:    Follow-up Information     Aliene Beams, MD Follow up.   Specialty: Family Medicine Contact information: [address] [place] [address] [phone]                 Discharge Instructions:   Discharge Instructions     Call MD for:  difficulty breathing, headache or visual disturbances   Complete by: As directed    Call MD for:  extreme fatigue   Complete by: As directed    Call MD for:  hives   Complete by: As directed    Call MD for:  persistant dizziness or light-headedness   Complete by: As directed    Call MD for:  persistant nausea and vomiting   Complete by: As directed    Call MD for:  severe uncontrolled pain   Complete by: As directed    Call MD for:  temperature >100.4   Complete by: As directed    Diet general   Complete by: As directed    Discharge instructions   Complete by: As directed    Recommendations at discharge:   You have  significant orthostatic hypotension which can lead to dizziness and falls.  Maintain adequate hydration.  There are some medicines on your list which are contributing to worsening orthostatic hypotension.  I have switched Flomax to Ditropan.  I would recommend to minimize use of mood altering medications.  You need to follow-up with your PCP as well as mental provider for reassessment and at this point.   General discharge instructions: Follow with Primary MD Aliene Beams, MD in 7 days  Please request your PCP  to go over your hospital tests, procedures, radiology results at the follow up. Please get your medicines reviewed and adjusted.  Your PCP may decide to repeat certain labs or tests as needed. Do not drive, operate heavy machinery, perform activities at heights, swimming or participation in water activities or provide baby sitting services if your were admitted for syncope or siezures until you have seen by Primary MD or a Neurologist and advised to do so again. [employer] [place] Controlled Substance Reporting System database  was reviewed. Do not drive, operate heavy machinery, perform activities at heights, swim, participate in water activities or provide baby-sitting services while on medications for pain, sleep and mood until your outpatient physician has reevaluated you and advised to do so again.  You are strongly recommended to comply with the dose, frequency and duration of prescribed medications. Activity: As tolerated with Full fall precautions use walker/cane & assistance as needed Avoid using any recreational substances like cigarette, tobacco, alcohol, or non-prescribed drug. If you experience worsening of your admission symptoms, develop shortness of breath, life threatening emergency, suicidal or homicidal thoughts you must seek medical attention immediately by calling 911 or calling your MD immediately  if symptoms less severe. You must read complete instructions/literature along with  all the possible adverse reactions/side effects for all the medicines you take and that have been prescribed to you. Take any new medicine only after you have completely understood and accepted all the possible adverse reactions/side effects.  Wear Seat belts while driving. You were cared for by a hospitalist during your hospital stay. If you have any questions about your discharge medications or the care you received while you were in the hospital after you are discharged, you can call the unit and ask to speak with the hospitalist or the covering physician. Once you are discharged, your primary care physician will handle any further medical issues. Please note that NO REFILLS for any discharge medications will be authorized once you are discharged, as it is imperative that you return to your primary care physician (or establish a relationship with a primary care physician if you do not have one).   Increase activity slowly   Complete by: As directed        Discharge Medications:   Allergies as of 05/05/2023       Reactions   Phentermine Anxiety   Augmentin [amoxicillin-pot Clavulanate] Diarrhea        Medication List     STOP taking these medications    busPIRone 5 MG tablet Commonly known as: BUSPAR   erythromycin ophthalmic ointment   hydrOXYzine 10 MG tablet Commonly known as: ATARAX   ibuprofen 200 MG tablet Commonly known as: ADVIL   meloxicam 7.5 MG tablet Commonly known as: MOBIC   Nurtec 75 MG Tbdp Generic drug: Rimegepant Sulfate   sucralfate 1 GM/10ML suspension Commonly known as: CARAFATE   tamsulosin 0.4 MG Caps capsule Commonly known as: FLOMAX   tiZANidine 4 MG tablet Commonly known as: ZANAFLEX   traMADol 50 MG tablet Commonly known as: ULTRAM       TAKE these medications    acetaminophen 500 MG tablet Commonly known as: TYLENOL Take 2 tablets (1,000 mg total) by mouth every 6 (six) hours as needed.   amitriptyline 150 MG tablet Commonly  known as: ELAVIL Take 1 tablet (150 mg total) by mouth at bedtime.   buPROPion 150 MG 24 hr tablet Commonly known as: WELLBUTRIN XL TAKE 1 TABLET BY MOUTH EVERY DAY IN THE MORNING What changed:  how much to take how to take this when to take this additional instructions   CALCIUM CITRATE + D3 PO Take 1 tablet by mouth daily.   clonazePAM 0.5 MG tablet Commonly known as: KlonoPIN Take 1 tablet (0.5 mg total) by mouth at bedtime as needed for anxiety.   diclofenac Sodium 1 % Gel Commonly known as: VOLTAREN APPLY 2 GRAMS TO AFFECTED AREA 4 TIMES A DAY What changed: See the new instructions.   fluticasone 50  MCG/ACT nasal spray Commonly known as: FLONASE Place 1 spray into both nostrils daily.   hyoscyamine 0.125 MG Tbdp disintergrating tablet Commonly known as: ANASPAZ Place 0.125 mg under the tongue daily as needed (Choking eposide per patient).   memantine 10 MG tablet Commonly known as: NAMENDA Take 1 tablet (10 mg total) by mouth 2 (two) times daily.   methocarbamol 500 MG tablet Commonly known as: ROBAXIN Take 1 tablet (500 mg total) by mouth every 6 (six) hours as needed for muscle spasms.   MULTIVITAMIN PO Take 1 tablet by mouth daily. Centrum 50 +   nystatin powder Commonly known as: MYCOSTATIN/NYSTOP Apply 1 Application topically 2 (two) times daily as needed (for dry skin).   omeprazole 40 MG capsule Commonly known as: PRILOSEC Take 40 mg by mouth daily.   ondansetron 4 MG disintegrating tablet Commonly known as: Zofran ODT Take 1 tablet (4 mg total) by mouth every 8 (eight) hours as needed for nausea or vomiting.   oxyBUTYnin Chloride 2.5 MG Tabs Take 2.5 mg by mouth every 12 (twelve) hours as needed for bladder spasms.   polycarbophil 625 MG tablet Commonly known as: FIBERCON Take 625 mg by mouth daily. gummy   polyethylene glycol 17 g packet Commonly known as: MIRALAX / GLYCOLAX Take 17 g by mouth every other day.   pregabalin 75 MG  capsule Commonly known as: Lyrica Take 1 capsule (75 mg total) by mouth at bedtime.   QUEtiapine 200 MG tablet Commonly known as: SEROquel Take 1 tablet (200 mg total) by mouth at bedtime.   QUEtiapine 50 MG tablet Commonly known as: SEROQUEL Take 1 tablet (50 mg total) by mouth at bedtime.   rosuvastatin 10 MG tablet Commonly known as: CRESTOR TAKE 1 TABLET BY MOUTH EVERY DAY         The results of significant diagnostics from this hospitalization (including imaging, microbiology, ancillary and laboratory) are listed below for reference.    Procedures and Diagnostic Studies:   CT Cervical Spine Wo Contrast  Result Date: 05/02/2023 CLINICAL DATA:  Fall.  Neck trauma. EXAM: CT CERVICAL SPINE WITHOUT CONTRAST TECHNIQUE: Multidetector CT imaging of the cervical spine was performed without intravenous contrast. Multiplanar CT image reconstructions were also generated. RADIATION DOSE REDUCTION: This exam was performed according to the departmental dose-optimization program which includes automated exposure control, adjustment of the mA and/or kV according to patient size and/or use of iterative reconstruction technique. COMPARISON:  CT of the cervical spine 10/18/2022 FINDINGS: Alignment: No significant listhesis is present. Straightening of the normal cervical lordosis is present. Skull base and vertebrae: Craniocervical junction is normal. The vertebral body heights are normal. No acute fractures are present. Soft tissues and spinal canal: No prevertebral fluid or swelling. No visible canal hematoma. Disc levels: Endplate degenerative changes are stable, greatest at C5-6 and C6-7. Asymmetric left foraminal narrowing is present at C6-7. Upper chest: The lung apices are clear. The thoracic inlet is within normal limits. IMPRESSION: 1. No acute fracture or traumatic subluxation. 2. Stable multilevel degenerative changes of the cervical spine. Electronically Signed   By: Marin Roberts M.D.    On: 05/02/2023 17:16   CT Head Wo Contrast  Result Date: 05/02/2023 CLINICAL DATA:  Head trauma.  Fall. EXAM: CT HEAD WITHOUT CONTRAST TECHNIQUE: Contiguous axial images were obtained from the base of the skull through the vertex without intravenous contrast. RADIATION DOSE REDUCTION: This exam was performed according to the departmental dose-optimization program which includes automated exposure control, adjustment of the  mA and/or kV according to patient size and/or use of iterative reconstruction technique. COMPARISON:  CT head without contrast 10/18/2022 FINDINGS: Brain: No acute infarct, hemorrhage, or mass lesion is present. No significant white matter lesions are present. The ventricles are of normal size. No significant extraaxial fluid collection is present. The brainstem and cerebellum are within normal limits. Midline structures are within normal limits. Vascular: No hyperdense vessel or unexpected calcification. Skull: Calvarium is intact. No focal lytic or blastic lesions are present. Left supraorbital scalp soft tissue swelling is present. No underlying fracture is present. Sinuses/Orbits: Right frontal sinus opacification appears chronic. The paranasal sinuses and mastoid air cells are otherwise clear. The globes and orbits are within normal limits. IMPRESSION: 1. Left supraorbital scalp soft tissue swelling without underlying fracture. 2. Normal CT appearance of the brain. Electronically Signed   By: Marin Roberts M.D.   On: 05/02/2023 17:14   DG Chest 2 View  Result Date: 05/02/2023 CLINICAL DATA:  Chest pain. EXAM: CHEST - 2 VIEW COMPARISON:  None Available. FINDINGS: The heart size and mediastinal contours are within normal limits. Both lungs are clear. The visualized skeletal structures are unremarkable. IMPRESSION: No active cardiopulmonary disease. Electronically Signed   By: Larose Hires D.O.   On: 05/02/2023 16:22   DG Hip Unilat With Pelvis 2-3 Views Right  Result Date:  05/02/2023 CLINICAL DATA:  Right hip pain EXAM: DG HIP (WITH OR WITHOUT PELVIS) 2-3V RIGHT COMPARISON:  None Available. FINDINGS: There is no evidence of hip fracture or dislocation. Superolateral hip joint space narrowing with marginal spurring. IMPRESSION: No evidence of fracture or dislocation. Mild right hip osteoarthritis. Electronically Signed   By: Larose Hires D.O.   On: 05/02/2023 16:21     Labs:   Basic Metabolic Panel: Recent Labs  Lab 05/02/23 1535 05/02/23 2306 05/03/23 1016  NA 136  --  141  K 4.3  --  3.9  CL 104  --  106  CO2 19*  --  25  GLUCOSE 154*  --  121*  BUN 31*  --  25*  CREATININE 1.37* 1.18* 1.12*  CALCIUM 9.3  --  8.8*  MG  --   --  1.9  PHOS  --   --  3.7   GFR Estimated Creatinine Clearance: 47.3 mL/min (A) (by C-G formula based on SCr of 1.12 mg/dL (H)). Liver Function Tests: Recent Labs  Lab 05/02/23 1535  AST 25  ALT 19  ALKPHOS 74  BILITOT 0.5  PROT 7.1  ALBUMIN 4.0   No results for input(s): "LIPASE", "AMYLASE" in the last 168 hours. No results for input(s): "AMMONIA" in the last 168 hours. Coagulation profile No results for input(s): "INR", "PROTIME" in the last 168 hours.  CBC: Recent Labs  Lab 05/02/23 1535 05/02/23 2306 05/03/23 1016  WBC 5.4 4.7 3.8*  NEUTROABS 3.4  --   --   HGB 12.8 12.2 11.1*  HCT 40.7 38.5 33.7*  MCV 99.5 97.0 96.0  PLT 205 148* 124*   Cardiac Enzymes: No results for input(s): "CKTOTAL", "CKMB", "CKMBINDEX", "TROPONINI" in the last 168 hours. BNP: Invalid input(s): "POCBNP" CBG: Recent Labs  Lab 05/02/23 2310  GLUCAP 88   D-Dimer No results for input(s): "DDIMER" in the last 72 hours. Hgb A1c No results for input(s): "HGBA1C" in the last 72 hours. Lipid Profile No results for input(s): "CHOL", "HDL", "LDLCALC", "TRIG", "CHOLHDL", "LDLDIRECT" in the last 72 hours. Thyroid function studies No results for input(s): "TSH", "T4TOTAL", "T3FREE", "THYROIDAB" in  the last 72 hours.  Invalid  input(s): "FREET3" Anemia work up No results for input(s): "VITAMINB12", "FOLATE", "FERRITIN", "TIBC", "IRON", "RETICCTPCT" in the last 72 hours. Microbiology No results found for this or any previous visit (from the past 240 hour(s)).  Time coordinating discharge: 45 minutes  Signed: Holland Nickson  [facility] 05/05/2023, 10:28 AM

## 2023-05-05 NOTE — Progress Notes (Signed)
PROGRESS NOTE  [name]  DOB: 03/09/1951  PCP: Aliene Beams, MD UJW:[id number]  DOA: 05/02/2023  LOS: 3 days  Hospital Day: 4  Brief narrative: Kendra Simmons is a 72 y.o. female with PMH significant for obesity, HLD, mild dementia, anxiety/depression, arthritis, GERD 5/16, patient presented to the ED with complaint of progressively worsening weakness for several weeks.  She mentions that she has had multiple episodes of dizziness leading to fall while trying to stand up and had to call EMS multiple times. On the day of presentation, she fell twice, reportedly failed more than 5 times in the past week.  In the ED, vital signs are stable. Neurological exam was unremarkable CT head and CT cervical neck were unremarkable for acute findings Labs with unremarkable CBC, mildly elevated creatinine Urinalysis showed clear yellow urine, negative ketones, no bacteria but moderate leukocytes She was started on IV fluid and admitted to [facility] Orthostatic vital signs were noted to be positive.  Started on IV fluid. See below for details  Subjective: Patient was seen and examined this morning.  Pleasant elderly Caucasian female.  Lying down on bed.  Not in distress. Slept somewhat last night but not a whole lot because she said she did not get to feel on exact time that she used to at home.  Assessment and plan: Orthostatic hypotension and syncope Frequent falls Generalized weakness Patient seems to have frequent orthostatic hypotension and syncope leading to frequent falls. 5/17, she was noted to have significant drop in blood pressure from 112/68 on supine to 57/49 when standing 3 minutes.  She was started on IV fluid.  Orthostatic vital signs were monitored.  Patient continues to have orthostatic drop and symptoms but less severe and improving with hydration. I believe her multiple psychiatric medications as well as Flomax have been contributing to oxalate hypertension.   Patient is reluctant to cut back on psych meds but she agreed to switch from Flomax to Ditropan.   PT eval obtained.  Home with PT recommended. Encourage out of bed   Dementia  anxiety/depression PTA on Namenda 10 mg twice daily, amitriptyline 150 mg nightly, Wellbutrin 150 mg daily, Lyrica 75 mg nightly, cervical 250 mg nightly, Klonopin 0.5 mg nightly, Robaxin 500 mg every 6 hours as needed. I had extensive discussions with patient regarding her use of multiple psych meds potentially leading to orthostatic hypotension.  Patient states she has struggled with her mental health for a long time and has been on these medicines would like to continue them. She needs to follow-up with psychiatry as an outpatient.  UTI Bladder spasms/urinary urgency Patient is chronic urinary frequency, notes from anuresis and bladder spasms, acutely worse for few days prior to presentation.   Urinalysis showed pyuria. Completed 3-day course of IV Rocephin. Switched from Flomax to Ditropan..  AKI on CKD 3B Creatinine elevated 1.37 on admission.  Gradually improved with IV fluid.   Recent Labs    08/02/22 1921 09/11/22 1433 10/18/22 2020 10/19/22 0127 10/21/22 1146 10/26/22 0921 05/02/23 1535 05/02/23 2306 05/03/23 1016  BUN 37* 17 16  --  13  --  31*  --  25*  CREATININE 1.57* 0.93 1.01* 0.90 0.95 0.94 1.37* 1.18* 1.12*   Hyperlipidemia Continue Crestor   GERD Continue PPI  Goals of care   Code Status: Full Code     DVT prophylaxis:  enoxaparin (LOVENOX) injection 40 mg Start: 05/03/23 1000   Antimicrobials: IV Rocephin Fluid: NS at 75 mill per hour Consultants:  None Family Communication: None at bedside  Status: Inpatient Level of care:  Med-Surg   Patient from: Home Anticipated d/c to: Home with PT in 1 to 2 days Needs to continue in-hospital care:  Remains orthostatic.  Needs IV fluid    Diet:  Diet Order             Diet regular Fluid consistency: Thin  Diet effective  now                   Scheduled Meds:  amitriptyline  150 mg Oral QHS   buPROPion  150 mg Oral Daily   clonazepam  0.25 mg Oral QHS   enoxaparin (LOVENOX) injection  40 mg Subcutaneous Q24H   fluticasone  1 spray Each Nare Daily   memantine  10 mg Oral BID   oxybutynin  2.5 mg Oral TID   pantoprazole  40 mg Oral Daily   polyethylene glycol  17 g Oral QODAY   pregabalin  75 mg Oral QHS   QUEtiapine  250 mg Oral QHS   rosuvastatin  10 mg Oral Daily    PRN meds: acetaminophen, methocarbamol, mouth rinse, polyethylene glycol, prochlorperazine   Infusions:   sodium chloride 75 mL/hr at 05/04/23 0928   cefTRIAXone (ROCEPHIN)  IV 2 g (05/05/23 0540)    Antimicrobials: Anti-infectives (From admission, onward)    Start     Dose/Rate Route Frequency Ordered Stop   05/03/23 0530  cefTRIAXone (ROCEPHIN) 2 g in sodium chloride 0.9 % 100 mL IVPB        2 g 200 mL/hr over 30 Minutes Intravenous Every 24 hours 05/03/23 0441         Nutritional status:  Body mass index is 31.37 kg/m.          Objective: Vitals:   05/05/23 0512 05/05/23 0939  BP: 123/61 (!) 128/58  Pulse: 77 78  Resp: 20 17  Temp: 98.8 F (37.1 C) 98.5 F (36.9 C)  SpO2: 97% 96%    Intake/Output Summary (Last 24 hours) at 05/05/2023 0958 Last data filed at 05/05/2023 0800 Gross per 24 hour  Intake 975.19 ml  Output --  Net 975.19 ml   Filed Weights   05/02/23 1530 05/03/23 0000  Weight: 83.9 kg 82.9 kg   Weight change:  Body mass index is 31.37 kg/m.   Physical Exam: General exam: Pleasant, elderly Caucasian female.  Not in distress. Skin: No rashes, lesions or ulcers. HEENT: Atraumatic, normocephalic, no obvious bleeding Lungs: Clear to auscultation bilaterally CVS: Regular rate and rhythm, no murmur GI/Abd soft, nontender, nondistended, bowels are present CNS: Alert, awake, oriented x 3. Psychiatry: Mood appropriate. Extremities: No pedal edema, no calf tenderness  Data Review: I  have personally reviewed the laboratory data and studies available.  F/u labs ordered Unresulted Labs (From admission, onward)     Start     Ordered   05/09/23 0500  Creatinine, serum  (enoxaparin (LOVENOX)    CrCl >/= 30 ml/min)  Weekly,   R     Comments: while on enoxaparin therapy    05/02/23 2254   05/03/23 0442  Urinalysis, w/ Reflex to Culture (Infection Suspected) -Urine, Clean Catch  (Urine Labs)  Once,   R       Question:  Specimen Source  Answer:  Urine, Clean Catch   05/03/23 0441            Total time spent in review of labs and imaging, patient evaluation, formulation of plan,  documentation and communication with family: 45 minutes  Signed, Lorin Glass, MD [facility] 05/05/2023

## 2023-05-05 NOTE — Progress Notes (Addendum)
Pt has DC order. Pt will be DC with HHPT per PT, casemanager was notified. [facility] will be the agancy for HHPT. AVS was given to pt by [facility] nurse. Friend will provide transportation.

## 2023-05-08 ENCOUNTER — Telehealth: Payer: Self-pay | Admitting: *Deleted

## 2023-05-08 NOTE — Telephone Encounter (Signed)
FYI patient want to let Dr. Carlis Abbott know she was hospitalized from 5/16-19.

## 2023-05-15 ENCOUNTER — Observation Stay (HOSPITAL_COMMUNITY)
Admission: EM | Admit: 2023-05-15 | Discharge: 2023-05-17 | Disposition: A | Discharge status: Home Health | Payer: 59 | Attending: Internal Medicine | Admitting: Internal Medicine

## 2023-05-15 ENCOUNTER — Emergency Department (HOSPITAL_COMMUNITY): Payer: 59

## 2023-05-15 ENCOUNTER — Encounter (HOSPITAL_COMMUNITY): Payer: Self-pay

## 2023-05-15 ENCOUNTER — Other Ambulatory Visit: Payer: Self-pay

## 2023-05-15 DIAGNOSIS — F0390 Unspecified dementia without behavioral disturbance: Secondary | ICD-10-CM | POA: Diagnosis not present

## 2023-05-15 DIAGNOSIS — F331 Major depressive disorder, recurrent, moderate: Secondary | ICD-10-CM | POA: Diagnosis present

## 2023-05-15 DIAGNOSIS — Y92009 Unspecified place in unspecified non-institutional (private) residence as the place of occurrence of the external cause: Secondary | ICD-10-CM | POA: Diagnosis not present

## 2023-05-15 DIAGNOSIS — I951 Orthostatic hypotension: Principal | ICD-10-CM | POA: Insufficient documentation

## 2023-05-15 DIAGNOSIS — N1832 Chronic kidney disease, stage 3b: Secondary | ICD-10-CM | POA: Insufficient documentation

## 2023-05-15 DIAGNOSIS — R0600 Dyspnea, unspecified: Secondary | ICD-10-CM | POA: Insufficient documentation

## 2023-05-15 DIAGNOSIS — W19XXXA Unspecified fall, initial encounter: Secondary | ICD-10-CM | POA: Insufficient documentation

## 2023-05-15 DIAGNOSIS — E785 Hyperlipidemia, unspecified: Secondary | ICD-10-CM | POA: Insufficient documentation

## 2023-05-15 DIAGNOSIS — Z79899 Other long term (current) drug therapy: Secondary | ICD-10-CM | POA: Diagnosis not present

## 2023-05-15 DIAGNOSIS — R296 Repeated falls: Secondary | ICD-10-CM

## 2023-05-15 DIAGNOSIS — R55 Syncope and collapse: Secondary | ICD-10-CM | POA: Diagnosis present

## 2023-05-15 DIAGNOSIS — Z602 Problems related to living alone: Secondary | ICD-10-CM

## 2023-05-15 DIAGNOSIS — N390 Urinary tract infection, site not specified: Secondary | ICD-10-CM | POA: Insufficient documentation

## 2023-05-15 DIAGNOSIS — R531 Weakness: Secondary | ICD-10-CM | POA: Diagnosis not present

## 2023-05-15 LAB — I-STAT CHEM 8, ED
BUN: 16 mg/dL (ref 8–23)
Calcium, Ion: 1.21 mmol/L (ref 1.15–1.40)
Chloride: 107 mmol/L (ref 98–111)
Creatinine, Ser: 1.2 mg/dL — ABNORMAL HIGH (ref 0.44–1.00)
Glucose, Bld: 135 mg/dL — ABNORMAL HIGH (ref 70–99)
HCT: 36 % (ref 36.0–46.0)
Hemoglobin: 12.2 g/dL (ref 12.0–15.0)
Potassium: 4.5 mmol/L (ref 3.5–5.1)
Sodium: 142 mmol/L (ref 135–145)
TCO2: 27 mmol/L (ref 22–32)

## 2023-05-15 LAB — COMPREHENSIVE METABOLIC PANEL
ALT: 25 U/L (ref 0–44)
AST: 34 U/L (ref 15–41)
Albumin: 3.7 g/dL (ref 3.5–5.0)
Alkaline Phosphatase: 68 U/L (ref 38–126)
Anion gap: 10 (ref 5–15)
BUN: 14 mg/dL (ref 8–23)
CO2: 24 mmol/L (ref 22–32)
Calcium: 9 mg/dL (ref 8.9–10.3)
Chloride: 104 mmol/L (ref 98–111)
Creatinine, Ser: 1.19 mg/dL — ABNORMAL HIGH (ref 0.44–1.00)
GFR, Estimated: 49 mL/min — ABNORMAL LOW (ref >60)
Glucose, Bld: 141 mg/dL — ABNORMAL HIGH (ref 70–99)
Potassium: 4.4 mmol/L (ref 3.5–5.1)
Sodium: 138 mmol/L (ref 135–145)
Total Bilirubin: 0.2 mg/dL — ABNORMAL LOW (ref 0.3–1.2)
Total Protein: 6.4 g/dL — ABNORMAL LOW (ref 6.5–8.1)

## 2023-05-15 LAB — CBC WITH DIFFERENTIAL/PLATELET
Abs Immature Granulocytes: 0.02 10*3/uL (ref 0.00–0.07)
Basophils Absolute: 0 10*3/uL (ref 0.0–0.1)
Basophils Relative: 1 %
Eosinophils Absolute: 0.1 10*3/uL (ref 0.0–0.5)
Eosinophils Relative: 3 %
HCT: 38.6 % (ref 36.0–46.0)
Hemoglobin: 12.3 g/dL (ref 12.0–15.0)
Immature Granulocytes: 1 %
Lymphocytes Relative: 33 %
Lymphs Abs: 1.3 10*3/uL (ref 0.7–4.0)
MCH: 31.9 pg (ref 26.0–34.0)
MCHC: 31.9 g/dL (ref 30.0–36.0)
MCV: 100.3 fL — ABNORMAL HIGH (ref 80.0–100.0)
Monocytes Absolute: 0.4 10*3/uL (ref 0.1–1.0)
Monocytes Relative: 10 %
Neutro Abs: 2.1 10*3/uL (ref 1.7–7.7)
Neutrophils Relative %: 52 %
Platelets: 134 10*3/uL — ABNORMAL LOW (ref 150–400)
RBC: 3.85 MIL/uL — ABNORMAL LOW (ref 3.87–5.11)
RDW: 13.2 % (ref 11.5–15.5)
WBC: 4 10*3/uL (ref 4.0–10.5)
nRBC: 0 % (ref 0.0–0.2)

## 2023-05-15 LAB — CBG MONITORING, ED: Glucose-Capillary: 137 mg/dL — ABNORMAL HIGH (ref 70–99)

## 2023-05-15 LAB — URINALYSIS, ROUTINE W REFLEX MICROSCOPIC
Bilirubin Urine: NEGATIVE
Glucose, UA: NEGATIVE mg/dL
Hgb urine dipstick: NEGATIVE
Ketones, ur: NEGATIVE mg/dL
Leukocytes,Ua: NEGATIVE
Nitrite: NEGATIVE
Protein, ur: NEGATIVE mg/dL
Specific Gravity, Urine: 1.027 (ref 1.005–1.030)
pH: 5 (ref 5.0–8.0)

## 2023-05-15 LAB — TROPONIN I (HIGH SENSITIVITY)
Troponin I (High Sensitivity): 2 ng/L (ref <18)
Troponin I (High Sensitivity): 3 ng/L (ref <18)

## 2023-05-15 LAB — D-DIMER, QUANTITATIVE: D-Dimer, Quant: 1 ug/mL-FEU — ABNORMAL HIGH (ref 0.00–0.50)

## 2023-05-15 MED ORDER — SODIUM CHLORIDE 0.9 % IV SOLN: INTRAVENOUS | Status: DC

## 2023-05-15 MED ORDER — ENOXAPARIN SODIUM 40 MG/0.4ML IJ SOSY: 40.0000 mg | PREFILLED_SYRINGE | INTRAMUSCULAR | Status: DC

## 2023-05-15 MED ORDER — SODIUM CHLORIDE 0.9% FLUSH: 3.0000 mL | INTRAVENOUS | Status: DC | PRN

## 2023-05-15 MED ORDER — ENOXAPARIN SODIUM 40 MG/0.4ML IJ SOSY
40.0000 mg | PREFILLED_SYRINGE | INTRAMUSCULAR | Status: DC
  Administered 2023-05-15 – 2023-05-16 (×2): 40 mg via SUBCUTANEOUS
  Filled 2023-05-15 (×2): qty 0.4

## 2023-05-15 MED ORDER — PREGABALIN 25 MG PO CAPS
50.0000 mg | ORAL_CAPSULE | Freq: Every day | ORAL | Status: DC
  Administered 2023-05-15 – 2023-05-16 (×2): 50 mg via ORAL
  Filled 2023-05-15 (×2): qty 2

## 2023-05-15 MED ORDER — DICLOFENAC SODIUM 1 % EX GEL: 2.0000 g | Freq: Every day | CUTANEOUS | Status: DC | PRN

## 2023-05-15 MED ORDER — MULTIVITAMIN PO TABS: ORAL_TABLET | Freq: Every day | ORAL | Status: DC

## 2023-05-15 MED ORDER — ROSUVASTATIN CALCIUM 5 MG PO TABS
10.0000 mg | ORAL_TABLET | Freq: Every day | ORAL | Status: DC
  Administered 2023-05-15 – 2023-05-17 (×3): 10 mg via ORAL
  Filled 2023-05-15 (×3): qty 2

## 2023-05-15 MED ORDER — SODIUM CHLORIDE 0.9 % IV SOLN: 250.0000 mL | INTRAVENOUS | Status: DC | PRN

## 2023-05-15 MED ORDER — CLONAZEPAM 0.25 MG PO TBDP
0.2500 mg | ORAL_TABLET | Freq: Every evening | ORAL | Status: DC | PRN
  Administered 2023-05-15 – 2023-05-17 (×2): 0.25 mg via ORAL
  Filled 2023-05-15 (×2): qty 1

## 2023-05-15 MED ORDER — MIDODRINE HCL 5 MG PO TABS
2.5000 mg | ORAL_TABLET | Freq: Three times a day (TID) | ORAL | Status: DC
  Administered 2023-05-16: 2.5 mg via ORAL
  Filled 2023-05-15: qty 1

## 2023-05-15 MED ORDER — ACETAMINOPHEN 500 MG PO TABS
1000.0000 mg | ORAL_TABLET | Freq: Four times a day (QID) | ORAL | Status: DC | PRN
  Administered 2023-05-15 – 2023-05-16 (×2): 1000 mg via ORAL
  Filled 2023-05-15 (×2): qty 2

## 2023-05-15 MED ORDER — POLYETHYLENE GLYCOL 3350 17 G PO PACK
17.0000 g | PACK | ORAL | Status: DC
  Administered 2023-05-16: 17 g via ORAL
  Filled 2023-05-15 (×2): qty 1

## 2023-05-15 MED ORDER — IOHEXOL 350 MG/ML SOLN
75.0000 mL | Freq: Once | INTRAVENOUS | Status: AC | PRN
  Administered 2023-05-15: 75 mL via INTRAVENOUS

## 2023-05-15 MED ORDER — QUETIAPINE FUMARATE 100 MG PO TABS
100.0000 mg | ORAL_TABLET | Freq: Every day | ORAL | Status: DC
  Administered 2023-05-15 – 2023-05-16 (×2): 100 mg via ORAL
  Filled 2023-05-15 (×2): qty 1

## 2023-05-15 MED ORDER — PANTOPRAZOLE SODIUM 40 MG PO TBEC
40.0000 mg | DELAYED_RELEASE_TABLET | Freq: Every day | ORAL | Status: DC
  Administered 2023-05-15 – 2023-05-17 (×3): 40 mg via ORAL
  Filled 2023-05-15 (×3): qty 1

## 2023-05-15 MED ORDER — ONDANSETRON 4 MG PO TBDP: 4.0000 mg | ORAL_TABLET | Freq: Three times a day (TID) | ORAL | Status: DC | PRN

## 2023-05-15 MED ORDER — SODIUM CHLORIDE 0.9 % IV BOLUS
1000.0000 mL | Freq: Once | INTRAVENOUS | Status: AC
  Administered 2023-05-15: 1000 mL via INTRAVENOUS

## 2023-05-15 MED ORDER — CALCIUM POLYCARBOPHIL 625 MG PO TABS
625.0000 mg | ORAL_TABLET | Freq: Every day | ORAL | Status: DC
  Administered 2023-05-15 – 2023-05-17 (×3): 625 mg via ORAL
  Filled 2023-05-15 (×4): qty 1

## 2023-05-15 MED ORDER — FLUTICASONE PROPIONATE 50 MCG/ACT NA SUSP
1.0000 | Freq: Every day | NASAL | Status: DC
  Administered 2023-05-16 – 2023-05-17 (×2): 1 via NASAL
  Filled 2023-05-15 (×2): qty 16

## 2023-05-15 MED ORDER — SODIUM CHLORIDE 0.9% FLUSH
3.0000 mL | Freq: Two times a day (BID) | INTRAVENOUS | Status: DC
  Administered 2023-05-16 – 2023-05-17 (×3): 3 mL via INTRAVENOUS

## 2023-05-15 NOTE — ED Notes (Signed)
ED TO INPATIENT HANDOFF REPORT  ED Nurse Name and Phone #: Tori [phone]  S Name/Age/Gender Kendra Simmons 72 y.o. female Room/Bed: 017C/017C  Code Status   Code Status: Full Code  Home/SNF/Other Home Patient oriented to: self, place, time, and situation Is this baseline? Yes   Triage Complete: Triage complete  Chief Complaint Orthostatic hypotension [I95.1]  Triage Note Pt BIB [facility] from home d/t orthostatic hypotension the last 2 weeks & falls frequently because of it. A/Ox4, last fell this morning when she stood off the toilet. Denies any injuries, did not hit her head, no LOC. Denies feeling dizzy just winded when she walks. Does have chronic Back pain & reports soreness from her falls. EMS reports her lying BP was 116/82 & standing BP of 88/50.     Allergies Allergies  Allergen Reactions   Phentermine Anxiety   Augmentin [Amoxicillin-Pot Clavulanate] Diarrhea    Level of Care/Admitting Diagnosis ED Disposition     ED Disposition  Admit   Condition  --   Comment  Hospital Area: [facility] [[id number]]  Level of Care: Telemetry Medical [104]  May place patient in observation at [facility] or [facility] if equivalent level of care is available:: No  Covid Evaluation: Asymptomatic - no recent exposure (last 10 days) testing not required  Diagnosis: Orthostatic hypotension [458.0.ICD-9-CM]  Admitting Physician: Joycelyn Das [[id number]]  Attending Physician: Joycelyn Das [[id number]]          B Medical/Surgery History Past Medical History:  Diagnosis Date   Anxiety    Arthritis    Depression    Emotional depression 02/04/2015   Frequent falls    GERD (gastroesophageal reflux disease)    High cholesterol    Insomnia    Memory loss    Transient alteration of awareness    Past Surgical History:  Procedure Laterality Date   BOTOX INJECTION N/A 10/17/2021   Procedure: BOTOX INJECTION;  Surgeon: Vida Rigger, MD;  Location: [facility]  ENDOSCOPY;  Service: Endoscopy;  Laterality: N/A;   CESAREAN SECTION     COSMETIC SURGERY     ESOPHAGEAL MANOMETRY N/A 09/26/2016   Procedure: ESOPHAGEAL MANOMETRY (EM);  Surgeon: Vida Rigger, MD;  Location: [facility] ENDOSCOPY;  Service: Endoscopy;  Laterality: N/A;   ESOPHAGOGASTRODUODENOSCOPY (EGD) WITH PROPOFOL N/A 10/17/2021   Procedure: ESOPHAGOGASTRODUODENOSCOPY (EGD) WITH PROPOFOL;  Surgeon: Vida Rigger, MD;  Location: [facility] ENDOSCOPY;  Service: Endoscopy;  Laterality: N/A;   laproscopy       A IV Location/Drains/Wounds Patient Lines/Drains/Airways Status     Active Line/Drains/Airways     Name Placement date Placement time Site Days   Peripheral IV 05/15/23 20 G Anterior;Left Forearm 05/15/23  0919  Forearm  less than 1            Intake/Output Last 24 hours No intake or output data in the 24 hours ending 05/15/23 1527  Labs/Imaging Results for orders placed or performed during the hospital encounter of 05/15/23 (from the past 48 hour(s))  CBC WITH DIFFERENTIAL     Status: Abnormal   Collection Time: 05/15/23  9:08 AM  Result Value Ref Range   WBC 4.0 4.0 - 10.5 K/uL   RBC 3.85 (L) 3.87 - 5.11 MIL/uL   Hemoglobin 12.3 12.0 - 15.0 g/dL   HCT 19.1 47.8 - 29.5 %   MCV 100.3 (H) 80.0 - 100.0 fL   MCH 31.9 26.0 - 34.0 pg   MCHC 31.9 30.0 - 36.0 g/dL   RDW 62.1 30.8 -  15.5 %   Platelets 134 (L) 150 - 400 K/uL   nRBC 0.0 0.0 - 0.2 %   Neutrophils Relative % 52 %   Neutro Abs 2.1 1.7 - 7.7 K/uL   Lymphocytes Relative 33 %   Lymphs Abs 1.3 0.7 - 4.0 K/uL   Monocytes Relative 10 %   Monocytes Absolute 0.4 0.1 - 1.0 K/uL   Eosinophils Relative 3 %   Eosinophils Absolute 0.1 0.0 - 0.5 K/uL   Basophils Relative 1 %   Basophils Absolute 0.0 0.0 - 0.1 K/uL   Immature Granulocytes 1 %   Abs Immature Granulocytes 0.02 0.00 - 0.07 K/uL    Comment: Performed at [facility], [address] [place], [place] [address]  Comprehensive metabolic panel     Status: Abnormal    Collection Time: 05/15/23  9:08 AM  Result Value Ref Range   Sodium 138 135 - 145 mmol/L   Potassium 4.4 3.5 - 5.1 mmol/L   Chloride 104 98 - 111 mmol/L   CO2 24 22 - 32 mmol/L   Glucose, Bld 141 (H) 70 - 99 mg/dL    Comment: Glucose reference range applies only to samples taken after fasting for at least 8 hours.   BUN 14 8 - 23 mg/dL   Creatinine, Ser 2.95 (H) 0.44 - 1.00 mg/dL   Calcium 9.0 8.9 - 62.1 mg/dL   Total Protein 6.4 (L) 6.5 - 8.1 g/dL   Albumin 3.7 3.5 - 5.0 g/dL   AST 34 15 - 41 U/L   ALT 25 0 - 44 U/L   Alkaline Phosphatase 68 38 - 126 U/L   Total Bilirubin 0.2 (L) 0.3 - 1.2 mg/dL   GFR, Estimated 49 (L) >60 mL/min    Comment: (NOTE) Calculated using the CKD-EPI Creatinine Equation (2021)    Anion gap 10 5 - 15    Comment: Performed at [facility], [address], [place], [place] [address]  Urinalysis, Routine w reflex microscopic -Urine, Clean Catch     Status: Abnormal   Collection Time: 05/15/23  9:08 AM  Result Value Ref Range   Color, Urine STRAW (A) YELLOW   APPearance CLEAR CLEAR   Specific Gravity, Urine 1.027 1.005 - 1.030   pH 5.0 5.0 - 8.0   Glucose, UA NEGATIVE NEGATIVE mg/dL   Hgb urine dipstick NEGATIVE NEGATIVE   Bilirubin Urine NEGATIVE NEGATIVE   Ketones, ur NEGATIVE NEGATIVE mg/dL   Protein, ur NEGATIVE NEGATIVE mg/dL   Nitrite NEGATIVE NEGATIVE   Leukocytes,Ua NEGATIVE NEGATIVE    Comment: Performed at [facility], [address], [place], [place] [address]  Troponin I (High Sensitivity)     Status: None   Collection Time: 05/15/23  9:08 AM  Result Value Ref Range   Troponin I (High Sensitivity) 2 <18 ng/L    Comment: (NOTE) Elevated high sensitivity troponin I (hsTnI) values and significant  changes across serial measurements may suggest ACS but many other  chronic and acute conditions are known to elevate hsTnI results.  Refer to the "Links" section for chest pain algorithms and additional  guidance. Performed at  [facility], [address] [place], [place] [address]   CBG monitoring, ED     Status: Abnormal   Collection Time: 05/15/23  9:11 AM  Result Value Ref Range   Glucose-Capillary 137 (H) 70 - 99 mg/dL    Comment: Glucose reference range applies only to samples taken after fasting for  at least 8 hours.   Comment 1 Notify RN    Comment 2 Document in Chart   I-Stat Chem 8, ED     Status: Abnormal   Collection Time: 05/15/23  9:15 AM  Result Value Ref Range   Sodium 142 135 - 145 mmol/L   Potassium 4.5 3.5 - 5.1 mmol/L   Chloride 107 98 - 111 mmol/L   BUN 16 8 - 23 mg/dL   Creatinine, Ser 1.61 (H) 0.44 - 1.00 mg/dL   Glucose, Bld 096 (H) 70 - 99 mg/dL    Comment: Glucose reference range applies only to samples taken after fasting for at least 8 hours.   Calcium, Ion 1.21 1.15 - 1.40 mmol/L   TCO2 27 22 - 32 mmol/L   Hemoglobin 12.2 12.0 - 15.0 g/dL   HCT 04.5 40.9 - 81.1 %  D-dimer, quantitative     Status: Abnormal   Collection Time: 05/15/23  9:37 AM  Result Value Ref Range   D-Dimer, Quant 1.00 (H) 0.00 - 0.50 ug/mL-FEU    Comment: (NOTE) At the manufacturer cut-off value of 0.5 g/mL FEU, this assay has a negative predictive value of 95-100%.This assay is intended for use in conjunction with a clinical pretest probability (PTP) assessment model to exclude pulmonary embolism (PE) and deep venous thrombosis (DVT) in outpatients suspected of PE or DVT. Results should be correlated with clinical presentation. Performed at [facility], [address] [place], [place] [address]   Troponin I (High Sensitivity)     Status: None   Collection Time: 05/15/23 11:15 AM  Result Value Ref Range   Troponin I (High Sensitivity) 3 <18 ng/L    Comment: (NOTE) Elevated high sensitivity troponin I (hsTnI) values and significant  changes across serial measurements may suggest ACS but many other  chronic and acute conditions are known to elevate hsTnI results.  Refer to the  "Links" section for chest pain algorithms and additional  guidance. Performed at [facility], [address], [place], [place] [address]    CT Angio Chest PE W and/or Wo Contrast  Result Date: 05/15/2023 CLINICAL DATA:  Pulmonary embolism (PE) suspected, low to intermediate prob, positive D-dimer. EXAM: CT ANGIOGRAPHY CHEST WITH CONTRAST TECHNIQUE: Multidetector CT imaging of the chest was performed using the standard protocol during bolus administration of intravenous contrast. Multiplanar CT image reconstructions and MIPs were obtained to evaluate the vascular anatomy. RADIATION DOSE REDUCTION: This exam was performed according to the departmental dose-optimization program which includes automated exposure control, adjustment of the mA and/or kV according to patient size and/or use of iterative reconstruction technique. CONTRAST:  75mL OMNIPAQUE IOHEXOL 350 MG/ML SOLN COMPARISON:  CT chest/abdomen/pelvis 10/18/2022. FINDINGS: Cardiovascular: Satisfactory opacification of the pulmonary arteries to the segmental level. No evidence of pulmonary embolism. Normal heart size. No pericardial effusion. Coronary atherosclerosis. Mediastinum/Nodes: No enlarged mediastinal, hilar, or axillary lymph nodes. Thyroid gland, trachea, and esophagus demonstrate no significant findings. Lungs/Pleura: Hypoventilatory changes in the lungs. No consolidation or pulmonary edema. No pleural effusion or pneumothorax. Upper Abdomen: Cholelithiasis without evidence of acute cholecystitis. Musculoskeletal: No chest wall abnormality. No acute or significant osseous findings. Review of the MIP images confirms the above findings. IMPRESSION: 1. No evidence of pulmonary embolism or other acute intrathoracic process. 2. Cholelithiasis without evidence of acute cholecystitis. 3. Coronary atherosclerosis. Electronically Signed   By: Orvan Falconer M.D.   On: 05/15/2023 12:57    Pending Labs Unresulted Labs (From admission, onward)  Start     Ordered   05/16/23 0500  Basic metabolic panel  Tomorrow morning,   R        05/15/23 1520   05/16/23 0500  CBC  Tomorrow morning,   R        05/15/23 1520   05/16/23 0500  Magnesium  Tomorrow morning,   R        05/15/23 1520   05/16/23 0500  Phosphorus  Tomorrow morning,   R        05/15/23 1520            Vitals/Pain Today's Vitals   05/15/23 1100 05/15/23 1300 05/15/23 1315 05/15/23 1400  BP: (!) 154/68  123/70 (!) 157/74  Pulse: (!) 58  68 60  Resp: 16  14 16   Temp:  (!) 97.4 F (36.3 C)    TempSrc:      SpO2: 100%  100% 99%  PainSc:        Isolation Precautions No active isolations  Medications Medications  sodium chloride 0.9 % bolus 1,000 mL (0 mLs Intravenous Stopped 05/15/23 1139)    And  0.9 %  sodium chloride infusion ( Intravenous New Bag/Given 05/15/23 0936)  acetaminophen (TYLENOL) tablet 1,000 mg (has no administration in time range)  rosuvastatin (CRESTOR) tablet 10 mg (has no administration in time range)  QUEtiapine (SEROQUEL) tablet 100 mg (has no administration in time range)  pantoprazole (PROTONIX) EC tablet 40 mg (has no administration in time range)  ondansetron (ZOFRAN-ODT) disintegrating tablet 4 mg (has no administration in time range)  polycarbophil (FIBERCON) tablet 625 mg (has no administration in time range)  polyethylene glycol (MIRALAX / GLYCOLAX) packet 17 g (has no administration in time range)  clonazepam (KLONOPIN) disintegrating tablet 0.25 mg (has no administration in time range)  pregabalin (LYRICA) capsule 50 mg (has no administration in time range)  Multivitamin TABS (has no administration in time range)  fluticasone (FLONASE) 50 MCG/ACT nasal spray 1 spray (has no administration in time range)  diclofenac Sodium (VOLTAREN) 1 % topical gel 2 g (has no administration in time range)  enoxaparin (LOVENOX) injection 40 mg (has no administration in time range)  sodium chloride flush (NS) 0.9 % injection 3 mL (has no  administration in time range)  sodium chloride flush (NS) 0.9 % injection 3 mL (has no administration in time range)  0.9 %  sodium chloride infusion (has no administration in time range)  midodrine (PROAMATINE) tablet 2.5 mg (has no administration in time range)  iohexol (OMNIPAQUE) 350 MG/ML injection 75 mL (75 mLs Intravenous Contrast Given 05/15/23 1235)    Mobility walks     Focused Assessments Pt positive for orthostatics    R Recommendations: See Admitting Provider Note  Report given to:   Additional Notes: axox4. VSS

## 2023-05-15 NOTE — ED Triage Notes (Signed)
Pt BIB [facility] from home d/t orthostatic hypotension the last 2 weeks & falls frequently because of it. A/Ox4, last fell this morning when she stood off the toilet. Denies any injuries, did not hit her head, no LOC. Denies feeling dizzy just winded when she walks. Does have chronic Back pain & reports soreness from her falls. EMS reports her lying BP was 116/82 & standing BP of 88/50.

## 2023-05-15 NOTE — H&P (Signed)
[facility] History and Physical  Kendra Simmons VWU:[id number] DOB: 05/18/1951 DOA: 05/15/2023  Referring physician: ED  PCP: Aliene Beams, MD   Patient is coming from: Home   Chief Complaint: Frequent falls,  HPI:  Kendra Simmons is a 72 y.o. female with PMH significant for obesity, HLD, mild dementia, anxiety/depression, arthritis, GERD presented to hospital with frequent falls, dizziness, lightheadedness on standing up.  Of note patient was recently discharged from the hospital on 05/05/2023 but then continues to have falls at home. PT was supposed to come at home but has not been yet.  Lives by herself at home and has animals to take care of.  In the ED, patient was extremely dizzy and lightheaded when trying to stand up and had significant orthostatic hypotension.  Neurological examination was unremarkable.  She complained of fatigue and shortness of breath on ambulation.  D-dimer was elevated so CTA chest was performed in the ED which did not show any evidence of pulmonary embolism or lobar pneumonia.  CBC showing hemoglobin of 12.3.  BMP with a creatinine of 1.1.  Urinalysis pending.  Patient was given 2 L of IV fluid bolus in the ED and was considered for admission to the hospital for orthostatic hypotension and frequent falls.  Might need rehabitation placement.  Assessment and plan.  Orthostatic hypotension,Frequent falls Generalized weakness Patient did have significant orthostatic hypotension in the ED with symptoms.  Received 2 L of IV fluid bolus in the ED.  Will continue with IV fluid hydration.  Patient is on multiple psychiatric medications that could be contributing to her presenting symptoms.  On last admission Flomax was changed to due to pain.  Consult physical therapy.  Fall precautions.  Will benefit from adding elastic compression stockings, orthostatic hypotension education, might benefit from midodrine at this time.  Will start with a low-dose of  midodrine for now since patient is reluctant to try to cut down her multiple medications which could be contributing to orthostatic hypotension.  Dementia  anxiety/depression On multiple medications including Namenda, amitriptyline, Wellbutrin, Lyrica, amitriptyline, Klonopin, Robaxin as outpatient.  On last admission extensive discussion was held regarding her medications and she did not wish to deescalate them and wanted to see her psychiatrist as outpatient.  On this admission I again had extensive discussion with her and we will try to cut down some of the medication doses.[name]   UTI Bladder spasms/urinary urgency Patient with urinary frequency, bladder spasms.  Switched back to Flomax  due to cost issues as per the patient.  CKD 3B Likely at baseline at this time.  Will continue to monitor BMP.  Hyperlipidemia Continue Crestor  Dyspnea on exertion.  Could be secondary to deconditioning.  Will check 2D echocardiogram.  CTA of the chest was negative for PE.   GERD Continue PPI.   DVT Prophylaxis: Lovenox  Review of Systems:  All systems were reviewed and were negative unless otherwise mentioned in the HPI   Past Medical History:  Diagnosis Date   Anxiety    Arthritis    Depression    Emotional depression 02/04/2015   Frequent falls    GERD (gastroesophageal reflux disease)    High cholesterol    Insomnia    Memory loss    Transient alteration of awareness    Past Surgical History:  Procedure Laterality Date   BOTOX INJECTION N/A 10/17/2021   Procedure: BOTOX INJECTION;  Surgeon: Vida Rigger, MD;  Location: [facility];  Service: Endoscopy;  Laterality: N/A;  CESAREAN SECTION     COSMETIC SURGERY     ESOPHAGEAL MANOMETRY N/A 09/26/2016   Procedure: ESOPHAGEAL MANOMETRY (EM);  Surgeon: Vida Rigger, MD;  Location: [facility];  Service: Endoscopy;  Laterality: N/A;   ESOPHAGOGASTRODUODENOSCOPY (EGD) WITH PROPOFOL N/A 10/17/2021   Procedure: ESOPHAGOGASTRODUODENOSCOPY  (EGD) WITH PROPOFOL;  Surgeon: Vida Rigger, MD;  Location: [facility];  Service: Endoscopy;  Laterality: N/A;   laproscopy      Social History:  reports that she has never smoked. She has never used smokeless tobacco. She reports that she does not drink alcohol and does not use drugs.  Allergies  Allergen Reactions   Phentermine Anxiety   Augmentin [Amoxicillin-Pot Clavulanate] Diarrhea    Family History  Problem Relation Age of Onset   Cancer Mother    Diabetes Mother    Sleep apnea Father    Alcohol abuse Father    Diabetes Sister    Diabetes Maternal Uncle    Diabetes Cousin      Prior to Admission medications   Medication Sig Start Date End Date Taking? Authorizing Provider  acetaminophen (TYLENOL) 500 MG tablet Take 2 tablets (1,000 mg total) by mouth every 6 (six) hours as needed. 10/30/22   Adam Phenix, PA-C  amitriptyline (ELAVIL) 150 MG tablet Take 1 tablet (150 mg total) by mouth at bedtime. 02/19/23   Raulkar, Drema Pry, MD  buPROPion (WELLBUTRIN XL) 150 MG 24 hr tablet TAKE 1 TABLET BY MOUTH EVERY DAY IN THE MORNING Patient taking differently: Take 150 mg by mouth daily. 11/05/22   Raulkar, Drema Pry, MD  Calcium Citrate-Vitamin D (CALCIUM CITRATE + D3 PO) Take 1 tablet by mouth daily.    [provider]  clonazePAM (KLONOPIN) 0.5 MG tablet Take 1 tablet (0.5 mg total) by mouth at bedtime as needed for anxiety. 03/14/23   Raulkar, Drema Pry, MD  diclofenac Sodium (VOLTAREN) 1 % GEL APPLY 2 GRAMS TO AFFECTED AREA 4 TIMES A DAY Patient taking differently: Apply 2 g topically daily as needed (for pain). 06/25/22   Kathryne Hitch, MD  fluticasone (FLONASE) 50 MCG/ACT nasal spray Place 1 spray into both nostrils daily. 05/07/22   [provider]  hyoscyamine (ANASPAZ) 0.125 MG TBDP disintergrating tablet Place 0.125 mg under the tongue daily as needed (Choking eposide per patient).    [provider]  memantine (NAMENDA) 10 MG tablet  Take 1 tablet (10 mg total) by mouth 2 (two) times daily. 06/02/15   Rankin, Shuvon B, NP  methocarbamol (ROBAXIN) 500 MG tablet Take 1 tablet (500 mg total) by mouth every 6 (six) hours as needed for muscle spasms. 12/18/22   Raulkar, Drema Pry, MD  Multiple Vitamins-Minerals (MULTIVITAMIN PO) Take 1 tablet by mouth daily. Centrum 50 +    [provider]  nystatin (MYCOSTATIN/NYSTOP) powder Apply 1 Application topically 2 (two) times daily as needed (for dry skin). 12/06/21   [provider]  omeprazole (PRILOSEC) 40 MG capsule Take 40 mg by mouth daily.  09/06/16   [provider]  ondansetron (ZOFRAN ODT) 4 MG disintegrating tablet Take 1 tablet (4 mg total) by mouth every 8 (eight) hours as needed for nausea or vomiting. 02/24/20   Joy, Shawn C, PA-C  oxybutynin 2.5 MG TABS Take 2.5 mg by mouth every 12 (twelve) hours as needed for bladder spasms. 05/05/23 06/04/23  Lorin Glass, MD  polycarbophil (FIBERCON) 625 MG tablet Take 625 mg by mouth daily. gummy    [provider]  polyethylene  glycol (MIRALAX / GLYCOLAX) 17 g packet Take 17 g by mouth every other day.    [provider]  pregabalin (LYRICA) 75 MG capsule Take 1 capsule (75 mg total) by mouth at bedtime. 03/12/23   Raulkar, Drema Pry, MD  QUEtiapine (SEROQUEL) 200 MG tablet Take 1 tablet (200 mg total) by mouth at bedtime. 12/18/22   Raulkar, Drema Pry, MD  QUEtiapine (SEROQUEL) 50 MG tablet Take 1 tablet (50 mg total) by mouth at bedtime. 12/18/22   Raulkar, Drema Pry, MD  rosuvastatin (CRESTOR) 10 MG tablet TAKE 1 TABLET BY MOUTH EVERY DAY 05/03/23   Horton Chin, MD    Physical Exam: Vitals:   05/15/23 1100 05/15/23 1300 05/15/23 1315 05/15/23 1400  BP: (!) 154/68  123/70 (!) 157/74  Pulse: (!) 58  68 60  Resp: 16  14 16   Temp:  (!) 97.4 F (36.3 C)    TempSrc:      SpO2: 100%  100% 99%   Wt Readings from Last 3 Encounters:  05/03/23 82.9 kg  02/19/23 83.6 kg  01/04/23 81.6 kg    There is no height or weight on file to calculate BMI.  General: Obese built, not in obvious distress HENT: Normocephalic, No scleral pallor or icterus noted. Oral mucosa is moist.  Chest:  Clear breath sounds.  . No crackles or wheezes.  CVS: S1 &S2 heard. No murmur.  Regular rate and rhythm. Abdomen: Soft, nontender, nondistended.  Bowel sounds are heard. No abdominal mass palpated Extremities: No cyanosis, clubbing or edema.  Peripheral pulses are palpable. Psych: Alert, awake and oriented, normal mood CNS:  No cranial nerve deficits.  Power equal in all extremities.  Gait could not be tested. Skin: Warm and dry.  No rashes noted.  Labs on Admission:   CBC: Recent Labs  Lab 05/15/23 0908 05/15/23 0915  WBC 4.0  --   NEUTROABS 2.1  --   HGB 12.3 12.2  HCT 38.6 36.0  MCV 100.3*  --   PLT 134*  --     Basic Metabolic Panel: Recent Labs  Lab 05/15/23 0908 05/15/23 0915  NA 138 142  K 4.4 4.5  CL 104 107  CO2 24  --   GLUCOSE 141* 135*  BUN 14 16  CREATININE 1.19* 1.20*  CALCIUM 9.0  --     Liver Function Tests: Recent Labs  Lab 05/15/23 0908  AST 34  ALT 25  ALKPHOS 68  BILITOT 0.2*  PROT 6.4*  ALBUMIN 3.7   No results for input(s): "LIPASE", "AMYLASE" in the last 168 hours. No results for input(s): "AMMONIA" in the last 168 hours.  Cardiac Enzymes: No results for input(s): "CKTOTAL", "CKMB", "CKMBINDEX", "TROPONINI" in the last 168 hours.  BNP (last 3 results) No results for input(s): "BNP" in the last 8760 hours.  ProBNP (last 3 results) No results for input(s): "PROBNP" in the last 8760 hours.  CBG: Recent Labs  Lab 05/15/23 0911  GLUCAP 137*    Lipase     Component Value Date/Time   LIPASE 23 10/15/2021 1534     Urinalysis    Component Value Date/Time   COLORURINE STRAW (A) 05/15/2023 0908   APPEARANCEUR CLEAR 05/15/2023 0908   LABSPEC 1.027 05/15/2023 0908   PHURINE 5.0 05/15/2023 0908   GLUCOSEU NEGATIVE 05/15/2023 0908    HGBUR NEGATIVE 05/15/2023 0908   BILIRUBINUR NEGATIVE 05/15/2023 0908   KETONESUR NEGATIVE 05/15/2023 0908   PROTEINUR NEGATIVE 05/15/2023 0908   UROBILINOGEN 0.2 07/03/2015  1420   NITRITE NEGATIVE 05/15/2023 0908   LEUKOCYTESUR NEGATIVE 05/15/2023 0908     Drugs of Abuse     Component Value Date/Time   LABOPIA NONE DETECTED 05/23/2015 1657   COCAINSCRNUR NONE DETECTED 05/23/2015 1657   LABBENZ NONE DETECTED 05/23/2015 1657   AMPHETMU NONE DETECTED 05/23/2015 1657   THCU NONE DETECTED 05/23/2015 1657   LABBARB NONE DETECTED 05/23/2015 1657      Radiological Exams on Admission: CT Angio Chest PE W and/or Wo Contrast  Result Date: 05/15/2023 CLINICAL DATA:  Pulmonary embolism (PE) suspected, low to intermediate prob, positive D-dimer. EXAM: CT ANGIOGRAPHY CHEST WITH CONTRAST TECHNIQUE: Multidetector CT imaging of the chest was performed using the standard protocol during bolus administration of intravenous contrast. Multiplanar CT image reconstructions and MIPs were obtained to evaluate the vascular anatomy. RADIATION DOSE REDUCTION: This exam was performed according to the departmental dose-optimization program which includes automated exposure control, adjustment of the mA and/or kV according to patient size and/or use of iterative reconstruction technique. CONTRAST:  75mL OMNIPAQUE IOHEXOL 350 MG/ML SOLN COMPARISON:  CT chest/abdomen/pelvis 10/18/2022. FINDINGS: Cardiovascular: Satisfactory opacification of the pulmonary arteries to the segmental level. No evidence of pulmonary embolism. Normal heart size. No pericardial effusion. Coronary atherosclerosis. Mediastinum/Nodes: No enlarged mediastinal, hilar, or axillary lymph nodes. Thyroid gland, trachea, and esophagus demonstrate no significant findings. Lungs/Pleura: Hypoventilatory changes in the lungs. No consolidation or pulmonary edema. No pleural effusion or pneumothorax. Upper Abdomen: Cholelithiasis without evidence of acute  cholecystitis. Musculoskeletal: No chest wall abnormality. No acute or significant osseous findings. Review of the MIP images confirms the above findings. IMPRESSION: 1. No evidence of pulmonary embolism or other acute intrathoracic process. 2. Cholelithiasis without evidence of acute cholecystitis. 3. Coronary atherosclerosis. Electronically Signed   By: Orvan Falconer M.D.   On: 05/15/2023 12:57    EKG: Personally reviewed by me which shows normal sinus rhythm   Consultant: None  Code Status: Full code  Microbiology none  Antibiotics: None  Family Communication:  Patients' condition and plan of care including tests being ordered have been discussed with the patient  who indicate understanding and agree with the plan.   Status is: Observation The patient remains OBS appropriate and will d/c before 2 midnights.   Severity of Illness: The appropriate patient status for this patient is OBSERVATION. Observation status is judged to be reasonable and necessary in order to provide the required intensity of service to ensure the patient's safety. The patient's presenting symptoms, physical exam findings, and initial radiographic and laboratory data in the context of their medical condition is felt to place them at decreased risk for further clinical deterioration. Furthermore, it is anticipated that the patient will be medically stable for discharge from the hospital within 2 midnights of admission.   Signed, Joycelyn Das, MD [facility] 05/15/2023

## 2023-05-15 NOTE — Hospital Course (Addendum)
Kendra Simmons is a 72 y.o. female with PMH significant for obesity, HLD, mild dementia, anxiety/depression, arthritis, GERD presented to hospital with frequent falls dizziness lightheadedness on standing up.  Of note patient was recently discharged from the hospital on 05/05/2023 but then continues to have falls at home.  She was supposed to come at home but has not been yet.  Lives by herself at home.  In the ED. patient was extremely dizzy and lightheaded when trying to stand up and had significant orthostatic hypotension.  Neurological examination was unremarkable.  She complained of fatigue and shortness of breath on ambulation.  D-dimer was elevated so CTA chest was performed in the ED which did not show any evidence of pulmonary embolism or lobar pneumonia.  CBC showing hemoglobin of 12.3.  BMP with a creatinine of 1.1.  Urinalysis pending.  Patient was given 2 L of IV fluid bolus in the ED and was considered for admission to the hospital for orthostatic hypotension and frequent falls.  Might need rehabitation placement.  Assessment and plan.  Orthostatic hypotension,Frequent falls Generalized weakness Patient did have significant orthostatic hypotension in the ED with symptoms.  Received 2 L of IV fluid bolus in the ED.  Will continue with IV fluid hydration.  Patient is on multiple psychiatric medications that could be contributing to her presenting symptoms.  On last admission Flomax was changed to due to pain.  Consult physical therapy.  Fall precautions.  Will benefit from adding sequential compression devices, orthostatic hypotension education, might benefit from midodrine at this time.  Dementia  anxiety/depression On multiple medications including Namenda, amitriptyline, Wellbutrin, Lyrica, amitriptyline, Klonopin, Robaxin as outpatient.  On last admission extensive discussion was held regarding her medications and she did not wish to deescalate them and wanted to see her  psychiatrist as outpatient.[name]   UTI Bladder spasms/urinary urgency Patient with urinary frequency, bladder spasms.  Switched from Flomax to Ditropan on last admission...   CKD 3B Likely at baseline at this time.  Will continue to monitor BMP.  Hyperlipidemia Continue Crestor  Dyspnea on exertion.  Could be secondary to deconditioning.  Will check 2D echocardiogram.  CTA of the chest was negative for PE.   GERD Continue PPI.

## 2023-05-15 NOTE — ED Provider Notes (Signed)
[place] EMERGENCY DEPARTMENT AT [facility] Provider Note   CSN: [id number] Arrival date & time: 05/15/23  0102     History  Chief Complaint  Patient presents with   Orthostatic Hypotension    Fall    Kendra Simmons is a 72 y.o. femalePMH significant for obesity, HLD, mild dementia, anxiety/depression, arthritis, GERD.  Patient was recently admitted with near syncope and orthostatic hypotension.  She has had multiple falls at home and lives alone.  Patient had CT head and C-spine that were unremarkable at last visit.  He had mildly elevated creatinine consistent with AKI.  She is discharged home with home PT.  She reports that she has had persistent if not worsening exertional dyspnea without orthopnea or PND.  She denies any new swelling in her lower extremities.  At discharge the physician felt that her multiple psychiatric medications were likely contributing to her orthostatic hypotension however patient was very reluctant to cut back and instead switched from taking Flomax to Ditropan.  Patient denies chest pain.  EMS reports an approximate 30 point from sitting to standing   Fall       Home Medications Prior to Admission medications   Medication Sig Start Date End Date Taking? Authorizing Provider  acetaminophen (TYLENOL) 500 MG tablet Take 2 tablets (1,000 mg total) by mouth every 6 (six) hours as needed. 10/30/22   Adam Phenix, PA-C  amitriptyline (ELAVIL) 150 MG tablet Take 1 tablet (150 mg total) by mouth at bedtime. 02/19/23   Raulkar, Drema Pry, MD  buPROPion (WELLBUTRIN XL) 150 MG 24 hr tablet TAKE 1 TABLET BY MOUTH EVERY DAY IN THE MORNING Patient taking differently: Take 150 mg by mouth daily. 11/05/22   Raulkar, Drema Pry, MD  Calcium Citrate-Vitamin D (CALCIUM CITRATE + D3 PO) Take 1 tablet by mouth daily.    [provider]  clonazePAM (KLONOPIN) 0.5 MG tablet Take 1 tablet (0.5 mg total) by mouth at bedtime as needed for  anxiety. 03/14/23   Raulkar, Drema Pry, MD  diclofenac Sodium (VOLTAREN) 1 % GEL APPLY 2 GRAMS TO AFFECTED AREA 4 TIMES A DAY Patient taking differently: Apply 2 g topically daily as needed (for pain). 06/25/22   Kathryne Hitch, MD  fluticasone (FLONASE) 50 MCG/ACT nasal spray Place 1 spray into both nostrils daily. 05/07/22   [provider]  hyoscyamine (ANASPAZ) 0.125 MG TBDP disintergrating tablet Place 0.125 mg under the tongue daily as needed (Choking eposide per patient).    [provider]  memantine (NAMENDA) 10 MG tablet Take 1 tablet (10 mg total) by mouth 2 (two) times daily. 06/02/15   Rankin, Shuvon B, NP  methocarbamol (ROBAXIN) 500 MG tablet Take 1 tablet (500 mg total) by mouth every 6 (six) hours as needed for muscle spasms. 12/18/22   Raulkar, Drema Pry, MD  Multiple Vitamins-Minerals (MULTIVITAMIN PO) Take 1 tablet by mouth daily. Centrum 50 +    [provider]  nystatin (MYCOSTATIN/NYSTOP) powder Apply 1 Application topically 2 (two) times daily as needed (for dry skin). 12/06/21   [provider]  omeprazole (PRILOSEC) 40 MG capsule Take 40 mg by mouth daily.  09/06/16   [provider]  ondansetron (ZOFRAN ODT) 4 MG disintegrating tablet Take 1 tablet (4 mg total) by mouth every 8 (eight) hours as needed for nausea or vomiting. 02/24/20   Joy, Shawn C, PA-C  oxybutynin 2.5 MG TABS Take 2.5 mg by mouth every 12 (twelve) hours as needed for  bladder spasms. 05/05/23 06/04/23  Lorin Glass, MD  polycarbophil (FIBERCON) 625 MG tablet Take 625 mg by mouth daily. gummy    [provider]  polyethylene glycol (MIRALAX / GLYCOLAX) 17 g packet Take 17 g by mouth every other day.    [provider]  pregabalin (LYRICA) 75 MG capsule Take 1 capsule (75 mg total) by mouth at bedtime. 03/12/23   Raulkar, Drema Pry, MD  QUEtiapine (SEROQUEL) 200 MG tablet Take 1 tablet (200 mg total) by mouth at bedtime. 12/18/22   Raulkar, Drema Pry, MD  QUEtiapine (SEROQUEL) 50 MG tablet Take 1 tablet (50 mg total) by mouth at bedtime. 12/18/22   Raulkar, Drema Pry, MD  rosuvastatin (CRESTOR) 10 MG tablet TAKE 1 TABLET BY MOUTH EVERY DAY 05/03/23   Raulkar, Drema Pry, MD      Allergies    Phentermine and Augmentin [amoxicillin-pot clavulanate]    Review of Systems   Review of Systems  Physical Exam Updated Vital Signs BP 134/66 (BP Location: Right Arm)   Pulse 66   Temp (!) 97.4 F (36.3 C) (Oral)   Resp 18   SpO2 97%  Physical Exam Vitals and nursing note reviewed.  Constitutional:      General: She is not in acute distress.    Appearance: She is well-developed. She is not diaphoretic.  HENT:     Head: Normocephalic and atraumatic.     Right Ear: External ear normal.     Left Ear: External ear normal.     Nose: Nose normal.     Mouth/Throat:     Mouth: Mucous membranes are moist.  Eyes:     General: No scleral icterus.    Conjunctiva/sclera: Conjunctivae normal.  Cardiovascular:     Rate and Rhythm: Normal rate and regular rhythm.     Heart sounds: Normal heart sounds. No murmur heard.    No friction rub. No gallop.  Pulmonary:     Effort: Pulmonary effort is normal. No respiratory distress.     Breath sounds: Normal breath sounds.  Abdominal:     General: Bowel sounds are normal. There is no distension.     Palpations: Abdomen is soft. There is no mass.     Tenderness: There is no abdominal tenderness. There is no guarding.  Musculoskeletal:     Cervical back: Normal range of motion.     Right lower leg: No edema.     Left lower leg: No edema.  Skin:    General: Skin is warm and dry.  Neurological:     Mental Status: She is alert and oriented to person, place, and time.  Psychiatric:        Behavior: Behavior normal.     ED Results / Procedures / Treatments   Labs (all labs ordered are listed, but only abnormal results are displayed) Labs Reviewed  CBC WITH DIFFERENTIAL/PLATELET  COMPREHENSIVE  METABOLIC PANEL  URINALYSIS, ROUTINE W REFLEX MICROSCOPIC  CBG MONITORING, ED  I-STAT CHEM 8, ED  TROPONIN I (HIGH SENSITIVITY)    EKG EKG Interpretation  Date/Time:  Wednesday May 15 2023 09:07:35 EDT Ventricular Rate:  65 PR Interval:  175 QRS Duration: 106 QT Interval:  418 QTC Calculation: 435 R Axis:   -15 Text Interpretation: Sinus rhythm Abnormal R-wave progression, early transition Left ventricular hypertrophy no significant change since Sept 2023 Confirmed by Pricilla Loveless [phone]) on 05/15/2023 9:08:56 AM  Radiology No results found.  Procedures Procedures    Medications Ordered in ED  Medications  sodium chloride 0.9 % bolus 1,000 mL (has no administration in time range)    And  0.9 %  sodium chloride infusion (has no administration in time range)    ED Course/ Medical Decision Making/ A&P Clinical Course as of 05/15/23 1601  Wed May 15, 2023  1102 Troponin I (High Sensitivity) [AH]  1102 D-dimer, quantitative(!) [AH]  1102 Creatinine(!): 1.19 [AH]  1102 CBC WITH DIFFERENTIAL(!) [AH]    Clinical Course User Index [AH] Arthor Captain, PA-C                             Medical Decision Making Amount and/or Complexity of Data Reviewed Labs: ordered. Decision-making details documented in ED Course. Radiology: ordered. ECG/medicine tests: ordered.  Risk Prescription drug management. Decision regarding hospitalization.   72 year old female who presents emergency department with recurrent falls, known orthostatic hypotension.  Differential diagnosis includes acute kidney injury, pulmonary embolus, CHF, dysautonomia, GI bleed.  I ordered labs.  There were no significant or pertinent findings except an elevated D-dimer.  I ordered and interpreted CT angiogram of the chest which showed no evidence of PE or other acute finding.  EKG reassuring and shows sinus rhythm at a rate of 65.   Social determinants of health include but the patient lives alone and  does not have outside help.   After review of all data points patient continues to have significant orthostatic hypotension is very dizzy when she stands.  Given her multiple falls, age and current social determinants of health she will need readmission for further evaluation.  Patient is in agreement.  Discussed with Dr. Tyson Babinski who will admit the patient.321        Final Clinical Impression(s) / ED Diagnoses Final diagnoses:  None    Rx / DC Orders ED Discharge Orders     None         Arthor Captain, PA-C 05/15/23 1604    Pricilla Loveless, MD 05/17/23 [phone]

## 2023-05-16 ENCOUNTER — Observation Stay (HOSPITAL_BASED_OUTPATIENT_CLINIC_OR_DEPARTMENT_OTHER): Payer: 59

## 2023-05-16 DIAGNOSIS — I951 Orthostatic hypotension: Secondary | ICD-10-CM | POA: Diagnosis not present

## 2023-05-16 DIAGNOSIS — R55 Syncope and collapse: Secondary | ICD-10-CM | POA: Diagnosis not present

## 2023-05-16 DIAGNOSIS — R296 Repeated falls: Secondary | ICD-10-CM | POA: Diagnosis not present

## 2023-05-16 DIAGNOSIS — F331 Major depressive disorder, recurrent, moderate: Secondary | ICD-10-CM | POA: Diagnosis not present

## 2023-05-16 DIAGNOSIS — R531 Weakness: Secondary | ICD-10-CM | POA: Diagnosis not present

## 2023-05-16 LAB — ECHOCARDIOGRAM COMPLETE
AR max vel: 1.87 cm2
AV Area VTI: 1.72 cm2
AV Area mean vel: 1.71 cm2
AV Mean grad: 7 mmHg
AV Peak grad: 11 mmHg
Ao pk vel: 1.66 m/s
Area-P 1/2: 3.12 cm2
Height: 64.016 in
S' Lateral: 1.9 cm
Weight: 2924.18 oz

## 2023-05-16 LAB — BASIC METABOLIC PANEL
Anion gap: 7 (ref 5–15)
BUN: 10 mg/dL (ref 8–23)
CO2: 22 mmol/L (ref 22–32)
Calcium: 8.2 mg/dL — ABNORMAL LOW (ref 8.9–10.3)
Chloride: 111 mmol/L (ref 98–111)
Creatinine, Ser: 0.93 mg/dL (ref 0.44–1.00)
GFR, Estimated: >60 mL/min (ref >60)
Glucose, Bld: 97 mg/dL (ref 70–99)
Potassium: 4.1 mmol/L (ref 3.5–5.1)
Sodium: 140 mmol/L (ref 135–145)

## 2023-05-16 LAB — CBC
HCT: 32.1 % — ABNORMAL LOW (ref 36.0–46.0)
Hemoglobin: 10.1 g/dL — ABNORMAL LOW (ref 12.0–15.0)
MCH: 31.3 pg (ref 26.0–34.0)
MCHC: 31.5 g/dL (ref 30.0–36.0)
MCV: 99.4 fL (ref 80.0–100.0)
Platelets: 115 10*3/uL — ABNORMAL LOW (ref 150–400)
RBC: 3.23 MIL/uL — ABNORMAL LOW (ref 3.87–5.11)
RDW: 13.1 % (ref 11.5–15.5)
WBC: 4.3 10*3/uL (ref 4.0–10.5)
nRBC: 0 % (ref 0.0–0.2)

## 2023-05-16 LAB — PHOSPHORUS: Phosphorus: 4.1 mg/dL (ref 2.5–4.6)

## 2023-05-16 LAB — MAGNESIUM: Magnesium: 1.9 mg/dL (ref 1.7–2.4)

## 2023-05-16 LAB — VITAMIN B12: Vitamin B-12: 259 pg/mL (ref 180–914)

## 2023-05-16 MED ORDER — TAMSULOSIN HCL 0.4 MG PO CAPS
0.4000 mg | ORAL_CAPSULE | Freq: Every day | ORAL | Status: DC
  Administered 2023-05-16 – 2023-05-17 (×2): 0.4 mg via ORAL
  Filled 2023-05-16 (×2): qty 1

## 2023-05-16 MED ORDER — MIDODRINE HCL 5 MG PO TABS
5.0000 mg | ORAL_TABLET | Freq: Three times a day (TID) | ORAL | Status: DC
  Administered 2023-05-16 – 2023-05-17 (×4): 5 mg via ORAL
  Filled 2023-05-16 (×4): qty 1

## 2023-05-16 MED ORDER — METHOCARBAMOL 500 MG PO TABS
500.0000 mg | ORAL_TABLET | Freq: Four times a day (QID) | ORAL | Status: DC | PRN
  Administered 2023-05-16 – 2023-05-17 (×2): 500 mg via ORAL
  Filled 2023-05-16 (×2): qty 1

## 2023-05-16 MED ORDER — BUPROPION HCL ER (XL) 150 MG PO TB24
150.0000 mg | ORAL_TABLET | Freq: Every day | ORAL | Status: DC
  Administered 2023-05-16 – 2023-05-17 (×2): 150 mg via ORAL
  Filled 2023-05-16 (×2): qty 1

## 2023-05-16 MED ORDER — RISAQUAD PO CAPS
1.0000 | ORAL_CAPSULE | Freq: Every day | ORAL | Status: DC
  Administered 2023-05-16 – 2023-05-17 (×2): 1 via ORAL
  Filled 2023-05-16 (×2): qty 1

## 2023-05-16 MED ORDER — NYSTATIN 100000 UNIT/GM EX POWD: 1.0000 | Freq: Two times a day (BID) | CUTANEOUS | Status: DC | PRN

## 2023-05-16 MED ORDER — MEMANTINE HCL 10 MG PO TABS
10.0000 mg | ORAL_TABLET | Freq: Two times a day (BID) | ORAL | Status: DC
  Administered 2023-05-16 – 2023-05-17 (×3): 10 mg via ORAL
  Filled 2023-05-16 (×3): qty 1

## 2023-05-16 NOTE — Progress Notes (Signed)
PROGRESS NOTE    Kendra Simmons  ZOX:[id number] DOB: [birth year] DOA: 05/15/2023 PCP: Aliene Beams, MD    Brief Narrative:  Kendra Simmons is a 72 y.o. female with PMH significant for obesity, HLD, mild dementia, anxiety/depression, arthritis, GERD presented to hospital with frequent falls dizziness lightheadedness on standing up.  Of note patient was recently discharged from the hospital on 05/05/2023 but then continues to have falls at home.  Lives by herself at home.  In the ED, patient was extremely dizzy and lightheaded when trying to stand up and had significant orthostatic hypotension.  Neurological examination was unremarkable.   D-dimer was elevated so CTA chest was performed in the ED which did not show any evidence of pulmonary embolism or lobar pneumonia.  CBC showing hemoglobin of 12.3.  BMP with a creatinine of 1.1.   Patient was given 2 L of IV fluid bolus in the ED and was considered for admission to the hospital for orthostatic hypotension and frequent falls.  Might need rehabitation placement.  Assessment and plan.  Orthostatic hypotension,Frequent falls Generalized weakness Patient did have significant orthostatic hypotension in the ED with symptoms.  Still orthostatic with dizziness.  Received 2 L of IV fluid bolus in the ED.  Will continue with IV fluid hydration, currently on 125 mill per hour of normal saline..  Patient is on multiple psychiatric medications that could be contributing to her presenting symptoms.  Continue fall precautions.  PT OT evaluation.  Discussed about orthostatic precautions, hydration.  Elastic compression stockings have been ordered pending placement.  Low-dose midodrine has been initiated since yesterday.  Will increase the dose to 5 mg 3 times daily.  Vitamin B12 at 259.  Vitamin D pending.  Dementia  anxiety/depression On multiple medications including Namenda, amitriptyline, Wellbutrin, Lyrica, amitriptyline, Klonopin, Robaxin  as outpatient.  On last admission extensive discussion was held regarding her medications and she did not wish to deescalate them and wanted to see her psychiatrist as outpatient.  At this time few medications have been slightly decreased in the doses.  Hold off with Robaxin.   UTI Bladder spasms/urinary urgency Patient with urinary frequency, bladder spasms.  Switched from Flomax to Ditropan on last admission.  Wishes to go back to Flomax on this admission.  CKD 3B Likely at baseline at this time.  Will continue to monitor BMP.  Latest creatinine of 0.9.  Hyperlipidemia Continue Crestor  Dyspnea on exertion.  Could be secondary to deconditioning.  Will check 2D echocardiogram pending..  CTA of the chest was negative for PE.   GERD Continue PPI.     DVT prophylaxis: enoxaparin (LOVENOX) injection 40 mg Start: 05/15/23 2200   Code Status:     Code Status: Full Code  Disposition: Uncertain at this time.  Likely Home with home health likely in 1 to 2 days, PT OT pending.  Status is: Observation  The patient will require care spanning > 2 midnights and should be moved to inpatient because: Persistent orthostatic hypotension, IV fluids, need for PT OT evaluation.   Family Communication: None at bedside  Consultants:  None  Procedures:  None  Antimicrobials:  None  Anti-infectives (From admission, onward)    None      Subjective: Today, patient was seen and examined at bedside.  Patient denies any nausea, vomiting, fever, chills or rigor but has left hip discomfort from recent fall.  Patient had orthostatic hypotension with dizziness  Objective: Vitals:   05/16/23 8119 05/16/23 0824 05/16/23 0827 05/16/23  0834  BP: (!) 132/52 130/69 97/68 138/79  Pulse: 88 86 98 95  Resp: 18 18 18    Temp:      TempSrc:      SpO2: 100% 99% 100% 100%  Weight:      Height:        Intake/Output Summary (Last 24 hours) at 05/16/2023 1134 Last data filed at 05/16/2023 0327 Gross per  24 hour  Intake 1434.93 ml  Output --  Net 1434.93 ml   Filed Weights   05/15/23 1624  Weight: 82.9 kg    Physical Examination: Body mass index is 31.36 kg/m.   General: Obese built, not in obvious distress HENT:   No scleral pallor or icterus noted. Oral mucosa is moist.  Chest:  Clear breath sounds.   No crackles or wheezes.  CVS: S1 &S2 heard. No murmur.  Regular rate and rhythm. Abdomen: Soft, nontender, nondistended.  Bowel sounds are heard.   Extremities: No cyanosis, clubbing or edema.  Peripheral pulses are palpable. Psych: Alert, awake and oriented, normal mood CNS:  No cranial nerve deficits.  Power equal in all extremities.   Skin: Warm and dry.  No rashes noted.  Data Reviewed:   CBC: Recent Labs  Lab 05/15/23 0908 05/15/23 0915 05/16/23 0341  WBC 4.0  --  4.3  NEUTROABS 2.1  --   --   HGB 12.3 12.2 10.1*  HCT 38.6 36.0 32.1*  MCV 100.3*  --  99.4  PLT 134*  --  115*    Basic Metabolic Panel: Recent Labs  Lab 05/15/23 0908 05/15/23 0915 05/16/23 0341  NA 138 142 140  K 4.4 4.5 4.1  CL 104 107 111  CO2 24  --  22  GLUCOSE 141* 135* 97  BUN 14 16 10   CREATININE 1.19* 1.20* 0.93  CALCIUM 9.0  --  8.2*  MG  --   --  1.9  PHOS  --   --  4.1    Liver Function Tests: Recent Labs  Lab 05/15/23 0908  AST 34  ALT 25  ALKPHOS 68  BILITOT 0.2*  PROT 6.4*  ALBUMIN 3.7     Radiology Studies: CT Angio Chest PE W and/or Wo Contrast  Result Date: 05/15/2023 CLINICAL DATA:  Pulmonary embolism (PE) suspected, low to intermediate prob, positive D-dimer. EXAM: CT ANGIOGRAPHY CHEST WITH CONTRAST TECHNIQUE: Multidetector CT imaging of the chest was performed using the standard protocol during bolus administration of intravenous contrast. Multiplanar CT image reconstructions and MIPs were obtained to evaluate the vascular anatomy. RADIATION DOSE REDUCTION: This exam was performed according to the departmental dose-optimization program which includes  automated exposure control, adjustment of the mA and/or kV according to patient size and/or use of iterative reconstruction technique. CONTRAST:  75mL OMNIPAQUE IOHEXOL 350 MG/ML SOLN COMPARISON:  CT chest/abdomen/pelvis 10/18/2022. FINDINGS: Cardiovascular: Satisfactory opacification of the pulmonary arteries to the segmental level. No evidence of pulmonary embolism. Normal heart size. No pericardial effusion. Coronary atherosclerosis. Mediastinum/Nodes: No enlarged mediastinal, hilar, or axillary lymph nodes. Thyroid gland, trachea, and esophagus demonstrate no significant findings. Lungs/Pleura: Hypoventilatory changes in the lungs. No consolidation or pulmonary edema. No pleural effusion or pneumothorax. Upper Abdomen: Cholelithiasis without evidence of acute cholecystitis. Musculoskeletal: No chest wall abnormality. No acute or significant osseous findings. Review of the MIP images confirms the above findings. IMPRESSION: 1. No evidence of pulmonary embolism or other acute intrathoracic process. 2. Cholelithiasis without evidence of acute cholecystitis. 3. Coronary atherosclerosis. Electronically Signed   By: Zollie Beckers  Wiggins M.D.   On: 05/15/2023 12:57      LOS: 0 days    Joycelyn Das, MD [facility] Available via Epic secure chat 7am-7pm After these hours, please refer to coverage provider listed on [email or web address] 05/16/2023, 11:34 AM

## 2023-05-16 NOTE — Plan of Care (Signed)

## 2023-05-16 NOTE — Progress Notes (Signed)
Mobility Specialist Progress Note   05/16/23 1200  Mobility  Activity Ambulated with assistance in hallway  Level of Assistance Contact guard assist, steadying assist  Assistive Device Front wheel walker  Distance Ambulated (ft) 260 ft  Activity Response Tolerated well  Mobility Referral Yes  $Mobility charge 1 Mobility  Mobility Specialist Start Time (ACUTE ONLY) 1130  Mobility Specialist Stop Time (ACUTE ONLY) 1210  Mobility Specialist Time Calculation (min) (ACUTE ONLY) 40 min   Received pt in BR requesting assistance back to bed but agreeable to hallway ambulation before. Pt able to stand and ambulate to doorway w/o an AD but requesting one for hallway ambulation. Steady gate throughout but pt fatiguing as distance progressed. Returned back to bed w/o fault but pt expressing pain in RLQ. Left w/ call bell in reach and bed alarm on and notified RN.  Frederico Hamman Mobility Specialist Please contact via SecureChat or  Rehab office at [phone]

## 2023-05-16 NOTE — Evaluation (Signed)
Occupational Therapy Evaluation Patient Details Name: Kendra Simmons MRN: [id number] DOB: [birth year] Today's Date: 05/16/2023   History of Present Illness 72 yo female presenting 5/29 via EMS from home with orthostatic hypotension and frequent falls over the past two weeks. D-dimer elevated, but CTA chest unrevealing. Recent admission and discharged 5/16-5/19. PMHx: HLD, mild dementia, anxiety, depression, arthritis, obesity, GERD. anxiety/depression, obesity, GERD, HLD, insomnia, memory loss   Clinical Impression   PTA, pt living alone; reports she was independent and driving, but meal prep, cleaning, and grocery shopping have become more difficult. Pt endorses 7+ falls in the past two weeks. Upon eval, pt performing UB ADL with set-up and LB ADL with min guard A. Pt with fair problem solving but decr overall safety awareness during BADL; more significant limitations in executive function (working memory, delayed memory recall, problem solving, etc). RN reporting medical team with concerns that incorrect medication management contributing to orthostatic hypotension. Due to cognition and frequent falls, Patient will benefit from continued inpatient follow up therapy, <3 hours/day, but pt unlikely to be agreeable.  Supine: BP 132/52; Pulse: 90 bpm Sitting: 130/69 (88); Pulse 89 bpm Standing 0 minutes: 97/68 (78); Pulse 98 bpm Standing 3 mins: 93/58 (70); Pulse  106 bpm Seated 138/79 (96): Pulse 96 bpm     Recommendations for follow up therapy are one component of a multi-disciplinary discharge planning process, led by the attending physician.  Recommendations may be updated based on patient status, additional functional criteria and insurance authorization.   Assistance Recommended at Discharge Intermittent Supervision/Assistance  Patient can return home with the following A little help with walking and/or transfers;A little help with bathing/dressing/bathroom;Assistance with  cooking/housework;Direct supervision/assist for financial management;Direct supervision/assist for medications management;Assist for transportation;Help with stairs or ramp for entrance    Functional Status Assessment  Patient has had a recent decline in their functional status and demonstrates the ability to make significant improvements in function in a reasonable and predictable amount of time.  Equipment Recommendations  None recommended by OT    Recommendations for Other Services PT consult;Speech consult (speech: cog)     Precautions / Restrictions Precautions Precautions: Fall Precaution Comments: soft orthostatics Restrictions Weight Bearing Restrictions: No      Mobility Bed Mobility Overal bed mobility: Modified Independent                  Transfers Overall transfer level: Needs assistance Equipment used: None Transfers: Sit to/from Stand Sit to Stand: Min guard           General transfer comment: STS from EOB      Balance Overall balance assessment: History of Falls, Needs assistance Sitting-balance support: Feet supported Sitting balance-Leahy Scale: Good     Standing balance support: No upper extremity supported Standing balance-Leahy Scale: Fair Standing balance comment: static standing without BUE support                           ADL either performed or assessed with clinical judgement   ADL Overall ADL's : Needs assistance/impaired Eating/Feeding: Sitting;Modified independent   Grooming: Set up;Sitting   Upper Body Bathing: Sitting;Set up   Lower Body Bathing: Min guard   Upper Body Dressing : Set up;Sitting   Lower Body Dressing: Min guard;Sitting/lateral leans Lower Body Dressing Details (indicate cue type and reason): donning socks EOB Toilet Transfer: Min guard;Ambulation;Regular Toilet   Toileting- Architect and Hygiene: Min guard       Functional mobility  during ADLs: Min guard       Vision  Baseline Vision/History: 0 No visual deficits (reading glasses) Ability to See in Adequate Light: 0 Adequate Patient Visual Report: No change from baseline Vision Assessment?: No apparent visual deficits     Perception Perception Perception Tested?: No   Praxis Praxis Praxis tested?: Not tested    Pertinent Vitals/Pain Pain Assessment Pain Assessment: 0-10 Pain Score: 3  Pain Location: L hip Pain Descriptors / Indicators: Aching, Sore Pain Intervention(s): Limited activity within patient's tolerance, Monitored during session     Hand Dominance Right   Extremity/Trunk Assessment Upper Extremity Assessment Upper Extremity Assessment: Generalized weakness   Lower Extremity Assessment Lower Extremity Assessment: Defer to PT evaluation   Cervical / Trunk Assessment Cervical / Trunk Assessment: Normal   Communication Communication Communication: No difficulties   Cognition Arousal/Alertness: Awake/alert Behavior During Therapy: WFL for tasks assessed/performed Overall Cognitive Status: History of cognitive impairments - at baseline                                 General Comments: Pt follows all commands throughout session, baseline dementia. Tangential, perseverating on what MD has said. Poor ability to count backward from 20 down to 1. Recalling 2/3 words on delayed memory recall task. Unable to perform simple financial management questions. RN reporting medical team with concerns re: medication management affecting BPs     General Comments  VSS on RA    Exercises     Shoulder Instructions      Home Living Family/patient expects to be discharged to:: Private residence Living Arrangements: Alone Available Help at Discharge: Friend(s);Available PRN/intermittently Type of Home: Mobile home Home Access: Ramped entrance     Home Layout: One level     Bathroom Shower/Tub: Chief Strategy Officer: Standard     Home Equipment: Rollator (4  wheels);BSC/3in1;Shower seat          Prior Functioning/Environment Prior Level of Function : Independent/Modified Independent;Driving;History of Falls (last six months)             Mobility Comments: use of rollator in the home; holds onto cart in grocery store ADLs Comments: indepentent; reports she is fearful of falling in community; more difficulty cooking and cleaning; 7 falls in the past ~2 weeks        OT Problem List: Decreased range of motion;Decreased strength;Decreased activity tolerance;Impaired balance (sitting and/or standing);Impaired vision/perception;Decreased cognition      OT Treatment/Interventions: Self-care/ADL training;Therapeutic exercise;Energy conservation;DME and/or AE instruction;Therapeutic activities;Patient/family education;Balance training    OT Goals(Current goals can be found in the care plan section) Acute Rehab OT Goals Patient Stated Goal: go home OT Goal Formulation: With patient Time For Goal Achievement: 05/31/23 Potential to Achieve Goals: Good  OT Frequency: Min 2X/week    Co-evaluation              AM-PAC OT "6 Clicks" Daily Activity     Outcome Measure Help from another person eating meals?: None Help from another person taking care of personal grooming?: A Little Help from another person toileting, which includes using toliet, bedpan, or urinal?: A Little Help from another person bathing (including washing, rinsing, drying)?: A Little Help from another person to put on and taking off regular upper body clothing?: A Little Help from another person to put on and taking off regular lower body clothing?: A Little 6 Click Score: 19   End of  Session Nurse Communication: Mobility status;Other (comment) (BPs)  Activity Tolerance: Patient tolerated treatment well Patient left: in bed;with call bell/phone within reach;with bed alarm set  OT Visit Diagnosis: Unsteadiness on feet (R26.81);Other abnormalities of gait and mobility  (R26.89);Repeated falls (R29.6);Muscle weakness (generalized) (M62.81);History of falling (Z91.81)                Time: 1610-9604 OT Time Calculation (min): 36 min Charges:  OT General Charges $OT Visit: 1 Visit OT Evaluation $OT Eval Moderate Complexity: 1 Mod OT Treatments $Self Care/Home Management : 8-22 mins  Tyler Deis, OTR/L [facility] Acute Rehabilitation Office: [phone]   Kendra Simmons 05/16/2023, 9:03 AM

## 2023-05-16 NOTE — NC FL2 (Addendum)
MEDICAID FL2 LEVEL OF CARE FORM     IDENTIFICATION  Patient Name: Kendra Simmons Birthdate: 10-03-1951 Sex: female Admission Date (Current Location): 05/15/2023  [facility] and [place] Number:  Producer, television/film/video and Address:  [facility], [address], [place], [place] [address]      Provider Number: [id number]  Attending Physician Name and Address:  Joycelyn Das, MD  Relative Name and Phone Number:       Current Level of Care: Hospital Recommended Level of Care: Skilled Nursing Facility Prior Approval Number:    Date Approved/Denied:   PASRR Number: [id number]  Discharge Plan: SNF    Current Diagnoses: Patient Active Problem List   Diagnosis Date Noted   Orthostatic hypotension 05/15/2023   Falls frequently 05/15/2023   Generalized weakness 05/02/2023   Acute posttraumatic stress disorder 10/24/2022   Abdominal contusion 10/19/2022   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   Sleep paralysis, 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   Sepsis without acute organ dysfunction (HCC)    Regurgitation of stomach contents    Esophageal dysphagia    Community acquired pneumonia of right upper lobe of lung 12/08/2018   Chronic bilateral low back pain 07/01/2017   Dehydration 07/03/2015   UTI (lower urinary tract infection) 07/03/2015   Hypokalemia 07/03/2015   AKI (acute kidney injury) (HCC) 07/03/2015   Tachycardia 05/27/2015   MDD (major depressive disorder), recurrent, severe, 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   Mild neurocognitive disorder 05/25/2015   Insomnia disorder, with non-sleep disorder mental comorbidity, 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   Snoring 12/24/2014   Unintended weight loss 12/24/2014   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   Major depressive disorder, recurrent, severe without psychotic features (HCC)    Panic attacks 07/26/2014    Thrombocytopenia (HCC) 12/17/2012   Major depressive disorder, recurrent episode, moderate (HCC) 07/09/2012   DYSLIPIDEMIA 03/13/2010   GERD 03/13/2010   Depression 03/13/2010    Orientation RESPIRATION BLADDER Height & Weight     Self, Time, Situation, Place  Normal Continent Weight: 82.9 kg Height:  5' 4.02" (162.6 cm)  BEHAVIORAL SYMPTOMS/MOOD NEUROLOGICAL BOWEL NUTRITION STATUS      Continent Diet  AMBULATORY STATUS COMMUNICATION OF NEEDS Skin   Limited Assist Verbally Bruising (scattered bruising)                       Personal Care Assistance Level of Assistance  Bathing, Feeding, Dressing Bathing Assistance: Limited assistance Feeding assistance: Independent Dressing Assistance: Limited assistance     Functional Limitations Info  Sight, Hearing, Speech Sight Info: Adequate Hearing Info: Adequate Speech Info: Adequate    SPECIAL CARE FACTORS FREQUENCY  PT (By licensed PT), OT (By licensed OT)     PT Frequency: 5 x per week OT Frequency: 5 x per week            Contractures Contractures Info: Not present    Additional Factors Info  Code Status, Allergies, Psychotropic Code Status Info: Full code Allergies Info: Phentermine, Augmentin Psychotropic Info: Seroquel, Wellbutrin, Klonopin, Namenda, Lyrica         Current Medications (05/16/2023):  This is the current hospital active medication list Current Facility-Administered Medications  Medication Dose Route Frequency Provider Last Rate Last Admin   0.9 %  sodium chloride infusion   Intravenous Continuous Pokhrel, Laxman, MD 125 mL/hr at 05/16/23 0949 New Bag at 05/16/23 0949   0.9 %  sodium chloride  infusion  250 mL Intravenous PRN Pokhrel, Laxman, MD       acetaminophen (TYLENOL) tablet 1,000 mg  1,000 mg Oral Q6H PRN Pokhrel, Laxman, MD   1,000 mg at 05/16/23 1616   acidophilus (RISAQUAD) capsule 1 capsule  1 capsule Oral Daily Pokhrel, Laxman, MD   1 capsule at 05/16/23 1220   buPROPion  (WELLBUTRIN XL) 24 hr tablet 150 mg  150 mg Oral Daily Pokhrel, Laxman, MD   150 mg at 05/16/23 1220   clonazePAM (KLONOPIN) disintegrating tablet 0.25 mg  0.25 mg Oral QHS PRN Pokhrel, Laxman, MD   0.25 mg at 05/15/23 2059   diclofenac Sodium (VOLTAREN) 1 % topical gel 2 g  2 g Topical Daily PRN Pokhrel, Laxman, MD       enoxaparin (LOVENOX) injection 40 mg  40 mg Subcutaneous Q24H Pokhrel, Laxman, MD   40 mg at 05/15/23 2104   fluticasone (FLONASE) 50 MCG/ACT nasal spray 1 spray  1 spray Each Nare Daily Pokhrel, Laxman, MD   1 spray at 05/16/23 0946   memantine (NAMENDA) tablet 10 mg  10 mg Oral BID Pokhrel, Laxman, MD   10 mg at 05/16/23 1220   midodrine (PROAMATINE) tablet 5 mg  5 mg Oral TID WC Pokhrel, Laxman, MD   5 mg at 05/16/23 1616   nystatin (MYCOSTATIN/NYSTOP) topical powder 1 Application  1 Application Topical BID PRN Pokhrel, Laxman, MD       ondansetron (ZOFRAN-ODT) disintegrating tablet 4 mg  4 mg Oral Q8H PRN Pokhrel, Laxman, MD       pantoprazole (PROTONIX) EC tablet 40 mg  40 mg Oral Daily Pokhrel, Laxman, MD   40 mg at 05/16/23 0946   polycarbophil (FIBERCON) tablet 625 mg  625 mg Oral Daily Pokhrel, Laxman, MD   625 mg at 05/16/23 0945   polyethylene glycol (MIRALAX / GLYCOLAX) packet 17 g  17 g Oral QODAY Pokhrel, Laxman, MD   17 g at 05/16/23 0945   pregabalin (LYRICA) capsule 50 mg  50 mg Oral QHS Pokhrel, Laxman, MD   50 mg at 05/15/23 2101   QUEtiapine (SEROQUEL) tablet 100 mg  100 mg Oral QHS Pokhrel, Laxman, MD   100 mg at 05/15/23 2101   rosuvastatin (CRESTOR) tablet 10 mg  10 mg Oral Daily Pokhrel, Laxman, MD   10 mg at 05/16/23 0945   sodium chloride flush (NS) 0.9 % injection 3 mL  3 mL Intravenous Q12H Pokhrel, Laxman, MD   3 mL at 05/16/23 0950   sodium chloride flush (NS) 0.9 % injection 3 mL  3 mL Intravenous PRN Pokhrel, Laxman, MD       tamsulosin (FLOMAX) capsule 0.4 mg  0.4 mg Oral Daily Pokhrel, Laxman, MD   0.4 mg at 05/16/23 1220     Discharge  Medications: Please see discharge summary for a list of discharge medications.  Relevant Imaging Results:  Relevant Lab Results:   Additional Information SS# [id number]  Janae Bridgeman, RN

## 2023-05-16 NOTE — Progress Notes (Signed)
   05/16/23 1027  Orthostatic Lying   BP- Lying 126/72  Pulse- Lying 75  Orthostatic Sitting  BP- Sitting 134/69  Pulse- Sitting 74  Orthostatic Standing at 0 minutes  BP- Standing at 0 minutes 105/57  Pulse- Standing at 0 minutes 85  Orthostatic Standing at 3 minutes  BP- Standing at 3 minutes (!) 87/60  Pulse- Standing at 3 minutes 97   BP, HR  standing 6 minutes (post-toileting): 95/68, 104 bpm   Janthony Holleman S, PT DPT Acute Rehabilitation Services Secure Chat Preferred  Office [phone]

## 2023-05-16 NOTE — TOC Initial Note (Signed)
Transition of Care [facility]) - Initial/Assessment Note    Patient Details  Name: Kendra Simmons MRN: [id number] Date of Birth: [birth year]  Transition of Care [facility]) CM/SW Contact:    Janae Bridgeman, RN Phone Number: 05/16/2023, 4:17 PM  Clinical Narrative:                 CM met with the patient at the bedside to discuss TOC needs.  The patient lives alone and was admitted for frequent falls and hypotension.  The patient would like to return home with home health.  The patient states that she would be agreeable to go to a SNF if it is "nice and close to home, otherwise she wants to go home with home health.  Patient is active with [facility] but patient states that never came to the house for the initial assessment since she was last discharged home from the hospital.  Patient states that she doesn't answer her phone if she does not recognize the number.  I will fax her out to SNF work up but patient will likely discharge home with home health services if she does not receive an agreeable bed offer.  Expected Discharge Plan: Home w Home Health Services Barriers to Discharge: Continued Medical Work up   Patient Goals and CMS Choice Patient states their goals for this hospitalization and ongoing recovery are:: To return home CMS Medicare.gov Compare Post Acute Care list provided to:: Patient Choice offered to / list presented to : Patient [place] ownership interest in [facility].provided to:: Patient    Expected Discharge Plan and Services   Discharge Planning Services: CM Consult Post Acute Care Choice: Home Health Living arrangements for the past 2 months: Mobile Home                           HH Arranged: PT [facility] Agency: [facility] Date [facility] Agency Contacted: 05/16/23 Time HH Agency Contacted: 1616 Representative spoke with at [facility] Agency: Mayra Reel, RNCM with [facility]  Prior Living Arrangements/Services Living arrangements for the past 2  months: Mobile Home Lives with:: Self Patient language and need for interpreter reviewed:: Yes Do you feel safe going back to the place where you live?: Yes      Need for Family Participation in Patient Care: Yes (Comment) Care giver support system in place?: Yes (comment) Current home services: DME (Rolator, BSC, 3:1, shower seat) Criminal Activity/Legal Involvement Pertinent to Current Situation/Hospitalization: No - Comment as needed  Activities of Daily Living Home Assistive Devices/Equipment: Walker (specify type) ADL Screening (condition at time of admission) Patient's cognitive ability adequate to safely complete daily activities?: Yes Is the patient deaf or have difficulty hearing?: No Does the patient have difficulty seeing, even when wearing glasses/contacts?: No Does the patient have difficulty concentrating, remembering, or making decisions?: No Patient able to express need for assistance with ADLs?: Yes Does the patient have difficulty dressing or bathing?: No Independently performs ADLs?: No Communication: Independent Dressing (OT): Independent Grooming: Independent Feeding: Independent Bathing: Independent Toileting: Independent In/Out Bed: Independent Walks in Home: Independent with device (comment) Is this a change from baseline?: Pre-admission baseline Does the patient have difficulty walking or climbing stairs?: Yes Weakness of Legs: Both Weakness of Arms/Hands: None  Permission Sought/Granted Permission sought to share information with : Case Manager Permission granted to share information with : Yes, Verbal Permission Granted     Permission granted to share info w AGENCY: [phone]  HH        Emotional Assessment Appearance:: Appears stated age Attitude/Demeanor/Rapport: Gracious Affect (typically observed): Accepting Orientation: : Oriented to Self, Oriented to Place, Oriented to  Time, Oriented to Situation Alcohol / Substance Use: Not  Applicable Psych Involvement: No (comment)  Admission diagnosis:  Orthostatic hypotension [I95.1] Frequent falls [R29.6] Lives alone without help available [Z60.2] Patient Active Problem List   Diagnosis Date Noted   Orthostatic hypotension 05/15/2023   Falls frequently 05/15/2023   Generalized weakness 05/02/2023   Acute posttraumatic stress disorder 10/24/2022   Abdominal contusion 10/19/2022   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   Sleep paralysis, 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   Sepsis without acute organ dysfunction (HCC)    Regurgitation of stomach contents    Esophageal dysphagia    Community acquired pneumonia of right upper lobe of lung 12/08/2018   Chronic bilateral low back pain 07/01/2017   Dehydration 07/03/2015   UTI (lower urinary tract infection) 07/03/2015   Hypokalemia 07/03/2015   AKI (acute kidney injury) (HCC) 07/03/2015   Tachycardia 05/27/2015   MDD (major depressive disorder), recurrent, severe, 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   Mild neurocognitive disorder 05/25/2015   Insomnia disorder, with non-sleep disorder mental comorbidity, 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 [place] [address] Phone: [phone] Fax: [phone]  [facility] - [place], [place] - [address] [place] [address] Phone: [phone] Fax: [phone]  [facility] [address] [place] [address] Phone: [phone] Fax: [phone]     Social Determinants of Health (SDOH) Social History: SDOH Screenings   Food Insecurity: No Food Insecurity (05/15/2023)  Housing: Low Risk  (05/15/2023)  Transportation Needs: No Transportation Needs (05/15/2023)  Utilities: Not At Risk (05/15/2023)  Depression (PHQ2-9): Low Risk  (12/18/2022)  Tobacco Use: Low Risk  (05/15/2023)   SDOH Interventions: Food Insecurity Interventions: Intervention Not Indicated Housing Interventions: Intervention Not Indicated Transportation Interventions: Intervention Not Indicated, Inpatient TOC, Patient Resources (Friends/Family) Utilities Interventions: Intervention Not Indicated   Readmission Risk Interventions    05/03/2023   12:51 PM 10/30/2022   11:39 AM  Readmission Risk Prevention Plan  Transportation Screening Complete Complete  PCP or Specialist Appt within 5-7 Days  Complete  PCP or Specialist Appt within 3-5 Days Complete   Home Care Screening  Complete  Medication Review (RN CM)  Complete  HRI or Home Care Consult Complete   Social Work Consult for Recovery Care Planning/Counseling Complete   Palliative Care Screening Not Applicable   Medication Review Oceanographer) Referral to Pharmacy

## 2023-05-16 NOTE — Progress Notes (Signed)
RE:  Kendra Simmons Date of Birth:  [birth year] Date:  05/16/2023  To Whom It May Concern,  Please be advised that the above-named patient will require a short-term nursing home stay  - anticipated 30 days or less for rehabilitation and strengthening.  The plan is for return home.

## 2023-05-16 NOTE — Evaluation (Signed)
Physical Therapy Evaluation Patient Details Name: Kendra Simmons MRN: [id number] DOB: 01-Mar-1951 Today's Date: 05/16/2023  History of Present Illness  72 yo female presenting 5/29 via EMS from home with orthostatic hypotension and frequent falls over the past two weeks. D-dimer elevated, but CTA chest unrevealing. Recent admission and discharged 5/16-5/19. PMHx: HLD, mild dementia, anxiety, depression, arthritis, obesity, GERD. anxiety/depression, obesity, GERD, HLD, insomnia, memory loss  Clinical Impression   Pt presents with generalized weakness, impaired balance with history of falls, symptomatic orthostatic hypotension, impaired activity tolerance. Pt to benefit from acute PT to address deficits. Pt ambulated hallway distance with use of RW and supervision for safety, pt with reports of mild lightheadedness during gait. Pt lives alone, concern for pt safety given lack of insight into deficits and mobility impairments. Pt may benefit from short-term inpatient rehabilitation until BP issues resolve given lack of safety awareness, as well as to address physical deficits and fall risk. Pt reports her preference is to d/c home, if she does d/c home recommend HHPT and increased support from friends at d/c. PT emphasized importance of taking her time with mobility at home, especially with positional changes given orthostasis, pt states she often rushes at home which is when she has issues with falling. Would also benefit from trying compression hose. PT to progress mobility as tolerated, and will continue to follow acutely.         Recommendations for follow up therapy are one component of a multi-disciplinary discharge planning process, led by the attending physician.  Recommendations may be updated based on patient status, additional functional criteria and insurance authorization.  Follow Up Recommendations       Assistance Recommended at Discharge PRN  Patient can return home with the  following  Assistance with cooking/housework;Help with stairs or ramp for entrance;Assist for transportation;A little help with walking and/or transfers    Equipment Recommendations None recommended by PT  Recommendations for Other Services       Functional Status Assessment Patient has had a recent decline in their functional status and demonstrates the ability to make significant improvements in function in a reasonable and predictable amount of time.     Precautions / Restrictions Precautions Precautions: Fall Precaution Comments: watch BP Restrictions Weight Bearing Restrictions: No      Mobility  Bed Mobility Overal bed mobility: Needs Assistance Bed Mobility: Supine to Sit, Sit to Supine     Supine to sit: HOB elevated, Supervision Sit to supine: Supervision, HOB elevated   General bed mobility comments: supine<>sit with HOB elevated    Transfers Overall transfer level: Needs assistance Equipment used: None Transfers: Sit to/from Stand Sit to Stand: Supervision           General transfer comment: for safety, slow to rise but no physical assist. Stand x3, from EOB x2 and toilet x1    Ambulation/Gait Ambulation/Gait assistance: Supervision Gait Distance (Feet): 100 Feet Assistive device: Rolling walker (2 wheels) Gait Pattern/deviations: Step-through pattern, Decreased stride length Gait velocity: decr     General Gait Details: slowed, use of RW for steadying  Stairs            Wheelchair Mobility    Modified Rankin (Stroke Patients Only)       Balance Overall balance assessment: History of Falls, Needs assistance Sitting-balance support: Feet supported Sitting balance-Leahy Scale: Good     Standing balance support: No upper extremity supported Standing balance-Leahy Scale: Fair Standing balance comment: static standing without BUE support  Pertinent Vitals/Pain Pain Assessment Pain Assessment:  Faces Faces Pain Scale: Hurts a little bit Pain Location: L hip Pain Descriptors / Indicators: Sore, Discomfort Pain Intervention(s): Limited activity within patient's tolerance, Monitored during session, Repositioned    Home Living Family/patient expects to be discharged to:: Private residence Living Arrangements: Alone Available Help at Discharge: Friend(s);Available PRN/intermittently Type of Home: Mobile home Home Access: Ramped entrance       Home Layout: One level Home Equipment: Rollator (4 wheels);BSC/3in1;Shower seat      Prior Function Prior Level of Function : Independent/Modified Independent;Driving;History of Falls (last six months)             Mobility Comments: use of rollator in the home; holds onto cart in grocery store. has a gravel sidewalk and driveway at home, does not use AD outside ADLs Comments: indepentent; reports she is fearful of falling in community; more difficulty cooking and cleaning; 7 falls in the past ~2 weeks     Hand Dominance   Dominant Hand: Right    Extremity/Trunk Assessment   Upper Extremity Assessment Upper Extremity Assessment: Defer to OT evaluation    Lower Extremity Assessment Lower Extremity Assessment: Generalized weakness    Cervical / Trunk Assessment Cervical / Trunk Assessment: Normal  Communication   Communication: No difficulties  Cognition Arousal/Alertness: Awake/alert Behavior During Therapy: WFL for tasks assessed/performed Overall Cognitive Status: History of cognitive impairments - at baseline                                 General Comments: tangential, requires repeated cuing to follow commands at times. Pt lacks understanding of current functional ability and  history of dementia documented.        General Comments General comments (skin integrity, edema, etc.): VSS on RA    Exercises     Assessment/Plan    PT Assessment Patient needs continued PT services  PT Problem List  Decreased strength;Decreased activity tolerance;Decreased balance;Decreased mobility;Decreased knowledge of use of DME;Decreased safety awareness;Cardiopulmonary status limiting activity       PT Treatment Interventions DME instruction;Therapeutic exercise;Gait training;Balance training;Stair training;Functional mobility training;Therapeutic activities;Patient/family education;Neuromuscular re-education    PT Goals (Current goals can be found in the Care Plan section)  Acute Rehab PT Goals Patient Stated Goal: go home PT Goal Formulation: With patient Time For Goal Achievement: 05/30/23 Potential to Achieve Goals: Good    Frequency Min 3X/week     Co-evaluation               AM-PAC PT "6 Clicks" Mobility  Outcome Measure Help needed turning from your back to your side while in a flat bed without using bedrails?: None Help needed moving from lying on your back to sitting on the side of a flat bed without using bedrails?: None Help needed moving to and from a bed to a chair (including a wheelchair)?: A Little Help needed standing up from a chair using your arms (e.g., wheelchair or bedside chair)?: A Little Help needed to walk in hospital room?: A Little Help needed climbing 3-5 steps with a railing? : A Little 6 Click Score: 20    End of Session Equipment Utilized During Treatment: Gait belt Activity Tolerance: Patient tolerated treatment well;Treatment limited secondary to medical complications (Comment) (orthostatic hypotension) Patient left: in bed;with call bell/phone within reach;with nursing/sitter in room;Other (comment) Nurse Communication: Mobility status;Other (comment) (orthostatic hypotension) PT Visit Diagnosis: History of falling (Z91.81);Unsteadiness on feet (  R26.81);Muscle weakness (generalized) (M62.81)    Time: 1610-9604 PT Time Calculation (min) (ACUTE ONLY): 27 min   Charges:   PT Evaluation $PT Eval Low Complexity: 1 Low PT Treatments $Therapeutic  Activity: 8-22 mins        Marye Round, PT DPT Acute Rehabilitation Services Secure Chat Preferred  Office [phone]   Torrion Witter E Stroup 05/16/2023, 10:45 AM

## 2023-05-17 DIAGNOSIS — F331 Major depressive disorder, recurrent, moderate: Secondary | ICD-10-CM | POA: Diagnosis not present

## 2023-05-17 DIAGNOSIS — I951 Orthostatic hypotension: Secondary | ICD-10-CM | POA: Diagnosis not present

## 2023-05-17 DIAGNOSIS — R531 Weakness: Secondary | ICD-10-CM | POA: Diagnosis not present

## 2023-05-17 DIAGNOSIS — R296 Repeated falls: Secondary | ICD-10-CM | POA: Diagnosis not present

## 2023-05-17 LAB — CBC
HCT: 33.4 % — ABNORMAL LOW (ref 36.0–46.0)
Hemoglobin: 10.6 g/dL — ABNORMAL LOW (ref 12.0–15.0)
MCH: 30.5 pg (ref 26.0–34.0)
MCHC: 31.7 g/dL (ref 30.0–36.0)
MCV: 96.3 fL (ref 80.0–100.0)
Platelets: 117 10*3/uL — ABNORMAL LOW (ref 150–400)
RBC: 3.47 MIL/uL — ABNORMAL LOW (ref 3.87–5.11)
RDW: 12.8 % (ref 11.5–15.5)
WBC: 3.2 10*3/uL — ABNORMAL LOW (ref 4.0–10.5)
nRBC: 0 % (ref 0.0–0.2)

## 2023-05-17 LAB — BASIC METABOLIC PANEL
Anion gap: 9 (ref 5–15)
BUN: 7 mg/dL — ABNORMAL LOW (ref 8–23)
CO2: 25 mmol/L (ref 22–32)
Calcium: 8.7 mg/dL — ABNORMAL LOW (ref 8.9–10.3)
Chloride: 106 mmol/L (ref 98–111)
Creatinine, Ser: 1 mg/dL (ref 0.44–1.00)
GFR, Estimated: 60 mL/min — ABNORMAL LOW (ref >60)
Glucose, Bld: 106 mg/dL — ABNORMAL HIGH (ref 70–99)
Potassium: 3.6 mmol/L (ref 3.5–5.1)
Sodium: 140 mmol/L (ref 135–145)

## 2023-05-17 LAB — MISC LABCORP TEST (SEND OUT): Labcorp test code: 81950

## 2023-05-17 LAB — MAGNESIUM: Magnesium: 2 mg/dL (ref 1.7–2.4)

## 2023-05-17 MED ORDER — MIDODRINE HCL 5 MG PO TABS: 5.0000 mg | ORAL_TABLET | Freq: Three times a day (TID) | ORAL | 2 refills | Status: AC

## 2023-05-17 NOTE — Discharge Instructions (Signed)
Recommendations for Outpatient Follow-Up:    Follow up with your primary care provider in one week.  Check CBC, BMP, magnesium in the next visit Take orthostatic precautions, patient has been encouraged to use elastic compression stockings. Prescribed midodrine for orthostatic hypotension, encouraged to hydration.   Please continue to discuss about reducing her polypharmacy to reduce the risk of falls and orthostatic hypotension. Patient would benefit from neurology follow-up as outpatient due to history of falls, balance issues and orthostatic hypotension. Recommend follow-up with psychiatry to adjust psychiatric Medications if possible

## 2023-05-17 NOTE — Progress Notes (Signed)
Today is my first day taking care of [facility]. She was alert and oriented with me during my shift. She walked to the bathroom this morning with a walker with no problem. She did not complain of any dizziness. I updated her about discharging today. She is calling a friend and ready to go.

## 2023-05-17 NOTE — TOC Progression Note (Addendum)
Transition of Care [facility]) - Progression Note    Patient Details  Name: Kendra Simmons MRN: [id number] Date of Birth: 07/19/1951  Transition of Care [facility]) CM/SW Contact  Janae Bridgeman, RN Phone Number: 05/17/2023, 11:30 AM  Clinical Narrative:    CM met with the patient at the bedside and she has declined bed offers from [facility] and wants to go home with home health services.    I called and left a message with Alyssa, CM with [facility].  Patient is anxious to discharge to home today with home health services and will likely discharge home by car with friend providing transportation.  05/17/23 1137 - CM was unable to reach [facility].  I called Andria Frames, RNCM with [facility] services for PT and aide.   Expected Discharge Plan: Home w Home Health Services Barriers to Discharge: Continued Medical Work up  Expected Discharge Plan and Services   Discharge Planning Services: CM Consult Post Acute Care Choice: Home Health Living arrangements for the past 2 months: Mobile Home                           HH Arranged: PT [facility] Agency: [facility] Date [facility] Agency Contacted: 05/16/23 Time HH Agency Contacted: 1616 Representative spoke with at [facility] Agency: Mayra Reel, RNCM with [facility]   Social Determinants of Health (SDOH) Interventions SDOH Screenings   Food Insecurity: No Food Insecurity (05/15/2023)  Housing: Low Risk  (05/15/2023)  Transportation Needs: No Transportation Needs (05/15/2023)  Utilities: Not At Risk (05/15/2023)  Depression (PHQ2-9): Low Risk  (12/18/2022)  Tobacco Use: Low Risk  (05/15/2023)    Readmission Risk Interventions    05/03/2023   12:51 PM 10/30/2022   11:39 AM  Readmission Risk Prevention Plan  Transportation Screening Complete Complete  PCP or Specialist Appt within 5-7 Days  Complete  PCP or Specialist Appt within 3-5 Days Complete   Home Care Screening  Complete  Medication  Review (RN CM)  Complete  HRI or Home Care Consult Complete   Social Work Consult for Recovery Care Planning/Counseling Complete   Palliative Care Screening Not Applicable   Medication Review Oceanographer) Referral to Pharmacy

## 2023-05-17 NOTE — Plan of Care (Signed)

## 2023-05-17 NOTE — Discharge Summary (Signed)
Physician Discharge Summary  Kendra Simmons ZOX:[id number] DOB: Jun 17, 1951 DOA: 05/15/2023  PCP: Aliene Beams, MD  Admit date: 05/15/2023 Discharge date: 05/17/2023  Admitted From: Home  Discharge disposition: Home with home health  Recommendations for Outpatient Follow-Up:   Follow up with your primary care provider in one week.  Check CBC, BMP, magnesium in the next visit Take orthostatic precautions, patient has been encouraged to use elastic compression stockings. Prescribed midodrine for orthostatic hypotension, encouraged to hydration.   Please continue to discuss about reducing her polypharmacy to reduce the risk of falls and orthostatic hypotension. Patient would benefit from neurology follow-up as outpatient due to history of falls, balance issues and orthostatic hypotension. Recommend follow-up with psychiatry to adjust psychiatric Medications if possible.     Discharge Diagnosis:   Principal Problem:   Orthostatic hypotension Active Problems:   Major depressive disorder, recurrent episode, moderate (HCC)   Generalized weakness   Falls frequently   Discharge Condition: Improved.  Diet recommendation:Regular.  Wound care: None.  Code status: Full.   History of Present Illness:   Kendra Simmons is a 72 y.o. female with PMH significant for obesity, HLD, mild dementia, anxiety/depression, arthritis, GERD presented to hospital with frequent falls dizziness lightheadedness on standing up.  Of note, patient was recently discharged from the hospital on 05/05/2023 but then continues to have falls at home.  Lives by herself at home.  In the ED, patient was extremely dizzy and lightheaded when trying to stand up and had significant orthostatic hypotension.  Neurological examination was unremarkable.   D-dimer was elevated so CTA chest was performed in the ED which did not show any evidence of pulmonary embolism or lobar pneumonia.  CBC showing hemoglobin  of 12.3.  BMP with a creatinine of 1.1.   Patient was given 2 L of IV fluid bolus in the ED and was considered for admission to the hospital for orthostatic hypotension and frequent falls.    Hospital Course:   Following conditions were addressed during hospitalization as listed below,  Orthostatic hypotension,Frequent falls Generalized weakness Patient did have significant orthostatic hypotension in the ED with symptoms.    Received 2 L of IV fluid bolus in the ED followed by IV hydration during hospitalization.  Patient was started on midodrine as well.  At this time patient still has orthostatic hypotension but is asymptomatic.  Patient is on multiple psychiatric medication but very resistant to changing the regimen that she is on it for several years now..Patient has been seen by physical therapy who recommended skilled nursing facility placement but despite multiple conversations with the patient patient wishes to go home with home health services.  Advised orthostatic precautions and fall precautions.  Very high risk of falls and rebound to the hospital.  She is very aware of this.  Patient has been encouraged to follow-up with her neurologist as outpatient.  Dementia  anxiety/depression On multiple medications including Namenda, amitriptyline, Wellbutrin, Lyrica, amitriptyline, Klonopin, Robaxin as outpatient.  On last admission extensive discussion was held regarding her medications and she did not wish to deescalate them and wanted to see her psychiatrist as outpatient.  Patient was encouraged to decrease the doses of amitriptyline, pregabalin, Klonopin at home.  Advised against Robaxin as well.   UTI Bladder spasms/urinary urgency Patient with urinary frequency, bladder spasms.  Continue Flomax on discharge.  CKD 3B Likely at baseline at this time.  Creatinine prior to discharge was 1.0.  Hyperlipidemia Continue Crestor   Dyspnea on exertion.  Likely secondary to deconditioning.   Will check   CTA of the chest was negative for PE.  2D echocardiogram showed LV ejection fraction of 60 to 65% with grade 1 diastolic dysfunction.   GERD Continue omeprazole from home.  Disposition.  At this time, patient is stable for disposition home with home health services.  Patient has declined skilled nursing facility placement for rehab.  Medical Consultants:   None.  Procedures:    None Subjective:   Today, patient seen and examined at bedside.  Did have orthostatic hypotension but was asymptomatic as per the nursing staff.  Despite multiple conversation patient wishes to go home and understands risk of falls.  Discharge Exam:   Vitals:   05/17/23 0939 05/17/23 1235  BP: 116/66 118/69  Pulse: 78 77  Resp:  18  Temp:  (!) 97.5 F (36.4 C)  SpO2:  100%   Vitals:   05/17/23 0507 05/17/23 0812 05/17/23 0939 05/17/23 1235  BP: 133/87 125/65 116/66 118/69  Pulse: 73 86 78 77  Resp: 18 18  18   Temp: 98.5 F (36.9 C) 98.1 F (36.7 C)  (!) 97.5 F (36.4 C)  TempSrc:  Oral  Oral  SpO2: 98% 98%  100%  Weight:      Height:       Body mass index is 31.36 kg/m.  General: Alert awake, not in obvious distress, obese built HENT: pupils equally reacting to light,  No scleral pallor or icterus noted. Oral mucosa is moist.  Chest:   Diminished breath sounds bilaterally. No crackles or wheezes.  CVS: S1 &S2 heard. No murmur.  Regular rate and rhythm. Abdomen: Soft, nontender, nondistended.  Bowel sounds are heard.   Extremities: No cyanosis, clubbing or edema.  Peripheral pulses are palpable. Psych: Alert, awake and oriented, CNS:  No cranial nerve deficits.  Moves all extremities. Skin: Warm and dry.  No rashes noted.  The results of significant diagnostics from this hospitalization (including imaging, microbiology, ancillary and laboratory) are listed below for reference.     Diagnostic Studies:   ECHOCARDIOGRAM COMPLETE  Result Date: 05/16/2023    ECHOCARDIOGRAM  REPORT   Patient Name:   Kendra Simmons Date of Exam: 05/16/2023 Medical Rec #:  [id number]               Height:       64.0 in Accession #:    [id number]              Weight:       182.8 lb Date of Birth:  [birth year]               BSA:          1.883 m Patient Age:    72 years                BP:           138/79 mmHg Patient Gender: F                       HR:           79 bpm. Exam Location:  Inpatient Procedure: 2D Echo, Cardiac Doppler and Color Doppler Indications:    Syncope  History:        Patient has no prior history of Echocardiogram examinations.  Sonographer:    Darlys Gales Referring Phys: [id number] [facility] IMPRESSIONS  1. Left ventricular ejection fraction, by estimation, is 60 to 65%. The  left ventricle has normal function. The left ventricle has no regional wall motion abnormalities. Left ventricular diastolic parameters are consistent with Grade I diastolic dysfunction (impaired relaxation).  2. Right ventricular systolic function is normal. The right ventricular size is normal. Tricuspid regurgitation signal is inadequate for assessing PA pressure.  3. The mitral valve is grossly normal. Trivial mitral valve regurgitation.  4. The aortic valve is tricuspid. Aortic valve regurgitation is not visualized. Aortic valve sclerosis/calcification is present, without any evidence of aortic stenosis.  5. The inferior vena cava is normal in size with greater than 50% respiratory variability, suggesting right atrial pressure of 3 mmHg. Comparison(s): No prior Echocardiogram. FINDINGS  Left Ventricle: Left ventricular ejection fraction, by estimation, is 60 to 65%. The left ventricle has normal function. The left ventricle has no regional wall motion abnormalities. The left ventricular internal cavity size was normal in size. There is  no left ventricular hypertrophy. Left ventricular diastolic parameters are consistent with Grade I diastolic dysfunction (impaired relaxation). Right Ventricle: The  right ventricular size is normal. No increase in right ventricular wall thickness. Right ventricular systolic function is normal. Tricuspid regurgitation signal is inadequate for assessing PA pressure. Left Atrium: Left atrial size was normal in size. Right Atrium: Right atrial size was normal in size. Pericardium: There is no evidence of pericardial effusion. Mitral Valve: The mitral valve is grossly normal. Trivial mitral valve regurgitation. Tricuspid Valve: The tricuspid valve is normal in structure. Tricuspid valve regurgitation is trivial. Aortic Valve: The aortic valve is tricuspid. Aortic valve regurgitation is not visualized. Aortic valve sclerosis/calcification is present, without any evidence of aortic stenosis. Aortic valve mean gradient measures 7.0 mmHg. Aortic valve peak gradient measures 11.0 mmHg. Aortic valve area, by VTI measures 1.72 cm. Pulmonic Valve: The pulmonic valve was not well visualized. Pulmonic valve regurgitation is trivial. Aorta: The aortic root is normal in size and structure. Venous: The inferior vena cava is normal in size with greater than 50% respiratory variability, suggesting right atrial pressure of 3 mmHg. IAS/Shunts: The atrial septum is grossly normal.  LEFT VENTRICLE PLAX 2D LVIDd:         2.90 cm   Diastology LVIDs:         1.90 cm   LV e' medial:    10.80 cm/s LV PW:         0.90 cm   LV E/e' medial:  11.5 LV IVS:        0.90 cm   LV e' lateral:   8.59 cm/s LVOT diam:     1.70 cm   LV E/e' lateral: 14.4 LV SV:         64 LV SV Index:   34 LVOT Area:     2.27 cm  RIGHT VENTRICLE             IVC RV S prime:     10.40 cm/s  IVC diam: 1.40 cm TAPSE (M-mode): 1.8 cm LEFT ATRIUM             Index        RIGHT ATRIUM           Index LA Vol (A2C):   41.4 ml 21.99 ml/m  RA Area:     12.30 cm LA Vol (A4C):   29.1 ml 15.46 ml/m  RA Volume:   27.00 ml  14.34 ml/m LA Biplane Vol: 36.8 ml 19.54 ml/m  AORTIC VALVE AV Area (Vmax):    1.87 cm AV Area (Vmean):  1.71 cm AV Area  (VTI):     1.72 cm AV Vmax:           166.00 cm/s AV Vmean:          120.000 cm/s AV VTI:            0.370 m AV Peak Grad:      11.0 mmHg AV Mean Grad:      7.0 mmHg LVOT Vmax:         137.00 cm/s LVOT Vmean:        90.500 cm/s LVOT VTI:          0.280 m LVOT/AV VTI ratio: 0.76  AORTA Ao Asc diam: 2.30 cm MITRAL VALVE MV Area (PHT): 3.12 cm     SHUNTS MV Decel Time: 243 msec     Systemic VTI:  0.28 m MV E velocity: 124.00 cm/s  Systemic Diam: 1.70 cm MV A velocity: 130.00 cm/s MV E/A ratio:  0.95 Laurance Flatten MD Electronically signed by Laurance Flatten MD Signature Date/Time: 05/16/2023/3:22:25 PM    Final    CT Angio Chest PE W and/or Wo Contrast  Result Date: 05/15/2023 CLINICAL DATA:  Pulmonary embolism (PE) suspected, low to intermediate prob, positive D-dimer. EXAM: CT ANGIOGRAPHY CHEST WITH CONTRAST TECHNIQUE: Multidetector CT imaging of the chest was performed using the standard protocol during bolus administration of intravenous contrast. Multiplanar CT image reconstructions and MIPs were obtained to evaluate the vascular anatomy. RADIATION DOSE REDUCTION: This exam was performed according to the departmental dose-optimization program which includes automated exposure control, adjustment of the mA and/or kV according to patient size and/or use of iterative reconstruction technique. CONTRAST:  75mL OMNIPAQUE IOHEXOL 350 MG/ML SOLN COMPARISON:  CT chest/abdomen/pelvis 10/18/2022. FINDINGS: Cardiovascular: Satisfactory opacification of the pulmonary arteries to the segmental level. No evidence of pulmonary embolism. Normal heart size. No pericardial effusion. Coronary atherosclerosis. Mediastinum/Nodes: No enlarged mediastinal, hilar, or axillary lymph nodes. Thyroid gland, trachea, and esophagus demonstrate no significant findings. Lungs/Pleura: Hypoventilatory changes in the lungs. No consolidation or pulmonary edema. No pleural effusion or pneumothorax. Upper Abdomen: Cholelithiasis without  evidence of acute cholecystitis. Musculoskeletal: No chest wall abnormality. No acute or significant osseous findings. Review of the MIP images confirms the above findings. IMPRESSION: 1. No evidence of pulmonary embolism or other acute intrathoracic process. 2. Cholelithiasis without evidence of acute cholecystitis. 3. Coronary atherosclerosis. Electronically Signed   By: Orvan Falconer M.D.   On: 05/15/2023 12:57     Labs:   Basic Metabolic Panel: Recent Labs  Lab 05/15/23 0908 05/15/23 0915 05/16/23 0341 05/17/23 0544  NA 138 142 140 140  K 4.4 4.5 4.1 3.6  CL 104 107 111 106  CO2 24  --  22 25  GLUCOSE 141* 135* 97 106*  BUN 14 16 10  7*  CREATININE 1.19* 1.20* 0.93 1.00  CALCIUM 9.0  --  8.2* 8.7*  MG  --   --  1.9 2.0  PHOS  --   --  4.1  --    GFR Estimated Creatinine Clearance: 53 mL/min (by C-G formula based on SCr of 1 mg/dL). Liver Function Tests: Recent Labs  Lab 05/15/23 0908  AST 34  ALT 25  ALKPHOS 68  BILITOT 0.2*  PROT 6.4*  ALBUMIN 3.7   No results for input(s): "LIPASE", "AMYLASE" in the last 168 hours. No results for input(s): "AMMONIA" in the last 168 hours. Coagulation profile No results for input(s): "INR", "PROTIME" in the last 168 hours.  CBC: Recent  Labs  Lab 05/15/23 0908 05/15/23 0915 05/16/23 0341 05/17/23 0544  WBC 4.0  --  4.3 3.2*  NEUTROABS 2.1  --   --   --   HGB 12.3 12.2 10.1* 10.6*  HCT 38.6 36.0 32.1* 33.4*  MCV 100.3*  --  99.4 96.3  PLT 134*  --  115* 117*   Cardiac Enzymes: No results for input(s): "CKTOTAL", "CKMB", "CKMBINDEX", "TROPONINI" in the last 168 hours. BNP: Invalid input(s): "POCBNP" CBG: Recent Labs  Lab 05/15/23 0911  GLUCAP 137*   D-Dimer Recent Labs    05/15/23 0937  DDIMER 1.00*   Hgb A1c No results for input(s): "HGBA1C" in the last 72 hours. Lipid Profile No results for input(s): "CHOL", "HDL", "LDLCALC", "TRIG", "CHOLHDL", "LDLDIRECT" in the last 72 hours. Thyroid function  studies No results for input(s): "TSH", "T4TOTAL", "T3FREE", "THYROIDAB" in the last 72 hours.  Invalid input(s): "FREET3" Anemia work up Recent Labs    05/16/23 0851  [id number]   Microbiology No results found for this or any previous visit (from the past 240 hour(s)).   Discharge Instructions:   Discharge Instructions     Diet general   Complete by: As directed    Discharge instructions   Complete by: As directed    Follow-up with your primary care provider in 1 week.  Check blood work at that time.  Take medication as prescribed.  Increase hydration.  Please wear elastic stocking as best as possible at home.  Take time and precautions while standing from lying down position.  Seek medical attention for worsening symptoms.  Try to decrease the dose of Klonopin and amitriptyline at home if possible.   Increase activity slowly   Complete by: As directed       Allergies as of 05/17/2023       Reactions   Phentermine Anxiety   Augmentin [amoxicillin-pot Clavulanate] Diarrhea        Medication List     STOP taking these medications    oxyBUTYnin Chloride 2.5 MG Tabs       TAKE these medications    acetaminophen 500 MG tablet Commonly known as: TYLENOL Take 2 tablets (1,000 mg total) by mouth every 6 (six) hours as needed.   amitriptyline 150 MG tablet Commonly known as: ELAVIL Take 1 tablet (150 mg total) by mouth at bedtime.   buPROPion 150 MG 24 hr tablet Commonly known as: WELLBUTRIN XL TAKE 1 TABLET BY MOUTH EVERY DAY IN THE MORNING What changed:  how much to take how to take this when to take this additional instructions   CALCIUM CITRATE + D3 PO Take 1 tablet by mouth daily.   clonazePAM 0.5 MG tablet Commonly known as: KlonoPIN Take 1 tablet (0.5 mg total) by mouth at bedtime as needed for anxiety.   diclofenac Sodium 1 % Gel Commonly known as: VOLTAREN APPLY 2 GRAMS TO AFFECTED AREA 4 TIMES A DAY What changed: See the new  instructions.   fluticasone 50 MCG/ACT nasal spray Commonly known as: FLONASE Place 1 spray into both nostrils daily.   ketoconazole 2 % cream Commonly known as: NIZORAL Apply 1 Application topically 2 (two) times daily.   memantine 10 MG tablet Commonly known as: NAMENDA Take 1 tablet (10 mg total) by mouth 2 (two) times daily.   methocarbamol 500 MG tablet Commonly known as: ROBAXIN Take 1 tablet (500 mg total) by mouth every 6 (six) hours as needed for muscle spasms.   midodrine 5 MG tablet Commonly  known as: PROAMATINE Take 1 tablet (5 mg total) by mouth 3 (three) times daily with meals.   MULTIVITAMIN PO Take 1 tablet by mouth daily. Centrum 50 +   nystatin powder Commonly known as: MYCOSTATIN/NYSTOP Apply 1 Application topically 2 (two) times daily as needed (for dry skin).   omeprazole 40 MG capsule Commonly known as: PRILOSEC Take 40 mg by mouth daily.   ondansetron 4 MG disintegrating tablet Commonly known as: Zofran ODT Take 1 tablet (4 mg total) by mouth every 8 (eight) hours as needed for nausea or vomiting.   polycarbophil 625 MG tablet Commonly known as: FIBERCON Take 625 mg by mouth daily. gummy   polyethylene glycol 17 g packet Commonly known as: MIRALAX / GLYCOLAX Take 17 g by mouth every other day.   pregabalin 75 MG capsule Commonly known as: Lyrica Take 1 capsule (75 mg total) by mouth at bedtime.   Probiotic Tbec Take 1 tablet by mouth daily.   QUEtiapine 50 MG tablet Commonly known as: SEROQUEL Take 1 tablet (50 mg total) by mouth at bedtime.   rosuvastatin 10 MG tablet Commonly known as: CRESTOR TAKE 1 TABLET BY MOUTH EVERY DAY   tamsulosin 0.4 MG Caps capsule Commonly known as: FLOMAX Take 0.4 mg by mouth daily.        Follow-up Information     [facility] Follow up.   Specialty: Home Health Services Why: [facility] will be providing you with home health services.  They will call you in the next 24 - 48 hours to  set up home services. Contact information: [address] [place] [address] [phone]                  Time coordinating discharge: 39 minutes  Signed:  Lynnleigh Soden  [facility] 05/17/2023, 12:48 PM

## 2023-05-23 ENCOUNTER — Encounter: Payer: 59 | Attending: Physical Medicine and Rehabilitation | Admitting: Physical Medicine and Rehabilitation

## 2023-05-23 DIAGNOSIS — H538 Other visual disturbances: Secondary | ICD-10-CM | POA: Insufficient documentation

## 2023-05-23 DIAGNOSIS — M7061 Trochanteric bursitis, right hip: Secondary | ICD-10-CM | POA: Insufficient documentation

## 2023-05-23 DIAGNOSIS — G4701 Insomnia due to medical condition: Secondary | ICD-10-CM | POA: Insufficient documentation

## 2023-05-23 DIAGNOSIS — M7062 Trochanteric bursitis, left hip: Secondary | ICD-10-CM | POA: Insufficient documentation

## 2023-05-23 DIAGNOSIS — F411 Generalized anxiety disorder: Secondary | ICD-10-CM | POA: Insufficient documentation

## 2023-06-05 ENCOUNTER — Telehealth: Payer: Self-pay

## 2023-06-05 NOTE — Telephone Encounter (Signed)
Multiple attempts have been made to call Kendra Simmons back. However no answer or voicemail. (Call back phone [phone] or [phone]).

## 2023-06-05 NOTE — Telephone Encounter (Signed)
Please review and advise Dr. Carlis Abbott is out of the office.   Kendra Simmons has been taking the Clonazepam 0.5 mg nightly. Today she only has 7 pills on hand. Patient  reported taking a extra pill from time to time.    She will not be able to pick up a refill for 9 days. Will she need a bridge for the extra 2 days? Also patient will need a new Rx with extra dosing if granted.

## 2023-06-06 NOTE — Telephone Encounter (Signed)
I have attempted twice today to call Kendra Simmons back today. However, I am still not able to leave a message. And she does not answer the phone.

## 2023-06-06 NOTE — Telephone Encounter (Signed)
Appointment scheduled for 06/18/23. Patient notified.

## 2023-06-18 ENCOUNTER — Encounter: Payer: Self-pay | Admitting: Physical Medicine and Rehabilitation

## 2023-06-18 ENCOUNTER — Encounter: Payer: 59 | Attending: Physical Medicine and Rehabilitation | Admitting: Physical Medicine and Rehabilitation

## 2023-06-18 VITALS — BP 138/81 | HR 91 | Ht 64.0 in | Wt 190.0 lb

## 2023-06-18 DIAGNOSIS — E669 Obesity, unspecified: Secondary | ICD-10-CM | POA: Diagnosis present

## 2023-06-18 DIAGNOSIS — G4701 Insomnia due to medical condition: Secondary | ICD-10-CM

## 2023-06-18 DIAGNOSIS — R454 Irritability and anger: Secondary | ICD-10-CM | POA: Diagnosis present

## 2023-06-18 DIAGNOSIS — F411 Generalized anxiety disorder: Secondary | ICD-10-CM

## 2023-06-18 DIAGNOSIS — I951 Orthostatic hypotension: Secondary | ICD-10-CM | POA: Diagnosis present

## 2023-06-18 DIAGNOSIS — M7062 Trochanteric bursitis, left hip: Secondary | ICD-10-CM

## 2023-06-18 DIAGNOSIS — R451 Restlessness and agitation: Secondary | ICD-10-CM | POA: Diagnosis present

## 2023-06-18 DIAGNOSIS — G43E19 Chronic migraine with aura, intractable, without status migrainosus: Secondary | ICD-10-CM | POA: Diagnosis present

## 2023-06-18 DIAGNOSIS — Z6830 Body mass index (BMI) 30.0-30.9, adult: Secondary | ICD-10-CM

## 2023-06-18 MED ORDER — TOPIRAMATE 25 MG PO TABS: 25.0000 mg | ORAL_TABLET | Freq: Every evening | ORAL | 3 refills | Status: DC

## 2023-06-18 MED ORDER — PREGABALIN 50 MG PO CAPS
50.0000 mg | ORAL_CAPSULE | Freq: Every evening | ORAL | 3 refills | Status: DC
Start: 2023-06-18 — End: 2023-12-04

## 2023-06-18 MED ORDER — CLONAZEPAM 0.5 MG PO TABS: 0.5000 mg | ORAL_TABLET | Freq: Every evening | ORAL | 5 refills | Status: DC | PRN
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
## 2023-06-25 ENCOUNTER — Telehealth: Payer: Self-pay

## 2023-06-25 NOTE — Telephone Encounter (Signed)
Pharmacy is [facility] on [address]:  Patient would like to speak to Dr. Carlis Abbott.  Topiramate 25 MG is making her too sleepy.  Patient would like to increase the Clonazepam Also to increase the Diclofenac Gel to 3%.   Call back phone [phone].

## 2023-07-01 ENCOUNTER — Encounter: Payer: 59 | Admitting: Physical Medicine and Rehabilitation

## 2023-07-01 DIAGNOSIS — R451 Restlessness and agitation: Secondary | ICD-10-CM | POA: Diagnosis not present

## 2023-07-01 MED ORDER — CLONAZEPAM 0.5 MG PO TABS: 0.5000 mg | ORAL_TABLET | Freq: Two times a day (BID) | ORAL | 5 refills | Status: DC | PRN
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
## 2023-07-24 ENCOUNTER — Telehealth: Payer: Self-pay | Admitting: Physical Medicine and Rehabilitation

## 2023-07-24 NOTE — Telephone Encounter (Signed)
Patient called in and is requesting advise , patient states she has been having rough days , she had 2 incidents on same day and is feeling overwhelmed , and is asking for advise on increasing medication to help her with her sleep.

## 2023-07-25 ENCOUNTER — Encounter
Payer: Medicare Other | Attending: Physical Medicine and Rehabilitation | Admitting: Physical Medicine and Rehabilitation

## 2023-07-25 DIAGNOSIS — R0602 Shortness of breath: Secondary | ICD-10-CM | POA: Insufficient documentation

## 2023-07-25 DIAGNOSIS — I951 Orthostatic hypotension: Secondary | ICD-10-CM | POA: Insufficient documentation

## 2023-07-25 DIAGNOSIS — G4701 Insomnia due to medical condition: Secondary | ICD-10-CM | POA: Diagnosis not present

## 2023-07-25 DIAGNOSIS — R4189 Other symptoms and signs involving cognitive functions and awareness: Secondary | ICD-10-CM | POA: Diagnosis not present

## 2023-07-25 DIAGNOSIS — F411 Generalized anxiety disorder: Secondary | ICD-10-CM | POA: Insufficient documentation

## 2023-07-25 MED ORDER — TOPIRAMATE 25 MG PO TABS: 50.0000 mg | ORAL_TABLET | Freq: Every evening | ORAL | 3 refills | Status: DC
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
## 2023-08-08 ENCOUNTER — Encounter (HOSPITAL_BASED_OUTPATIENT_CLINIC_OR_DEPARTMENT_OTHER): Payer: Medicare Other | Admitting: Physical Medicine and Rehabilitation

## 2023-08-08 ENCOUNTER — Telehealth: Payer: Self-pay

## 2023-08-08 DIAGNOSIS — R0602 Shortness of breath: Secondary | ICD-10-CM

## 2023-08-08 DIAGNOSIS — I951 Orthostatic hypotension: Secondary | ICD-10-CM

## 2023-08-08 DIAGNOSIS — G4701 Insomnia due to medical condition: Secondary | ICD-10-CM

## 2023-08-08 MED ORDER — CLONAZEPAM 0.25 MG PO TBDP: 0.2500 mg | ORAL_TABLET | Freq: Every evening | ORAL | 0 refills | Status: DC | PRN

## 2023-08-08 NOTE — Telephone Encounter (Signed)
Please call Kendra Simmons. Patient stated she has been taking her medication as discussed with you. She complains of near syncope while taking medications.   Patient is at home alone and stated she is okay. Please give her a call on [phone] [facility])  [phone] [facility] Phone)   She wants to discuss the medications.
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
## 2023-08-12 ENCOUNTER — Telehealth: Payer: Self-pay | Admitting: Physical Medicine and Rehabilitation

## 2023-08-12 ENCOUNTER — Other Ambulatory Visit: Payer: Self-pay | Admitting: Physical Medicine and Rehabilitation

## 2023-08-12 ENCOUNTER — Encounter (HOSPITAL_BASED_OUTPATIENT_CLINIC_OR_DEPARTMENT_OTHER): Payer: Medicare Other | Admitting: Physical Medicine and Rehabilitation

## 2023-08-12 DIAGNOSIS — G4701 Insomnia due to medical condition: Secondary | ICD-10-CM

## 2023-08-12 MED ORDER — CLONAZEPAM 0.25 MG PO TBDP: 0.2500 mg | ORAL_TABLET | Freq: Every evening | ORAL | 0 refills | Status: DC | PRN

## 2023-08-12 NOTE — Telephone Encounter (Signed)
Patient needs to speak to provider regarding medications, following up conversation from last week. She has been taking the clonazepam and Topamax. Last night she took an extra topamax and slept well. She also might need more refills.   Please call patient and further advise
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
## 2023-08-13 ENCOUNTER — Telehealth: Payer: Self-pay

## 2023-08-13 ENCOUNTER — Other Ambulatory Visit: Payer: 59

## 2023-08-13 NOTE — Telephone Encounter (Signed)
Kendra Simmons has requested a call back. Per patient she does not know what to do about her medications. Patient would like you to take over her medications including the Psych medications. Please call her back [phone].

## 2023-08-14 ENCOUNTER — Encounter (HOSPITAL_BASED_OUTPATIENT_CLINIC_OR_DEPARTMENT_OTHER): Payer: Medicare Other | Admitting: Physical Medicine and Rehabilitation

## 2023-08-14 DIAGNOSIS — F411 Generalized anxiety disorder: Secondary | ICD-10-CM | POA: Diagnosis not present
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
## 2023-08-21 ENCOUNTER — Encounter
Payer: Medicare Other | Attending: Physical Medicine and Rehabilitation | Admitting: Physical Medicine and Rehabilitation

## 2023-08-21 ENCOUNTER — Telehealth: Payer: Self-pay | Admitting: *Deleted

## 2023-08-21 DIAGNOSIS — R0602 Shortness of breath: Secondary | ICD-10-CM | POA: Diagnosis not present

## 2023-08-21 DIAGNOSIS — F411 Generalized anxiety disorder: Secondary | ICD-10-CM | POA: Insufficient documentation

## 2023-08-21 MED ORDER — CLONAZEPAM 0.5 MG PO TABS: 0.5000 mg | ORAL_TABLET | Freq: Every day | ORAL | 3 refills | Status: DC | PRN
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
## 2023-08-21 NOTE — Telephone Encounter (Signed)
Kendra Simmons is calling about her clonazepam (she insists it is conzapam) and says [facility] "keeps screwing up the dose" . She says she has been on 0,5mg  forever and that is what she takes. I told her we have 0.25mg  in [facility] and she still argued with me that is not what she takes. I told her I understand what she ways she takes, but what we have down she is to take is 0.25 mg. She is insisting that Dr Carlis Abbott call her about this problem.

## 2023-08-21 NOTE — Addendum Note (Signed)
Addended by: Horton Chin on: 08/21/2023 10:50 AM   Modules accepted: Level of Service

## 2023-09-23 ENCOUNTER — Encounter
Payer: Medicare Other | Attending: Physical Medicine and Rehabilitation | Admitting: Physical Medicine and Rehabilitation

## 2023-09-23 DIAGNOSIS — F411 Generalized anxiety disorder: Secondary | ICD-10-CM | POA: Insufficient documentation

## 2023-10-07 ENCOUNTER — Telehealth: Payer: Self-pay

## 2023-10-07 NOTE — Telephone Encounter (Signed)
Kendra Simmons called: She is requesting to speak to Dr. Carlis Abbott about her Clonazepam. and another medication. She did not leave the other medication name or the questions.   Patient has been called back multiple times for more information. However there is no answer or voicemail available.   (Call back phone [phone], [phone])

## 2023-10-08 ENCOUNTER — Encounter (HOSPITAL_BASED_OUTPATIENT_CLINIC_OR_DEPARTMENT_OTHER): Payer: Medicare Other | Admitting: Physical Medicine and Rehabilitation

## 2023-10-08 DIAGNOSIS — F411 Generalized anxiety disorder: Secondary | ICD-10-CM | POA: Diagnosis not present

## 2023-10-08 MED ORDER — CLONAZEPAM 0.5 MG PO TABS: 0.5000 mg | ORAL_TABLET | Freq: Every day | ORAL | 3 refills | Status: DC | PRN
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
## 2023-10-24 ENCOUNTER — Telehealth: Payer: Self-pay

## 2023-10-24 NOTE — Telephone Encounter (Signed)
Kendra Simmons called and left a message stating she is sick. And would like to speak to Dr. Carlis Abbott. No more details.   Patient has been called back for more details 5 times. No answer and mailbox is full.   [phone] & [phone].

## 2023-10-28 ENCOUNTER — Encounter
Payer: Medicare Other | Attending: Physical Medicine and Rehabilitation | Admitting: Physical Medicine and Rehabilitation

## 2023-10-28 DIAGNOSIS — F411 Generalized anxiety disorder: Secondary | ICD-10-CM | POA: Insufficient documentation

## 2023-10-30 ENCOUNTER — Telehealth: Payer: Self-pay

## 2023-10-30 NOTE — Telephone Encounter (Signed)
Patient called asking for a return call. She is having a problem with Clonazepam.

## 2023-10-31 IMAGING — MR MR HEAD WO/W CM
13 series · 48 of 48 positions shown · IV contrast (multihance)
Comparison: Head CT 07/03/2015 and MRI 08/15/2011

CLINICAL DATA: Altered gait. Altered sensorium. Frequent falls.
Difficulty talking. Hand tremors.

EXAM:
MRI HEAD WITHOUT AND WITH CONTRAST
TECHNIQUE: Multiplanar, multiecho pulse sequences of the brain and surrounding
structures were obtained without and with intravenous contrast.
CONTRAST:  18mL MULTIHANCE GADOBENATE DIMEGLUMINE 529 MG/ML IV SOLN

[Series 5: T1 · sagittal · 4.0mm · 0.75mm/px · 2 of 29 slices shown (1 of 3)]
[im 1/29]
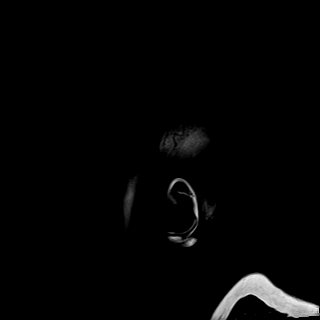
[im 29/29]
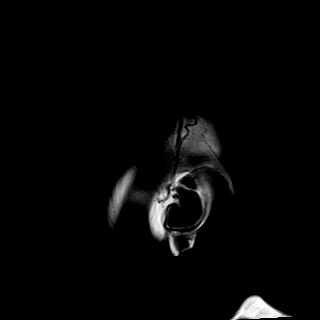

[Series 6: DWI · axial · 3.0mm · 0.94mm/px · z∈[-75,+78]mm · 9 of 175 slices shown (1 of 3)]
[im 1/175]
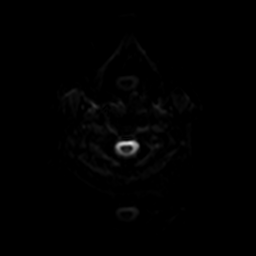
[im 22/175]
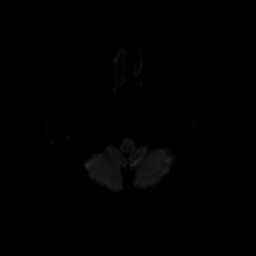
[im 44/175]
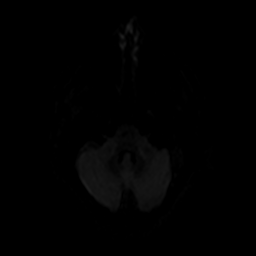
[im 66/175]
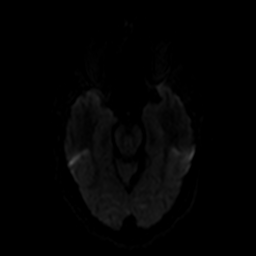
[im 88/175]
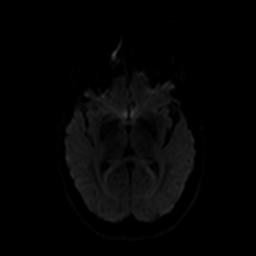
[im 109/175]
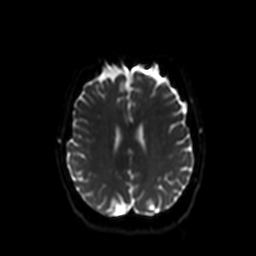
[im 131/175]
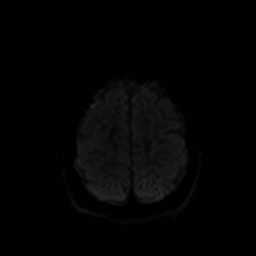
[im 153/175]
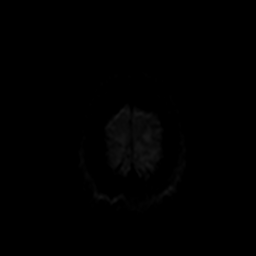
[im 175/175]
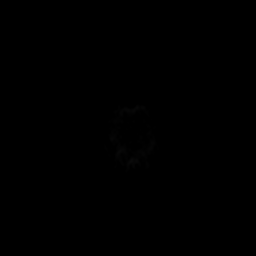

[Series 7: ax dwi_tracew · axial · 3.0mm · 0.94mm/px · z∈[-75,+78]mm · 4 of 87 slices shown]
[im 1/87]
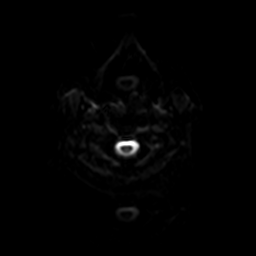
[im 29/87]
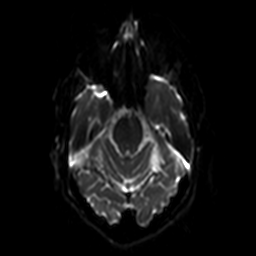
[im 58/87]
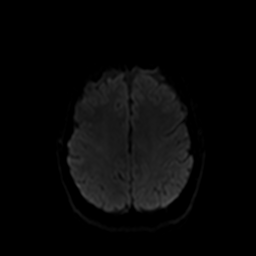
[im 87/87]
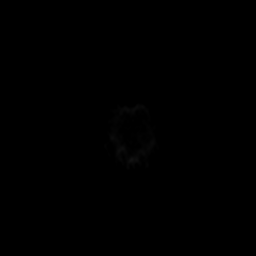

[Series 8: ax dwi_adc · axial · 3.0mm · 0.94mm/px · z∈[-75,+78]mm · 2 of 44 slices shown]
[im 1/44]
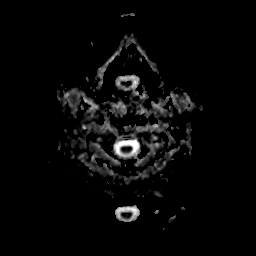
[im 44/44]
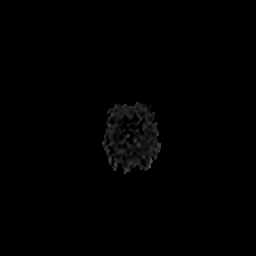

[Series 9: DWI · coronal · 5.0mm · 1.44mm/px · 3 of 62 slices shown (2 of 3)]
[im 1/62]
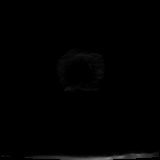
[im 31/62]
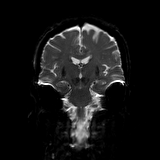
[im 62/62]
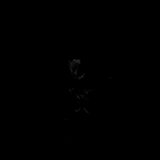

[Series 10: DWI · coronal · 5.0mm · 1.44mm/px · 1 of 30 slices shown (3 of 3)]
[im 1/30]
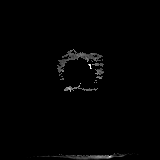

[Series 11: T2 · axial · 4.0mm · 0.36mm/px · 1 of 30 slices shown]
[im 1/30]
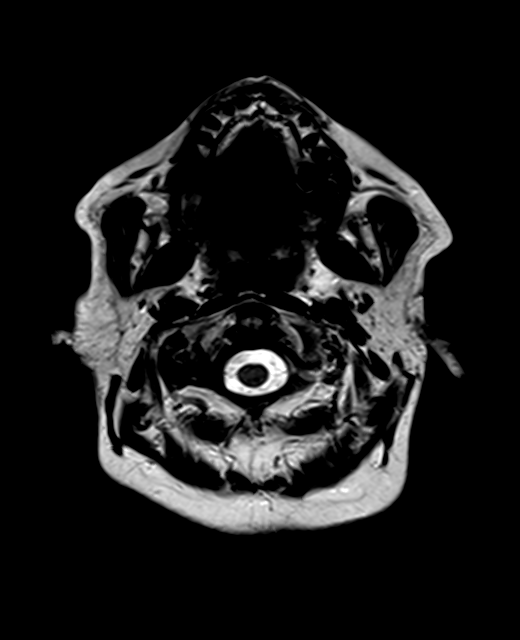

[Series 12: FLAIR · axial · 3.0mm · 0.72mm/px · 1 of 26 slices shown]
[im 1/26]
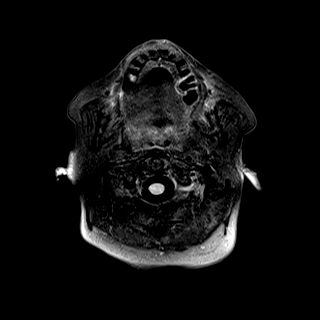

[Series 13: swi_images · axial · 1.5mm · 0.90mm/px · z∈[-79,+74]mm · 5 of 104 slices shown]
[im 1/104]
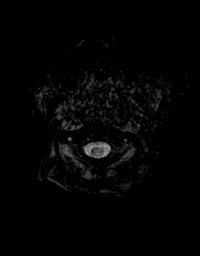
[im 26/104]
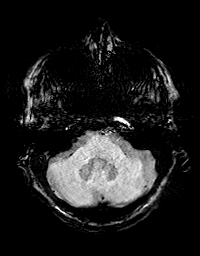
[im 52/104]
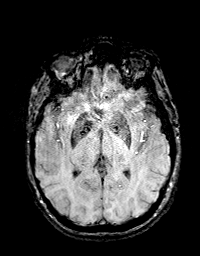
[im 78/104]
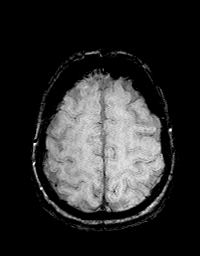
[im 104/104]
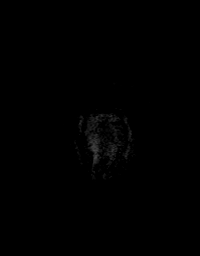

[Series 15: T1 · axial · 1.0mm · 0.94mm/px · z∈[-75,+82]mm · 8 of 160 slices shown (2 of 3)]
[im 1/160]
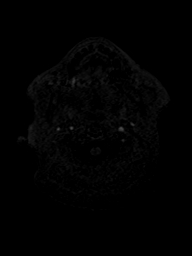
[im 23/160]
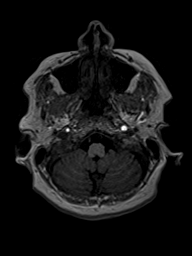
[im 46/160]
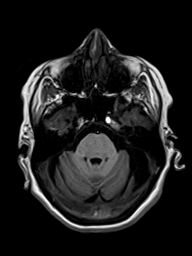
[im 69/160]
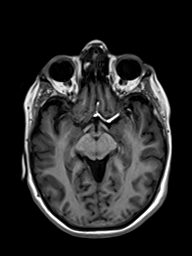
[im 91/160]
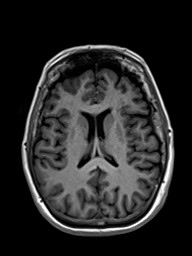
[im 114/160]
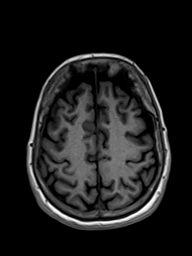
[im 137/160]
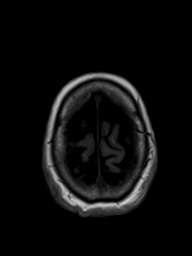
[im 160/160]
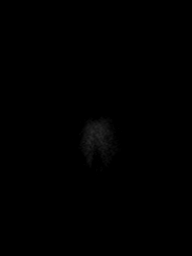

[Series 16: T2 post-contrast · coronal · 4.0mm · 0.36mm/px · 2 of 32 slices shown]
[im 1/32]
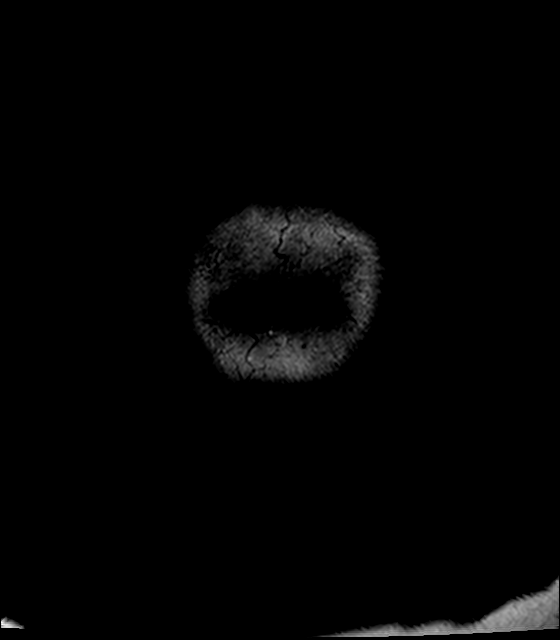
[im 32/32]
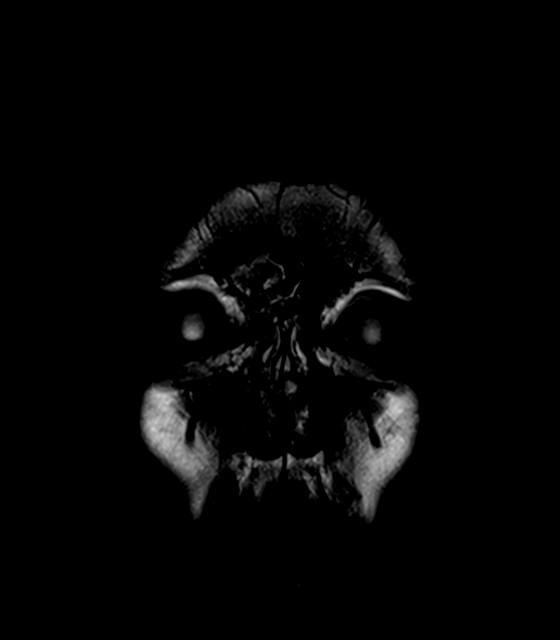

[Series 17: T1 · axial · 1.0mm · 0.94mm/px · z∈[-75,+82]mm · 8 of 160 slices shown (3 of 3)]
[im 1/160]
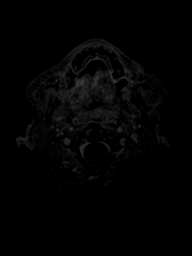
[im 23/160]
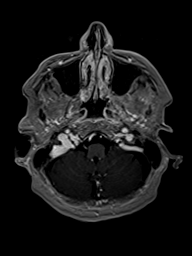
[im 46/160]
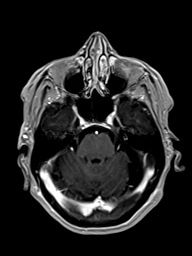
[im 69/160]
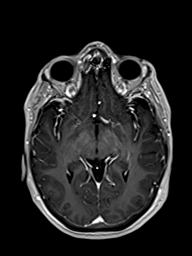
[im 91/160]
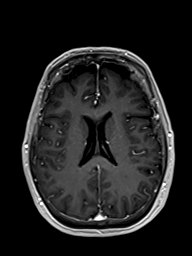
[im 114/160]
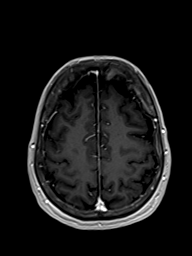
[im 137/160]
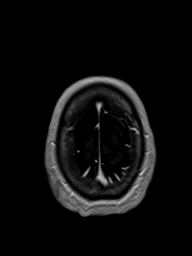
[im 160/160]
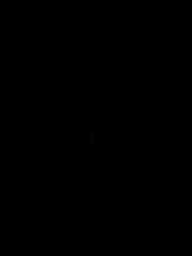

[Series 18: T1 post-contrast · coronal · 4.0mm · 0.72mm/px · 2 of 32 slices shown]
[im 1/32]
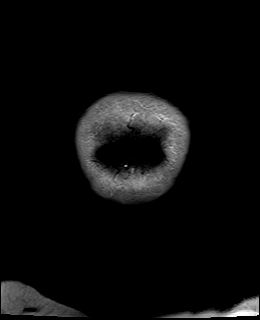
[im 32/32]
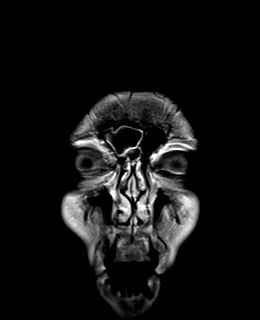

[48 of 48 positions shown; findings below may reference images not displayed]

FINDINGS: Brain: There is no evidence of an acute infarct, intracranial
hemorrhage, mass, midline shift, or extra-axial fluid collection.
There is mild cerebral atrophy with extra-axial CSF space
enlargement most notable over the frontal lobes. The ventricles are
normal in size. Small T2 hyperintensities in the cerebral white
matter bilaterally have mildly progressed from 0730 and are
nonspecific but compatible with mild chronic small vessel ischemic
disease. No abnormal enhancement is identified. Mildly asymmetric
extra-axial CSF lateral to the left cerebellar hemisphere is chronic
and similar to the prior MRI.

Vascular: Major intracranial vascular flow voids are preserved.

Skull and upper cervical spine: Unremarkable bone marrow signal para

Sinuses/Orbits: Unremarkable orbits. Mild mucosal thickening in the
frontal and ethmoid sinuses. Clear mastoid air cells.

Other: None.
IMPRESSION: 1. No acute intracranial abnormality.
2. Mild chronic small vessel ischemic disease, mildly progressed

## 2023-11-05 ENCOUNTER — Encounter: Payer: Medicare Other | Admitting: Physical Medicine and Rehabilitation

## 2023-11-25 DIAGNOSIS — Z743 Need for continuous supervision: Secondary | ICD-10-CM | POA: Diagnosis not present

## 2023-11-25 DIAGNOSIS — R69 Illness, unspecified: Secondary | ICD-10-CM | POA: Diagnosis not present

## 2023-12-18 DEATH — deceased

## 2024-01-24 ENCOUNTER — Other Ambulatory Visit: Payer: Self-pay | Admitting: Physical Medicine and Rehabilitation
